# Patient Record
Sex: Male | Born: 1935
Health system: Southern US, Community
[De-identification: ages and names within clinical notes are randomized; demographics above are authoritative.]

## PROBLEM LIST (undated history)

## (undated) ENCOUNTER — Emergency Department (HOSPITAL_COMMUNITY): Payer: PRIVATE HEALTH INSURANCE | Source: Home / Self Care

## (undated) DIAGNOSIS — I519 Heart disease, unspecified: Secondary | ICD-10-CM

## (undated) DIAGNOSIS — I951 Orthostatic hypotension: Secondary | ICD-10-CM

## (undated) DIAGNOSIS — I5189 Other ill-defined heart diseases: Secondary | ICD-10-CM

## (undated) DIAGNOSIS — E7800 Pure hypercholesterolemia, unspecified: Secondary | ICD-10-CM

## (undated) DIAGNOSIS — B353 Tinea pedis: Secondary | ICD-10-CM

## (undated) DIAGNOSIS — Z9689 Presence of other specified functional implants: Secondary | ICD-10-CM

## (undated) DIAGNOSIS — R3915 Urgency of urination: Secondary | ICD-10-CM

## (undated) DIAGNOSIS — L111 Transient acantholytic dermatosis [Grover]: Secondary | ICD-10-CM

## (undated) DIAGNOSIS — D075 Carcinoma in situ of prostate: Secondary | ICD-10-CM

## (undated) DIAGNOSIS — R269 Unspecified abnormalities of gait and mobility: Secondary | ICD-10-CM

## (undated) DIAGNOSIS — I4891 Unspecified atrial fibrillation: Secondary | ICD-10-CM

## (undated) DIAGNOSIS — H409 Unspecified glaucoma: Secondary | ICD-10-CM

## (undated) DIAGNOSIS — R569 Unspecified convulsions: Secondary | ICD-10-CM

## (undated) DIAGNOSIS — G9341 Metabolic encephalopathy: Secondary | ICD-10-CM

## (undated) DIAGNOSIS — R972 Elevated prostate specific antigen [PSA]: Secondary | ICD-10-CM

## (undated) DIAGNOSIS — E785 Hyperlipidemia, unspecified: Secondary | ICD-10-CM

## (undated) DIAGNOSIS — I469 Cardiac arrest, cause unspecified: Secondary | ICD-10-CM

## (undated) DIAGNOSIS — G939 Disorder of brain, unspecified: Secondary | ICD-10-CM

## (undated) DIAGNOSIS — H5440 Blindness, one eye, unspecified eye: Secondary | ICD-10-CM

## (undated) HISTORY — DX: Orthostatic hypotension: I95.1

## (undated) HISTORY — DX: Unspecified atrial fibrillation: I48.91

## (undated) HISTORY — DX: Urgency of urination: R39.15

## (undated) HISTORY — PX: NASAL SINUS SURGERY: SHX719

## (undated) HISTORY — PX: CRANIOTOMY: SHX93

## (undated) HISTORY — PX: OTHER SURGICAL HISTORY: SHX169

## (undated) HISTORY — PX: COLONOSCOPY: SHX174

## (undated) HISTORY — DX: Elevated prostate specific antigen (PSA): R97.20

## (undated) HISTORY — PX: CARDIAC DEFIBRILLATOR PLACEMENT: SHX171

## (undated) HISTORY — DX: Other ill-defined heart diseases: I51.89

## (undated) HISTORY — DX: Tinea pedis: B35.3

## (undated) HISTORY — PX: TONSILLECTOMY: SUR1361

## (undated) HISTORY — DX: Unspecified abnormalities of gait and mobility: R26.9

## (undated) HISTORY — DX: Heart disease, unspecified: I51.9

## (undated) HISTORY — DX: 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grover): L11.1

## (undated) HISTORY — DX: Presence of other specified functional implants: Z96.89

## (undated) HISTORY — DX: Cardiac arrest, cause unspecified: I46.9

## (undated) HISTORY — DX: Carcinoma in situ of prostate: D07.5

## (undated) HISTORY — DX: Hyperlipidemia, unspecified: E78.5

## (undated) HISTORY — DX: Metabolic encephalopathy: G93.41

---
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
## 1983-06-18 HISTORY — PX: VASECTOMY: SHX75

## 2005-03-31 ENCOUNTER — Emergency Department (HOSPITAL_COMMUNITY): Admission: EM | Admit: 2005-03-31 | Discharge: 2005-03-31 | Payer: Self-pay | Admitting: Emergency Medicine

## 2008-10-27 ENCOUNTER — Telehealth (INDEPENDENT_AMBULATORY_CARE_PROVIDER_SITE_OTHER): Payer: Self-pay | Admitting: *Deleted

## 2008-10-28 ENCOUNTER — Ambulatory Visit: Payer: Self-pay

## 2008-10-28 ENCOUNTER — Encounter: Payer: Self-pay | Admitting: Cardiology

## 2010-05-11 ENCOUNTER — Emergency Department (HOSPITAL_COMMUNITY): Admission: EM | Admit: 2010-05-11 | Discharge: 2010-05-11 | Payer: Self-pay | Admitting: Family Medicine

## 2010-08-01 ENCOUNTER — Institutional Professional Consult (permissible substitution): Payer: Self-pay | Admitting: Pediatrics

## 2010-11-02 NOTE — Consult Note (Signed)
NAMEEZEL, VALLONE NO.:  [id number]   MEDICAL RECORD NO.:  [id number]          PATIENT TYPE:  EMS   LOCATION:  MAJO                         FACILITY:  [facility]   PHYSICIAN:  Melvyn Novas, M.D.  DATE OF BIRTH:  05/22/1936   DATE OF CONSULTATION:  03/31/2005  DATE OF DISCHARGE:                                   CONSULTATION   Mr. Halford Goetzke is a 75 year old Caucasian, right-handed gentleman, living in  [place], who also has a secondary residence here in [place].  This  morning he was exercising with two of his friends, Dr. Glennon Hamilton and Dr.  Eloise Harman, and suddenly became confused and acted not quite himself.  He was  also diaphoretic, and the confusional spell was described as lasting 20 to  30 minutes.  There was no convulsion observed, no loss of consciousness, no  incontinence, and no tongue bite.  Mr. Homewood relatives were able to pick  him up and brought him to the ER after talking to the two physician friends  that highly recommended a brief workup.   Here in the ER, he received a CT scan of the head which shows an apparently  not new lesion in the right frontal lobe.  This lesion was diagnosed 12  years ago, and according to the patient, has been followed at [facility] in [place] by Dr. Darliss Cheney and a neurosurgeon, Dr. Riley Kill.  He is currently in the process of being evaluated for seizure surgery.  There is no edema and no evidence of any acute anatomical changes seen on  this CT scan.  The patient further had a CHEM-7 drawn which was normal.  Creatinine 0.9, sodium 136, potassium 4.9.  A phenytoin level came back, and  the patient, who had taken Dilantin around 8 a.m., and his blood was taken  about 3 hours later, had a blood level of 27.4 mcg/mL which is high.  He  then recalled that at least once before he had seizures when his phenytoin  level reached 30.   PHYSICAL EXAMINATION:  The patient is alert and oriented now.  He  does,  however, insist that he wants to fly to [facility], home, at 5 p.m. today  which I strongly discouraged.  He will fly in steps tomorrow and tonight we  will make a short adjustment in his medications.  He has apparently taken  for a couple of days extra Dilantin since he senses that seizures occur more  often when he flies.  He, therefore, prepped himself for the stress coming  with the air travel and might have actually overdosed in the last 3 days.  He is currently, he states, on 2 Keppra tablets in the morning and at night.  He is not sure about the absolute dose.  He talked about blue Keppra tablets  that he takes 2 of twice daily which might be a 500 mg dose twice a day.  He  takes Dilantin 200 mg and morning and 250 mg at night, and he takes Vitorin.  He  is on no other medications.   FAMILY HISTORY:  Negative for seizures, degenerative disease, and vascular  diseases.   SOCIAL HISTORY:  The patient is a nonsmoker, nondrinker.   ASSESSMENT:  The patient's cranial nerve examination is normal.  He has full  extraocular movements.  He had a brief nystagmus with gaze to the right of 3  beats, but this is an end-point nystagmus.  He has full visual fields  bilaterally to simultaneous stimulation.  Facial droop is not seen.  There  is symmetric facial strength.  Tongue and uvula midline.  Range of motion  for the neck is full, and the patient has no tenderness over head and skull.  As to his motor examination, he has equal strength bilaterally.  His left  reflexes seem to be slightly brisker than the deep tendon reflexes on the  right, but he has bilaterally downgoing toes to plantar stimulation.  His  primary sensory for fine touch, pinprick, and vibration was intact.  He  could also feel the cold metal.   Gait and finger-to-nose test are intact.  He shows no tremor nor ataxia nor  dysmetria.   The patient might have suffered a frontal lobe complex partial seizure this   morning, and this event is apparently not unusual for him.  He has a known  underlying brain lesion.  He is in the processs of being evaluated by his  [facility] physicians, and I encouraged him to fly back tomorrow instead  of today.  I would like him to continue his Keppra regimen.  His Dilantin I  will ask him to take today at only 200 mg at night, and he will resume 200  mg in the morning.  I will be happy to provide a copy of today's report, the  lab reports, and CD ROM of his head CT.           ______________________________  Melvyn Novas, M.D.     CD/MEDQ  D:  03/31/2005  T:  03/31/2005  Job:  [id number]   cc:   Genene Churn. Love, M.D.  Fax: [phone]   E. Graceann Congress, M.D.  [address]  [address]  [place]  [place] [address]   Darliss Cheney, M.D.  [facility]  [facility]  [place], [place]

## 2011-12-04 DIAGNOSIS — M8580 Other specified disorders of bone density and structure, unspecified site: Secondary | ICD-10-CM | POA: Insufficient documentation

## 2011-12-04 DIAGNOSIS — E559 Vitamin D deficiency, unspecified: Secondary | ICD-10-CM | POA: Insufficient documentation

## 2013-04-10 ENCOUNTER — Emergency Department (HOSPITAL_COMMUNITY): Payer: Medicare Other

## 2013-04-10 ENCOUNTER — Inpatient Hospital Stay (HOSPITAL_COMMUNITY)
Admission: EM | Admit: 2013-04-10 | Discharge: 2013-04-11 | DRG: 101 | Disposition: A | Discharge status: Home / Self Care | Payer: Medicare Other | Attending: Infectious Diseases | Admitting: Infectious Diseases

## 2013-04-10 ENCOUNTER — Encounter (HOSPITAL_COMMUNITY): Payer: Self-pay | Admitting: Emergency Medicine

## 2013-04-10 ENCOUNTER — Inpatient Hospital Stay (HOSPITAL_COMMUNITY)
Admit: 2013-04-10 | Discharge: 2013-04-10 | Disposition: A | Discharge status: Home / Self Care | Payer: Medicare Other | Attending: Neurology | Admitting: Neurology

## 2013-04-10 DIAGNOSIS — R569 Unspecified convulsions: Secondary | ICD-10-CM

## 2013-04-10 DIAGNOSIS — G40209 Localization-related (focal) (partial) symptomatic epilepsy and epileptic syndromes with complex partial seizures, not intractable, without status epilepticus: Secondary | ICD-10-CM | POA: Diagnosis present

## 2013-04-10 DIAGNOSIS — E785 Hyperlipidemia, unspecified: Secondary | ICD-10-CM | POA: Diagnosis present

## 2013-04-10 DIAGNOSIS — Z79899 Other long term (current) drug therapy: Secondary | ICD-10-CM

## 2013-04-10 DIAGNOSIS — G40909 Epilepsy, unspecified, not intractable, without status epilepticus: Principal | ICD-10-CM | POA: Diagnosis present

## 2013-04-10 DIAGNOSIS — H409 Unspecified glaucoma: Secondary | ICD-10-CM | POA: Diagnosis present

## 2013-04-10 DIAGNOSIS — Z87891 Personal history of nicotine dependence: Secondary | ICD-10-CM

## 2013-04-10 DIAGNOSIS — G40901 Epilepsy, unspecified, not intractable, with status epilepticus: Secondary | ICD-10-CM

## 2013-04-10 HISTORY — DX: Blindness, one eye, unspecified eye: H54.40

## 2013-04-10 HISTORY — DX: Disorder of brain, unspecified: G93.9

## 2013-04-10 HISTORY — DX: Pure hypercholesterolemia, unspecified: E78.00

## 2013-04-10 HISTORY — DX: Unspecified glaucoma: H40.9

## 2013-04-10 HISTORY — DX: Unspecified convulsions: R56.9

## 2013-04-10 LAB — BASIC METABOLIC PANEL
BUN: 16 mg/dL (ref 6–23)
CO2: 22 mEq/L (ref 19–32)
Calcium: 9.2 mg/dL (ref 8.4–10.5)
Chloride: 101 mEq/L (ref 96–112)
Creatinine, Ser: 0.87 mg/dL (ref 0.50–1.35)
GFR calc Af Amer: >90 mL/min (ref >90)
GFR calc non Af Amer: 82 mL/min — ABNORMAL LOW (ref >90)
Glucose, Bld: 103 mg/dL — ABNORMAL HIGH (ref 70–99)
Potassium: 4.7 mEq/L (ref 3.5–5.1)
Sodium: 138 mEq/L (ref 135–145)

## 2013-04-10 LAB — GLUCOSE, CAPILLARY: Glucose-Capillary: 96 mg/dL (ref 70–99)

## 2013-04-10 LAB — CBC
HCT: 37.5 % — ABNORMAL LOW (ref 39.0–52.0)
Hemoglobin: 13.8 g/dL (ref 13.0–17.0)
MCHC: 36.8 g/dL — ABNORMAL HIGH (ref 30.0–36.0)
MCV: 90.6 fL (ref 78.0–100.0)
RBC: 4.14 MIL/uL — ABNORMAL LOW (ref 4.22–5.81)
RDW: 12.3 % (ref 11.5–15.5)

## 2013-04-10 LAB — URINALYSIS, ROUTINE W REFLEX MICROSCOPIC
Bilirubin Urine: NEGATIVE
Glucose, UA: NEGATIVE mg/dL
Hgb urine dipstick: NEGATIVE
Ketones, ur: NEGATIVE mg/dL
Leukocytes, UA: NEGATIVE
Nitrite: NEGATIVE
Protein, ur: NEGATIVE mg/dL
Specific Gravity, Urine: 1.021 (ref 1.005–1.030)
Urobilinogen, UA: 0.2 mg/dL (ref 0.0–1.0)
pH: 6 (ref 5.0–8.0)

## 2013-04-10 LAB — CBC WITH DIFFERENTIAL/PLATELET
Basophils Absolute: 0.1 10*3/uL (ref 0.0–0.1)
Basophils Relative: 1 % (ref 0–1)
Eosinophils Absolute: 0.5 10*3/uL (ref 0.0–0.7)
Eosinophils Relative: 6 % — ABNORMAL HIGH (ref 0–5)
HCT: 40.9 % (ref 39.0–52.0)
Hemoglobin: 14.8 g/dL (ref 13.0–17.0)
Lymphocytes Relative: 49 % — ABNORMAL HIGH (ref 12–46)
Lymphs Abs: 4.4 10*3/uL — ABNORMAL HIGH (ref 0.7–4.0)
MCH: 32.7 pg (ref 26.0–34.0)
MCHC: 36.2 g/dL — ABNORMAL HIGH (ref 30.0–36.0)
MCV: 90.5 fL (ref 78.0–100.0)
Monocytes Absolute: 1.1 10*3/uL — ABNORMAL HIGH (ref 0.1–1.0)
Monocytes Relative: 12 % (ref 3–12)
Neutro Abs: 3 10*3/uL (ref 1.7–7.7)
Neutrophils Relative %: 33 % — ABNORMAL LOW (ref 43–77)
Platelets: 189 10*3/uL (ref 150–400)
RBC: 4.52 MIL/uL (ref 4.22–5.81)
RDW: 12.3 % (ref 11.5–15.5)
WBC: 9.1 10*3/uL (ref 4.0–10.5)

## 2013-04-10 LAB — CREATININE, SERUM
GFR calc Af Amer: >90 mL/min (ref >90)
GFR calc non Af Amer: 90 mL/min — ABNORMAL LOW (ref >90)

## 2013-04-10 LAB — PHENYTOIN LEVEL, TOTAL: Phenytoin Lvl: 13.5 ug/mL (ref 10.0–20.0)

## 2013-04-10 LAB — RAPID URINE DRUG SCREEN, HOSP PERFORMED
Amphetamines: NOT DETECTED
Barbiturates: NOT DETECTED
Benzodiazepines: POSITIVE — AB
Cocaine: NOT DETECTED
Opiates: NOT DETECTED
Tetrahydrocannabinol: NOT DETECTED

## 2013-04-10 LAB — MAGNESIUM: Magnesium: 2.1 mg/dL (ref 1.5–2.5)

## 2013-04-10 MED ORDER — ALPRAZOLAM 0.5 MG PO TABS
0.5000 mg | ORAL_TABLET | Freq: Two times a day (BID) | ORAL | Status: DC
  Administered 2013-04-10 – 2013-04-11 (×2): 0.5 mg via ORAL
  Filled 2013-04-10 (×2): qty 1

## 2013-04-10 MED ORDER — FINASTERIDE 5 MG PO TABS
5.0000 mg | ORAL_TABLET | Freq: Every day | ORAL | Status: DC
  Administered 2013-04-10: 5 mg via ORAL
  Filled 2013-04-10 (×2): qty 1

## 2013-04-10 MED ORDER — LEVETIRACETAM 750 MG PO TABS
1500.0000 mg | ORAL_TABLET | Freq: Two times a day (BID) | ORAL | Status: DC
  Administered 2013-04-11: 1500 mg via ORAL
  Filled 2013-04-10 (×3): qty 2

## 2013-04-10 MED ORDER — LACOSAMIDE 200 MG PO TABS
100.0000 mg | ORAL_TABLET | Freq: Two times a day (BID) | ORAL | Status: DC
  Administered 2013-04-11: 100 mg via ORAL
  Filled 2013-04-10: qty 1

## 2013-04-10 MED ORDER — LATANOPROST 0.005 % OP SOLN
1.0000 [drp] | Freq: Every day | OPHTHALMIC | Status: DC
  Filled 2013-04-10: qty 2.5

## 2013-04-10 MED ORDER — BRIMONIDINE TARTRATE 0.2 % OP SOLN
1.0000 [drp] | Freq: Two times a day (BID) | OPHTHALMIC | Status: DC
  Administered 2013-04-10 – 2013-04-11 (×3): 1 [drp] via OPHTHALMIC
  Filled 2013-04-10: qty 5

## 2013-04-10 MED ORDER — SODIUM CHLORIDE 0.9 % IV BOLUS (SEPSIS)
1000.0000 mL | Freq: Once | INTRAVENOUS | Status: AC
  Administered 2013-04-10: 1000 mL via INTRAVENOUS

## 2013-04-10 MED ORDER — SODIUM CHLORIDE 0.9 % IV SOLN
200.0000 mg | Freq: Once | INTRAVENOUS | Status: DC
  Filled 2013-04-10: qty 20

## 2013-04-10 MED ORDER — ATORVASTATIN CALCIUM 40 MG PO TABS
40.0000 mg | ORAL_TABLET | Freq: Every day | ORAL | Status: DC
  Administered 2013-04-10: 40 mg via ORAL
  Filled 2013-04-10 (×2): qty 1

## 2013-04-10 MED ORDER — ACETAMINOPHEN 325 MG PO TABS: 650.0000 mg | ORAL_TABLET | Freq: Four times a day (QID) | ORAL | Status: DC | PRN

## 2013-04-10 MED ORDER — ENOXAPARIN SODIUM 40 MG/0.4ML ~~LOC~~ SOLN
40.0000 mg | SUBCUTANEOUS | Status: DC
  Administered 2013-04-10: 40 mg via SUBCUTANEOUS
  Filled 2013-04-10 (×2): qty 0.4

## 2013-04-10 MED ORDER — LORAZEPAM 2 MG/ML IJ SOLN
INTRAMUSCULAR | Status: AC
  Administered 2013-04-10: 2 mg
  Filled 2013-04-10: qty 2

## 2013-04-10 MED ORDER — SODIUM CHLORIDE 0.9 % IJ SOLN
3.0000 mL | Freq: Two times a day (BID) | INTRAMUSCULAR | Status: DC
  Administered 2013-04-10 – 2013-04-11 (×2): 3 mL via INTRAVENOUS

## 2013-04-10 MED ORDER — ACETAMINOPHEN 650 MG RE SUPP: 650.0000 mg | Freq: Four times a day (QID) | RECTAL | Status: DC | PRN

## 2013-04-10 MED ORDER — BRIMONIDINE TARTRATE-TIMOLOL 0.2-0.5 % OP SOLN: 1.0000 [drp] | Freq: Two times a day (BID) | OPHTHALMIC | Status: DC

## 2013-04-10 MED ORDER — SODIUM CHLORIDE 0.9 % IV SOLN
1500.0000 mg | Freq: Two times a day (BID) | INTRAVENOUS | Status: DC
  Administered 2013-04-10: 1500 mg via INTRAVENOUS
  Filled 2013-04-10 (×2): qty 15

## 2013-04-10 MED ORDER — SODIUM CHLORIDE 0.9 % IV SOLN
200.0000 mg | Freq: Two times a day (BID) | INTRAVENOUS | Status: DC
  Administered 2013-04-10: 200 mg via INTRAVENOUS
  Filled 2013-04-10 (×2): qty 4

## 2013-04-10 MED ORDER — LORAZEPAM 2 MG/ML IJ SOLN
1.0000 mg | Freq: Once | INTRAMUSCULAR | Status: AC
  Administered 2013-04-10: 1 mg via INTRAVENOUS
  Filled 2013-04-10: qty 1

## 2013-04-10 MED ORDER — TIMOLOL MALEATE 0.5 % OP SOLN
1.0000 [drp] | Freq: Two times a day (BID) | OPHTHALMIC | Status: DC
  Administered 2013-04-10 – 2013-04-11 (×3): 1 [drp] via OPHTHALMIC
  Filled 2013-04-10: qty 5

## 2013-04-10 MED ORDER — SODIUM CHLORIDE 0.9 % IV SOLN
400.0000 mg | Freq: Once | INTRAVENOUS | Status: AC
  Administered 2013-04-10: 400 mg via INTRAVENOUS
  Filled 2013-04-10: qty 40

## 2013-04-10 MED ORDER — PHENYTOIN SODIUM EXTENDED 100 MG PO CAPS
200.0000 mg | ORAL_CAPSULE | Freq: Two times a day (BID) | ORAL | Status: DC
  Administered 2013-04-11: 200 mg via ORAL
  Filled 2013-04-10 (×3): qty 2

## 2013-04-10 NOTE — ED Notes (Signed)
MD at bedside. 

## 2013-04-10 NOTE — ED Notes (Signed)
Per EMS: pt coming from home with c/o seizure. Upon EMS arrival pt was A&O, while en route pt began to seize, deviating to left, seizure last approximately 3 min. IV was started in left hand, pt was given 2 mg of Ativan. Seizure stopped once ativan was given. Pt is now post-icle. Pt has hx of right frontal lobe lesion.

## 2013-04-10 NOTE — Progress Notes (Signed)
Addendum: Patient's mental status improving.  Less lethargic and able to give extensive details about his history.  No focal clonic activity noted.   EEG performed and shows continuous focal sharp activity.  Patient given 1mg  of Ativan and loaded with Vimpat at 400mg . Patient to have continuous EEG monitoring.  After review will determine maintenance Vimpat dosage.    Thana Farr, MD [facility] [phone]

## 2013-04-10 NOTE — ED Notes (Signed)
Neurology at bedside.

## 2013-04-10 NOTE — H&P (Signed)
Date: 04/10/2013               Patient Name:  Cameron Potts MRN: [id number]  DOB: 04-Mar-1936 Age / Sex: 77 y.o., male   PCP: Dr. Antionette Poles [facility] [place])         Medical Service: Internal Medicine Teaching Service         Attending Physician: Dr. Johny Sax, MD    First Contact: Dr. Carlynn Purl Pager: [phone]  Second Contact: Dr. Darden Palmer Pager: [phone]       After Hours (After 5p/  First Contact Pager: [phone]  weekends / holidays): Second Contact Pager: [phone]   Chief Complaint: Seizure  History of Present Illness: Cameron Potts is a 77 year old male with a PMH of complex seizures, glaucoma, HLD.  He is accompanied by his wife who provides the majority of the history. Mr. Sames has had complex seizures for many years, these were attributed to a right frontal lesion which was resected 8 years ago.  He has been managed by Dr. Kizzie Ide at [facility] in [place], [place].  Dr. Kizzie Ide has titrated his seizure medications up to 1500mg  of Keppra BID and 200mg  of Dilantin BID.  He has apparently not always been compliant with his medications but his wife reports he has been very compliant since his last seizure a year and a half ago.  She also reports that an MRI done at that time showed no residual lesion. This morning his wife reports that Mr. Feltus stated that his left arm felt numb and that he believed a seizure was coming.  She reported he then seemed to stair off to the left and his left side became contracted.  He was brought the the ED where he was noted to have a seizure manifested by left arm and leg rigidity.  He was given 2mg  IV ativan and was noted to be in a post ictal state.  Wife reports this seizure onset and manifestations are consistent with previous seizures and patient frequently has psychosis for a few days after a seizure.  Neurology was consulted in the ED and requested to have patient admitted.   Meds: Current Facility-Administered Medications  Medication  Dose Route Frequency Provider Last Rate Last Dose  . acetaminophen (TYLENOL) tablet 650 mg  650 mg Oral Q6H PRN Darden Palmer, MD       Or  . acetaminophen (TYLENOL) suppository 650 mg  650 mg Rectal Q6H PRN Darden Palmer, MD      . atorvastatin (LIPITOR) tablet 40 mg  40 mg Oral q1800 Darden Palmer, MD      . brimonidine (ALPHAGAN) 0.2 % ophthalmic solution 1 drop  1 drop Both Eyes BID Jonah Blue, DO       And  . timolol (TIMOPTIC) 0.5 % ophthalmic solution 1 drop  1 drop Both Eyes BID Jonah Blue, DO      . enoxaparin (LOVENOX) injection 40 mg  40 mg Subcutaneous Q24H Darden Palmer, MD      . latanoprost (XALATAN) 0.005 % ophthalmic solution 1 drop  1 drop Both Eyes QHS Darden Palmer, MD      . levETIRAcetam (KEPPRA) 1,500 mg in sodium chloride 0.9 % 100 mL IVPB  1,500 mg Intravenous Q12H Carlynn Purl, DO      . phenytoin (DILANTIN) 200 mg in sodium chloride 0.9 % 100 mL IVPB  200 mg Intravenous BID Carlynn Purl, DO      . sodium chloride 0.9 % injection 3 mL  3 mL Intravenous Q12H Darden Palmer, MD        Allergies: Allergies as of 04/10/2013  . (No Known Allergies)   Past Medical History  Diagnosis Date  . Glaucoma   . Hypercholesteremia   . Seizures   . Right frontal lobe lesion    History reviewed. No pertinent past surgical history. History reviewed. No pertinent family history. History   Social History  . Marital Status: Single    Spouse Name: N/A    Number of Children: N/A  . Years of Education: N/A   Occupational History  . Not on file.   Social History Main Topics  . Smoking status: Former Games developer  . Smokeless tobacco: Not on file  . Alcohol Use: Yes  . Drug Use: No  . Sexual Activity: Not on file   Other Topics Concern  . Not on file   Social History Narrative  . No narrative on file    Review of Systems:per wife Review of Systems  Constitutional: Negative for fever and chills.  HENT: Negative for hearing loss and tinnitus.   Eyes:  Negative for double vision and photophobia.       Left side peripheral vision loss secondary to glaucoma  Respiratory: Negative for cough and shortness of breath.   Cardiovascular: Negative for chest pain and palpitations.  Gastrointestinal: Negative for nausea, vomiting, abdominal pain, diarrhea and constipation.  Genitourinary: Negative for dysuria and urgency.  Musculoskeletal: Negative for falls and myalgias.  Neurological: Positive for sensory change and seizures. Negative for dizziness, speech change, loss of consciousness and headaches.  Psychiatric/Behavioral: Negative for substance abuse.     Physical Exam: Blood pressure 131/51, pulse 53, resp. rate 16, SpO2 100.00%. Physical Exam  Vitals reviewed. Constitutional: No distress.  Lethargic, possible post ictal state, able to follow many commands  HENT:  EEG/Bandage applied to scalp  Eyes:  No obvious deficit in EOM Eyes PERRL, left eye more sluggish response to light. Decreased vision on left.  Cardiovascular: Normal rate, regular rhythm, normal heart sounds and intact distal pulses.   No murmur heard. Pulmonary/Chest: Effort normal and breath sounds normal. No respiratory distress. He has no wheezes.  Abdominal: Soft. Bowel sounds are normal. He exhibits no distension. There is no tenderness. There is no rebound.  Musculoskeletal: He exhibits no edema.  Neurological: He has intact cranial nerves. He is not agitated. He displays facial symmetry and normal speech. He displays no Babinski's sign on the right side. He displays no Babinski's sign on the left side.  Difficult to preform cerebellar testing, patient not able to fully cooperate with commands. No obvious CN deficit, other than decreased vision on left eye. Patient has decreased sensation on left arm, 5/5 grip strength B/L. Able to lift leg B/L  Skin: He is not diaphoretic.    Lab results: Basic Metabolic Panel:  Recent Labs  16/10/96 0525  NA 138  K 4.7  CL  101  CO2 22  GLUCOSE 103*  BUN 16  CREATININE 0.87  CALCIUM 9.2  MG 2.1   CBC:  Recent Labs  04/10/13 0525  WBC 9.1  NEUTROABS 3.0  HGB 14.8  HCT 40.9  MCV 90.5  PLT 189   CBG:  Recent Labs  04/10/13 0540  GLUCAP 96   Urine Drug Screen: Drugs of Abuse     Component Value Date/Time   LABOPIA NONE DETECTED 04/10/2013 0910   COCAINSCRNUR NONE DETECTED 04/10/2013 0910   LABBENZ POSITIVE* 04/10/2013 0910   AMPHETMU NONE  DETECTED 04/10/2013 0910   THCU NONE DETECTED 04/10/2013 0910   LABBARB NONE DETECTED 04/10/2013 0910    Urinalysis:  Recent Labs  04/10/13 0910  COLORURINE YELLOW  LABSPEC 1.021  PHURINE 6.0  GLUCOSEU NEGATIVE  HGBUR NEGATIVE  BILIRUBINUR NEGATIVE  KETONESUR NEGATIVE  PROTEINUR NEGATIVE  UROBILINOGEN 0.2  NITRITE NEGATIVE  LEUKOCYTESUR NEGATIVE    Imaging results:  No results found.  Other results: EKG: normal EKG, normal sinus rhythm, rate of 76, Normal Axis, No ST, T wave changes, no EKG available for comparision.  Assessment & Plan by Problem: #  Complex Focal Seizure Patient has a reported long history of complex seizures involving left side of body.  These were attributed to a right frontal lesion that was resected 8 years ago.  He has been followed at [facility] in [place], [place] by Dr. Kizzie Ide, he has previously been noncompliant with medications leading to seizure but wife reports very strict compliance over the past year and a half since his last seizure.  An MRI last year was reported to be negative for recurrence of lesion.  -Given 2mg  Ativan in ED, per Neurology still seizure activity on EEG, additional 1mg  Ativan given. -Admit to Stepdown, Inpatient, Neuro checks Q2, Seizure preacutions -Overnight Video EEG monitoring -Continue Keppra 1500mg  BID, change to IV. -Continue Dilantin 200mg  BID, change to IV. - Start Vimpat per Neurology recommendations. - Neurology following -SLP bedside swallow eval. # Glaucoma -Continue  home eye drops # Hyperlipidemia  -Atorvastatin 40mg , hospital formulary alternative to crestor 20mg .  DVT PPx: Lovenox Dispo: Disposition is deferred at this time, awaiting improvement of current medical problems. Anticipated discharge in approximately 2 day(s).   The patient does have a current PCP (Dr. Antionette Poles) and does not need an [facility] hospital follow-up appointment after discharge.  The patient does not have transportation limitations that hinder transportation to clinic appointments.  Signed: Carlynn Purl, DO 04/10/2013, 12:10 PM

## 2013-04-10 NOTE — Evaluation (Signed)
Clinical/Bedside Swallow Evaluation Patient Details  Name: Cameron Potts MRN: [id number] Date of Birth: Jul 21, 1935  Today's Date: 04/10/2013 Time: 1230-1300 SLP Time Calculation (min): 30 min  Past Medical History:  Past Medical History  Diagnosis Date  . Glaucoma   . Hypercholesteremia   . Seizures   . Right frontal lobe lesion    Past Surgical History: History reviewed. No pertinent past surgical history. HPI:  Cameron Potts is a 77 year old male with a PMH of complex seizures, glaucoma, HLD.  He is accompanied by his wife who provides the majority of the history. Cameron Potts has had complex seizures for many years, these were attributed to a right frontal lesion which was resected 8 years ago. He has been managed by Dr. Kizzie Ide at [facility] in [place], [place]. Dr. Kizzie Ide has titrated his seizure medications up to 1500mg  of Keppra BID and 200mg  of Dilantin BID. He has apparently not always been compliant with his medications but his wife reports he has been very compliant since his last seizure a year and a half ago. She also reports that an MRI done at that time showed no residual lesion.  This morning his wife reports that Cameron Potts stated that his left arm felt numb and that he believed a seizure was coming. She reported he then seemed to stair off to the left and his left side became contracted. He was brought the the ED where he was noted to have a seizure manifested by left arm and leg rigidity. He was given 2mg  IV ativan and was noted to be in a post ictal state. Wife reports this seizure onset and manifestations are consistent with previous seizures and patient frequently has psychosis for a few days after a seizure. Neurology was consulted in the ED and requested to have patient admitted.  BSE ordered to assess risk for aspiration.      Assessment / Plan / Recommendation Clinical Impression  BSE completed.  No outward clinical s/s of aspiration noted throughout evaluation.  Proceed with  regular consistency and thin liquids with full supervision with all PO' s due to cognitive status and vision deficits.  ST to sign off as no evidence of dysphagia.  Education complete.     Aspiration Risk  Mild    Diet Recommendation Regular;Thin liquid   Liquid Administration via: Cup;Straw Medication Administration: Whole meds with liquid Supervision: Patient able to self feed;Full supervision/cueing for compensatory strategies    Other  Recommendations Oral Care Recommendations: Oral care BID Other Recommendations: Clarify dietary restrictions   Follow Up Recommendations  None         Swallow Study Prior Functional Status   Lives at home with spouse     General Date of Onset: 04/10/13 HPI: Cameron Potts is a 77 year old male with a PMH of complex seizures, glaucoma, HLD.  He is accompanied by his wife who provides the majority of the history. Type of Study: Bedside swallow evaluation Diet Prior to this Study: NPO Temperature Spikes Noted: No Respiratory Status: Room air History of Recent Intubation: No Behavior/Cognition: Alert;Impulsive;Cooperative;Distractible;Requires cueing Oral Cavity - Dentition: Adequate natural dentition Self-Feeding Abilities: Able to feed self;Needs assist Patient Positioning: Upright in bed Baseline Vocal Quality: Clear Volitional Cough: Strong Volitional Swallow: Able to elicit    Oral/Motor/Sensory Function Overall Oral Motor/Sensory Function: Appears within functional limits for tasks assessed   Ice Chips Ice chips: Not tested   Thin Liquid Thin Liquid: Within functional limits Presentation: Cup;Straw    Nectar  Thick Nectar Thick Liquid: Not tested   Honey Thick Honey Thick Liquid: Not tested   Puree Puree: Within functional limits   Solid   GO    Solid: Within functional limits      Moreen Fowler MS, CCC-SLP [phone] [facility] 04/10/2013,1:29 PM

## 2013-04-10 NOTE — Progress Notes (Signed)
EEG Completed; Results Pending  

## 2013-04-10 NOTE — ED Notes (Signed)
Brought pt back from EEG.

## 2013-04-10 NOTE — Consult Note (Addendum)
NEURO HOSPITALIST CONSULT NOTE    Reason for Consult: seizures  HPI:                                                                                                                                          Cameron Potts is an 77 y.o. male, right handed, with a past medical history significant for hypercholesterolemia, glaucoma, s/p removal of benign right frontal " lesion" several years ago, and symptomatic partial seizures typically involving the left side, non convulsive focal SE last August, brought to [facility] ED after having a seizure at home earlier this morning. He takes keppra 3,000 mg and dilantin 400 mg respectively, and has been very adherent to treatment in the last copleof years. His wife said that this morning while in bed he complained of his right hand being " dead" and later on expressed that he was having the feeling of a seizure coming on. Ambulance was called and while in he ED he had a witnessed seizure characterized by rigidity and tonic movements of the left arm with forced head version to the left. He received 2 mg IV ativan and hasn't had further seizures ever since.  At this moment he seems to very sedated and is able to open his eyes and answers some questions.  No recent fever, infection, sleep deprivation, head trauma, or alcohol intake/illicit drugs use. Denies HA, vertigo, double vision, difficulty swallowing, or vision disturbances. CT head unremarkable for acute abnormality.   Past Medical History  Diagnosis Date  . Glaucoma   . Hypercholesteremia   . Seizures   . Right frontal lobe lesion     History reviewed. No pertinent past surgical history.  History reviewed. No pertinent family history.   Social History:  reports that he has quit smoking. He does not have any smokeless tobacco history on file. He reports that he drinks alcohol. He reports that he does not use illicit drugs.  No Known Allergies  MEDICATIONS:                                                                                                                      I have reviewed the patient's current medications.   ROS:  History obtained from chart review, and wife.  General ROS: negative for - chills, fatigue, fever, night sweats, weight gain or weight loss Psychological ROS: negative for - behavioral disorder, hallucinations, memory difficulties, mood swings or suicidal ideation Ophthalmic ROS: negative for - blurry vision, double vision, eye pain or loss of vision ENT ROS: negative for - epistaxis, nasal discharge, oral lesions, sore throat, tinnitus or vertigo Allergy and Immunology ROS: negative for - hives or itchy/watery eyes Hematological and Lymphatic ROS: negative for - bleeding problems, bruising or swollen lymph nodes Endocrine ROS: negative for - galactorrhea, hair pattern changes, polydipsia/polyuria or temperature intolerance Respiratory ROS: negative for - cough, hemoptysis, shortness of breath or wheezing Cardiovascular ROS: negative for - chest pain, dyspnea on exertion, edema or irregular heartbeat Gastrointestinal ROS: negative for - abdominal pain, diarrhea, hematemesis, nausea/vomiting or stool incontinence Genito-Urinary ROS: negative for - dysuria, hematuria, incontinence or urinary frequency/urgency Musculoskeletal ROS: negative for - joint swelling Neurological ROS: as noted in HPI Dermatological ROS: negative for rash and skin lesion changes   Physical exam: pleasant male in no apparent distress. Blood pressure 128/59, pulse 63, resp. rate 14, SpO2 100.00%. Head: normocephalic. Neck: supple, no bruits, no JVD. Cardiac: no murmurs. Lungs: clear. Abdomen: soft, no tender, no mass. Extremities: no edema.   Neurologic Examination:                                                                                                       Mental Status: Sedated but able to open eyes and answer questions. Speech fluent without evidence of aphasia.  Cranial Nerves: II: Discs flat bilaterally; Visual fields grossly normal, pupils equal, round, reactive to light and accommodation III,IV, VI: ptosis not present, extra-ocular motions intact bilaterally V,VII: smile symmetric, facial light touch sensation normal bilaterally VIII: hearing normal bilaterally IX,X: gag reflex present XI: bilateral shoulder shrug XII: midline tongue extension without atrophy or fasciculations  Motor: Tends to move the right side better than the left. Tone and bulk:normal tone throughout; no atrophy noted Sensory: Pinprick and light touch intact throughout, bilaterally Deep Tendon Reflexes:  Right: Upper Extremity   Left: Upper extremity   biceps (C-5 to C-6) 2/4   biceps (C-5 to C-6) 2/4 tricep (C7) 2/4    triceps (C7) 2/4 Brachioradialis (C6) 2/4  Brachioradialis (C6) 2/4  Lower Extremity Lower Extremity  quadriceps (L-2 to L-4) 2/4   quadriceps (L-2 to L-4) 2/4 Achilles (S1) 2/4   Achilles (S1) 2/4  Plantars: Right: downgoing   Left: downgoing Cerebellar: Unable to test. Gait:  Unable to test CV: pulses palpable throughout    No results found for this basename: cbc, bmp, coags, chol, tri, ldl, hga1c    Results for orders placed during the hospital encounter of 04/10/13 (from the past 48 hour(s))  CBC WITH DIFFERENTIAL     Status: Abnormal   Collection Time    04/10/13  5:25 AM      Result Value Range   WBC 9.1  4.0 - 10.5 K/uL   RBC 4.52  4.22 - 5.81 MIL/uL  Hemoglobin 14.8  13.0 - 17.0 g/dL   HCT 16.1  09.6 - 04.5 %   MCV 90.5  78.0 - 100.0 fL   MCH 32.7  26.0 - 34.0 pg   MCHC 36.2 (*) 30.0 - 36.0 g/dL   RDW 40.9  81.1 - 91.4 %   Platelets 189  150 - 400 K/uL   Neutrophils Relative % 33 (*) 43 - 77 %   Neutro Abs 3.0  1.7 - 7.7 K/uL   Lymphocytes Relative 49 (*) 12 - 46 %   Lymphs Abs 4.4  (*) 0.7 - 4.0 K/uL   Monocytes Relative 12  3 - 12 %   Monocytes Absolute 1.1 (*) 0.1 - 1.0 K/uL   Eosinophils Relative 6 (*) 0 - 5 %   Eosinophils Absolute 0.5  0.0 - 0.7 K/uL   Basophils Relative 1  0 - 1 %   Basophils Absolute 0.1  0.0 - 0.1 K/uL  BASIC METABOLIC PANEL     Status: Abnormal   Collection Time    04/10/13  5:25 AM      Result Value Range   Sodium 138  135 - 145 mEq/L   Potassium 4.7  3.5 - 5.1 mEq/L   Chloride 101  96 - 112 mEq/L   CO2 22  19 - 32 mEq/L   Glucose, Bld 103 (*) 70 - 99 mg/dL   BUN 16  6 - 23 mg/dL   Creatinine, Ser 7.82  0.50 - 1.35 mg/dL   Calcium 9.2  8.4 - 95.6 mg/dL   GFR calc non Af Amer 82 (*) >90 mL/min   GFR calc Af Amer >90  >90 mL/min   Comment: (NOTE)     The eGFR has been calculated using the CKD EPI equation.     This calculation has not been validated in all clinical situations.     eGFR's persistently <90 mL/min signify possible Chronic Kidney     Disease.  MAGNESIUM     Status: None   Collection Time    04/10/13  5:25 AM      Result Value Range   Magnesium 2.1  1.5 - 2.5 mg/dL  GLUCOSE, CAPILLARY     Status: None   Collection Time    04/10/13  5:40 AM      Result Value Range   Glucose-Capillary 96  70 - 99 mg/dL   Comment 1 Documented in Chart     Comment 2 Notify RN     Comment 3 Confirm Test in Lab      No results found.   Assessment/Plan: 77 y/o with history of symptomatic left focal motor seizures, prior admission last August complicated by non convulsive partial SE, back to the hospital with his habitual left focal motor seizure. He is able to open eyes and follow commands despite receiving IV ativan. Agree that he needs to be admitted to the hospital to exclude the possibility of NCSE (however, I think that this is less likely at this moment as he is answering questions). He on maximal dose keppra and dilantin. Will check dilantin level and maintain current dose of keppra. EEG. No need to repeat MRI as he had MRI  done in August that showed no recurrence of right frontal " lesion".  Wyatt Portela, MD [facility] [phone]  04/10/2013, 6:24 AM

## 2013-04-10 NOTE — Progress Notes (Signed)
Pt to have Vimpat IV given. Heart rate in the 50's with PR interval of .28. Pt is A&O X 4 in no acute distress. Pt states he feels that he is at his baseline. Spoke with MD neuro on call Dr. Leroy Kennedy. He gave order to hold tonight's dose of Vimpat and resume in the morning. Will follow out and continue to monitor pt.

## 2013-04-10 NOTE — ED Provider Notes (Signed)
CSN: [id number]     Arrival date & time 04/10/13  4098 History   First MD Initiated Contact with Patient 04/10/13 0522     Chief Complaint  Patient presents with  . Seizures   (Consider location/radiation/quality/duration/timing/severity/associated sxs/prior Treatment) HPI Comments: Pt with hx of complicated seizures comes in with cc of seizures. LEVEL 5 CAVEAT FOR POST ICTAL STATUS. Pt is on keppra and dilantin for his seizures. Per wife, patient woke up and was complaining of some LUE numbness, and then proceeded to tell her that he thinks he is about to have a seizure. Soon after, he had a left sided gaze and contracture. EMS was called, and he arrived to the ED, per RN, with a left sided deviated gaze and contracted arm with some shaking. No incontinence.  Pt is from [facility] and is visiting one. Wife reports complex hx with seizures in the past and a prolonged post ictal phase and subclinical seizures.  Patient is a 77 y.o. male presenting with seizures. The history is provided by the patient.  Seizures   Past Medical History  Diagnosis Date  . Glaucoma   . Hypercholesteremia   . Seizures   . Right frontal lobe lesion    History reviewed. No pertinent past surgical history. History reviewed. No pertinent family history. History  Substance Use Topics  . Smoking status: Former Games developer  . Smokeless tobacco: Not on file  . Alcohol Use: Yes    Review of Systems  Unable to perform ROS: Mental status change  Neurological: Positive for seizures.    Allergies  Review of patient's allergies indicates no known allergies.  Home Medications   Current Outpatient Rx  Name  Route  Sig  Dispense  Refill  . ALPRAZolam (XANAX) 0.5 MG tablet   Oral   Take 0.5 mg by mouth every evening.         . bimatoprost (LUMIGAN) 0.01 % SOLN   Both Eyes   Place 1 drop into both eyes at bedtime.         . brimonidine-timolol (COMBIGAN) 0.2-0.5 % ophthalmic solution   Both Eyes   Place 1 drop  into both eyes every 12 (twelve) hours.         . levETIRAcetam (KEPPRA) 500 MG tablet   Oral   Take 1,500 mg by mouth every 12 (twelve) hours.         . phenytoin (DILANTIN) 100 MG ER capsule   Oral   Take 200 mg by mouth 2 (two) times daily.         . rosuvastatin (CRESTOR) 20 MG tablet   Oral   Take 20 mg by mouth daily.          BP 135/62  Pulse 58  Resp 10  SpO2 100% Physical Exam  Nursing note and vitals reviewed. Constitutional: He appears well-developed.  HENT:  Head: Normocephalic and atraumatic.  Eyes: Conjunctivae are normal.  Neck: Neck supple.  Cardiovascular: Normal rate.   Pulmonary/Chest: Effort normal and breath sounds normal. No respiratory distress.  Abdominal: Soft. Bowel sounds are normal. He exhibits no distension.  Neurological:  GCS - 1/1/1 - 3, but patient had a gag.    ED Course  Procedures (including critical care time) Labs Review Labs Reviewed  CBC WITH DIFFERENTIAL - Abnormal; Notable for the following:    MCHC 36.2 (*)    Neutrophils Relative % 33 (*)    Lymphocytes Relative 49 (*)    Lymphs Abs 4.4 (*)  Monocytes Absolute 1.1 (*)    Eosinophils Relative 6 (*)    All other components within normal limits  BASIC METABOLIC PANEL - Abnormal; Notable for the following:    Glucose, Bld 103 (*)    GFR calc non Af Amer 82 (*)    All other components within normal limits  MAGNESIUM  GLUCOSE, CAPILLARY  URINALYSIS, ROUTINE W REFLEX MICROSCOPIC  URINE RAPID DRUG SCREEN (HOSP PERFORMED)  PHENYTOIN LEVEL, TOTAL   Imaging Review No results found.  EKG Interpretation     Ventricular Rate:  76 PR Interval:  220 QRS Duration: 82 QT Interval:  389 QTC Calculation: 437 R Axis:   66 Text Interpretation:  Sinus rhythm Prolonged PR interval Borderline repolarization abnormality            MDM  No diagnosis found. DDx: -Seizure disorder -Meningitis -Trauma -ICH -Electrolyte abnormality -Metabolic  derangement -Stroke -Toxin induced seizures -Medication side effects -Hypoxia -Hypoglycemia  Pt with hx of seizure comes in with repeat seizure. Pt has been taking his meds as prescribed, no new changes, and his dilantin is therapeutic. Pt has complex seizure hx, and is post ictal during my eval. Concerns of subclinical seizure vs. prolonged post ictal phase. Pt was doing well until the seizure occurred and it was unprovoked. No concerns for underlying infection, bleed. GCS is low, but he has a gag reflex,. Neuro consulted  7:32 AM Neuro recommends admission. Repeat exam is better - he is confused, but responding to all of my questions properly. Has some left sided weakness.   Derwood Kaplan, MD 04/10/13 [phone]

## 2013-04-10 NOTE — Progress Notes (Signed)
Addendum: EEG reviewed and shows no further continuous sharp activity.  Antiepileptics addressed.  Thana Farr, MD [facility] [phone]

## 2013-04-10 NOTE — ED Notes (Signed)
Family at bedside. 

## 2013-04-10 NOTE — ED Notes (Signed)
Dr. Reynolds at bedside.

## 2013-04-10 NOTE — ED Notes (Signed)
Patient transported to EEG

## 2013-04-11 ENCOUNTER — Encounter (HOSPITAL_COMMUNITY): Payer: Self-pay | Admitting: *Deleted

## 2013-04-11 DIAGNOSIS — G40901 Epilepsy, unspecified, not intractable, with status epilepticus: Secondary | ICD-10-CM

## 2013-04-11 DIAGNOSIS — G40209 Localization-related (focal) (partial) symptomatic epilepsy and epileptic syndromes with complex partial seizures, not intractable, without status epilepticus: Secondary | ICD-10-CM | POA: Diagnosis present

## 2013-04-11 DIAGNOSIS — G40309 Generalized idiopathic epilepsy and epileptic syndromes, not intractable, without status epilepticus: Secondary | ICD-10-CM

## 2013-04-11 LAB — COMPREHENSIVE METABOLIC PANEL
Albumin: 3.6 g/dL (ref 3.5–5.2)
Alkaline Phosphatase: 74 U/L (ref 39–117)
BUN: 8 mg/dL (ref 6–23)
CO2: 24 mEq/L (ref 19–32)
Chloride: 101 mEq/L (ref 96–112)
Creatinine, Ser: 0.69 mg/dL (ref 0.50–1.35)
GFR calc non Af Amer: >90 mL/min (ref >90)
Potassium: 3.8 mEq/L (ref 3.5–5.1)
Sodium: 136 mEq/L (ref 135–145)
Total Bilirubin: 0.3 mg/dL (ref 0.3–1.2)
Total Protein: 6.3 g/dL (ref 6.0–8.3)

## 2013-04-11 MED ORDER — LACOSAMIDE 100 MG PO TABS: 100.0000 mg | ORAL_TABLET | Freq: Two times a day (BID) | ORAL | Status: DC

## 2013-04-11 NOTE — H&P (Signed)
  Date: 04/11/2013  Patient name: Cameron Potts  Medical record number: [id number]  Date of birth: 04-Feb-1936   I have seen and evaluated Cameron Potts and discussed their care with the Residency Team.   Assessment and Plan: I have seen and evaluated the patient as outlined above. I agree with the formulated Assessment and Plan as detailed in the residents' admission note, with the following changes:   Please see my note as well  Cameron Smart, MD 10/26/20149:56 AM

## 2013-04-11 NOTE — Progress Notes (Signed)
LTVM set up 04-10-13

## 2013-04-11 NOTE — Discharge Summary (Signed)
Name: Cameron Potts MRN: [id number] DOB: 1936-05-01 77 y.o. PCP: No primary provider on file. Dr. Antionette Poles  Date of Admission: 04/10/2013  5:21 AM Date of Discharge: 04/11/13 Attending Physician: Dr. Johny Sax Discharge Diagnosis: Principal Problem:   Complex partial seizure Active Problems:   Seizure disorder, nonconvulsive, with status epilepticus  Discharge Medications:   Medication List         ALPRAZolam 0.5 MG tablet  Commonly known as:  XANAX  Take 0.5 mg by mouth every evening.     bimatoprost 0.01 % Soln  Commonly known as:  LUMIGAN  Place 1 drop into both eyes at bedtime.     COMBIGAN 0.2-0.5 % ophthalmic solution  Generic drug:  brimonidine-timolol  Place 1 drop into both eyes every 12 (twelve) hours.     finasteride 5 MG tablet  Commonly known as:  PROSCAR  Take 5 mg by mouth at bedtime.     Lacosamide 100 MG Tabs  Take 1 tablet (100 mg total) by mouth 2 (two) times daily.     levETIRAcetam 500 MG tablet  Commonly known as:  KEPPRA  Take 1,500 mg by mouth every 12 (twelve) hours.     phenytoin 100 MG ER capsule  Commonly known as:  DILANTIN  Take 200 mg by mouth 2 (two) times daily.     rosuvastatin 20 MG tablet  Commonly known as:  CRESTOR  Take 20 mg by mouth daily.        Disposition and follow-up:   Cameron Potts was discharged from [facility] in Good condition.  At the hospital follow up visit please address:  1.  Patient instructed no diving for at least 6 months, Vimpat 100mg  BID added to Keppra and Dilantin.  2.  Labs / imaging needed at time of follow-up: Dilantin level  3.  Pending labs/ test needing follow-up: None  Follow-up Appointments: Follow-up Information   Follow up with Dr. Ignacia Bayley. Schedule an appointment as soon as possible for a visit in 1 week. (please see Dr. Kizzie Ide as soon as possible.)    Contact information:   [address] [place], [place] [address] [phone]      Schedule an appointment as soon as possible for a visit with Dr. Antionette Poles. (when you get home to [place].)       Discharge Instructions: Discharge Orders   Future Orders Complete By Expires   Call MD for:  difficulty breathing, headache or visual disturbances  As directed    Call MD for:  persistant dizziness or light-headedness  As directed    Diet - low sodium heart healthy  As directed    Discharge instructions  As directed    Comments:     Continue taking Keppra and Dilantin as prescribed.  Start taking Vimpat 100mg  twice a day.  Follow up with your Neurologist when you get back to [facility].   Increase activity slowly  As directed       Consultations: Treatment Team:  Kym Groom, MD  Dr. Wyatt Portela and Dr. Thana Farr  Procedures Performed:  EEG OVERNIGHT W/VIDEO: Medications:  Keppra, Dilantin  Conditions of Recording: This is a continuous 16 channel EEG carried out with concomitant video monitoring from 04/10/2013 at 11:49AM to 04/11/2013 at 10:20AM. Awake, drowsy and asleep states were captured.  Description: The waking background activity consists of a low voltage, symmetrical, fairly well organized, 8 Hz alpha activity, seen from the parieto-occipital and posterior temporal regions. Low voltage fast  activity, poorly organized, is seen anteriorly and is at times superimposed on more posterior regions. A mixture of theta and alpha rhythms are seen from the central and temporal regions. Occasional isolated sharp transients were noted over the right hemisphere with phase reversal at T4.  The patient drowses with slowing to irregular, low voltage theta and beta activity.  The patient goes in to a light sleep with symmetrical sleep spindles, vertex central sharp transients and irregular slow activity.  IMPRESSION:  Occasional sharp transients were noted in the right temporal region. No electrographic seizure activity was noted during the entire tracing.  Thana Farr, MD  Report Date: 04/10/2013  Interpreting Physician: Thana Farr D  History: Cameron Potts is an 77 y.o. male presenting with breakthrough seizure activity  Medications:  Dilantin, Keppra  Conditions of Recording: This is a 16 channel EEG carried out with the patient in the asleep state.  Description: The background activity is asymmetric. On the right hemisphere there is continuous sharp activity eminating from the mid temporal region. This sharp activity is rhythmic occurring every 5 seconds with attenuated activity seen otherwise. On the left hemisphere the background activity is slow and poorly organized with superimposed sleep spindles.  Hyperventilation and intermittent photic stimulation were not performed.  IMPRESSION:  This is an abnormal EEG due to persistent sharp activity over the right temporal region consistent with electrographic focal status epilepticus.  Thana Farr, MD   Admission HPI: Cameron Potts is a 77 year old male with a PMH of complex seizures, glaucoma, HLD. He is accompanied by his wife who provides the majority of the history.  Cameron Potts has had complex seizures for many years, these were attributed to a right frontal lesion which was resected 8 years ago. He has been managed by Dr. Kizzie Ide at [facility] in [place], [place]. Dr. Kizzie Ide has titrated his seizure medications up to 1500mg  of Keppra BID and 200mg  of Dilantin BID. He has apparently not always been compliant with his medications but his wife reports he has been very compliant since his last seizure a year and a half ago. She also reports that an MRI done at that time showed no residual lesion.  This morning his wife reports that Cameron Potts stated that his left arm felt numb and that he believed a seizure was coming. She reported he then seemed to stair off to the left and his left side became contracted. He was brought the the ED where he was noted to have a seizure manifested by left arm and leg  rigidity. He was given 2mg  IV ativan and was noted to be in a post ictal state. Wife reports this seizure onset and manifestations are consistent with previous seizures and patient frequently has psychosis for a few days after a seizure. Neurology was consulted in the ED and requested to have patient admitted. Attending Addendum: I have seen and evaluated the patient as outlined above. 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 He has been on antiepileptics since with questionable adherence.  He comes to ED on 10-25 with L arm numbness (his usual prodrome). He then had L sided contraction and rigidity in ED. He was reloaded with keppra, dilantin as well as given ativan.  Ginnie Smart, MD   Hospital Course by  problem list:    Complex focal seizure in setting of  Seizure disorder with status epilepticus  Patient has a reported long history of complex seizures involving left side of body. These were attributed to a right frontal lesion (astrocytoma vs ganglioma) that was resected 8 years ago. He presented to the [facility] ED after a seizure that morning witnessed by his wife.  In the ED he was noted to have another seizure characterized by rigidity and tonic movements of his left arm.  He receive 2mg  IV ativan in the ED.  He was evaluated by Neurology and EEG showed continuous focal sharp activity, he was given another 1mg  of Ativan and loaded with Vimpat at 400mg .  A continuous EEG video was ordered.  His wife reported he was compliant with his home anti seizure medications of Keppra 1500 BID and Dilantin 400mg , his phenytoin level was 13.5 on admission.  Overnight the patient had no further clinical or electrographic seizure activity.  Of note there was a slight increase in PR interval  overnight likely due to the large loading dose of Vimpat used to break NCSE.  The Vimpat dose was reduced to 100mg  BID and EEG monitoring was discontinued.  Patient was discharged from the hospital with 2 months of prescription for Vimpat as he is to follow up with his primary Neurology Dr Kizzie Ide in [place].  He was unsure if he would be able to get a close appointment with Dr. Kizzie Ide due to the upcoming holidays.  The importance of close follow up was stressed but he was allowed to have 2 month prescription as the benefit of having this medication was felt to outweigh the risks.   Discharge Vitals:   BP 140/68  Pulse 54  Temp(Src) 98.3 F (36.8 C) (Oral)  Resp 14  Ht 5\' 10"  (1.778 m)  Wt 165 lb 12.6 oz (75.2 kg)  BMI 23.79 kg/m2  SpO2 98%  Discharge Labs:   Ref. Range 04/11/2013 05:05  Sodium Latest Range: 135-145 mEq/L 136  Potassium Latest Range: 3.5-5.1 mEq/L 3.8  Chloride Latest Range: 96-112 mEq/L 101  CO2 Latest Range: 19-32 mEq/L 24  BUN Latest Range: 6-23 mg/dL 8  Creatinine Latest Range: 0.50-1.35 mg/dL 1.61  Calcium Latest Range: 8.4-10.5 mg/dL 8.6  GFR calc non Af Amer Latest Range: >90 mL/min >90  GFR calc Af Amer Latest Range: >90 mL/min >90  Glucose Latest Range: 70-99 mg/dL 096 (H)  Alkaline Phosphatase Latest Range: 39-117 U/L 74  Albumin Latest Range: 3.5-5.2 g/dL 3.6  AST Latest Range: 0-37 U/L 19  ALT Latest Range: 0-53 U/L 14  Total Protein Latest Range: 6.0-8.3 g/dL 6.3  Total Bilirubin Latest Range: 0.3-1.2 mg/dL 0.3   Signed: Carlynn Purl, DO 04/19/2013, 10:18 PM   Time Spent on Discharge: 40 minutes Services Ordered on Discharge: None Equipment Ordered on Discharge: None

## 2013-04-11 NOTE — Progress Notes (Signed)
Subjective: Patient awake, alert, and oriented.  Reports he feels much better, excited to have EEG removed and be able to walk around.  Reports he would like to be discharged as soon as possible. Objective: Vital signs in last 24 hours: Filed Vitals:   04/10/13 2003 04/11/13 0104 04/11/13 0315 04/11/13 0800  BP: 134/60 152/60 165/55 140/68  Pulse: 54 53 55 54  Temp: 98 F (36.7 C) 97.4 F (36.3 C) 97.3 F (36.3 C)   TempSrc: Oral Oral Oral   Resp: 14 15 18 14   Height:      Weight:      SpO2: 98% 99% 99% 98%   Weight change:   Intake/Output Summary (Last 24 hours) at 04/11/13 1100 Last data filed at 04/11/13 0800  Gross per 24 hour  Intake    320 ml  Output   1350 ml  Net  -1030 ml   General: resting in bed HEENT: EOMI Cardiac: RRR, no murmurs  Pulm: clear to auscultation bilaterally, moving normal volumes of air Abd: soft, nontender, nondistended, BS present Ext: warm and well perfused, no pedal edema Neuro: alert and oriented X3, cranial nerves II-XII grossly intact, normal finger to nose, 5/5 muscle strength.  Lab Results: Basic Metabolic Panel:  Recent Labs Lab 04/10/13 0525 04/10/13 1450 04/11/13 0505  NA 138  --  136  K 4.7  --  3.8  CL 101  --  101  CO2 22  --  24  GLUCOSE 103*  --  100*  BUN 16  --  8  CREATININE 0.87 0.71 0.69  CALCIUM 9.2  --  8.6  MG 2.1  --   --    Liver Function Tests:  Recent Labs Lab 04/11/13 0505  AST 19  ALT 14  ALKPHOS 74  BILITOT 0.3  PROT 6.3  ALBUMIN 3.6   CBC:  Recent Labs Lab 04/10/13 0525 04/10/13 1450  WBC 9.1 7.9  NEUTROABS 3.0  --   HGB 14.8 13.8  HCT 40.9 37.5*  MCV 90.5 90.6  PLT 189 168   CBG:  Recent Labs Lab 04/10/13 0540  GLUCAP 96   Urine Drug Screen: Drugs of Abuse     Component Value Date/Time   LABOPIA NONE DETECTED 04/10/2013 0910   COCAINSCRNUR NONE DETECTED 04/10/2013 0910   LABBENZ POSITIVE* 04/10/2013 0910   AMPHETMU NONE DETECTED 04/10/2013 0910   THCU NONE  DETECTED 04/10/2013 0910   LABBARB NONE DETECTED 04/10/2013 0910    Urinalysis:  Recent Labs Lab 04/10/13 0910  COLORURINE YELLOW  LABSPEC 1.021  PHURINE 6.0  GLUCOSEU NEGATIVE  HGBUR NEGATIVE  BILIRUBINUR NEGATIVE  KETONESUR NEGATIVE  PROTEINUR NEGATIVE  UROBILINOGEN 0.2  NITRITE NEGATIVE  LEUKOCYTESUR NEGATIVE    Micro Results: No results found for this or any previous visit (from the past 240 hour(s)). Studies/Results: No results found. Medications: I have reviewed the patient's current medications. Scheduled Meds: . ALPRAZolam  0.5 mg Oral BID  . atorvastatin  40 mg Oral q1800  . brimonidine  1 drop Both Eyes BID   And  . timolol  1 drop Both Eyes BID  . enoxaparin (LOVENOX) injection  40 mg Subcutaneous Q24H  . finasteride  5 mg Oral QHS  . lacosamide (VIMPAT) IV  200 mg Intravenous Once  . lacosamide  100 mg Oral BID  . latanoprost  1 drop Both Eyes QHS  . levETIRAcetam  1,500 mg Oral BID  . phenytoin  200 mg Oral BID  . sodium chloride  3 mL Intravenous Q12H   Continuous Infusions:  PRN Meds:.acetaminophen, acetaminophen Assessment/Plan: # Complex Focal Seizure  Patient has a reported long history of complex seizures involving left side of body. 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  -Patient and wife report symptoms have resolved completely -Overnight Video EEG monitoring shows no further seizure activity, was discontinued this AM. -Continue Keppra 1500mg  BID, change to PO  -Continue Dilantin 200mg  BID, change to PO.  - Vimpat 100mg  BID - Per neurology will discharge patient home on home medications with Vimpat, he is to follow up with Dr. Kizzie Ide in [place], will attempt to send records ahead. # Glaucoma  -Continue home eye drops  # Hyperlipidemia  -Atorvastatin 40mg , hospital formulary alternative to crestor 20mg .  DVT PPx: Lovenox  Dispo: Discharge home today. The patient does  have a current PCP (Dr. Antionette Poles) and does not need an [facility] hospital follow-up appointment after discharge.  The patient does not have transportation limitations that hinder transportation to clinic appointments.   .Services Needed at time of discharge: Y = Yes, Blank = No PT:   OT:   RN:   Equipment:   Other:     LOS: 1 day   Carlynn Purl, DO 04/11/2013, 11:00 AM

## 2013-04-11 NOTE — Progress Notes (Signed)
Subjective: Patient awake and alert this morning.  Speech better.  Somewhat irritable which per his wife is normal after seizure activity.  Last evening P-R interval on EKG noted to be prolonged at .28.  IV Vimpat held.  EKG this morning shows PR interval of .23.  Was .22 on admission.  Patient is asymptomatic.    Objective: Current vital signs: BP 165/55  Pulse 55  Temp(Src) 97.3 F (36.3 C) (Oral)  Resp 18  Ht 5\' 10"  (1.778 m)  Wt 75.2 kg (165 lb 12.6 oz)  BMI 23.79 kg/m2  SpO2 99% Vital signs in last 24 hours: Temp:  [97.3 F (36.3 C)-98 F (36.7 C)] 97.3 F (36.3 C) (10/26 0315) Pulse Rate:  [52-63] 55 (10/26 0315) Resp:  [14-20] 18 (10/26 0315) BP: (116-165)/(49-86) 165/55 mmHg (10/26 0315) SpO2:  [98 %-100 %] 99 % (10/26 0315) Weight:  [75.2 kg (165 lb 12.6 oz)] 75.2 kg (165 lb 12.6 oz) (10/25 1131)  Intake/Output from previous day: 10/25 0701 - 10/26 0700 In: 320 [P.O.:320] Out: 1200 [Urine:1200] Intake/Output this shift:   Nutritional status: General  Neurologic Exam: Mental Status:  Awake and alert. Speech fluent without evidence of aphasia.  Cranial Nerves:  II: Discs flat bilaterally; Visual fields grossly normal, pupils equal, round, reactive to light and accommodation  III,IV, VI: ptosis not present, extra-ocular motions intact bilaterally  V,VII: smile symmetric, facial light touch sensation normal bilaterally  VIII: hearing normal bilaterally  IX,X: gag reflex present  XI: bilateral shoulder shrug  XII: midline tongue extension without atrophy or fasciculations  Motor:  5/5 bilaterally.  No clonic activity noted.   Sensory: Pinprick and light touch intact throughout, bilaterally  Deep Tendon Reflexes:  2+ throughout Plantars:  Right: downgoing    Left: downgoing   Lab Results: Basic Metabolic Panel:  Recent Labs Lab 04/10/13 0525 04/10/13 1450 04/11/13 0505  NA 138  --  136  K 4.7  --  3.8  CL 101  --  101  CO2 22  --  24  GLUCOSE 103*   --  100*  BUN 16  --  8  CREATININE 0.87 0.71 0.69  CALCIUM 9.2  --  8.6  MG 2.1  --   --     Liver Function Tests:  Recent Labs Lab 04/11/13 0505  AST 19  ALT 14  ALKPHOS 74  BILITOT 0.3  PROT 6.3  ALBUMIN 3.6   No results found for this basename: LIPASE, AMYLASE,  in the last 168 hours No results found for this basename: AMMONIA,  in the last 168 hours  CBC:  Recent Labs Lab 04/10/13 0525 04/10/13 1450  WBC 9.1 7.9  NEUTROABS 3.0  --   HGB 14.8 13.8  HCT 40.9 37.5*  MCV 90.5 90.6  PLT 189 168    Cardiac Enzymes: No results found for this basename: CKTOTAL, CKMB, CKMBINDEX, TROPONINI,  in the last 168 hours  Lipid Panel: No results found for this basename: CHOL, TRIG, HDL, CHOLHDL, VLDL, LDLCALC,  in the last 168 hours  CBG:  Recent Labs Lab 04/10/13 0540  GLUCAP 96    Microbiology: No results found for this or any previous visit.  Coagulation Studies: No results found for this basename: LABPROT, INR,  in the last 72 hours  Imaging: No results found.  Medications:  I have reviewed the patient's current medications. Scheduled: . ALPRAZolam  0.5 mg Oral BID  . atorvastatin  40 mg Oral q1800  . brimonidine  1 drop  Both Eyes BID   And  . timolol  1 drop Both Eyes BID  . enoxaparin (LOVENOX) injection  40 mg Subcutaneous Q24H  . finasteride  5 mg Oral QHS  . lacosamide (VIMPAT) IV  200 mg Intravenous Once  . lacosamide  100 mg Oral BID  . latanoprost  1 drop Both Eyes QHS  . levETIRAcetam  1,500 mg Oral BID  . phenytoin  200 mg Oral BID  . sodium chloride  3 mL Intravenous Q12H    Assessment/Plan: Patient without any further clinical seizure activity noted.  EEG reviewed and shows no further electrographic seizure activity.  IV anticonvulsants held last evening due to prolonged PR interval.  PR interval slightly increased at presentation.  There is noted dose dependent increase in PR interval with the use of Vimpat.  IV loading dose was quite  large in an attempt to break NCSE but now that controlled will decrease the dose significantly and give po.  IV Dilantin was being administered at this time as well.   Patient asymptomatic at this time.    Recommendations: 1.  Patient to start po dosing of anticonvulsants today as prescribed. 2.  Continuous EEG monitoring to be discontinued today.   3.  Continue seizure precautions   LOS: 1 day   Thana Farr, MD [facility] [phone] 04/11/2013  8:12 AM

## 2013-04-11 NOTE — H&P (Addendum)
  Date: 04/11/2013  Patient name: Cameron Potts  Medical record number: [id number]  Date of birth: 11-28-1935   I have seen and evaluated [name] and discussed their care with the Residency Team.   Assessment and Plan: I have seen and evaluated the patient as outlined above. 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 He has been on antiepileptics since with questionable adherence.  He comes to ED on 10-25 with L arm numbness (his usual prodrome). He then had L sided contraction and rigidity in ED. He was reloaded with keppra, dilantin as well as given ativan.   Filed Vitals:   04/11/13 0800  BP: 140/68  Pulse: 54  Temp:   Resp: 14  Eyes: EMOI, PERRL Mouth: without lesion, no trauma to tongue Neck: NT, no LAN Chest: CTA CV: RRR ABD: BS+, soft, nontender Extr: no edema Neuro: strength is 5/5 UE and LE. His coordination is NL. He has no tremor.   Labs of note: WBC 7.9 Dilantin 13.5   A/P Complex focal seizures Previous Brain Tumor (astrocytoma vs ganglioma)  appreciate neuro f/u Monitor in step down, continuous EEG for now continue benzodiazpeine, keppra, dilantin.  Addition of Vimpat per neuro Bedside swallow eval.  No repeat CNS imaging (had MRI 01-2013 which did not show recurrence). His previous surgery was done at [facility] clinic in [place].   Ginnie Smart, MD 10/26/20149:40 AM

## 2013-04-12 NOTE — Procedures (Signed)
ELECTROENCEPHALOGRAM REPORT   Patient: Cameron Potts       Room #: 1O10 EEG No. ID: [id number] Age: 77 y.o.        Sex: male Referring Physician: Ninetta Lights Report Date:  04/12/2013        Interpreting Physician: Thana Farr D  History: [name] is an 77 y.o. male with history of seizures presenting in focal status epilepticus.    Medications:  Keppra, Dilantin  Conditions of Recording:  This is a continuous 16 channel EEG carried out with concomitant video monitoring from 04/10/2013 at 11:49AM to 04/11/2013 at 10:20AM.  Awake, drowsy and asleep states were captured.    Description:  The waking background activity consists of a low voltage, symmetrical, fairly well organized, 8 Hz alpha activity, seen from the parieto-occipital and posterior temporal regions.  Low voltage fast activity, poorly organized, is seen anteriorly and is at times superimposed on more posterior regions.  A mixture of theta and alpha rhythms are seen from the central and temporal regions.  Occasional isolated sharp transients were noted over the right hemisphere with phase reversal at T4.   The patient drowses with slowing to irregular, low voltage theta and beta activity.   The patient goes in to a light sleep with symmetrical sleep spindles, vertex central sharp transients and irregular slow activity.    IMPRESSION: Occasional sharp transients were noted in the right temporal region.  No electrographic seizure activity was noted during the entire tracing.     Thana Farr, MD [facility] [phone] 04/12/2013, 3:33 PM

## 2013-04-12 NOTE — Procedures (Signed)
ELECTROENCEPHALOGRAM REPORT   Patient: Cameron Potts       Room #: 1O10 EEG No. ID: [id number] Age: 77 y.o.        Sex: male Referring Physician: Ninetta Lights Report Date:  04/10/2013        Interpreting Physician: Thana Farr D  History: Cameron Potts is an 77 y.o. male presenting with breakthrough seizure activity  Medications:  Dilantin, Keppra  Conditions of Recording:  This is a 16 channel EEG carried out with the patient in the asleep state.  Description:  The background activity is asymmetric.  On the right hemisphere there is continuous sharp activity eminating from the mid temporal region.  This sharp activity is rhythmic occurring every 5 seconds with attenuated activity seen otherwise.  On the left hemisphere the background activity is slow and poorly organized with superimposed sleep spindles.   Hyperventilation and intermittent photic stimulation were not performed.  IMPRESSION: This is an abnormal EEG due to persistent sharp activity over the right temporal region consistent with electrographic focal status epilepticus.     Thana Farr, MD [facility] [phone] 04/12/2013, 3:26 PM

## 2013-04-22 ENCOUNTER — Other Ambulatory Visit: Payer: Self-pay

## 2013-09-28 ENCOUNTER — Emergency Department (HOSPITAL_COMMUNITY)
Admission: EM | Admit: 2013-09-28 | Discharge: 2013-09-28 | Disposition: A | Discharge status: Home / Self Care | Payer: Medicare Other | Attending: Emergency Medicine | Admitting: Emergency Medicine

## 2013-09-28 ENCOUNTER — Encounter (HOSPITAL_COMMUNITY): Payer: Self-pay | Admitting: Emergency Medicine

## 2013-09-28 DIAGNOSIS — Z87891 Personal history of nicotine dependence: Secondary | ICD-10-CM | POA: Insufficient documentation

## 2013-09-28 DIAGNOSIS — R569 Unspecified convulsions: Secondary | ICD-10-CM

## 2013-09-28 DIAGNOSIS — G40909 Epilepsy, unspecified, not intractable, without status epilepticus: Secondary | ICD-10-CM | POA: Insufficient documentation

## 2013-09-28 DIAGNOSIS — Z79899 Other long term (current) drug therapy: Secondary | ICD-10-CM | POA: Insufficient documentation

## 2013-09-28 DIAGNOSIS — E7800 Pure hypercholesterolemia, unspecified: Secondary | ICD-10-CM | POA: Insufficient documentation

## 2013-09-28 DIAGNOSIS — H5440 Blindness, one eye, unspecified eye: Secondary | ICD-10-CM | POA: Insufficient documentation

## 2013-09-28 LAB — CBC WITH DIFFERENTIAL/PLATELET
BASOS PCT: 1 % (ref 0–1)
Basophils Absolute: 0.1 10*3/uL (ref 0.0–0.1)
EOS PCT: 9 % — AB (ref 0–5)
Eosinophils Absolute: 0.7 10*3/uL (ref 0.0–0.7)
HEMATOCRIT: 37.6 % — AB (ref 39.0–52.0)
HEMOGLOBIN: 13.4 g/dL (ref 13.0–17.0)
LYMPHS PCT: 26 % (ref 12–46)
Lymphs Abs: 2.1 10*3/uL (ref 0.7–4.0)
MCH: 32.2 pg (ref 26.0–34.0)
MCHC: 35.6 g/dL (ref 30.0–36.0)
MCV: 90.4 fL (ref 78.0–100.0)
MONO ABS: 1.1 10*3/uL — AB (ref 0.1–1.0)
MONOS PCT: 14 % — AB (ref 3–12)
NEUTROS ABS: 4 10*3/uL (ref 1.7–7.7)
Neutrophils Relative %: 50 % (ref 43–77)
Platelets: 198 10*3/uL (ref 150–400)
RBC: 4.16 MIL/uL — ABNORMAL LOW (ref 4.22–5.81)
RDW: 13 % (ref 11.5–15.5)
WBC: 8 10*3/uL (ref 4.0–10.5)

## 2013-09-28 LAB — I-STAT CHEM 8, ED
BUN: 15 mg/dL (ref 6–23)
CHLORIDE: 98 meq/L (ref 96–112)
Calcium, Ion: 1.16 mmol/L (ref 1.13–1.30)
Creatinine, Ser: 1.1 mg/dL (ref 0.50–1.35)
Glucose, Bld: 109 mg/dL — ABNORMAL HIGH (ref 70–99)
HCT: 40 % (ref 39.0–52.0)
Hemoglobin: 13.6 g/dL (ref 13.0–17.0)
Potassium: 5.1 mEq/L (ref 3.7–5.3)
Sodium: 137 mEq/L (ref 137–147)
TCO2: 29 mmol/L (ref 0–100)

## 2013-09-28 LAB — PHENYTOIN LEVEL, TOTAL: PHENYTOIN LVL: 13.9 ug/mL (ref 10.0–20.0)

## 2013-09-28 LAB — CBG MONITORING, ED: Glucose-Capillary: 82 mg/dL (ref 70–99)

## 2013-09-28 LAB — PROTIME-INR
INR: 2.76 — ABNORMAL HIGH (ref 0.00–1.49)
Prothrombin Time: 28.2 seconds — ABNORMAL HIGH (ref 11.6–15.2)

## 2013-09-28 NOTE — ED Notes (Signed)
Patient states seizure activity today, patient states frontal lobe seizures affecting him right to left , patient states at this time he feels no aura or seizure activity, patient states some tingling in left fingers

## 2013-09-28 NOTE — Discharge Instructions (Signed)
Seizure, Adult A seizure is abnormal electrical activity in the brain. Seizures usually last from 30 seconds to 2 minutes. There are various types of seizures. Before a seizure, you may have a warning sensation (aura) that a seizure is about to occur. An aura may include the following symptoms:   Fear or anxiety.  Nausea.  Feeling like the room is spinning (vertigo).  Vision changes, such as seeing flashing lights or spots. Common symptoms during a seizure include:  A change in attention or behavior (altered mental status).  Convulsions with rhythmic jerking movements.  Drooling.  Rapid eye movements.  Grunting.  Loss of bladder and bowel control.  Bitter taste in the mouth.  Tongue biting. After a seizure, you may feel confused and sleepy. You may also have an injury resulting from convulsions during the seizure. HOME CARE INSTRUCTIONS   If you are given medicines, take them exactly as prescribed by your health care provider.  Keep all follow-up appointments as directed by your health care provider.  Do not swim or drive or engage in risky activity during which a seizure could cause further injury to you or others until your health care provider says it is OK.  Get adequate rest.  Teach friends and family what to do if you have a seizure. They should:  Lay you on the ground to prevent a fall.  Put a cushion under your head.  Loosen any tight clothing around your neck.  Turn you on your side. If vomiting occurs, this helps keep your airway clear.  Stay with you until you recover.  Know whether or not you need emergency care. SEEK IMMEDIATE MEDICAL CARE IF:  The seizure lasts longer than 5 minutes.  The seizure is severe or you do not wake up immediately after the seizure.  You have an altered mental status after the seizure.  You are having more frequent or worsening seizures. Someone should drive you to the emergency department or call local emergency  services (911 in [place] MAKE SURE YOU:  Understand these instructions.  Will watch your condition.  Will get help right away if you are not doing well or get worse. Document Released: 05/31/2000 Document Revised: 03/24/2013 Document Reviewed: 01/13/2013 [employer] Patient Information 2014 [employer].  

## 2013-09-28 NOTE — ED Notes (Signed)
Pt is up for discharge sts that he was told he would have a ct before being discharged.  Dr. Jodi Mourning aware and sts he will speak with pt.

## 2013-09-28 NOTE — ED Provider Notes (Signed)
CSN: [id number]     Arrival date & time 09/28/13  1701 History   First MD Initiated Contact with Patient 09/28/13 1712     Chief Complaint  Patient presents with  . Seizures    today approx 45 min pta,      (Consider location/radiation/quality/duration/timing/severity/associated sxs/prior Treatment) HPI  This is a 78 y.o. male with history of right-sided intracranial mass, long history of seizures, presenting today with suspected seizure.  Onset PTA, at home.  Pt experienced aura, which is described as "warmness" in his LUE.  After that, head turned to left, LUE was paralyzed, per normal seizure activity for patient.  Wife unsure of how long this lasted, but estimates ten minutes.  Pt took PM meds just before seizure.  When paramedics arrived, seizure resolved.  Pt has no complaints at this time.  "I feel completely normal."     Past Medical History  Diagnosis Date  . Glaucoma   . Hypercholesteremia   . Seizures   . Right frontal lobe lesion   . Blind left eye    History reviewed. No pertinent past surgical history. No family history on file. History  Substance Use Topics  . Smoking status: Former Research scientist (life sciences)  . Smokeless tobacco: Not on file  . Alcohol Use: No    Review of Systems  Constitutional: Negative for fever and chills.  HENT: Negative for facial swelling.   Eyes: Negative for pain and visual disturbance.  Respiratory: Negative for chest tightness and shortness of breath.   Cardiovascular: Negative for chest pain.  Gastrointestinal: Negative for nausea and vomiting.  Genitourinary: Negative for dysuria.  Musculoskeletal: Negative for arthralgias and myalgias.  Neurological: Positive for seizures (resolved). Negative for headaches.  Psychiatric/Behavioral: Negative for behavioral problems.      Allergies  Review of patient's allergies indicates no known allergies.  Home Medications   Prior to Admission medications   Medication Sig Start Date End Date Taking?  Authorizing Provider  ALPRAZolam Duanne Moron) 0.5 MG tablet Take 0.5 mg by mouth every evening.    Historical Provider, MD  bimatoprost (LUMIGAN) 0.01 % SOLN Place 1 drop into both eyes at bedtime.    Historical Provider, MD  brimonidine-timolol (COMBIGAN) 0.2-0.5 % ophthalmic solution Place 1 drop into both eyes every 12 (twelve) hours.    Historical Provider, MD  finasteride (PROSCAR) 5 MG tablet Take 5 mg by mouth at bedtime.    Historical Provider, MD  Lacosamide 100 MG TABS Take 1 tablet (100 mg total) by mouth 2 (two) times daily. 04/11/13   Joni Reining, DO  levETIRAcetam (KEPPRA) 500 MG tablet Take 1,500 mg by mouth every 12 (twelve) hours.    Historical Provider, MD  phenytoin (DILANTIN) 100 MG ER capsule Take 200 mg by mouth 2 (two) times daily.    Historical Provider, MD  rosuvastatin (CRESTOR) 20 MG tablet Take 20 mg by mouth daily.    Historical Provider, MD   BP 166/63  Pulse 61  Temp(Src) 98.3 F (36.8 C) (Oral)  Resp 18  SpO2 100% Physical Exam  Constitutional: He is oriented to person, place, and time. He appears well-developed and well-nourished. No distress.  HENT:  Head: Normocephalic and atraumatic.  Mouth/Throat: No oropharyngeal exudate.  Eyes: Conjunctivae are normal. Pupils are equal, round, and reactive to light. No scleral icterus.  Neck: Normal range of motion. No tracheal deviation present. No thyromegaly present.  Cardiovascular: Normal rate, regular rhythm and normal heart sounds.  Exam reveals no gallop and no friction rub.  No murmur heard. Pulmonary/Chest: Effort normal and breath sounds normal. No stridor. No respiratory distress. He has no wheezes. He has no rales. He exhibits no tenderness.  Abdominal: Soft. He exhibits no distension and no mass. There is no tenderness. There is no rebound and no guarding.  Musculoskeletal: Normal range of motion. He exhibits no edema.  Neurological: He is alert and oriented to person, place, and time. He has normal  strength. He displays no atrophy and no tremor. No cranial nerve deficit or sensory deficit. He exhibits normal muscle tone. He displays a negative Romberg sign. He displays no seizure activity. Coordination and gait normal. GCS eye subscore is 4. GCS verbal subscore is 5. GCS motor subscore is 6.  Reflex Scores:      Patellar reflexes are 2+ on the right side and 2+ on the left side. Skin: Skin is warm and dry. He is not diaphoretic.    ED Course  Procedures (including critical care time) Labs Review Labs Reviewed  CBC WITH DIFFERENTIAL - Abnormal; Notable for the following:    RBC 4.16 (*)    HCT 37.6 (*)    Monocytes Relative 14 (*)    Monocytes Absolute 1.1 (*)    Eosinophils Relative 9 (*)    All other components within normal limits  PROTIME-INR - Abnormal; Notable for the following:    Prothrombin Time 28.2 (*)    INR 2.76 (*)    All other components within normal limits  I-STAT CHEM 8, ED - Abnormal; Notable for the following:    Glucose, Bld 109 (*)    All other components within normal limits  PHENYTOIN LEVEL, TOTAL  CBG MONITORING, ED    Imaging Review No results found.   EKG Interpretation None      MDM   Final diagnoses:  None    This is a 78 y.o. male with history of right-sided intracranial mass, long history of seizures, presenting today with suspected seizure.  Onset PTA, at home.  Pt experienced aura, which is described as "warmness" in his LUE.  After that, head turned to left, LUE was paralyzed, per normal seizure activity for patient.  Wife unsure of how long this lasted, but estimates ten minutes.  Pt took PM meds just before seizure.  When paramedics arrived, seizure resolved.  Pt has no complaints at this time.  "I feel completely normal."     Neurologic exam is WNL, as above.    Pt has therapeutic phenytoin levels.  I do not believe seizure is being caused by ICH, mass, glucose abnormality, electrolyte abnormality, infection, acidosis,  intoxication, or withdrawal.  As pt had seizure which was far less severe than past seizures, has known seizure DO, and is at baseline at this time, I do not believe that further emergent or inpatient evaluation or treatment is indicated at this time.  Pt is being discharge in stable condition.  I have answered all questions and given good return precautions.    I have discussed case and care has been guided by my attending physician, Dr. Maryan Rued.  Doy Hutching, MD 09/29/13 [phone]

## 2013-09-28 NOTE — ED Notes (Signed)
Pt dc to home with wife. Pt sts understanding. Wife was contacted by phone to discuss discharge. She sts she will f/u with neurology for tx.  nadn.

## 2013-09-29 ENCOUNTER — Telehealth (HOSPITAL_BASED_OUTPATIENT_CLINIC_OR_DEPARTMENT_OTHER): Payer: Self-pay

## 2013-09-29 NOTE — ED Provider Notes (Signed)
I saw and evaluated the patient, reviewed the resident's note and I agree with the findings and plan.   EKG Interpretation None      Pt with sz disorder who had a seizure today but resolved.  No signs of status and antiepileptic levels are wnl.  Pt has neurologist to f/u if sz become more frequent.  Pt has had no head trauma and at baseline per pt and family.  Blanchie Dessert, MD 09/29/13 1356

## 2013-09-29 NOTE — Telephone Encounter (Signed)
Pt calling for INR level.  INR level given.

## 2013-12-20 ENCOUNTER — Encounter: Payer: Self-pay | Admitting: Neurology

## 2013-12-21 ENCOUNTER — Ambulatory Visit (INDEPENDENT_AMBULATORY_CARE_PROVIDER_SITE_OTHER): Payer: Medicare Other | Admitting: Neurology

## 2013-12-21 ENCOUNTER — Encounter: Payer: Self-pay | Admitting: Neurology

## 2013-12-21 ENCOUNTER — Telehealth: Payer: Self-pay | Admitting: *Deleted

## 2013-12-21 VITALS — BP 138/82 | HR 77 | Ht 70.5 in | Wt 166.0 lb

## 2013-12-21 DIAGNOSIS — R269 Unspecified abnormalities of gait and mobility: Secondary | ICD-10-CM

## 2013-12-21 DIAGNOSIS — G40219 Localization-related (focal) (partial) symptomatic epilepsy and epileptic syndromes with complex partial seizures, intractable, without status epilepticus: Secondary | ICD-10-CM

## 2013-12-21 DIAGNOSIS — Z5181 Encounter for therapeutic drug level monitoring: Secondary | ICD-10-CM

## 2013-12-21 HISTORY — DX: Unspecified abnormalities of gait and mobility: R26.9

## 2013-12-21 NOTE — Progress Notes (Signed)
Reason for visit: Seizures  Cameron Potts is a 78 y.o. male  History of present illness:  Cameron Potts is a 78 year old right-handed white male with a history of intractable epilepsy. The currently splits his time between [place] and the [place] area, and he is followed through the [facility] in [place]. The patient has had seizures over the last 22 years. The episodes initially began with a lightheaded sensation and difficulty with cognitive processing that would represent an aura. The patient was found to have right brain seizures, and he was placed on Dilantin. He has been on Dilantin since that time. The patient also had episodes of abdominal discomfort, nausea, and sweating associated with cognitive changes also felt to be seizures. The patient could not be controlled with the seizures, and he eventually underwent epilepsy surgery with an anterior temporal lobectomy in 2008 done in [place]. The patient did well for about 8 or 9 months following the surgery without seizures. He decided to taper himself off of his Dilantin, but the seizures recurred. The patient has been on Keppra and Dilantin without full control over a number of years. The patient was admitted to [facility] in October 2014 with a prolonged seizure event. The patient was placed on Vimpat at that time. He followed up in [place] following this, and an attempt was made to taper him off of the Dilantin, but he went into status epilepticus requiring a six-day intensive care unit stay requiring intubation. The patient has had post ictal psychosis in the past, and he has prolonged cognitive deficits following seizures that may last several weeks. The patient last had a seizure about 10 days ago. His Dilantin was increased by 50 mg daily. This was started one week ago. The patient does have chronic gait instability that has worsened gradually over time. He will have numbness and weakness of the left arm, increased  gait instability and difficulty with increased confusion. He has had a generalized tonic-clonic seizure 10 years ago prior to his epilepsy surgery. He is on brand-name Dilantin, and generic Keppra. He is back on Vimpat at this point. His last Dilantin level in our system was around 13 in April 2015. The patient does not operate a motor vehicle. He indicates that he does take calcium and vitamin D supplementation twice daily. An EEG study done in October 2015 at [facility] shows a right brain focus.  Past Medical History  Diagnosis Date  . Glaucoma   . Hypercholesteremia   . Seizures   . Right frontal lobe lesion   . Blind left eye   . Cardiac arrest   . Mild left ventricular systolic dysfunction   . Hyperlipidemia   . Urinary urgency   . PSA elevation   . Atrial fibrillation   . Abnormality of gait 12/21/2013    Past Surgical History  Procedure Laterality Date  . Tonsillectomy      age 66  . Vasectomy  1985  . Colonoscopy    . Nasal sinus surgery    . Craniotomy Right     right anterior temporal resection  . Cardiac defibrillator placement      Family History  Problem Relation Age of Onset  . Cancer - Lung Mother   . Cancer - Prostate Father     Social history:  reports that he has quit smoking. His smoking use included Cigarettes. He has a 7.5 pack-year smoking history. He has never used smokeless tobacco. He reports  that he does not drink alcohol or use illicit drugs.  Medications:  Current Outpatient Prescriptions on File Prior to Visit  Medication Sig Dispense Refill  . ALPRAZolam (XANAX) 0.5 MG tablet Take 0.5 mg by mouth 2 (two) times daily.       . bimatoprost (LUMIGAN) 0.01 % SOLN Place 1 drop into both eyes at bedtime.      . brimonidine-timolol (COMBIGAN) 0.2-0.5 % ophthalmic solution Place 1 drop into both eyes every 12 (twelve) hours.      . finasteride (PROSCAR) 5 MG tablet Take 5 mg by mouth at bedtime.      . Lacosamide 100 MG TABS Take 1 tablet (100  mg total) by mouth 2 (two) times daily.  60 tablet  0  . levETIRAcetam (KEPPRA) 500 MG tablet Take 1,500 mg by mouth every 12 (twelve) hours.      . phenytoin (DILANTIN) 100 MG ER capsule Take 200 mg by mouth 2 (two) times daily.      . rosuvastatin (CRESTOR) 20 MG tablet Take 20 mg by mouth daily.       No current facility-administered medications on file prior to visit.      Allergies  Allergen Reactions  . Depakote [Divalproex Sodium] Other (See Comments)    Arthralgias     ROS:  Out of a complete 14 system review of symptoms, the patient complains only of the following symptoms, and all other reviewed systems are negative.  Seizures Constipation Gait disturbance  Blood pressure 138/82, pulse 77, height 5' 10.5" (1.791 m), weight 166 lb (75.297 kg).  Physical Exam  General: The patient is alert and cooperative at the time of the examination.  Eyes: Pupils are equal, round, and reactive to light. Discs are flat bilaterally.  Neck: The neck is supple, no carotid bruits are noted.  Respiratory: The respiratory examination is clear.  Cardiovascular: The cardiovascular examination reveals a regular rate and rhythm, no obvious murmurs or rubs are noted.  Skin: Extremities are without significant edema.  Neurologic Exam  Mental status: The patient is alert and oriented x 3 at the time of the examination. The patient has apparent normal recent and remote memory, with an apparently normal attention span and concentration ability. The Mini-Mental status examination done today shows a total score of 29/30.  Cranial nerves: Facial symmetry is present. There is good sensation of the face to pinprick and soft touch bilaterally. The strength of the facial muscles and the muscles to head turning and shoulder shrug are normal bilaterally. Speech is well enunciated, no aphasia or dysarthria is noted. Extraocular movements are full in the horizontal plane, the patient has apraxia with  superior gaze. Visual fields are full. The tongue is midline, and the patient has symmetric elevation of the soft palate. No obvious hearing deficits are noted.  Motor: The motor testing reveals 5 over 5 strength of all 4 extremities. Good symmetric motor tone is noted throughout.  Sensory: Sensory testing is intact to pinprick, soft touch, vibration sensation, and position sense on all 4 extremities. No evidence of extinction is noted.  Coordination: Cerebellar testing reveals good finger-nose-finger and heel-to-shin bilaterally.  Gait and station: Gait is slightly wide-based, the patient is able to walk independently. Tandem gait is unsteady. Romberg is negative. No drift is seen.  Reflexes: Deep tendon reflexes are symmetric, but are depressed bilaterally. Toes are downgoing bilaterally.   EEG report 04/12/2014:  IMPRESSION:  This is an abnormal EEG due to persistent sharp activity over the  right temporal region consistent with electrographic focal status epilepticus.     Assessment/Plan:  1. Intractable seizures, right brain focus  2. Gait disorder  The patient is on 3 different antiepileptic medications without full control. He has done poorly coming off of Dilantin, but he likely has a chronic gait disorder secondary to this medication. He likely should be on brand-name Keppra, and his Dilantin dosing was recently increased. The patient will return in one week to have a Dilantin level checked. He will followup through this office in about 4 months.  Jill Alexanders MD 12/21/2013 7:48 PM  [facility] [address] [place], [place] [address]  Phone [phone] Fax [phone]

## 2013-12-21 NOTE — Patient Instructions (Signed)

## 2013-12-22 NOTE — Telephone Encounter (Signed)
I called the wife. The Dilantin level from June 3 was 17, and 50 mg of Dilantin was added to his dose just recently. Within the last day or so, he has become more staggery. We will get a Dilantin level done tomorrow.

## 2013-12-23 ENCOUNTER — Encounter: Payer: Self-pay | Admitting: Neurology

## 2013-12-23 ENCOUNTER — Other Ambulatory Visit (INDEPENDENT_AMBULATORY_CARE_PROVIDER_SITE_OTHER): Payer: Self-pay

## 2013-12-23 DIAGNOSIS — G40219 Localization-related (focal) (partial) symptomatic epilepsy and epileptic syndromes with complex partial seizures, intractable, without status epilepticus: Secondary | ICD-10-CM

## 2013-12-23 DIAGNOSIS — R269 Unspecified abnormalities of gait and mobility: Secondary | ICD-10-CM

## 2013-12-23 DIAGNOSIS — Z0289 Encounter for other administrative examinations: Secondary | ICD-10-CM

## 2013-12-23 DIAGNOSIS — Z5181 Encounter for therapeutic drug level monitoring: Secondary | ICD-10-CM

## 2013-12-24 ENCOUNTER — Encounter: Payer: Self-pay | Admitting: Neurology

## 2013-12-24 ENCOUNTER — Telehealth: Payer: Self-pay | Admitting: Neurology

## 2013-12-24 LAB — PHENYTOIN LEVEL, TOTAL: PHENYTOIN LVL: 29.5 ug/mL — AB (ref 10.0–20.0)

## 2013-12-24 NOTE — Telephone Encounter (Signed)
I called the patient. The Dilantin level is in a toxic range, around 29. They are to stop the 50 mg tablet of the Dilantin, he was running good levels just taking the capsules of Dilantin.

## 2013-12-29 ENCOUNTER — Telehealth: Payer: Self-pay | Admitting: Neurology

## 2013-12-29 NOTE — Telephone Encounter (Signed)
Please advise previous note. Thanks  °

## 2013-12-29 NOTE — Telephone Encounter (Signed)
Spouse stated patients Dilantin levels are still too high.  After discontinuing Dilantin 50 mg and taking only Dilantin 100 mg 2 tabs in am and pm, patient is still very unstable.  Yesterday he experienced an episode for at least 25 minutes, wasn't sure what happened. Spouse questioning if Dilantin levels need to be rechecked.  Please call and advise. [phone].

## 2013-12-29 NOTE — Telephone Encounter (Signed)
I called patient. The patient is on a reduced dose of Dilantin, on his current dose, we noted the blood levels are around 17 with Dilantin. The patient is clearly feeling better, I would ask that he be patient, he should continue to improve over the next week. I'll not alter the dose of Dilantin.

## 2014-02-02 DIAGNOSIS — H53453 Other localized visual field defect, bilateral: Secondary | ICD-10-CM | POA: Insufficient documentation

## 2014-02-08 DIAGNOSIS — D075 Carcinoma in situ of prostate: Secondary | ICD-10-CM | POA: Insufficient documentation

## 2014-03-10 ENCOUNTER — Encounter: Payer: Self-pay | Admitting: *Deleted

## 2014-03-25 DIAGNOSIS — F4322 Adjustment disorder with anxiety: Secondary | ICD-10-CM | POA: Insufficient documentation

## 2014-03-25 DIAGNOSIS — H47233 Glaucomatous optic atrophy, bilateral: Secondary | ICD-10-CM | POA: Insufficient documentation

## 2014-03-25 DIAGNOSIS — H2513 Age-related nuclear cataract, bilateral: Secondary | ICD-10-CM | POA: Insufficient documentation

## 2014-03-25 DIAGNOSIS — E785 Hyperlipidemia, unspecified: Secondary | ICD-10-CM | POA: Insufficient documentation

## 2014-04-25 ENCOUNTER — Ambulatory Visit: Payer: PRIVATE HEALTH INSURANCE | Admitting: Neurology

## 2014-05-03 ENCOUNTER — Encounter: Payer: Self-pay | Admitting: Neurology

## 2014-05-03 ENCOUNTER — Ambulatory Visit (INDEPENDENT_AMBULATORY_CARE_PROVIDER_SITE_OTHER): Payer: Medicare Other | Admitting: Neurology

## 2014-05-03 VITALS — BP 146/83 | HR 81 | Ht 70.0 in | Wt 167.6 lb

## 2014-05-03 DIAGNOSIS — G40309 Generalized idiopathic epilepsy and epileptic syndromes, not intractable, without status epilepticus: Secondary | ICD-10-CM

## 2014-05-03 DIAGNOSIS — R269 Unspecified abnormalities of gait and mobility: Secondary | ICD-10-CM

## 2014-05-03 DIAGNOSIS — G40901 Epilepsy, unspecified, not intractable, with status epilepticus: Secondary | ICD-10-CM

## 2014-05-03 MED ORDER — LACOSAMIDE 50 MG PO TABS: 50.0000 mg | ORAL_TABLET | Freq: Every day | ORAL | Status: DC

## 2014-05-03 NOTE — Patient Instructions (Signed)

## 2014-05-03 NOTE — Progress Notes (Signed)
Reason for visit: seizures  Cameron Potts is an 78 y.o. male  History of present illness:  Cameron Potts is a 78 year old right-handed white male with a history of intractable seizures. He has had prior epilepsy surgery with a right anterior temporal lobe resection, but his seizures recurred following the surgery. He currently is on 3 different seizure medications that include Vimpat, Dilantin, and Keppra. He is also on Coumadin. The patient has had a flurry of seizures in September 2015 around the time he was placed on Zoloft in low dose. The patient also had brief seizures in April and June 2015. The patient does not operate a motor vehicle. He continues to go to the [facility] in [place], [place]. He gets most of his neurologic care in that facility. The patient recently had Dilantin levels, free and total, done on 03/25/2014. Dilantin level was 21.7, with a free level of 1.8.  Past Medical History  Diagnosis Date  . Glaucoma   . Hypercholesteremia   . Seizures   . Right frontal lobe lesion   . Blind left eye   . Cardiac arrest   . Mild left ventricular systolic dysfunction   . Hyperlipidemia   . Urinary urgency   . PSA elevation   . Atrial fibrillation   . Abnormality of gait 12/21/2013    Past Surgical History  Procedure Laterality Date  . Tonsillectomy      age 89  . Vasectomy  1985  . Colonoscopy    . Nasal sinus surgery    . Craniotomy Right     right anterior temporal resection  . Cardiac defibrillator placement    . Cataract surgery      Family History  Problem Relation Age of Onset  . Cancer - Lung Mother   . Cancer - Prostate Father     Social history:  reports that he has quit smoking. His smoking use included Cigarettes. He has a 7.5 pack-year smoking history. He has never used smokeless tobacco. He reports that he does not drink alcohol or use illicit drugs.    Allergies  Allergen Reactions  . Depakote [Divalproex Sodium] Other (See Comments)   Arthralgias     Medications:  Current Outpatient Prescriptions on File Prior to Visit  Medication Sig Dispense Refill  . ALPRAZolam (XANAX) 0.5 MG tablet Take 0.5 mg by mouth 2 (two) times daily.     . bimatoprost (LUMIGAN) 0.01 % SOLN Place 1 drop into both eyes at bedtime.    . brimonidine-timolol (COMBIGAN) 0.2-0.5 % ophthalmic solution Place 1 drop into both eyes every 12 (twelve) hours.    . finasteride (PROSCAR) 5 MG tablet Take 5 mg by mouth at bedtime.    . flecainide (TAMBOCOR) 50 MG tablet Take 50 mg by mouth daily.    Marland Kitchen levETIRAcetam (KEPPRA) 500 MG tablet Take 1,500 mg by mouth every 12 (twelve) hours.    . phenytoin (DILANTIN) 100 MG ER capsule Take 200 mg by mouth 2 (two) times daily.    . rosuvastatin (CRESTOR) 20 MG tablet Take 20 mg by mouth daily.    Marland Kitchen warfarin (COUMADIN) 5 MG tablet Take 5 mg by mouth daily. 5 Mg on Sunday 2.5 Mon-Sat     No current facility-administered medications on file prior to visit.    ROS:  Out of a complete 14 system review of symptoms, the patient complains only of the following symptoms, and all other reviewed systems are negative.  Constipation Insomnia Frequency of urination Itching  Bruising easily Seizures Mild confusion  Blood pressure 146/83, pulse 81, height 5\' 10"  (1.778 m), weight 167 lb 9.6 oz (76.023 kg).  Physical Exam  General: The patient is alert and cooperative at the time of the examination.  Skin: No significant peripheral edema is noted.   Neurologic Exam  Mental status: The patient is oriented x 3.  Cranial nerves: Facial symmetry is present. Speech is normal, no aphasia or dysarthria is noted. Extraocular movements are full. Visual fields are full.  Motor: The patient has good strength in all 4 extremities.  Sensory examination: Soft touch sensation is symmetric on the face, arms, and legs.  Coordination: The patient has good finger-nose-finger and heel-to-shin bilaterally.  Gait and station: The  patient has a normal gait. Tandem gait is minimally unsteady. Romberg is negative. No drift is seen.  Reflexes: Deep tendon reflexes are symmetric.   Assessment/Plan:  1. Intractable seizure disorder  The patient continues to have intermittent seizure events on 3 different seizure medications. He has had epilepsy surgery. The patient may be a candidate for a vagal nerve stimulator, and this has also been discussed with him at the [facility]. The patient will follow-up through this office in about 6 months. No adjustments were made to his medications at this time, as the Vimpat recently was increased by 50 mg daily.  Jill Alexanders MD 05/03/2014 8:55 PM  [facility] [address] [place], [place] [address]  Phone [phone] Fax [phone]

## 2014-05-05 ENCOUNTER — Encounter: Payer: Self-pay | Admitting: *Deleted

## 2014-08-16 HISTORY — PX: OTHER SURGICAL HISTORY: SHX169

## 2014-11-16 HISTORY — PX: IMPLANTATION VAGAL NERVE STIMULATOR: SUR692

## 2014-12-21 ENCOUNTER — Telehealth: Payer: Self-pay | Admitting: Neurology

## 2014-12-21 NOTE — Telephone Encounter (Signed)
I returned the patient's wife's call and left a voicemail.

## 2014-12-21 NOTE — Telephone Encounter (Signed)
Patients wife called and requested to speak with the nurse regarding some questions she has about he patients VAGUS NERVE STIMULATOR. Please call and advise.

## 2014-12-22 NOTE — Telephone Encounter (Signed)
I called the patient's wife. She wondered if we have the capability to adjust the VNS in the office. I told her we do.

## 2014-12-26 ENCOUNTER — Ambulatory Visit: Payer: Medicare Other | Admitting: Neurology

## 2015-01-09 ENCOUNTER — Ambulatory Visit (INDEPENDENT_AMBULATORY_CARE_PROVIDER_SITE_OTHER): Payer: Medicare Other | Admitting: Neurology

## 2015-01-09 ENCOUNTER — Encounter: Payer: Self-pay | Admitting: Neurology

## 2015-01-09 VITALS — BP 116/76 | HR 64 | Ht 69.0 in | Wt 165.0 lb

## 2015-01-09 DIAGNOSIS — G40309 Generalized idiopathic epilepsy and epileptic syndromes, not intractable, without status epilepticus: Secondary | ICD-10-CM | POA: Diagnosis not present

## 2015-01-09 DIAGNOSIS — G40219 Localization-related (focal) (partial) symptomatic epilepsy and epileptic syndromes with complex partial seizures, intractable, without status epilepticus: Secondary | ICD-10-CM | POA: Diagnosis not present

## 2015-01-09 DIAGNOSIS — G40901 Epilepsy, unspecified, not intractable, with status epilepticus: Secondary | ICD-10-CM

## 2015-01-09 DIAGNOSIS — R269 Unspecified abnormalities of gait and mobility: Secondary | ICD-10-CM | POA: Diagnosis not present

## 2015-01-09 NOTE — Progress Notes (Signed)
Reason for visit: Seizures  Cameron Potts is an 79 y.o. male  History of present illness:  Cameron Potts is a 79 year old right-handed white male with a history of intractable epilepsy. The patient has had epilepsy surgery which seemed to help initially, but the patient has had intractable recurrent seizures, currently on Keppra, Dilantin, and Vimpat without complete control. The patient has seizures on average once every 3 months or so. The patient has had a vagal nerve stimulator with the newer system placed in June 2016. The patient is followed through the [facility] in [place], [place]. The patient is getting injections every 2-3 weeks, ramping up on the settings, currently at 0.75 mA. The patient seems to be tolerating the stimulator fairly well. He has not had any seizures since May 2016 when he was in the hospital, seizures were induced. The patient has prolonged seizure events, occasionally transitioning into generalized tonic-clonic events. The patient returns for an evaluation. He does have chronic gait instability, he has fallen on one occasion recently when he slipped on a wet surface.  Past Medical History  Diagnosis Date  . Glaucoma   . Hypercholesteremia   . Seizures   . Right frontal lobe lesion   . Blind left eye   . Cardiac arrest   . Mild left ventricular systolic dysfunction   . Hyperlipidemia   . Urinary urgency   . PSA elevation   . Atrial fibrillation   . Abnormality of gait 12/21/2013    Past Surgical History  Procedure Laterality Date  . Tonsillectomy      age 85  . Vasectomy  1985  . Colonoscopy    . Nasal sinus surgery    . Craniotomy Right     right anterior temporal resection  . Cardiac defibrillator placement    . Cataract surgery    . Defibrillator replaced  March 2016  . Implantation vagal nerve stimulator  June 2016    Family History  Problem Relation Age of Onset  . Cancer - Lung Mother   . Cancer - Prostate Father     Social history:   reports that he has quit smoking. His smoking use included Cigarettes. He has a 7.5 pack-year smoking history. He has never used smokeless tobacco. He reports that he does not drink alcohol or use illicit drugs.    Allergies  Allergen Reactions  . Depakote [Divalproex Sodium] Other (See Comments)    Arthralgias     Medications:  Prior to Admission medications   Medication Sig Start Date End Date Taking? Authorizing Provider  ALPRAZolam Duanne Moron) 0.5 MG tablet Take 0.25 mg by mouth 2 (two) times daily.    Yes Historical Provider, MD  bimatoprost (LUMIGAN) 0.01 % SOLN Place 1 drop into both eyes at bedtime.   Yes Historical Provider, MD  brimonidine-timolol (COMBIGAN) 0.2-0.5 % ophthalmic solution Place 1 drop into both eyes every 12 (twelve) hours.   Yes Historical Provider, MD  Calcium Carbonate-Vitamin D (CALCIUM 600/VITAMIN D PO) Take 1 capsule by mouth daily.   Yes Historical Provider, MD  finasteride (PROSCAR) 5 MG tablet Take 5 mg by mouth at bedtime.   Yes Historical Provider, MD  flecainide (TAMBOCOR) 50 MG tablet Take 50 mg by mouth daily. 10/10/13  Yes Historical Provider, MD  lacosamide (VIMPAT) 200 MG TABS tablet Take 200 mg by mouth 2 (two) times daily. Take additional 50mg  at night   Yes Historical Provider, MD  lacosamide (VIMPAT) 50 MG TABS tablet Take 1 tablet (50  mg total) by mouth at bedtime. 05/03/14  Yes Kathrynn Ducking, MD  levETIRAcetam (KEPPRA) 500 MG tablet Take 1,500 mg by mouth every 12 (twelve) hours.   Yes Historical Provider, MD  metoprolol tartrate (LOPRESSOR) 25 MG tablet Take 25 mg by mouth 2 (two) times daily.   Yes Historical Provider, MD  phenytoin (DILANTIN) 100 MG ER capsule Take 200 mg by mouth 2 (two) times daily.   Yes Historical Provider, MD  rosuvastatin (CRESTOR) 20 MG tablet Take 20 mg by mouth daily.   Yes Historical Provider, MD  warfarin (COUMADIN) 5 MG tablet Take 2.5 mg by mouth daily.  10/06/13  Yes Historical Provider, MD    ROS:  Out of a  complete 14 system review of symptoms, the patient complains only of the following symptoms, and all other reviewed systems are negative.  Seizures  Blood pressure 116/76, pulse 64, height 5\' 9"  (1.753 m), weight 165 lb (74.844 kg).  Physical Exam  General: The patient is alert and cooperative at the time of the examination.  Skin: No significant peripheral edema is noted.   Neurologic Exam  Mental status: The patient is alert and oriented x 3 at the time of the examination. The patient has apparent normal recent and remote memory, with an apparently normal attention span and concentration ability.   Cranial nerves: Facial symmetry is present. Speech is normal, no aphasia or dysarthria is noted. Extraocular movements are full. Visual fields are full.  Motor: The patient has good strength in all 4 extremities.  Sensory examination: Soft touch sensation is symmetric on the face, arms, and legs.  Coordination: The patient has good finger-nose-finger and heel-to-shin bilaterally.  Gait and station: The patient has a normal gait. Tandem gait is minimally unsteady. Romberg is negative. No drift is seen.  Reflexes: Deep tendon reflexes are symmetric.   Assessment/Plan:  1. Intractable epilepsy  2. Gait disorder  The patient now has a vagal nerve stimulator with the new system in place. I do not have a device that will access this system, if the patient returns and requires interrogation and adjustments, the [employer] representative will need to be present. No adjustments in medications were done today, the patient will follow-up in one year, or sooner if needed. He has recently had blood levels done within the last 2 months. This was done in [place].   Cameron Alexanders MD 01/09/2015 7:45 PM  [facility] [address] [place], [place] [address]  Phone [phone] Fax [phone]

## 2015-01-09 NOTE — Patient Instructions (Signed)

## 2015-12-15 ENCOUNTER — Telehealth: Payer: Self-pay | Admitting: Neurology

## 2015-12-15 NOTE — Telephone Encounter (Signed)
Patient called to cancel July 24th appointment, is in [place]. Wanted Dr. Jannifer Franklin to know that the VNS helped with the one seizure he had and that he sees Neurologist at [facility] in [place], [place].

## 2016-01-08 ENCOUNTER — Ambulatory Visit: Payer: Medicare Other | Admitting: Neurology

## 2016-02-19 ENCOUNTER — Other Ambulatory Visit (HOSPITAL_COMMUNITY)
Admission: RE | Admit: 2016-02-19 | Discharge: 2016-02-19 | Disposition: A | Discharge status: Home / Self Care | Payer: Medicare Other | Source: Ambulatory Visit | Attending: Neurology | Admitting: Neurology

## 2016-02-19 DIAGNOSIS — I4891 Unspecified atrial fibrillation: Secondary | ICD-10-CM | POA: Insufficient documentation

## 2016-02-19 DIAGNOSIS — I4892 Unspecified atrial flutter: Secondary | ICD-10-CM | POA: Diagnosis present

## 2016-02-19 LAB — PROTIME-INR
INR: 2.61
Prothrombin Time: 28.4 seconds — ABNORMAL HIGH (ref 11.4–15.2)

## 2016-06-21 DIAGNOSIS — C44519 Basal cell carcinoma of skin of other part of trunk: Secondary | ICD-10-CM | POA: Diagnosis not present

## 2016-06-21 DIAGNOSIS — Z5181 Encounter for therapeutic drug level monitoring: Secondary | ICD-10-CM | POA: Diagnosis not present

## 2016-06-21 DIAGNOSIS — L089 Local infection of the skin and subcutaneous tissue, unspecified: Secondary | ICD-10-CM | POA: Diagnosis not present

## 2016-06-21 DIAGNOSIS — D485 Neoplasm of uncertain behavior of skin: Secondary | ICD-10-CM | POA: Diagnosis not present

## 2016-06-21 DIAGNOSIS — Z7901 Long term (current) use of anticoagulants: Secondary | ICD-10-CM | POA: Diagnosis not present

## 2016-06-21 DIAGNOSIS — H402220 Chronic angle-closure glaucoma, left eye, stage unspecified: Secondary | ICD-10-CM | POA: Diagnosis not present

## 2016-06-21 DIAGNOSIS — L570 Actinic keratosis: Secondary | ICD-10-CM | POA: Diagnosis not present

## 2016-06-21 DIAGNOSIS — G4089 Other seizures: Secondary | ICD-10-CM | POA: Diagnosis not present

## 2016-06-21 DIAGNOSIS — L853 Xerosis cutis: Secondary | ICD-10-CM | POA: Diagnosis not present

## 2016-06-21 DIAGNOSIS — L309 Dermatitis, unspecified: Secondary | ICD-10-CM | POA: Diagnosis not present

## 2016-06-21 DIAGNOSIS — R296 Repeated falls: Secondary | ICD-10-CM | POA: Diagnosis not present

## 2016-07-12 DIAGNOSIS — L570 Actinic keratosis: Secondary | ICD-10-CM | POA: Diagnosis not present

## 2016-07-19 DIAGNOSIS — R001 Bradycardia, unspecified: Secondary | ICD-10-CM | POA: Diagnosis not present

## 2016-07-19 DIAGNOSIS — H402220 Chronic angle-closure glaucoma, left eye, stage unspecified: Secondary | ICD-10-CM | POA: Diagnosis not present

## 2016-07-24 DIAGNOSIS — I480 Paroxysmal atrial fibrillation: Secondary | ICD-10-CM | POA: Diagnosis not present

## 2016-07-24 DIAGNOSIS — Z08 Encounter for follow-up examination after completed treatment for malignant neoplasm: Secondary | ICD-10-CM | POA: Diagnosis not present

## 2016-07-24 DIAGNOSIS — Z5181 Encounter for therapeutic drug level monitoring: Secondary | ICD-10-CM | POA: Diagnosis not present

## 2016-07-24 DIAGNOSIS — Z8551 Personal history of malignant neoplasm of bladder: Secondary | ICD-10-CM | POA: Diagnosis not present

## 2016-07-24 DIAGNOSIS — R972 Elevated prostate specific antigen [PSA]: Secondary | ICD-10-CM | POA: Diagnosis not present

## 2016-07-24 DIAGNOSIS — Z7901 Long term (current) use of anticoagulants: Secondary | ICD-10-CM | POA: Diagnosis not present

## 2016-07-30 DIAGNOSIS — R972 Elevated prostate specific antigen [PSA]: Secondary | ICD-10-CM | POA: Diagnosis not present

## 2016-07-30 DIAGNOSIS — N401 Enlarged prostate with lower urinary tract symptoms: Secondary | ICD-10-CM | POA: Diagnosis not present

## 2016-08-02 DIAGNOSIS — I10 Essential (primary) hypertension: Secondary | ICD-10-CM | POA: Diagnosis not present

## 2016-08-07 DIAGNOSIS — D485 Neoplasm of uncertain behavior of skin: Secondary | ICD-10-CM | POA: Diagnosis not present

## 2016-08-07 DIAGNOSIS — L570 Actinic keratosis: Secondary | ICD-10-CM | POA: Diagnosis not present

## 2016-08-07 DIAGNOSIS — C44622 Squamous cell carcinoma of skin of right upper limb, including shoulder: Secondary | ICD-10-CM | POA: Diagnosis not present

## 2016-08-13 DIAGNOSIS — I10 Essential (primary) hypertension: Secondary | ICD-10-CM | POA: Diagnosis not present

## 2016-08-15 DIAGNOSIS — L089 Local infection of the skin and subcutaneous tissue, unspecified: Secondary | ICD-10-CM | POA: Diagnosis not present

## 2016-08-21 DIAGNOSIS — I4891 Unspecified atrial fibrillation: Secondary | ICD-10-CM | POA: Diagnosis not present

## 2016-08-21 DIAGNOSIS — Z5181 Encounter for therapeutic drug level monitoring: Secondary | ICD-10-CM | POA: Diagnosis not present

## 2016-08-21 DIAGNOSIS — Z7901 Long term (current) use of anticoagulants: Secondary | ICD-10-CM | POA: Diagnosis not present

## 2016-08-23 DIAGNOSIS — C44629 Squamous cell carcinoma of skin of left upper limb, including shoulder: Secondary | ICD-10-CM | POA: Diagnosis not present

## 2016-09-03 DIAGNOSIS — I4892 Unspecified atrial flutter: Secondary | ICD-10-CM | POA: Diagnosis not present

## 2016-09-03 DIAGNOSIS — Z7901 Long term (current) use of anticoagulants: Secondary | ICD-10-CM | POA: Diagnosis not present

## 2016-09-03 DIAGNOSIS — G40209 Localization-related (focal) (partial) symptomatic epilepsy and epileptic syndromes with complex partial seizures, not intractable, without status epilepticus: Secondary | ICD-10-CM | POA: Diagnosis not present

## 2016-09-03 DIAGNOSIS — Z5181 Encounter for therapeutic drug level monitoring: Secondary | ICD-10-CM | POA: Diagnosis not present

## 2016-09-03 DIAGNOSIS — I4891 Unspecified atrial fibrillation: Secondary | ICD-10-CM | POA: Diagnosis not present

## 2016-09-16 DIAGNOSIS — Z95818 Presence of other cardiac implants and grafts: Secondary | ICD-10-CM | POA: Diagnosis not present

## 2016-09-24 DIAGNOSIS — I4892 Unspecified atrial flutter: Secondary | ICD-10-CM | POA: Diagnosis not present

## 2016-10-24 DIAGNOSIS — Z7901 Long term (current) use of anticoagulants: Secondary | ICD-10-CM | POA: Diagnosis not present

## 2016-10-26 DIAGNOSIS — G620 Drug-induced polyneuropathy: Secondary | ICD-10-CM | POA: Diagnosis present

## 2016-10-26 DIAGNOSIS — G40911 Epilepsy, unspecified, intractable, with status epilepticus: Secondary | ICD-10-CM | POA: Diagnosis not present

## 2016-10-26 DIAGNOSIS — Z95810 Presence of automatic (implantable) cardiac defibrillator: Secondary | ICD-10-CM | POA: Diagnosis not present

## 2016-10-26 DIAGNOSIS — I4892 Unspecified atrial flutter: Secondary | ICD-10-CM | POA: Diagnosis present

## 2016-10-26 DIAGNOSIS — R4182 Altered mental status, unspecified: Secondary | ICD-10-CM | POA: Diagnosis not present

## 2016-10-26 DIAGNOSIS — R791 Abnormal coagulation profile: Secondary | ICD-10-CM | POA: Diagnosis present

## 2016-10-26 DIAGNOSIS — G3184 Mild cognitive impairment, so stated: Secondary | ICD-10-CM | POA: Diagnosis present

## 2016-10-26 DIAGNOSIS — G40211 Localization-related (focal) (partial) symptomatic epilepsy and epileptic syndromes with complex partial seizures, intractable, with status epilepticus: Secondary | ICD-10-CM | POA: Diagnosis present

## 2016-10-26 DIAGNOSIS — E7800 Pure hypercholesterolemia, unspecified: Secondary | ICD-10-CM | POA: Diagnosis present

## 2016-10-26 DIAGNOSIS — Z7901 Long term (current) use of anticoagulants: Secondary | ICD-10-CM | POA: Diagnosis not present

## 2016-10-26 DIAGNOSIS — Z9689 Presence of other specified functional implants: Secondary | ICD-10-CM | POA: Diagnosis present

## 2016-10-26 DIAGNOSIS — Z5329 Procedure and treatment not carried out because of patient's decision for other reasons: Secondary | ICD-10-CM | POA: Diagnosis present

## 2016-10-26 DIAGNOSIS — F419 Anxiety disorder, unspecified: Secondary | ICD-10-CM | POA: Diagnosis present

## 2016-10-26 DIAGNOSIS — I10 Essential (primary) hypertension: Secondary | ICD-10-CM | POA: Diagnosis present

## 2016-10-26 DIAGNOSIS — R569 Unspecified convulsions: Secondary | ICD-10-CM | POA: Diagnosis not present

## 2016-10-26 DIAGNOSIS — R9431 Abnormal electrocardiogram [ECG] [EKG]: Secondary | ICD-10-CM | POA: Diagnosis not present

## 2016-10-26 DIAGNOSIS — I4891 Unspecified atrial fibrillation: Secondary | ICD-10-CM | POA: Diagnosis present

## 2016-10-27 DIAGNOSIS — R569 Unspecified convulsions: Secondary | ICD-10-CM | POA: Diagnosis not present

## 2016-10-29 DIAGNOSIS — G4089 Other seizures: Secondary | ICD-10-CM | POA: Diagnosis not present

## 2016-10-29 DIAGNOSIS — Z4542 Encounter for adjustment and management of neuropacemaker (brain) (peripheral nerve) (spinal cord): Secondary | ICD-10-CM | POA: Diagnosis not present

## 2016-10-29 DIAGNOSIS — G40209 Localization-related (focal) (partial) symptomatic epilepsy and epileptic syndromes with complex partial seizures, not intractable, without status epilepticus: Secondary | ICD-10-CM | POA: Diagnosis not present

## 2016-11-28 DIAGNOSIS — I4891 Unspecified atrial fibrillation: Secondary | ICD-10-CM | POA: Diagnosis not present

## 2016-11-28 DIAGNOSIS — Z0000 Encounter for general adult medical examination without abnormal findings: Secondary | ICD-10-CM | POA: Diagnosis not present

## 2016-11-28 DIAGNOSIS — Z95818 Presence of other cardiac implants and grafts: Secondary | ICD-10-CM | POA: Diagnosis not present

## 2016-11-28 DIAGNOSIS — Z7901 Long term (current) use of anticoagulants: Secondary | ICD-10-CM | POA: Diagnosis not present

## 2016-11-28 DIAGNOSIS — I4892 Unspecified atrial flutter: Secondary | ICD-10-CM | POA: Diagnosis not present

## 2016-11-28 DIAGNOSIS — Z5181 Encounter for therapeutic drug level monitoring: Secondary | ICD-10-CM | POA: Diagnosis not present

## 2016-12-03 DIAGNOSIS — Z125 Encounter for screening for malignant neoplasm of prostate: Secondary | ICD-10-CM | POA: Diagnosis not present

## 2016-12-03 DIAGNOSIS — R972 Elevated prostate specific antigen [PSA]: Secondary | ICD-10-CM | POA: Diagnosis not present

## 2016-12-03 DIAGNOSIS — M8580 Other specified disorders of bone density and structure, unspecified site: Secondary | ICD-10-CM | POA: Diagnosis not present

## 2016-12-03 DIAGNOSIS — R5383 Other fatigue: Secondary | ICD-10-CM | POA: Diagnosis not present

## 2016-12-26 DIAGNOSIS — H401131 Primary open-angle glaucoma, bilateral, mild stage: Secondary | ICD-10-CM | POA: Diagnosis not present

## 2016-12-30 DIAGNOSIS — H2511 Age-related nuclear cataract, right eye: Secondary | ICD-10-CM | POA: Diagnosis not present

## 2017-01-01 DIAGNOSIS — H2511 Age-related nuclear cataract, right eye: Secondary | ICD-10-CM | POA: Diagnosis not present

## 2017-01-04 ENCOUNTER — Observation Stay (HOSPITAL_COMMUNITY)
Admission: EM | Admit: 2017-01-04 | Discharge: 2017-01-05 | Disposition: A | Discharge status: Home / Self Care | Payer: Medicare Other | Attending: Internal Medicine | Admitting: Internal Medicine

## 2017-01-04 ENCOUNTER — Encounter (HOSPITAL_COMMUNITY): Payer: Self-pay | Admitting: Obstetrics and Gynecology

## 2017-01-04 DIAGNOSIS — I482 Chronic atrial fibrillation, unspecified: Secondary | ICD-10-CM

## 2017-01-04 DIAGNOSIS — E7800 Pure hypercholesterolemia, unspecified: Secondary | ICD-10-CM | POA: Insufficient documentation

## 2017-01-04 DIAGNOSIS — Z79899 Other long term (current) drug therapy: Secondary | ICD-10-CM | POA: Insufficient documentation

## 2017-01-04 DIAGNOSIS — R269 Unspecified abnormalities of gait and mobility: Secondary | ICD-10-CM | POA: Insufficient documentation

## 2017-01-04 DIAGNOSIS — G40201 Localization-related (focal) (partial) symptomatic epilepsy and epileptic syndromes with complex partial seizures, not intractable, with status epilepticus: Principal | ICD-10-CM | POA: Insufficient documentation

## 2017-01-04 DIAGNOSIS — G40211 Localization-related (focal) (partial) symptomatic epilepsy and epileptic syndromes with complex partial seizures, intractable, with status epilepticus: Secondary | ICD-10-CM

## 2017-01-04 DIAGNOSIS — R569 Unspecified convulsions: Secondary | ICD-10-CM

## 2017-01-04 DIAGNOSIS — Z87891 Personal history of nicotine dependence: Secondary | ICD-10-CM | POA: Insufficient documentation

## 2017-01-04 DIAGNOSIS — Z7902 Long term (current) use of antithrombotics/antiplatelets: Secondary | ICD-10-CM | POA: Diagnosis not present

## 2017-01-04 DIAGNOSIS — G40209 Localization-related (focal) (partial) symptomatic epilepsy and epileptic syndromes with complex partial seizures, not intractable, without status epilepticus: Secondary | ICD-10-CM | POA: Diagnosis present

## 2017-01-04 DIAGNOSIS — G40901 Epilepsy, unspecified, not intractable, with status epilepticus: Secondary | ICD-10-CM | POA: Diagnosis not present

## 2017-01-04 DIAGNOSIS — G40909 Epilepsy, unspecified, not intractable, without status epilepticus: Secondary | ICD-10-CM | POA: Diagnosis not present

## 2017-01-04 LAB — CBC WITH DIFFERENTIAL/PLATELET
BASOS ABS: 0.1 10*3/uL (ref 0.0–0.1)
BASOS PCT: 1 %
EOS PCT: 10 %
Eosinophils Absolute: 0.7 10*3/uL (ref 0.0–0.7)
HCT: 37.4 % — ABNORMAL LOW (ref 39.0–52.0)
Hemoglobin: 13 g/dL (ref 13.0–17.0)
Lymphocytes Relative: 34 %
Lymphs Abs: 2.3 10*3/uL (ref 0.7–4.0)
MCH: 32 pg (ref 26.0–34.0)
MCHC: 34.8 g/dL (ref 30.0–36.0)
MCV: 92.1 fL (ref 78.0–100.0)
Monocytes Absolute: 0.8 10*3/uL (ref 0.1–1.0)
Monocytes Relative: 11 %
NEUTROS ABS: 3 10*3/uL (ref 1.7–7.7)
Neutrophils Relative %: 44 %
PLATELETS: 219 10*3/uL (ref 150–400)
RBC: 4.06 MIL/uL — AB (ref 4.22–5.81)
RDW: 12.4 % (ref 11.5–15.5)
WBC: 6.8 10*3/uL (ref 4.0–10.5)

## 2017-01-04 LAB — BASIC METABOLIC PANEL
ANION GAP: 14 (ref 5–15)
BUN: 22 mg/dL — ABNORMAL HIGH (ref 6–20)
CALCIUM: 8.6 mg/dL — AB (ref 8.9–10.3)
CO2: 20 mmol/L — ABNORMAL LOW (ref 22–32)
Chloride: 101 mmol/L (ref 101–111)
Creatinine, Ser: 0.99 mg/dL (ref 0.61–1.24)
GFR calc Af Amer: >60 mL/min (ref >60)
Glucose, Bld: 113 mg/dL — ABNORMAL HIGH (ref 65–99)
Potassium: 4.4 mmol/L (ref 3.5–5.1)
SODIUM: 135 mmol/L (ref 135–145)

## 2017-01-04 LAB — URINALYSIS, ROUTINE W REFLEX MICROSCOPIC
Bilirubin Urine: NEGATIVE
GLUCOSE, UA: NEGATIVE mg/dL
Hgb urine dipstick: NEGATIVE
Ketones, ur: NEGATIVE mg/dL
LEUKOCYTES UA: NEGATIVE
NITRITE: NEGATIVE
Protein, ur: NEGATIVE mg/dL
Specific Gravity, Urine: 1.019 (ref 1.005–1.030)
pH: 7 (ref 5.0–8.0)

## 2017-01-04 LAB — PROTIME-INR
INR: 2.18
Prothrombin Time: 24.6 seconds — ABNORMAL HIGH (ref 11.4–15.2)

## 2017-01-04 LAB — PHENYTOIN LEVEL, TOTAL: Phenytoin Lvl: 15.1 ug/mL (ref 10.0–20.0)

## 2017-01-04 LAB — GLUCOSE, CAPILLARY: GLUCOSE-CAPILLARY: 91 mg/dL (ref 65–99)

## 2017-01-04 LAB — MRSA PCR SCREENING: MRSA by PCR: NEGATIVE

## 2017-01-04 LAB — MAGNESIUM: MAGNESIUM: 2.1 mg/dL (ref 1.7–2.4)

## 2017-01-04 MED ORDER — SODIUM CHLORIDE 0.9 % IV SOLN
INTRAVENOUS | Status: DC
  Administered 2017-01-04: 18:00:00 via INTRAVENOUS

## 2017-01-04 MED ORDER — WARFARIN SODIUM 5 MG PO TABS
2.5000 mg | ORAL_TABLET | Freq: Once | ORAL | Status: AC
  Administered 2017-01-04: 2.5 mg via ORAL
  Filled 2017-01-04: qty 1

## 2017-01-04 MED ORDER — LORAZEPAM 2 MG/ML IJ SOLN
1.0000 mg | Freq: Once | INTRAMUSCULAR | Status: AC
  Administered 2017-01-04: 2 mg via INTRAVENOUS
  Filled 2017-01-04: qty 1

## 2017-01-04 MED ORDER — SODIUM CHLORIDE 0.9 % IV SOLN
1000.0000 mg | Freq: Once | INTRAVENOUS | Status: AC
  Administered 2017-01-04: 1000 mg via INTRAVENOUS
  Filled 2017-01-04: qty 10

## 2017-01-04 MED ORDER — ONDANSETRON HCL 4 MG/2ML IJ SOLN: 4.0000 mg | Freq: Four times a day (QID) | INTRAMUSCULAR | Status: DC | PRN

## 2017-01-04 MED ORDER — SODIUM CHLORIDE 0.9 % IV SOLN
200.0000 mg | INTRAVENOUS | Status: AC
  Administered 2017-01-04: 200 mg via INTRAVENOUS
  Filled 2017-01-04: qty 20

## 2017-01-04 MED ORDER — FLECAINIDE ACETATE 50 MG PO TABS
50.0000 mg | ORAL_TABLET | Freq: Two times a day (BID) | ORAL | Status: DC
  Administered 2017-01-05: 50 mg via ORAL
  Filled 2017-01-04 (×3): qty 1

## 2017-01-04 MED ORDER — PHENYTOIN SODIUM EXTENDED 100 MG PO CAPS: 200.0000 mg | ORAL_CAPSULE | Freq: Two times a day (BID) | ORAL | Status: DC

## 2017-01-04 MED ORDER — FINASTERIDE 5 MG PO TABS
5.0000 mg | ORAL_TABLET | Freq: Every day | ORAL | Status: DC
  Administered 2017-01-05: 5 mg via ORAL
  Filled 2017-01-04: qty 1

## 2017-01-04 MED ORDER — TIMOLOL MALEATE 0.5 % OP SOLN
1.0000 [drp] | Freq: Two times a day (BID) | OPHTHALMIC | Status: DC
  Administered 2017-01-05: 1 [drp] via OPHTHALMIC
  Filled 2017-01-04: qty 5

## 2017-01-04 MED ORDER — SODIUM CHLORIDE 0.9 % IV SOLN
1500.0000 mg | Freq: Two times a day (BID) | INTRAVENOUS | Status: DC
  Administered 2017-01-05: 1500 mg via INTRAVENOUS
  Filled 2017-01-04 (×3): qty 15

## 2017-01-04 MED ORDER — CALCIUM CARBONATE-VITAMIN D 500-200 MG-UNIT PO TABS
1.0000 | ORAL_TABLET | Freq: Every day | ORAL | Status: DC
  Filled 2017-01-04: qty 1

## 2017-01-04 MED ORDER — SODIUM CHLORIDE 0.9 % IV SOLN
50.0000 mg | INTRAVENOUS | Status: AC
  Administered 2017-01-04: 50 mg via INTRAVENOUS
  Filled 2017-01-04: qty 5

## 2017-01-04 MED ORDER — SODIUM CHLORIDE 0.9 % IV SOLN
75.0000 mL/h | INTRAVENOUS | Status: DC
  Administered 2017-01-04: 75 mL/h via INTRAVENOUS

## 2017-01-04 MED ORDER — ONDANSETRON HCL 4 MG PO TABS: 4.0000 mg | ORAL_TABLET | Freq: Four times a day (QID) | ORAL | Status: DC | PRN

## 2017-01-04 MED ORDER — BRIMONIDINE TARTRATE-TIMOLOL 0.2-0.5 % OP SOLN: 1.0000 [drp] | Freq: Two times a day (BID) | OPHTHALMIC | Status: DC

## 2017-01-04 MED ORDER — BRIMONIDINE TARTRATE 0.2 % OP SOLN
1.0000 [drp] | Freq: Two times a day (BID) | OPHTHALMIC | Status: DC
  Administered 2017-01-05: 1 [drp] via OPHTHALMIC
  Filled 2017-01-04: qty 5

## 2017-01-04 MED ORDER — SODIUM CHLORIDE 0.9 % IV BOLUS (SEPSIS)
500.0000 mL | Freq: Once | INTRAVENOUS | Status: AC
  Administered 2017-01-04: 500 mL via INTRAVENOUS

## 2017-01-04 MED ORDER — LEVETIRACETAM 750 MG PO TABS: 1500.0000 mg | ORAL_TABLET | Freq: Two times a day (BID) | ORAL | Status: DC

## 2017-01-04 MED ORDER — ALPRAZOLAM 0.25 MG PO TABS
0.2500 mg | ORAL_TABLET | Freq: Two times a day (BID) | ORAL | Status: DC
  Administered 2017-01-04 – 2017-01-05 (×2): 0.25 mg via ORAL
  Filled 2017-01-04 (×2): qty 1

## 2017-01-04 MED ORDER — WARFARIN - PHARMACIST DOSING INPATIENT: Freq: Every day | Status: DC

## 2017-01-04 MED ORDER — LACOSAMIDE 200 MG/20ML IV SOLN
250.0000 mg | Freq: Two times a day (BID) | INTRAVENOUS | Status: DC
  Administered 2017-01-05: 250 mg via INTRAVENOUS
  Filled 2017-01-04 (×3): qty 25

## 2017-01-04 MED ORDER — SODIUM CHLORIDE 0.9 % IV SOLN
200.0000 mg | Freq: Two times a day (BID) | INTRAVENOUS | Status: DC
  Administered 2017-01-05: 200 mg via INTRAVENOUS
  Filled 2017-01-04 (×2): qty 4

## 2017-01-04 MED ORDER — METOPROLOL TARTRATE 25 MG PO TABS
25.0000 mg | ORAL_TABLET | Freq: Two times a day (BID) | ORAL | Status: DC
  Administered 2017-01-04: 25 mg via ORAL
  Filled 2017-01-04: qty 1

## 2017-01-04 MED ORDER — BISACODYL 10 MG RE SUPP: 10.0000 mg | Freq: Every day | RECTAL | Status: DC | PRN

## 2017-01-04 MED ORDER — LACOSAMIDE 50 MG PO TABS: 200.0000 mg | ORAL_TABLET | Freq: Two times a day (BID) | ORAL | Status: DC

## 2017-01-04 MED ORDER — ROSUVASTATIN CALCIUM 20 MG PO TABS: 20.0000 mg | ORAL_TABLET | Freq: Every day | ORAL | Status: DC

## 2017-01-04 MED ORDER — LACOSAMIDE 50 MG PO TABS: 50.0000 mg | ORAL_TABLET | Freq: Every day | ORAL | Status: DC

## 2017-01-04 MED ORDER — LATANOPROST 0.005 % OP SOLN
1.0000 [drp] | Freq: Every day | OPHTHALMIC | Status: DC
  Administered 2017-01-04: 1 [drp] via OPHTHALMIC
  Filled 2017-01-04: qty 2.5

## 2017-01-04 NOTE — ED Provider Notes (Signed)
[facility] DEPT Provider Note   CSN: [id number] Arrival date & time: 01/04/17  1715     History   Chief Complaint Chief Complaint  Patient presents with  . Seizures    HPI Cameron Potts is a 81 y.o. male.  He presents for evaluation of a prolonged seizure, which did not stop when his vagal nerve stimulator was activated.  EMS arrived and transported him.  During transport he had a left-sided seizure, which generalized tonic clonic.  He was subsequently treated with midazolam 2.5 mg.  After that his generalized seizure apparently stopped.  Patient is unable to give any history.  His wife is here with him and is concerned that he has not had his 4 PM medications.  Also, she states that he has not had any illnesses recently.  Level 5 caveat-altered mental status  HPI  Past Medical History:  Diagnosis Date  . Abnormality of gait 12/21/2013  . Atrial fibrillation ([place])   . Blind left eye   . Cardiac arrest ([place])   . Glaucoma   . Hypercholesteremia   . Hyperlipidemia   . Mild left ventricular systolic dysfunction   . PSA elevation   . Right frontal lobe lesion   . Seizures ([place])   . Urinary urgency     Patient Active Problem List   Diagnosis Date Noted  . Abnormality of gait 12/21/2013  . Complex partial seizure ([place]) 04/11/2013  . Seizure disorder, nonconvulsive, with status epilepticus ([place]) 04/11/2013    Past Surgical History:  Procedure Laterality Date  . CARDIAC DEFIBRILLATOR PLACEMENT    . cataract surgery    . COLONOSCOPY    . CRANIOTOMY Right    right anterior temporal resection  . defibrillator replaced  March 2016  . IMPLANTATION VAGAL NERVE STIMULATOR  June 2016  . NASAL SINUS SURGERY    . TONSILLECTOMY     age 56  . [place] Medications    Prior to Admission medications   Medication Sig Start Date End Date Taking? Authorizing Provider  ALPRAZolam Duanne Moron) 0.5 MG tablet Take 0.25 mg by mouth 2 (two) times daily.     [provider]  bimatoprost (LUMIGAN) 0.01 % SOLN Place 1 drop into both eyes at bedtime.    [provider]  brimonidine-timolol (COMBIGAN) 0.2-0.5 % ophthalmic solution Place 1 drop into both eyes every 12 (twelve) hours.    [provider]  Calcium Carbonate-Vitamin D (CALCIUM 600/VITAMIN D PO) Take 1 capsule by mouth daily.    [provider]  finasteride (PROSCAR) 5 MG tablet Take 5 mg by mouth at bedtime.    [provider]  flecainide (TAMBOCOR) 50 MG tablet Take 50 mg by mouth daily. 10/10/13   [provider]  lacosamide (VIMPAT) 200 MG TABS tablet Take 200 mg by mouth 2 (two) times daily. Take additional 50mg  at night    [provider]  lacosamide (VIMPAT) 50 MG TABS tablet Take 1 tablet (50 mg total) by mouth at bedtime. 05/03/14   Kathrynn Ducking, MD  levETIRAcetam (KEPPRA) 500 MG tablet Take 1,500 mg by mouth every 12 (twelve) hours.    [provider]  metoprolol tartrate (LOPRESSOR) 25 MG tablet Take 25 mg by mouth 2 (two) times daily.    [provider]  phenytoin (DILANTIN) 100 MG ER capsule Take 200 mg by mouth 2 (two) times daily.    [provider]  rosuvastatin (CRESTOR) 20 MG  tablet Take 20 mg by mouth daily.    [provider]  warfarin (COUMADIN) 5 MG tablet Take 2.5 mg by mouth daily.  10/06/13   [provider]    Family History Family History  Problem Relation Age of Onset  . Cancer - Lung Mother   . Cancer - Prostate Father     Social History Social History  Substance Use Topics  . Smoking status: Former Smoker    Packs/day: 0.75    Years: 10.00    Types: Cigarettes  . Smokeless tobacco: Never Used     Comment: quit 1960s  . Alcohol use No     Comment: former beer drinker     Allergies   Depakote [divalproex sodium]   Review of Systems Review of Systems  Unable to perform ROS: Mental status change     Physical Exam Updated Vital Signs BP (!)  146/62 (BP Location: Left Arm) Comment: Simultaneous filing. User may not have seen previous data.  Pulse 63 Comment: Simultaneous filing. User may not have seen previous data.  Temp 98.2 F (36.8 C) (Axillary)   Resp 12 Comment: Simultaneous filing. User may not have seen previous data.  SpO2 95% Comment: Simultaneous filing. User may not have seen previous data.  Physical Exam  Constitutional: He is oriented to person, place, and time. He appears well-developed. He appears distressed.  Elderly, frail  HENT:  Head: Normocephalic and atraumatic.  Right Ear: External ear normal.  Left Ear: External ear normal.  Eyes: Pupils are equal, round, and reactive to light. Conjunctivae and EOM are normal.  Neck: Normal range of motion and phonation normal. Neck supple.  Cardiovascular: Normal rate, regular rhythm and normal heart sounds.   Pulmonary/Chest: Effort normal and breath sounds normal. No stridor. No respiratory distress. He exhibits no bony tenderness.  Abdominal: Soft. There is no tenderness.  Musculoskeletal: Normal range of motion.  Neurological: He is alert and oriented to person, place, and time.  Obtunded.  No clear gaze deviation.  Eyes open.  Flaccid left-sided hemiparesis.  Skin: Skin is warm, dry and intact.  Psychiatric:  He is lethargic  Nursing note and vitals reviewed.    ED Treatments / Results  Labs (all labs ordered are listed, but only abnormal results are displayed) Labs Reviewed  BASIC METABOLIC PANEL - Abnormal; Notable for the following:       Result Value   CO2 20 (*)    Glucose, Bld 113 (*)    BUN 22 (*)    Calcium 8.6 (*)    All other components within normal limits  CBC WITH DIFFERENTIAL/PLATELET - Abnormal; Notable for the following:    RBC 4.06 (*)    HCT 37.4 (*)    All other components within normal limits  URINALYSIS, ROUTINE W REFLEX MICROSCOPIC  PHENYTOIN LEVEL, TOTAL  CBG MONITORING, ED    EKG  EKG Interpretation None        Radiology No results found.  Procedures Procedures (including critical care time)  Medications Ordered in ED Medications  0.9 %  sodium chloride infusion ( Intravenous New Bag/Given 01/04/17 1758)  levETIRAcetam (KEPPRA) 1,000 mg in sodium chloride 0.9 % 100 mL IVPB (not administered)  sodium chloride 0.9 % bolus 500 mL (0 mLs Intravenous Stopped 01/04/17 1814)  LORazepam (ATIVAN) injection 1 mg (2 mg Intravenous Given 01/04/17 1821)     Initial Impression / Assessment and Plan / ED Course  I have reviewed the triage vital signs and the nursing  notes.  Pertinent labs & imaging results that were available during my care of the patient were reviewed by me and considered in my medical decision making (see chart for details).  Clinical Course as of Jan 04 1899  Sat Jan 04, 2017  1816 Call to room by wife, because she thinks he is seizing.  At this time he has visible left gaze preference, twitching of the head, and increased tone left body, consistent with recurrent seizure activity  [EW]  1821 Partial seizure generalized, tonic-clonic activity, which lasted about 2 minutes, during which time he was given Ativan 2 mg IV.  Patient was rolled to the left side, suctioned, and had some bloody mucus, in the mouth.  Additional medications have been ordered, will contact neurology, and plan on transfer/admission, at [facility].  [EW]  1854 He has been seen by the neuro hospitalist, who advises admission by hospitalist service, and transfer to [facility] for close observation and management by both hospitalist and neuro hospitalist service.  [EW]    Clinical Course User Index [EW] Daleen Bo, MD     Patient Vitals for the past 24 hrs:  BP Temp Temp src Pulse Resp SpO2  01/04/17 1715 (!) 146/62 98.2 F (36.8 C) Axillary 63 12 95 %   18: 45-he has been seen by the neuro hospitalist, who advises admission, and that the patient is "not in status epilepticus" at this time.  7:00 PM  Reevaluation with update and discussion. After initial assessment and treatment, an updated evaluation reveals he is resting comfortably at this time with his eyes open.  No evidence of seizure activity at this time.Daleen Bo L   18:52 PM-Consult complete with hospitalist. Patient case explained and discussed.  He agrees to admit patient for further evaluation and treatment. Call ended at 62: 5 8  CRITICAL CARE Performed by: Daleen Bo L Total critical care time: 40 minutes Critical care time was exclusive of separately billable procedures and treating other patients. Critical care was necessary to treat or prevent imminent or life-threatening deterioration. Critical care was time spent personally by me on the following activities: development of treatment plan with patient and/or surrogate as well as nursing, discussions with consultants, evaluation of patient's response to treatment, examination of patient, obtaining history from patient or surrogate, ordering and performing treatments and interventions, ordering and review of laboratory studies, ordering and review of radiographic studies, pulse oximetry and re-evaluation of patient's condition.   Final Clinical Impressions(s) / ED Diagnoses   Final diagnoses:  Seizure ([place])    Multiple seizures, initially partial, left-sided, generalizing, twice within a short period time.  Screening evaluation initiated, and neurology consulted.  Seizures controlled in the ED setting.  Patient is not in status epilepticus.  No evidence for metabolic encephalopathy, electrolyte abnormality, or new CVA.  Nursing Notes Reviewed/ Care Coordinated Applicable Imaging Reviewed Interpretation of Laboratory Data incorporated into ED treatment   Plan: Admit.  Patient will require close monitoring by neurology and possibly urgent EEG, if he continues to seize.  New Prescriptions New Prescriptions   No medications on file     Daleen Bo,  MD 01/04/17 2340

## 2017-01-04 NOTE — Progress Notes (Signed)
ANTICOAGULATION CONSULT NOTE - Initial Consult  Pharmacy Consult for Coumadin Indication: atrial fibrillation  Allergies  Allergen Reactions  . Depakote [Divalproex Sodium] Other (See Comments)    Arthralgias     Patient Measurements:     Vital Signs: Temp: 98.2 F (36.8 C) (07/21 1715) Temp Source: Axillary (07/21 1715) BP: 146/62 (07/21 1715) Pulse Rate: 63 (07/21 1715)  Labs:  Recent Labs  01/04/17 1740 01/04/17 1936  HGB 13.0  --   HCT 37.4*  --   PLT 219  --   LABPROT  --  24.6*  INR  --  2.18  CREATININE 0.99  --     CrCl cannot be calculated (Unknown ideal weight.).   Medical History: Past Medical History:  Diagnosis Date  . Abnormality of gait 12/21/2013  . Atrial fibrillation ([place])   . Blind left eye   . Cardiac arrest ([place])   . Glaucoma   . Hypercholesteremia   . Hyperlipidemia   . Mild left ventricular systolic dysfunction   . PSA elevation   . Right frontal lobe lesion   . Seizures ([place])   . Urinary urgency     Medications:  Scheduled:   Infusions:  . sodium chloride 125 mL/hr at 01/04/17 1758  . [START ON 01/05/2017] lacosamide (VIMPAT) IV    . levETIRAcetam    . phenytoin (DILANTIN) IV     PRN:   Assessment: 81 yo male on chronic coumadin for afib admitted after a seizure. Pharmacy is consulted to continue coumadin dosing while inpatient.  INR therapeutic at 2.18  CBC wnl  No bleeding documented  No new drug drug interactions noted (phenytoin is a chronic med)  Goal of Therapy:  INR 2-3   Plan:   Coumadin 2.5mg  PO x 1 tonight per home regimen  Daily PT/INR  Peggyann Juba, PharmD, BCPS Pager: [phone] 01/04/2017,8:24 PM

## 2017-01-04 NOTE — ED Triage Notes (Signed)
Per EMS:  Pt from home, Pt has a hx of seizures. Pt had not taken his Kepra today. Pt has a vagal nerve stimulator and a pacemaker. Wife attempted to use the vagal nerve stimulator X3 with no success.  Pt had a focal seizure that lead to a grand mal seizure with EMS.  2.5 mg of midazolam given en route

## 2017-01-04 NOTE — Consult Note (Signed)
Neurology Consultation Reason for Consult: Seizures Referring Physician: Eulis Foster, E  CC: Seizures  History is obtained from: Wife  HPI: Cameron Potts is a 81 y.o. male with a history of refractory seizures who is [facility] at the [facility]. He was in his normal state of health, not missing any medications until around 4:10 PM when he began having focal seizures. His wife states that his typical seizures left-sided twitching, and he had recurrent seizures, with questionable return to baseline until arriving here. Following his arrival, he had a secondary generalization and received 2 mg of IV Ativan and following this his seizures finally stopped.  Currently, she states that his mental status is improving. He has a history of left eye paresis following seizures in the past.   ROS:  Unable to obtain due to altered mental status.   Past Medical History:  Diagnosis Date  . Abnormality of gait 12/21/2013  . Atrial fibrillation ([place])   . Blind left eye   . Cardiac arrest ([place])   . Glaucoma   . Hypercholesteremia   . Hyperlipidemia   . Mild left ventricular systolic dysfunction   . PSA elevation   . Right frontal lobe lesion   . Seizures ([place])   . Urinary urgency      Family History  Problem Relation Age of Onset  . Cancer - Lung Mother   . Cancer - Prostate Father      Social History:  reports that he has quit smoking. His smoking use included Cigarettes. He has a 7.50 pack-year smoking history. He has never used smokeless tobacco. He reports that he does not drink alcohol or use drugs.   Exam: Current vital signs: BP (!) 146/62 (BP Location: Left Arm) Comment: Simultaneous filing. User may not have seen previous data.  Pulse 63 Comment: Simultaneous filing. User may not have seen previous data.  Temp 98.2 F (36.8 C) (Axillary)   Resp 12 Comment: Simultaneous filing. User may not have seen previous data.  SpO2 95% Comment: Simultaneous filing. User may not have seen previous  data. Vital signs in last 24 hours: Temp:  [98.2 F (36.8 C)] 98.2 F (36.8 C) (07/21 1715) Pulse Rate:  [63] 63 (07/21 1715) Resp:  [12] 12 (07/21 1715) BP: (146)/(62) 146/62 (07/21 1715) SpO2:  [95 %] 95 % (07/21 1715)   Physical Exam  Constitutional: Appears well-developed and well-nourished.  Psych: Affect appropriate to situation Eyes: No scleral injection HENT: No OP obstrucion Head: Normocephalic.  Cardiovascular: Normal rate and regular rhythm.  Respiratory: Effort normal and breath sounds normal to anterior ascultation GI: Soft.  No distension. There is no tenderness.  Skin: WDI  Neuro: Mental Status: Patient is awake, alert, he thinks that his of the [facility] over the course of my exam his mentation was continuing to improve and he was answering questions more clearly.  No signs of aphasia, he clearly has a left hemi-neglect Cranial Nerves: II: Left hemianopia. Pupils are equal, round, and reactive to light.   III,IV, VI: Though he does cross midline to the left single time, he has a right gaze preference V: He endorses symmetric facial sensation, though he is neglecting the left VII: Facial movement is weak on the left Motor: He has a flaccid left side, no movement. He is able to lift up both right arm and leg against gravity. Sensory: He endorses symmetric sensation, but is unable to volunteer when he is being touched on the left side Cerebellar: No clear ataxia on  the right   I have reviewed labs in epic and the results pertinent to this consultation are: BMP-unremarkable  Impression: 81 year old male with focal status epilepticus that is now resolved status post Ativan. Though he continues to have some left hemiparesis he is rapidly improving and therefore I do not think he has ongoing seizure activity. I do think that waiting until Monday for an EEG is according to be ideal and therefore I will request to have him transferred to [facility].  Recommendations: 1) home Vimpat 250 mg twice a day 2) Dilantin 200 mg twice a day 3) home Keppra 1500 mg twice a day, additional dose now 4) repeat Ativan for any further seizures  5) frequent neuro checks.  6) neurology will continue to follow   Roland Rack, MD [facility] [phone]  If 7pm- 7am, please page neurology on call as listed in [place].

## 2017-01-04 NOTE — ED Notes (Signed)
Pt's family requesting MD to come speak to them. MD made aware

## 2017-01-04 NOTE — ED Notes (Signed)
Pt had seizure activity. Pt was suctioned by nurse and given ativan per MD verbal order

## 2017-01-04 NOTE — ED Notes (Signed)
ED Provider at bedside. 

## 2017-01-04 NOTE — ED Notes (Signed)
Neurologist at bedside. 

## 2017-01-04 NOTE — H&P (Signed)
History and Physical    Cameron Potts GYI:[id number] DOB: 10/12/1935 DOA: 01/04/2017  Referring MD/NP/PA:  PCP: Patient, No Pcp Per  Outpatient Specialists:  Patient coming from: Home  Chief Complaint: Seizures  HPI: Cameron Potts is a 81 y.o. male with medical history significant for but not limited to seizure disorder and atrial fibrillation on chronic anticoagulation presenting with status epilepticus. Patient is sedated and unable to give consistent history, see front of him to ED physicians and wife by phone as well as son at bedside.  Abdomen is ingested of health until sometime late this afternoon when he started having seizures which was prolonged and not controlled by vagal nerve stimulation. EMS was activated and was said to have had left-sided generalized tonic-clonic seizure activity for which reason he received midazolam 2.5 mg after which his symptoms resolved. No history of any recent intercurrent illnesses  ED Course: At the ED patient was hemodynamically stable but lethargic. He was started on IV fluids. He had an episode of seizure was emergency room and received 2 mg IV lorazepam with delusions and was initiated on loading dose of Keppra.  Review of Systems:  unable to give history  Past Medical History:  Diagnosis Date  . Abnormality of gait 12/21/2013  . Atrial fibrillation ([place])   . Blind left eye   . Cardiac arrest ([place])   . Glaucoma   . Hypercholesteremia   . Hyperlipidemia   . Mild left ventricular systolic dysfunction   . PSA elevation   . Right frontal lobe lesion   . Seizures ([place])   . Urinary urgency     Past Surgical History:  Procedure Laterality Date  . CARDIAC DEFIBRILLATOR PLACEMENT    . cataract surgery    . COLONOSCOPY    . CRANIOTOMY Right    right anterior temporal resection  . defibrillator replaced  March 2016  . IMPLANTATION VAGAL NERVE STIMULATOR  June 2016  . NASAL SINUS SURGERY    . TONSILLECTOMY     age 48  . VASECTOMY  1985     reports that he has quit smoking. His smoking use included Cigarettes. He has a 7.50 pack-year smoking history. He has never used smokeless tobacco. He reports that he does not drink alcohol or use drugs.  Allergies  Allergen Reactions  . Depakote [Divalproex Sodium] Other (See Comments)    Arthralgias     Family History  Problem Relation Age of Onset  . Cancer - Lung Mother   . Cancer - Prostate Father      Prior to Admission medications   Medication Sig Start Date End Date Taking? Authorizing Provider  ALPRAZolam Duanne Moron) 0.5 MG tablet Take 0.25 mg by mouth 2 (two) times daily.     [provider]  bimatoprost (LUMIGAN) 0.01 % SOLN Place 1 drop into both eyes at bedtime.    [provider]  brimonidine-timolol (COMBIGAN) 0.2-0.5 % ophthalmic solution Place 1 drop into both eyes every 12 (twelve) hours.    [provider]  Calcium Carbonate-Vitamin D (CALCIUM 600/VITAMIN D PO) Take 1 capsule by mouth daily.    [provider]  finasteride (PROSCAR) 5 MG tablet Take 5 mg by mouth at bedtime.    [provider]  flecainide (TAMBOCOR) 50 MG tablet Take 50 mg by mouth daily. 10/10/13   [provider]  lacosamide (VIMPAT) 200 MG TABS tablet Take 200 mg by mouth 2 (two) times daily. Take additional 50mg  at night    [provider]  lacosamide (VIMPAT) 50 MG TABS tablet Take 1 tablet (50 mg total) by mouth at bedtime. 05/03/14   Kathrynn Ducking, MD  levETIRAcetam (KEPPRA) 500 MG tablet Take 1,500 mg by mouth every 12 (twelve) hours.    [provider]  metoprolol tartrate (LOPRESSOR) 25 MG tablet Take 25 mg by mouth 2 (two) times daily.    [provider]  phenytoin (DILANTIN) 100 MG ER capsule Take 200 mg by mouth 2 (two) times daily.    [provider]  rosuvastatin (CRESTOR) 20 MG tablet Take 20 mg by mouth daily.    [provider]  warfarin (COUMADIN) 5 MG tablet Take 2.5 mg by mouth  daily.  10/06/13   [provider]    Physical Exam: Vitals:   01/04/17 1715  BP: (!) 146/62  Pulse: 63  Resp: 12  Temp: 98.2 F (36.8 C)  TempSrc: Axillary  SpO2: 95%      Constitutional: NAD, calm, comfortable Vitals:   01/04/17 1715  BP: (!) 146/62  Pulse: 63  Resp: 12  Temp: 98.2 F (36.8 C)  TempSrc: Axillary  SpO2: 95%   Eyes: PERRL, lids and conjunctivae normal ENMT: Mucous membranes are moist. Posterior pharynx clear of any exudate or lesions.Normal dentition.  Neck: normal, supple, no masses, no thyromegaly Respiratory: clear to auscultation bilaterally, no wheezing, no crackles. Normal respiratory effort. No accessory muscle use.  Cardiovascular: Regular rate and rhythm, no murmurs / rubs / gallops. No extremity edema. 2+ pedal pulses. No carotid bruits.  Abdomen: no tenderness, no masses palpated. No hepatosplenomegaly. Bowel sounds positive.  Musculoskeletal: no clubbing / cyanosis. Skin: no rashes, lesions, ulcers. No induration Neurologic: Lethargic but arousable, no obvious acute focal deficit noted .    Labs on Admission: I have personally reviewed following labs and imaging studies  CBC:  Recent Labs Lab 01/04/17 1740  WBC 6.8  NEUTROABS 3.0  HGB 13.0  HCT 37.4*  MCV 92.1  PLT 865   Basic Metabolic Panel:  Recent Labs Lab 01/04/17 1740  NA 135  K 4.4  CL 101  CO2 20*  GLUCOSE 113*  BUN 22*  CREATININE 0.99  CALCIUM 8.6*   GFR: CrCl cannot be calculated (Unknown ideal weight.). Liver Function Tests: No results for input(s): AST, ALT, ALKPHOS, BILITOT, PROT, ALBUMIN in the last 168 hours. No results for input(s): LIPASE, AMYLASE in the last 168 hours. No results for input(s): AMMONIA in the last 168 hours. Coagulation Profile: No results for input(s): INR, PROTIME in the last 168 hours. Cardiac Enzymes: No results for input(s): CKTOTAL, CKMB, CKMBINDEX, TROPONINI in the last 168 hours. BNP (last 3 results) No  results for input(s): PROBNP in the last 8760 hours. HbA1C: No results for input(s): HGBA1C in the last 72 hours. CBG: No results for input(s): GLUCAP in the last 168 hours. Lipid Profile: No results for input(s): CHOL, HDL, LDLCALC, TRIG, CHOLHDL, LDLDIRECT in the last 72 hours. Thyroid Function Tests: No results for input(s): TSH, T4TOTAL, FREET4, T3FREE, THYROIDAB in the last 72 hours. Anemia Panel: No results for input(s): VITAMINB12, FOLATE, FERRITIN, TIBC, IRON, RETICCTPCT in the last 72 hours. Urine analysis:    Component Value Date/Time   COLORURINE YELLOW 04/10/2013 0910   APPEARANCEUR CLEAR 04/10/2013 0910   LABSPEC 1.021 04/10/2013 0910   PHURINE 6.0 04/10/2013 0910   GLUCOSEU NEGATIVE 04/10/2013 0910   HGBUR NEGATIVE 04/10/2013 0910   BILIRUBINUR NEGATIVE 04/10/2013 0910   KETONESUR NEGATIVE 04/10/2013 0910   PROTEINUR NEGATIVE  04/10/2013 0910   UROBILINOGEN 0.2 04/10/2013 0910   NITRITE NEGATIVE 04/10/2013 0910   LEUKOCYTESUR NEGATIVE 04/10/2013 0910   Sepsis Labs: @LABRCNTIP (procalcitonin:4,lacticidven:4) )No results found for this or any previous visit (from the past 240 hour(s)).   Radiological Exams on Admission: No results found.  EKG: Reviewed  Assessment/Plan Principal Problem:   Complex partial seizure ([place]) Active Problems:   Status epilepticus ([place])   A-fib ([facility])   #1 Status Epilepticus: Etiology unclear-patient had been compliant to his medications; no obvious intercurrent illness NPO for now Seizure precaution Neuro Checks Continue loading dose of antisz med(s) Check AED levels and adjust prn Check UA,  Consider Head CT Neurology consulted  2 Atrial Fibrillation: Stable Continue Coumadin anticoagulation per pharmacy  DVT prophylaxis: (on Coumadin) Code Status: (Full) Family Communication: Wife June Ported, by phone,Tel [phone] Disposition Plan: Home Consults called: Neurology, Dr Leonel Ramsay Admission status: (inpatient  Joelene Millin)   Benito Mccreedy MD [facility] Pager [phone]  If 7PM-7AM, please contact night-coverage [email or web address] Password [email or web address]  01/04/2017, 7:08 PM

## 2017-01-04 NOTE — ED Notes (Signed)
Bed: WV79 Expected date: 01/04/17 Expected time: 4:58 PM Means of arrival: Ambulance Comments: seizures

## 2017-01-04 NOTE — ED Notes (Signed)
Family at bedside. 

## 2017-01-05 DIAGNOSIS — G40901 Epilepsy, unspecified, not intractable, with status epilepticus: Secondary | ICD-10-CM | POA: Diagnosis present

## 2017-01-05 DIAGNOSIS — I482 Chronic atrial fibrillation: Secondary | ICD-10-CM

## 2017-01-05 DIAGNOSIS — G40201 Localization-related (focal) (partial) symptomatic epilepsy and epileptic syndromes with complex partial seizures, not intractable, with status epilepticus: Secondary | ICD-10-CM | POA: Diagnosis not present

## 2017-01-05 LAB — PROTIME-INR
INR: 2.12
Prothrombin Time: 24.1 seconds — ABNORMAL HIGH (ref 11.4–15.2)

## 2017-01-05 LAB — GLUCOSE, CAPILLARY
Glucose-Capillary: 92 mg/dL (ref 65–99)
Glucose-Capillary: 101 mg/dL — ABNORMAL HIGH (ref 65–99)

## 2017-01-05 MED ORDER — METOPROLOL SUCCINATE ER 25 MG PO TB24: 25.0000 mg | ORAL_TABLET | Freq: Every day | ORAL | Status: DC

## 2017-01-05 MED ORDER — LACOSAMIDE 50 MG PO TABS: 50.0000 mg | ORAL_TABLET | Freq: Two times a day (BID) | ORAL | Status: DC

## 2017-01-05 MED ORDER — PHENYTOIN SODIUM EXTENDED 100 MG PO CAPS
200.0000 mg | ORAL_CAPSULE | Freq: Two times a day (BID) | ORAL | Status: DC
  Filled 2017-01-05 (×2): qty 2

## 2017-01-05 MED ORDER — WARFARIN SODIUM 5 MG PO TABS: 2.5000 mg | ORAL_TABLET | Freq: Once | ORAL | Status: DC

## 2017-01-05 NOTE — Progress Notes (Signed)
ANTICOAGULATION CONSULT NOTE - [facility] for Coumadin Indication: atrial fibrillation  Allergies  Allergen Reactions  . Depakote [Divalproex Sodium] Other (See Comments)    Arthralgias     Patient Measurements: Height: 5\' 10"  (177.8 cm) Weight: 170 lb 3.1 oz (77.2 kg) IBW/kg (Calculated) : 73   Vital Signs: Temp: 98.4 F (36.9 C) (07/22 0811) Temp Source: Axillary (07/22 0811) BP: 123/70 (07/22 0811) Pulse Rate: 60 (07/22 0811)  Labs:  Recent Labs  01/04/17 1740 01/04/17 1936 01/05/17 0753  HGB 13.0  --   --   HCT 37.4*  --   --   PLT 219  --   --   LABPROT  --  24.6* 24.1*  INR  --  2.18 2.12  CREATININE 0.99  --   --     Estimated Creatinine Clearance: 62.5 mL/min (by C-G formula based on SCr of 0.99 mg/dL).   Medical History: Past Medical History:  Diagnosis Date  . Abnormality of gait 12/21/2013  . Atrial fibrillation ([place])   . Blind left eye   . Cardiac arrest ([place])   . Glaucoma   . Hypercholesteremia   . Hyperlipidemia   . Mild left ventricular systolic dysfunction   . PSA elevation   . Right frontal lobe lesion   . Seizures ([place])   . Urinary urgency     Medications:  Scheduled:  . ALPRAZolam  0.25 mg Oral BID  . brimonidine  1 drop Both Eyes Q12H  . calcium-vitamin D  1 tablet Oral Q breakfast  . finasteride  5 mg Oral QHS  . flecainide  50 mg Oral BID  . latanoprost  1 drop Both Eyes QHS  . metoprolol tartrate  25 mg Oral BID  . phenytoin  200 mg Oral BID  . rosuvastatin  20 mg Oral Daily  . timolol  1 drop Both Eyes BID  . Warfarin - Pharmacist Dosing Inpatient   Does not apply q1800   Infusions:  . sodium chloride 75 mL/hr (01/04/17 2326)  . lacosamide (VIMPAT) IV Stopped (01/05/17 0456)  . levETIRAcetam 1,500 mg (01/05/17 0526)   PRN:   Assessment: 81 yo male on chronic coumadin for afib admitted following a seizure. Pharmacy is consulted to continue coumadin dosing while inpatient.  INR therapeutic  at 2.12  CBC wnl  No bleeding documented  No new drug drug interactions noted (phenytoin is a chronic med)  Goal of Therapy:  INR 2-3   Plan:   Coumadin 2.5mg  PO x 1 tonight per home regimen  Daily PT/INR  [employer], Pharm.D., BCPS Clinical Pharmacist Pager: [phone] Clinical phone for 01/05/2017 from 8:30-4:00 is [phone]. After 4pm, please call Main Rx [phone]) for assistance. 01/05/2017 8:39 AM

## 2017-01-05 NOTE — Progress Notes (Signed)
Vagal nerve stimulator is above patient bed. No order to use per neurology, but keep above bed in case neuro needs to use the device.

## 2017-01-05 NOTE — Care Management Obs Status (Signed)
[facility] NOTIFICATION   Patient Details  Name: Sang Blount MRN: [id number] Date of Birth: 1936-04-11   Medicare Observation Status Notification Given:  Yes    Carles Collet, RN 01/05/2017, 11:56 AM

## 2017-01-05 NOTE — Progress Notes (Signed)
All belongings returned to patient. Pt and family verbalize understanding of discharge instructions. Peripheral IV d/c'd.

## 2017-01-05 NOTE — Discharge Instructions (Signed)
Epilepsy °Epilepsy is a condition in which a person has repeated seizures over time. A seizure is a sudden burst of abnormal electrical and chemical activity in the brain. Seizures can cause a change in attention, behavior, or the ability to remain awake and alert (altered mental status). °Epilepsy increases a person's risk of falls, accidents, and injury. It can also lead to complications, including: °· Depression. °· Poor memory. °· Sudden unexplained death in epilepsy (SUDEP). This complication is rare, and its cause is not known. ° °Most people with epilepsy lead normal lives. °What are the causes? °This condition may be caused by: °· A head injury. °· An injury that happens at birth. °· A high fever during childhood. °· A stroke. °· Bleeding that goes into or around the brain. °· Certain medicines and drugs. °· Having too little oxygen for a long period of time. °· Abnormal brain development. °· Certain infections, such as meningitis and encephalitis. °· Brain tumors. °· Conditions that are passed along from parent to child (are hereditary). ° °What are the signs or symptoms? °Symptoms of a seizure vary greatly from person to person. They include: °· Convulsions. °· Stiffening of the body. °· Involuntary movements of the arms or legs. °· Loss of consciousness. °· Breathing problems. °· Falling suddenly. °· Confusion. °· Head nodding. °· Eye blinking or fluttering. °· Lip smacking. °· Drooling. °· Rapid eye movements. °· Grunting. °· Loss of bladder control and bowel control. °· Staring. °· Unresponsiveness. ° °Some people have symptoms right before a seizure happens (aura) and right after a seizure happens. Symptoms of an aura include: °· Fear or anxiety. °· Nausea. °· Feeling like the room is spinning (vertigo). °· A feeling of having seen or heard something before (deja vu). °· Odd tastes or smells. °· Changes in vision, such as seeing flashing lights or spots. ° °Symptoms that follow a seizure  include: °· Confusion. °· Sleepiness. °· Headache. ° °How is this diagnosed? °This condition is diagnosed based on: °· Your symptoms. °· Your medical history. °· A physical exam. °· A neurological exam. A neurological exam is similar to a physical exam. It involves checking your strength, reflexes, coordination, and sensations. °· Tests, such as: °? An electroencephalogram (EEG). This is a painless test that creates a diagram of your brain waves. °? An MRI of the brain. °? A CT scan of the brain. °? A lumbar puncture, also called a spinal tap. °? Blood tests to check for signs of infection or abnormal blood chemistry. ° °How is this treated? °There is no cure for this condition, but treatment can help control seizures. Treatment may involve: °· Taking medicines to control seizures. These include medicines to prevent seizures and medicines to stop seizures as they occur. °· Having a device called a vagus nerve stimulator implanted in the chest. The device sends electrical impulses to the vagus nerve and to the brain to prevent seizures. This treatment may be recommended if medicines do not help. °· Brain surgery. There are several kinds of surgeries that may be done to stop seizures from happening or to reduce how often seizures happen. °· Having regular blood tests. You may need to have blood tests regularly to check that you are getting the right amount of medicine. ° °Once this condition has been diagnosed, it is important to begin treatment as soon as possible. For some people, epilepsy eventually goes away. °Follow these instructions at home: °Medicines ° °· Take over-the-counter and prescription medicines only as   told by your health care provider. °· Avoid any substances that may prevent your medicine from working properly, such as alcohol. °Activity °· Get enough rest. Lack of sleep can make seizures more likely to occur. °· Follow instructions from your health care provider about driving, swimming, and doing  any other activities that would be dangerous if you had a seizure. °Educating others °Teach friends and family what to do if you have a seizure. They should: °· Lay you on the ground to prevent a fall. °· Cushion your head and body. °· Loosen any tight clothing around your neck. °· Turn you on your side. If vomiting occurs, this helps keep your airway clear. °· Stay with you until you recover. °· Not hold you down. Holding you down will not stop the seizure. °· Not put anything in your mouth. °· Know whether or not you need emergency care. ° °General instructions °· Avoid anything that has ever triggered a seizure for you. °· Keep a seizure diary. Record what you remember about each seizure, especially anything that might have triggered the seizure. °· Keep all follow-up visits as told by your health care provider. This is important. °Contact a health care provider if: °· Your seizure pattern changes. °· You have symptoms of infection or another illness. This might increase your risk of having a seizure. °Get help right away if: °· You have a seizure that does not stop after 5 minutes. °· You have several seizures in a row without a complete recovery in between seizures. °· You have a seizure that makes it harder to breathe. °· You have a seizure that is different from previous seizures. °· You have a seizure that leaves you unable to speak or use a part of your body. °· You did not wake up immediately after a seizure. °This information is not intended to replace advice given to you by your health care provider. Make sure you discuss any questions you have with your health care provider. °Document Released: 06/03/2005 Document Revised: 12/30/2015 Document Reviewed: 12/12/2015 °Elsevier Interactive Patient Education © 2018 [employer]. ° °

## 2017-01-05 NOTE — Discharge Summary (Signed)
DISCHARGE SUMMARY  Cameron Potts  MR#: [id number]  DOB:[birth year]  Date of Admission: 01/04/2017 Date of Discharge: 01/05/2017  Attending Physician:Dnyla Antonetti T  Patient's UKG:URKYHCW, No Pcp Per  Consults:  Neurology   Disposition: D/C home   Follow-up Appts: Follow-up Information    Your epilepsy specialist at the [facility] Follow up.   Why:  Follow up with your Neurologist at the [facility] in [place] as directed.            Discharge Diagnoses: Refractory epilepsy s/p resective surgery as well as VNS Partial complex status epilepticus Chronic atrial fibrillation Chronic anticoagulation HLD  Initial presentation: 81 y.o. male with history of a complex seizure disorder and atrial fibrillation on chronic anticoagulation who presented to the ED in status epilepticus.  Earlier on the day of admit he started having prolonged seizures which he was not able to arrest w/ his  vagal nerve stimulation. EMS was activated and noted left-sided generalized tonic-clonic seizure activity for which he received midazolam 2.5 mg, after which his symptoms resolved.  In the ED he suffered a recurrent seizure and was dosed w/ 2mg  IV lorazepam, as well as an IV load of Keppra.    Hospital Course: The patient was admitted to the acute units after suffering an episode of status epilepticus to prove difficult to arrest at home.  After the dose of Versed provided by EMS and a subsequent dose of Ativan provided in the emergency room the patient seizures ceased.  He was seen in consultation by neurology.  Neurology offered to add an additional seizure medication but the patient preferred to leave this decision to his outpatient neurologist at the [facility] in [place].  The patient was otherwise medically stable and the neurology team did not feel that any further inpatient treatment was indicated.  He tolerated regular meals without any difficulty and ambulated with the nursing  staff without difficulty.  No further changes in his medications were made.  The patient was discharged home to follow-up with his neurologist as an outpatient.  Allergies as of 01/05/2017      Reactions   Depakote [divalproex Sodium] Other (See Comments)   Arthralgias      Medication List    TAKE these medications   ALPRAZolam 0.5 MG tablet Commonly known as:  XANAX Take 0.25 mg by mouth 2 (two) times daily.   bimatoprost 0.01 % Soln Commonly known as:  LUMIGAN Place 1 drop into both eyes at bedtime.   COMBIGAN 0.2-0.5 % ophthalmic solution Generic drug:  brimonidine-timolol Place 1 drop into both eyes every 12 (twelve) hours.   finasteride 5 MG tablet Commonly known as:  PROSCAR Take 5 mg by mouth daily.   flecainide 50 MG tablet Commonly known as:  TAMBOCOR Take 50 mg by mouth 2 (two) times daily.   lacosamide 200 MG Tabs tablet Commonly known as:  VIMPAT Take 200 mg by mouth 2 (two) times daily. Take additional 50mg  with each dose   lacosamide 50 MG Tabs tablet Commonly known as:  VIMPAT Take 1 tablet (50 mg total) by mouth 2 (two) times daily.   levETIRAcetam 750 MG tablet Commonly known as:  KEPPRA Take 1,500 mg by mouth 2 (two) times daily.   metoprolol succinate 50 MG 24 hr tablet Commonly known as:  TOPROL-XL Take 25 mg by mouth every evening.   phenytoin 100 MG ER capsule Commonly known as:  DILANTIN Take 200 mg by mouth 2 (two) times daily.   rosuvastatin 20  MG tablet Commonly known as:  CRESTOR Take 20 mg by mouth at bedtime.   SHINGRIX injection Generic drug:  Zoster Vac Recomb Adjuvanted Inject 50 mcg into the muscle once.   VITAMIN D (ERGOCALCIFEROL) PO Take 1 tablet by mouth daily.   warfarin 5 MG tablet Commonly known as:  COUMADIN Take 2.5 mg by mouth every evening.       Day of Discharge BP 119/71   Pulse (!) 40   Temp 98.4 F (36.9 C) (Axillary)   Resp 16   Ht 5\' 10"  (1.778 m)   Wt 77.2 kg (170 lb 3.1 oz)   SpO2 (!) 89%    BMI 24.42 kg/m   Physical Exam: General: No acute respiratory distress Lungs: Clear to auscultation bilaterally without wheezes or crackles Cardiovascular: Regular rate without murmur gallop or rub normal S1 and S2 Extremities: No significant cyanosis, clubbing, or edema bilateral lower extremities  Basic Metabolic Panel:  Recent Labs Lab 01/04/17 1740 01/04/17 2152  NA 135  --   K 4.4  --   CL 101  --   CO2 20*  --   GLUCOSE 113*  --   BUN 22*  --   CREATININE 0.99  --   CALCIUM 8.6*  --   MG  --  2.1    Coags:  Recent Labs Lab 01/04/17 1936 01/05/17 0753  INR 2.18 2.12    CBC:  Recent Labs Lab 01/04/17 1740  WBC 6.8  NEUTROABS 3.0  HGB 13.0  HCT 37.4*  MCV 92.1  PLT 219    CBG:  Recent Labs Lab 01/04/17 2349 01/05/17 0531  GLUCAP 91 92    Recent Results (from the past 240 hour(s))  MRSA PCR Screening     Status: None   Collection Time: 01/04/17  9:38 PM  Result Value Ref Range Status   MRSA by PCR NEGATIVE NEGATIVE Final    Comment:        The GeneXpert MRSA Assay (FDA approved for NASAL specimens only), is one component of a comprehensive MRSA colonization surveillance program. It is not intended to diagnose MRSA infection nor to guide or monitor treatment for MRSA infections.      Time spent in discharge (includes decision making & examination of pt): 30 minutes  01/05/2017, 11:05 AM   Cherene Altes, MD [facility] Office  [phone] Pager [phone]  On-Call/Text Page:      [email or web address]      password [facility]

## 2017-01-05 NOTE — Care Management CC44 (Signed)
Condition Code 44 Documentation Completed  Patient Details  Name: Cameron Potts MRN: [id number] Date of Birth: [birth year]   Condition Code 44 given:  Yes Patient signature on Condition Code 44 notice:  Yes Documentation of 2 MD's agreement:  Yes Code 44 added to claim:  Yes    Carles Collet, RN 01/05/2017, 11:56 AM

## 2017-01-05 NOTE — Progress Notes (Addendum)
Subjective: Patient has returned to baseline.   Exam: Vitals:   01/05/17 0300 01/05/17 0811  BP: (!) 117/57 123/70  Pulse: 61 60  Resp: 17 17  Temp: 97.8 F (36.6 C) 98.4 F (36.9 C)   Gen: In bed, NAD Resp: non-labored breathing, no acute distress Abd: soft, nt  Neuro: MS: awake, alert,  HE:KBTCY, EOMI, left hemianopia (baseline), very mild left facial weakness Motor: 4+/5 left arm drift.  Sensory: intact to LT  Pertinent Labs: theraputic INR  Impression: 81 yo M with medcally refractory epilepsy s/p resective surgery as well as VNS who presented in partial complex status epilepticus. He has tried multiple medications in the past. I offered a trial of lyrica in addition to his others with another agent weaned off in the future, but he favors discussing with his epileptologist which I feel is reasonable. With return to baseline, not further testing is indicated.   I do wonder if home diastat or some similar delivery system for benzodiazepine may be useful, but will likewise defer to his epileptologist.   Recommendations: 1) Continue home medications.  2) Neurology to sign off at this time, please call with further questions or concerns.   Roland Rack, MD [facility] [phone]  If 7pm- 7am, please page neurology on call as listed in [place].

## 2017-01-08 DIAGNOSIS — H2511 Age-related nuclear cataract, right eye: Secondary | ICD-10-CM | POA: Diagnosis not present

## 2017-01-08 DIAGNOSIS — H2512 Age-related nuclear cataract, left eye: Secondary | ICD-10-CM | POA: Diagnosis not present

## 2017-01-16 LAB — LEVETIRACETAM LEVEL

## 2017-02-18 DIAGNOSIS — G40309 Generalized idiopathic epilepsy and epileptic syndromes, not intractable, without status epilepticus: Secondary | ICD-10-CM | POA: Diagnosis not present

## 2017-02-18 DIAGNOSIS — R972 Elevated prostate specific antigen [PSA]: Secondary | ICD-10-CM | POA: Diagnosis not present

## 2017-02-18 DIAGNOSIS — Z125 Encounter for screening for malignant neoplasm of prostate: Secondary | ICD-10-CM | POA: Diagnosis not present

## 2017-02-18 DIAGNOSIS — Z5181 Encounter for therapeutic drug level monitoring: Secondary | ICD-10-CM | POA: Diagnosis not present

## 2017-02-18 DIAGNOSIS — I4892 Unspecified atrial flutter: Secondary | ICD-10-CM | POA: Diagnosis not present

## 2017-02-18 DIAGNOSIS — Z7901 Long term (current) use of anticoagulants: Secondary | ICD-10-CM | POA: Diagnosis not present

## 2017-02-18 DIAGNOSIS — R5383 Other fatigue: Secondary | ICD-10-CM | POA: Diagnosis not present

## 2017-02-18 DIAGNOSIS — I4891 Unspecified atrial fibrillation: Secondary | ICD-10-CM | POA: Diagnosis not present

## 2017-03-12 DIAGNOSIS — Z95810 Presence of automatic (implantable) cardiac defibrillator: Secondary | ICD-10-CM | POA: Diagnosis not present

## 2017-03-14 DIAGNOSIS — L853 Xerosis cutis: Secondary | ICD-10-CM | POA: Diagnosis not present

## 2017-03-14 DIAGNOSIS — L570 Actinic keratosis: Secondary | ICD-10-CM | POA: Diagnosis not present

## 2017-03-14 DIAGNOSIS — B353 Tinea pedis: Secondary | ICD-10-CM | POA: Diagnosis not present

## 2017-03-14 DIAGNOSIS — Z85828 Personal history of other malignant neoplasm of skin: Secondary | ICD-10-CM | POA: Diagnosis not present

## 2017-03-17 DIAGNOSIS — I4892 Unspecified atrial flutter: Secondary | ICD-10-CM | POA: Diagnosis not present

## 2017-03-17 DIAGNOSIS — L570 Actinic keratosis: Secondary | ICD-10-CM | POA: Diagnosis not present

## 2017-03-17 DIAGNOSIS — I4891 Unspecified atrial fibrillation: Secondary | ICD-10-CM | POA: Diagnosis not present

## 2017-03-17 DIAGNOSIS — L853 Xerosis cutis: Secondary | ICD-10-CM | POA: Diagnosis not present

## 2017-03-17 DIAGNOSIS — Z85828 Personal history of other malignant neoplasm of skin: Secondary | ICD-10-CM | POA: Diagnosis not present

## 2017-03-17 DIAGNOSIS — B353 Tinea pedis: Secondary | ICD-10-CM | POA: Diagnosis not present

## 2017-04-03 DIAGNOSIS — H401133 Primary open-angle glaucoma, bilateral, severe stage: Secondary | ICD-10-CM | POA: Diagnosis not present

## 2017-04-16 DIAGNOSIS — Z23 Encounter for immunization: Secondary | ICD-10-CM | POA: Diagnosis not present

## 2017-04-17 DIAGNOSIS — I4892 Unspecified atrial flutter: Secondary | ICD-10-CM | POA: Diagnosis not present

## 2017-04-23 DIAGNOSIS — Z961 Presence of intraocular lens: Secondary | ICD-10-CM | POA: Insufficient documentation

## 2017-04-23 DIAGNOSIS — H53141 Visual discomfort, right eye: Secondary | ICD-10-CM | POA: Diagnosis not present

## 2017-05-29 DIAGNOSIS — I4892 Unspecified atrial flutter: Secondary | ICD-10-CM | POA: Diagnosis not present

## 2017-06-11 DIAGNOSIS — L111 Transient acantholytic dermatosis [Grover]: Secondary | ICD-10-CM | POA: Diagnosis not present

## 2017-06-11 DIAGNOSIS — I4892 Unspecified atrial flutter: Secondary | ICD-10-CM | POA: Diagnosis not present

## 2017-06-11 DIAGNOSIS — L309 Dermatitis, unspecified: Secondary | ICD-10-CM | POA: Diagnosis not present

## 2017-06-20 DIAGNOSIS — J690 Pneumonitis due to inhalation of food and vomit: Secondary | ICD-10-CM | POA: Diagnosis not present

## 2017-06-20 DIAGNOSIS — I4892 Unspecified atrial flutter: Secondary | ICD-10-CM | POA: Diagnosis not present

## 2017-06-20 DIAGNOSIS — Z7901 Long term (current) use of anticoagulants: Secondary | ICD-10-CM | POA: Diagnosis not present

## 2017-06-20 DIAGNOSIS — I440 Atrioventricular block, first degree: Secondary | ICD-10-CM | POA: Diagnosis not present

## 2017-06-20 DIAGNOSIS — G40919 Epilepsy, unspecified, intractable, without status epilepticus: Secondary | ICD-10-CM | POA: Diagnosis not present

## 2017-06-20 DIAGNOSIS — Z789 Other specified health status: Secondary | ICD-10-CM | POA: Diagnosis not present

## 2017-06-20 DIAGNOSIS — Z9852 Vasectomy status: Secondary | ICD-10-CM | POA: Diagnosis not present

## 2017-06-20 DIAGNOSIS — R197 Diarrhea, unspecified: Secondary | ICD-10-CM | POA: Diagnosis not present

## 2017-06-20 DIAGNOSIS — G40319 Generalized idiopathic epilepsy and epileptic syndromes, intractable, without status epilepticus: Secondary | ICD-10-CM | POA: Diagnosis not present

## 2017-06-20 DIAGNOSIS — Z8679 Personal history of other diseases of the circulatory system: Secondary | ICD-10-CM | POA: Diagnosis not present

## 2017-06-20 DIAGNOSIS — K529 Noninfective gastroenteritis and colitis, unspecified: Secondary | ICD-10-CM | POA: Diagnosis not present

## 2017-06-20 DIAGNOSIS — N179 Acute kidney failure, unspecified: Secondary | ICD-10-CM | POA: Diagnosis not present

## 2017-06-20 DIAGNOSIS — R531 Weakness: Secondary | ICD-10-CM | POA: Diagnosis not present

## 2017-06-20 DIAGNOSIS — R569 Unspecified convulsions: Secondary | ICD-10-CM | POA: Diagnosis not present

## 2017-06-20 DIAGNOSIS — E785 Hyperlipidemia, unspecified: Secondary | ICD-10-CM | POA: Diagnosis not present

## 2017-06-20 DIAGNOSIS — Z79899 Other long term (current) drug therapy: Secondary | ICD-10-CM | POA: Diagnosis not present

## 2017-06-20 DIAGNOSIS — G40909 Epilepsy, unspecified, not intractable, without status epilepticus: Secondary | ICD-10-CM | POA: Diagnosis not present

## 2017-06-20 DIAGNOSIS — Z9689 Presence of other specified functional implants: Secondary | ICD-10-CM | POA: Diagnosis not present

## 2017-06-20 DIAGNOSIS — Z9089 Acquired absence of other organs: Secondary | ICD-10-CM | POA: Diagnosis not present

## 2017-06-20 DIAGNOSIS — K229 Disease of esophagus, unspecified: Secondary | ICD-10-CM | POA: Diagnosis not present

## 2017-06-20 DIAGNOSIS — R935 Abnormal findings on diagnostic imaging of other abdominal regions, including retroperitoneum: Secondary | ICD-10-CM | POA: Diagnosis not present

## 2017-06-20 DIAGNOSIS — G40911 Epilepsy, unspecified, intractable, with status epilepticus: Secondary | ICD-10-CM | POA: Diagnosis not present

## 2017-06-20 DIAGNOSIS — E860 Dehydration: Secondary | ICD-10-CM | POA: Diagnosis not present

## 2017-06-20 DIAGNOSIS — R410 Disorientation, unspecified: Secondary | ICD-10-CM | POA: Diagnosis not present

## 2017-06-20 DIAGNOSIS — G9341 Metabolic encephalopathy: Secondary | ICD-10-CM | POA: Diagnosis not present

## 2017-06-20 DIAGNOSIS — R1110 Vomiting, unspecified: Secondary | ICD-10-CM | POA: Diagnosis not present

## 2017-06-20 DIAGNOSIS — Z87891 Personal history of nicotine dependence: Secondary | ICD-10-CM | POA: Diagnosis not present

## 2017-06-20 DIAGNOSIS — K56600 Partial intestinal obstruction, unspecified as to cause: Secondary | ICD-10-CM | POA: Diagnosis not present

## 2017-06-20 DIAGNOSIS — R29818 Other symptoms and signs involving the nervous system: Secondary | ICD-10-CM | POA: Diagnosis not present

## 2017-06-20 DIAGNOSIS — R9431 Abnormal electrocardiogram [ECG] [EKG]: Secondary | ICD-10-CM | POA: Diagnosis not present

## 2017-06-21 DIAGNOSIS — A084 Viral intestinal infection, unspecified: Secondary | ICD-10-CM | POA: Diagnosis present

## 2017-06-21 DIAGNOSIS — G40319 Generalized idiopathic epilepsy and epileptic syndromes, intractable, without status epilepticus: Secondary | ICD-10-CM | POA: Diagnosis present

## 2017-06-21 DIAGNOSIS — F05 Delirium due to known physiological condition: Secondary | ICD-10-CM | POA: Diagnosis present

## 2017-06-21 DIAGNOSIS — I480 Paroxysmal atrial fibrillation: Secondary | ICD-10-CM | POA: Diagnosis present

## 2017-06-21 DIAGNOSIS — Z969 Presence of functional implant, unspecified: Secondary | ICD-10-CM | POA: Diagnosis not present

## 2017-06-21 DIAGNOSIS — R5381 Other malaise: Secondary | ICD-10-CM | POA: Diagnosis not present

## 2017-06-21 DIAGNOSIS — N179 Acute kidney failure, unspecified: Secondary | ICD-10-CM | POA: Diagnosis present

## 2017-06-21 DIAGNOSIS — T45515A Adverse effect of anticoagulants, initial encounter: Secondary | ICD-10-CM | POA: Diagnosis not present

## 2017-06-21 DIAGNOSIS — R1312 Dysphagia, oropharyngeal phase: Secondary | ICD-10-CM | POA: Diagnosis not present

## 2017-06-21 DIAGNOSIS — R1313 Dysphagia, pharyngeal phase: Secondary | ICD-10-CM | POA: Diagnosis not present

## 2017-06-21 DIAGNOSIS — Z9889 Other specified postprocedural states: Secondary | ICD-10-CM | POA: Diagnosis not present

## 2017-06-21 DIAGNOSIS — Z8679 Personal history of other diseases of the circulatory system: Secondary | ICD-10-CM | POA: Diagnosis not present

## 2017-06-21 DIAGNOSIS — I4892 Unspecified atrial flutter: Secondary | ICD-10-CM | POA: Diagnosis present

## 2017-06-21 DIAGNOSIS — I10 Essential (primary) hypertension: Secondary | ICD-10-CM | POA: Diagnosis not present

## 2017-06-21 DIAGNOSIS — M8580 Other specified disorders of bone density and structure, unspecified site: Secondary | ICD-10-CM | POA: Diagnosis present

## 2017-06-21 DIAGNOSIS — R1310 Dysphagia, unspecified: Secondary | ICD-10-CM | POA: Diagnosis not present

## 2017-06-21 DIAGNOSIS — R918 Other nonspecific abnormal finding of lung field: Secondary | ICD-10-CM | POA: Diagnosis not present

## 2017-06-21 DIAGNOSIS — E785 Hyperlipidemia, unspecified: Secondary | ICD-10-CM | POA: Diagnosis present

## 2017-06-21 DIAGNOSIS — E860 Dehydration: Secondary | ICD-10-CM | POA: Diagnosis present

## 2017-06-21 DIAGNOSIS — E559 Vitamin D deficiency, unspecified: Secondary | ICD-10-CM | POA: Diagnosis present

## 2017-06-21 DIAGNOSIS — Z95818 Presence of other cardiac implants and grafts: Secondary | ICD-10-CM | POA: Diagnosis not present

## 2017-06-21 DIAGNOSIS — G40919 Epilepsy, unspecified, intractable, without status epilepticus: Secondary | ICD-10-CM | POA: Diagnosis not present

## 2017-06-21 DIAGNOSIS — K209 Esophagitis, unspecified: Secondary | ICD-10-CM | POA: Diagnosis present

## 2017-06-21 DIAGNOSIS — R410 Disorientation, unspecified: Secondary | ICD-10-CM | POA: Diagnosis not present

## 2017-06-21 DIAGNOSIS — H402220 Chronic angle-closure glaucoma, left eye, stage unspecified: Secondary | ICD-10-CM | POA: Diagnosis present

## 2017-06-21 DIAGNOSIS — H47233 Glaucomatous optic atrophy, bilateral: Secondary | ICD-10-CM | POA: Diagnosis present

## 2017-06-21 DIAGNOSIS — J690 Pneumonitis due to inhalation of food and vomit: Secondary | ICD-10-CM | POA: Diagnosis not present

## 2017-06-21 DIAGNOSIS — J90 Pleural effusion, not elsewhere classified: Secondary | ICD-10-CM | POA: Diagnosis not present

## 2017-06-21 DIAGNOSIS — K9089 Other intestinal malabsorption: Secondary | ICD-10-CM | POA: Diagnosis present

## 2017-06-21 DIAGNOSIS — R4702 Dysphasia: Secondary | ICD-10-CM | POA: Diagnosis not present

## 2017-06-21 DIAGNOSIS — R1311 Dysphagia, oral phase: Secondary | ICD-10-CM | POA: Diagnosis not present

## 2017-06-21 DIAGNOSIS — K56600 Partial intestinal obstruction, unspecified as to cause: Secondary | ICD-10-CM | POA: Diagnosis not present

## 2017-06-21 DIAGNOSIS — Z950 Presence of cardiac pacemaker: Secondary | ICD-10-CM | POA: Diagnosis not present

## 2017-06-21 DIAGNOSIS — K56609 Unspecified intestinal obstruction, unspecified as to partial versus complete obstruction: Secondary | ICD-10-CM | POA: Diagnosis not present

## 2017-06-21 DIAGNOSIS — G40419 Other generalized epilepsy and epileptic syndromes, intractable, without status epilepticus: Secondary | ICD-10-CM | POA: Diagnosis not present

## 2017-06-21 DIAGNOSIS — F4322 Adjustment disorder with anxiety: Secondary | ICD-10-CM | POA: Diagnosis present

## 2017-06-21 DIAGNOSIS — G9341 Metabolic encephalopathy: Secondary | ICD-10-CM | POA: Diagnosis present

## 2017-06-21 DIAGNOSIS — R791 Abnormal coagulation profile: Secondary | ICD-10-CM | POA: Diagnosis not present

## 2017-06-21 DIAGNOSIS — K668 Other specified disorders of peritoneum: Secondary | ICD-10-CM | POA: Diagnosis present

## 2017-06-30 DIAGNOSIS — R4182 Altered mental status, unspecified: Secondary | ICD-10-CM | POA: Diagnosis not present

## 2017-06-30 DIAGNOSIS — K56699 Other intestinal obstruction unspecified as to partial versus complete obstruction: Secondary | ICD-10-CM | POA: Diagnosis present

## 2017-06-30 DIAGNOSIS — G40119 Localization-related (focal) (partial) symptomatic epilepsy and epileptic syndromes with simple partial seizures, intractable, without status epilepticus: Secondary | ICD-10-CM | POA: Diagnosis present

## 2017-06-30 DIAGNOSIS — J9600 Acute respiratory failure, unspecified whether with hypoxia or hypercapnia: Secondary | ICD-10-CM | POA: Diagnosis not present

## 2017-06-30 DIAGNOSIS — I4892 Unspecified atrial flutter: Secondary | ICD-10-CM | POA: Diagnosis present

## 2017-06-30 DIAGNOSIS — G92 Toxic encephalopathy: Secondary | ICD-10-CM | POA: Diagnosis present

## 2017-06-30 DIAGNOSIS — Z79899 Other long term (current) drug therapy: Secondary | ICD-10-CM | POA: Diagnosis not present

## 2017-06-30 DIAGNOSIS — S06890A Other specified intracranial injury without loss of consciousness, initial encounter: Secondary | ICD-10-CM | POA: Diagnosis not present

## 2017-06-30 DIAGNOSIS — R197 Diarrhea, unspecified: Secondary | ICD-10-CM | POA: Diagnosis present

## 2017-06-30 DIAGNOSIS — Z95810 Presence of automatic (implantable) cardiac defibrillator: Secondary | ICD-10-CM | POA: Diagnosis not present

## 2017-06-30 DIAGNOSIS — S06890D Other specified intracranial injury without loss of consciousness, subsequent encounter: Secondary | ICD-10-CM | POA: Diagnosis not present

## 2017-06-30 DIAGNOSIS — Z9689 Presence of other specified functional implants: Secondary | ICD-10-CM | POA: Diagnosis present

## 2017-06-30 DIAGNOSIS — R0600 Dyspnea, unspecified: Secondary | ICD-10-CM | POA: Diagnosis not present

## 2017-06-30 DIAGNOSIS — R410 Disorientation, unspecified: Secondary | ICD-10-CM | POA: Diagnosis not present

## 2017-06-30 DIAGNOSIS — E559 Vitamin D deficiency, unspecified: Secondary | ICD-10-CM | POA: Diagnosis present

## 2017-06-30 DIAGNOSIS — F419 Anxiety disorder, unspecified: Secondary | ICD-10-CM | POA: Diagnosis present

## 2017-06-30 DIAGNOSIS — Z87891 Personal history of nicotine dependence: Secondary | ICD-10-CM | POA: Diagnosis not present

## 2017-06-30 DIAGNOSIS — Z8679 Personal history of other diseases of the circulatory system: Secondary | ICD-10-CM | POA: Diagnosis not present

## 2017-06-30 DIAGNOSIS — N400 Enlarged prostate without lower urinary tract symptoms: Secondary | ICD-10-CM | POA: Diagnosis present

## 2017-06-30 DIAGNOSIS — R1313 Dysphagia, pharyngeal phase: Secondary | ICD-10-CM | POA: Diagnosis not present

## 2017-06-30 DIAGNOSIS — R159 Full incontinence of feces: Secondary | ICD-10-CM | POA: Diagnosis present

## 2017-06-30 DIAGNOSIS — Z7901 Long term (current) use of anticoagulants: Secondary | ICD-10-CM | POA: Diagnosis not present

## 2017-06-30 DIAGNOSIS — R1311 Dysphagia, oral phase: Secondary | ICD-10-CM | POA: Diagnosis not present

## 2017-06-30 DIAGNOSIS — J984 Other disorders of lung: Secondary | ICD-10-CM | POA: Diagnosis not present

## 2017-06-30 DIAGNOSIS — Z8546 Personal history of malignant neoplasm of prostate: Secondary | ICD-10-CM | POA: Diagnosis not present

## 2017-06-30 DIAGNOSIS — G40209 Localization-related (focal) (partial) symptomatic epilepsy and epileptic syndromes with complex partial seizures, not intractable, without status epilepticus: Secondary | ICD-10-CM | POA: Diagnosis not present

## 2017-06-30 DIAGNOSIS — R2681 Unsteadiness on feet: Secondary | ICD-10-CM | POA: Diagnosis not present

## 2017-06-30 DIAGNOSIS — J189 Pneumonia, unspecified organism: Secondary | ICD-10-CM | POA: Diagnosis not present

## 2017-06-30 DIAGNOSIS — R0602 Shortness of breath: Secondary | ICD-10-CM | POA: Diagnosis not present

## 2017-06-30 DIAGNOSIS — G40919 Epilepsy, unspecified, intractable, without status epilepticus: Secondary | ICD-10-CM | POA: Diagnosis not present

## 2017-06-30 DIAGNOSIS — G40419 Other generalized epilepsy and epileptic syndromes, intractable, without status epilepticus: Secondary | ICD-10-CM | POA: Diagnosis not present

## 2017-06-30 DIAGNOSIS — E785 Hyperlipidemia, unspecified: Secondary | ICD-10-CM | POA: Diagnosis not present

## 2017-06-30 DIAGNOSIS — Z9981 Dependence on supplemental oxygen: Secondary | ICD-10-CM | POA: Diagnosis not present

## 2017-06-30 DIAGNOSIS — Z79891 Long term (current) use of opiate analgesic: Secondary | ICD-10-CM | POA: Diagnosis not present

## 2017-06-30 DIAGNOSIS — K56600 Partial intestinal obstruction, unspecified as to cause: Secondary | ICD-10-CM | POA: Diagnosis not present

## 2017-06-30 DIAGNOSIS — J690 Pneumonitis due to inhalation of food and vomit: Secondary | ICD-10-CM | POA: Diagnosis not present

## 2017-06-30 DIAGNOSIS — I4891 Unspecified atrial fibrillation: Secondary | ICD-10-CM | POA: Diagnosis not present

## 2017-06-30 DIAGNOSIS — R21 Rash and other nonspecific skin eruption: Secondary | ICD-10-CM | POA: Diagnosis present

## 2017-06-30 DIAGNOSIS — I10 Essential (primary) hypertension: Secondary | ICD-10-CM | POA: Diagnosis present

## 2017-06-30 DIAGNOSIS — J9601 Acute respiratory failure with hypoxia: Secondary | ICD-10-CM | POA: Diagnosis not present

## 2017-06-30 DIAGNOSIS — R1312 Dysphagia, oropharyngeal phase: Secondary | ICD-10-CM | POA: Diagnosis present

## 2017-06-30 DIAGNOSIS — Z7689 Persons encountering health services in other specified circumstances: Secondary | ICD-10-CM | POA: Diagnosis not present

## 2017-07-01 DIAGNOSIS — G9341 Metabolic encephalopathy: Secondary | ICD-10-CM | POA: Diagnosis not present

## 2017-07-01 DIAGNOSIS — I951 Orthostatic hypotension: Secondary | ICD-10-CM | POA: Diagnosis not present

## 2017-07-01 DIAGNOSIS — Z789 Other specified health status: Secondary | ICD-10-CM | POA: Diagnosis not present

## 2017-07-01 DIAGNOSIS — N4231 Prostatic intraepithelial neoplasia: Secondary | ICD-10-CM | POA: Diagnosis present

## 2017-07-01 DIAGNOSIS — Z978 Presence of other specified devices: Secondary | ICD-10-CM | POA: Diagnosis not present

## 2017-07-01 DIAGNOSIS — E785 Hyperlipidemia, unspecified: Secondary | ICD-10-CM | POA: Diagnosis not present

## 2017-07-01 DIAGNOSIS — R4182 Altered mental status, unspecified: Secondary | ICD-10-CM | POA: Diagnosis not present

## 2017-07-01 DIAGNOSIS — R402124 Coma scale, eyes open, to pain, 24 hours or more after hospital admission: Secondary | ICD-10-CM | POA: Diagnosis not present

## 2017-07-01 DIAGNOSIS — Z79891 Long term (current) use of opiate analgesic: Secondary | ICD-10-CM | POA: Diagnosis not present

## 2017-07-01 DIAGNOSIS — I4892 Unspecified atrial flutter: Secondary | ICD-10-CM | POA: Diagnosis present

## 2017-07-01 DIAGNOSIS — R0602 Shortness of breath: Secondary | ICD-10-CM | POA: Diagnosis not present

## 2017-07-01 DIAGNOSIS — Z66 Do not resuscitate: Secondary | ICD-10-CM | POA: Diagnosis not present

## 2017-07-01 DIAGNOSIS — R0600 Dyspnea, unspecified: Secondary | ICD-10-CM | POA: Diagnosis not present

## 2017-07-01 DIAGNOSIS — E559 Vitamin D deficiency, unspecified: Secondary | ICD-10-CM | POA: Diagnosis present

## 2017-07-01 DIAGNOSIS — J9811 Atelectasis: Secondary | ICD-10-CM | POA: Diagnosis not present

## 2017-07-01 DIAGNOSIS — H2513 Age-related nuclear cataract, bilateral: Secondary | ICD-10-CM | POA: Diagnosis present

## 2017-07-01 DIAGNOSIS — K6389 Other specified diseases of intestine: Secondary | ICD-10-CM | POA: Diagnosis not present

## 2017-07-01 DIAGNOSIS — R569 Unspecified convulsions: Secondary | ICD-10-CM | POA: Diagnosis not present

## 2017-07-01 DIAGNOSIS — E43 Unspecified severe protein-calorie malnutrition: Secondary | ICD-10-CM | POA: Diagnosis present

## 2017-07-01 DIAGNOSIS — I10 Essential (primary) hypertension: Secondary | ICD-10-CM | POA: Diagnosis present

## 2017-07-01 DIAGNOSIS — G20 Parkinson's disease: Secondary | ICD-10-CM | POA: Diagnosis present

## 2017-07-01 DIAGNOSIS — F918 Other conduct disorders: Secondary | ICD-10-CM | POA: Diagnosis not present

## 2017-07-01 DIAGNOSIS — M8580 Other specified disorders of bone density and structure, unspecified site: Secondary | ICD-10-CM | POA: Diagnosis present

## 2017-07-01 DIAGNOSIS — J9601 Acute respiratory failure with hypoxia: Secondary | ICD-10-CM | POA: Diagnosis not present

## 2017-07-01 DIAGNOSIS — Z8679 Personal history of other diseases of the circulatory system: Secondary | ICD-10-CM | POA: Diagnosis not present

## 2017-07-01 DIAGNOSIS — H47233 Glaucomatous optic atrophy, bilateral: Secondary | ICD-10-CM | POA: Diagnosis present

## 2017-07-01 DIAGNOSIS — R402224 Coma scale, best verbal response, incomprehensible words, 24 hours or more after hospital admission: Secondary | ICD-10-CM | POA: Diagnosis not present

## 2017-07-01 DIAGNOSIS — Z950 Presence of cardiac pacemaker: Secondary | ICD-10-CM | POA: Diagnosis not present

## 2017-07-01 DIAGNOSIS — I4891 Unspecified atrial fibrillation: Secondary | ICD-10-CM | POA: Diagnosis not present

## 2017-07-01 DIAGNOSIS — J189 Pneumonia, unspecified organism: Secondary | ICD-10-CM | POA: Diagnosis not present

## 2017-07-01 DIAGNOSIS — J984 Other disorders of lung: Secondary | ICD-10-CM | POA: Diagnosis not present

## 2017-07-01 DIAGNOSIS — R402344 Coma scale, best motor response, flexion withdrawal, 24 hours or more after hospital admission: Secondary | ICD-10-CM | POA: Diagnosis not present

## 2017-07-01 DIAGNOSIS — G40101 Localization-related (focal) (partial) symptomatic epilepsy and epileptic syndromes with simple partial seizures, not intractable, with status epilepticus: Secondary | ICD-10-CM | POA: Diagnosis not present

## 2017-07-01 DIAGNOSIS — G40209 Localization-related (focal) (partial) symptomatic epilepsy and epileptic syndromes with complex partial seizures, not intractable, without status epilepticus: Secondary | ICD-10-CM | POA: Diagnosis not present

## 2017-07-01 DIAGNOSIS — I82403 Acute embolism and thrombosis of unspecified deep veins of lower extremity, bilateral: Secondary | ICD-10-CM | POA: Diagnosis not present

## 2017-07-01 DIAGNOSIS — Z79899 Other long term (current) drug therapy: Secondary | ICD-10-CM | POA: Diagnosis not present

## 2017-07-01 DIAGNOSIS — L89309 Pressure ulcer of unspecified buttock, unspecified stage: Secondary | ICD-10-CM | POA: Diagnosis present

## 2017-07-01 DIAGNOSIS — L89159 Pressure ulcer of sacral region, unspecified stage: Secondary | ICD-10-CM | POA: Diagnosis present

## 2017-07-01 DIAGNOSIS — R918 Other nonspecific abnormal finding of lung field: Secondary | ICD-10-CM | POA: Diagnosis not present

## 2017-07-01 DIAGNOSIS — Z87891 Personal history of nicotine dependence: Secondary | ICD-10-CM | POA: Diagnosis not present

## 2017-07-01 DIAGNOSIS — J690 Pneumonitis due to inhalation of food and vomit: Secondary | ICD-10-CM | POA: Diagnosis not present

## 2017-07-01 DIAGNOSIS — I700 Atherosclerosis of aorta: Secondary | ICD-10-CM | POA: Diagnosis not present

## 2017-07-01 DIAGNOSIS — E871 Hypo-osmolality and hyponatremia: Secondary | ICD-10-CM | POA: Diagnosis not present

## 2017-07-01 DIAGNOSIS — K56609 Unspecified intestinal obstruction, unspecified as to partial versus complete obstruction: Secondary | ICD-10-CM | POA: Diagnosis present

## 2017-07-01 DIAGNOSIS — G40219 Localization-related (focal) (partial) symptomatic epilepsy and epileptic syndromes with complex partial seizures, intractable, without status epilepticus: Secondary | ICD-10-CM | POA: Diagnosis not present

## 2017-07-01 DIAGNOSIS — Z95810 Presence of automatic (implantable) cardiac defibrillator: Secondary | ICD-10-CM | POA: Diagnosis not present

## 2017-07-01 DIAGNOSIS — Z931 Gastrostomy status: Secondary | ICD-10-CM | POA: Diagnosis not present

## 2017-07-01 DIAGNOSIS — G40419 Other generalized epilepsy and epileptic syndromes, intractable, without status epilepticus: Secondary | ICD-10-CM | POA: Diagnosis not present

## 2017-07-01 DIAGNOSIS — I480 Paroxysmal atrial fibrillation: Secondary | ICD-10-CM | POA: Diagnosis present

## 2017-07-12 DIAGNOSIS — E871 Hypo-osmolality and hyponatremia: Secondary | ICD-10-CM | POA: Insufficient documentation

## 2017-07-26 DIAGNOSIS — G40909 Epilepsy, unspecified, not intractable, without status epilepticus: Secondary | ICD-10-CM | POA: Diagnosis not present

## 2017-07-26 DIAGNOSIS — S51812D Laceration without foreign body of left forearm, subsequent encounter: Secondary | ICD-10-CM | POA: Diagnosis not present

## 2017-07-26 DIAGNOSIS — L89152 Pressure ulcer of sacral region, stage 2: Secondary | ICD-10-CM | POA: Diagnosis not present

## 2017-07-26 DIAGNOSIS — I480 Paroxysmal atrial fibrillation: Secondary | ICD-10-CM | POA: Diagnosis not present

## 2017-07-26 DIAGNOSIS — G903 Multi-system degeneration of the autonomic nervous system: Secondary | ICD-10-CM | POA: Diagnosis not present

## 2017-07-26 DIAGNOSIS — R1310 Dysphagia, unspecified: Secondary | ICD-10-CM | POA: Diagnosis not present

## 2017-07-26 DIAGNOSIS — J690 Pneumonitis due to inhalation of food and vomit: Secondary | ICD-10-CM | POA: Diagnosis not present

## 2017-07-26 DIAGNOSIS — Z7901 Long term (current) use of anticoagulants: Secondary | ICD-10-CM | POA: Diagnosis not present

## 2017-07-26 DIAGNOSIS — I4892 Unspecified atrial flutter: Secondary | ICD-10-CM | POA: Diagnosis not present

## 2017-07-26 DIAGNOSIS — N4231 Prostatic intraepithelial neoplasia: Secondary | ICD-10-CM | POA: Diagnosis not present

## 2017-07-28 DIAGNOSIS — I480 Paroxysmal atrial fibrillation: Secondary | ICD-10-CM | POA: Diagnosis not present

## 2017-07-28 DIAGNOSIS — N4231 Prostatic intraepithelial neoplasia: Secondary | ICD-10-CM | POA: Diagnosis not present

## 2017-07-28 DIAGNOSIS — G903 Multi-system degeneration of the autonomic nervous system: Secondary | ICD-10-CM | POA: Diagnosis not present

## 2017-07-28 DIAGNOSIS — I4892 Unspecified atrial flutter: Secondary | ICD-10-CM | POA: Diagnosis not present

## 2017-07-28 DIAGNOSIS — R1310 Dysphagia, unspecified: Secondary | ICD-10-CM | POA: Diagnosis not present

## 2017-07-28 DIAGNOSIS — G40909 Epilepsy, unspecified, not intractable, without status epilepticus: Secondary | ICD-10-CM | POA: Diagnosis not present

## 2017-07-28 DIAGNOSIS — Z5181 Encounter for therapeutic drug level monitoring: Secondary | ICD-10-CM | POA: Diagnosis not present

## 2017-07-29 DIAGNOSIS — R1310 Dysphagia, unspecified: Secondary | ICD-10-CM | POA: Diagnosis not present

## 2017-07-29 DIAGNOSIS — I480 Paroxysmal atrial fibrillation: Secondary | ICD-10-CM | POA: Diagnosis not present

## 2017-07-29 DIAGNOSIS — I4892 Unspecified atrial flutter: Secondary | ICD-10-CM | POA: Diagnosis not present

## 2017-07-29 DIAGNOSIS — N4231 Prostatic intraepithelial neoplasia: Secondary | ICD-10-CM | POA: Diagnosis not present

## 2017-07-29 DIAGNOSIS — G40909 Epilepsy, unspecified, not intractable, without status epilepticus: Secondary | ICD-10-CM | POA: Diagnosis not present

## 2017-07-29 DIAGNOSIS — G903 Multi-system degeneration of the autonomic nervous system: Secondary | ICD-10-CM | POA: Diagnosis not present

## 2017-07-30 DIAGNOSIS — R569 Unspecified convulsions: Secondary | ICD-10-CM | POA: Diagnosis not present

## 2017-07-31 DIAGNOSIS — I480 Paroxysmal atrial fibrillation: Secondary | ICD-10-CM | POA: Diagnosis not present

## 2017-07-31 DIAGNOSIS — R1310 Dysphagia, unspecified: Secondary | ICD-10-CM | POA: Diagnosis not present

## 2017-07-31 DIAGNOSIS — I4892 Unspecified atrial flutter: Secondary | ICD-10-CM | POA: Diagnosis not present

## 2017-07-31 DIAGNOSIS — G903 Multi-system degeneration of the autonomic nervous system: Secondary | ICD-10-CM | POA: Diagnosis not present

## 2017-07-31 DIAGNOSIS — N4231 Prostatic intraepithelial neoplasia: Secondary | ICD-10-CM | POA: Diagnosis not present

## 2017-07-31 DIAGNOSIS — G40909 Epilepsy, unspecified, not intractable, without status epilepticus: Secondary | ICD-10-CM | POA: Diagnosis not present

## 2017-08-01 DIAGNOSIS — R569 Unspecified convulsions: Secondary | ICD-10-CM | POA: Diagnosis not present

## 2017-08-01 DIAGNOSIS — N4231 Prostatic intraepithelial neoplasia: Secondary | ICD-10-CM | POA: Diagnosis not present

## 2017-08-01 DIAGNOSIS — I4892 Unspecified atrial flutter: Secondary | ICD-10-CM | POA: Diagnosis not present

## 2017-08-01 DIAGNOSIS — G40909 Epilepsy, unspecified, not intractable, without status epilepticus: Secondary | ICD-10-CM | POA: Diagnosis not present

## 2017-08-01 DIAGNOSIS — R1310 Dysphagia, unspecified: Secondary | ICD-10-CM | POA: Diagnosis not present

## 2017-08-01 DIAGNOSIS — I480 Paroxysmal atrial fibrillation: Secondary | ICD-10-CM | POA: Diagnosis not present

## 2017-08-01 DIAGNOSIS — G903 Multi-system degeneration of the autonomic nervous system: Secondary | ICD-10-CM | POA: Diagnosis not present

## 2017-08-04 DIAGNOSIS — I4892 Unspecified atrial flutter: Secondary | ICD-10-CM | POA: Diagnosis not present

## 2017-08-04 DIAGNOSIS — N4231 Prostatic intraepithelial neoplasia: Secondary | ICD-10-CM | POA: Diagnosis not present

## 2017-08-04 DIAGNOSIS — I480 Paroxysmal atrial fibrillation: Secondary | ICD-10-CM | POA: Diagnosis not present

## 2017-08-04 DIAGNOSIS — R1310 Dysphagia, unspecified: Secondary | ICD-10-CM | POA: Diagnosis not present

## 2017-08-04 DIAGNOSIS — G40909 Epilepsy, unspecified, not intractable, without status epilepticus: Secondary | ICD-10-CM | POA: Diagnosis not present

## 2017-08-04 DIAGNOSIS — R569 Unspecified convulsions: Secondary | ICD-10-CM | POA: Diagnosis not present

## 2017-08-04 DIAGNOSIS — G903 Multi-system degeneration of the autonomic nervous system: Secondary | ICD-10-CM | POA: Diagnosis not present

## 2017-08-06 DIAGNOSIS — N4231 Prostatic intraepithelial neoplasia: Secondary | ICD-10-CM | POA: Diagnosis not present

## 2017-08-06 DIAGNOSIS — R1310 Dysphagia, unspecified: Secondary | ICD-10-CM | POA: Diagnosis not present

## 2017-08-06 DIAGNOSIS — I480 Paroxysmal atrial fibrillation: Secondary | ICD-10-CM | POA: Diagnosis not present

## 2017-08-06 DIAGNOSIS — I4892 Unspecified atrial flutter: Secondary | ICD-10-CM | POA: Diagnosis not present

## 2017-08-06 DIAGNOSIS — G903 Multi-system degeneration of the autonomic nervous system: Secondary | ICD-10-CM | POA: Diagnosis not present

## 2017-08-06 DIAGNOSIS — G40909 Epilepsy, unspecified, not intractable, without status epilepticus: Secondary | ICD-10-CM | POA: Diagnosis not present

## 2017-08-08 DIAGNOSIS — G903 Multi-system degeneration of the autonomic nervous system: Secondary | ICD-10-CM | POA: Diagnosis not present

## 2017-08-08 DIAGNOSIS — G40909 Epilepsy, unspecified, not intractable, without status epilepticus: Secondary | ICD-10-CM | POA: Diagnosis not present

## 2017-08-08 DIAGNOSIS — N4231 Prostatic intraepithelial neoplasia: Secondary | ICD-10-CM | POA: Diagnosis not present

## 2017-08-08 DIAGNOSIS — I4892 Unspecified atrial flutter: Secondary | ICD-10-CM | POA: Diagnosis not present

## 2017-08-08 DIAGNOSIS — I480 Paroxysmal atrial fibrillation: Secondary | ICD-10-CM | POA: Diagnosis not present

## 2017-08-08 DIAGNOSIS — R1310 Dysphagia, unspecified: Secondary | ICD-10-CM | POA: Diagnosis not present

## 2017-08-11 DIAGNOSIS — I4892 Unspecified atrial flutter: Secondary | ICD-10-CM | POA: Diagnosis not present

## 2017-08-11 DIAGNOSIS — I480 Paroxysmal atrial fibrillation: Secondary | ICD-10-CM | POA: Diagnosis not present

## 2017-08-11 DIAGNOSIS — N4231 Prostatic intraepithelial neoplasia: Secondary | ICD-10-CM | POA: Diagnosis not present

## 2017-08-11 DIAGNOSIS — G40909 Epilepsy, unspecified, not intractable, without status epilepticus: Secondary | ICD-10-CM | POA: Diagnosis not present

## 2017-08-11 DIAGNOSIS — G903 Multi-system degeneration of the autonomic nervous system: Secondary | ICD-10-CM | POA: Diagnosis not present

## 2017-08-11 DIAGNOSIS — R1310 Dysphagia, unspecified: Secondary | ICD-10-CM | POA: Diagnosis not present

## 2017-08-12 DIAGNOSIS — F419 Anxiety disorder, unspecified: Secondary | ICD-10-CM | POA: Diagnosis not present

## 2017-08-12 DIAGNOSIS — R251 Tremor, unspecified: Secondary | ICD-10-CM | POA: Diagnosis not present

## 2017-08-12 DIAGNOSIS — I4891 Unspecified atrial fibrillation: Secondary | ICD-10-CM | POA: Diagnosis not present

## 2017-08-12 DIAGNOSIS — L89301 Pressure ulcer of unspecified buttock, stage 1: Secondary | ICD-10-CM | POA: Diagnosis not present

## 2017-08-12 DIAGNOSIS — I951 Orthostatic hypotension: Secondary | ICD-10-CM | POA: Diagnosis not present

## 2017-08-12 DIAGNOSIS — R569 Unspecified convulsions: Secondary | ICD-10-CM | POA: Diagnosis not present

## 2017-08-12 DIAGNOSIS — F0390 Unspecified dementia without behavioral disturbance: Secondary | ICD-10-CM | POA: Diagnosis not present

## 2017-08-12 DIAGNOSIS — G903 Multi-system degeneration of the autonomic nervous system: Secondary | ICD-10-CM | POA: Diagnosis not present

## 2017-08-12 DIAGNOSIS — E785 Hyperlipidemia, unspecified: Secondary | ICD-10-CM | POA: Diagnosis not present

## 2017-08-12 DIAGNOSIS — R1310 Dysphagia, unspecified: Secondary | ICD-10-CM | POA: Diagnosis not present

## 2017-08-12 DIAGNOSIS — Z0189 Encounter for other specified special examinations: Secondary | ICD-10-CM | POA: Diagnosis not present

## 2017-08-13 DIAGNOSIS — G903 Multi-system degeneration of the autonomic nervous system: Secondary | ICD-10-CM | POA: Diagnosis not present

## 2017-08-13 DIAGNOSIS — I4891 Unspecified atrial fibrillation: Secondary | ICD-10-CM | POA: Diagnosis not present

## 2017-08-13 DIAGNOSIS — I951 Orthostatic hypotension: Secondary | ICD-10-CM | POA: Diagnosis not present

## 2017-08-13 DIAGNOSIS — F419 Anxiety disorder, unspecified: Secondary | ICD-10-CM | POA: Diagnosis not present

## 2017-08-13 DIAGNOSIS — R251 Tremor, unspecified: Secondary | ICD-10-CM | POA: Diagnosis not present

## 2017-08-13 DIAGNOSIS — R569 Unspecified convulsions: Secondary | ICD-10-CM | POA: Diagnosis not present

## 2017-08-14 DIAGNOSIS — R569 Unspecified convulsions: Secondary | ICD-10-CM | POA: Diagnosis not present

## 2017-08-14 DIAGNOSIS — F419 Anxiety disorder, unspecified: Secondary | ICD-10-CM | POA: Diagnosis not present

## 2017-08-14 DIAGNOSIS — R251 Tremor, unspecified: Secondary | ICD-10-CM | POA: Diagnosis not present

## 2017-08-14 DIAGNOSIS — I951 Orthostatic hypotension: Secondary | ICD-10-CM | POA: Diagnosis not present

## 2017-08-14 DIAGNOSIS — I4891 Unspecified atrial fibrillation: Secondary | ICD-10-CM | POA: Diagnosis not present

## 2017-08-14 DIAGNOSIS — G903 Multi-system degeneration of the autonomic nervous system: Secondary | ICD-10-CM | POA: Diagnosis not present

## 2017-08-15 DIAGNOSIS — I4891 Unspecified atrial fibrillation: Secondary | ICD-10-CM | POA: Diagnosis not present

## 2017-08-15 DIAGNOSIS — R569 Unspecified convulsions: Secondary | ICD-10-CM | POA: Diagnosis not present

## 2017-08-15 DIAGNOSIS — L89301 Pressure ulcer of unspecified buttock, stage 1: Secondary | ICD-10-CM | POA: Diagnosis not present

## 2017-08-15 DIAGNOSIS — G903 Multi-system degeneration of the autonomic nervous system: Secondary | ICD-10-CM | POA: Diagnosis not present

## 2017-08-15 DIAGNOSIS — F0390 Unspecified dementia without behavioral disturbance: Secondary | ICD-10-CM | POA: Diagnosis not present

## 2017-08-15 DIAGNOSIS — F419 Anxiety disorder, unspecified: Secondary | ICD-10-CM | POA: Diagnosis not present

## 2017-08-15 DIAGNOSIS — I951 Orthostatic hypotension: Secondary | ICD-10-CM | POA: Diagnosis not present

## 2017-08-15 DIAGNOSIS — R1310 Dysphagia, unspecified: Secondary | ICD-10-CM | POA: Diagnosis not present

## 2017-08-15 DIAGNOSIS — E785 Hyperlipidemia, unspecified: Secondary | ICD-10-CM | POA: Diagnosis not present

## 2017-08-15 DIAGNOSIS — R251 Tremor, unspecified: Secondary | ICD-10-CM | POA: Diagnosis not present

## 2017-08-15 DIAGNOSIS — N400 Enlarged prostate without lower urinary tract symptoms: Secondary | ICD-10-CM | POA: Diagnosis not present

## 2017-08-18 DIAGNOSIS — R569 Unspecified convulsions: Secondary | ICD-10-CM | POA: Diagnosis not present

## 2017-08-18 DIAGNOSIS — G903 Multi-system degeneration of the autonomic nervous system: Secondary | ICD-10-CM | POA: Diagnosis not present

## 2017-08-18 DIAGNOSIS — F419 Anxiety disorder, unspecified: Secondary | ICD-10-CM | POA: Diagnosis not present

## 2017-08-18 DIAGNOSIS — I951 Orthostatic hypotension: Secondary | ICD-10-CM | POA: Diagnosis not present

## 2017-08-18 DIAGNOSIS — I4891 Unspecified atrial fibrillation: Secondary | ICD-10-CM | POA: Diagnosis not present

## 2017-08-18 DIAGNOSIS — R251 Tremor, unspecified: Secondary | ICD-10-CM | POA: Diagnosis not present

## 2017-08-21 DIAGNOSIS — R569 Unspecified convulsions: Secondary | ICD-10-CM | POA: Diagnosis not present

## 2017-08-21 DIAGNOSIS — G903 Multi-system degeneration of the autonomic nervous system: Secondary | ICD-10-CM | POA: Diagnosis not present

## 2017-08-21 DIAGNOSIS — I951 Orthostatic hypotension: Secondary | ICD-10-CM | POA: Diagnosis not present

## 2017-08-21 DIAGNOSIS — I4891 Unspecified atrial fibrillation: Secondary | ICD-10-CM | POA: Diagnosis not present

## 2017-08-21 DIAGNOSIS — F419 Anxiety disorder, unspecified: Secondary | ICD-10-CM | POA: Diagnosis not present

## 2017-08-21 DIAGNOSIS — R251 Tremor, unspecified: Secondary | ICD-10-CM | POA: Diagnosis not present

## 2017-08-25 DIAGNOSIS — I951 Orthostatic hypotension: Secondary | ICD-10-CM | POA: Diagnosis not present

## 2017-08-25 DIAGNOSIS — R569 Unspecified convulsions: Secondary | ICD-10-CM | POA: Diagnosis not present

## 2017-08-25 DIAGNOSIS — G903 Multi-system degeneration of the autonomic nervous system: Secondary | ICD-10-CM | POA: Diagnosis not present

## 2017-08-25 DIAGNOSIS — F419 Anxiety disorder, unspecified: Secondary | ICD-10-CM | POA: Diagnosis not present

## 2017-08-25 DIAGNOSIS — I4891 Unspecified atrial fibrillation: Secondary | ICD-10-CM | POA: Diagnosis not present

## 2017-08-25 DIAGNOSIS — R251 Tremor, unspecified: Secondary | ICD-10-CM | POA: Diagnosis not present

## 2017-09-01 DIAGNOSIS — I951 Orthostatic hypotension: Secondary | ICD-10-CM | POA: Diagnosis not present

## 2017-09-01 DIAGNOSIS — R569 Unspecified convulsions: Secondary | ICD-10-CM | POA: Diagnosis not present

## 2017-09-01 DIAGNOSIS — I4891 Unspecified atrial fibrillation: Secondary | ICD-10-CM | POA: Diagnosis not present

## 2017-09-01 DIAGNOSIS — G903 Multi-system degeneration of the autonomic nervous system: Secondary | ICD-10-CM | POA: Diagnosis not present

## 2017-09-01 DIAGNOSIS — R251 Tremor, unspecified: Secondary | ICD-10-CM | POA: Diagnosis not present

## 2017-09-01 DIAGNOSIS — L600 Ingrowing nail: Secondary | ICD-10-CM | POA: Diagnosis not present

## 2017-09-01 DIAGNOSIS — F419 Anxiety disorder, unspecified: Secondary | ICD-10-CM | POA: Diagnosis not present

## 2017-09-10 DIAGNOSIS — R569 Unspecified convulsions: Secondary | ICD-10-CM | POA: Diagnosis not present

## 2017-09-10 DIAGNOSIS — I951 Orthostatic hypotension: Secondary | ICD-10-CM | POA: Diagnosis not present

## 2017-09-10 DIAGNOSIS — F419 Anxiety disorder, unspecified: Secondary | ICD-10-CM | POA: Diagnosis not present

## 2017-09-10 DIAGNOSIS — I4891 Unspecified atrial fibrillation: Secondary | ICD-10-CM | POA: Diagnosis not present

## 2017-09-10 DIAGNOSIS — G903 Multi-system degeneration of the autonomic nervous system: Secondary | ICD-10-CM | POA: Diagnosis not present

## 2017-09-10 DIAGNOSIS — R251 Tremor, unspecified: Secondary | ICD-10-CM | POA: Diagnosis not present

## 2017-09-11 DIAGNOSIS — F419 Anxiety disorder, unspecified: Secondary | ICD-10-CM | POA: Diagnosis not present

## 2017-09-11 DIAGNOSIS — R251 Tremor, unspecified: Secondary | ICD-10-CM | POA: Diagnosis not present

## 2017-09-11 DIAGNOSIS — I951 Orthostatic hypotension: Secondary | ICD-10-CM | POA: Diagnosis not present

## 2017-09-11 DIAGNOSIS — R569 Unspecified convulsions: Secondary | ICD-10-CM | POA: Diagnosis not present

## 2017-09-11 DIAGNOSIS — G903 Multi-system degeneration of the autonomic nervous system: Secondary | ICD-10-CM | POA: Diagnosis not present

## 2017-09-11 DIAGNOSIS — I4891 Unspecified atrial fibrillation: Secondary | ICD-10-CM | POA: Diagnosis not present

## 2017-09-15 DIAGNOSIS — R1310 Dysphagia, unspecified: Secondary | ICD-10-CM | POA: Diagnosis not present

## 2017-09-15 DIAGNOSIS — E785 Hyperlipidemia, unspecified: Secondary | ICD-10-CM | POA: Diagnosis not present

## 2017-09-15 DIAGNOSIS — F0390 Unspecified dementia without behavioral disturbance: Secondary | ICD-10-CM | POA: Diagnosis not present

## 2017-09-15 DIAGNOSIS — G903 Multi-system degeneration of the autonomic nervous system: Secondary | ICD-10-CM | POA: Diagnosis not present

## 2017-09-15 DIAGNOSIS — L89301 Pressure ulcer of unspecified buttock, stage 1: Secondary | ICD-10-CM | POA: Diagnosis not present

## 2017-09-15 DIAGNOSIS — R251 Tremor, unspecified: Secondary | ICD-10-CM | POA: Diagnosis not present

## 2017-09-15 DIAGNOSIS — R569 Unspecified convulsions: Secondary | ICD-10-CM | POA: Diagnosis not present

## 2017-09-15 DIAGNOSIS — F419 Anxiety disorder, unspecified: Secondary | ICD-10-CM | POA: Diagnosis not present

## 2017-09-15 DIAGNOSIS — I4891 Unspecified atrial fibrillation: Secondary | ICD-10-CM | POA: Diagnosis not present

## 2017-09-15 DIAGNOSIS — I951 Orthostatic hypotension: Secondary | ICD-10-CM | POA: Diagnosis not present

## 2017-09-15 DIAGNOSIS — N400 Enlarged prostate without lower urinary tract symptoms: Secondary | ICD-10-CM | POA: Diagnosis not present

## 2017-09-18 DIAGNOSIS — R591 Generalized enlarged lymph nodes: Secondary | ICD-10-CM | POA: Diagnosis not present

## 2017-09-18 DIAGNOSIS — G40101 Localization-related (focal) (partial) symptomatic epilepsy and epileptic syndromes with simple partial seizures, not intractable, with status epilepticus: Secondary | ICD-10-CM | POA: Diagnosis not present

## 2017-09-18 DIAGNOSIS — Z95810 Presence of automatic (implantable) cardiac defibrillator: Secondary | ICD-10-CM | POA: Diagnosis not present

## 2017-09-18 DIAGNOSIS — I4891 Unspecified atrial fibrillation: Secondary | ICD-10-CM | POA: Diagnosis not present

## 2017-09-18 DIAGNOSIS — J9601 Acute respiratory failure with hypoxia: Secondary | ICD-10-CM | POA: Diagnosis not present

## 2017-09-18 DIAGNOSIS — R2681 Unsteadiness on feet: Secondary | ICD-10-CM | POA: Diagnosis not present

## 2017-09-18 DIAGNOSIS — E871 Hypo-osmolality and hyponatremia: Secondary | ICD-10-CM | POA: Diagnosis not present

## 2017-09-22 DIAGNOSIS — F419 Anxiety disorder, unspecified: Secondary | ICD-10-CM | POA: Diagnosis not present

## 2017-09-22 DIAGNOSIS — I4891 Unspecified atrial fibrillation: Secondary | ICD-10-CM | POA: Diagnosis not present

## 2017-09-22 DIAGNOSIS — I951 Orthostatic hypotension: Secondary | ICD-10-CM | POA: Diagnosis not present

## 2017-09-22 DIAGNOSIS — G903 Multi-system degeneration of the autonomic nervous system: Secondary | ICD-10-CM | POA: Diagnosis not present

## 2017-09-22 DIAGNOSIS — R569 Unspecified convulsions: Secondary | ICD-10-CM | POA: Diagnosis not present

## 2017-09-22 DIAGNOSIS — R251 Tremor, unspecified: Secondary | ICD-10-CM | POA: Diagnosis not present

## 2017-09-29 DIAGNOSIS — E871 Hypo-osmolality and hyponatremia: Secondary | ICD-10-CM | POA: Diagnosis not present

## 2017-09-29 DIAGNOSIS — G40101 Localization-related (focal) (partial) symptomatic epilepsy and epileptic syndromes with simple partial seizures, not intractable, with status epilepticus: Secondary | ICD-10-CM | POA: Diagnosis not present

## 2017-09-30 DIAGNOSIS — E871 Hypo-osmolality and hyponatremia: Secondary | ICD-10-CM | POA: Diagnosis not present

## 2017-10-02 DIAGNOSIS — R251 Tremor, unspecified: Secondary | ICD-10-CM | POA: Diagnosis not present

## 2017-10-02 DIAGNOSIS — I4891 Unspecified atrial fibrillation: Secondary | ICD-10-CM | POA: Diagnosis not present

## 2017-10-02 DIAGNOSIS — R569 Unspecified convulsions: Secondary | ICD-10-CM | POA: Diagnosis not present

## 2017-10-02 DIAGNOSIS — G903 Multi-system degeneration of the autonomic nervous system: Secondary | ICD-10-CM | POA: Diagnosis not present

## 2017-10-02 DIAGNOSIS — I951 Orthostatic hypotension: Secondary | ICD-10-CM | POA: Diagnosis not present

## 2017-10-02 DIAGNOSIS — F419 Anxiety disorder, unspecified: Secondary | ICD-10-CM | POA: Diagnosis not present

## 2017-10-06 DIAGNOSIS — E871 Hypo-osmolality and hyponatremia: Secondary | ICD-10-CM | POA: Diagnosis not present

## 2017-10-10 DIAGNOSIS — G40309 Generalized idiopathic epilepsy and epileptic syndromes, not intractable, without status epilepticus: Secondary | ICD-10-CM | POA: Diagnosis not present

## 2017-10-10 DIAGNOSIS — E871 Hypo-osmolality and hyponatremia: Secondary | ICD-10-CM | POA: Diagnosis not present

## 2017-10-10 DIAGNOSIS — Z431 Encounter for attention to gastrostomy: Secondary | ICD-10-CM | POA: Diagnosis not present

## 2017-10-14 DIAGNOSIS — I4892 Unspecified atrial flutter: Secondary | ICD-10-CM | POA: Diagnosis not present

## 2017-10-14 DIAGNOSIS — G3184 Mild cognitive impairment, so stated: Secondary | ICD-10-CM | POA: Diagnosis not present

## 2017-10-14 DIAGNOSIS — G903 Multi-system degeneration of the autonomic nervous system: Secondary | ICD-10-CM | POA: Diagnosis not present

## 2017-10-14 DIAGNOSIS — R1310 Dysphagia, unspecified: Secondary | ICD-10-CM | POA: Diagnosis not present

## 2017-10-14 DIAGNOSIS — E871 Hypo-osmolality and hyponatremia: Secondary | ICD-10-CM | POA: Diagnosis not present

## 2017-10-14 DIAGNOSIS — N4231 Prostatic intraepithelial neoplasia: Secondary | ICD-10-CM | POA: Diagnosis not present

## 2017-10-14 DIAGNOSIS — I480 Paroxysmal atrial fibrillation: Secondary | ICD-10-CM | POA: Diagnosis not present

## 2017-10-14 DIAGNOSIS — G40909 Epilepsy, unspecified, not intractable, without status epilepticus: Secondary | ICD-10-CM | POA: Diagnosis not present

## 2017-10-14 DIAGNOSIS — G629 Polyneuropathy, unspecified: Secondary | ICD-10-CM | POA: Diagnosis not present

## 2017-10-14 DIAGNOSIS — Z7901 Long term (current) use of anticoagulants: Secondary | ICD-10-CM | POA: Diagnosis not present

## 2017-10-16 DIAGNOSIS — N4231 Prostatic intraepithelial neoplasia: Secondary | ICD-10-CM | POA: Diagnosis not present

## 2017-10-16 DIAGNOSIS — G40909 Epilepsy, unspecified, not intractable, without status epilepticus: Secondary | ICD-10-CM | POA: Diagnosis not present

## 2017-10-16 DIAGNOSIS — I480 Paroxysmal atrial fibrillation: Secondary | ICD-10-CM | POA: Diagnosis not present

## 2017-10-16 DIAGNOSIS — G903 Multi-system degeneration of the autonomic nervous system: Secondary | ICD-10-CM | POA: Diagnosis not present

## 2017-10-16 DIAGNOSIS — I4892 Unspecified atrial flutter: Secondary | ICD-10-CM | POA: Diagnosis not present

## 2017-10-16 DIAGNOSIS — R1310 Dysphagia, unspecified: Secondary | ICD-10-CM | POA: Diagnosis not present

## 2017-10-20 DIAGNOSIS — R1310 Dysphagia, unspecified: Secondary | ICD-10-CM | POA: Diagnosis not present

## 2017-10-20 DIAGNOSIS — N4231 Prostatic intraepithelial neoplasia: Secondary | ICD-10-CM | POA: Diagnosis not present

## 2017-10-20 DIAGNOSIS — I480 Paroxysmal atrial fibrillation: Secondary | ICD-10-CM | POA: Diagnosis not present

## 2017-10-20 DIAGNOSIS — G903 Multi-system degeneration of the autonomic nervous system: Secondary | ICD-10-CM | POA: Diagnosis not present

## 2017-10-20 DIAGNOSIS — I4892 Unspecified atrial flutter: Secondary | ICD-10-CM | POA: Diagnosis not present

## 2017-10-20 DIAGNOSIS — G40909 Epilepsy, unspecified, not intractable, without status epilepticus: Secondary | ICD-10-CM | POA: Diagnosis not present

## 2017-10-22 DIAGNOSIS — R591 Generalized enlarged lymph nodes: Secondary | ICD-10-CM | POA: Diagnosis not present

## 2017-10-23 DIAGNOSIS — N4231 Prostatic intraepithelial neoplasia: Secondary | ICD-10-CM | POA: Diagnosis not present

## 2017-10-23 DIAGNOSIS — I480 Paroxysmal atrial fibrillation: Secondary | ICD-10-CM | POA: Diagnosis not present

## 2017-10-23 DIAGNOSIS — I4892 Unspecified atrial flutter: Secondary | ICD-10-CM | POA: Diagnosis not present

## 2017-10-23 DIAGNOSIS — R1310 Dysphagia, unspecified: Secondary | ICD-10-CM | POA: Diagnosis not present

## 2017-10-23 DIAGNOSIS — G903 Multi-system degeneration of the autonomic nervous system: Secondary | ICD-10-CM | POA: Diagnosis not present

## 2017-10-23 DIAGNOSIS — G40909 Epilepsy, unspecified, not intractable, without status epilepticus: Secondary | ICD-10-CM | POA: Diagnosis not present

## 2017-10-24 DIAGNOSIS — R972 Elevated prostate specific antigen [PSA]: Secondary | ICD-10-CM | POA: Diagnosis not present

## 2017-10-28 DIAGNOSIS — N4231 Prostatic intraepithelial neoplasia: Secondary | ICD-10-CM | POA: Diagnosis not present

## 2017-10-28 DIAGNOSIS — I480 Paroxysmal atrial fibrillation: Secondary | ICD-10-CM | POA: Diagnosis not present

## 2017-10-28 DIAGNOSIS — G40909 Epilepsy, unspecified, not intractable, without status epilepticus: Secondary | ICD-10-CM | POA: Diagnosis not present

## 2017-10-28 DIAGNOSIS — R1310 Dysphagia, unspecified: Secondary | ICD-10-CM | POA: Diagnosis not present

## 2017-10-28 DIAGNOSIS — I4892 Unspecified atrial flutter: Secondary | ICD-10-CM | POA: Diagnosis not present

## 2017-10-28 DIAGNOSIS — G903 Multi-system degeneration of the autonomic nervous system: Secondary | ICD-10-CM | POA: Diagnosis not present

## 2017-11-05 DIAGNOSIS — I480 Paroxysmal atrial fibrillation: Secondary | ICD-10-CM | POA: Diagnosis not present

## 2017-11-05 DIAGNOSIS — G40909 Epilepsy, unspecified, not intractable, without status epilepticus: Secondary | ICD-10-CM | POA: Diagnosis not present

## 2017-11-05 DIAGNOSIS — R1310 Dysphagia, unspecified: Secondary | ICD-10-CM | POA: Diagnosis not present

## 2017-11-05 DIAGNOSIS — N4231 Prostatic intraepithelial neoplasia: Secondary | ICD-10-CM | POA: Diagnosis not present

## 2017-11-05 DIAGNOSIS — G903 Multi-system degeneration of the autonomic nervous system: Secondary | ICD-10-CM | POA: Diagnosis not present

## 2017-11-05 DIAGNOSIS — I4892 Unspecified atrial flutter: Secondary | ICD-10-CM | POA: Diagnosis not present

## 2017-11-11 DIAGNOSIS — I4892 Unspecified atrial flutter: Secondary | ICD-10-CM | POA: Diagnosis not present

## 2017-11-11 DIAGNOSIS — N4231 Prostatic intraepithelial neoplasia: Secondary | ICD-10-CM | POA: Diagnosis not present

## 2017-11-11 DIAGNOSIS — G40909 Epilepsy, unspecified, not intractable, without status epilepticus: Secondary | ICD-10-CM | POA: Diagnosis not present

## 2017-11-11 DIAGNOSIS — G903 Multi-system degeneration of the autonomic nervous system: Secondary | ICD-10-CM | POA: Diagnosis not present

## 2017-11-11 DIAGNOSIS — I480 Paroxysmal atrial fibrillation: Secondary | ICD-10-CM | POA: Diagnosis not present

## 2017-11-11 DIAGNOSIS — R1310 Dysphagia, unspecified: Secondary | ICD-10-CM | POA: Diagnosis not present

## 2017-11-12 DIAGNOSIS — R2681 Unsteadiness on feet: Secondary | ICD-10-CM | POA: Diagnosis not present

## 2017-11-12 DIAGNOSIS — I4891 Unspecified atrial fibrillation: Secondary | ICD-10-CM | POA: Diagnosis not present

## 2017-11-19 DIAGNOSIS — G40909 Epilepsy, unspecified, not intractable, without status epilepticus: Secondary | ICD-10-CM | POA: Diagnosis not present

## 2017-11-19 DIAGNOSIS — I4892 Unspecified atrial flutter: Secondary | ICD-10-CM | POA: Diagnosis not present

## 2017-11-19 DIAGNOSIS — N4231 Prostatic intraepithelial neoplasia: Secondary | ICD-10-CM | POA: Diagnosis not present

## 2017-11-19 DIAGNOSIS — R1310 Dysphagia, unspecified: Secondary | ICD-10-CM | POA: Diagnosis not present

## 2017-11-19 DIAGNOSIS — G903 Multi-system degeneration of the autonomic nervous system: Secondary | ICD-10-CM | POA: Diagnosis not present

## 2017-11-19 DIAGNOSIS — I480 Paroxysmal atrial fibrillation: Secondary | ICD-10-CM | POA: Diagnosis not present

## 2017-11-26 DIAGNOSIS — E871 Hypo-osmolality and hyponatremia: Secondary | ICD-10-CM | POA: Diagnosis not present

## 2017-11-26 DIAGNOSIS — I4891 Unspecified atrial fibrillation: Secondary | ICD-10-CM | POA: Diagnosis not present

## 2017-12-02 DIAGNOSIS — I4891 Unspecified atrial fibrillation: Secondary | ICD-10-CM | POA: Diagnosis not present

## 2017-12-02 DIAGNOSIS — E871 Hypo-osmolality and hyponatremia: Secondary | ICD-10-CM | POA: Diagnosis not present

## 2017-12-02 DIAGNOSIS — Z95810 Presence of automatic (implantable) cardiac defibrillator: Secondary | ICD-10-CM | POA: Diagnosis not present

## 2017-12-02 DIAGNOSIS — R2681 Unsteadiness on feet: Secondary | ICD-10-CM | POA: Diagnosis not present

## 2017-12-02 DIAGNOSIS — G40101 Localization-related (focal) (partial) symptomatic epilepsy and epileptic syndromes with simple partial seizures, not intractable, with status epilepticus: Secondary | ICD-10-CM | POA: Diagnosis not present

## 2017-12-13 DIAGNOSIS — Z978 Presence of other specified devices: Secondary | ICD-10-CM | POA: Diagnosis not present

## 2017-12-13 DIAGNOSIS — Z743 Need for continuous supervision: Secondary | ICD-10-CM | POA: Diagnosis not present

## 2017-12-13 DIAGNOSIS — G40911 Epilepsy, unspecified, intractable, with status epilepticus: Secondary | ICD-10-CM | POA: Diagnosis not present

## 2017-12-13 DIAGNOSIS — E785 Hyperlipidemia, unspecified: Secondary | ICD-10-CM | POA: Diagnosis not present

## 2017-12-13 DIAGNOSIS — G629 Polyneuropathy, unspecified: Secondary | ICD-10-CM | POA: Diagnosis not present

## 2017-12-13 DIAGNOSIS — R569 Unspecified convulsions: Secondary | ICD-10-CM | POA: Diagnosis not present

## 2017-12-13 DIAGNOSIS — I4891 Unspecified atrial fibrillation: Secondary | ICD-10-CM | POA: Diagnosis not present

## 2017-12-13 DIAGNOSIS — G20 Parkinson's disease: Secondary | ICD-10-CM | POA: Diagnosis not present

## 2017-12-13 DIAGNOSIS — G40111 Localization-related (focal) (partial) symptomatic epilepsy and epileptic syndromes with simple partial seizures, intractable, with status epilepticus: Secondary | ICD-10-CM | POA: Diagnosis present

## 2017-12-13 DIAGNOSIS — Z969 Presence of functional implant, unspecified: Secondary | ICD-10-CM | POA: Diagnosis present

## 2017-12-14 DIAGNOSIS — Z978 Presence of other specified devices: Secondary | ICD-10-CM | POA: Diagnosis not present

## 2017-12-14 DIAGNOSIS — G40911 Epilepsy, unspecified, intractable, with status epilepticus: Secondary | ICD-10-CM | POA: Diagnosis not present

## 2017-12-14 DIAGNOSIS — G629 Polyneuropathy, unspecified: Secondary | ICD-10-CM | POA: Insufficient documentation

## 2017-12-22 DIAGNOSIS — G40909 Epilepsy, unspecified, not intractable, without status epilepticus: Secondary | ICD-10-CM | POA: Diagnosis not present

## 2017-12-22 DIAGNOSIS — G40911 Epilepsy, unspecified, intractable, with status epilepticus: Secondary | ICD-10-CM | POA: Diagnosis not present

## 2017-12-26 DIAGNOSIS — G40911 Epilepsy, unspecified, intractable, with status epilepticus: Secondary | ICD-10-CM | POA: Diagnosis not present

## 2017-12-26 DIAGNOSIS — R2681 Unsteadiness on feet: Secondary | ICD-10-CM | POA: Diagnosis not present

## 2017-12-26 DIAGNOSIS — I4891 Unspecified atrial fibrillation: Secondary | ICD-10-CM | POA: Diagnosis not present

## 2017-12-29 DIAGNOSIS — R972 Elevated prostate specific antigen [PSA]: Secondary | ICD-10-CM | POA: Diagnosis not present

## 2017-12-29 DIAGNOSIS — E871 Hypo-osmolality and hyponatremia: Secondary | ICD-10-CM | POA: Diagnosis not present

## 2017-12-29 DIAGNOSIS — I4891 Unspecified atrial fibrillation: Secondary | ICD-10-CM | POA: Diagnosis not present

## 2017-12-29 DIAGNOSIS — G40909 Epilepsy, unspecified, not intractable, without status epilepticus: Secondary | ICD-10-CM | POA: Diagnosis not present

## 2017-12-29 DIAGNOSIS — G40911 Epilepsy, unspecified, intractable, with status epilepticus: Secondary | ICD-10-CM | POA: Diagnosis not present

## 2018-01-05 DIAGNOSIS — E871 Hypo-osmolality and hyponatremia: Secondary | ICD-10-CM | POA: Diagnosis not present

## 2018-01-05 DIAGNOSIS — R748 Abnormal levels of other serum enzymes: Secondary | ICD-10-CM | POA: Diagnosis not present

## 2018-01-05 DIAGNOSIS — G40909 Epilepsy, unspecified, not intractable, without status epilepticus: Secondary | ICD-10-CM | POA: Diagnosis not present

## 2018-01-05 DIAGNOSIS — I4891 Unspecified atrial fibrillation: Secondary | ICD-10-CM | POA: Diagnosis not present

## 2018-01-16 DIAGNOSIS — E871 Hypo-osmolality and hyponatremia: Secondary | ICD-10-CM | POA: Diagnosis not present

## 2018-01-16 DIAGNOSIS — I4891 Unspecified atrial fibrillation: Secondary | ICD-10-CM | POA: Diagnosis not present

## 2018-01-16 DIAGNOSIS — G40909 Epilepsy, unspecified, not intractable, without status epilepticus: Secondary | ICD-10-CM | POA: Diagnosis not present

## 2018-01-19 DIAGNOSIS — E871 Hypo-osmolality and hyponatremia: Secondary | ICD-10-CM | POA: Diagnosis not present

## 2018-01-21 DIAGNOSIS — E785 Hyperlipidemia, unspecified: Secondary | ICD-10-CM | POA: Diagnosis not present

## 2018-01-21 DIAGNOSIS — Z95810 Presence of automatic (implantable) cardiac defibrillator: Secondary | ICD-10-CM | POA: Diagnosis not present

## 2018-01-21 DIAGNOSIS — G20 Parkinson's disease: Secondary | ICD-10-CM | POA: Diagnosis present

## 2018-01-21 DIAGNOSIS — G40309 Generalized idiopathic epilepsy and epileptic syndromes, not intractable, without status epilepticus: Secondary | ICD-10-CM | POA: Diagnosis not present

## 2018-01-21 DIAGNOSIS — I4891 Unspecified atrial fibrillation: Secondary | ICD-10-CM | POA: Diagnosis not present

## 2018-01-21 DIAGNOSIS — G40911 Epilepsy, unspecified, intractable, with status epilepticus: Secondary | ICD-10-CM | POA: Diagnosis not present

## 2018-01-21 DIAGNOSIS — Z66 Do not resuscitate: Secondary | ICD-10-CM | POA: Diagnosis present

## 2018-01-21 DIAGNOSIS — Z87891 Personal history of nicotine dependence: Secondary | ICD-10-CM | POA: Diagnosis not present

## 2018-01-21 DIAGNOSIS — Z789 Other specified health status: Secondary | ICD-10-CM | POA: Diagnosis not present

## 2018-01-30 DIAGNOSIS — G40309 Generalized idiopathic epilepsy and epileptic syndromes, not intractable, without status epilepticus: Secondary | ICD-10-CM | POA: Diagnosis not present

## 2018-01-30 DIAGNOSIS — G40911 Epilepsy, unspecified, intractable, with status epilepticus: Secondary | ICD-10-CM | POA: Diagnosis not present

## 2018-01-30 DIAGNOSIS — I4891 Unspecified atrial fibrillation: Secondary | ICD-10-CM | POA: Diagnosis not present

## 2018-02-02 DIAGNOSIS — E871 Hypo-osmolality and hyponatremia: Secondary | ICD-10-CM | POA: Diagnosis not present

## 2018-02-10 ENCOUNTER — Encounter: Payer: Self-pay | Admitting: Internal Medicine

## 2018-02-26 DIAGNOSIS — I4891 Unspecified atrial fibrillation: Secondary | ICD-10-CM | POA: Diagnosis not present

## 2018-02-26 DIAGNOSIS — R261 Paralytic gait: Secondary | ICD-10-CM | POA: Diagnosis not present

## 2018-02-26 DIAGNOSIS — E871 Hypo-osmolality and hyponatremia: Secondary | ICD-10-CM | POA: Diagnosis not present

## 2018-02-26 DIAGNOSIS — F4322 Adjustment disorder with anxiety: Secondary | ICD-10-CM | POA: Diagnosis not present

## 2018-02-26 DIAGNOSIS — G40909 Epilepsy, unspecified, not intractable, without status epilepticus: Secondary | ICD-10-CM | POA: Diagnosis not present

## 2018-02-26 DIAGNOSIS — R278 Other lack of coordination: Secondary | ICD-10-CM | POA: Diagnosis not present

## 2018-02-27 DIAGNOSIS — F4322 Adjustment disorder with anxiety: Secondary | ICD-10-CM | POA: Diagnosis not present

## 2018-02-27 DIAGNOSIS — E871 Hypo-osmolality and hyponatremia: Secondary | ICD-10-CM | POA: Diagnosis not present

## 2018-02-27 DIAGNOSIS — I4891 Unspecified atrial fibrillation: Secondary | ICD-10-CM | POA: Diagnosis not present

## 2018-02-27 DIAGNOSIS — G40909 Epilepsy, unspecified, not intractable, without status epilepticus: Secondary | ICD-10-CM | POA: Diagnosis not present

## 2018-02-27 DIAGNOSIS — R261 Paralytic gait: Secondary | ICD-10-CM | POA: Diagnosis not present

## 2018-02-27 DIAGNOSIS — R278 Other lack of coordination: Secondary | ICD-10-CM | POA: Diagnosis not present

## 2018-03-02 DIAGNOSIS — F4322 Adjustment disorder with anxiety: Secondary | ICD-10-CM | POA: Diagnosis not present

## 2018-03-02 DIAGNOSIS — R278 Other lack of coordination: Secondary | ICD-10-CM | POA: Diagnosis not present

## 2018-03-02 DIAGNOSIS — R261 Paralytic gait: Secondary | ICD-10-CM | POA: Diagnosis not present

## 2018-03-02 DIAGNOSIS — E871 Hypo-osmolality and hyponatremia: Secondary | ICD-10-CM | POA: Diagnosis not present

## 2018-03-02 DIAGNOSIS — I4891 Unspecified atrial fibrillation: Secondary | ICD-10-CM | POA: Diagnosis not present

## 2018-03-02 DIAGNOSIS — G40909 Epilepsy, unspecified, not intractable, without status epilepticus: Secondary | ICD-10-CM | POA: Diagnosis not present

## 2018-03-04 ENCOUNTER — Ambulatory Visit (INDEPENDENT_AMBULATORY_CARE_PROVIDER_SITE_OTHER): Payer: Medicare Other | Admitting: Neurology

## 2018-03-04 ENCOUNTER — Encounter: Payer: Self-pay | Admitting: Internal Medicine

## 2018-03-04 ENCOUNTER — Encounter: Payer: Self-pay | Admitting: Neurology

## 2018-03-04 ENCOUNTER — Non-Acute Institutional Stay: Payer: Medicare Other | Admitting: Internal Medicine

## 2018-03-04 VITALS — BP 138/78 | HR 64 | Ht 68.0 in | Wt 166.5 lb

## 2018-03-04 VITALS — BP 120/80 | HR 76 | Temp 97.9°F | Ht 69.0 in | Wt 167.0 lb

## 2018-03-04 DIAGNOSIS — M8580 Other specified disorders of bone density and structure, unspecified site: Secondary | ICD-10-CM

## 2018-03-04 DIAGNOSIS — D075 Carcinoma in situ of prostate: Secondary | ICD-10-CM | POA: Diagnosis not present

## 2018-03-04 DIAGNOSIS — Z5181 Encounter for therapeutic drug level monitoring: Secondary | ICD-10-CM

## 2018-03-04 DIAGNOSIS — H47233 Glaucomatous optic atrophy, bilateral: Secondary | ICD-10-CM | POA: Diagnosis not present

## 2018-03-04 DIAGNOSIS — G40211 Localization-related (focal) (partial) symptomatic epilepsy and epileptic syndromes with complex partial seizures, intractable, with status epilepticus: Secondary | ICD-10-CM

## 2018-03-04 DIAGNOSIS — Z9689 Presence of other specified functional implants: Secondary | ICD-10-CM

## 2018-03-04 DIAGNOSIS — G20 Parkinson's disease: Secondary | ICD-10-CM

## 2018-03-04 DIAGNOSIS — E559 Vitamin D deficiency, unspecified: Secondary | ICD-10-CM

## 2018-03-04 DIAGNOSIS — R269 Unspecified abnormalities of gait and mobility: Secondary | ICD-10-CM | POA: Diagnosis not present

## 2018-03-04 DIAGNOSIS — Z95810 Presence of automatic (implantable) cardiac defibrillator: Secondary | ICD-10-CM | POA: Diagnosis not present

## 2018-03-04 DIAGNOSIS — E871 Hypo-osmolality and hyponatremia: Secondary | ICD-10-CM

## 2018-03-04 DIAGNOSIS — L111 Transient acantholytic dermatosis [Grover]: Secondary | ICD-10-CM

## 2018-03-04 DIAGNOSIS — I482 Chronic atrial fibrillation, unspecified: Secondary | ICD-10-CM

## 2018-03-04 DIAGNOSIS — F4322 Adjustment disorder with anxiety: Secondary | ICD-10-CM | POA: Diagnosis not present

## 2018-03-04 DIAGNOSIS — G40919 Epilepsy, unspecified, intractable, without status epilepticus: Secondary | ICD-10-CM

## 2018-03-04 DIAGNOSIS — G609 Hereditary and idiopathic neuropathy, unspecified: Secondary | ICD-10-CM

## 2018-03-04 DIAGNOSIS — I951 Orthostatic hypotension: Secondary | ICD-10-CM

## 2018-03-04 DIAGNOSIS — G903 Multi-system degeneration of the autonomic nervous system: Secondary | ICD-10-CM | POA: Diagnosis not present

## 2018-03-04 NOTE — Progress Notes (Signed)
Reason for visit: Intractable seizures  Cameron Potts is a 82 y.o. male  History of present illness:  Cameron Potts is an 83 year old right-handed white male with a history of intractable seizures.  He was seen here previously greater than 3 years ago, he has spent most of his time in [place].  He has been on Dilantin, Vimpat, and Keppra for quite a number of years.  By history, he tends to have a cluster of seizures about every 6 months or so.  He has had epilepsy surgery in the past but it did not result in lasting seizure control.  The patient has had a vagal nerve stimulator placement.  The patient may feel the seizure coming on with some left-sided numbness, he will swipe the vagal nerve stimulator, he may oftentimes however have prolonged seizure events associated with altered mental status, he generally does not have convulsive seizures.  Usually, he is given Ativan for these events.  The patient has moved to the [place] area, he is now living at [facility].  The patient has had in the past some issues with orthostatic hypotension, there has been some question whether he has multisystem atrophy or Parkinson's disease.  The patient has been treated with salt supplementation, he has done better in this regard.  He reports no headaches, no vision changes, he denies issues with swallowing or choking, he denies any numbness or weakness of the extremities.  He usually walks with a cane, he has not had any recent falls.  He denies issues controlling the bowels or the bladder.  Past Medical History:  Diagnosis Date  . Abnormality of gait 12/21/2013  . Atrial fibrillation ([place])   . Blind left eye   . Cardiac arrest ([place])   . Glaucoma   . Hypercholesteremia   . Hyperlipidemia   . Mild left ventricular systolic dysfunction   . PSA elevation   . Right frontal lobe lesion   . Seizures ([place])   . Urinary urgency     Past Surgical History:  Procedure Laterality Date  . CARDIAC DEFIBRILLATOR  PLACEMENT    . cataract surgery    . COLONOSCOPY    . CRANIOTOMY Right    right anterior temporal resection  . defibrillator replaced  March 2016  . IMPLANTATION VAGAL NERVE STIMULATOR  June 2016  . NASAL SINUS SURGERY    . TONSILLECTOMY     age 61  . VASECTOMY  1985    Family History  Problem Relation Age of Onset  . Cancer - Lung Mother   . Cancer - Prostate Father     Social history:  reports that he has quit smoking. His smoking use included cigarettes. He has a 7.50 pack-year smoking history. He has never used smokeless tobacco. He reports that he does not drink alcohol or use drugs.  Medications:  Prior to Admission medications   Medication Sig Start Date End Date Taking? Authorizing Provider  ALPRAZolam (XANAX) 0.25 MG tablet Take 0.25 mg by mouth 2 (two) times daily.  11/26/17  Yes [provider]  bimatoprost (LUMIGAN) 0.01 % SOLN Apply to eye. 11/03/17  Yes [provider]  brimonidine-timolol (COMBIGAN) 0.2-0.5 % ophthalmic solution INSTILL 1 DROP IN [facility] EYE EVERY 12 HOURS 01/05/18  Yes [provider]  finasteride (PROSCAR) 5 MG tablet Take 5 mg by mouth daily.    Yes [provider]  lacosamide (VIMPAT) 200 MG TABS tablet Take 200 mg by mouth 2 (two) times daily.  09/30/17 10/01/18  Yes [provider]  lacosamide (VIMPAT) 50 MG TABS tablet Take 1 tablet (50 mg total) by mouth 2 (two) times daily. 01/05/17  Yes Cherene Altes, MD  levETIRAcetam (KEPPRA) 750 MG tablet Take 1,500 mg by mouth 2 (two) times daily. 12/23/16  Yes [provider]  phenytoin (DILANTIN) 100 MG ER capsule Take 190mg  (1 100mg  and 3 30mg ) in the morning and 200mg  (2 100mg ) in the evening. 01/22/18  Yes [provider]  phenytoin (DILANTIN) 30 MG ER capsule PLEASE SEE ATTACHED FOR DETAILED DIRECTIONS 01/22/18  Yes [provider]  sodium chloride 1 g tablet Take 1 g by mouth daily.   Yes [provider]  VITAMIN D,  ERGOCALCIFEROL, PO Take 1 tablet by mouth daily.   Yes [provider]  warfarin (COUMADIN) 2.5 MG tablet Take by mouth. 01/27/18  Yes [provider]      Allergies  Allergen Reactions  . Depakote [Divalproex Sodium] Other (See Comments)    Arthralgias     ROS:  Out of a complete 14 system review of symptoms, the patient complains only of the following symptoms, and all other reviewed systems are negative.  Eye itching Heat intolerance Frequency of urination Walking difficulty Skin wounds Bruising easily Memory loss, seizures, tremors Confusion  Blood pressure 138/78, pulse 64, height 5\' 8"  (1.727 m), weight 166 lb 8 oz (75.5 kg).  Physical Exam  General: The patient is alert and cooperative at the time of the examination.  Eyes: Pupils are equal, round, and reactive to light. Discs are flat bilaterally.  Neck: The neck is supple, no carotid bruits are noted.  Respiratory: The respiratory examination is clear.  Cardiovascular: The cardiovascular examination reveals a regular rate and rhythm, no obvious murmurs or rubs are noted.  Skin: Extremities are with slight edema below the knees bilaterally.  Neurologic Exam  Mental status: The patient is alert and oriented x 3 at the time of the examination. The patient has apparent normal recent and remote memory, with an apparently normal attention span and concentration ability.  Cranial nerves: Facial symmetry is present. There is good sensation of the face to pinprick and soft touch bilaterally. The strength of the facial muscles and the muscles to head turning and shoulder shrug are normal bilaterally. Speech is well enunciated, no aphasia or dysarthria is noted. Extraocular movements are full. Visual fields are full. The tongue is midline, and the patient has symmetric elevation of the soft palate. No obvious hearing deficits are noted.  Mild masking of the face is seen.  Motor: The motor testing reveals  5 over 5 strength of all 4 extremities. Good symmetric motor tone is noted throughout.  Sensory: Sensory testing is intact to pinprick, soft touch, vibration sensation, and position sense on all 4 extremities, with exception of decreased position sense in the right foot. No evidence of extinction is noted.  Coordination: Cerebellar testing reveals good finger-nose-finger and heel-to-shin bilaterally.  Gait and station: Gait is slightly unsteady, the patient can walk independently, he usually uses a cane.  His posture is slightly stooped.  The patient has good symmetric arm swing with walking.  Tandem gait is unsteady.  Romberg is positive, the patient goes backwards.  Reflexes: Deep tendon reflexes are symmetric, but are depressed bilaterally. Toes are downgoing bilaterally.   Assessment/Plan:  1.  Intractable seizure disorder  2.  History of orthostatic hypotension  3.  Gait disorder  4.  Reported memory disturbance  5.  Vagal nerve  stimulator placement  The patient is to remain on Vimpat, Dilantin, and Keppra.  He is also on Coumadin therapy.  In the past, he has not tolerated Depakote.  The patient will have blood work done today.  The patient will follow-up in 6 months.  They are to call if any new issues arise.  Jill Alexanders MD 03/04/2018 12:51 PM  [facility] [address] [place], [place] [address]  Phone [phone] Fax [phone]

## 2018-03-04 NOTE — Progress Notes (Signed)
Provider:  Rexene Edison. Mariea Clonts, D.O., C.M.D. Location:  Occupational psychologist of Service:  Clinic (12)  Previous PCP: Gayland Curry, DO Patient Care Team: Gayland Curry, DO as PCP - General (Geriatric Medicine)  Extended Emergency Contact Information Primary Emergency Contact: Rubenstein,[name] Address: [address] [place] [address] Johnnette Litter of [place] Phone: [phone] Mobile Phone: [phone] Relation: Spouse  Code Status: DNR Goals of Care: Advanced Directive information Advanced Directives 03/04/2018  Does Patient Have a Medical Advance Directive? Yes  Type of Paramedic of [place];Living will;Out of facility DNR (pink MOST or yellow form)  Does patient want to make changes to medical advance directive? No - Patient declined  Copy of [place] in Chart? Yes  Pre-existing out of facility DNR order (yellow form or pink MOST form) Yellow form placed in chart (order not valid for inpatient use)   Chief Complaint  Patient presents with  . Establish Care    New Patient    HPI: Patient is a 82 y.o. male with h/o Parkinson's syndrome, possibly multisystem atrophy with severe difficulty with neurogenic orthostatic hypotension, drug-resistant localization-related epilepsy, prior right anterior temporal lobectomy 2008, implantation of a vagal nerve stimulator, atrial fibrillation, gait abnormailty with falls, mild systolic chf, prior cardiac arrest now with defibrillator, glaucoma, blindness of left eye, hyperlipidemia, urinary urgency and elevated PSA seen today to establish with [facility].  He recently moved into assisted living.  He worked in Engineer, mining in [place].  He and his wife have had a home in [place] and also [place] and, due to his declining health, he moved into [facility].  She is currently receiving treatment for breast cancer in [place] and continues to travel back and forth from  there.    He is on coumadin for his afib due to his seizure disorder per pt and his wife.  He also takes flecainide.    Triamcinolone cream and HMR-uses for Grover's disease.  Sodium chloride tabs have helped greatlly with his orthostatic hypotension.  Currently, he has not had any seizures since his move here.  He is on dilantin, ativan, keppra, vimpat.  Dilantin is BRAND NAME.  Dr. Jannifer Franklin will regulate.  He's just established with him.  Pt has his vagal n stimulator on the right and a wand that can be swiped over it if he has seizure activity.  It is not to be held over it, just swiped over it.    Due to his prior cardiac arrest, he has a defibrillator on the left side of his chest.   For BPH, he is on proscar.  He apparently did have a biopsy with some prostate cancer, but adamantly denies it while his wife notes it's truth.    He has xanax for anxiety bid.  Past Medical History:  Diagnosis Date  . Abnormality of gait 12/21/2013  . Atrial fibrillation ([place])   . Blind left eye   . Cardiac arrest ([place])   . Glaucoma   . Hypercholesteremia   . Hyperlipidemia   . Mild left ventricular systolic dysfunction   . PSA elevation   . Right frontal lobe lesion   . Seizures ([place])   . Urinary urgency    Past Surgical History:  Procedure Laterality Date  . CARDIAC DEFIBRILLATOR PLACEMENT    . cataract surgery    . COLONOSCOPY    . CRANIOTOMY Right  right anterior temporal resection  . defibrillator replaced  March 2016  . IMPLANTATION VAGAL NERVE STIMULATOR  June 2016  . NASAL SINUS SURGERY    . TONSILLECTOMY     age 54  . VASECTOMY  1985    Social History   Socioeconomic History  . Marital status: Single    Spouse name: Not on file  . Number of children: 3  . Years of education: MBA  . Highest education level: Not on file  Occupational History  . Occupation: Retired  . Occupation: Physicist, medical Needs  . Financial resource strain: Not on file  . Food  insecurity:    Worry: Not on file    Inability: Not on file  . Transportation needs:    Medical: Not on file    Non-medical: Not on file  Tobacco Use  . Smoking status: Former Smoker    Packs/day: 0.75    Years: 10.00    Pack years: 7.50    Types: Cigarettes  . Smokeless tobacco: Never Used  . Tobacco comment: quit 1960s  Substance and Sexual Activity  . Alcohol use: No    Comment: former beer drinker  . Drug use: No  . Sexual activity: Not Currently  Lifestyle  . Physical activity:    Days per week: Not on file    Minutes per session: Not on file  . Stress: Not on file  Relationships  . Social connections:    Talks on phone: Not on file    Gets together: Not on file    Attends religious service: Not on file    Active member of club or organization: Not on file    Attends meetings of clubs or organizations: Not on file    Relationship status: Not on file  Other Topics Concern  . Not on file  Social History Narrative   Patient drinks 1-2 cups of caffeine daily.   Patient is right handed.      Tobacco use, amount per day ZPH:XTAV   Past tobacco use, amount per day:10   How many years did you use tobacco:20   Alcohol use (drinks per week): NONE   Diet:REGULAR   Do you drink/eat things with caffeine:COFFEE, PEPSI   Marital status: MARRIED                                 What year were you married? 1964   Do you live in a house, apartment, assisted living, condo, trailer, etc.? [place]   Is it one or more stories?   How many persons live in your home?   Do you have pets in your home?( please list) NO   Current or past profession: [employer]   Do you exercise?  YES                                Type and how often? 5 DAYS A WEEK FOR 30 MINUTES   Do you have a living will?YES   Do you have a DNR form?                                   If not, do you want to discuss one?   Do you have signed POA/HPOA forms?  If so, please  bring to you appointment          reports that he has quit smoking. His smoking use included cigarettes. He has a 7.50 pack-year smoking history. He has never used smokeless tobacco. He reports that he does not drink alcohol or use drugs.  Functional Status Survey:  currently requires cane or walker to ambulate, assistance with meds and appts, some short term memory loss--staying in [place]  Family History  Problem Relation Age of Onset  . Cancer - Lung Mother   . Cancer - Prostate Father     Health Maintenance  Topic Date Due  . INFLUENZA VACCINE  01/15/2018  . TETANUS/TDAP  12/06/2025  . PNA vac Low Risk Adult  Completed    Allergies  Allergen Reactions  . Depakote [Divalproex Sodium] Other (See Comments)    Arthralgias     Outpatient Encounter Medications as of 03/04/2018  Medication Sig  . ALPRAZolam (XANAX) 0.25 MG tablet Take 0.25 mg by mouth 2 (two) times daily.   . bimatoprost (LUMIGAN) 0.01 % SOLN Apply to eye.  . brimonidine-timolol (COMBIGAN) 0.2-0.5 % ophthalmic solution INSTILL 1 DROP IN EACH EYE EVERY 12 HOURS  . finasteride (PROSCAR) 5 MG tablet Take 5 mg by mouth daily.   . flecainide (TAMBOCOR) 50 MG tablet Take 50 mg by mouth 2 (two) times daily.  . fluocinonide ointment (LIDEX) 0.86 % Apply 1 application topically 2 (two) times daily as needed.  . lacosamide (VIMPAT) 200 MG TABS tablet Take 200 mg by mouth 2 (two) times daily.   Marland Kitchen levETIRAcetam (KEPPRA) 750 MG tablet Take 1,500 mg by mouth 2 (two) times daily.  Marland Kitchen LORazepam (ATIVAN) 2 MG tablet Take 2 mg by mouth daily as needed for seizure.  . phenytoin (DILANTIN) 100 MG ER capsule Take 190mg  (1 100mg  and 3 30mg ) in the morning and 200mg  (2 100mg ) in the evening.  . phenytoin (DILANTIN) 30 MG ER capsule PLEASE SEE ATTACHED FOR DETAILED DIRECTIONS  . sodium chloride 1 g tablet Take 1 g by mouth daily.  Marland Kitchen triamcinolone cream (KENALOG) 0.1 % Apply 1 application topically as needed.  Marland Kitchen VITAMIN D, ERGOCALCIFEROL,  PO Take 1 tablet by mouth daily.  Marland Kitchen warfarin (COUMADIN) 2.5 MG tablet Take 1 tablet (2.5 mg) on Tues, Thurs, Sat, Sun, Take 1 and a half tablets ( equals 3.75 mg) on Mon, Wed, Fri.  . [DISCONTINUED] lacosamide (VIMPAT) 50 MG TABS tablet Take 1 tablet (50 mg total) by mouth 2 (two) times daily.   No facility-administered encounter medications on file as of 03/04/2018.     Review of Systems  Constitutional: Negative for chills, fever and malaise/fatigue.  HENT: Positive for hearing loss. Negative for congestion.   Eyes:       Poor vision  Respiratory: Negative for cough and shortness of breath.   Cardiovascular: Negative for chest pain, palpitations and leg swelling.  Gastrointestinal: Negative for abdominal pain, blood in stool, constipation, diarrhea and melena.  Genitourinary: Positive for urgency. Negative for dysuria, flank pain, frequency and hematuria.  Musculoskeletal: Positive for falls. Negative for joint pain.  Skin: Positive for rash.  Neurological: Positive for seizures. Negative for dizziness, tingling, sensory change, loss of consciousness and weakness.  Psychiatric/Behavioral: Positive for memory loss. Negative for depression. The patient is nervous/anxious. The patient does not have insomnia.     Vitals:   03/04/18 1440  BP: 120/80  Pulse: 76  Temp: 97.9 F (36.6 C)  TempSrc: Oral  SpO2: 96%  Weight: 167 lb (75.8 kg)  Height: 5\' 9"  (1.753 m)   Body mass index is 24.66 kg/m. Physical Exam  Constitutional: He is oriented to person, place, and time. No distress.  HENT:  Head: Normocephalic and atraumatic.  Right Ear: External ear normal.  Left Ear: External ear normal.  Nose: Nose normal.  Mouth/Throat: Oropharynx is clear and moist. No oropharyngeal exudate.  Eyes: Pupils are equal, round, and reactive to light.  Neck: Neck supple. No JVD present. No thyromegaly present.  Cardiovascular: Intact distal pulses.  irreg irreg; defibrillator left chest    Pulmonary/Chest: Effort normal and breath sounds normal. No respiratory distress.  Abdominal: Soft. Bowel sounds are normal. He exhibits no distension. There is no tenderness.  Musculoskeletal: He exhibits no edema, tenderness or deformity.  Ambulates unsteadily with cane, also has walker, but not consistent with use as directed  Lymphadenopathy:    He has no cervical adenopathy.  Neurological: He is alert and oriented to person, place, and time. He exhibits abnormal muscle tone.  but poor historian about some facts and his wife fills in; masked facies  Skin: Skin is warm and dry.  Psychiatric: He has a normal mood and affect.  Was getting easily agitated during history when his wife was helping him with information    Labs reviewed:  Accessed in careeverywhere Basic Metabolic Panel: Last was 07/22/93  Last dilantin was 18.5 on 01/30/18  Liver Function Tests: No results for input(s): AST, ALT, ALKPHOS, BILITOT, PROT, ALBUMIN in the last 8760 hours. No results for input(s): LIPASE, AMYLASE in the last 8760 hours. No results for input(s): AMMONIA in the last 8760 hours.   CBC: Last h/h 11.7/33.1 on 01/21/18  Last PSA 9 on 12/29/17 Last tsh 3.7 12/26/17  Imaging and Procedures noted on new patient packet: In careeverywhere  Tubular adenoma in sigmoid 11/24/12  Assessment/Plan 1. Parkinsonian syndrome associated with symptomatic orthostatic hypotension (HCC) -? MSA or PSP or due to prior damage to brain with frontal lobectomy for seizures?, role of meds -not much tremor, primarily gait disturbance, psychosis noted in [facility] records as far back as 2013, not on meds for the orthostasis outside of sodium tablets; also has had hyponatremia since around 2013 that's been mild -[facility] Elton Sin at [facility] did not think he had MSA but that his orthostasis may have been due to his acute illness at the time  2. Partial symptomatic epilepsy with complex partial seizures, intractable, with status  epilepticus ([place]) -ongoing issue, on multiple medications as above -is now following with Dr. Jannifer Franklin locally -pt's dilantin level is different than norm (above)  3. Chronic atrial fibrillation -cont flecainide, warfarin for INR goal 2-3  4. Abnormality of gait -encourage walker use for stability and fall prevention  5. Refractory epilepsy ([place]) -as above, has undergone multiple meds, frontal lobe partial resection, has vagal n stimulator and 4 different antiepileptic meds, still has seizures  6. ICD (implantable cardioverter-defibrillator) in place -due to prior cardiac arrest  7. Status post VNS (vagus nerve stimulator) placement -for refractory seizures; wand he carries should be waved over his right upper chest where this is implanted in the event of a witnessed seizure -Dr. Jannifer Franklin should be notified  8. Carcinoma in situ of prostate -by biopsy per pt's wife, being monitored for progression of symptoms. On finasteride  9. Osteopenia, unspecified location -and vitamin D deficiency, had been on high dose weekly D2 -should be maintained on some viatmin D--at least 2000 units D3 daily  10. Glaucomatous  optic atrophy of both eyes -with loss of vision left eye per records -cont lumigan drops  11. Idiopathic peripheral neuropathy -noted in records, not helpful with his balance and recurrent falling  12. Adjustment disorder with anxious mood -has struggled with his diagnoses and is now adjusting to new location, continues on xanax from the past for severe anxiety episodes  13. Vitamin D deficiency -will need to resume vitamin D given his longstanding seizure meds  14. Hyponatremia -cont sodium tablets and monitor--last month, Na was 138 at [facility] -has been chronic, but mild  15.  Grover's disease -cont current creams as above  Well over 75 mins were spent completing this visit to include time obtaining history from he and his wife as well as obtaining info thru careeverywhere  in more than one health system with multiple providers in patient with extensive complicated medical history.  Next visit:  05/27/18  Herley Bernardini L. Ebrima Ranta, D.O. [facility] [address], [place] [address] Cell Phone (Mon-Fri 8am-5pm):  [phone] On Call:  [phone] & follow prompts after 5pm & weekends Office Phone:  [phone] Office Fax:  [phone]

## 2018-03-04 NOTE — Procedures (Signed)
    History: Cameron Potts is an 82 year old gentleman with a history of intractable seizures, status post epilepsy surgery.  The patient has a vagal nerve stimulator in place, he has recently moved to this area, the vagal nerve stim later is being interrogated for documentation of current settings.   VAGAL NERVE STIMULATOR SETTINGS:  Output current: 1.250 milliamps  Signal frequency: 20 hz  Pulse width: 250 microseconds  On time: 14 seconds  Off time: 1.1 minutes  Magnet current: 1.5 milliamps  Magnet on time: 60 seconds  Pulse width: 250 microseconds  Autostim current: 1.250 milliamps  Autostim pulse width: 250 microseconds  Autostim on time: 60 seconds  Activations:    Output current: 1.250 milliamps  Lead impedance: 2940 ohms  IFI: No  The VNS unit was interrogated, and the settings were not changed.  Jill Alexanders MD 03/04/2018 12:57 PM  [facility] [address] [place], [place] [address]  Phone [phone] Fax [phone]

## 2018-03-05 DIAGNOSIS — G40909 Epilepsy, unspecified, not intractable, without status epilepticus: Secondary | ICD-10-CM | POA: Diagnosis not present

## 2018-03-05 DIAGNOSIS — F4322 Adjustment disorder with anxiety: Secondary | ICD-10-CM | POA: Diagnosis not present

## 2018-03-05 DIAGNOSIS — R261 Paralytic gait: Secondary | ICD-10-CM | POA: Diagnosis not present

## 2018-03-05 DIAGNOSIS — I4891 Unspecified atrial fibrillation: Secondary | ICD-10-CM | POA: Diagnosis not present

## 2018-03-05 DIAGNOSIS — E871 Hypo-osmolality and hyponatremia: Secondary | ICD-10-CM | POA: Diagnosis not present

## 2018-03-05 DIAGNOSIS — R278 Other lack of coordination: Secondary | ICD-10-CM | POA: Diagnosis not present

## 2018-03-06 LAB — COMPREHENSIVE METABOLIC PANEL
ALBUMIN: 4.6 g/dL (ref 3.5–4.7)
ALK PHOS: 128 IU/L — AB (ref 39–117)
ALT: 25 IU/L (ref 0–44)
AST: 29 IU/L (ref 0–40)
Albumin/Globulin Ratio: 1.7 (ref 1.2–2.2)
BUN/Creatinine Ratio: 20 (ref 10–24)
BUN: 17 mg/dL (ref 8–27)
Bilirubin Total: 0.2 mg/dL (ref 0.0–1.2)
CO2: 25 mmol/L (ref 20–29)
Calcium: 9 mg/dL (ref 8.6–10.2)
Chloride: 102 mmol/L (ref 96–106)
Creatinine, Ser: 0.86 mg/dL (ref 0.76–1.27)
GFR calc Af Amer: 95 mL/min/{1.73_m2} (ref >59)
GFR calc non Af Amer: 82 mL/min/{1.73_m2} (ref >59)
GLUCOSE: 87 mg/dL (ref 65–99)
Globulin, Total: 2.7 g/dL (ref 1.5–4.5)
Potassium: 4.9 mmol/L (ref 3.5–5.2)
SODIUM: 142 mmol/L (ref 134–144)
Total Protein: 7.3 g/dL (ref 6.0–8.5)

## 2018-03-06 LAB — CBC WITH DIFFERENTIAL/PLATELET
BASOS: 1 %
Basophils Absolute: 0.1 10*3/uL (ref 0.0–0.2)
EOS (ABSOLUTE): 0.4 10*3/uL (ref 0.0–0.4)
Eos: 7 %
Hematocrit: 35.6 % — ABNORMAL LOW (ref 37.5–51.0)
Hemoglobin: 12.3 g/dL — ABNORMAL LOW (ref 13.0–17.7)
IMMATURE GRANULOCYTES: 1 %
Immature Grans (Abs): 0 10*3/uL (ref 0.0–0.1)
Lymphocytes Absolute: 1.5 10*3/uL (ref 0.7–3.1)
Lymphs: 25 %
MCH: 31.7 pg (ref 26.6–33.0)
MCHC: 34.6 g/dL (ref 31.5–35.7)
MCV: 92 fL (ref 79–97)
MONOS ABS: 0.6 10*3/uL (ref 0.1–0.9)
Monocytes: 10 %
NEUTROS PCT: 56 %
Neutrophils Absolute: 3.6 10*3/uL (ref 1.4–7.0)
PLATELETS: 217 10*3/uL (ref 150–450)
RBC: 3.88 x10E6/uL — AB (ref 4.14–5.80)
RDW: 13.5 % (ref 12.3–15.4)
WBC: 6.2 10*3/uL (ref 3.4–10.8)

## 2018-03-06 LAB — LEVETIRACETAM LEVEL: Levetiracetam Lvl: 43.8 ug/mL — ABNORMAL HIGH (ref 10.0–40.0)

## 2018-03-06 LAB — PHENYTOIN LEVEL, TOTAL: Phenytoin (Dilantin), Serum: 15.3 ug/mL (ref 10.0–20.0)

## 2018-03-07 DIAGNOSIS — R569 Unspecified convulsions: Secondary | ICD-10-CM | POA: Diagnosis not present

## 2018-03-07 DIAGNOSIS — S0181XA Laceration without foreign body of other part of head, initial encounter: Secondary | ICD-10-CM | POA: Diagnosis not present

## 2018-03-07 DIAGNOSIS — I10 Essential (primary) hypertension: Secondary | ICD-10-CM | POA: Diagnosis not present

## 2018-03-07 DIAGNOSIS — G40909 Epilepsy, unspecified, not intractable, without status epilepticus: Secondary | ICD-10-CM | POA: Diagnosis not present

## 2018-03-07 DIAGNOSIS — W19XXXA Unspecified fall, initial encounter: Secondary | ICD-10-CM | POA: Diagnosis not present

## 2018-03-07 DIAGNOSIS — S0990XA Unspecified injury of head, initial encounter: Secondary | ICD-10-CM | POA: Diagnosis not present

## 2018-03-08 ENCOUNTER — Emergency Department (HOSPITAL_COMMUNITY)
Admission: EM | Admit: 2018-03-08 | Discharge: 2018-03-08 | Disposition: A | Discharge status: Home / Self Care | Payer: Medicare Other | Attending: Emergency Medicine | Admitting: Emergency Medicine

## 2018-03-08 ENCOUNTER — Encounter (HOSPITAL_COMMUNITY): Payer: Self-pay

## 2018-03-08 ENCOUNTER — Other Ambulatory Visit: Payer: Self-pay

## 2018-03-08 ENCOUNTER — Emergency Department (HOSPITAL_COMMUNITY): Payer: Medicare Other

## 2018-03-08 DIAGNOSIS — R569 Unspecified convulsions: Secondary | ICD-10-CM | POA: Insufficient documentation

## 2018-03-08 DIAGNOSIS — Y9389 Activity, other specified: Secondary | ICD-10-CM | POA: Diagnosis not present

## 2018-03-08 DIAGNOSIS — W010XXA Fall on same level from slipping, tripping and stumbling without subsequent striking against object, initial encounter: Secondary | ICD-10-CM | POA: Diagnosis not present

## 2018-03-08 DIAGNOSIS — S199XXA Unspecified injury of neck, initial encounter: Secondary | ICD-10-CM | POA: Diagnosis not present

## 2018-03-08 DIAGNOSIS — Y999 Unspecified external cause status: Secondary | ICD-10-CM | POA: Insufficient documentation

## 2018-03-08 DIAGNOSIS — Z7901 Long term (current) use of anticoagulants: Secondary | ICD-10-CM | POA: Diagnosis not present

## 2018-03-08 DIAGNOSIS — S0181XA Laceration without foreign body of other part of head, initial encounter: Secondary | ICD-10-CM | POA: Insufficient documentation

## 2018-03-08 DIAGNOSIS — G40909 Epilepsy, unspecified, not intractable, without status epilepticus: Secondary | ICD-10-CM | POA: Diagnosis not present

## 2018-03-08 DIAGNOSIS — Z79899 Other long term (current) drug therapy: Secondary | ICD-10-CM | POA: Diagnosis not present

## 2018-03-08 DIAGNOSIS — S0990XA Unspecified injury of head, initial encounter: Secondary | ICD-10-CM | POA: Diagnosis not present

## 2018-03-08 DIAGNOSIS — Y92129 Unspecified place in nursing home as the place of occurrence of the external cause: Secondary | ICD-10-CM | POA: Diagnosis not present

## 2018-03-08 DIAGNOSIS — W19XXXA Unspecified fall, initial encounter: Secondary | ICD-10-CM

## 2018-03-08 DIAGNOSIS — Z87891 Personal history of nicotine dependence: Secondary | ICD-10-CM | POA: Diagnosis not present

## 2018-03-08 LAB — PHENYTOIN LEVEL, TOTAL: Phenytoin Lvl: 11.8 ug/mL (ref 10.0–20.0)

## 2018-03-08 LAB — PROTIME-INR
INR: 1.93
PROTHROMBIN TIME: 21.9 s — AB (ref 11.4–15.2)

## 2018-03-08 MED ORDER — LIDOCAINE-EPINEPHRINE (PF) 2 %-1:200000 IJ SOLN
10.0000 mL | Freq: Once | INTRAMUSCULAR | Status: AC
  Administered 2018-03-08: 10 mL
  Filled 2018-03-08: qty 20

## 2018-03-08 MED ORDER — PHENYTOIN SODIUM EXTENDED 100 MG PO CAPS
100.0000 mg | ORAL_CAPSULE | Freq: Once | ORAL | Status: AC
  Administered 2018-03-08: 100 mg via ORAL
  Filled 2018-03-08: qty 1

## 2018-03-08 NOTE — ED Triage Notes (Signed)
Pt BIB [facility] for eval of fall w/ laceration on coumadin. Pt had seizure earlier today hx of same, took nap afterwards. Woke up and ambulated to BR and fell in BR. Unknown LOC. Pt is GCS15, A&Ox4 on arrival.

## 2018-03-08 NOTE — ED Notes (Signed)
PA at the bedside suturing.

## 2018-03-08 NOTE — ED Provider Notes (Signed)
..  Laceration Repair Date/Time: 03/08/2018 2:46 AM Performed by: Abigail Butts, PA-C Authorized by: Abigail Butts, PA-C   Consent:    Consent obtained:  Verbal   Consent given by:  Spouse   Risks discussed:  Infection, pain, poor cosmetic result and poor wound healing   Alternatives discussed:  No treatment Anesthesia (see MAR for exact dosages):    Anesthesia method:  Local infiltration   Local anesthetic:  Lidocaine 2% WITH epi (67mL) Laceration details:    Location:  Face   Face location:  Forehead   Length (cm):  3 Repair type:    Repair type:  Intermediate Pre-procedure details:    Preparation:  Patient was prepped and draped in usual sterile fashion Exploration:    Hemostasis achieved with:  Epinephrine and direct pressure   Wound exploration: entire depth of wound probed and visualized   Treatment:    Area cleansed with:  Shur-Clens   Amount of cleaning:  Standard   Irrigation method:  Syringe Skin repair:    Repair method:  Sutures   Suture size:  5-0   Suture material:  Chromic gut   Suture technique:  Simple interrupted   Number of sutures:  8 Approximation:    Approximation:  Close Post-procedure details:    Dressing:  Open (no dressing)   Patient tolerance of procedure:  Tolerated well, no immediate complications     Cameron Potts 16/10/96 0454    Delora Fuel, MD 09/81/19 2244

## 2018-03-08 NOTE — Discharge Instructions (Signed)
Sutures need to be removed in five days.  Call your neurologist to have him adjust the dose of your phenytoin (Dilantin).

## 2018-03-08 NOTE — ED Notes (Signed)
Seizure pads placed at this time

## 2018-03-08 NOTE — ED Provider Notes (Signed)
[facility] EMERGENCY DEPARTMENT Provider Note   CSN: [id number] Arrival date & time: 03/08/18  0000     History   Chief Complaint Chief Complaint  Patient presents with  . Fall    HPI Cameron Potts is a 82 y.o. male.  The history is provided by the patient.  He has history of atrial fibrillation anticoagulated on warfarin, hyperlipidemia, seizure disorder and comes in following a fall.  He states that he lost his balance when he was going to the bathroom and hit his head without loss of consciousness.  He suffered a laceration to his forehead.  Last tetanus immunization was 2 years ago.  He also states he had a had a seizure earlier today.  He states he has been compliant with his anticonvulsant regimen.  He denies bowel or bladder incontinence and denies bit lip or tongue.  Breakthrough seizures are not unusual for him.  Past Medical History:  Diagnosis Date  . Abnormality of gait 12/21/2013  . Atrial fibrillation ([place])   . Blind left eye   . Cardiac arrest ([place])   . Glaucoma   . Hypercholesteremia   . Hyperlipidemia   . Mild left ventricular systolic dysfunction   . PSA elevation   . Right frontal lobe lesion   . Seizures ([place])   . Urinary urgency     Patient Active Problem List   Diagnosis Date Noted  . Status epilepticus ([place]) 01/05/2017  . A-fib ([place]) 01/04/2017  . Abnormality of gait 12/21/2013  . Complex partial seizure ([place]) 04/11/2013    Past Surgical History:  Procedure Laterality Date  . CARDIAC DEFIBRILLATOR PLACEMENT    . cataract surgery    . COLONOSCOPY    . CRANIOTOMY Right    right anterior temporal resection  . defibrillator replaced  March 2016  . IMPLANTATION VAGAL NERVE STIMULATOR  June 2016  . NASAL SINUS SURGERY    . TONSILLECTOMY     age 30  . [place] Medications    Prior to Admission medications   Medication Sig Start Date End Date Taking? Authorizing Provider  ALPRAZolam (XANAX) 0.25 MG  tablet Take 0.25 mg by mouth 2 (two) times daily.  11/26/17   [provider]  bimatoprost (LUMIGAN) 0.01 % SOLN Apply to eye. 11/03/17   [provider]  brimonidine-timolol (COMBIGAN) 0.2-0.5 % ophthalmic solution INSTILL 1 DROP IN [facility] E[facility] EVERY 12 HOURS 01/05/18   [provider]  finasteride (PROSCAR) 5 MG tablet Take 5 mg by mouth daily.     [provider]  flecainide (TAMBOCOR) 50 MG tablet Take 50 mg by mouth 2 (two) times daily.    [provider]  fluocinonide ointment (LIDEX) 3.55 % Apply 1 application topically 2 (two) times daily as needed.    [provider]  lacosamide (VIMPAT) 200 MG TABS tablet Take 200 mg by mouth 2 (two) times daily.  09/30/17 10/01/18  [provider]  levETIRAcetam (KEPPRA) 750 MG tablet Take 1,500 mg by mouth 2 (two) times daily. 12/23/16   [provider]  LORazepam (ATIVAN) 2 MG tablet Take 2 mg by mouth daily as needed for seizure.    [provider]  phenytoin (DILANTIN) 100 MG ER capsule Take 190mg  (1 100mg  and 3 30mg ) in the morning and 200mg  (2 100mg ) in the evening. 01/22/18   [provider]  phenytoin (DILANTIN) 30 MG ER capsule PLEASE SEE ATTACHED FOR DETAILED  DIRECTIONS 01/22/18   [provider]  sodium chloride 1 g tablet Take 1 g by mouth daily.    [provider]  triamcinolone cream (KENALOG) 0.1 % Apply 1 application topically as needed.    [provider]  VITAMIN D, ERGOCALCIFEROL, PO Take 1 tablet by mouth daily.    [provider]  warfarin (COUMADIN) 2.5 MG tablet Take 1 tablet (2.5 mg) on Tues, Thurs, Sat, Sun, Take 1 and a half tablets ( equals 3.75 mg) on Mon, Wed, Fri. 01/27/18   [provider]    Family History Family History  Problem Relation Age of Onset  . Cancer - Lung Mother   . Cancer - Prostate Father     Social History Social History   Tobacco Use  . Smoking status: Former Smoker     Packs/day: 0.75    Years: 10.00    Pack years: 7.50    Types: Cigarettes  . Smokeless tobacco: Never Used  . Tobacco comment: quit 1960s  Substance Use Topics  . Alcohol use: No    Comment: former beer drinker  . Drug use: No     Allergies   Depakote [divalproex sodium]   Review of Systems Review of Systems  All other systems reviewed and are negative.    Physical Exam Updated Vital Signs BP (!) 159/60 (BP Location: Right Arm)   Pulse 60   Temp 97.9 F (36.6 C) (Oral)   Resp 12   Ht 5\' 9"  (1.753 m)   Wt 77.1 kg   SpO2 99%   BMI 25.10 kg/m   Physical Exam  Nursing note and vitals reviewed.  82 year old male, resting comfortably and in no acute distress. Vital signs are significant for elevated systolic blood pressure. Oxygen saturation is 99%, which is normal. Head is normocephalic.  Laceration present on the forehead oriented longitudinally. PERRLA, EOMI. Oropharynx is clear. Neck is nontender without adenopathy or JVD. Back is nontender and there is no CVA tenderness. Lungs are clear without rales, wheezes, or rhonchi. Chest is nontender. Heart has regular rate and rhythm without murmur. Abdomen is soft, flat, nontender without masses or hepatosplenomegaly and peristalsis is normoactive. Extremities have no cyanosis or edema, full range of motion is present. Skin is warm and dry without rash. Neurologic: Mental status is normal, cranial nerves are intact, there are no motor or sensory deficits.  Somewhat flat affect is noted.  ED Treatments / Results  Labs (all labs ordered are listed, but only abnormal results are displayed) Labs Reviewed  PROTIME-INR - Abnormal; Notable for the following components:      Result Value   Prothrombin Time 21.9 (*)    All other components within normal limits  PHENYTOIN LEVEL, TOTAL   Radiology Ct Head Wo Contrast  Result Date: 03/08/2018 CLINICAL DATA:  Maxillofacial blunt trauma EXAM: CT HEAD WITHOUT CONTRAST CT  CERVICAL SPINE WITHOUT CONTRAST TECHNIQUE: Multidetector CT imaging of the head and cervical spine was performed following the standard protocol without intravenous contrast. Multiplanar CT image reconstructions of the cervical spine were also generated. COMPARISON:  Head CT 03/31/2005 FINDINGS: CT HEAD FINDINGS Brain: Encephalomalacia involving the right temporal lobe with overlying craniotomy change of the right temporoparietal skull. Ventriculomegaly is identified with mild ex vacuo dilatation secondary to right temporal lobe encephalomalacia. Mild-to-moderate small vessel ischemic disease of periventricular subcortical white matter extending into the centrum semiovale on the right. No intra-axial mass nor extra-axial fluid collections. Minimal small vessel ischemic changes of the  right pons. No intra-axial mass nor extra-axial fluid collections. Vascular: No hyperdense vessel sign. Skull: Craniotomy changes of the right temporal and parietal skull. No acute osseous abnormality. Sinuses/Orbits: Mild-to-moderate paranasal sinus mucosal thickening. No air-fluid levels. Bilateral lens replacements. Other: Right frontoparietal scalp contusion and laceration. CT CERVICAL SPINE FINDINGS Alignment: The patient's head is tilted to the left. Maintained cervical lordosis is identified. The craniocervical relationship is intact. Skull base and vertebrae: No acute fracture of the skull base or cervical spine. Soft tissues and spinal canal: No prevertebral soft tissue swelling. No visible canal hematoma. Disc levels: Moderate-to-marked disc flattening C3-4, C4-5 and to a greater extent C5-6 and C6-7. Mild disc flattening noted at C2-3 and along the upper included thoracic spine to T3. Uncovertebral joint osteoarthritis is noted bilaterally at C3-4, the right at C4-5 and bilaterally at C5-6 and C6-7. These contribute to mild bilateral foraminal encroachment. Degenerative cystic change noted of C6 with probable small vertebral  hemangioma. Upper chest: Clear lung apices.  No pneumothorax. Other: Extracranial carotid arteriosclerosis bilaterally. Pacemaker generator projects over the left anterior chest wall and is partially imaged. IMPRESSION: 1. Encephalomalacia of the right temporal lobe with overlying craniotomy change of the skull. No acute intracranial abnormality. 2. Likely chronic small vessel ischemic disease of periventricular white matter. 3. Cervical spondylosis without acute cervical spine fracture or static listhesis. Electronically Signed   By: Ashley Royalty M.D.   On: 03/08/2018 01:17   Ct Cervical Spine Wo Contrast  Result Date: 03/08/2018 CLINICAL DATA:  Maxillofacial blunt trauma EXAM: CT HEAD WITHOUT CONTRAST CT CERVICAL SPINE WITHOUT CONTRAST TECHNIQUE: Multidetector CT imaging of the head and cervical spine was performed following the standard protocol without intravenous contrast. Multiplanar CT image reconstructions of the cervical spine were also generated. COMPARISON:  Head CT 03/31/2005 FINDINGS: CT HEAD FINDINGS Brain: Encephalomalacia involving the right temporal lobe with overlying craniotomy change of the right temporoparietal skull. Ventriculomegaly is identified with mild ex vacuo dilatation secondary to right temporal lobe encephalomalacia. Mild-to-moderate small vessel ischemic disease of periventricular subcortical white matter extending into the centrum semiovale on the right. No intra-axial mass nor extra-axial fluid collections. Minimal small vessel ischemic changes of the right pons. No intra-axial mass nor extra-axial fluid collections. Vascular: No hyperdense vessel sign. Skull: Craniotomy changes of the right temporal and parietal skull. No acute osseous abnormality. Sinuses/Orbits: Mild-to-moderate paranasal sinus mucosal thickening. No air-fluid levels. Bilateral lens replacements. Other: Right frontoparietal scalp contusion and laceration. CT CERVICAL SPINE FINDINGS Alignment: The patient's  head is tilted to the left. Maintained cervical lordosis is identified. The craniocervical relationship is intact. Skull base and vertebrae: No acute fracture of the skull base or cervical spine. Soft tissues and spinal canal: No prevertebral soft tissue swelling. No visible canal hematoma. Disc levels: Moderate-to-marked disc flattening C3-4, C4-5 and to a greater extent C5-6 and C6-7. Mild disc flattening noted at C2-3 and along the upper included thoracic spine to T3. Uncovertebral joint osteoarthritis is noted bilaterally at C3-4, the right at C4-5 and bilaterally at C5-6 and C6-7. These contribute to mild bilateral foraminal encroachment. Degenerative cystic change noted of C6 with probable small vertebral hemangioma. Upper chest: Clear lung apices.  No pneumothorax. Other: Extracranial carotid arteriosclerosis bilaterally. Pacemaker generator projects over the left anterior chest wall and is partially imaged. IMPRESSION: 1. Encephalomalacia of the right temporal lobe with overlying craniotomy change of the skull. No acute intracranial abnormality. 2. Likely chronic small vessel ischemic disease of periventricular white matter. 3. Cervical spondylosis without acute  cervical spine fracture or static listhesis. Electronically Signed   By: Ashley Royalty M.D.   On: 03/08/2018 01:17    Procedures Procedures   Medications Ordered in ED Medications  lidocaine-EPINEPHrine (XYLOCAINE W/EPI) 2 %-1:200000 (PF) injection 10 mL (10 mLs Infiltration Given 03/08/18 0154)  phenytoin (DILANTIN) ER capsule 100 mg (100 mg Oral Given 03/08/18 0201)     Initial Impression / Assessment and Plan / ED Course  I have reviewed the triage vital signs and the nursing notes.  Pertinent labs & imaging results that were available during my care of the patient were reviewed by me and considered in my medical decision making (see chart for details).  Fall with forehead laceration.  Inpatient anticoagulated on warfarin, need to do  CT of head.  Because of breakthrough seizure, will check phenytoin level and will also check INR.  Old records are reviewed, and he has had several admissions for intractable epilepsy.  Last INR was on August 16 and was 2.5 (seen on Care Everywhere).  Tdap had been given in 2017, so does not need to be updated.  He will need sutures to repair his laceration.  CT scan showed no acute injury.  INR has come back borderline subtherapeutic.  Phenytoin level is technically therapeutic, but notes from his neurologist states that he wants a level between 18 and 20.  Level is 11.8, so he is given an extra dose of oral phenytoin.  He will need to follow-up with his neurologist to adjust phenytoin dose.  Laceration is closed by [facility] PA-C.  Final Clinical Impressions(s) / ED Diagnoses   Final diagnoses:  Fall at nursing home, initial encounter  Forehead laceration, initial encounter  Anticoagulated on warfarin  Seizure [facility])    ED Discharge Orders    None       Delora Fuel, MD 98/42/10 2245

## 2018-03-08 NOTE — ED Notes (Signed)
Patient transported to CT 

## 2018-03-09 ENCOUNTER — Telehealth: Payer: Self-pay | Admitting: Neurology

## 2018-03-09 ENCOUNTER — Encounter: Payer: Self-pay | Admitting: Internal Medicine

## 2018-03-09 MED ORDER — PHENYTOIN SODIUM EXTENDED 100 MG PO CAPS: 200.0000 mg | ORAL_CAPSULE | Freq: Two times a day (BID) | ORAL | 0 refills | Status: DC

## 2018-03-09 NOTE — Telephone Encounter (Signed)
I called [facility].  The patient had a seizure over the weekend, we will increase the Dilantin to a 200 mg twice daily regimen.  Recheck blood work in 2 weeks.

## 2018-03-09 NOTE — Telephone Encounter (Signed)
Sherry/[facility] [phone] pt had seizure on Saturday fell while in the bathroom hit his head. He was taken to ED, pt needed sutures. Labs were taken and dilantin levels were low. He was given 100mg  extra per his wife. Current; dose is 190mg  am and 200mg  evening. Please call to advise if dosing needs to be increased.

## 2018-03-09 NOTE — Telephone Encounter (Signed)
error 

## 2018-03-09 NOTE — Telephone Encounter (Signed)
Sherry with [facility] requesting a call back needing to know if they can give the pt 30MG  this morning to get him to the new dose of 200MG  please advise

## 2018-03-09 NOTE — Telephone Encounter (Signed)
Called and spoke with Judeen Hammans. Relayed per Dr. Jannifer Franklin that is was okay to give extra 30mg  this morning. She verbalized understanding and appreciation.

## 2018-03-10 DIAGNOSIS — M79671 Pain in right foot: Secondary | ICD-10-CM | POA: Diagnosis not present

## 2018-03-10 DIAGNOSIS — R278 Other lack of coordination: Secondary | ICD-10-CM | POA: Diagnosis not present

## 2018-03-10 DIAGNOSIS — B353 Tinea pedis: Secondary | ICD-10-CM | POA: Diagnosis not present

## 2018-03-10 DIAGNOSIS — E871 Hypo-osmolality and hyponatremia: Secondary | ICD-10-CM | POA: Diagnosis not present

## 2018-03-10 DIAGNOSIS — G40909 Epilepsy, unspecified, not intractable, without status epilepticus: Secondary | ICD-10-CM | POA: Diagnosis not present

## 2018-03-10 DIAGNOSIS — I4891 Unspecified atrial fibrillation: Secondary | ICD-10-CM | POA: Diagnosis not present

## 2018-03-10 DIAGNOSIS — R261 Paralytic gait: Secondary | ICD-10-CM | POA: Diagnosis not present

## 2018-03-10 DIAGNOSIS — B351 Tinea unguium: Secondary | ICD-10-CM | POA: Diagnosis not present

## 2018-03-10 DIAGNOSIS — M79672 Pain in left foot: Secondary | ICD-10-CM | POA: Diagnosis not present

## 2018-03-10 DIAGNOSIS — Q6689 Other  specified congenital deformities of feet: Secondary | ICD-10-CM | POA: Diagnosis not present

## 2018-03-10 DIAGNOSIS — F4322 Adjustment disorder with anxiety: Secondary | ICD-10-CM | POA: Diagnosis not present

## 2018-03-11 ENCOUNTER — Encounter: Payer: Self-pay | Admitting: Internal Medicine

## 2018-03-11 DIAGNOSIS — R261 Paralytic gait: Secondary | ICD-10-CM | POA: Diagnosis not present

## 2018-03-11 DIAGNOSIS — G40909 Epilepsy, unspecified, not intractable, without status epilepticus: Secondary | ICD-10-CM | POA: Diagnosis not present

## 2018-03-11 DIAGNOSIS — E871 Hypo-osmolality and hyponatremia: Secondary | ICD-10-CM | POA: Diagnosis not present

## 2018-03-11 DIAGNOSIS — R278 Other lack of coordination: Secondary | ICD-10-CM | POA: Diagnosis not present

## 2018-03-11 DIAGNOSIS — F4322 Adjustment disorder with anxiety: Secondary | ICD-10-CM | POA: Diagnosis not present

## 2018-03-11 DIAGNOSIS — I4891 Unspecified atrial fibrillation: Secondary | ICD-10-CM | POA: Diagnosis not present

## 2018-03-11 LAB — POCT INR: INR: 1.6 — AB (ref <=1.1)

## 2018-03-11 NOTE — Progress Notes (Signed)
Pt's INR 1.6 today. It was 1.9 in the ED after his fall and seizure Change dose for next few days as follows: Warfarin 3.75mg  today 9/25, tomorrow 9/26, and Friday 9/27, then return to usual dosing of:  2.5mg  sat and sun, 3.75mg  mon, 2.5mg  tues, 3.75mg  wed, 2.5mg  thurs, 3.75mg  Fri Recheck INR in 2 weeks in [facility] 03/25/18  Celine Dishman L. Kallum Jorgensen, D.O. [facility] [address], [place] [address] Cell Phone (Mon-Fri 8am-5pm):  [phone] On Call:  [phone] & follow prompts after 5pm & weekends Office Phone:  [phone] Office Fax:  [phone]

## 2018-03-13 ENCOUNTER — Encounter: Payer: Self-pay | Admitting: Internal Medicine

## 2018-03-13 DIAGNOSIS — G20C Parkinsonism, unspecified: Secondary | ICD-10-CM | POA: Insufficient documentation

## 2018-03-13 DIAGNOSIS — G40919 Epilepsy, unspecified, intractable, without status epilepticus: Secondary | ICD-10-CM | POA: Insufficient documentation

## 2018-03-13 DIAGNOSIS — G20 Parkinson's disease: Secondary | ICD-10-CM | POA: Insufficient documentation

## 2018-03-13 DIAGNOSIS — R591 Generalized enlarged lymph nodes: Secondary | ICD-10-CM | POA: Insufficient documentation

## 2018-03-13 DIAGNOSIS — I951 Orthostatic hypotension: Secondary | ICD-10-CM | POA: Insufficient documentation

## 2018-03-13 DIAGNOSIS — Z95810 Presence of automatic (implantable) cardiac defibrillator: Secondary | ICD-10-CM | POA: Insufficient documentation

## 2018-03-13 DIAGNOSIS — Z9689 Presence of other specified functional implants: Secondary | ICD-10-CM | POA: Insufficient documentation

## 2018-03-13 DIAGNOSIS — G903 Multi-system degeneration of the autonomic nervous system: Principal | ICD-10-CM

## 2018-03-13 DIAGNOSIS — L111 Transient acantholytic dermatosis [Grover]: Secondary | ICD-10-CM | POA: Insufficient documentation

## 2018-03-16 DIAGNOSIS — G40911 Epilepsy, unspecified, intractable, with status epilepticus: Secondary | ICD-10-CM | POA: Diagnosis not present

## 2018-03-16 DIAGNOSIS — R2689 Other abnormalities of gait and mobility: Secondary | ICD-10-CM | POA: Diagnosis not present

## 2018-03-16 DIAGNOSIS — Z9181 History of falling: Secondary | ICD-10-CM | POA: Diagnosis not present

## 2018-03-16 DIAGNOSIS — E871 Hypo-osmolality and hyponatremia: Secondary | ICD-10-CM | POA: Diagnosis not present

## 2018-03-17 DIAGNOSIS — H401133 Primary open-angle glaucoma, bilateral, severe stage: Secondary | ICD-10-CM | POA: Diagnosis not present

## 2018-03-17 DIAGNOSIS — Z961 Presence of intraocular lens: Secondary | ICD-10-CM | POA: Diagnosis not present

## 2018-03-24 ENCOUNTER — Telehealth: Payer: Self-pay | Admitting: *Deleted

## 2018-03-24 DIAGNOSIS — G4089 Other seizures: Secondary | ICD-10-CM | POA: Diagnosis not present

## 2018-03-24 DIAGNOSIS — G40911 Epilepsy, unspecified, intractable, with status epilepticus: Secondary | ICD-10-CM | POA: Diagnosis not present

## 2018-03-24 DIAGNOSIS — R569 Unspecified convulsions: Secondary | ICD-10-CM | POA: Diagnosis not present

## 2018-03-24 NOTE — Telephone Encounter (Signed)
Patient called and stated that he had blood work done this morning at [facility] and he wants a Dilantin level added and checked. Please Advise.

## 2018-03-24 NOTE — Telephone Encounter (Signed)
Patient notified and agreed.  

## 2018-03-24 NOTE — Telephone Encounter (Signed)
I have notified the nurse manager for assisted living at [facility] that he needs a dilantin level added.  We will add on if possible.  In the future, he should ask the nurse on his unit.

## 2018-03-25 ENCOUNTER — Telehealth: Payer: Self-pay | Admitting: Neurology

## 2018-03-25 ENCOUNTER — Encounter: Payer: Self-pay | Admitting: Internal Medicine

## 2018-03-25 ENCOUNTER — Non-Acute Institutional Stay: Payer: Medicare Other | Admitting: Internal Medicine

## 2018-03-25 VITALS — BP 122/62 | HR 72 | Temp 97.5°F | Ht 68.0 in | Wt 169.0 lb

## 2018-03-25 DIAGNOSIS — I482 Chronic atrial fibrillation, unspecified: Secondary | ICD-10-CM

## 2018-03-25 DIAGNOSIS — I4891 Unspecified atrial fibrillation: Secondary | ICD-10-CM

## 2018-03-25 DIAGNOSIS — D6869 Other thrombophilia: Secondary | ICD-10-CM | POA: Diagnosis not present

## 2018-03-25 DIAGNOSIS — G40919 Epilepsy, unspecified, intractable, without status epilepticus: Secondary | ICD-10-CM | POA: Diagnosis not present

## 2018-03-25 LAB — POCT INR: INR: 1.6 — AB (ref 2.0–3.0)

## 2018-03-25 MED ORDER — PHENYTOIN 50 MG PO CHEW: 25.0000 mg | CHEWABLE_TABLET | Freq: Every day | ORAL | 1 refills | Status: DC

## 2018-03-25 NOTE — Progress Notes (Signed)
Location:  Occupational psychologist of Service:  Clinic (12)  Provider: Eliyana Pagliaro L. Mariea Clonts, D.O., C.M.D.  Code Status: DNR Goals of Care:  Advanced Directives 03/08/2018  Does Patient Have a Medical Advance Directive? Yes  Type of Paramedic of [place];Out of facility DNR (pink MOST or yellow form)  Does patient want to make changes to medical advance directive? No - Patient declined  Copy of [place] in Chart? Yes  Pre-existing out of facility DNR order (yellow form or pink MOST form) Yellow form placed in chart (order not valid for inpatient use)     Chief Complaint  Patient presents with  . Anticoagulation    PT/INR    HPI: Patient is a 82 y.o. male with refractory epilepsy with vagal stimulator, Parkinsonian syndrome with neurogenic orthostatic hypotension, chronic afib, ICD seen today for INR check.  He's on coumadin for afib.  He also takes dilantin for his seizure disorder.  INR today was 1.6 with 2.5mg  on tues/thurs/sat/sun and 3.75mg  on mon and wed.  His wife reports he's been eating VERY large salads. He and his wife would like for [place] to recheck his level next week while he decreases his size of salads rather than immediately changing his warfarin due to then needing to change dilantin.   They are curious about his dilantin level which we added to the labs he had drawn on Tuesday.  Dilantin level was 12.5.  Goal is 18-20 so this is very low.  He's currently on 200mg  po bid since his last level of 15 with Dr. Jannifer Franklin.  He's had no further seizures since 9/22 when he also fell and required sutures to his right forehead.    Past Medical History:  Diagnosis Date  . Abnormality of gait 12/21/2013  . Atrial fibrillation ([place])   . Blind left eye   . Cardiac arrest ([place])   . Glaucoma   . Hypercholesteremia   . Hyperlipidemia   . Mild left ventricular systolic dysfunction   . PSA elevation   . Right frontal lobe lesion     . Seizures ([place])   . Urinary urgency     Past Surgical History:  Procedure Laterality Date  . CARDIAC DEFIBRILLATOR PLACEMENT    . cataract surgery    . COLONOSCOPY    . CRANIOTOMY Right    right anterior temporal resection  . defibrillator replaced  March 2016  . IMPLANTATION VAGAL NERVE STIMULATOR  June 2016  . NASAL SINUS SURGERY    . TONSILLECTOMY     age 34  . VASECTOMY  1985    Allergies  Allergen Reactions  . Depakote [Divalproex Sodium] Other (See Comments)    Arthralgias     Outpatient Encounter Medications as of 03/25/2018  Medication Sig  . ALPRAZolam (XANAX) 0.25 MG tablet Take 0.25 mg by mouth 2 (two) times daily.   . bimatoprost (LUMIGAN) 0.01 % SOLN Place 1 drop into both eyes at bedtime.   . brimonidine-timolol (COMBIGAN) 0.2-0.5 % ophthalmic solution Place 1 drop into both eyes every 12 (twelve) hours.   . cholecalciferol (VITAMIN D) 400 units TABS tablet Take 400 Units by mouth 2 (two) times daily.  . finasteride (PROSCAR) 5 MG tablet Take 5 mg by mouth daily.   . flecainide (TAMBOCOR) 50 MG tablet Take 50 mg by mouth 2 (two) times daily.  . fluocinonide ointment (LIDEX) 2.12 % Apply 1 application topically 2 (two) times daily as needed (to  face).   . lacosamide (VIMPAT) 200 MG TABS tablet Take 200 mg by mouth 2 (two) times daily.   Marland Kitchen levETIRAcetam (KEPPRA) 750 MG tablet Take 1,500 mg by mouth 2 (two) times daily.  Marland Kitchen LORazepam (ATIVAN) 2 MG tablet Take 2 mg by mouth daily as needed for seizure.  . phenytoin (DILANTIN) 100 MG ER capsule Take 2 capsules (200 mg total) by mouth 2 (two) times daily.  . sodium chloride 1 g tablet Take 1 g by mouth daily.  Marland Kitchen triamcinolone cream (KENALOG) 0.1 % Apply 1 application topically 2 (two) times daily as needed (for flares).   . warfarin (COUMADIN) 2.5 MG tablet Take 1 tablet (2.5 mg) on Tues, Thurs, Sat, Sun, Take 1 and a half tablets ( equals 3.75 mg) on Mon, Wed, Fri.   No facility-administered encounter medications  on file as of 03/25/2018.     Review of Systems:  Review of Systems  Constitutional: Negative for chills and fever.  Eyes: Negative for blurred vision.  Respiratory: Negative for shortness of breath.   Cardiovascular: Negative for palpitations and leg swelling.  Musculoskeletal: Positive for falls.  Skin:       New small raised papule on nose  Neurological: Positive for tremors.       No seizures since 9/22  Endo/Heme/Allergies: Bruises/bleeds easily.  Psychiatric/Behavioral: Positive for memory loss.    Health Maintenance  Topic Date Due  . INFLUENZA VACCINE  01/15/2018  . TETANUS/TDAP  12/06/2025  . PNA vac Low Risk Adult  Completed    Physical Exam: Vitals:   03/25/18 1129  BP: 122/62  Pulse: 72  Temp: (!) 97.5 F (36.4 C)  TempSrc: Oral  SpO2: 99%  Weight: 169 lb (76.7 kg)  Height: 5\' 8"  (1.727 m)   Body mass index is 25.7 kg/m. Physical Exam  Constitutional: He appears well-developed. No distress.  Cardiovascular: Intact distal pulses.  irreg irreg  Pulmonary/Chest: Effort normal and breath sounds normal. No respiratory distress.  Musculoskeletal:  Ambulates with cane at his wife's insistence, but literally carries it instead of using it, unsteady  Neurological: He is alert.  Skin: Skin is warm and dry.  Tiny papule on nose--skin tone, multiple excoriations of hands and arms, ecchymoses  Psychiatric:  Oscillating mood--he and his wife are debating about his coumadin and his dilantin doses and management    Labs reviewed: Basic Metabolic Panel: Recent Labs    03/04/18 1311  NA 142  K 4.9  CL 102  CO2 25  GLUCOSE 87  BUN 17  CREATININE 0.86  CALCIUM 9.0   Liver Function Tests: Recent Labs    03/04/18 1311  AST 29  ALT 25  ALKPHOS 128*  BILITOT 0.2  PROT 7.3  ALBUMIN 4.6   No results for input(s): LIPASE, AMYLASE in the last 8760 hours. No results for input(s): AMMONIA in the last 8760 hours. CBC: Recent Labs    03/04/18 1311  WBC  6.2  NEUTROABS 3.6  HGB 12.3*  HCT 35.6*  MCV 92  PLT 217   Lipid Panel: No results for input(s): CHOL, HDL, LDLCALC, TRIG, CHOLHDL, LDLDIRECT in the last 8760 hours. No results found for: HGBA1C  Procedures since last visit: Ct Head Wo Contrast  Result Date: 03/08/2018 CLINICAL DATA:  Maxillofacial blunt trauma EXAM: CT HEAD WITHOUT CONTRAST CT CERVICAL SPINE WITHOUT CONTRAST TECHNIQUE: Multidetector CT imaging of the head and cervical spine was performed following the standard protocol without intravenous contrast. Multiplanar CT image reconstructions of the cervical  spine were also generated. COMPARISON:  Head CT 03/31/2005 FINDINGS: CT HEAD FINDINGS Brain: Encephalomalacia involving the right temporal lobe with overlying craniotomy change of the right temporoparietal skull. Ventriculomegaly is identified with mild ex vacuo dilatation secondary to right temporal lobe encephalomalacia. Mild-to-moderate small vessel ischemic disease of periventricular subcortical white matter extending into the centrum semiovale on the right. No intra-axial mass nor extra-axial fluid collections. Minimal small vessel ischemic changes of the right pons. No intra-axial mass nor extra-axial fluid collections. Vascular: No hyperdense vessel sign. Skull: Craniotomy changes of the right temporal and parietal skull. No acute osseous abnormality. Sinuses/Orbits: Mild-to-moderate paranasal sinus mucosal thickening. No air-fluid levels. Bilateral lens replacements. Other: Right frontoparietal scalp contusion and laceration. CT CERVICAL SPINE FINDINGS Alignment: The patient's head is tilted to the left. Maintained cervical lordosis is identified. The craniocervical relationship is intact. Skull base and vertebrae: No acute fracture of the skull base or cervical spine. Soft tissues and spinal canal: No prevertebral soft tissue swelling. No visible canal hematoma. Disc levels: Moderate-to-marked disc flattening C3-4, C4-5 and to a  greater extent C5-6 and C6-7. Mild disc flattening noted at C2-3 and along the upper included thoracic spine to T3. Uncovertebral joint osteoarthritis is noted bilaterally at C3-4, the right at C4-5 and bilaterally at C5-6 and C6-7. These contribute to mild bilateral foraminal encroachment. Degenerative cystic change noted of C6 with probable small vertebral hemangioma. Upper chest: Clear lung apices.  No pneumothorax. Other: Extracranial carotid arteriosclerosis bilaterally. Pacemaker generator projects over the left anterior chest wall and is partially imaged. IMPRESSION: 1. Encephalomalacia of the right temporal lobe with overlying craniotomy change of the skull. No acute intracranial abnormality. 2. Likely chronic small vessel ischemic disease of periventricular white matter. 3. Cervical spondylosis without acute cervical spine fracture or static listhesis. Electronically Signed   By: Ashley Royalty M.D.   On: 03/08/2018 01:17   Ct Cervical Spine Wo Contrast  Result Date: 03/08/2018 CLINICAL DATA:  Maxillofacial blunt trauma EXAM: CT HEAD WITHOUT CONTRAST CT CERVICAL SPINE WITHOUT CONTRAST TECHNIQUE: Multidetector CT imaging of the head and cervical spine was performed following the standard protocol without intravenous contrast. Multiplanar CT image reconstructions of the cervical spine were also generated. COMPARISON:  Head CT 03/31/2005 FINDINGS: CT HEAD FINDINGS Brain: Encephalomalacia involving the right temporal lobe with overlying craniotomy change of the right temporoparietal skull. Ventriculomegaly is identified with mild ex vacuo dilatation secondary to right temporal lobe encephalomalacia. Mild-to-moderate small vessel ischemic disease of periventricular subcortical white matter extending into the centrum semiovale on the right. No intra-axial mass nor extra-axial fluid collections. Minimal small vessel ischemic changes of the right pons. No intra-axial mass nor extra-axial fluid collections.  Vascular: No hyperdense vessel sign. Skull: Craniotomy changes of the right temporal and parietal skull. No acute osseous abnormality. Sinuses/Orbits: Mild-to-moderate paranasal sinus mucosal thickening. No air-fluid levels. Bilateral lens replacements. Other: Right frontoparietal scalp contusion and laceration. CT CERVICAL SPINE FINDINGS Alignment: The patient's head is tilted to the left. Maintained cervical lordosis is identified. The craniocervical relationship is intact. Skull base and vertebrae: No acute fracture of the skull base or cervical spine. Soft tissues and spinal canal: No prevertebral soft tissue swelling. No visible canal hematoma. Disc levels: Moderate-to-marked disc flattening C3-4, C4-5 and to a greater extent C5-6 and C6-7. Mild disc flattening noted at C2-3 and along the upper included thoracic spine to T3. Uncovertebral joint osteoarthritis is noted bilaterally at C3-4, the right at C4-5 and bilaterally at C5-6 and C6-7. These contribute to mild  bilateral foraminal encroachment. Degenerative cystic change noted of C6 with probable small vertebral hemangioma. Upper chest: Clear lung apices.  No pneumothorax. Other: Extracranial carotid arteriosclerosis bilaterally. Pacemaker generator projects over the left anterior chest wall and is partially imaged. IMPRESSION: 1. Encephalomalacia of the right temporal lobe with overlying craniotomy change of the skull. No acute intracranial abnormality. 2. Likely chronic small vessel ischemic disease of periventricular white matter. 3. Cervical spondylosis without acute cervical spine fracture or static listhesis. Electronically Signed   By: Ashley Royalty M.D.   On: 03/08/2018 01:17    Assessment/Plan 1. Chronic atrial fibrillation -INR low at 1.6 -cont same dose of coumadin due to not wanting to have to make more dilantin adjustments that already necessary -decrease salads to side salad size like he used to eat at home -recheck INR on Tuesday (CMA to  come to apt to check)  2. Hypercoagulability due to atrial fibrillation ([place]) - as above - POC INR  3.  Refractory epilepsy ([place]) -pt given stat dose of dilantin 100mg  extra around noon due to low dilantin level of 12.5 on labs yesterday -he is otherwise on dilantin 200mg  po bid  -message sent to Dr. Jannifer Franklin asking for dose adjustment recommendations  Labs/tests ordered:  Recheck INR in 6 days by CMA for [name] Next appt:  05/27/2018  Xanthe Couillard L. Lisbet Busker, D.O. [facility] [address], [place] [address] Cell Phone (Mon-Fri 8am-5pm):  [phone] On Call:  [phone] & follow prompts after 5pm & weekends Office Phone:  [phone] Office Fax:  [phone]

## 2018-03-25 NOTE — Telephone Encounter (Signed)
Spoke with Mr. Cameron Potts and explained that Dr. Jannifer Franklin would like him to continue Dilantin 200mg  bid, and add 50mg , 1/2 tab once daily.  Pt. verbalized understanding of same/fim

## 2018-03-25 NOTE — Telephone Encounter (Signed)
I called the patient.  The Dilantin level done recently was 12.5, will increase the dosing to add a 50 mg tablet, 1/2 tablet daily in addition to the 200 mg twice daily dosing.  Dilantin levels will be followed in the future.

## 2018-03-27 ENCOUNTER — Encounter (HOSPITAL_COMMUNITY): Payer: Self-pay | Admitting: Emergency Medicine

## 2018-03-27 ENCOUNTER — Emergency Department (HOSPITAL_COMMUNITY)
Admission: EM | Admit: 2018-03-27 | Discharge: 2018-03-27 | Disposition: A | Discharge status: Home / Self Care | Payer: Medicare Other | Attending: Emergency Medicine | Admitting: Emergency Medicine

## 2018-03-27 DIAGNOSIS — I4891 Unspecified atrial fibrillation: Secondary | ICD-10-CM | POA: Diagnosis not present

## 2018-03-27 DIAGNOSIS — Z87891 Personal history of nicotine dependence: Secondary | ICD-10-CM | POA: Diagnosis not present

## 2018-03-27 DIAGNOSIS — R569 Unspecified convulsions: Secondary | ICD-10-CM | POA: Diagnosis not present

## 2018-03-27 DIAGNOSIS — G40909 Epilepsy, unspecified, not intractable, without status epilepticus: Secondary | ICD-10-CM | POA: Diagnosis not present

## 2018-03-27 DIAGNOSIS — R4182 Altered mental status, unspecified: Secondary | ICD-10-CM | POA: Diagnosis not present

## 2018-03-27 DIAGNOSIS — Z79899 Other long term (current) drug therapy: Secondary | ICD-10-CM | POA: Diagnosis not present

## 2018-03-27 DIAGNOSIS — Z7901 Long term (current) use of anticoagulants: Secondary | ICD-10-CM | POA: Insufficient documentation

## 2018-03-27 DIAGNOSIS — G20 Parkinson's disease: Secondary | ICD-10-CM | POA: Insufficient documentation

## 2018-03-27 DIAGNOSIS — R404 Transient alteration of awareness: Secondary | ICD-10-CM | POA: Diagnosis not present

## 2018-03-27 LAB — BASIC METABOLIC PANEL
Anion gap: 7 (ref 5–15)
BUN: 12 mg/dL (ref 8–23)
CALCIUM: 8.9 mg/dL (ref 8.9–10.3)
CO2: 27 mmol/L (ref 22–32)
Chloride: 105 mmol/L (ref 98–111)
Creatinine, Ser: 0.89 mg/dL (ref 0.61–1.24)
GFR calc Af Amer: >60 mL/min (ref >60)
GFR calc non Af Amer: >60 mL/min (ref >60)
GLUCOSE: 94 mg/dL (ref 70–99)
POTASSIUM: 4.4 mmol/L (ref 3.5–5.1)
Sodium: 139 mmol/L (ref 135–145)

## 2018-03-27 LAB — CBC WITH DIFFERENTIAL/PLATELET
ABS IMMATURE GRANULOCYTES: 0.01 10*3/uL (ref 0.00–0.07)
Basophils Absolute: 0.1 10*3/uL (ref 0.0–0.1)
Basophils Relative: 1 %
EOS ABS: 0.4 10*3/uL (ref 0.0–0.5)
Eosinophils Relative: 9 %
HEMATOCRIT: 36.5 % — AB (ref 39.0–52.0)
Hemoglobin: 11.8 g/dL — ABNORMAL LOW (ref 13.0–17.0)
IMMATURE GRANULOCYTES: 0 %
LYMPHS ABS: 1.2 10*3/uL (ref 0.7–4.0)
Lymphocytes Relative: 25 %
MCH: 31.2 pg (ref 26.0–34.0)
MCHC: 32.3 g/dL (ref 30.0–36.0)
MCV: 96.6 fL (ref 80.0–100.0)
MONOS PCT: 11 %
Monocytes Absolute: 0.6 10*3/uL (ref 0.1–1.0)
NEUTROS ABS: 2.6 10*3/uL (ref 1.7–7.7)
NEUTROS PCT: 54 %
Platelets: 193 10*3/uL (ref 150–400)
RBC: 3.78 MIL/uL — ABNORMAL LOW (ref 4.22–5.81)
RDW: 13 % (ref 11.5–15.5)
WBC: 4.9 10*3/uL (ref 4.0–10.5)
nRBC: 0 % (ref 0.0–0.2)

## 2018-03-27 LAB — URINALYSIS, ROUTINE W REFLEX MICROSCOPIC
Bilirubin Urine: NEGATIVE
Glucose, UA: NEGATIVE mg/dL
Hgb urine dipstick: NEGATIVE
Ketones, ur: NEGATIVE mg/dL
LEUKOCYTES UA: NEGATIVE
NITRITE: NEGATIVE
PROTEIN: NEGATIVE mg/dL
Specific Gravity, Urine: 1.016 (ref 1.005–1.030)
pH: 6 (ref 5.0–8.0)

## 2018-03-27 LAB — PHENYTOIN LEVEL, TOTAL: PHENYTOIN LVL: 13.2 ug/mL (ref 10.0–20.0)

## 2018-03-27 LAB — PROTIME-INR
INR: 1.82
Prothrombin Time: 20.9 seconds — ABNORMAL HIGH (ref 11.4–15.2)

## 2018-03-27 LAB — ALBUMIN: ALBUMIN: 3.8 g/dL (ref 3.5–5.0)

## 2018-03-27 LAB — CBG MONITORING, ED: GLUCOSE-CAPILLARY: 93 mg/dL (ref 70–99)

## 2018-03-27 MED ORDER — ACETAMINOPHEN 325 MG PO TABS
650.0000 mg | ORAL_TABLET | Freq: Once | ORAL | Status: AC
  Administered 2018-03-27: 650 mg via ORAL
  Filled 2018-03-27: qty 2

## 2018-03-27 MED ORDER — SODIUM CHLORIDE 0.9 % IV SOLN
400.0000 mg | Freq: Once | INTRAVENOUS | Status: AC
  Administered 2018-03-27: 400 mg via INTRAVENOUS
  Filled 2018-03-27: qty 8

## 2018-03-27 NOTE — Discharge Instructions (Signed)
Please follow-up with your neurologist in 2-3 days to have repeat Dilantin levels drawn.  You were  giving a loading dose here after discussing your case with pharmacy and the neurology team here.  Please return for any breakthrough seizures.If you develop worsening or new concerning symptoms you can return to the emergency department for re-evaluation.

## 2018-03-27 NOTE — ED Notes (Signed)
Pt's wife states the pt's phenytoin level is supposed to be 18-20.

## 2018-03-27 NOTE — ED Notes (Signed)
Patient verbalizes understanding of discharge instructions. Opportunity for questioning and answers were provided. Armband removed by staff, pt discharged from ED via wheelchair with [facility] transport staff, condom cath still in place per request.

## 2018-03-27 NOTE — ED Notes (Signed)
Pt complained of burning reaction related to Dilantin. Contacted pharmacy to see what changes to be made. Per pharmacist, medication can be infused over one hour instead of 30 minutes. Rate changed in [facility]

## 2018-03-27 NOTE — ED Notes (Signed)
Main lab contacted about lab value for albumin. They are running the test now.

## 2018-03-27 NOTE — ED Notes (Signed)
Wife present, pt now alert and oriented, slightly drowsy r/t seizure and medication. Speaking in clear sentences.

## 2018-03-27 NOTE — ED Provider Notes (Signed)
[facility] EMERGENCY DEPARTMENT Provider Note   CSN: [id number] Arrival date & time: 03/27/18  1093     History   Chief Complaint Chief Complaint  Patient presents with  . Seizures    HPI Cameron Potts is a 82 y.o. male with a history of a-fib on warfarin, intractable seizures (vagal nerve stimulator in place and currently on Vimpat, Dilantin and Keppra) who is followed by Dr. Jannifer Franklin of [facility] who currently lives at [place] home who presents for seizures.  Patient reportedly had a witnessed seizure lasting approximately 4 minutes by staff.  Patient was given IM Versed by EMS with resolution of the seizure.  Chart reviewed appears the patient's Dilantin level was low on 10/9, with a level of 12.5.  His neurologist recommended increasing the dose to try to 50 mg tablet in addition to the 200 mg twice daily dosing. His goal is 18-20.  No reported falls, head trauma or loss of consciousness.  Patient is currently protecting airway.  No seizure-like activity.  Level 5 caveat.   HPI  Past Medical History:  Diagnosis Date  . Abnormality of gait 12/21/2013  . Atrial fibrillation ([place])   . Blind left eye   . Cardiac arrest ([place])   . Glaucoma   . Hypercholesteremia   . Hyperlipidemia   . Mild left ventricular systolic dysfunction   . PSA elevation   . Right frontal lobe lesion   . Seizures ([place])   . Urinary urgency     Patient Active Problem List   Diagnosis Date Noted  . Hypercoagulability due to atrial fibrillation ([place]) 03/25/2018  . ICD (implantable cardioverter-defibrillator) in place 03/13/2018  . Parkinsonian syndrome associated with symptomatic orthostatic hypotension ([place]) 03/13/2018  . Refractory epilepsy ([place]) 03/13/2018  . Status post VNS (vagus nerve stimulator) placement 03/13/2018  . 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 Neuropathy, peripheral 12/14/2017  . Hyponatremia 07/12/2017  . Presence of intraocular lens 04/23/2017  . Status  epilepticus ([place]) 01/05/2017  . Chronic atrial fibrillation 01/04/2017  . Adjustment disorder with anxious mood 03/25/2014  . Age-related nuclear cataract of both eyes 03/25/2014  . 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  . Hyperlipidemia 03/25/2014  . Carcinoma in situ of prostate 02/08/2014  . Other localized visual field defect, bilateral 02/02/2014  . Abnormality of gait 12/21/2013  . Complex partial seizure ([place]) 04/11/2013  . Osteopenia 12/04/2011  . Vitamin D deficiency 12/04/2011    Past Surgical History:  Procedure Laterality Date  . CARDIAC DEFIBRILLATOR PLACEMENT    . cataract surgery    . COLONOSCOPY    . CRANIOTOMY Right    right anterior temporal resection  . defibrillator replaced  March 2016  . IMPLANTATION VAGAL NERVE STIMULATOR  June 2016  . NASAL SINUS SURGERY    . TONSILLECTOMY     age 6  . [place] Medications    Prior to Admission medications   Medication Sig Start Date End Date Taking? Authorizing Provider  ALPRAZolam (XANAX) 0.25 MG tablet Take 0.25 mg by mouth 2 (two) times daily.  11/26/17   [provider]  bimatoprost (LUMIGAN) 0.01 % SOLN Place 1 drop into both eyes at bedtime.  11/03/17   [provider]  brimonidine-timolol (COMBIGAN) 0.2-0.5 % ophthalmic solution Place 1 drop into both eyes every 12 (twelve) hours.  01/05/18   [provider]  cholecalciferol (VITAMIN D) 400 units TABS tablet Take 400 Units by  mouth 2 (two) times daily.    [provider]  finasteride (PROSCAR) 5 MG tablet Take 5 mg by mouth daily.     [provider]  flecainide (TAMBOCOR) 50 MG tablet Take 50 mg by mouth 2 (two) times daily.    [provider]  fluocinonide ointment (LIDEX) 0.81 % Apply 1 application topically 2 (two) times daily as needed (to face).     [provider]  lacosamide (VIMPAT) 200 MG TABS tablet Take 200 mg by mouth 2 (two) times daily.  09/30/17 10/01/18   [provider]  levETIRAcetam (KEPPRA) 750 MG tablet Take 1,500 mg by mouth 2 (two) times daily. 12/23/16   [provider]  LORazepam (ATIVAN) 2 MG tablet Take 2 mg by mouth daily as needed for seizure.    [provider]  phenytoin (DILANTIN) 100 MG ER capsule Take 2 capsules (200 mg total) by mouth 2 (two) times daily. 03/09/18   Kathrynn Ducking, MD  phenytoin (DILANTIN) 50 MG tablet Chew 0.5 tablets (25 mg total) by mouth daily. 03/25/18   Kathrynn Ducking, MD  sodium chloride 1 g tablet Take 1 g by mouth daily.    [provider]  triamcinolone cream (KENALOG) 0.1 % Apply 1 application topically 2 (two) times daily as needed (for flares).     [provider]  warfarin (COUMADIN) 2.5 MG tablet Take 1 tablet (2.5 mg) on Tues, Thurs, Sat, Sun, Take 1 and a half tablets ( equals 3.75 mg) on Mon, Wed, Fri. 01/27/18   [provider]    Family History Family History  Problem Relation Age of Onset  . Cancer - Lung Mother   . Cancer - Prostate Father     Social History Social History   Tobacco Use  . Smoking status: Former Smoker    Packs/day: 0.75    Years: 10.00    Pack years: 7.50    Types: Cigarettes  . Smokeless tobacco: Never Used  . Tobacco comment: quit 1960s  Substance Use Topics  . Alcohol use: No    Comment: former beer drinker  . Drug use: No     Allergies   Depakote [divalproex sodium]   Review of Systems Review of Systems  Unable to perform ROS: Mental status change  Level 5 caveat-patient is post ictal  Physical Exam Updated Vital Signs BP (!) 134/56 (BP Location: Left Arm)   Pulse 63   Temp 97.6 F (36.4 C) (Axillary)   Resp 17   Ht 5\' 8"  (1.727 m)   Wt 76.7 kg   SpO2 99%   BMI 25.70 kg/m   Physical Exam  Constitutional: He appears well-developed and well-nourished.  Appears post-ictal. Protecting airway  HENT:  Head: Normocephalic and atraumatic.  Right Ear: External ear normal.  Left  Ear: External ear normal.  Nose: Nose normal.  Mouth/Throat: Uvula is midline, oropharynx is clear and moist and mucous membranes are normal. No tonsillar exudate.  Prior stiches on right forehead  Eyes: Pupils are equal, round, and reactive to light. Right eye exhibits no discharge. Left eye exhibits no discharge. No scleral icterus.  Neck: Trachea normal. Neck supple. No spinous process tenderness present. No neck rigidity. Normal Potts of motion present.  Cardiovascular: Normal rate, regular rhythm and intact distal pulses.  No murmur heard. Pulses:      Radial pulses are 2+ on the right side, and 2+ on the left side.       Dorsalis pedis pulses  are 2+ on the right side, and 2+ on the left side.       Posterior tibial pulses are 2+ on the right side, and 2+ on the left side.  No lower extremity swelling or edema. Calves symmetric in size bilaterally.  Pulmonary/Chest: Effort normal and breath sounds normal. He exhibits no tenderness.  Abdominal: Soft. Bowel sounds are normal. There is no tenderness. There is no rebound and no guarding.  Musculoskeletal: He exhibits no edema.  Lymphadenopathy:    He has no cervical adenopathy.  Neurological: He is alert.  Skin: Skin is warm and dry. No rash noted. He is not diaphoretic.  Psychiatric: He has a normal mood and affect.  Nursing note and vitals reviewed.    ED Treatments / Results  Labs (all labs ordered are listed, but only abnormal results are displayed) Labs Reviewed  CBC WITH DIFFERENTIAL/PLATELET - Abnormal; Notable for the following components:      Result Value   RBC 3.78 (*)    Hemoglobin 11.8 (*)    HCT 36.5 (*)    All other components within normal limits  PROTIME-INR - Abnormal; Notable for the following components:   Prothrombin Time 20.9 (*)    All other components within normal limits  URINE CULTURE  BASIC METABOLIC PANEL  PHENYTOIN LEVEL, TOTAL  URINALYSIS, ROUTINE W REFLEX MICROSCOPIC  ALBUMIN  LEVETIRACETAM  LEVEL  CBG MONITORING, ED    EKG EKG Interpretation  Date/Time:  Friday March 27 2018 08:30:12 EDT Ventricular Rate:  61 PR Interval:    QRS Duration: 100 QT Interval:  442 QTC Calculation: 446 R Axis:   69 Text Interpretation:  Sinus or ectopic atrial rhythm Prolonged PR interval Anteroseptal infarct, old Borderline repolarization abnormality Confirmed by Nat Christen [phone]) on 03/27/2018 9:54:31 AM   Radiology No results found.  Procedures Procedures (including critical care time) No critical care  Medications Ordered in ED Medications  phenytoin (DILANTIN) 400 mg in sodium chloride 0.9 % 100 mL IVPB (has no administration in time Potts)  acetaminophen (TYLENOL) tablet 650 mg (650 mg Oral Given 03/27/18 1031)     Initial Impression / Assessment and Plan / ED Course  I have reviewed the triage vital signs and the nursing notes.  Pertinent labs & imaging results that were available during my care of the patient were reviewed by me and considered in my medical decision making (see chart for details).     82 y.o. male with a history of a-fib on warfarin, intractable seizures (vagal nerve stimulator in place and currently on Vimpat, Dilantin and Keppra) who is followed by Dr. Jannifer Franklin of [facility] who currently lives at [place] home who presents for breakthrough seizure that occurred this morning, lasting 4 min and was relieved after versed.  No head trauma or loss of conscious.  No indication for CT of the head or the neck.  Lab work is reassuring.  Patient's Dilantin level is 13.2.  His goal is 18-20.  Will consult neurology.  9:56 AM Patient is now alert and orientated. Wife is at bedside.   11:30 AM  Spoke with neurology, Dr. Rory Percy. Need to add albumin. Advised 400mg  x 1 IV. Check levels in next 3-4. Come back over weekend.   1:57 PM Albumin level is wnl. Will load patient with 400mg  IV. Will advise to continue home dose.   3:18 PM  Patient to  receive IV Dilantin. Can d/c after. Repeat dilantin level in 2-3 days with neurologist/nursing facility or  here. He is to return for breakthrough seizures.  Return precautions were discussed.  Family in agreement with plan.  Afternoon team made aware that he can be discharged after phenytoin loading dose.  Final Clinical Impressions(s) / ED Diagnoses   Final diagnoses:  Seizure [facility])    ED Discharge Orders    None       Lorelle Gibbs 03/27/18 1522    Nat Christen, MD 03/28/18 [phone]

## 2018-03-27 NOTE — ED Notes (Signed)
Wife requesting that the patient receive Tylenol. Will notify EDP.

## 2018-03-27 NOTE — ED Notes (Signed)
Main lab contacted about albumin lab. Main Lab has a machine down and there is a delay.

## 2018-03-27 NOTE — ED Triage Notes (Signed)
Pt arrives via EMS from [facility] with witnessed seizures. EMS reports one lasting 4 minutes but staff reported multiple before EMS arrival. Pt has nerve stimulator for this issue and blood work from 10/9 reports dilantin level at 12.5. Pt has known history of seizures.  5 mg versed given IM

## 2018-03-28 LAB — URINE CULTURE: Culture: NO GROWTH

## 2018-03-28 LAB — LEVETIRACETAM LEVEL: Levetiracetam Lvl: 51.1 ug/mL — ABNORMAL HIGH (ref 10.0–40.0)

## 2018-03-30 DIAGNOSIS — G40911 Epilepsy, unspecified, intractable, with status epilepticus: Secondary | ICD-10-CM | POA: Diagnosis not present

## 2018-03-30 DIAGNOSIS — R569 Unspecified convulsions: Secondary | ICD-10-CM | POA: Diagnosis not present

## 2018-03-30 NOTE — Telephone Encounter (Signed)
LMOM for Cameron Potts with below instructions for Dilantin (continue 200mg  bid, and add 50mg , 1/2 tab once daily.).  Please call with any questions/fim

## 2018-03-30 NOTE — Telephone Encounter (Signed)
FYI LPN Chantea@ [facility] has called and  is asking that they be called directly as it relates to any changes to pt  Medication changes.

## 2018-03-31 ENCOUNTER — Ambulatory Visit: Payer: Medicare Other

## 2018-03-31 ENCOUNTER — Telehealth: Payer: Self-pay | Admitting: Neurology

## 2018-03-31 NOTE — Telephone Encounter (Signed)
A repeat Dilantin level was drawn on 30 March 2018, level was 16.3.  The blood work was done to soon after the increase in Dilantin dosing, the Dilantin level clearly is rising, no change in dosing at this time.  Need to wait at least 2 more weeks before the Dilantin level was checked again.

## 2018-04-01 NOTE — Telephone Encounter (Signed)
I called Chantea back. Relayed Dr. Jannifer Franklin' message below. She will have patient get repeated Dilantin level on 04-15-18. Nothing further needed.

## 2018-04-01 NOTE — Telephone Encounter (Signed)
chantea@ [facility] has called re: orders she has received on pt re:the Dilantin, he is asking for a call to discuss further

## 2018-04-06 DIAGNOSIS — R410 Disorientation, unspecified: Secondary | ICD-10-CM | POA: Diagnosis not present

## 2018-04-06 DIAGNOSIS — R4182 Altered mental status, unspecified: Secondary | ICD-10-CM | POA: Diagnosis not present

## 2018-04-06 DIAGNOSIS — G40911 Epilepsy, unspecified, intractable, with status epilepticus: Secondary | ICD-10-CM | POA: Diagnosis not present

## 2018-04-06 DIAGNOSIS — N390 Urinary tract infection, site not specified: Secondary | ICD-10-CM | POA: Diagnosis not present

## 2018-04-06 NOTE — Telephone Encounter (Signed)
I called the patient.  The patient believes that he had a seizure yesterday morning, he has had some confusion following this.  We will get blood work to include a Dilantin level, CBC, differential, comprehensive metabolic profile, and urinalysis.

## 2018-04-06 NOTE — Telephone Encounter (Signed)
Patient is calling regarding previous message and would like a call back to discuss.

## 2018-04-06 NOTE — Addendum Note (Signed)
Addended by: Kathrynn Ducking on: 04/06/2018 05:08 PM   Modules accepted: Orders

## 2018-04-06 NOTE — Telephone Encounter (Signed)
Spoke with pt. He sts. he needs to speak with Dr. Jannifer Franklin regarding breakthru sz. He is agitated, upset that RN has called him back instead of Dr. Jannifer Franklin. I apologized, explained I did not get a message that he only wanted to speak with Dr. Jannifer Franklin, but will let Dr. Jannifer Franklin know. I spoke with Helene Kelp who sts. pt. is taking Dilantin 230mg  in the am, 200mg  qhs. (230mg  qam b/c family prefers name brand Dilantin, and they could only get this in 30 mg capsules). Teresa sts. pt. has had confusion since just after 7am yesterday. Pt. sts. he hears a loud compressor, but no loud noises heard by the nurses. They have not witnessed any sz. activity but sts. he is acting like he does after a sz. Per [facility], ok for dilantin level, as well as CBC with diff, CMP, u/a with c&s. These v/o given to Teresa/fim

## 2018-04-06 NOTE — Telephone Encounter (Signed)
Teresa@ [facility] has called to inform RN that she did not witness seizure but pt is status post seizure  Symptoms. Wife is asking if pt's dilantin levels can be checked. Helene Kelp said if approved a fax can just be sent over to [phone].  If a call is needed [phone]

## 2018-04-07 ENCOUNTER — Telehealth: Payer: Self-pay | Admitting: Neurology

## 2018-04-07 NOTE — Telephone Encounter (Signed)
The patient is undergone blood work as he has had a recent seizure.  White blood count was 6.4, hemoglobin of 12.0, hematocrit 34.6, MCV of 94.9, platelets of 219, urinalysis was unremarkable, Dilantin level 16.4, sodium 135, potassium 4.7, chloride 102, CO2 27, BUN of 16, creatinine of 0.88, glucose 96, calcium 8.7, total protein 6.9, albumin of 4.2, total bili of 0.3, alkaline phosphatase of 111, AST and ALT were normal.  We will increase the Dilantin dosing taking 30 mg capsules twice daily, he will remain on the 100 mg capsules taking 200 mg twice daily.  He will be taking a total of 460 mg of Dilantin daily.

## 2018-04-08 ENCOUNTER — Telehealth: Payer: Self-pay | Admitting: Neurology

## 2018-04-08 NOTE — Telephone Encounter (Signed)
Cameron Potts with [facility] calling regarding a fax that was sent. She is unable to read.

## 2018-04-08 NOTE — Telephone Encounter (Signed)
Spoke with Chantea at [facility] and clarified pt. is to take Dilantin 230mg  bid/fim

## 2018-04-15 ENCOUNTER — Non-Acute Institutional Stay (SKILLED_NURSING_FACILITY): Payer: Medicare Other

## 2018-04-15 DIAGNOSIS — G4089 Other seizures: Secondary | ICD-10-CM | POA: Diagnosis not present

## 2018-04-15 DIAGNOSIS — R569 Unspecified convulsions: Secondary | ICD-10-CM | POA: Diagnosis not present

## 2018-04-15 DIAGNOSIS — Z0000 Encounter for general adult medical examination without abnormal findings: Secondary | ICD-10-CM | POA: Diagnosis not present

## 2018-04-15 NOTE — Progress Notes (Signed)
Subjective:   Cameron Potts is a 82 y.o. male who presents for Medicare Annual/Subsequent preventive examination at [facility]  Last [name]-11/15/2016    Objective:    Vitals: BP 120/60 (BP Location: Left Arm, Patient Position: Sitting)   Pulse 64   Temp 98 F (36.7 C) (Oral)   Ht 5\' 8"  (1.727 m)   Wt 169 lb (76.7 kg)   BMI 25.70 kg/m   Body mass index is 25.7 kg/m.  Advanced Directives 04/15/2018 03/08/2018 03/04/2018 01/04/2017 01/09/2015 05/03/2014 04/10/2013  Does Patient Have a Medical Advance Directive? Yes Yes Yes Yes Yes Yes Patient has advance directive, copy not in chart  Type of Advance Directive [place];Living will;Out of facility DNR (pink MOST or yellow form) [place];Out of facility DNR (pink MOST or yellow form) [place];Living will;Out of facility DNR (pink MOST or yellow form) Living will;Healthcare Power of [place];Living will [place];Living will -  Does patient want to make changes to medical advance directive? No - Patient declined No - Patient declined No - Patient declined No - Patient declined - - -  Copy of [place] in Chart? Yes Yes Yes No - copy requested - - -  Pre-existing out of facility DNR order (yellow form or pink MOST form) Yellow form placed in chart (order not valid for inpatient use) Yellow form placed in chart (order not valid for inpatient use) Yellow form placed in chart (order not valid for inpatient use) - - - -    Tobacco Social History   Tobacco Use  Smoking Status Former Smoker  . Packs/day: 0.75  . Years: 10.00  . Pack years: 7.50  . Types: Cigarettes  Smokeless Tobacco Never Used  Tobacco Comment   quit 1960s     Counseling given: Not Answered Comment: quit 1960s   Clinical Intake:  Pre-visit preparation completed: No  Pain : No/denies pain     Diabetes: No  How often do you need  to have someone help you when you read instructions, pamphlets, or other written materials from your doctor or pharmacy?: 2 - Rarely What is the last grade level you completed in school?: college  Interpreter Needed?: No  Information entered by :: Tyson Dense, RN  Past Medical History:  Diagnosis Date  . Abnormality of gait 12/21/2013  . Atrial fibrillation ([facility])   . Blind left eye   . Cardiac arrest ([place])   . Glaucoma   . Hypercholesteremia   . Hyperlipidemia   . Mild left ventricular systolic dysfunction   . PSA elevation   . Right frontal lobe lesion   . Seizures ([place])   . Urinary urgency    Past Surgical History:  Procedure Laterality Date  . CARDIAC DEFIBRILLATOR PLACEMENT    . cataract surgery    . COLONOSCOPY    . CRANIOTOMY Right    right anterior temporal resection  . defibrillator replaced  March 2016  . IMPLANTATION VAGAL NERVE STIMULATOR  June 2016  . NASAL SINUS SURGERY    . TONSILLECTOMY     age 71  . VASECTOMY  1985   Family History  Problem Relation Age of Onset  . Cancer - Lung Mother   . Cancer - Prostate Father    Social History   Socioeconomic History  . Marital status: Married    Spouse name: Not on file  . Number of children: 3  . Years  of education: MBA  . Highest education level: Not on file  Occupational History  . Occupation: Retired  . Occupation: Physicist, medical Needs  . Financial resource strain: Not hard at all  . Food insecurity:    Worry: Never true    Inability: Never true  . Transportation needs:    Medical: No    Non-medical: No  Tobacco Use  . Smoking status: Former Smoker    Packs/day: 0.75    Years: 10.00    Pack years: 7.50    Types: Cigarettes  . Smokeless tobacco: Never Used  . Tobacco comment: quit 1960s  Substance and Sexual Activity  . Alcohol use: No    Comment: former beer drinker  . Drug use: No  . Sexual activity: Not Currently  Lifestyle  . Physical activity:    Days per week: 5 days      Minutes per session: 30 min  . Stress: Not at all  Relationships  . Social connections:    Talks on phone: Three times a week    Gets together: Three times a week    Attends religious service: Never    Active member of club or organization: No    Attends meetings of clubs or organizations: Never    Relationship status: Married  Other Topics Concern  . Not on file  Social History Narrative   Patient drinks 1-2 cups of caffeine daily.   Patient is right handed.      Tobacco use, amount per day ZOX:WRUE   Past tobacco use, amount per day:10   How many years did you use tobacco:20   Alcohol use (drinks per week): NONE   Diet:REGULAR   Do you drink/eat things with caffeine:COFFEE, PEPSI   Marital status: MARRIED                                 What year were you married? 1964   Do you live in a house, apartment, assisted living, condo, trailer, etc.? [place]   Is it one or more stories?   How many persons live in your home?   Do you have pets in your home?( please list) NO   Current or past profession: [employer]   Do you exercise?  YES                                Type and how often? 5 DAYS A WEEK FOR 30 MINUTES   Do you have a living will?YES   Do you have a DNR form?                                   If not, do you want to discuss one?   Do you have signed POA/HPOA forms?                        If so, please bring to you appointment          Outpatient Encounter Medications as of 04/15/2018  Medication Sig  . ALPRAZolam (XANAX) 0.25 MG tablet Take 0.25 mg by mouth 2 (two) times daily.   . bimatoprost (LUMIGAN) 0.01 % SOLN Place 1 drop into both eyes at bedtime.   . brimonidine-timolol (COMBIGAN) 0.2-0.5 %  ophthalmic solution Place 1 drop into both eyes every 12 (twelve) hours.   . cholecalciferol (VITAMIN D) 400 units TABS tablet Take 400 Units by mouth 2 (two) times daily.  . finasteride (PROSCAR) 5 MG tablet Take 5 mg by mouth daily.   .  flecainide (TAMBOCOR) 50 MG tablet Take 50 mg by mouth 2 (two) times daily.  . fluocinonide ointment (LIDEX) 3.41 % Apply 1 application topically 2 (two) times daily as needed (on rash /skin folds, not for face).   Marland Kitchen lacosamide (VIMPAT) 200 MG TABS tablet Take 200 mg by mouth 2 (two) times daily.   Marland Kitchen levETIRAcetam (KEPPRA) 750 MG tablet Take 1,500 mg by mouth 2 (two) times daily.  Marland Kitchen LORazepam (ATIVAN) 2 MG tablet Take 2 mg by mouth daily as needed for seizure.  . phenytoin (DILANTIN) 100 MG ER capsule Take 2 capsules (200 mg total) by mouth 2 (two) times daily. (Patient taking differently: Take 200 mg by mouth every evening. )  . phenytoin (DILANTIN) 30 MG ER capsule Take 30 mg by mouth 2 (two) times daily.  . sodium chloride 1 g tablet Take 1 g by mouth daily.  Marland Kitchen triamcinolone cream (KENALOG) 0.1 % Apply 1 application topically 2 (two) times daily as needed (for flares).   . warfarin (COUMADIN) 2.5 MG tablet Take 2.5-3.75 mg by mouth See admin instructions. Take 1 tablet (2.5 mg) on Tues, Thurs, Sat, Sun, Take 1 and a half tablets ( equals 3.75 mg) on Mon, Wed, Fri.   No facility-administered encounter medications on file as of 04/15/2018.     Activities of Daily Living In your present state of health, do you have any difficulty performing the following activities: 04/15/2018  Hearing? N  Vision? N  Difficulty concentrating or making decisions? N  Walking or climbing stairs? N  Dressing or bathing? N  Doing errands, shopping? N  Preparing Food and eating ? N  Using the Toilet? N  In the past six months, have you accidently leaked urine? N  Do you have problems with loss of bowel control? N  Managing your Medications? N  Managing your Finances? N  Housekeeping or managing your Housekeeping? N  Some recent data might be hidden    Patient Care Team: Gayland Curry, DO as PCP - General (Geriatric Medicine) Rutherford Guys, MD as Consulting Physician (Ophthalmology) Kathrynn Ducking,  MD as Consulting Physician (Neurology)   Assessment:   This is a routine wellness examination for Cameron Potts.  Exercise Activities and Dietary recommendations Current Exercise Habits: Home exercise routine, Type of exercise: walking, Time (Minutes): 30, Frequency (Times/Week): 5, Weekly Exercise (Minutes/Week): 150, Intensity: Mild, Exercise limited by: None identified  Goals   None     Fall Risk Fall Risk  03/25/2018  Falls in the past year? Yes  Number falls in past yr: 1  Injury with Fall? Yes   Is the patient's home free of loose throw rugs in walkways, pet beds, electrical cords, etc?   yes      Grab bars in the bathroom? yes      Handrails on the stairs?   yes      Adequate lighting?   yes  Timed Get Up and Go Performed: 16 seconds  Depression Screen PHQ 2/9 Scores 03/25/2018  PHQ - 2 Score 0    Cognitive Function completed within the last year MMSE - Mini Mental State Exam 02/15/2018  Orientation to time 5  Orientation to Place 5  Registration 3  Attention/ Calculation 5  Recall 3  Language- name 2 objects 2  Language- repeat 1  Language- follow 3 step command 3  Language- read & follow direction 1  Write a sentence 1  Copy design 1  Total score 30        Immunization History  Administered Date(s) Administered  . Influenza, Seasonal, Injecte, Preservative Fre 02/22/2010  . Influenza,inj,Quad PF,6+ Mos 04/07/2018  . Influenza-Unspecified 03/17/2013, 02/24/2014, 02/16/2015, 03/21/2016, 03/17/2017  . Pneumococcal Conjugate-13 07/05/2013  . Pneumococcal Polysaccharide-23 06/17/2006  . Td 06/18/2007  . Tdap 12/07/2015  . Zoster 10/06/2008  . Zoster Recombinat (Shingrix) 12/04/2016    Qualifies for Shingles Vaccine? Had first shot 12/04/2016  Screening Tests Health Maintenance  Topic Date Due  . Samul Dada  12/06/2025  . INFLUENZA VACCINE  Completed  . PNA vac Low Risk Adult  Completed   Cancer Screenings: Lung: Low Dose CT Chest recommended if Age 5-80  years, 30 pack-year currently smoking OR have quit w/in 15years. Patient does not qualify. Colorectal: up to date  Additional Screenings:  Hepatitis C Screening: declined      Plan:    I have personally reviewed and addressed the Medicare Annual Wellness questionnaire and have noted the following in the patient's chart:  A. Medical and social history B. Use of alcohol, tobacco or illicit drugs  C. Current medications and supplements D. Functional ability and status E.  Nutritional status F.  Physical activity G. Advance directives H. List of other physicians I.  Hospitalizations, surgeries, and ER visits in previous 12 months J.  [place] to include hearing, vision, cognitive, depression L. Referrals and appointments - none  In addition, I have reviewed and discussed with patient certain preventive protocols, quality metrics, and best practice recommendations. A written personalized care plan for preventive services as well as general preventive health recommendations were provided to patient.  See attached scanned questionnaire for additional information.   Signed,   Tyson Dense, RN Nurse Health Advisor  Patient Concerns: None

## 2018-04-15 NOTE — Patient Instructions (Signed)
Cameron Potts , I have completed the annual wellness visit as per Medicare guidelines. The attached information is provided for the patient's family, care providers, and facility of residence.  Screening recommendations/referrals: Colonoscopy excluded, over age 80 Recommended yearly ophthalmology/optometry visit for glaucoma screening and checkup Recommended yearly dental visit for hygiene and checkup  Vaccinations: Influenza vaccine up to date Pneumococcal vaccine up to date, completed Tdap vaccine up to date, due 12/06/2025 Shingles vaccine- had first shingrix shot in 2018   Advanced directives: In chart  Conditions/risks identified: Fall risk   Preventive Care 77 Years and Older, Male Preventive care refers to lifestyle choices and visits with your health care provider that can promote health and wellness. What does preventive care include?  A yearly physical exam. This is also called an annual well check.  Dental exams once or twice a year.  Routine eye exams. Ask your health care provider how often you should have your eyes checked.  Personal lifestyle choices, including:  Daily care of your teeth and gums.  Regular physical activity.  Eating a healthy diet.  Avoiding tobacco and drug use.  Limiting alcohol use.  Taking vitamin and mineral supplements as recommended by your health care provider. What happens during an annual well check? The services and screenings done by your health care provider during your annual well check will depend on your age, overall health, lifestyle risk factors, and family history of disease. Counseling  Your health care provider may ask you questions about your:  Alcohol use.  Tobacco use.  Drug use.  Emotional well-being.  Home and relationship well-being.  Sexual activity.  Eating habits.  History of falls.  Memory and ability to understand (cognition).  Work and work Statistician. Screening  You may have the following  tests or measurements:  Height, weight, and BMI.  Blood pressure.  Lipid and cholesterol levels. These may be checked every 5 years, or more frequently if you are over 78 years old.  Skin check.  Lung cancer screening. You may have this screening every year starting at age 60 if you have a 30-pack-year history of smoking and currently smoke or have quit within the past 15 years.  Fecal occult blood test (FOBT) of the stool. You may have this test every year starting at age 76.  Flexible sigmoidoscopy or colonoscopy. You may have a sigmoidoscopy every 5 years or a colonoscopy every 10 years starting at age 42.  Prostate cancer screening. Recommendations will vary depending on your family history and other risks.  Hepatitis C blood test.  Hepatitis B blood test.  Diabetes screening. This is done by checking your blood sugar (glucose) after you have not eaten for a while (fasting). You may have this done every 1-3 years.  Abdominal aortic aneurysm (AAA) screening. You may need this if you are a current or former smoker.  Osteoporosis. You may be screened starting at age 58 if you are at high risk. Talk with your health care provider about your test results, treatment options, and if necessary, the need for more tests. Vaccines  Your health care provider may recommend certain vaccines, such as:  Influenza vaccine. This is recommended every year.  Tetanus, diphtheria, and acellular pertussis (Tdap, Td) vaccine. You may need a Td booster every 10 years.  Zoster vaccine. You may need this after age 17.  Pneumococcal 13-valent conjugate (PCV13) vaccine. One dose is recommended after age 46.  Pneumococcal polysaccharide (PPSV23) vaccine. One dose is recommended after age 21. Talk  to your health care provider about which screenings and vaccines you need and how often you need them. This information is not intended to replace advice given to you by your health care provider. Make sure  you discuss any questions you have with your health care provider. Document Released: 06/30/2015 Document Revised: 02/21/2016 Document Reviewed: 04/04/2015 Elsevier Interactive Patient Education  2017 [place] can cause injuries. They can happen to people of all ages. There are many things you can do to make your home safe and to help prevent falls.  What can I do in the bathroom?  Use night lights.  Install grab bars by the toilet and in the tub and shower. Do not use towel bars as grab bars.  Use non-skid mats or decals in the tub or shower.  If you need to sit down in the shower, use a plastic, non-slip stool.  Keep the floor dry. Clean up any water that spills on the floor as soon as it happens.  Remove soap buildup in the tub or shower regularly.  Attach bath mats securely with double-sided non-slip rug tape.  Do not have throw rugs and other things on the floor that can make you trip. What can I do in the bedroom?  Use night lights.  Make sure that you have a light by your bed that is easy to reach.  Do not use any sheets or blankets that are too big for your bed. They should not hang down onto the floor.  Have a firm chair that has side arms. You can use this for support while you get dressed.  Do not have throw rugs and other things on the floor that can make you trip. What else can I do to help prevent falls?  Wear shoes that:  Do not have high heels.  Have rubber bottoms.  Are comfortable and fit you well.  Are closed at the toe. Do not wear sandals.  If you use a stepladder:  Make sure that it is fully opened. Do not climb a closed stepladder.  Make sure that both sides of the stepladder are locked into place.  Ask someone to hold it for you, if possible.  Clearly mark and make sure that you can see:  Any grab bars or handrails.  First and last steps.  Where the edge of each step is.  Use tools that help you move  around (mobility aids) if they are needed. These include:  Canes.  Walkers.  Scooters.  Crutches.  Turn on the lights when you go into a dark area. Replace any light bulbs as soon as they burn out.  Set up your furniture so you have a clear path. Avoid moving your furniture around.  If any of your floors are uneven, fix them.  Review your medicines with your doctor. Some medicines can make you feel dizzy. This can increase your chance of falling. Ask your doctor what other things that you can do to help prevent falls. This information is not intended to replace advice given to you by your health care provider. Make sure you discuss any questions you have with your health care provider. Document Released: 03/30/2009 Document Revised: 11/09/2015 Document Reviewed: 07/08/2014 Elsevier Interactive Patient Education  2017 [employer].

## 2018-04-16 ENCOUNTER — Telehealth: Payer: Self-pay | Admitting: *Deleted

## 2018-04-16 DIAGNOSIS — Z23 Encounter for immunization: Secondary | ICD-10-CM | POA: Diagnosis not present

## 2018-04-16 NOTE — Telephone Encounter (Signed)
The Dilantin level is now 17.3, this is getting very close to where we would like to have the Dilantin level, I will not alter the dose at this time.

## 2018-04-16 NOTE — Telephone Encounter (Signed)
Dilantin level drawn at [facility] on 04/15/18 is 17.3/fim

## 2018-04-21 ENCOUNTER — Other Ambulatory Visit: Payer: Self-pay | Admitting: *Deleted

## 2018-04-21 NOTE — Telephone Encounter (Signed)
Patient came to healthcare asking for HMR 1% lotion, for grover's disease , order written for assisted living.

## 2018-04-22 NOTE — Telephone Encounter (Signed)
[facility] requesting a call to discus when the pt is needing his dilantin levels checked. please advsie at [phone]

## 2018-04-22 NOTE — Telephone Encounter (Signed)
I called the facility, the last Dilantin level 17.3, his goal is 18.  I would recheck the blood work in about 2 weeks.

## 2018-05-07 DIAGNOSIS — G4089 Other seizures: Secondary | ICD-10-CM | POA: Diagnosis not present

## 2018-05-07 DIAGNOSIS — R569 Unspecified convulsions: Secondary | ICD-10-CM | POA: Diagnosis not present

## 2018-05-11 ENCOUNTER — Telehealth: Payer: Self-pay | Admitting: Neurology

## 2018-05-11 NOTE — Telephone Encounter (Signed)
Cameron Potts from [facility] called to see if we received Dilantin levels on the 21st. I told her that I did not see any record of this. She asked for the nurse to please call.

## 2018-05-11 NOTE — Telephone Encounter (Signed)
[phone].  °

## 2018-05-11 NOTE — Telephone Encounter (Signed)
Dilantin level is 17.9, drawn on 07 May 2018, no change in medical therapy at this time.

## 2018-05-11 NOTE — Telephone Encounter (Signed)
Spoke with Chantea at [employer] [facility] and advised we did get Dilantin level this morning. I have it in Dr. Jannifer Franklin' inbox for review/fim

## 2018-05-18 NOTE — Telephone Encounter (Signed)
Request for next dilantin level placed in Dr. Jacklynn Ganong' inbox/fim

## 2018-05-18 NOTE — Telephone Encounter (Signed)
Cameron Potts is asking for a call from RN of Dr Jannifer Franklin re: pt's Dilantin level .  Cameron Potts is giving her fax # for an order of next dilantin level fax# [phone]

## 2018-05-19 NOTE — Telephone Encounter (Signed)
Chantea with [facility] calling to find out when to redraw Dilantin levels.

## 2018-05-19 NOTE — Telephone Encounter (Signed)
Duplicate call/fim

## 2018-05-19 NOTE — Telephone Encounter (Signed)
Unless the patient is having further seizures or evidence of toxicity, no indication for further Dilantin levels until next visit.

## 2018-05-19 NOTE — Telephone Encounter (Signed)
I spoke with Cameron Potts at [facility] and advised that no more Dilantin levels needed at this time/fim

## 2018-05-27 ENCOUNTER — Encounter: Payer: Self-pay | Admitting: Internal Medicine

## 2018-05-27 ENCOUNTER — Non-Acute Institutional Stay: Payer: Medicare Other | Admitting: Internal Medicine

## 2018-05-27 VITALS — BP 132/62 | HR 69 | Temp 97.9°F | Ht 68.0 in | Wt 168.0 lb

## 2018-05-27 DIAGNOSIS — I4891 Unspecified atrial fibrillation: Secondary | ICD-10-CM

## 2018-05-27 DIAGNOSIS — G903 Multi-system degeneration of the autonomic nervous system: Secondary | ICD-10-CM | POA: Diagnosis not present

## 2018-05-27 DIAGNOSIS — Z9689 Presence of other specified functional implants: Secondary | ICD-10-CM | POA: Diagnosis not present

## 2018-05-27 DIAGNOSIS — D6869 Other thrombophilia: Secondary | ICD-10-CM

## 2018-05-27 DIAGNOSIS — G40919 Epilepsy, unspecified, intractable, without status epilepticus: Secondary | ICD-10-CM

## 2018-05-27 DIAGNOSIS — I482 Chronic atrial fibrillation, unspecified: Secondary | ICD-10-CM

## 2018-05-27 DIAGNOSIS — F4322 Adjustment disorder with anxiety: Secondary | ICD-10-CM

## 2018-05-27 DIAGNOSIS — I951 Orthostatic hypotension: Secondary | ICD-10-CM

## 2018-05-27 DIAGNOSIS — G20 Parkinson's disease: Secondary | ICD-10-CM

## 2018-05-27 NOTE — Progress Notes (Signed)
Location:  Occupational psychologist of Service:  Clinic (12)  Provider: Makynlie Rossini L. Mariea Clonts, D.O., C.M.D.  Code Status: DNR on file Goals of Care:  Advanced Directives 04/15/2018  Does Patient Have a Medical Advance Directive? Yes  Type of Paramedic of [place];Living will;Out of facility DNR (pink MOST or yellow form)  Does patient want to make changes to medical advance directive? No - Patient declined  Copy of [place] in Chart? Yes  Pre-existing out of facility DNR order (yellow form or pink MOST form) Yellow form placed in chart (order not valid for inpatient use)     Chief Complaint  Patient presents with  . Medical Management of Chronic Issues    76mth follow-up    HPI: Patient is a 82 y.o. male seen today for medical management of chronic diseases.    He's fallen a few times.  Friday night coming back from the bistro for [employer] 100th bday and almost to his room, when he fell.  He is not sure if that was due to his orthostatic hypotension.    He had the one mild seizure 03/27/18 when he first got here.  He's had no auras or feeling related to epilepsy.  He will get an occasional jerk.    He wants to avoid medication for parkinsonism considering his seizure medications.    He was meant to have his INR rechecked 12/24 but will be away visiting his wife in [facility] who had knee surgery.    He reports heavy yellow mucus and runny nose at times.  Sometimes there are little brown specks in it.  Has not felt like he has a cold though.   He is going to see his doctor in [facility] while he is visiting there.  This is the Parkinsonism expert there.  He says he's not going to worry a lot about it.  He had hoped to do some things in retirement like return to [place] where he worked at one time, but knows this won't be able to happen.    He got a skin tear on his right hand when he fell.  He goes to the aquatic and fitness center daily,  sometimes twice a day.  He tries to stay active.    His last Na level is normal on his sodium tablets.    He still questions if he needs to be here. He says he will NOT spend the rest of his life here.  He is aware of the advantages of being here like taking the pressure off his wife, but he does not want this to be long-term.   Past Medical History:  Diagnosis Date  . Abnormality of gait 12/21/2013  . Atrial fibrillation ([place])   . Blind left eye   . Cardiac arrest ([place])   . Glaucoma   . Hypercholesteremia   . Hyperlipidemia   . Mild left ventricular systolic dysfunction   . PSA elevation   . Right frontal lobe lesion   . Seizures ([place])   . Urinary urgency     Past Surgical History:  Procedure Laterality Date  . CARDIAC DEFIBRILLATOR PLACEMENT    . cataract surgery    . COLONOSCOPY    . CRANIOTOMY Right    right anterior temporal resection  . defibrillator replaced  March 2016  . IMPLANTATION VAGAL NERVE STIMULATOR  June 2016  . NASAL SINUS SURGERY    . TONSILLECTOMY     age  5  . VASECTOMY  1985    Allergies  Allergen Reactions  . Depakote [Divalproex Sodium] Other (See Comments)    Arthralgias     Outpatient Encounter Medications as of 05/27/2018  Medication Sig  . ALPRAZolam (XANAX) 0.25 MG tablet Take 0.25 mg by mouth 2 (two) times daily.   . bimatoprost (LUMIGAN) 0.01 % SOLN Place 1 drop into both eyes at bedtime.   . brimonidine-timolol (COMBIGAN) 0.2-0.5 % ophthalmic solution Place 1 drop into both eyes every 12 (twelve) hours.   . cholecalciferol (VITAMIN D) 400 units TABS tablet Take 400 Units by mouth 2 (two) times daily.  . finasteride (PROSCAR) 5 MG tablet Take 5 mg by mouth daily.   . flecainide (TAMBOCOR) 50 MG tablet Take 50 mg by mouth 2 (two) times daily.  . fluocinonide ointment (LIDEX) 7.85 % Apply 1 application topically 2 (two) times daily as needed (on rash /skin folds, not for face).   Marland Kitchen lacosamide (VIMPAT) 200 MG TABS tablet Take 200 mg by  mouth 2 (two) times daily.   Marland Kitchen levETIRAcetam (KEPPRA) 750 MG tablet Take 1,500 mg by mouth 2 (two) times daily.  Marland Kitchen LORazepam (ATIVAN) 2 MG tablet Take 2 mg by mouth daily as needed for seizure.  . phenytoin (DILANTIN) 100 MG ER capsule Take 2 capsules (200 mg total) by mouth 2 (two) times daily.  . phenytoin (DILANTIN) 30 MG ER capsule Take 30 mg by mouth 2 (two) times daily.  . sodium chloride 1 g tablet Take 1 g by mouth daily.  Marland Kitchen triamcinolone cream (KENALOG) 0.1 % Apply 1 application topically 2 (two) times daily as needed (for flares).   . warfarin (COUMADIN) 2.5 MG tablet Take 1 tablet (2.5 mg) on Tues, Thurs, Sat, Sun, Take 1 and a half tablets ( equals 3.75 mg) on Mon, Wed, Fri.   No facility-administered encounter medications on file as of 05/27/2018.     Review of Systems:  Review of Systems  Constitutional: Negative for chills and fever.  HENT: Positive for congestion. Negative for sore throat.   Eyes: Negative for blurred vision.  Respiratory: Negative for shortness of breath.   Cardiovascular: Negative for chest pain, palpitations and leg swelling.       Orthostatic hypotension  Gastrointestinal: Negative for abdominal pain, blood in stool, constipation, diarrhea and melena.  Genitourinary: Negative for dysuria.  Musculoskeletal: Positive for falls. Negative for joint pain and myalgias.  Neurological: Positive for seizures. Negative for dizziness, loss of consciousness and weakness.  Endo/Heme/Allergies: Bruises/bleeds easily.  Psychiatric/Behavioral: Positive for memory loss. Negative for depression. The patient is not nervous/anxious and does not have insomnia.        Reports being adjusted, but not wanting to stay long term    Health Maintenance  Topic Date Due  . TETANUS/TDAP  12/06/2025  . INFLUENZA VACCINE  Completed  . PNA vac Low Risk Adult  Completed    Physical Exam: Vitals:   05/27/18 1346  BP: 132/62  Pulse: 69  Temp: 97.9 F (36.6 C)  TempSrc: Oral   SpO2: 96%  Weight: 168 lb (76.2 kg)  Height: 5\' 8"  (1.727 m)   Body mass index is 25.54 kg/m. Physical Exam  Constitutional: He is oriented to person, place, and time. He appears well-developed and well-nourished. No distress.  HENT:  Head: Normocephalic and atraumatic.  Cardiovascular: Normal rate, regular rhythm, normal heart sounds and intact distal pulses.  Pulmonary/Chest: Effort normal and breath sounds normal. No respiratory distress.  Abdominal: Soft.  Bowel sounds are normal. He exhibits no distension.  Musculoskeletal: Normal range of motion. He exhibits no tenderness.  Neurological: He is alert and oriented to person, place, and time.  But some short-term memory loss  Skin: Skin is warm and dry.  Psychiatric: He has a normal mood and affect.    Labs reviewed: Basic Metabolic Panel: Recent Labs    03/04/18 1311 03/27/18 0841  NA 142 139  K 4.9 4.4  CL 102 105  CO2 25 27  GLUCOSE 87 94  BUN 17 12  CREATININE 0.86 0.89  CALCIUM 9.0 8.9   Liver Function Tests: Recent Labs    03/04/18 1311 03/27/18 1154  AST 29  --   ALT 25  --   ALKPHOS 128*  --   BILITOT 0.2  --   PROT 7.3  --   ALBUMIN 4.6 3.8   No results for input(s): LIPASE, AMYLASE in the last 8760 hours. No results for input(s): AMMONIA in the last 8760 hours. CBC: Recent Labs    03/04/18 1311 03/27/18 0841  WBC 6.2 4.9  NEUTROABS 3.6 2.6  HGB 12.3* 11.8*  HCT 35.6* 36.5*  MCV 92 96.6  PLT 217 193   Assessment/Plan 1. Refractory epilepsy ([place]) -one seizure as above, cont current extensive regimen per Dr. Jannifer Franklin, neurologist  2. Parkinsonian syndrome associated with symptomatic orthostatic hypotension (HCC) -pt does not seem to accept that he truly has any variation of parkinsonism despite prior neurology consults -encouraged cane use, not just carrying to help prevent falls -not on meds for this but tremor is not significant--more dysautonomia in his case  3. Status post VNS (vagus  nerve stimulator) placement -remains in place and to be swiped in case of seizure  4. Chronic atrial fibrillation -cont coumadin for INR goal 2-3 -will check INR 12/17 instead of 12/24 as planned due to his intended LOA to [facility] 07/07/17-06/26/18  5. Hypercoagulability due to atrial fibrillation (HCC) -ongoing, cont coumadin as above  6. Adjustment disorder with anxious mood -seems adjusted better now, but still not happy about his circumstances -cont prn xanax at his and family request  Labs/tests ordered:  INR 12/17 by nursing and results to me  Next appt:  Needs appt when returns from [facility] for INR check  Shalin Linders L. Eveleigh Crumpler, D.O. [facility] [address], [place] [address] Cell Phone (Mon-Fri 8am-5pm):  [phone] On Call:  [phone] & follow prompts after 5pm & weekends Office Phone:  [phone] Office Fax:  [phone]

## 2018-06-01 ENCOUNTER — Other Ambulatory Visit: Payer: Self-pay | Admitting: Adult Health

## 2018-06-01 MED ORDER — ALPRAZOLAM 0.25 MG PO TABS: 0.2500 mg | ORAL_TABLET | Freq: Two times a day (BID) | ORAL | 2 refills | Status: DC

## 2018-06-01 MED ORDER — LACOSAMIDE 200 MG PO TABS: 200.0000 mg | ORAL_TABLET | Freq: Two times a day (BID) | ORAL | 11 refills | Status: DC

## 2018-06-08 ENCOUNTER — Telehealth: Payer: Self-pay

## 2018-06-08 ENCOUNTER — Other Ambulatory Visit: Payer: Self-pay | Admitting: Internal Medicine

## 2018-06-08 DIAGNOSIS — G40211 Localization-related (focal) (partial) symptomatic epilepsy and epileptic syndromes with complex partial seizures, intractable, with status epilepticus: Secondary | ICD-10-CM

## 2018-06-08 MED ORDER — LEVETIRACETAM 750 MG PO TABS: 1500.0000 mg | ORAL_TABLET | Freq: Two times a day (BID) | ORAL | 0 refills | Status: DC

## 2018-06-08 NOTE — Telephone Encounter (Signed)
[place] (pharmacist) [facility] [place] called requesting a refill on Keppra. Patient is in [place] and was sent with all his medications except Keppra. Per [place] patient will need a 2 week supply.  Per Dr.Reed (standing close by) we need to follow-up with [facility] for [place]. [place] aware I will contact her back once I speak with facility that patient resides in.

## 2018-06-08 NOTE — Telephone Encounter (Signed)
Rx. Sent to [facility]

## 2018-06-08 NOTE — Telephone Encounter (Signed)
I called [facility] at [phone], spoke with Consuelo Pandy transferred me to [place]. Spoke with Baxter Flattery who transferred me to the nurse Shantay.   Per Chantea patient left on 06/06/18. Per Chantea everything should of been sent yet she is not sure and can not give a definite. Chantea located 2 packs of Keppra on cart. Chantea thinks we should send rx.  Dr.Reed please advise

## 2018-06-08 NOTE — Progress Notes (Signed)
Rx for 2 weeks' worth of keppra (56 tabs) [facility] [place] due to patient's keppra not getting sent with his other meds.

## 2018-06-09 DIAGNOSIS — R5383 Other fatigue: Secondary | ICD-10-CM | POA: Diagnosis not present

## 2018-06-11 DIAGNOSIS — R5383 Other fatigue: Secondary | ICD-10-CM | POA: Diagnosis not present

## 2018-06-23 ENCOUNTER — Telehealth: Payer: Self-pay

## 2018-06-23 NOTE — Telephone Encounter (Signed)
Received fax from [facility] requesting Dr. Jannifer Franklin to sign order for Dilantin level to be drawn. Order stated the pt has been unsteady with his gait/balance.  Dr. Jannifer Franklin signed order and faxed back to [facility] # [phone] with confirmation received. MB RN

## 2018-06-28 ENCOUNTER — Other Ambulatory Visit: Payer: Self-pay | Admitting: Internal Medicine

## 2018-06-28 DIAGNOSIS — G40211 Localization-related (focal) (partial) symptomatic epilepsy and epileptic syndromes with complex partial seizures, intractable, with status epilepticus: Secondary | ICD-10-CM

## 2018-06-29 NOTE — Telephone Encounter (Signed)
I contacted [facility] to verify the fax #  To send the order.  Fax number to submit order to [phone]. Order sent and confirmation received.  Pt's wife, Opal Sidles notified.

## 2018-06-29 NOTE — Telephone Encounter (Signed)
Wife Cameron Potts calling to check on status on fax to [facility] for Dilantin level. They did not receive fax. The fax number in the phone note is our own. Please check into & call wife. [facility] assisted living nursing station phone is [phone]

## 2018-06-30 DIAGNOSIS — R569 Unspecified convulsions: Secondary | ICD-10-CM | POA: Diagnosis not present

## 2018-06-30 DIAGNOSIS — G40911 Epilepsy, unspecified, intractable, with status epilepticus: Secondary | ICD-10-CM | POA: Diagnosis not present

## 2018-07-01 ENCOUNTER — Telehealth: Payer: Self-pay | Admitting: *Deleted

## 2018-07-01 ENCOUNTER — Telehealth: Payer: Self-pay | Admitting: Neurology

## 2018-07-01 NOTE — Telephone Encounter (Signed)
Order for dilantin 30 mg capsule one daily and recheck of dilantin in 2 weeks submitted to [facility] via fax. Confirmation received.

## 2018-07-01 NOTE — Telephone Encounter (Signed)
Spoke with [facility] this morning regarding pt's levels. Separate telephone note from 07/01/18 fwd to Dr. Jannifer Franklin to review/advise.

## 2018-07-01 NOTE — Telephone Encounter (Signed)
Per Dr. Brett Fairy, she received a call from [facility] last night. Pt's dilantin level was critically high. She directed staff at [facility] to hold the am Dilantin dose today and give pt. a high protein diet./fim

## 2018-07-01 NOTE — Telephone Encounter (Signed)
I was informed that the Dilantin level was elevated at 26.8.  We will reduce the Dilantin dosing to 130 mg capsule daily, he will continue the 100 mg capsules taking 2 twice daily, recheck the blood work in 2 weeks.  We will fax the order to [facility] at [phone].   I called and talked to the wife.

## 2018-07-01 NOTE — Telephone Encounter (Signed)
Patient with Dilantin toxicity- feeling dizzy and walking unsteady.  Last level was 17, now 26.8 phenytoin. Had no free dilating ordered.  I asked Beth to hold the dilantin in the morning today- and give the patient his regular dose at night unless you want to change the dose,  Currently on 230 mg bid? CD

## 2018-07-01 NOTE — Telephone Encounter (Signed)
I contacted Water engineer at [facility]. She stated pt's dilantin level was checked yesterday morning. Result was 26.8 and ref range for the pts levels is 18-20. Shante held Dilatin dosage for this morning and has a order to recheck labs on 07/07/18. She wanted to notify Dr. Jannifer Franklin of this update. I advised I would fwd to Dr. Jannifer Franklin to review/advise. Doylene Bode was agreeable to this recommendation.

## 2018-07-01 NOTE — Telephone Encounter (Signed)
Please see prior telephone note 

## 2018-07-01 NOTE — Telephone Encounter (Signed)
RN Doylene Bode [facility] is asking for a call from [place] re: High Dilantin levels, please call

## 2018-07-01 NOTE — Telephone Encounter (Addendum)
Pt's wife(ok per DPR) called in regarding message about dilantin levels. She states the pt is scheduled to receive next dosage at 630 pm and the [facility] is unsure of the dosage due to the recent lab check. I advised I would fwd to Dr. Jannifer Franklin to verify his recommendation.  Wife states orders from Dr. Jannifer Franklin will need to be faxed to [phone] attention Nancy(pt's nurse for the evening).  Phone # for [facility] [phone] and phone # for Mrs. [facility] [phone] Wife is requesting a call back once Dr. Jannifer Franklin has made a recommendation on Dilantin levels/med changes if needed. I advised I would fwd to Dr. Jannifer Franklin.  Pt's wife, Cameron Potts was agreeable.

## 2018-07-06 ENCOUNTER — Other Ambulatory Visit: Payer: Self-pay | Admitting: *Deleted

## 2018-07-06 DIAGNOSIS — G40211 Localization-related (focal) (partial) symptomatic epilepsy and epileptic syndromes with complex partial seizures, intractable, with status epilepticus: Secondary | ICD-10-CM

## 2018-07-06 NOTE — Telephone Encounter (Signed)
Called spoke with [facility] [place] pt is back in [place], I will fax back and decline this medication.

## 2018-07-06 NOTE — Telephone Encounter (Signed)
[facility] and sent to [name] for approval.

## 2018-07-07 DIAGNOSIS — Z85820 Personal history of malignant melanoma of skin: Secondary | ICD-10-CM | POA: Diagnosis not present

## 2018-07-07 DIAGNOSIS — L308 Other specified dermatitis: Secondary | ICD-10-CM | POA: Diagnosis not present

## 2018-07-07 DIAGNOSIS — G40911 Epilepsy, unspecified, intractable, with status epilepticus: Secondary | ICD-10-CM | POA: Diagnosis not present

## 2018-07-07 DIAGNOSIS — G4089 Other seizures: Secondary | ICD-10-CM | POA: Diagnosis not present

## 2018-07-07 DIAGNOSIS — R569 Unspecified convulsions: Secondary | ICD-10-CM | POA: Diagnosis not present

## 2018-07-07 DIAGNOSIS — L570 Actinic keratosis: Secondary | ICD-10-CM | POA: Diagnosis not present

## 2018-07-21 DIAGNOSIS — H401133 Primary open-angle glaucoma, bilateral, severe stage: Secondary | ICD-10-CM | POA: Diagnosis not present

## 2018-09-09 ENCOUNTER — Telehealth: Payer: Self-pay | Admitting: Neurology

## 2018-09-09 ENCOUNTER — Non-Acute Institutional Stay: Payer: Medicare Other | Admitting: Internal Medicine

## 2018-09-09 ENCOUNTER — Encounter: Payer: Self-pay | Admitting: Internal Medicine

## 2018-09-09 VITALS — BP 130/80 | HR 75 | Temp 98.1°F | Ht 68.0 in | Wt 175.0 lb

## 2018-09-09 DIAGNOSIS — I482 Chronic atrial fibrillation, unspecified: Secondary | ICD-10-CM | POA: Diagnosis not present

## 2018-09-09 DIAGNOSIS — G20 Parkinson's disease: Secondary | ICD-10-CM

## 2018-09-09 DIAGNOSIS — I4891 Unspecified atrial fibrillation: Secondary | ICD-10-CM | POA: Diagnosis not present

## 2018-09-09 DIAGNOSIS — G903 Multi-system degeneration of the autonomic nervous system: Secondary | ICD-10-CM

## 2018-09-09 DIAGNOSIS — G40919 Epilepsy, unspecified, intractable, without status epilepticus: Secondary | ICD-10-CM | POA: Diagnosis not present

## 2018-09-09 DIAGNOSIS — I951 Orthostatic hypotension: Secondary | ICD-10-CM

## 2018-09-09 DIAGNOSIS — Z9689 Presence of other specified functional implants: Secondary | ICD-10-CM | POA: Diagnosis not present

## 2018-09-09 DIAGNOSIS — D6869 Other thrombophilia: Secondary | ICD-10-CM | POA: Diagnosis not present

## 2018-09-09 DIAGNOSIS — Z95810 Presence of automatic (implantable) cardiac defibrillator: Secondary | ICD-10-CM | POA: Diagnosis not present

## 2018-09-09 LAB — POCT INR: INR: 3.2 — AB (ref 2.0–3.0)

## 2018-09-09 NOTE — Progress Notes (Signed)
Location:  Occupational psychologist of Service:  Clinic (12)  Provider: Teige Rountree L. Mariea Clonts, D.O., C.M.D.  Code Status: DNR Goals of Care:  Advanced Directives 04/15/2018  Does Patient Have a Medical Advance Directive? Yes  Type of Paramedic of [place];Living will;Out of facility DNR (pink MOST or yellow form)  Does patient want to make changes to medical advance directive? No - Patient declined  Copy of [place] in Chart? Yes  Pre-existing out of facility DNR order (yellow form or pink MOST form) Yellow form placed in chart (order not valid for inpatient use)     Chief Complaint  Patient presents with  . Medical Management of Chronic Issues    follow-up, high risk fall  . Anticoagulation    PT/INR    HPI: Patient is a 83 y.o. male seen today for medical management of chronic diseases and routine INR check.    He fell in his doorway last night.  Discussed that BP ideally should be something like 110-140 with his orthostatic hypotension.  He bumped his right elbow during the fall.  It's been cleaned and dressed with a protective dressing.  No recent clear seizure events.  He had some shaking of his arms with the fall last evening, but was awake and speaking the entire time.  Ativan was given.  This time he also had no aura before this event like he does with his seizures.  He has occasional jerking episodes.  If he goes into [employer] to read, his head will nod.  He takes a deep yawn.  He has suspected multisystem atrophy.    He has insomnia and gets up often to urinate.    He has shortness of breath occasionally and congestion with yellow mucus, but able to get it out with blowing.    He has glaucoma--pressures have been under control with current drops.  His ophthalmologist who did his cataract surgery--Dr. Gershon Crane.  Does not need his eyeglasses to read the paper since then.  He has grover's disease--uses  the cream which knocks it out in a day.  Had HMR lotion before.  It will spread if not treated quickly.  He has to immediately change after going to the gym and stay clean to prevent.    He has afib on flecainide.  He's been out of sinus rhythm.  Has not seen cardiology in a long time.  He thinks that is what is causing shortness of breath for him.    Dr. Jannifer Franklin has written that he will check his dilantin level per the notes today.    PSA has historically run 8-10 per pt.  [facility] had stopped checking due to updated guidelines.  He has no new symptoms in this regard.    Past Medical History:  Diagnosis Date  . Abnormality of gait 12/21/2013  . Atrial fibrillation ([place])   . Blind left eye   . Cardiac arrest ([place])   . Glaucoma   . Hypercholesteremia   . Hyperlipidemia   . Mild left ventricular systolic dysfunction   . PSA elevation   . Right frontal lobe lesion   . Seizures ([place])   . Urinary urgency     Past Surgical History:  Procedure Laterality Date  . CARDIAC DEFIBRILLATOR PLACEMENT    . cataract surgery    . COLONOSCOPY    . CRANIOTOMY Right    right anterior temporal resection  . defibrillator replaced  March 2016  .  IMPLANTATION VAGAL NERVE STIMULATOR  June 2016  . NASAL SINUS SURGERY    . TONSILLECTOMY     age 76  . VASECTOMY  1985    Allergies  Allergen Reactions  . Depakote [Divalproex Sodium] Other (See Comments)    Arthralgias     Outpatient Encounter Medications as of 09/09/2018  Medication Sig  . ALPRAZolam (XANAX) 0.25 MG tablet Take 1 tablet (0.25 mg total) by mouth 2 (two) times daily.  . bimatoprost (LUMIGAN) 0.01 % SOLN Place 1 drop into both eyes at bedtime.   . brimonidine-timolol (COMBIGAN) 0.2-0.5 % ophthalmic solution Place 1 drop into both eyes every 12 (twelve) hours.   . cholecalciferol (VITAMIN D) 400 units TABS tablet Take 400 Units by mouth 2 (two) times daily.  . finasteride (PROSCAR) 5 MG tablet Take 5 mg by mouth daily.   . flecainide  (TAMBOCOR) 50 MG tablet Take 50 mg by mouth 2 (two) times daily.  . fluocinonide ointment (LIDEX) 3.53 % Apply 1 application topically 2 (two) times daily as needed (on rash /skin folds, not for face).   Marland Kitchen lacosamide (VIMPAT) 200 MG TABS tablet Take 1 tablet (200 mg total) by mouth 2 (two) times daily.  Marland Kitchen levETIRAcetam (KEPPRA) 750 MG tablet Take 2 tablets (1,500 mg total) by mouth 2 (two) times daily.  Marland Kitchen LORazepam (ATIVAN) 2 MG tablet Take 2 mg by mouth daily as needed for seizure.  . phenytoin (DILANTIN) 100 MG ER capsule Take 2 capsules (200 mg total) by mouth 2 (two) times daily.  . phenytoin (DILANTIN) 30 MG ER capsule Take 30 mg by mouth daily. Reduce dilantin level to 30 mg daily and recheck in 2 weeks.   . sodium chloride 1 g tablet Take 1 g by mouth daily.  Marland Kitchen triamcinolone cream (KENALOG) 0.1 % Apply 1 application topically 2 (two) times daily as needed (for flares).   . warfarin (COUMADIN) 2.5 MG tablet Take 1 tablet (2.5 mg) on Tues, Thurs, Sat, Sun, Take 1 and a half tablets ( equals 3.75 mg) on Mon, Wed, Fri.   No facility-administered encounter medications on file as of 09/09/2018.     Review of Systems:  Review of Systems  Constitutional: Negative for chills, fever, malaise/fatigue and weight loss.  HENT: Negative for congestion and hearing loss.   Eyes: Negative for blurred vision.  Respiratory: Negative for cough and shortness of breath.   Cardiovascular: Negative for chest pain, palpitations and leg swelling.       Has ICD and not established with cardiology here--pt thought he was going to go back and forth here and [place] so did not initially establish  Gastrointestinal: Negative for abdominal pain, blood in stool, constipation and melena.  Genitourinary: Negative for dysuria.  Musculoskeletal: Positive for falls. Negative for joint pain.  Skin: Positive for rash.  Neurological: Positive for dizziness. Negative for seizures, loss of consciousness and weakness.   Endo/Heme/Allergies: Bruises/bleeds easily.  Psychiatric/Behavioral: Positive for memory loss. Negative for depression and hallucinations. The patient is not nervous/anxious and does not have insomnia.     Health Maintenance  Topic Date Due  . TETANUS/TDAP  12/06/2025  . INFLUENZA VACCINE  Completed  . PNA vac Low Risk Adult  Completed    Physical Exam: Vitals:   09/09/18 1503  BP: 130/80  Pulse: 75  Temp: 98.1 F (36.7 C)  TempSrc: Oral  SpO2: 93%  Weight: 175 lb (79.4 kg)  Height: 5\' 8"  (1.727 m)   Body mass index is 26.61  kg/m. Physical Exam Vitals signs and nursing note reviewed.  Constitutional:      General: He is not in acute distress.    Appearance: Normal appearance. He is not toxic-appearing.  HENT:     Head: Normocephalic and atraumatic.  Cardiovascular:     Rate and Rhythm: Normal rate and regular rhythm.     Pulses: Normal pulses.     Heart sounds: Normal heart sounds.  Pulmonary:     Effort: Pulmonary effort is normal.     Breath sounds: Normal breath sounds.  Abdominal:     General: Bowel sounds are normal.  Musculoskeletal: Normal range of motion.     Comments: Walks with walker, shuffles, very unsteady appearing; festinates   Skin:    General: Skin is warm and dry.     Comments: Skin tear to right elbow  Neurological:     Mental Status: He is alert. Mental status is at baseline.     Motor: No weakness.     Gait: Gait abnormal.     Labs reviewed: Basic Metabolic Panel: Recent Labs    03/04/18 1311 03/27/18 0841  NA 142 139  K 4.9 4.4  CL 102 105  CO2 25 27  GLUCOSE 87 94  BUN 17 12  CREATININE 0.86 0.89  CALCIUM 9.0 8.9   Liver Function Tests: Recent Labs    03/04/18 1311 03/27/18 1154  AST 29  --   ALT 25  --   ALKPHOS 128*  --   BILITOT 0.2  --   PROT 7.3  --   ALBUMIN 4.6 3.8   No results for input(s): LIPASE, AMYLASE in the last 8760 hours. No results for input(s): AMMONIA in the last 8760 hours. CBC: Recent Labs     03/04/18 1311 03/27/18 0841  WBC 6.2 4.9  NEUTROABS 3.6 2.6  HGB 12.3* 11.8*  HCT 35.6* 36.5*  MCV 92 96.6  PLT 217 193   Lipid Panel: No results for input(s): CHOL, HDL, LDLCALC, TRIG, CHOLHDL, LDLDIRECT in the last 8760 hours. No results found for: HGBA1C  Procedures since last visit: No results found.  Assessment/Plan 1. Hypercoagulability due to atrial fibrillation (HCC) -ongoing, goal INR 2-3 -coumadin was reduced today to Take 1 tablet (2.5 mg) on Tues, Wed, Thurs, Sat, Sun, Take 1 and a half tablets ( equals 3.75 mg) on Mon and Fri. -he previously took 3.75mg  on Wednesdays  -recheck INR in 2 wks in [facility] with [place]  2. Chronic atrial fibrillation -ongoing, rate controlled, also on flecainide antiarrhythmic--no complaints of palpitations - POC INR  3. Refractory epilepsy ([place]) -seems the episode yesterday was orthostatic hypotension causing fall and near syncope NOT a seizure as patient remained fully conscious and had no aura like he typically gets -dilantin to be checked in am with routine screening labs to ensure nothing else is amiss causing him to be dizzy and fall  4. Parkinsonian syndrome associated with symptomatic orthostatic hypotension ([place]) -well know to be an issue of his and reason it was thought he might have multisystem atrophy by his prior neurologist in [place] -he does not want another med to address this issue and it seems it has not been an issue but this one time as I recall since he moved here  5. ICD (implantable cardioverter-defibrillator) in place -needs interrogation, pt needs to establish with [facility] to have this done  6. Status post VNS (vagus nerve stimulator) placement -has been effective with a prior seizure--swipe over it to stop  the seizure by stimulating vagus nerve--pt demonstrated it for me (he was confusing his icd and this when we were discussing the icd testing that's necessary)  Labs/tests ordered:   Orders Placed This  Encounter  Procedures  . POC INR  cbc, cmp, flp, plus dilantin level (per Dr. Jannifer Franklin)   Next appt:  2 wks INR check with [facility] in am so he can take his coumadin on time  Talik Casique L. Yalitza Teed, D.O. [facility] [address], [place] [address] Cell Phone (Mon-Fri 8am-5pm):  [phone] On Call:  [phone] & follow prompts after 5pm & weekends Office Phone:  [phone] Office Fax:  [phone]

## 2018-09-09 NOTE — Telephone Encounter (Signed)
The patient apparently had a recent seizure, he fell to the floor with this.  In early January, he had a toxic Dilantin level, we will recheck another Dilantin level at this time.  Readjust medications if needed.

## 2018-09-10 ENCOUNTER — Telehealth: Payer: Self-pay

## 2018-09-10 DIAGNOSIS — R4182 Altered mental status, unspecified: Secondary | ICD-10-CM | POA: Diagnosis not present

## 2018-09-10 DIAGNOSIS — R569 Unspecified convulsions: Secondary | ICD-10-CM | POA: Diagnosis not present

## 2018-09-10 DIAGNOSIS — G40911 Epilepsy, unspecified, intractable, with status epilepticus: Secondary | ICD-10-CM | POA: Diagnosis not present

## 2018-09-10 DIAGNOSIS — R410 Disorientation, unspecified: Secondary | ICD-10-CM | POA: Diagnosis not present

## 2018-09-10 DIAGNOSIS — E785 Hyperlipidemia, unspecified: Secondary | ICD-10-CM | POA: Diagnosis not present

## 2018-09-10 DIAGNOSIS — D649 Anemia, unspecified: Secondary | ICD-10-CM | POA: Diagnosis not present

## 2018-09-10 DIAGNOSIS — G4089 Other seizures: Secondary | ICD-10-CM | POA: Diagnosis not present

## 2018-09-10 NOTE — Telephone Encounter (Signed)
I called and talk with Cameron Potts.  The patient has a toxic Dilantin level, he is to stop the 30 mg Dilantin capsule, recheck blood work in 2 weeks.  Toxic Dilantin levels can result in seizures.

## 2018-09-10 NOTE — Telephone Encounter (Signed)
ERROR

## 2018-09-10 NOTE — Telephone Encounter (Signed)
Chantea/[facility] [phone] called to advise Dilantin levels are 27.3 drawn this morning. Please call to advise

## 2018-09-15 ENCOUNTER — Encounter: Payer: Self-pay | Admitting: Internal Medicine

## 2018-09-23 ENCOUNTER — Other Ambulatory Visit: Payer: Self-pay

## 2018-09-23 ENCOUNTER — Telehealth: Payer: Self-pay

## 2018-09-23 ENCOUNTER — Ambulatory Visit: Payer: Medicare Other | Admitting: Internal Medicine

## 2018-09-23 VITALS — BP 128/70 | HR 72 | Temp 97.3°F | Ht 68.0 in

## 2018-09-23 DIAGNOSIS — G4089 Other seizures: Secondary | ICD-10-CM | POA: Diagnosis not present

## 2018-09-23 DIAGNOSIS — D6869 Other thrombophilia: Secondary | ICD-10-CM | POA: Diagnosis not present

## 2018-09-23 DIAGNOSIS — I4891 Unspecified atrial fibrillation: Secondary | ICD-10-CM | POA: Diagnosis not present

## 2018-09-23 DIAGNOSIS — R569 Unspecified convulsions: Secondary | ICD-10-CM | POA: Diagnosis not present

## 2018-09-23 LAB — POCT INR: INR: 3.2 — AB (ref 2.0–3.0)

## 2018-09-23 NOTE — Telephone Encounter (Signed)
I spoke with patient and he doesn't have any of the equipment to do a virtual visit but he would like to still do a telephone visit. He states that he had blood work this morning and they were supposed to be checking his phenytoin levels and sending you the results. (I only see an order in epic for an INR.) The best contact number is his apartment phone, [phone].

## 2018-09-23 NOTE — Telephone Encounter (Signed)
I called and spoke with patient's wife. She informed me that patient is a resident at [facility] and provided me with multiple ways to contact him.  Nurse station: [phone] Pts apartment phone: [phone] Pts cell phone: [phone] Pts email (best): [email or web address]  I have sent patient an email explaining our current Covid-19 protocol for office visits and explained how the virtual visit would work and if he didn't have access to a web cam, we could do a telephone visit. I asked that he get back with me and let me know what he would like to do. If I do not get a message back by lunch time, I will contact the [facility] nurse station.

## 2018-09-23 NOTE — Patient Instructions (Signed)
Decrease coumadin to 2.5mg  all days except 3.75mg  on Monday Recheck INR in [facility] with CMA in 2 weeks.  Appointment scheduled for 10/07/2018 @ 8:30 am. Assisted Living Nurse will orchestrate bringing patient to [facility] clinic area.

## 2018-09-24 ENCOUNTER — Encounter: Payer: Self-pay | Admitting: Neurology

## 2018-09-24 ENCOUNTER — Ambulatory Visit (INDEPENDENT_AMBULATORY_CARE_PROVIDER_SITE_OTHER): Payer: Medicare Other | Admitting: Neurology

## 2018-09-24 ENCOUNTER — Other Ambulatory Visit: Payer: Self-pay

## 2018-09-24 ENCOUNTER — Telehealth: Payer: Self-pay | Admitting: Neurology

## 2018-09-24 DIAGNOSIS — R269 Unspecified abnormalities of gait and mobility: Secondary | ICD-10-CM

## 2018-09-24 DIAGNOSIS — G40211 Localization-related (focal) (partial) symptomatic epilepsy and epileptic syndromes with complex partial seizures, intractable, with status epilepticus: Secondary | ICD-10-CM

## 2018-09-24 DIAGNOSIS — Z9689 Presence of other specified functional implants: Secondary | ICD-10-CM | POA: Diagnosis not present

## 2018-09-24 MED ORDER — PHENYTOIN SODIUM EXTENDED 30 MG PO CAPS: 30.0000 mg | ORAL_CAPSULE | ORAL | Status: DC

## 2018-09-24 NOTE — Telephone Encounter (Signed)
Information noted, managing his Dilantin is quite difficult, it is extremely difficult to get him between 37 and 20, he goes from 15 to 25.  Elevated Dilantin levels may also result in seizures.

## 2018-09-24 NOTE — Progress Notes (Signed)
     Virtual Visit via Telephone Note  I connected with Cameron Potts on 09/24/18 at 12:00 PM EDT by telephone and verified that I am speaking with the correct person using two identifiers.   I discussed the limitations, risks, security and privacy concerns of performing an evaluation and management service by telephone and the availability of in person appointments. I also discussed with the patient that there may be a patient responsible charge related to this service. The patient expressed understanding and agreed to proceed.   History of Present Illness: Cameron Potts is an 83 year old right-handed white male with a history of intractable seizures.  Patient currently is on 3 different anti-epileptic medications including Dilantin, Keppra, and Vimpat.  The patient is had epilepsy surgery in the past, he has a vagal nerve stimulator in place.  The patient has had wide variations in Dilantin levels with minimal dose adjustments.  He recently was toxic on his Dilantin with a level of 27, the 30 mg capsule was discontinued and the level dropped to 15.9.  This recent level was done yesterday, it was not a trough level, done 2 and half hours after dosing in the morning.  The patient is currently tolerating medication well, during episodes of Dilantin toxicity he may have gait instability and falls.  He is on Coumadin.  The patient resides at [facility].  His last recorded seizure event was on 27 March 2018.  The patient reports no other new medical issues that have come up since last seen, he does have some occasional episodes of orthostasis.   Observations/Objective: On the telephone interview, the patient is alert and cooperative, he is able to answer questions appropriately.  Speech is well enunciated, not aphasic or dysarthric.  Assessment and Plan: 1.  Intractable seizure disorder  2.  Vagal nerve stimulator placement  3.  Gait disorder  The patient will continue his Dilantin 100 mg capsules,  taking 2 twice daily.  We will add the 30 mg capsule of Dilantin back taking 1 capsule every other day.  We will recheck the Dilantin level in 3 weeks.  The patient will follow-up here in 4 months.  Follow Up Instructions: 72-month follow-up with me.   I discussed the assessment and treatment plan with the patient. The patient was provided an opportunity to ask questions and all were answered. The patient agreed with the plan and demonstrated an understanding of the instructions.   The patient was advised to call back or seek an in-person evaluation if the symptoms worsen or if the condition fails to improve as anticipated.  I provided 22 minutes of non-face-to-face time during this encounter.   Kathrynn Ducking, MD

## 2018-09-24 NOTE — Telephone Encounter (Signed)
FYI

## 2018-09-24 NOTE — Telephone Encounter (Signed)
Pt.'s wife called wanting to give [place] a heads up that since she will not be there for the Tele Visit today she wanted provider to know that the pt had his blood drawn and his dilantin level was 15.9 and this was 2 1/2 hours after his medications. He is to be between 18-20 and the last time he was down to 15 he had a seizure. Please advise.

## 2018-09-27 NOTE — Progress Notes (Signed)
Encounter not closed for some reason after INR check and orders written in AL chart.  Decrease coumadin to 2.5mg  on all days except 3.75mg  on Monday due to INR 3.2 despite prior reduction of coumadin (was on 3.75mg  twice a week, 1.5mg  all other days).  Likely was affected by dilantin changes.

## 2018-09-28 NOTE — Addendum Note (Signed)
Addended by: Hollace Kinnier L on: 09/28/2018 12:44 PM   Modules accepted: Level of Service

## 2018-10-01 ENCOUNTER — Other Ambulatory Visit: Payer: Self-pay | Admitting: Adult Health

## 2018-10-01 MED ORDER — ALPRAZOLAM 0.25 MG PO TABS: 0.2500 mg | ORAL_TABLET | Freq: Two times a day (BID) | ORAL | 2 refills | Status: DC

## 2018-10-05 ENCOUNTER — Emergency Department (HOSPITAL_COMMUNITY)
Admission: EM | Admit: 2018-10-05 | Discharge: 2018-10-05 | Disposition: A | Discharge status: Skilled Nursing Facility | Payer: Medicare Other | Attending: Emergency Medicine | Admitting: Emergency Medicine

## 2018-10-05 ENCOUNTER — Other Ambulatory Visit: Payer: Self-pay

## 2018-10-05 ENCOUNTER — Encounter (HOSPITAL_COMMUNITY): Payer: Self-pay

## 2018-10-05 ENCOUNTER — Emergency Department (HOSPITAL_COMMUNITY): Payer: Medicare Other

## 2018-10-05 DIAGNOSIS — Z87891 Personal history of nicotine dependence: Secondary | ICD-10-CM | POA: Diagnosis not present

## 2018-10-05 DIAGNOSIS — G40802 Other epilepsy, not intractable, without status epilepticus: Secondary | ICD-10-CM | POA: Diagnosis not present

## 2018-10-05 DIAGNOSIS — R410 Disorientation, unspecified: Secondary | ICD-10-CM | POA: Diagnosis not present

## 2018-10-05 DIAGNOSIS — G20 Parkinson's disease: Secondary | ICD-10-CM | POA: Insufficient documentation

## 2018-10-05 DIAGNOSIS — G40901 Epilepsy, unspecified, not intractable, with status epilepticus: Secondary | ICD-10-CM | POA: Diagnosis not present

## 2018-10-05 DIAGNOSIS — Z79899 Other long term (current) drug therapy: Secondary | ICD-10-CM | POA: Insufficient documentation

## 2018-10-05 DIAGNOSIS — R4182 Altered mental status, unspecified: Secondary | ICD-10-CM | POA: Diagnosis not present

## 2018-10-05 DIAGNOSIS — M2550 Pain in unspecified joint: Secondary | ICD-10-CM | POA: Diagnosis not present

## 2018-10-05 DIAGNOSIS — R404 Transient alteration of awareness: Secondary | ICD-10-CM | POA: Diagnosis not present

## 2018-10-05 DIAGNOSIS — G40909 Epilepsy, unspecified, not intractable, without status epilepticus: Secondary | ICD-10-CM | POA: Diagnosis not present

## 2018-10-05 DIAGNOSIS — R569 Unspecified convulsions: Secondary | ICD-10-CM | POA: Diagnosis not present

## 2018-10-05 DIAGNOSIS — Z7401 Bed confinement status: Secondary | ICD-10-CM | POA: Diagnosis not present

## 2018-10-05 LAB — COMPREHENSIVE METABOLIC PANEL
ALT: 23 U/L (ref 0–44)
AST: 29 U/L (ref 15–41)
Albumin: 4.6 g/dL (ref 3.5–5.0)
Alkaline Phosphatase: 171 U/L — ABNORMAL HIGH (ref 38–126)
Anion gap: 10 (ref 5–15)
BUN: 20 mg/dL (ref 8–23)
CO2: 24 mmol/L (ref 22–32)
Calcium: 9.2 mg/dL (ref 8.9–10.3)
Chloride: 102 mmol/L (ref 98–111)
Creatinine, Ser: 0.91 mg/dL (ref 0.61–1.24)
GFR calc Af Amer: >60 mL/min (ref >60)
GFR calc non Af Amer: >60 mL/min (ref >60)
Glucose, Bld: 110 mg/dL — ABNORMAL HIGH (ref 70–99)
Potassium: 4.1 mmol/L (ref 3.5–5.1)
Sodium: 136 mmol/L (ref 135–145)
Total Bilirubin: 0.3 mg/dL (ref 0.3–1.2)
Total Protein: 7.9 g/dL (ref 6.5–8.1)

## 2018-10-05 LAB — PROTIME-INR
INR: 2.1 — ABNORMAL HIGH (ref 0.8–1.2)
Prothrombin Time: 23.2 seconds — ABNORMAL HIGH (ref 11.4–15.2)

## 2018-10-05 LAB — CBC WITH DIFFERENTIAL/PLATELET
Abs Immature Granulocytes: 0.04 10*3/uL (ref 0.00–0.07)
Basophils Absolute: 0.1 10*3/uL (ref 0.0–0.1)
Basophils Relative: 1 %
Eosinophils Absolute: 0.6 10*3/uL — ABNORMAL HIGH (ref 0.0–0.5)
Eosinophils Relative: 10 %
HCT: 39.5 % (ref 39.0–52.0)
Hemoglobin: 14.2 g/dL (ref 13.0–17.0)
Immature Granulocytes: 1 %
Lymphocytes Relative: 24 %
Lymphs Abs: 1.5 10*3/uL (ref 0.7–4.0)
MCH: 34 pg (ref 26.0–34.0)
MCHC: 35.9 g/dL (ref 30.0–36.0)
MCV: 94.5 fL (ref 80.0–100.0)
Monocytes Absolute: 0.6 10*3/uL (ref 0.1–1.0)
Monocytes Relative: 9 %
Neutro Abs: 3.3 10*3/uL (ref 1.7–7.7)
Neutrophils Relative %: 55 %
Platelets: 227 10*3/uL (ref 150–400)
RBC: 4.18 MIL/uL — ABNORMAL LOW (ref 4.22–5.81)
RDW: 12.8 % (ref 11.5–15.5)
WBC: 6.1 10*3/uL (ref 4.0–10.5)
nRBC: 0 % (ref 0.0–0.2)

## 2018-10-05 LAB — CBG MONITORING, ED: Glucose-Capillary: 82 mg/dL (ref 70–99)

## 2018-10-05 LAB — PHENYTOIN LEVEL, TOTAL: Phenytoin Lvl: 18.4 ug/mL (ref 10.0–20.0)

## 2018-10-05 MED ORDER — SODIUM CHLORIDE 0.9 % IV SOLN
INTRAVENOUS | Status: DC
  Administered 2018-10-05: 21:00:00 via INTRAVENOUS

## 2018-10-05 MED ORDER — LEVETIRACETAM IN NACL 1000 MG/100ML IV SOLN
1000.0000 mg | Freq: Once | INTRAVENOUS | Status: AC
  Administered 2018-10-05: 1000 mg via INTRAVENOUS
  Filled 2018-10-05: qty 100

## 2018-10-05 NOTE — ED Notes (Signed)
Called [facility] 

## 2018-10-05 NOTE — ED Notes (Signed)
Returned from CT.

## 2018-10-05 NOTE — ED Notes (Signed)
Patient transported to CT via stretcher.

## 2018-10-05 NOTE — ED Provider Notes (Signed)
[facility] [place] DEPT Provider Note   CSN: [id number] Arrival date & time: 10/05/18  1955    History   Chief Complaint Chief Complaint  Patient presents with  . Seizures    HPI Cameron Potts is a 83 y.o. male.     83 year old male with history of seizure disorder presents after having 2 witnessed seizures at his nursing facility.  Patient normally has left-sided weakness after seizures which he is currently experiencing.  Was treated with 2 mg of Ativan which stopped his symptoms.  He states he feels back to his baseline.  Denies any severe headache.  Has not been nauseated.  Per EMS, blood sugar was 82.  Patient denies any recent illness.     Past Medical History:  Diagnosis Date  . Abnormality of gait 12/21/2013  . Atrial fibrillation ([place])   . Blind left eye   . Cardiac arrest ([place])   . Glaucoma   . Hypercholesteremia   . Hyperlipidemia   . Mild left ventricular systolic dysfunction   . PSA elevation   . Right frontal lobe lesion   . Seizures ([place])   . Urinary urgency     Patient Active Problem List   Diagnosis Date Noted  . Hypercoagulability due to atrial fibrillation ([place]) 03/25/2018  . Primary open-angle glaucoma, bilateral, severe stage 03/17/2018  . ICD (implantable cardioverter-defibrillator) in place 03/13/2018  . Parkinsonian syndrome associated with symptomatic orthostatic hypotension ([place]) 03/13/2018  . Refractory epilepsy ([place]) 03/13/2018  . Status post VNS (vagus nerve stimulator) placement 03/13/2018  . 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 Neuropathy, peripheral 12/14/2017  . Hyponatremia 07/12/2017  . Presence of intraocular lens 04/23/2017  . Status epilepticus ([place]) 01/05/2017  . Chronic atrial fibrillation 01/04/2017  . Adjustment disorder with anxious mood 03/25/2014  . Age-related nuclear cataract of both eyes 03/25/2014  . 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  . Hyperlipidemia 03/25/2014  . Carcinoma in  situ of prostate 02/08/2014  . Other localized visual field defect, bilateral 02/02/2014  . Abnormality of gait 12/21/2013  . Complex partial seizure ([place]) 04/11/2013  . Osteopenia 12/04/2011  . Vitamin D deficiency 12/04/2011    Past Surgical History:  Procedure Laterality Date  . CARDIAC DEFIBRILLATOR PLACEMENT    . cataract surgery    . COLONOSCOPY    . CRANIOTOMY Right    right anterior temporal resection  . defibrillator replaced  March 2016  . IMPLANTATION VAGAL NERVE STIMULATOR  June 2016  . NASAL SINUS SURGERY    . TONSILLECTOMY     age 87  . [place] Medications    Prior to Admission medications   Medication Sig Start Date End Date Taking? Authorizing Provider  acetaminophen (TYLENOL) 650 MG CR tablet Take 650 mg by mouth every 4 (four) hours as needed for pain (up to 72 hours).    [provider]  ALPRAZolam Duanne Moron) 0.25 MG tablet Take 1 tablet (0.25 mg total) by mouth 2 (two) times daily. 10/01/18   Royal Hawthorn, NP  bimatoprost (LUMIGAN) 0.01 % SOLN Place 1 drop into both eyes at bedtime.  11/03/17   [provider]  brimonidine-timolol (COMBIGAN) 0.2-0.5 % ophthalmic solution Place 1 drop into both eyes every 12 (twelve) hours.  01/05/18   [provider]  cholecalciferol (VITAMIN D) 400 units TABS tablet Take 400 Units by mouth 2 (two) times daily.    [provider]  finasteride (PROSCAR) 5 MG tablet  Take 5 mg by mouth daily.     [provider]  flecainide (TAMBOCOR) 50 MG tablet Take 50 mg by mouth 2 (two) times daily.    [provider]  fluocinonide ointment (LIDEX) 2.22 % Apply 1 application topically 2 (two) times daily as needed (on rash /skin folds, not for face).     [provider]  lacosamide (VIMPAT) 200 MG TABS tablet Take 1 tablet (200 mg total) by mouth 2 (two) times daily. 06/01/18 06/02/19  Royal Hawthorn, NP  levETIRAcetam (KEPPRA) 750 MG tablet Take 2 tablets  (1,500 mg total) by mouth 2 (two) times daily. 06/08/18   Reed, Tiffany L, DO  LORazepam (ATIVAN) 2 MG/ML injection Inject 2 mg into the muscle as needed for seizure. NOT TO BE USED FOR BEHAVIORS    [provider]  phenytoin (DILANTIN) 100 MG ER capsule Take 2 capsules (200 mg total) by mouth 2 (two) times daily. 03/09/18   Kathrynn Ducking, MD  phenytoin (DILANTIN) 30 MG ER capsule Take 1 capsule (30 mg total) by mouth every other day. 09/24/18   Kathrynn Ducking, MD  polyethylene glycol [facility] / Floria Raveling) packet Take 17 g by mouth daily as needed for mild constipation.    [provider]  sodium chloride 1 g tablet Take 1 g by mouth daily.    [provider]  triamcinolone cream (KENALOG) 0.1 % Apply 1 application topically 2 (two) times daily as needed (for flares).     [provider]  warfarin (COUMADIN) 2.5 MG tablet Take 1 tablet (2.5 mg) on Tues, Wed, Thurs, Sat, Sun, Take 1 and a half tablets ( equals 3.75 mg) on Mon and Fri. 01/27/18   [provider]    Family History Family History  Problem Relation Age of Onset  . Cancer - Lung Mother   . Cancer - Prostate Father     Social History Social History   Tobacco Use  . Smoking status: Former Smoker    Packs/day: 0.75    Years: 10.00    Pack years: 7.50    Types: Cigarettes  . Smokeless tobacco: Never Used  . Tobacco comment: quit 1960s  Substance Use Topics  . Alcohol use: No    Comment: former beer drinker  . Drug use: No     Allergies   Depakote [divalproex sodium]   Review of Systems Review of Systems  All other systems reviewed and are negative.    Physical Exam Updated Vital Signs BP (!) 172/81 (BP Location: Left Arm)   Pulse 71   Temp 98.4 F (36.9 C) (Oral)   Resp 18   Ht 1.727 m (5\' 8" )   SpO2 98%   BMI 26.61 kg/m   Physical Exam Vitals signs and nursing note reviewed.  Constitutional:      General: He is not in acute distress.    Appearance:  Normal appearance. He is well-developed. He is not toxic-appearing.  HENT:     Head: Normocephalic and atraumatic.  Eyes:     General: Lids are normal.     Conjunctiva/sclera: Conjunctivae normal.     Pupils: Pupils are equal, round, and reactive to light.  Neck:     Musculoskeletal: Normal range of motion and neck supple.     Thyroid: No thyroid mass.     Trachea: No tracheal deviation.  Cardiovascular:     Rate and Rhythm: Normal rate and regular rhythm.     Heart sounds: Normal heart sounds.  No murmur. No gallop.   Pulmonary:     Effort: Pulmonary effort is normal. No respiratory distress.     Breath sounds: Normal breath sounds. No stridor. No decreased breath sounds, wheezing, rhonchi or rales.  Abdominal:     General: Bowel sounds are normal. There is no distension.     Palpations: Abdomen is soft.     Tenderness: There is no abdominal tenderness. There is no rebound.  Musculoskeletal: Normal range of motion.        General: No tenderness.  Skin:    General: Skin is warm and dry.     Findings: No abrasion or rash.  Neurological:     Mental Status: He is oriented to person, place, and time. He is lethargic.     GCS: GCS eye subscore is 4. GCS verbal subscore is 5. GCS motor subscore is 6.     Cranial Nerves: No dysarthria.     Sensory: No sensory deficit.     Motor: No seizure activity.     Comments: Left upper extremity 2 of 5 strength left lower extremity 2 out of 5 strength.  Right upper and right lower extremity normal  Psychiatric:        Attention and Perception: Attention normal.        Mood and Affect: Affect is flat.        Speech: Speech is delayed.        Behavior: Behavior is withdrawn.      ED Treatments / Results  Labs (all labs ordered are listed, but only abnormal results are displayed) Labs Reviewed  CBC WITH DIFFERENTIAL/PLATELET  COMPREHENSIVE METABOLIC PANEL  PROTIME-INR  PHENYTOIN LEVEL, TOTAL    EKG None  Radiology No results found.   Procedures Procedures (including critical care time)  Medications Ordered in ED Medications  0.9 %  sodium chloride infusion (has no administration in time range)  levETIRAcetam (KEPPRA) IVPB 1000 mg/100 mL premix (has no administration in time range)     Initial Impression / Assessment and Plan / ED Course  I have reviewed the triage vital signs and the nursing notes.  Pertinent labs & imaging results that were available during my care of the patient were reviewed by me and considered in my medical decision making (see chart for details).        Patient head CT that was negative here.  Keppra was given IV piggyback as patient missed his evening dose.  Dilantin level is therapeutic.  Electrolytes are reassuring.  Repeat neurological exam is at baseline per patient.  Suspect Todd's paralysis which is since resolved.  Patient stable for discharge back to his facility  Final Clinical Impressions(s) / ED Diagnoses   Final diagnoses:  None    ED Discharge Orders    None       Lacretia Leigh, MD 10/05/18 2212

## 2018-10-05 NOTE — ED Notes (Signed)
Pt changed after incontinence episode, given a new brief and new scrubs pants.

## 2018-10-05 NOTE — ED Notes (Signed)
Updated wife on patient's condition. Wife said patient did not get his night medication, his dilantin, keppra and vimpat. Please call wife back on cell phone at [phone].

## 2018-10-05 NOTE — ED Notes (Signed)
Bed: RESB Expected date:  Expected time:  Means of arrival:  Comments: EMS seizures

## 2018-10-05 NOTE — ED Notes (Signed)
Cameron Potts (primary nurse at [facility]) [phone]

## 2018-10-05 NOTE — ED Notes (Signed)
Attempted to call [facility] x2. Reached a nurse on 3rd attempt.

## 2018-10-05 NOTE — ED Triage Notes (Addendum)
Pt BIB EMS from [facility]. Pt had 2 seizures today at facility and they gave him 2 mg Ativan. Pt is postictal at this time and light sided weakness which is normal after seizures. Pt takes Keppra and has vagal nerve stimulator.   160/80 HR 70 98% RA CBG 118 RR 20

## 2018-10-06 ENCOUNTER — Telehealth: Payer: Self-pay | Admitting: Neurology

## 2018-10-06 MED ORDER — ZONISAMIDE 50 MG PO CAPS: ORAL_CAPSULE | ORAL | Status: DC

## 2018-10-06 MED ORDER — ZONISAMIDE 50 MG PO CAPS: ORAL_CAPSULE | ORAL | 30 refills | Status: DC

## 2018-10-06 NOTE — Telephone Encounter (Signed)
I called and talk with the wife.  The patient apparently had 2 seizures yesterday requiring an emergency room visit.  The patient is on maximum doses of Keppra, Vimpat, and Dilantin.  His Dilantin level was 18.4 in the emergency room.  The cause for the seizures not clear.  Patient has had intractable epilepsy for many years.  We could potentially add a low-dose of Zonegran in the evening hours, this does not interact with Coumadin.  The patient already gets slightly drowsy and spacey off of his a.m. doses of his antiepileptic medications.  The nurses station is [phone].  I called [facility], gave orders for Zonegran 50 mg at night for 2 weeks and go to 100 mg at night.

## 2018-10-07 ENCOUNTER — Encounter: Payer: Self-pay | Admitting: Internal Medicine

## 2018-10-09 DIAGNOSIS — Z20828 Contact with and (suspected) exposure to other viral communicable diseases: Secondary | ICD-10-CM | POA: Diagnosis not present

## 2018-10-11 DIAGNOSIS — Z20828 Contact with and (suspected) exposure to other viral communicable diseases: Secondary | ICD-10-CM | POA: Diagnosis not present

## 2018-10-14 ENCOUNTER — Telehealth: Payer: Self-pay | Admitting: Internal Medicine

## 2018-10-14 DIAGNOSIS — I4891 Unspecified atrial fibrillation: Secondary | ICD-10-CM | POA: Diagnosis not present

## 2018-10-14 DIAGNOSIS — R569 Unspecified convulsions: Secondary | ICD-10-CM | POA: Diagnosis not present

## 2018-10-14 DIAGNOSIS — Z7901 Long term (current) use of anticoagulants: Secondary | ICD-10-CM | POA: Diagnosis not present

## 2018-10-14 MED ORDER — WARFARIN SODIUM 2.5 MG PO TABS: 2.5000 mg | ORAL_TABLET | Freq: Every day | ORAL | 5 refills | Status: DC

## 2018-10-14 NOTE — Telephone Encounter (Signed)
INR is now supratherapeutic at 3.5 after he refused his INR on 4/22.    Decrease coumadin to 2.5mg  daily and recheck INR in 2 weeks in [facility].

## 2018-10-15 ENCOUNTER — Telehealth: Payer: Self-pay | Admitting: Neurology

## 2018-10-15 NOTE — Telephone Encounter (Signed)
Dilantin level done on 14 October 2018 is a level of 17.4.  No change in dosing.

## 2018-10-21 ENCOUNTER — Telehealth: Payer: Self-pay | Admitting: Neurology

## 2018-10-21 NOTE — Telephone Encounter (Signed)
I contacted Chantea from [facility]. She is requesting an order for pt's dilantin level to be re-drawn.  Fax # is [phone].

## 2018-10-21 NOTE — Telephone Encounter (Signed)
Cameron Potts from [facility] called wanting to know if RN has received the paper work that they have sent over and she would like to know when the pt's next Dilantin draw will be. Please advise.

## 2018-10-21 NOTE — Telephone Encounter (Signed)
Currently no indication for repeat Dilantin level, the prior Dilantin level was excellent, there has been no change in dosing since then, no indication for repeat level.

## 2018-10-22 NOTE — Telephone Encounter (Addendum)
I contacted Erin at [phone] and advised to recheck dilantin level in 1 month. Erin verbalized understanding and had no further questions.

## 2018-10-22 NOTE — Telephone Encounter (Signed)
Erin called in about Dilantin lab order. She states she is concerned about lab order not being on hand. She is wondering if Dr. Jannifer Franklin would agree to placing a order for the Dilantin to be drawn in 1 month or so? She states the pt has had seizure's even with dilantin level in the therapeutic range.

## 2018-10-22 NOTE — Telephone Encounter (Signed)
Okay to check the Dilantin level in a month, I do not think it needs to be done at the moment, having therapeutic Dilantin levels does not guarantee that the patient will not have a seizure.

## 2018-10-28 ENCOUNTER — Telehealth: Payer: Self-pay

## 2018-10-28 ENCOUNTER — Non-Acute Institutional Stay: Payer: Medicare Other | Admitting: Internal Medicine

## 2018-10-28 ENCOUNTER — Encounter: Payer: Self-pay | Admitting: Internal Medicine

## 2018-10-28 ENCOUNTER — Other Ambulatory Visit: Payer: Self-pay

## 2018-10-28 VITALS — BP 110/70 | HR 67 | Temp 97.7°F | Ht 68.0 in | Wt 170.0 lb

## 2018-10-28 DIAGNOSIS — I482 Chronic atrial fibrillation, unspecified: Secondary | ICD-10-CM | POA: Diagnosis not present

## 2018-10-28 DIAGNOSIS — I4891 Unspecified atrial fibrillation: Secondary | ICD-10-CM | POA: Diagnosis not present

## 2018-10-28 DIAGNOSIS — G40919 Epilepsy, unspecified, intractable, without status epilepticus: Secondary | ICD-10-CM

## 2018-10-28 DIAGNOSIS — D6869 Other thrombophilia: Secondary | ICD-10-CM | POA: Diagnosis not present

## 2018-10-28 DIAGNOSIS — Z9689 Presence of other specified functional implants: Secondary | ICD-10-CM | POA: Diagnosis not present

## 2018-10-28 LAB — POCT INR: INR: 2.7 (ref 2.0–3.0)

## 2018-10-28 NOTE — Progress Notes (Signed)
Location:  Occupational psychologist of Service:  Clinic (12)  Provider: Ledarius Leeson L. Mariea Clonts, D.O., C.M.D.  Code Status: DNR Goals of Care:  Advanced Directives 10/05/2018  Does Patient Have a Medical Advance Directive? No;Yes  Type of Paramedic of [place];Living will;Out of facility DNR (pink MOST or yellow form)  Does patient want to make changes to medical advance directive? No - Patient declined  Copy of [place] in Chart? -  Would patient like information on creating a medical advance directive? No - Patient declined  Pre-existing out of facility DNR order (yellow form or pink MOST form) -   Chief Complaint  Patient presents with  . Medical Management of Chronic Issues    follow-up  . Anticoagulation    PT/INR    HPI: Patient is a 83 y.o. male seen today for medical management of chronic diseases.    He tells me about being on the new medication for his seizures.  I was aware of this as Dr. Jannifer Franklin and I had been in communication.  He is now on zonegran '50mg'$  (was nightly for a week from 4/21, then 2 at night).  INR 2.7 today.  Continue same dose and recheck in 4 weeks.   No more falls since we met last.  Last one felt to be due to orthostatic hypotension.  Past Medical History:  Diagnosis Date  . Abnormality of gait 12/21/2013  . Atrial fibrillation ([place])   . Blind left eye   . Cardiac arrest ([place])   . Glaucoma   . Hypercholesteremia   . Hyperlipidemia   . Mild left ventricular systolic dysfunction   . PSA elevation   . Right frontal lobe lesion   . Seizures ([place])   . Urinary urgency     Past Surgical History:  Procedure Laterality Date  . CARDIAC DEFIBRILLATOR PLACEMENT    . cataract surgery    . COLONOSCOPY    . CRANIOTOMY Right    right anterior temporal resection  . defibrillator replaced  March 2016  . IMPLANTATION VAGAL NERVE STIMULATOR  June 2016  . NASAL SINUS SURGERY    . TONSILLECTOMY     age 27  . VASECTOMY  1985    Allergies  Allergen Reactions  . Depakote [Divalproex Sodium] Other (See Comments)    Arthralgias     Outpatient Encounter Medications as of 10/28/2018  Medication Sig  . acetaminophen (TYLENOL) 650 MG CR tablet Take 650 mg by mouth every 4 (four) hours as needed for pain (up to 72 hours).  . ALPRAZolam (XANAX) 0.25 MG tablet Take 1 tablet (0.25 mg total) by mouth 2 (two) times daily.  . bimatoprost (LUMIGAN) 0.01 % SOLN Place 1 drop into both eyes at bedtime.   . brimonidine-timolol (COMBIGAN) 0.2-0.5 % ophthalmic solution Place 1 drop into both eyes every 12 (twelve) hours.   . cholecalciferol (VITAMIN D) 400 units TABS tablet Take 400 Units by mouth 2 (two) times daily.  . finasteride (PROSCAR) 5 MG tablet Take 5 mg by mouth daily.   . flecainide (TAMBOCOR) 50 MG tablet Take 50 mg by mouth 2 (two) times daily.  . fluocinonide ointment (LIDEX) 4.23 % Apply 1 application topically 2 (two) times daily as needed (on rash /skin folds, not for face).   Marland Kitchen lacosamide (VIMPAT) 200 MG TABS tablet Take 1 tablet (200 mg total) by mouth 2 (two) times daily.  Marland Kitchen levETIRAcetam (KEPPRA) 750 MG tablet Take 2 tablets (  1,500 mg total) by mouth 2 (two) times daily.  Marland Kitchen LORazepam (ATIVAN) 2 MG/ML injection Inject 2 mg into the muscle as needed for seizure. NOT TO BE USED FOR BEHAVIORS  . phenytoin (DILANTIN) 100 MG ER capsule Take 2 capsules (200 mg total) by mouth 2 (two) times daily.  . phenytoin (DILANTIN) 30 MG ER capsule Take 1 capsule (30 mg total) by mouth every other day.  . polyethylene glycol (MIRALAX / GLYCOLAX) packet Take 17 g by mouth daily as needed for mild constipation.  . sodium chloride 1 g tablet Take 1 g by mouth daily.  Marland Kitchen triamcinolone cream (KENALOG) 0.1 % Apply 1 application topically 2 (two) times daily as needed (for flares).   . warfarin (COUMADIN) 2.5 MG tablet Take 1 tablet (2.5 mg total) by mouth daily at 12 noon.  . zonisamide (ZONEGRAN) 50 MG  capsule 1 capsule at night for a week and then take 2 at night   No facility-administered encounter medications on file as of 10/28/2018.     Review of Systems:  Review of Systems  Constitutional: Negative for chills, fever and malaise/fatigue.  HENT: Positive for hearing loss.   Eyes: Negative for blurred vision.  Respiratory: Negative for cough and shortness of breath.   Cardiovascular: Negative for chest pain, palpitations and leg swelling.  Gastrointestinal: Negative for abdominal pain.  Genitourinary: Negative for dysuria.  Musculoskeletal: Negative for falls and joint pain.  Skin: Negative for itching and rash.  Neurological: Negative for dizziness and loss of consciousness.       Orthostatic hypotension   Endo/Heme/Allergies: Bruises/bleeds easily.  Psychiatric/Behavioral: Positive for memory loss. Negative for depression. The patient is nervous/anxious. The patient does not have insomnia.        Seizures    Health Maintenance  Topic Date Due  . INFLUENZA VACCINE  01/16/2019  . TETANUS/TDAP  12/06/2025  . PNA vac Low Risk Adult  Completed    Physical Exam: Vitals:   10/28/18 1034  BP: 110/70  Pulse: 67  Temp: 97.7 F (36.5 C)  TempSrc: Oral  SpO2: 98%  Weight: 170 lb (77.1 kg)  Height: '5\' 8"'$  (1.727 m)   Body mass index is 25.85 kg/m. Physical Exam Vitals signs reviewed.  Constitutional:      Appearance: Normal appearance.  HENT:     Head: Normocephalic and atraumatic.  Pulmonary:     Effort: Pulmonary effort is normal.  Musculoskeletal: Normal range of motion.  Skin:    General: Skin is warm and dry.     Comments: Excoriation left arm  Neurological:     Mental Status: He is alert.     Comments: Unsteady gait, uses rolling walker     Labs reviewed: Basic Metabolic Panel: Recent Labs    03/04/18 1311 03/27/18 0841 10/05/18 2016  NA 142 139 136  K 4.9 4.4 4.1  CL 102 105 102  CO2 '25 27 24  '$ GLUCOSE 87 94 110*  BUN '17 12 20  '$ CREATININE 0.86  0.89 0.91  CALCIUM 9.0 8.9 9.2   Liver Function Tests: Recent Labs    03/04/18 1311 03/27/18 1154 10/05/18 2016  AST 29  --  29  ALT 25  --  23  ALKPHOS 128*  --  171*  BILITOT 0.2  --  0.3  PROT 7.3  --  7.9  ALBUMIN 4.6 3.8 4.6   No results for input(s): LIPASE, AMYLASE in the last 8760 hours. No results for input(s): AMMONIA in the last 8760 hours.  CBC: Recent Labs    03/04/18 1311 03/27/18 0841 10/05/18 2016  WBC 6.2 4.9 6.1  NEUTROABS 3.6 2.6 3.3  HGB 12.3* 11.8* 14.2  HCT 35.6* 36.5* 39.5  MCV 92 96.6 94.5  PLT 217 193 227   Lipid Panel: No results for input(s): CHOL, HDL, LDLCALC, TRIG, CHOLHDL, LDLDIRECT in the last 8760 hours. No results found for: HGBA1C  Procedures since last visit: Ct Head Wo Contrast  Result Date: 10/05/2018 CLINICAL DATA:  Altered mental status. Seizure. EXAM: CT HEAD WITHOUT CONTRAST TECHNIQUE: Contiguous axial images were obtained from the base of the skull through the vertex without intravenous contrast. COMPARISON:  03/08/2018 FINDINGS: Brain: Unchanged right temporal lobe encephalomalacia. Area of hypoattenuation superior right frontal lobe is also unchanged. No intracranial hemorrhage. No evidence for acute cortical infarct. Vascular: No abnormal hyperdensity of the major intracranial arteries or dural venous sinuses. No intracranial atherosclerosis. Skull: Old right temporal craniotomy. Sinuses/Orbits: Mild paranasal sinus mucosal thickening. No fluid levels. Small left mastoid effusion. The orbits are normal. IMPRESSION: 1. No acute intracranial abnormality. 2. Unchanged right temporal encephalomalacia. Electronically Signed   By: Ulyses Jarred M.D.   On: 10/05/2018 21:51    Assessment/Plan 1. Chronic atrial fibrillation - cont coumadin 2.'5mg'$  po daily - POC INR today was 2.7 -recheck INR with dilantin lab 11/24/18  2. Hypercoagulability due to atrial fibrillation ([place]) -continues on coumadin with INR goal 2-3; avoid supratherapeutic  coumadin given falls and seizures putting him at risk for intracranial bleeds  3. Refractory epilepsy ([place]) -cont current extensive regimen and vagal stimulator -no more seizures since last visit  4. Status post VNS (vagus nerve stimulator) placement -on right chest, swipe device over it when seizure occurs to stop seizure  Labs/tests ordered:  Move dilantin level to 6/9 so we can get INR same day; then plan to meet again July 15 for next INR in [facility]  Next appt:  12/30/2018  Danyka Merlin L. Kamaile Zachow, D.O. [facility] [address], [place] [address] Cell Phone (Mon-Fri 8am-5pm):  [phone] On Call:  [phone] & follow prompts after 5pm & weekends Office Phone:  [phone] Office Fax:  [phone]

## 2018-10-31 DIAGNOSIS — N390 Urinary tract infection, site not specified: Secondary | ICD-10-CM | POA: Diagnosis not present

## 2018-10-31 DIAGNOSIS — E708 Other disorders of aromatic amino-acid metabolism: Secondary | ICD-10-CM | POA: Diagnosis not present

## 2018-11-02 ENCOUNTER — Emergency Department (HOSPITAL_COMMUNITY): Payer: Medicare Other

## 2018-11-02 ENCOUNTER — Inpatient Hospital Stay (HOSPITAL_COMMUNITY)
Admission: EM | Admit: 2018-11-02 | Discharge: 2018-11-04 | DRG: 308 | Disposition: A | Discharge status: Home Health | Payer: Medicare Other | Attending: Internal Medicine | Admitting: Internal Medicine

## 2018-11-02 ENCOUNTER — Other Ambulatory Visit: Payer: Self-pay

## 2018-11-02 ENCOUNTER — Telehealth: Payer: Self-pay | Admitting: Neurology

## 2018-11-02 ENCOUNTER — Encounter (HOSPITAL_COMMUNITY): Payer: Self-pay

## 2018-11-02 ENCOUNTER — Telehealth: Payer: PRIVATE HEALTH INSURANCE | Admitting: Internal Medicine

## 2018-11-02 DIAGNOSIS — R410 Disorientation, unspecified: Secondary | ICD-10-CM | POA: Diagnosis not present

## 2018-11-02 DIAGNOSIS — Z8042 Family history of malignant neoplasm of prostate: Secondary | ICD-10-CM

## 2018-11-02 DIAGNOSIS — J189 Pneumonia, unspecified organism: Secondary | ICD-10-CM | POA: Diagnosis not present

## 2018-11-02 DIAGNOSIS — Z20828 Contact with and (suspected) exposure to other viral communicable diseases: Secondary | ICD-10-CM | POA: Diagnosis not present

## 2018-11-02 DIAGNOSIS — R4182 Altered mental status, unspecified: Secondary | ICD-10-CM | POA: Diagnosis not present

## 2018-11-02 DIAGNOSIS — M8580 Other specified disorders of bone density and structure, unspecified site: Secondary | ICD-10-CM | POA: Diagnosis present

## 2018-11-02 DIAGNOSIS — R404 Transient alteration of awareness: Secondary | ICD-10-CM | POA: Diagnosis not present

## 2018-11-02 DIAGNOSIS — Z66 Do not resuscitate: Secondary | ICD-10-CM | POA: Diagnosis present

## 2018-11-02 DIAGNOSIS — Z888 Allergy status to other drugs, medicaments and biological substances status: Secondary | ICD-10-CM

## 2018-11-02 DIAGNOSIS — G40909 Epilepsy, unspecified, not intractable, without status epilepticus: Secondary | ICD-10-CM

## 2018-11-02 DIAGNOSIS — Z801 Family history of malignant neoplasm of trachea, bronchus and lung: Secondary | ICD-10-CM

## 2018-11-02 DIAGNOSIS — E860 Dehydration: Secondary | ICD-10-CM | POA: Diagnosis present

## 2018-11-02 DIAGNOSIS — E7800 Pure hypercholesterolemia, unspecified: Secondary | ICD-10-CM | POA: Diagnosis present

## 2018-11-02 DIAGNOSIS — E871 Hypo-osmolality and hyponatremia: Secondary | ICD-10-CM | POA: Diagnosis present

## 2018-11-02 DIAGNOSIS — Z8674 Personal history of sudden cardiac arrest: Secondary | ICD-10-CM

## 2018-11-02 DIAGNOSIS — E861 Hypovolemia: Secondary | ICD-10-CM | POA: Diagnosis present

## 2018-11-02 DIAGNOSIS — R0902 Hypoxemia: Secondary | ICD-10-CM | POA: Diagnosis not present

## 2018-11-02 DIAGNOSIS — Z7901 Long term (current) use of anticoagulants: Secondary | ICD-10-CM

## 2018-11-02 DIAGNOSIS — Z95810 Presence of automatic (implantable) cardiac defibrillator: Secondary | ICD-10-CM

## 2018-11-02 DIAGNOSIS — G9341 Metabolic encephalopathy: Secondary | ICD-10-CM | POA: Diagnosis present

## 2018-11-02 DIAGNOSIS — H409 Unspecified glaucoma: Secondary | ICD-10-CM | POA: Diagnosis present

## 2018-11-02 DIAGNOSIS — I482 Chronic atrial fibrillation, unspecified: Principal | ICD-10-CM | POA: Diagnosis present

## 2018-11-02 DIAGNOSIS — R569 Unspecified convulsions: Secondary | ICD-10-CM | POA: Diagnosis not present

## 2018-11-02 DIAGNOSIS — E785 Hyperlipidemia, unspecified: Secondary | ICD-10-CM | POA: Diagnosis present

## 2018-11-02 DIAGNOSIS — R509 Fever, unspecified: Secondary | ICD-10-CM | POA: Diagnosis not present

## 2018-11-02 DIAGNOSIS — Z87891 Personal history of nicotine dependence: Secondary | ICD-10-CM

## 2018-11-02 LAB — URINALYSIS, ROUTINE W REFLEX MICROSCOPIC
Bilirubin Urine: NEGATIVE
Glucose, UA: NEGATIVE mg/dL
Ketones, ur: 20 mg/dL — AB
Leukocytes,Ua: NEGATIVE
Nitrite: NEGATIVE
Protein, ur: NEGATIVE mg/dL
Specific Gravity, Urine: 1.018 (ref 1.005–1.030)
pH: 5 (ref 5.0–8.0)

## 2018-11-02 LAB — CBC WITH DIFFERENTIAL/PLATELET
Abs Immature Granulocytes: 0 10*3/uL (ref 0.00–0.07)
Basophils Absolute: 0.1 10*3/uL (ref 0.0–0.1)
Basophils Relative: 4 %
Eosinophils Absolute: 0.1 10*3/uL (ref 0.0–0.5)
Eosinophils Relative: 3 %
HCT: 30.6 % — ABNORMAL LOW (ref 39.0–52.0)
Hemoglobin: 10.9 g/dL — ABNORMAL LOW (ref 13.0–17.0)
Lymphocytes Relative: 31 %
Lymphs Abs: 0.7 10*3/uL (ref 0.7–4.0)
MCH: 31.5 pg (ref 26.0–34.0)
MCHC: 35.6 g/dL (ref 30.0–36.0)
MCV: 88.4 fL (ref 80.0–100.0)
Monocytes Absolute: 0.6 10*3/uL (ref 0.1–1.0)
Monocytes Relative: 29 %
Neutro Abs: 0.7 10*3/uL — ABNORMAL LOW (ref 1.7–7.7)
Neutrophils Relative %: 33 %
Platelets: 167 10*3/uL (ref 150–400)
RBC: 3.46 MIL/uL — ABNORMAL LOW (ref 4.22–5.81)
RDW: 12 % (ref 11.5–15.5)
WBC: 2.2 10*3/uL — ABNORMAL LOW (ref 4.0–10.5)
nRBC: 0 % (ref 0.0–0.2)
nRBC: 0 /100 WBC

## 2018-11-02 LAB — PROTIME-INR
INR: 3.1 — ABNORMAL HIGH (ref 0.8–1.2)
Prothrombin Time: 31.1 seconds — ABNORMAL HIGH (ref 11.4–15.2)

## 2018-11-02 LAB — BASIC METABOLIC PANEL
Anion gap: 12 (ref 5–15)
BUN: 11 mg/dL (ref 8–23)
CO2: 21 mmol/L — ABNORMAL LOW (ref 22–32)
Calcium: 8.5 mg/dL — ABNORMAL LOW (ref 8.9–10.3)
Chloride: 93 mmol/L — ABNORMAL LOW (ref 98–111)
Creatinine, Ser: 0.79 mg/dL (ref 0.61–1.24)
GFR calc Af Amer: >60 mL/min (ref >60)
GFR calc non Af Amer: >60 mL/min (ref >60)
Glucose, Bld: 108 mg/dL — ABNORMAL HIGH (ref 70–99)
Potassium: 4 mmol/L (ref 3.5–5.1)
Sodium: 126 mmol/L — ABNORMAL LOW (ref 135–145)

## 2018-11-02 LAB — LACTIC ACID, PLASMA
Lactic Acid, Venous: 0.8 mmol/L (ref 0.5–1.9)
Lactic Acid, Venous: 1.1 mmol/L (ref 0.5–1.9)

## 2018-11-02 LAB — OSMOLALITY: Osmolality: 264 mOsm/kg — ABNORMAL LOW (ref 275–295)

## 2018-11-02 LAB — SODIUM, URINE, RANDOM: Sodium, Ur: 86 mmol/L

## 2018-11-02 LAB — SARS CORONAVIRUS 2 BY RT PCR (HOSPITAL ORDER, PERFORMED IN ~~LOC~~ HOSPITAL LAB): SARS Coronavirus 2: NEGATIVE

## 2018-11-02 LAB — OSMOLALITY, URINE: Osmolality, Ur: 627 mOsm/kg (ref 300–900)

## 2018-11-02 LAB — TROPONIN I: Troponin I: <0.03 ng/mL (ref <0.03)

## 2018-11-02 LAB — PROCALCITONIN: Procalcitonin: 0.24 ng/mL

## 2018-11-02 LAB — CK: Total CK: 330 U/L (ref 49–397)

## 2018-11-02 MED ORDER — ACETAMINOPHEN 325 MG PO TABS: 650.0000 mg | ORAL_TABLET | Freq: Four times a day (QID) | ORAL | Status: DC | PRN

## 2018-11-02 MED ORDER — LATANOPROST 0.005 % OP SOLN: 1.0000 [drp] | Freq: Every day | OPHTHALMIC | Status: DC

## 2018-11-02 MED ORDER — TIMOLOL MALEATE 0.5 % OP SOLN
1.0000 [drp] | Freq: Two times a day (BID) | OPHTHALMIC | Status: DC
  Administered 2018-11-02 – 2018-11-04 (×4): 1 [drp] via OPHTHALMIC
  Filled 2018-11-02: qty 5

## 2018-11-02 MED ORDER — BRIMONIDINE TARTRATE 0.2 % OP SOLN
1.0000 [drp] | Freq: Two times a day (BID) | OPHTHALMIC | Status: DC
  Administered 2018-11-02 – 2018-11-04 (×4): 1 [drp] via OPHTHALMIC
  Filled 2018-11-02: qty 5

## 2018-11-02 MED ORDER — LATANOPROST 0.005 % OP SOLN
1.0000 [drp] | Freq: Every day | OPHTHALMIC | Status: DC
  Administered 2018-11-02 – 2018-11-03 (×2): 1 [drp] via OPHTHALMIC
  Filled 2018-11-02: qty 2.5

## 2018-11-02 MED ORDER — WARFARIN - PHARMACIST DOSING INPATIENT: Freq: Every day | Status: DC

## 2018-11-02 MED ORDER — PHENYTOIN SODIUM EXTENDED 30 MG PO CAPS
30.0000 mg | ORAL_CAPSULE | ORAL | Status: DC
  Administered 2018-11-03: 30 mg via ORAL
  Filled 2018-11-02: qty 1

## 2018-11-02 MED ORDER — ACETAMINOPHEN 650 MG RE SUPP: 650.0000 mg | Freq: Four times a day (QID) | RECTAL | Status: DC | PRN

## 2018-11-02 MED ORDER — ACETAMINOPHEN 650 MG RE SUPP
650.0000 mg | Freq: Once | RECTAL | Status: AC
  Administered 2018-11-02: 650 mg via RECTAL
  Filled 2018-11-02: qty 1

## 2018-11-02 MED ORDER — SODIUM CHLORIDE 0.9% FLUSH
3.0000 mL | Freq: Two times a day (BID) | INTRAVENOUS | Status: DC
  Administered 2018-11-03: 3 mL via INTRAVENOUS

## 2018-11-02 MED ORDER — FINASTERIDE 5 MG PO TABS
5.0000 mg | ORAL_TABLET | Freq: Every day | ORAL | Status: DC
  Administered 2018-11-03 – 2018-11-04 (×2): 5 mg via ORAL
  Filled 2018-11-02 (×2): qty 1

## 2018-11-02 MED ORDER — SODIUM CHLORIDE 0.9 % IV SOLN
INTRAVENOUS | Status: DC
  Administered 2018-11-02: 22:00:00 via INTRAVENOUS

## 2018-11-02 MED ORDER — BRIMONIDINE TARTRATE-TIMOLOL 0.2-0.5 % OP SOLN: 1.0000 [drp] | Freq: Two times a day (BID) | OPHTHALMIC | Status: DC

## 2018-11-02 MED ORDER — SODIUM CHLORIDE 0.9 % IV BOLUS
500.0000 mL | Freq: Once | INTRAVENOUS | Status: AC
  Administered 2018-11-02: 500 mL via INTRAVENOUS

## 2018-11-02 MED ORDER — LACOSAMIDE 200 MG PO TABS
200.0000 mg | ORAL_TABLET | Freq: Two times a day (BID) | ORAL | Status: DC
  Administered 2018-11-02 – 2018-11-04 (×4): 200 mg via ORAL
  Filled 2018-11-02 (×4): qty 1

## 2018-11-02 MED ORDER — FLECAINIDE ACETATE 50 MG PO TABS
50.0000 mg | ORAL_TABLET | Freq: Two times a day (BID) | ORAL | Status: DC
  Administered 2018-11-02 – 2018-11-04 (×4): 50 mg via ORAL
  Filled 2018-11-02 (×4): qty 1

## 2018-11-02 MED ORDER — PHENYTOIN SODIUM EXTENDED 100 MG PO CAPS
200.0000 mg | ORAL_CAPSULE | Freq: Two times a day (BID) | ORAL | Status: DC
  Administered 2018-11-02 – 2018-11-04 (×4): 200 mg via ORAL
  Filled 2018-11-02 (×4): qty 2

## 2018-11-02 MED ORDER — WARFARIN SODIUM 2 MG PO TABS
2.0000 mg | ORAL_TABLET | Freq: Once | ORAL | Status: AC
  Administered 2018-11-02: 2 mg via ORAL
  Filled 2018-11-02: qty 1

## 2018-11-02 MED ORDER — ALPRAZOLAM 0.25 MG PO TABS
0.2500 mg | ORAL_TABLET | Freq: Two times a day (BID) | ORAL | Status: DC
  Administered 2018-11-02 – 2018-11-04 (×4): 0.25 mg via ORAL
  Filled 2018-11-02 (×4): qty 1

## 2018-11-02 MED ORDER — LEVETIRACETAM 750 MG PO TABS
1500.0000 mg | ORAL_TABLET | Freq: Two times a day (BID) | ORAL | Status: DC
  Administered 2018-11-02 – 2018-11-04 (×4): 1500 mg via ORAL
  Filled 2018-11-02 (×4): qty 2

## 2018-11-02 NOTE — ED Notes (Signed)
Pt's wife, Opal Sidles, updated on Pt's status and plan of care

## 2018-11-02 NOTE — ED Notes (Signed)
Condom cath applied

## 2018-11-02 NOTE — Progress Notes (Signed)
ANTICOAGULATION CONSULT NOTE - Initial Consult  Pharmacy Consult for warfarin Indication: atrial fibrillation  Allergies  Allergen Reactions  . Depakote [Divalproex Sodium] Other (See Comments)    Arthralgias     Patient Measurements: Height: 5\' 8"  (172.7 cm) Weight: 170 lb (77.1 kg) IBW/kg (Calculated) : 68.4 Heparin Dosing Weight: 77.1 kg  Vital Signs: Temp: 98.3 F (36.8 C) (05/18 1649) Temp Source: Oral (05/18 1649) BP: 138/59 (05/18 1815) Pulse Rate: 62 (05/18 1815)  Labs: Recent Labs    11/02/18 1406  HGB 10.9*  HCT 30.6*  PLT 167  CREATININE 0.79  TROPONINI <0.03    Estimated Creatinine Clearance: 71.3 mL/min (by C-G formula based on SCr of 0.79 mg/dL).   Medical History: Past Medical History:  Diagnosis Date  . Abnormality of gait 12/21/2013  . Atrial fibrillation ([place])   . Blind left eye   . Cardiac arrest ([place])   . Glaucoma   . Hypercholesteremia   . Hyperlipidemia   . Mild left ventricular systolic dysfunction   . PSA elevation   . Right frontal lobe lesion   . Seizures ([place])   . Urinary urgency     Medications:  Scheduled:  . ALPRAZolam  0.25 mg Oral BID  . brimonidine-timolol  1 drop Both Eyes Q12H  . finasteride  5 mg Oral Daily  . flecainide  50 mg Oral BID  . lacosamide  200 mg Oral BID  . latanoprost  1 drop Both Eyes QHS  . latanoprost  1 drop Both Eyes QHS  . levETIRAcetam  1,500 mg Oral BID  . phenytoin  200 mg Oral BID  . [START ON 11/03/2018] phenytoin  30 mg Oral QODAY  . sodium chloride flush  3 mL Intravenous Q12H    Assessment: 84 yom presenting with altered mental status, known history of seizures and atrial fibrillation on warfarin. Last dose on 5/17.   INR today is 3.1. Hgb 10.9, plt 167. No s/sx of bleeding.   PTA regimen is 2.5 mg daily.   Goal of Therapy:  INR 2-3 Monitor platelets by anticoagulation protocol: Yes   Plan:  Order warfarin 2 mg tonight Monitor daily INR, CBC, and for s/sx of bleeding    Antonietta Jewel, PharmD, BCCCP Clinical Pharmacist  Pager: [phone] Phone: [phone] 11/02/2018,7:28 PM

## 2018-11-02 NOTE — ED Notes (Signed)
Pt wife Opal Sidles [phone]

## 2018-11-02 NOTE — Telephone Encounter (Signed)
Patient's wife Opal Sidles calling to add to previous note from [employer] She is very concerned about his rapid decline. He has fallen & couldn't get up. Please contact wife at [phone]

## 2018-11-02 NOTE — Telephone Encounter (Signed)
For now, the Zonegran has been discontinued.

## 2018-11-02 NOTE — ED Provider Notes (Signed)
[facility] EMERGENCY DEPARTMENT Provider Note   CSN: [id number] Arrival date & time: 11/02/18  1254    History   Chief Complaint Chief Complaint  Patient presents with  . Altered Mental Status    HPI Cameron Potts is a 83 y.o. male with a history of seizures, atrial fibrillation on warfarin, ICD, and glaucoma presenting to the ED with altered mental status from [place]. Per staff, he has been confused for the past 3 days and had a fever. Last seen normal at 9 am, he was found to be not talking as he normally does. Normally alert and oriented x4 and ambulatory. Today he was only oriented to name. He does have a history of seizures, last on 4/20 and started on zonegran.     HPI  Past Medical History:  Diagnosis Date  . Abnormality of gait 12/21/2013  . Atrial fibrillation ([place])   . Blind left eye   . Cardiac arrest ([place])   . Glaucoma   . Hypercholesteremia   . Hyperlipidemia   . Mild left ventricular systolic dysfunction   . PSA elevation   . Right frontal lobe lesion   . Seizures ([place])   . Urinary urgency     Patient Active Problem List   Diagnosis Date Noted  . Hypercoagulability due to atrial fibrillation ([place]) 03/25/2018  . Primary open-angle glaucoma, bilateral, severe stage 03/17/2018  . ICD (implantable cardioverter-defibrillator) in place 03/13/2018  . Parkinsonian syndrome associated with symptomatic orthostatic hypotension ([place]) 03/13/2018  . Refractory epilepsy ([place]) 03/13/2018  . Status post VNS (vagus nerve stimulator) placement 03/13/2018  . 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 Neuropathy, peripheral 12/14/2017  . Hyponatremia 07/12/2017  . Presence of intraocular lens 04/23/2017  . Status epilepticus ([place]) 01/05/2017  . Chronic atrial fibrillation 01/04/2017  . Adjustment disorder with anxious mood 03/25/2014  . Age-related nuclear cataract of both eyes 03/25/2014  . 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  . Hyperlipidemia  03/25/2014  . Carcinoma in situ of prostate 02/08/2014  . Other localized visual field defect, bilateral 02/02/2014  . Abnormality of gait 12/21/2013  . Complex partial seizure ([place]) 04/11/2013  . Osteopenia 12/04/2011  . Vitamin D deficiency 12/04/2011    Past Surgical History:  Procedure Laterality Date  . CARDIAC DEFIBRILLATOR PLACEMENT    . cataract surgery    . COLONOSCOPY    . CRANIOTOMY Right    right anterior temporal resection  . defibrillator replaced  March 2016  . IMPLANTATION VAGAL NERVE STIMULATOR  June 2016  . NASAL SINUS SURGERY    . TONSILLECTOMY     age 33  . [place] Medications    Prior to Admission medications   Medication Sig Start Date End Date Taking? Authorizing Provider  acetaminophen (TYLENOL) 650 MG CR tablet Take 650 mg by mouth every 4 (four) hours as needed for pain (up to 72 hours).   Yes [provider]  ALPRAZolam (XANAX) 0.25 MG tablet Take 1 tablet (0.25 mg total) by mouth 2 (two) times daily. 10/01/18  Yes Wert, Margreta Journey, NP  brimonidine-timolol (COMBIGAN) 0.2-0.5 % ophthalmic solution Place 1 drop into both eyes every 12 (twelve) hours.  01/05/18  Yes [provider]  cholecalciferol (VITAMIN D) 400 units TABS tablet Take 400 Units by mouth 2 (two) times daily.   Yes [provider]  finasteride (PROSCAR) 5 MG tablet Take 5 mg by mouth daily.    Yes [provider]  flecainide (TAMBOCOR) 50 MG tablet Take 50 mg by mouth 2 (two) times daily.   Yes [provider]  fluocinonide ointment (LIDEX) 0.34 % Apply 1 application topically 2 (two) times daily as needed (on rash /skin folds, not for face).    Yes [provider]  lacosamide (VIMPAT) 200 MG TABS tablet Take 1 tablet (200 mg total) by mouth 2 (two) times daily. 06/01/18 06/02/19 Yes Wert, Margreta Journey, NP  latanoprost (XALATAN) 0.005 % ophthalmic solution Place 1 drop into both eyes at bedtime.   Yes [provider]  levETIRAcetam (KEPPRA) 750 MG tablet Take 2 tablets (1,500 mg total) by mouth 2 (two) times daily. 06/08/18  Yes Reed, Tiffany L, DO  LORazepam (ATIVAN) 2 MG/ML injection Inject 2 mg into the muscle as needed for seizure. NOT TO BE USED FOR BEHAVIORS   Yes [provider]  phenytoin (DILANTIN) 100 MG ER capsule Take 2 capsules (200 mg total) by mouth 2 (two) times daily. 03/09/18  Yes Kathrynn Ducking, MD  phenytoin (DILANTIN) 30 MG ER capsule Take 1 capsule (30 mg total) by mouth every other day. 09/24/18  Yes Kathrynn Ducking, MD  polyethylene glycol [facility] / Floria Raveling) packet Take 17 g by mouth every other day.    Yes [provider]  sodium chloride 1 g tablet Take 1 g by mouth daily.   Yes [provider]  triamcinolone cream (KENALOG) 0.1 % Apply 1 application topically 2 (two) times daily as needed (for flares).    Yes [provider]  warfarin (COUMADIN) 2.5 MG tablet Take 1 tablet (2.5 mg total) by mouth daily at 12 noon. Patient taking differently: Take 2.5 mg by mouth one time only at 6 PM. Per INR dated 10/28/18 at 12:00 pm 10/14/18  Yes Reed, Tiffany L, DO  bimatoprost (LUMIGAN) 0.01 % SOLN Place 1 drop into both eyes at bedtime.  11/03/17   [provider]  zonisamide (ZONEGRAN) 50 MG capsule 1 capsule at night for a week and then take 2 at night Patient not taking: Reported on 11/02/2018 10/06/18   Kathrynn Ducking, MD    Family History Family History  Problem Relation Age of Onset  . Cancer - Lung Mother   . Cancer - Prostate Father     Social History Social History   Tobacco Use  . Smoking status: Former Smoker    Packs/day: 0.75    Years: 10.00    Pack years: 7.50    Types: Cigarettes  . Smokeless tobacco: Never Used  . Tobacco comment: quit 1960s  Substance Use Topics  . Alcohol use: No    Comment: former beer drinker  . Drug use: No     Allergies   Depakote [divalproex sodium]   Review of Systems  Review of Systems  Reason unable to perform ROS: altered mental status.     Physical Exam Updated Vital Signs BP (!) 133/50   Pulse 67   Temp (!) 101.9 F (38.8 C) (Rectal)   Resp 16   Ht 5\' 8"  (1.727 m)   Wt 77.1 kg   SpO2 98%   BMI 25.85 kg/m   Physical Exam Constitutional:      General: He is not in acute distress.    Appearance: He is not diaphoretic.  HENT:     Head: Normocephalic and atraumatic.  Eyes:     General:        Right eye: No discharge.  Left eye: No discharge.     Conjunctiva/sclera: Conjunctivae normal.  Cardiovascular:     Rate and Rhythm: Normal rate and regular rhythm.     Pulses: Normal pulses.     Heart sounds: Normal heart sounds. No murmur. No friction rub. No gallop.   Pulmonary:     Effort: Pulmonary effort is normal. No respiratory distress.     Breath sounds: Normal breath sounds. No wheezing or rales.  Abdominal:     General: Abdomen is flat. Bowel sounds are normal. There is no distension.     Palpations: Abdomen is soft.     Tenderness: There is no abdominal tenderness. There is no guarding.  Musculoskeletal:        General: No swelling or tenderness.     Right lower leg: No edema.     Left lower leg: No edema.  Skin:    General: Skin is warm and dry.  Neurological:     Mental Status: He is disoriented.      ED Treatments / Results  Labs (all labs ordered are listed, but only abnormal results are displayed) Labs Reviewed  CBC WITH DIFFERENTIAL/PLATELET - Abnormal; Notable for the following components:      Result Value   WBC 2.2 (*)    RBC 3.46 (*)    Hemoglobin 10.9 (*)    HCT 30.6 (*)    All other components within normal limits  SARS CORONAVIRUS 2 (HOSPITAL ORDER, PERFORMED [place] LAB)  CULTURE, BLOOD (ROUTINE X 2)  CULTURE, BLOOD (ROUTINE X 2)  LACTIC ACID, PLASMA  BASIC METABOLIC PANEL  TROPONIN I  URINALYSIS, ROUTINE W REFLEX MICROSCOPIC  LACTIC ACID, PLASMA    EKG EKG  Interpretation  Date/Time:  Monday Nov 02 2018 13:08:10 EDT Ventricular Rate:  66 PR Interval:    QRS Duration: 105 QT Interval:  373 QTC Calculation: 391 R Axis:   42 Text Interpretation:  Sinus rhythm Prolonged PR interval Abnormal T, consider ischemia, diffuse leads st depression in the inferior and lateral leads seen on prior though slightly more pronounced today Otherwise no significant change Confirmed by Deno Etienne [phone]) on 11/02/2018 1:13:42 PM   Radiology Dg Chest Portable 1 View  Result Date: 11/02/2018 CLINICAL DATA:  Altered mental status. EXAM: PORTABLE CHEST 1 VIEW COMPARISON:  None. FINDINGS: The heart size and mediastinal contours are within normal limits. Both lungs are clear. Left-sided pacemaker is noted. No pneumothorax or pleural effusion is noted. The visualized skeletal structures are unremarkable. IMPRESSION: No active disease. Electronically Signed   By: Marijo Conception M.D.   On: 11/02/2018 13:50    Procedures Procedures (including critical care time)  Medications Ordered in ED Medications  acetaminophen (TYLENOL) suppository 650 mg (has no administration in time range)     Initial Impression / Assessment and Plan / ED Course  I have reviewed the triage vital signs and the nursing notes.  Pertinent labs & imaging results that were available during my care of the patient were reviewed by me and considered in my medical decision making (see chart for details).  Pt is an 83 yo male presenting to the ED with altered mental status from [place]. Per staff, he has been confused for the past 3 days and had a fever. Last seen normal at 9 am, he was found to be not talking and he normally does. Normally alert and oriented x4 and ambulatory. Today he was only oriented to name. He does have a history of  seizures, last on 4/20 and started on zonegran, per wife this was stopped and he is on dilantin, keppra and ativan. Wife also reports his confusion has significantly  worsened over the last two weeks and he has had numerous falls.   Febrile at 101.9, otherwise hemodynamically stable. Will order labs, chest xray, COVID-19 test and reassess.   CBC shows leukopenia 2.2. Chest xray does not show any acute cardiopulmonary disease. Lactic acid wnl. Labs and covid test pending. Will likely need admission, will sign out to Dr. Sherry Ruffing.   Final Clinical Impressions(s) / ED Diagnoses   Final diagnoses:  Altered mental status, unspecified altered mental status type    ED Discharge Orders    None       ,  N, DO 11/02/18 [place], DO 11/02/18 1529

## 2018-11-02 NOTE — ED Notes (Signed)
Nurse navigator spoke with patient's wife Opal Sidles regarding plan of care. Wife stated concerned husband fell late last week unsure if he hit his head and fell 2 times after. She also stated he stop taking Zonegran and to make sure he is not given this medication. Doctor notified.

## 2018-11-02 NOTE — Telephone Encounter (Signed)
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  Started 10-14 days ago. I reduced the medication back to the 50 mg dose and asked to have you contacted during regular office hours. CD

## 2018-11-02 NOTE — Telephone Encounter (Signed)
[facility] notified via fax to d/c Zonegran. Fax sent to # [phone]. Confirmation received.

## 2018-11-02 NOTE — H&P (Signed)
History and Physical    Cameron Potts Cameron Potts DOB: Oct 16, 1935 DOA: 11/02/2018  PCP: Gayland Curry, DO  Patient coming from: [facility]  I have personally briefly reviewed patient's old medical records in [place]  Chief Complaint: Altered mental status  HPI: Cameron Potts is a 83 y.o. male with medical history significant for atrial fibrillation on Coumadin, seizure disorder s/p VNS, hx of cardiac arrest s/p ICD who presents from his retirement facility with reported altered mental status.  Per ED documentation, [facility] staff noted that patient was confused for the last 3 days and had a fever and was only oriented to name morning of admission.  During my examination patient is alert and oriented and providing full history.  He does not recall the events prior to arriving in the ED.  He says he has recently been started on Zonegran which was recently increased due to having seizure activity on 10/05/2018.  Since then he feels like he has had not been feeling himself.  He reports recent loose stools and dehydration.  He does not think he has had a seizure.  He denies any subjective fevers, chills, diaphoresis, chest pain, palpitations, cough, dyspnea, abdominal pain, or dysuria.  ED Course:  BP 134/54, pulse 67, RR 22, temp 101.9 Fahrenheit rectally, SPO2 96% on room air.  Labs are notable for sodium 126, potassium 4.0, BUN 11, creatinine 0.79, serum glucose 108, WBC 2.2, hemoglobin 10.9, platelets are 167,000, negative troponin I, lactic acid 0.8, urinalysis was negative for UTI.  SARS-CoV-2 test was negative.  Blood cultures were drawn and pending.  Portable chest x-ray showed DNS and PPM in place without focal consolidation, infiltrate, or effusion.  CT head without contrast showed stable moderate generalized atrophy with mild chronic microvascular ischemic changes of the white matter without acute intracranial abnormality.  Stable encephalomalacia involving the  right temporal lobe was seen.  Patient was given Tylenol and 500 mils normal saline and the hospitalist service was consulted to admit for further evaluation and management.   Review of Systems: All systems reviewed and are negative except as documented in history of present illness above.   Past Medical History:  Diagnosis Date   Abnormality of gait 12/21/2013   Atrial fibrillation (HCC)    Blind left eye    Cardiac arrest ([place])    Glaucoma    Hypercholesteremia    Hyperlipidemia    Mild left ventricular systolic dysfunction    PSA elevation    Right frontal lobe lesion    Seizures (HCC)    Urinary urgency     Past Surgical History:  Procedure Laterality Date   CARDIAC DEFIBRILLATOR PLACEMENT     cataract surgery     COLONOSCOPY     CRANIOTOMY Right    right anterior temporal resection   defibrillator replaced  March 2016   IMPLANTATION VAGAL NERVE STIMULATOR  June 2016   NASAL SINUS SURGERY     TONSILLECTOMY     age 30   VASECTOMY  42    Social History:  reports that he has quit smoking. His smoking use included cigarettes. He has a 7.50 pack-year smoking history. He has never used smokeless tobacco. He reports that he does not drink alcohol or use drugs.  Allergies  Allergen Reactions   Depakote [Divalproex Sodium] Other (See Comments)    Arthralgias     Family History  Problem Relation Age of Onset   Cancer - Lung Mother    Cancer - Prostate  Father      Prior to Admission medications   Medication Sig Start Date End Date Taking? Authorizing Provider  acetaminophen (TYLENOL) 650 MG CR tablet Take 650 mg by mouth every 4 (four) hours as needed for pain (up to 72 hours).   Yes [provider]  ALPRAZolam (XANAX) 0.25 MG tablet Take 1 tablet (0.25 mg total) by mouth 2 (two) times daily. 10/01/18  Yes Wert, Margreta Journey, NP  brimonidine-timolol (COMBIGAN) 0.2-0.5 % ophthalmic solution Place 1 drop into both eyes every 12 (twelve)  hours.  01/05/18  Yes [provider]  cholecalciferol (VITAMIN D) 400 units TABS tablet Take 400 Units by mouth 2 (two) times daily.   Yes [provider]  finasteride (PROSCAR) 5 MG tablet Take 5 mg by mouth daily.    Yes [provider]  flecainide (TAMBOCOR) 50 MG tablet Take 50 mg by mouth 2 (two) times daily.   Yes [provider]  fluocinonide ointment (LIDEX) 0.07 % Apply 1 application topically 2 (two) times daily as needed (on rash /skin folds, not for face).    Yes [provider]  lacosamide (VIMPAT) 200 MG TABS tablet Take 1 tablet (200 mg total) by mouth 2 (two) times daily. 06/01/18 06/02/19 Yes Wert, Margreta Journey, NP  latanoprost (XALATAN) 0.005 % ophthalmic solution Place 1 drop into both eyes at bedtime.   Yes [provider]  levETIRAcetam (KEPPRA) 750 MG tablet Take 2 tablets (1,500 mg total) by mouth 2 (two) times daily. 06/08/18  Yes Reed, Tiffany L, DO  LORazepam (ATIVAN) 2 MG/ML injection Inject 2 mg into the muscle as needed for seizure. NOT TO BE USED FOR BEHAVIORS   Yes [provider]  phenytoin (DILANTIN) 100 MG ER capsule Take 2 capsules (200 mg total) by mouth 2 (two) times daily. 03/09/18  Yes Kathrynn Ducking, MD  phenytoin (DILANTIN) 30 MG ER capsule Take 1 capsule (30 mg total) by mouth every other day. 09/24/18  Yes Kathrynn Ducking, MD  polyethylene glycol [facility] / Floria Raveling) packet Take 17 g by mouth every other day.    Yes [provider]  sodium chloride 1 g tablet Take 1 g by mouth daily.   Yes [provider]  triamcinolone cream (KENALOG) 0.1 % Apply 1 application topically 2 (two) times daily as needed (for flares).    Yes [provider]  warfarin (COUMADIN) 2.5 MG tablet Take 1 tablet (2.5 mg total) by mouth daily at 12 noon. Patient taking differently: Take 2.5 mg by mouth one time only at 6 PM. Per INR dated 10/28/18 at 12:00 pm 10/14/18  Yes Reed, Tiffany L, DO    bimatoprost (LUMIGAN) 0.01 % SOLN Place 1 drop into both eyes at bedtime.  11/03/17   [provider]  zonisamide (ZONEGRAN) 50 MG capsule 1 capsule at night for a week and then take 2 at night Patient not taking: Reported on 11/02/2018 10/06/18   Kathrynn Ducking, MD    Physical Exam: Vitals:   11/02/18 1800 11/02/18 1815 11/02/18 1915 11/02/18 2021  BP: (!) 135/55 (!) 138/59 (!) 140/52 (!) 129/59  Pulse: 62 62 65 61  Resp: 17 17 18    Temp:    97.6 F (36.4 C)  TempSrc:    Oral  SpO2: 100% 100% 98% 100%  Weight:      Height:        Constitutional: Elderly man resting supine in bed, NAD, calm, comfortable Eyes: PERRL, lids and conjunctivae normal ENMT:  Mucous membranes are dry. Posterior pharynx clear of any exudate or lesions.Normal dentition.  Neck: normal, supple, no masses. Respiratory: clear to auscultation bilaterally, no wheezing, no crackles. Normal respiratory effort. No accessory muscle use.  Cardiovascular: Regular rate and rhythm, no murmurs / rubs / gallops. No extremity edema. 2+ pedal pulses.  ICD in place left chest wall, VNS in place right chest wall. Abdomen: no tenderness, no masses palpated. No hepatosplenomegaly. Bowel sounds positive.  GU: Condom cath in place Musculoskeletal: no clubbing / cyanosis. No joint deformity upper and lower extremities. Good ROM, no contractures. Normal muscle tone.  Skin: no rashes, lesions, ulcers. No induration Neurologic: CN 2-12 grossly intact. Sensation intact, Strength 5/5 in all 4.  Psychiatric: Normal judgment and insight. Alert and oriented to person, place, and year. Normal mood.    Labs on Admission: I have personally reviewed following labs and imaging studies  CBC: Recent Labs  Lab 11/02/18 1406  WBC 2.2*  NEUTROABS 0.7*  HGB 10.9*  HCT 30.6*  MCV 88.4  PLT 443   Basic Metabolic Panel: Recent Labs  Lab 11/02/18 1406  NA 126*  K 4.0  CL 93*  CO2 21*  GLUCOSE 108*  BUN 11  CREATININE 0.79   CALCIUM 8.5*   GFR: Estimated Creatinine Clearance: 71.3 mL/min (by C-G formula based on SCr of 0.79 mg/dL). Liver Function Tests: No results for input(s): AST, ALT, ALKPHOS, BILITOT, PROT, ALBUMIN in the last 168 hours. No results for input(s): LIPASE, AMYLASE in the last 168 hours. No results for input(s): AMMONIA in the last 168 hours. Coagulation Profile: Recent Labs  Lab 10/28/18 1046 11/02/18 2113  INR 2.7 3.1*   Cardiac Enzymes: Recent Labs  Lab 11/02/18 1406  TROPONINI <0.03   BNP (last 3 results) No results for input(s): PROBNP in the last 8760 hours. HbA1C: No results for input(s): HGBA1C in the last 72 hours. CBG: No results for input(s): GLUCAP in the last 168 hours. Lipid Profile: No results for input(s): CHOL, HDL, LDLCALC, TRIG, CHOLHDL, LDLDIRECT in the last 72 hours. Thyroid Function Tests: No results for input(s): TSH, T4TOTAL, FREET4, T3FREE, THYROIDAB in the last 72 hours. Anemia Panel: No results for input(s): VITAMINB12, FOLATE, FERRITIN, TIBC, IRON, RETICCTPCT in the last 72 hours. Urine analysis:    Component Value Date/Time   COLORURINE YELLOW 11/02/2018 1647   APPEARANCEUR HAZY (A) 11/02/2018 1647   LABSPEC 1.018 11/02/2018 1647   PHURINE 5.0 11/02/2018 1647   GLUCOSEU NEGATIVE 11/02/2018 1647   HGBUR MODERATE (A) 11/02/2018 1647   BILIRUBINUR NEGATIVE 11/02/2018 1647   KETONESUR 20 (A) 11/02/2018 1647   PROTEINUR NEGATIVE 11/02/2018 1647   UROBILINOGEN 0.2 04/10/2013 0910   NITRITE NEGATIVE 11/02/2018 1647   LEUKOCYTESUR NEGATIVE 11/02/2018 1647    Radiological Exams on Admission: Ct Head Wo Contrast  Result Date: 11/02/2018 CLINICAL DATA:  83 year old nursing home patient with progressively worsening confusion over the past 3 days, associated with fever. EXAM: CT HEAD WITHOUT CONTRAST TECHNIQUE: Contiguous axial images were obtained from the base of the skull through the vertex without intravenous contrast. COMPARISON:  10/05/2018 and  earlier. FINDINGS: Brain: Patient motion blurred images of the skull base. Moderate cortical and deep atrophy, unchanged dating back to 2019. Mild changes of small vessel disease of the white matter, with a more focal white matter stroke involving the RIGHT POSTERIOR frontal lobe, unchanged. Encephalomalacia involving the RIGHT temporal lobe, unchanged. No mass lesion. No midline shift. No acute hemorrhage or hematoma. No extra-axial fluid collections. No  evidence of acute infarction. Vascular: Severe BILATERAL carotid siphon and BILATERAL vertebral artery atherosclerosis. No hyperdense vessel. Skull: Prior RIGHT temporal craniotomy. No skull fracture or focal osseous abnormality elsewhere involving the skull. Sinuses/Orbits: Mucosal thickening involving the maxillary sinuses. Apparent prior BILATERAL maxillary sinus MEDIAL antrostomies. Opacification of ANTERIOR ethmoid air cells bilaterally. Mild mucosal thickening involving the RIGHT frontal sinus. Sphenoid sinuses well aerated. BILATERAL mastoid air cells and BILATERAL middle ear cavities well-aerated. Benign senile calcifications involving both eyes. Other: None. IMPRESSION: 1. No acute intracranial abnormality. 2. Stable moderate generalized atrophy and mild chronic microvascular ischemic changes of the white matter. 3. Stable encephalomalacia involving the right temporal lobe. 4. Mild chronic bilateral maxillary, bilateral ethmoid and right frontal sinus disease. Electronically Signed   By: Evangeline Dakin M.D.   On: 11/02/2018 16:41   Dg Chest Portable 1 View  Result Date: 11/02/2018 CLINICAL DATA:  Altered mental status. EXAM: PORTABLE CHEST 1 VIEW COMPARISON:  None. FINDINGS: The heart size and mediastinal contours are within normal limits. Both lungs are clear. Left-sided pacemaker is noted. No pneumothorax or pleural effusion is noted. The visualized skeletal structures are unremarkable. IMPRESSION: No active disease. Electronically Signed   By:  Marijo Conception M.D.   On: 11/02/2018 13:50    EKG: Independently reviewed. Sinus rhythm, first-degree AV block, nonspecific ST changes inferolateral leads.  ST changes slightly more pronounced when compared to prior.  Assessment/Plan Principal Problem:   Altered mental status Active Problems:   Seizure disorder (HCC)   Chronic atrial fibrillation   Hyponatremia  Cameron Potts is a 83 y.o. male with medical history significant for atrial fibrillation on Coumadin, seizure disorder s/p VNS, hx of cardiac arrest s/p ICD who is admitted with reported AMS, hyponatremia, and fever.   Altered mental status: Improved by the time of my evaluation, he was alert and oriented to person place and time.  Question secondary to dehydration and hyponatremia versus whether patient had a seizure episode.  Per ED documentation, mental status change has been current for several days, possible side effect of recent initiation of zonisamide. -Rehydrate and correct hyponatremia -Resume home antiepileptics with exception of zonisamide  Hyponatremia: Likely hypovolemic, possibly exacerbated by zonisamide. -Status post 500 mL NS in ED, continue maintenance IV fluids overnight and recheck sodium in a.m.  Fever: Patient with fever on arrival to the ED.  No obvious infectious source.  He has no nuchal rigidity or meningismus.  Procalcitonin is 0.24, no strong suspicion for bacterial infection.  Blood cultures were obtained and pending.  Will hold Tylenol and monitor fever curve.  Will obtain 2 view chest x-ray in a.m. to reevaluate her any underlying pneumonia.  Leukopenia: WBC 2.2, hemoglobin and platelets also slightly decreased when compared to prior.  Unclear etiology, will continue to monitor.  Seizure disorder s/p VNS: Follows closely with neurology, Dr. Jannifer Franklin.  On multiple antiepileptics and with VNS in place.  Recently started on zonisamide after seizure on 10/05/2018, which has been recommended to  discontinue per neurology telephone note. -DC zonisamide -Continue home alprazolam, Vimpat, Keppra, Dilantin -Seizure precautions  Atrial fibrillation: Sinus rhythm on admission.  He is on Coumadin for anticoagulation and flecainide for rhythm control. -Continue Coumadin per pharmacy -Continue flecainide   DVT prophylaxis: Coumadin Code Status: DNR, confirmed with patient Family Communication: None present on admission Disposition Plan: Likely discharge to previous environment pending clinical progress Consults called: None Admission status: Observation   Zada Finders MD [facility]  If 7PM-7AM, please contact night-coverage  [email or web address]  11/02/2018, 10:23 PM

## 2018-11-02 NOTE — ED Notes (Signed)
ED TO INPATIENT HANDOFF REPORT  ED Nurse Name and Phone #: Dimitria Ketchum [phone]  S Name/Age/Gender Cameron Potts 83 y.o. male Room/Bed: 030C/030C  Code Status   Code Status: DNR  Home/SNF/Other Nursing Home Patient oriented to: self, place, time and situation Is this baseline? Yes   Triage Complete: Triage complete  Chief Complaint Altered  Triage Note Pt from [facility] via ems; per staff, pt has been confused x 3 days, more confused today; LSN at 0900; not talking as he normally does; normally A&Ox 4 and ambulatory, today only oriented to name; pt had fever at [facility], ems temp 62F; hx seizures; last seizure 4/20 - started zonegran after last seizure, 50 mg daily x 2 weeks, increased 5/5 to 100 mg daily;   137/54 Hr 79, paced 89-91% RA, paled on 2L, 99% CBG 117 Temp 98.81F temporal   Allergies Allergies  Allergen Reactions  . Depakote [Divalproex Sodium] Other (See Comments)    Arthralgias     Level of Care/Admitting Diagnosis ED Disposition    ED Disposition Condition Comment   Admit  Hospital Area: [place] [[id number]]  Level of Care: Telemetry Medical [104]  I expect the patient will be discharged within 24 hours: No (not a candidate for 5C-Observation unit)  Covid Evaluation: N/A  Diagnosis: Altered mental status [780.97.ICD-9-CM]  Admitting Physician: Lenore Cordia [[id number]]  Attending Physician: Lenore Cordia [[id number]]  PT Class (Do Not Modify): Observation [104]  PT Acc Code (Do Not Modify): Observation [10022]       B Medical/Surgery History Past Medical History:  Diagnosis Date  . Abnormality of gait 12/21/2013  . Atrial fibrillation ([place])   . Blind left eye   . Cardiac arrest ([place])   . Glaucoma   . Hypercholesteremia   . Hyperlipidemia   . Mild left ventricular systolic dysfunction   . PSA elevation   . Right frontal lobe lesion   . Seizures ([place])   . Urinary urgency    Past Surgical History:  Procedure Laterality Date   . CARDIAC DEFIBRILLATOR PLACEMENT    . cataract surgery    . COLONOSCOPY    . CRANIOTOMY Right    right anterior temporal resection  . defibrillator replaced  March 2016  . IMPLANTATION VAGAL NERVE STIMULATOR  June 2016  . NASAL SINUS SURGERY    . TONSILLECTOMY     age 76  . VASECTOMY  1985     A IV Location/Drains/Wounds Patient Lines/Drains/Airways Status   Active Line/Drains/Airways    Name:   Placement date:   Placement time:   Site:   Days:   Peripheral IV 11/02/18 Anterior;Right Forearm   11/02/18    1412    Forearm   less than 1          Intake/Output Last 24 hours No intake or output data in the 24 hours ending 11/02/18 1928  Labs/Imaging Results for orders placed or performed during the hospital encounter of 11/02/18 (from the past 48 hour(s))  Basic metabolic panel     Status: Abnormal   Collection Time: 11/02/18  2:06 PM  Result Value Ref Range   Sodium 126 (L) 135 - 145 mmol/L   Potassium 4.0 3.5 - 5.1 mmol/L   Chloride 93 (L) 98 - 111 mmol/L   CO2 21 (L) 22 - 32 mmol/L   Glucose, Bld 108 (H) 70 - 99 mg/dL   BUN 11 8 - 23 mg/dL   Creatinine, Ser 0.79 0.61 - 1.24 mg/dL  Calcium 8.5 (L) 8.9 - 10.3 mg/dL   GFR calc non Af Amer >60 >60 mL/min   GFR calc Af Amer >60 >60 mL/min   Anion gap 12 5 - 15    Comment: Performed at [facility] [address] [place], [place] [address]  Troponin I - Once     Status: None   Collection Time: 11/02/18  2:06 PM  Result Value Ref Range   Troponin I <0.03 <0.03 ng/mL    Comment: Performed at [facility] [address] [place], [place] [address]  CBC with Differential     Status: Abnormal   Collection Time: 11/02/18  2:06 PM  Result Value Ref Range   WBC 2.2 (L) 4.0 - 10.5 K/uL   RBC 3.46 (L) 4.22 - 5.81 MIL/uL   Hemoglobin 10.9 (L) 13.0 - 17.0 g/dL   HCT 30.6 (L) 39.0 - 52.0 %   MCV 88.4 80.0 - 100.0 fL   MCH 31.5 26.0 - 34.0 pg   MCHC 35.6 30.0 - 36.0 g/dL   RDW 12.0 11.5 - 15.5 %   Platelets  167 150 - 400 K/uL   nRBC 0.0 0.0 - 0.2 %   Neutrophils Relative % 33 %   Neutro Abs 0.7 (L) 1.7 - 7.7 K/uL   Lymphocytes Relative 31 %   Lymphs Abs 0.7 0.7 - 4.0 K/uL   Monocytes Relative 29 %   Monocytes Absolute 0.6 0.1 - 1.0 K/uL   Eosinophils Relative 3 %   Eosinophils Absolute 0.1 0.0 - 0.5 K/uL   Basophils Relative 4 %   Basophils Absolute 0.1 0.0 - 0.1 K/uL   WBC Morphology See Note     Comment: Increased Bands. >20% Bands  >10% reactive, Benign Lymphocytes.    nRBC 0 0 /100 WBC   Abs Immature Granulocytes 0.00 0.00 - 0.07 K/uL    Comment: Performed at [facility], [place] [address], [place], [place] [address]  Lactic acid, plasma     Status: None   Collection Time: 11/02/18  2:06 PM  Result Value Ref Range   Lactic Acid, Venous 0.8 0.5 - 1.9 mmol/L    Comment: Performed at [facility] [address], [place], [place] [address]  SARS Coronavirus 2 (CEPHEID- Performed in [facility] hospital lab), Hosp Order     Status: None   Collection Time: 11/02/18  2:29 PM  Result Value Ref Range   SARS Coronavirus 2 NEGATIVE NEGATIVE    Comment: (NOTE) If result is NEGATIVE SARS-CoV-2 target nucleic acids are NOT DETECTED. The SARS-CoV-2 RNA is generally detectable in upper and lower  respiratory specimens during the acute phase of infection. The lowest  concentration of SARS-CoV-2 viral copies this assay can detect is 250  copies / mL. A negative result does not preclude SARS-CoV-2 infection  and should not be used as the sole basis for treatment or other  patient management decisions.  A negative result may occur with  improper specimen collection / handling, submission of specimen other  than nasopharyngeal swab, presence of viral mutation(s) within the  areas targeted by this assay, and inadequate number of viral copies  (<250 copies / mL). A negative result must be combined with clinical  observations, patient history, and epidemiological information. If result  is POSITIVE SARS-CoV-2 target nucleic acids are DETECTED. The SARS-CoV-2 RNA is generally detectable in upper and lower  respiratory specimens dur ing the acute phase of infection.  Positive  results are indicative of active infection with SARS-CoV-2.  Clinical  correlation with patient history and other diagnostic information is  necessary to determine patient infection status.  Positive results do  not rule out bacterial infection or co-infection with other viruses. If result is PRESUMPTIVE POSTIVE SARS-CoV-2 nucleic acids MAY BE PRESENT.   A presumptive positive result was obtained on the submitted specimen  and confirmed on repeat testing.  While 2019 novel coronavirus  (SARS-CoV-2) nucleic acids may be present in the submitted sample  additional confirmatory testing may be necessary for epidemiological  and / or clinical management purposes  to differentiate between  SARS-CoV-2 and other Sarbecovirus currently known to infect humans.  If clinically indicated additional testing with an alternate test  methodology [phone]) is advised. The SARS-CoV-2 RNA is generally  detectable in upper and lower respiratory sp ecimens during the acute  phase of infection. The expected result is Negative. Fact Sheet for Patients:  StrictlyIdeas.no Fact Sheet for Healthcare Providers: BankingDealers.co.za This test is not yet approved or cleared by the [place] FDA and has been authorized for detection and/or diagnosis of SARS-CoV-2 by FDA under an Emergency Use Authorization (EUA).  This EUA will remain in effect (meaning this test can be used) for the duration of the COVID-19 declaration under Section 564(b)(1) of the Act, 21 U.S.C. section 360bbb-3(b)(1), unless the authorization is terminated or revoked sooner. Performed at [facility], [place] [address], [place], [place] [address]   Urinalysis, Routine w reflex microscopic     Status:  Abnormal   Collection Time: 11/02/18  4:47 PM  Result Value Ref Range   Color, Urine YELLOW YELLOW   APPearance HAZY (A) CLEAR   Specific Gravity, Urine 1.018 1.005 - 1.030   pH 5.0 5.0 - 8.0   Glucose, UA NEGATIVE NEGATIVE mg/dL   Hgb urine dipstick MODERATE (A) NEGATIVE   Bilirubin Urine NEGATIVE NEGATIVE   Ketones, ur 20 (A) NEGATIVE mg/dL   Protein, ur NEGATIVE NEGATIVE mg/dL   Nitrite NEGATIVE NEGATIVE   Leukocytes,Ua NEGATIVE NEGATIVE   RBC / HPF 0-5 0 - 5 RBC/hpf   WBC, UA 0-5 0 - 5 WBC/hpf   Bacteria, UA RARE (A) NONE SEEN   Mucus PRESENT     Comment: Performed at [facility], [address], [place], [place] [address]   Ct Head Wo Contrast  Result Date: 11/02/2018 CLINICAL DATA:  83 year old nursing home patient with progressively worsening confusion over the past 3 days, associated with fever. EXAM: CT HEAD WITHOUT CONTRAST TECHNIQUE: Contiguous axial images were obtained from the base of the skull through the vertex without intravenous contrast. COMPARISON:  10/05/2018 and earlier. FINDINGS: Brain: Patient motion blurred images of the skull base. Moderate cortical and deep atrophy, unchanged dating back to 2019. Mild changes of small vessel disease of the white matter, with a more focal white matter stroke involving the RIGHT POSTERIOR frontal lobe, unchanged. Encephalomalacia involving the RIGHT temporal lobe, unchanged. No mass lesion. No midline shift. No acute hemorrhage or hematoma. No extra-axial fluid collections. No evidence of acute infarction. Vascular: Severe BILATERAL carotid siphon and BILATERAL vertebral artery atherosclerosis. No hyperdense vessel. Skull: Prior RIGHT temporal craniotomy. No skull fracture or focal osseous abnormality elsewhere involving the skull. Sinuses/Orbits: Mucosal thickening involving the maxillary sinuses. Apparent prior BILATERAL maxillary sinus MEDIAL antrostomies. Opacification of ANTERIOR ethmoid air cells bilaterally. Mild mucosal  thickening involving the RIGHT frontal sinus. Sphenoid sinuses well aerated. BILATERAL mastoid air cells and BILATERAL middle  ear cavities well-aerated. Benign senile calcifications involving both eyes. Other: None. IMPRESSION: 1. No acute intracranial abnormality. 2. Stable moderate generalized atrophy and mild chronic microvascular ischemic changes of the white matter. 3. Stable encephalomalacia involving the right temporal lobe. 4. Mild chronic bilateral maxillary, bilateral ethmoid and right frontal sinus disease. Electronically Signed   By: Evangeline Dakin M.D.   On: 11/02/2018 16:41   Dg Chest Portable 1 View  Result Date: 11/02/2018 CLINICAL DATA:  Altered mental status. EXAM: PORTABLE CHEST 1 VIEW COMPARISON:  None. FINDINGS: The heart size and mediastinal contours are within normal limits. Both lungs are clear. Left-sided pacemaker is noted. No pneumothorax or pleural effusion is noted. The visualized skeletal structures are unremarkable. IMPRESSION: No active disease. Electronically Signed   By: Marijo Conception M.D.   On: 11/02/2018 13:50    Pending Labs Unresulted Labs (From admission, onward)    Start     Ordered   11/03/18 8242  Basic metabolic panel  Tomorrow morning,   R    Question:  Specimen collection method  Answer:  IV Team=IV Team collect   11/02/18 1920   11/03/18 0500  CBC  Tomorrow morning,   R    Question:  Specimen collection method  Answer:  IV Team=IV Team collect   11/02/18 1920   11/03/18 0500  Protime-INR  Tomorrow morning,   R    Question:  Specimen collection method  Answer:  IV Team=IV Team collect   11/02/18 1920   11/02/18 1926  CK  Once,   R    Question:  Specimen collection method  Answer:  IV Team=IV Team collect   11/02/18 1925   11/02/18 1848  Sodium, urine, random  Add-on,   R     11/02/18 1847   11/02/18 1848  Procalcitonin - Baseline  Once,   STAT    Question:  Specimen collection method  Answer:  IV Team=IV Team collect   11/02/18 1847    11/02/18 1847  Protime-INR  Once,   R    Question:  Specimen collection method  Answer:  IV Team=IV Team collect   11/02/18 1847   11/02/18 1847  Osmolality, urine  Add-on,   R     11/02/18 1847   11/02/18 1847  Osmolality  Once,   R    Question:  Specimen collection method  Answer:  IV Team=IV Team collect   11/02/18 1847   11/02/18 1614  Urine culture  ONCE - STAT,   STAT     11/02/18 1613   11/02/18 1336  Lactic acid, plasma  Now then every 2 hours,   STAT     11/02/18 1335   11/02/18 1336  Culture, blood (routine x 2)  BLOOD CULTURE X 2,   STAT     11/02/18 1335          Vitals/Pain Today's Vitals   11/02/18 1730 11/02/18 1745 11/02/18 1800 11/02/18 1815  BP:  (!) 126/56 (!) 135/55 (!) 138/59  Pulse:  62 62 62  Resp:  17 17 17   Temp:      TempSrc:      SpO2:  100% 100% 100%  Weight:      Height:      PainSc: 0-No pain       Isolation Precautions No active isolations  Medications Medications  sodium chloride flush (NS) 0.9 % injection 3 mL (has no administration in time range)  acetaminophen (TYLENOL) tablet 650 mg (has no administration in  time range)    Or  acetaminophen (TYLENOL) suppository 650 mg (has no administration in time range)  0.9 %  sodium chloride infusion (has no administration in time range)  ALPRAZolam (XANAX) tablet 0.25 mg (has no administration in time range)  latanoprost (XALATAN) 0.005 % ophthalmic solution 1 drop (has no administration in time range)  brimonidine-timolol (COMBIGAN) 0.2-0.5 % ophthalmic solution 1 drop (has no administration in time range)  finasteride (PROSCAR) tablet 5 mg (has no administration in time range)  flecainide (TAMBOCOR) tablet 50 mg (has no administration in time range)  lacosamide (VIMPAT) tablet 200 mg (has no administration in time range)  latanoprost (XALATAN) 0.005 % ophthalmic solution 1 drop (has no administration in time range)  levETIRAcetam (KEPPRA) tablet 1,500 mg (has no administration in time  range)  phenytoin (DILANTIN) ER capsule 200 mg (has no administration in time range)  phenytoin (DILANTIN) ER capsule 30 mg (has no administration in time range)  acetaminophen (TYLENOL) suppository 650 mg (650 mg Rectal Given 11/02/18 1532)  sodium chloride 0.9 % bolus 500 mL (500 mLs Intravenous New Bag/Given 11/02/18 1750)    Mobility walks with device High fall risk   Focused Assessments Neuro Assessment Handoff:  Swallow screen pass? n/a Cardiac Rhythm: Normal sinus rhythm       Neuro Assessment: Exceptions to WDL Neuro Checks:      Last Documented NIHSS Modified Score:   Has TPA been given? No If patient is a Neuro Trauma and patient is going to OR before floor call report to [facility] nurse: [phone] or [phone]     R Recommendations: See Admitting Provider Note  Report given to:   Additional Notes:

## 2018-11-02 NOTE — ED Notes (Signed)
Pt speaking on phone with wife. 

## 2018-11-02 NOTE — ED Triage Notes (Signed)
Pt from [facility] via ems; per staff, pt has been confused x 3 days, more confused today; LSN at 0900; not talking as he normally does; normally A&Ox 4 and ambulatory, today only oriented to name; pt had fever at [facility], ems temp 16F; hx seizures; last seizure 4/20 - started zonegran after last seizure, 50 mg daily x 2 weeks, increased 5/5 to 100 mg daily;   137/54 Hr 79, paced 89-91% RA, paled on 2L, 99% CBG 117 Temp 98.4F temporal

## 2018-11-02 NOTE — Telephone Encounter (Signed)
UA results received 11/02/18. Results placed in Dr. Jannifer Franklin office to review and advise.

## 2018-11-02 NOTE — Telephone Encounter (Signed)
Autumn with [facility] called stating that they faxed over the results to his UA on May 17. The passed 48 hours he has had 2 spells of confusion. He has gone from 100 mg to 50 mg on his zonegran. The best number to contact autumn is [phone].

## 2018-11-02 NOTE — ED Notes (Signed)
Patient transported to CT 

## 2018-11-02 NOTE — ED Provider Notes (Signed)
Care assumed from Dr. Tyrone Nine.  At time of transfer care, patient is awaiting results of CT head and urinalysis to determine etiology of his multiple falls, fever, and altered mental status.  Per previous team, he was having no headache or neck stiffness, they have low suspicion for meningitis.  Initial coronavirus test is negative.  Metabolic panel shows hyponatremia of 126 which is down from prior.  Kidney function is normal.  Troponin negative.  Patient has a leukopenia and anemia which is new.  Initial lactic acid is normal, they did not feel he was septic.  He has not been hypotensive.  Chest x-ray did not show pneumonia.  After urinalysis and CT head are completed, patient will be admitted for altered mental status likely secondary to infection or hyponatremia.  5:37 PM Urinalysis returned showing no evidence of infection with no nitrites or leukocytes.  There is rare bacteria.  CT head shows no acute intracranial abnormality.  Stable microvascular ischemic changes and encephalomalacia seen.  Patient still remains on 2 L for the hypoxia which he is not normally on.  We will call to admit for hyponatremia and altered mental status with mild hypoxia.     Gertrue Willette, Gwenyth Allegra, MD 11/03/18 0020

## 2018-11-02 NOTE — Telephone Encounter (Signed)
I called the wife back.  The patient has been sent to the hospital, he started running fevers today of 101.5, he has diarrhea, his mental status is very poor, he is lethargic.  According the wife, he had a seizure after the ambulance got there.  He will be evaluated in the ER.

## 2018-11-03 ENCOUNTER — Observation Stay (HOSPITAL_COMMUNITY): Payer: Medicare Other

## 2018-11-03 ENCOUNTER — Other Ambulatory Visit: Payer: Self-pay

## 2018-11-03 ENCOUNTER — Inpatient Hospital Stay (HOSPITAL_COMMUNITY): Payer: Medicare Other

## 2018-11-03 DIAGNOSIS — G9341 Metabolic encephalopathy: Secondary | ICD-10-CM | POA: Diagnosis present

## 2018-11-03 DIAGNOSIS — R4182 Altered mental status, unspecified: Secondary | ICD-10-CM | POA: Diagnosis not present

## 2018-11-03 DIAGNOSIS — Z801 Family history of malignant neoplasm of trachea, bronchus and lung: Secondary | ICD-10-CM | POA: Diagnosis not present

## 2018-11-03 DIAGNOSIS — M8580 Other specified disorders of bone density and structure, unspecified site: Secondary | ICD-10-CM | POA: Diagnosis present

## 2018-11-03 DIAGNOSIS — Z66 Do not resuscitate: Secondary | ICD-10-CM | POA: Diagnosis present

## 2018-11-03 DIAGNOSIS — E860 Dehydration: Secondary | ICD-10-CM | POA: Diagnosis present

## 2018-11-03 DIAGNOSIS — J181 Lobar pneumonia, unspecified organism: Secondary | ICD-10-CM | POA: Diagnosis not present

## 2018-11-03 DIAGNOSIS — I482 Chronic atrial fibrillation, unspecified: Secondary | ICD-10-CM | POA: Diagnosis present

## 2018-11-03 DIAGNOSIS — E871 Hypo-osmolality and hyponatremia: Secondary | ICD-10-CM

## 2018-11-03 DIAGNOSIS — H409 Unspecified glaucoma: Secondary | ICD-10-CM | POA: Diagnosis present

## 2018-11-03 DIAGNOSIS — E785 Hyperlipidemia, unspecified: Secondary | ICD-10-CM | POA: Diagnosis present

## 2018-11-03 DIAGNOSIS — Z888 Allergy status to other drugs, medicaments and biological substances status: Secondary | ICD-10-CM | POA: Diagnosis not present

## 2018-11-03 DIAGNOSIS — Z87891 Personal history of nicotine dependence: Secondary | ICD-10-CM | POA: Diagnosis not present

## 2018-11-03 DIAGNOSIS — Z7901 Long term (current) use of anticoagulants: Secondary | ICD-10-CM | POA: Diagnosis not present

## 2018-11-03 DIAGNOSIS — Z8042 Family history of malignant neoplasm of prostate: Secondary | ICD-10-CM | POA: Diagnosis not present

## 2018-11-03 DIAGNOSIS — J189 Pneumonia, unspecified organism: Secondary | ICD-10-CM | POA: Diagnosis present

## 2018-11-03 DIAGNOSIS — R400 Somnolence: Secondary | ICD-10-CM | POA: Diagnosis not present

## 2018-11-03 DIAGNOSIS — E7800 Pure hypercholesterolemia, unspecified: Secondary | ICD-10-CM | POA: Diagnosis present

## 2018-11-03 DIAGNOSIS — R509 Fever, unspecified: Secondary | ICD-10-CM | POA: Diagnosis not present

## 2018-11-03 DIAGNOSIS — Z95810 Presence of automatic (implantable) cardiac defibrillator: Secondary | ICD-10-CM | POA: Diagnosis not present

## 2018-11-03 DIAGNOSIS — E861 Hypovolemia: Secondary | ICD-10-CM | POA: Diagnosis present

## 2018-11-03 DIAGNOSIS — R918 Other nonspecific abnormal finding of lung field: Secondary | ICD-10-CM | POA: Diagnosis not present

## 2018-11-03 DIAGNOSIS — Z20828 Contact with and (suspected) exposure to other viral communicable diseases: Secondary | ICD-10-CM | POA: Diagnosis present

## 2018-11-03 DIAGNOSIS — D709 Neutropenia, unspecified: Secondary | ICD-10-CM | POA: Diagnosis not present

## 2018-11-03 DIAGNOSIS — Z8674 Personal history of sudden cardiac arrest: Secondary | ICD-10-CM | POA: Diagnosis not present

## 2018-11-03 DIAGNOSIS — G40909 Epilepsy, unspecified, not intractable, without status epilepticus: Secondary | ICD-10-CM | POA: Diagnosis present

## 2018-11-03 LAB — MRSA PCR SCREENING: MRSA by PCR: NEGATIVE

## 2018-11-03 LAB — CBC WITH DIFFERENTIAL/PLATELET
Abs Immature Granulocytes: 0.03 10*3/uL (ref 0.00–0.07)
Basophils Absolute: 0.1 10*3/uL (ref 0.0–0.1)
Basophils Relative: 1 %
Eosinophils Absolute: 0.2 10*3/uL (ref 0.0–0.5)
Eosinophils Relative: 5 %
HCT: 31.1 % — ABNORMAL LOW (ref 39.0–52.0)
Hemoglobin: 11.6 g/dL — ABNORMAL LOW (ref 13.0–17.0)
Immature Granulocytes: 1 %
Lymphocytes Relative: 37 %
Lymphs Abs: 1.3 10*3/uL (ref 0.7–4.0)
MCH: 32.6 pg (ref 26.0–34.0)
MCHC: 37.3 g/dL — ABNORMAL HIGH (ref 30.0–36.0)
MCV: 87.4 fL (ref 80.0–100.0)
Monocytes Absolute: 1.1 10*3/uL — ABNORMAL HIGH (ref 0.1–1.0)
Monocytes Relative: 32 %
Neutro Abs: 0.9 10*3/uL — ABNORMAL LOW (ref 1.7–7.7)
Neutrophils Relative %: 24 %
Platelets: 192 10*3/uL (ref 150–400)
RBC: 3.56 MIL/uL — ABNORMAL LOW (ref 4.22–5.81)
RDW: 12 % (ref 11.5–15.5)
WBC Morphology: INCREASED
WBC: 3.6 10*3/uL — ABNORMAL LOW (ref 4.0–10.5)
nRBC: 0 % (ref 0.0–0.2)

## 2018-11-03 LAB — BASIC METABOLIC PANEL
Anion gap: 9 (ref 5–15)
BUN: 9 mg/dL (ref 8–23)
CO2: 20 mmol/L — ABNORMAL LOW (ref 22–32)
Calcium: 8.2 mg/dL — ABNORMAL LOW (ref 8.9–10.3)
Chloride: 96 mmol/L — ABNORMAL LOW (ref 98–111)
Creatinine, Ser: 0.7 mg/dL (ref 0.61–1.24)
GFR calc Af Amer: >60 mL/min (ref >60)
GFR calc non Af Amer: >60 mL/min (ref >60)
Glucose, Bld: 92 mg/dL (ref 70–99)
Potassium: 3.8 mmol/L (ref 3.5–5.1)
Sodium: 125 mmol/L — ABNORMAL LOW (ref 135–145)

## 2018-11-03 LAB — PATHOLOGIST SMEAR REVIEW

## 2018-11-03 LAB — URINE CULTURE: Culture: <10000 — AB

## 2018-11-03 LAB — PROTIME-INR
INR: 3.3 — ABNORMAL HIGH (ref 0.8–1.2)
Prothrombin Time: 33.4 seconds — ABNORMAL HIGH (ref 11.4–15.2)

## 2018-11-03 MED ORDER — SODIUM CHLORIDE 0.9 % IV SOLN
500.0000 mg | INTRAVENOUS | Status: DC
  Administered 2018-11-03: 500 mg via INTRAVENOUS
  Filled 2018-11-03 (×2): qty 500

## 2018-11-03 MED ORDER — SODIUM CHLORIDE 0.9 % IV SOLN
INTRAVENOUS | Status: DC
  Administered 2018-11-03: 12:00:00 via INTRAVENOUS

## 2018-11-03 MED ORDER — SODIUM CHLORIDE 0.9 % IV SOLN
1.0000 g | INTRAVENOUS | Status: DC
  Administered 2018-11-03: 1 g via INTRAVENOUS
  Filled 2018-11-03: qty 10

## 2018-11-03 MED ORDER — SODIUM CHLORIDE 1 G PO TABS
1.0000 g | ORAL_TABLET | Freq: Every day | ORAL | Status: DC
  Administered 2018-11-03 – 2018-11-04 (×2): 1 g via ORAL
  Filled 2018-11-03 (×2): qty 1

## 2018-11-03 MED ORDER — POLYETHYLENE GLYCOL 3350 17 GM/SCOOP PO POWD
1.0000 | Freq: Once | ORAL | Status: AC
  Administered 2018-11-03: 255 g via ORAL
  Filled 2018-11-03: qty 255

## 2018-11-03 NOTE — Progress Notes (Signed)
EEG completed, results pending. 

## 2018-11-03 NOTE — Procedures (Addendum)
ELECTROENCEPHALOGRAM REPORT   Patient: Cameron Potts       Room #: 3X90W EEG No. ID: [id number] Age: 83 y.o.        Sex: male Referring Physician: Eliseo Squires Report Date:  11/03/2018        Interpreting Physician: Alexis Goodell  History: Cameron Potts is an 83 y.o. male with altered mental status  Medications:  Xanax, Zithromax, Rocephin, Proscar, Tambocor, Vimpat, Keppra, Dilantin, Coumadin   Conditions of Recording:  This is a 21 channel routine scalp EEG performed with bipolar and monopolar montages arranged in accordance to the international 10/20 system of electrode placement. One channel was dedicated to EKG recording.  The patient is in the awake and drowsy states.  Description:  The waking background activity is asymmetric with no sustained dominant posterior background rhythm noted.  The right hemisphere is dominated by alpha and delta rhythms.  The left hemispheres is dominated by delta and theta rhythms.  Although the background rhythm on both hemispheres is continuous the background rhythm on the left hemisphere is of lower voltage.   Activity over both hemispheres slows with drowse but the asymmetry between the hemispheres continues.   No epileptiform activity is noted.  Hyperventilation and intermittent photic stimulation were not performed.  IMPRESSION: This is an abnormal electroencephalogram secondary to left hemispheric slowing and decreased amplitude.  No epileptiform activity is noted.     Alexis Goodell, MD Neurology [phone] 11/03/2018, 7:11 PM

## 2018-11-03 NOTE — Progress Notes (Signed)
Occupational Therapy Evaluation Patient Details Name: Cameron Potts MRN: [id number] DOB: [birth year] Today's Date: 11/03/2018    History of Present Illness Cameron Potts is a 83 y.o. male with medical history significant for atrial fibrillation on Coumadin, seizure disorder s/p VNS, hx of cardiac arrest s/p ICD who presents from his retirement facility with reported altered mental status and fever. Covid -    Clinical Impression   PTA, pt was living at [facility], and reports he was independent with ADL/IADL and functional mobility at RW level. Pt currently requires minA for ADL/IADL and functional mobiltiy. Pt demonstrates cognitive limitations listed below (see OT problem list), impacting his ability to safely and independently complete ADL/IADL. Due to decline in current level of function, pt would benefit from acute OT to address established goals to facilitate safe D/C to venue listed below. At this time, recommend HHOT with physical assistance/supervision with functional mobility. Will continue to follow acutely.     Follow Up Recommendations  Home health OT;Supervision - Intermittent(Supervision for functional mobility )    Equipment Recommendations       Recommendations for Other Services       Precautions / Restrictions Precautions Precautions: Fall Restrictions Weight Bearing Restrictions: No      Mobility Bed Mobility Overal bed mobility: Needs Assistance Bed Mobility: Supine to Sit     Supine to sit: Supervision     General bed mobility comments: increased time and use of rail but no physical assist needed, did need to be cued several times before he understood what he was to do  Transfers Overall transfer level: Needs assistance Equipment used: Rolling walker (2 wheeled) Transfers: Sit to/from Stand Sit to Stand: Min assist         General transfer comment: min A to steady, vc's for hand placement    Balance Overall balance assessment: Needs  assistance Sitting-balance support: No upper extremity supported Sitting balance-Leahy Scale: Good     Standing balance support: No upper extremity supported Standing balance-Leahy Scale: Fair Standing balance comment: able to maintain static standing                            ADL either performed or assessed with clinical judgement   ADL Overall ADL's : Needs assistance/impaired Eating/Feeding: Supervision/ safety   Grooming: Min guard;Standing Grooming Details (indicate cue type and reason): pt completed oral care while standing at the sink with minguard for stability  Upper Body Bathing: Minimal assistance;Standing   Lower Body Bathing: Minimal assistance;Sit to/from stand   Upper Body Dressing : Set up;Sitting Upper Body Dressing Details (indicate cue type and reason): pt donned gown after setup Lower Body Dressing: Minimal assistance;Sit to/from stand Lower Body Dressing Details (indicate cue type and reason): pt able to figure-4 to don/doff socks Toilet Transfer: Minimal assistance;Ambulation Toilet Transfer Details (indicate cue type and reason): simulated sit<>stand from/return to recliner with inroom ambulation Toileting- Clothing Manipulation and Hygiene: Minimal assistance       Functional mobility during ADLs: Minimal assistance;Rolling walker General ADL Comments: pt requires increased time for task completion; verbalized good safety awareness and energy conservation strategies implemented during ADL     Vision Baseline Vision/History: Glaucoma;Wears glasses Patient Visual Report: Other (comment);No change from baseline(blind in left eye) Vision Assessment?: Vision impaired- to be further tested in functional context     Perception     Praxis      Pertinent Vitals/Pain Pain Assessment: No/denies pain  Hand Dominance Right   Extremity/Trunk Assessment Upper Extremity Assessment Upper Extremity Assessment: Generalized weakness   Lower  Extremity Assessment Lower Extremity Assessment: Defer to PT evaluation LLE Deficits / Details: LLE mildly weaker than R. Notable with ambulation as pt veers to L. LLE grossly 4/5 LLE Sensation: decreased proprioception LLE Coordination: decreased gross motor   Cervical / Trunk Assessment Cervical / Trunk Assessment: Normal   Communication Communication Communication: No difficulties   Cognition Arousal/Alertness: Awake/alert Behavior During Therapy: WFL for tasks assessed/performed Overall Cognitive Status: Impaired/Different from baseline Area of Impairment: Memory;Problem solving                     Memory: Decreased short-term memory       Problem Solving: Slow processing;Difficulty sequencing;Requires verbal cues General Comments: difficulty with STM, unable to recall if he did general LE exercises with PT, unable to recall after 3 reminders;able to remember and follow through 6 step task;pt verbalized items necessary for ADL task, able to search and find appropriate items;required vc for safe handplacement and appropriate use of RW;pt states he has difficulty with processing and reports his mental status is altered and will likely be until several months after seizures. His awareness of this is helpful   General Comments  VSS    Exercises Exercises: Other exercises General Exercises - Lower Extremity Ankle Circles/Pumps: AROM;Both;10 reps;Seated Long Arc Quad: AROM;Both;10 reps;Seated Other Exercises Other Exercises: pt able to return demonstrate general UE exercises Other Exercises: pt return demonstrated gereral LE exercises   Shoulder Instructions      Home Living Family/patient expects to be discharged to:: Assisted living                             Home Equipment: Walker - 2 wheels   Additional Comments: per pt report, he lives in ALF section of [facility]      Prior Functioning/Environment Level of Independence: Independent with  assistive device(s)        Comments: ambulates with RW, reports that he bathes and dresses himself.         OT Problem List: Decreased range of motion;Decreased strength;Decreased activity tolerance;Impaired balance (sitting and/or standing);Impaired vision/perception;Decreased cognition;Decreased safety awareness      OT Treatment/Interventions: Therapeutic exercise;Self-care/ADL training;Energy conservation;DME and/or AE instruction;Therapeutic activities;Cognitive remediation/compensation;Patient/family education;Balance training    OT Goals(Current goals can be found in the care plan section) Acute Rehab OT Goals Patient Stated Goal: "get my mind right" OT Goal Formulation: With patient Time For Goal Achievement: 11/17/18 Potential to Achieve Goals: Good ADL Goals Pt Will Perform Grooming: with modified independence Pt Will Perform Lower Body Dressing: with modified independence Pt Will Transfer to Toilet: with modified independence;ambulating Pt/caregiver will Perform Home Exercise Program: Both right and left upper extremity;With written HEP provided;Independently  OT Frequency: Min 2X/week   Barriers to D/C: Decreased caregiver support  pt living in ALF       Co-evaluation              AM-PAC OT "6 Clicks" Daily Activity     Outcome Measure Help from another person eating meals?: None Help from another person taking care of personal grooming?: None Help from another person toileting, which includes using toliet, bedpan, or urinal?: A Little Help from another person bathing (including washing, rinsing, drying)?: A Little Help from another person to put on and taking off regular upper body clothing?: A Little Help from another person to  put on and taking off regular lower body clothing?: A Little 6 Click Score: 20   End of Session Equipment Utilized During Treatment: Gait belt;Rolling walker Nurse Communication: Mobility status  Activity Tolerance: Patient  tolerated treatment well Patient left: in chair;with call bell/phone within reach;with chair alarm set  OT Visit Diagnosis: Unsteadiness on feet (R26.81);Other abnormalities of gait and mobility (R26.89);Muscle weakness (generalized) (M62.81);Low vision, both eyes (H54.2);Other symptoms and signs involving cognitive function                Time: 4503-8882 OT Time Calculation (min): 66 min Charges:  OT General Charges $OT Visit: 1 Visit OT Evaluation $OT Eval Moderate Complexity: 1 Mod OT Treatments $Self Care/Home Management : 23-37 mins $Cognitive Funtion inital: Initial 15 mins $Cognitive Funtion additional: Additional15 mins  Dorinda Hill OTR/L Acute Rehabilitation Services Office: [place] 11/03/2018, 1:23 PM

## 2018-11-03 NOTE — Evaluation (Signed)
Physical Therapy Evaluation Patient Details Name: Cameron Potts MRN: [id number] DOB: 25-Dec-1935 Today's Date: 11/03/2018   History of Present Illness  Jettson Crable is a 83 y.o. male with medical history significant for atrial fibrillation on Coumadin, seizure disorder s/p VNS, hx of cardiac arrest s/p ICD who presents from his retirement facility with reported altered mental status and fever. Covid -   Clinical Impression  Pt admitted with above diagnosis. Pt currently with functional limitations due to the deficits listed below (see PT Problem List). Pt repeatedly says that his mind is altered from recent seizures and he is trying to get it back. Needs frequent vc's for instructions and safety. Min A needed to steady with transfers and ambulation. Ambulated 100' with RW. Pt will benefit from skilled PT to increase their independence and safety with mobility to allow discharge to the venue listed below.       Follow Up Recommendations Home health PT;Supervision for mobility/OOB    Equipment Recommendations  None recommended by PT    Recommendations for Other Services       Precautions / Restrictions Precautions Precautions: Fall Restrictions Weight Bearing Restrictions: No      Mobility  Bed Mobility Overal bed mobility: Needs Assistance Bed Mobility: Supine to Sit     Supine to sit: Supervision     General bed mobility comments: increased time and use of rail but no physical assist needed, did need to be cued several times before he understood what he was to do  Transfers Overall transfer level: Needs assistance Equipment used: Rolling walker (2 wheeled) Transfers: Sit to/from Stand Sit to Stand: Min assist         General transfer comment: min A to steady, vc's for hand placement  Ambulation/Gait Ambulation/Gait assistance: Min assist Gait Distance (Feet): 100 Feet Assistive device: Rolling walker (2 wheeled) Gait Pattern/deviations: Step-through pattern;Narrow base  of support Gait velocity: decreased Gait velocity interpretation: <1.8 ft/sec, indicate of risk for recurrent falls General Gait Details: narrow base with unsteadiness noted, consistent L drift. Pt aware of this but had difficulty correcting  Stairs            Wheelchair Mobility    Modified Rankin (Stroke Patients Only)       Balance Overall balance assessment: Needs assistance Sitting-balance support: No upper extremity supported Sitting balance-Leahy Scale: Good     Standing balance support: No upper extremity supported Standing balance-Leahy Scale: Fair Standing balance comment: able to maintain static standing                              Pertinent Vitals/Pain Pain Assessment: No/denies pain    Home Living Family/patient expects to be discharged to:: Assisted living               Home Equipment: Walker - 2 wheels Additional Comments: per pt report, he lives in ALF section of [facility]    Prior Function Level of Independence: Independent with assistive device(s)         Comments: ambulates with RW, reports that he bathes and dresses himself.      Hand Dominance        Extremity/Trunk Assessment   Upper Extremity Assessment Upper Extremity Assessment: Defer to OT evaluation    Lower Extremity Assessment Lower Extremity Assessment: LLE deficits/detail LLE Deficits / Details: LLE mildly weaker than R. Notable with ambulation as pt veers to L. LLE grossly 4/5 LLE Sensation: decreased proprioception LLE  Coordination: decreased gross motor    Cervical / Trunk Assessment Cervical / Trunk Assessment: Normal  Communication   Communication: No difficulties  Cognition Arousal/Alertness: Awake/alert Behavior During Therapy: WFL for tasks assessed/performed Overall Cognitive Status: Impaired/Different from baseline Area of Impairment: Memory;Problem solving                     Memory: Decreased short-term memory        Problem Solving: Slow processing;Difficulty sequencing;Requires verbal cues General Comments: pt reports his mental status is altered and will likely be until several months after seizures. His awareness of this is helpful      General Comments General comments (skin integrity, edema, etc.): VSS    Exercises General Exercises - Lower Extremity Ankle Circles/Pumps: AROM;Both;10 reps;Seated Long Arc Quad: AROM;Both;10 reps;Seated   Assessment/Plan    PT Assessment Patient needs continued PT services  PT Problem List Decreased cognition;Decreased coordination;Decreased mobility;Decreased balance;Decreased activity tolerance;Decreased strength;Decreased knowledge of use of DME       PT Treatment Interventions Gait training;DME instruction;Functional mobility training;Therapeutic activities;Therapeutic exercise;Balance training;Neuromuscular re-education;Patient/family education;Cognitive remediation    PT Goals (Current goals can be found in the Care Plan section)  Acute Rehab PT Goals Patient Stated Goal: return to [facility], "get my mind back" PT Goal Formulation: With patient Time For Goal Achievement: 11/17/18 Potential to Achieve Goals: Good    Frequency Min 3X/week   Barriers to discharge        Co-evaluation               AM-PAC PT "6 Clicks" Mobility  Outcome Measure Help needed turning from your back to your side while in a flat bed without using bedrails?: None Help needed moving from lying on your back to sitting on the side of a flat bed without using bedrails?: A Little Help needed moving to and from a bed to a chair (including a wheelchair)?: A Little Help needed standing up from a chair using your arms (e.g., wheelchair or bedside chair)?: A Little Help needed to walk in hospital room?: A Little Help needed climbing 3-5 steps with a railing? : A Little 6 Click Score: 19    End of Session Equipment Utilized During Treatment: Gait belt Activity  Tolerance: Patient tolerated treatment well Patient left: in chair;with call bell/phone within reach;with chair alarm set Nurse Communication: Mobility status PT Visit Diagnosis: Unsteadiness on feet (R26.81);Difficulty in walking, not elsewhere classified (R26.2)    Time: 3428-7681 PT Time Calculation (min) (ACUTE ONLY): 25 min   Charges:   PT Evaluation $PT Eval Moderate Complexity: 1 Mod PT Treatments $Gait Training: 8-22 mins        Leighton Roach, PT  Acute Rehab Services  Pager [phone] Office [place] 11/03/2018, 12:06 PM

## 2018-11-03 NOTE — Progress Notes (Signed)
ANTICOAGULATION CONSULT NOTE - Follow-Up Consult  Pharmacy Consult for warfarin Indication: atrial fibrillation   Patient Measurements: Height: 5\' 8"  (172.7 cm) Weight: 169 lb 8.5 oz (76.9 kg) IBW/kg (Calculated) : 68.4 Heparin Dosing Weight: 77.1 kg  Vital Signs: Temp: 98.2 F (36.8 C) (05/19 0520) Temp Source: Oral (05/19 0520) BP: 119/55 (05/19 0520) Pulse Rate: 60 (05/19 0520)  Labs: Recent Labs    11/02/18 1406 11/02/18 2113 11/03/18 0338  HGB 10.9*  --  11.6*  HCT 30.6*  --  31.1*  PLT 167  --  192  LABPROT  --  31.1* 33.4*  INR  --  3.1* 3.3*  CREATININE 0.79  --  0.70  CKTOTAL  --  330  --   TROPONINI <0.03  --   --     Estimated Creatinine Clearance: 71.3 mL/min (by C-G formula based on SCr of 0.7 mg/dL).  Medications:  Scheduled:  . ALPRAZolam  0.25 mg Oral BID  . brimonidine  1 drop Both Eyes BID  . finasteride  5 mg Oral Daily  . flecainide  50 mg Oral BID  . lacosamide  200 mg Oral BID  . latanoprost  1 drop Both Eyes QHS  . levETIRAcetam  1,500 mg Oral BID  . phenytoin  200 mg Oral BID  . phenytoin  30 mg Oral QODAY  . sodium chloride flush  3 mL Intravenous Q12H  . sodium chloride  1 g Oral Daily  . timolol  1 drop Both Eyes BID  . Warfarin - Pharmacist Dosing Inpatient   Does not apply q1800    Assessment: 89 YOM presenting with altered mental status, known history of seizures and atrial fibrillation on warfarin. Pharmacy consulted to resume dosing this admission.   PTA regimen is 2.5 mg daily.   The patient's INR today remains SUPRAtherapeutic (INR 3.3 << 3.1, goal of 2-3) despite a dose reduction yesterday. Will hold today's dose to allow INR to trend back down into range.CBC stable - no bleeding noted.   Goal of Therapy:  INR 2-3 Monitor platelets by anticoagulation protocol: Yes   Plan:  - Hold Warfarin dose today - Will continue to monitor for any signs/symptoms of bleeding and will follow up with PT/INR in the a.m.   Thank you  for allowing pharmacy to be a part of this patient's care.  Alycia Rossetti, PharmD, BCPS Clinical Pharmacist Clinical phone for 11/03/2018: [phone] 11/03/2018 10:48 AM   **Pharmacist phone directory can now be found on [email or web address] ([email or web address]).  Listed under [place].

## 2018-11-03 NOTE — TOC Initial Note (Signed)
Transition of Care [facility]) - Initial/Assessment Note    Patient Details  Name: Cameron Potts MRN: [id number] Date of Birth: 12/09/1935  [facility]) CM/SW Contact:    Geralynn Ochs, LCSW Phone Number: 11/03/2018, 4:46 PM  Clinical Narrative:   Patient admitted from [facility], wants to return. Recommendations are for home health services, patient agreeable to getting that set up. CSW left a Advertising account executive for social services director Butch Penny at [facility] to discuss patient's return, awaiting call back. CSW to follow.               Expected Discharge Plan: Assisted Living Barriers to Discharge: Continued Medical Work up   Patient Goals and CMS Choice Patient states their goals for this hospitalization and ongoing recovery are:: return home, get my mind back      Expected Discharge Plan and Services Expected Discharge Plan: Assisted Living     Post Acute Care Choice: [place] arrangements for the past 2 months: [place]                                      Prior Living Arrangements/Services Living arrangements for the past 2 months: [place] Lives with:: Self, Spouse          Need for Family Participation in Patient Care: No (Comment) Care giver support system in place?: Yes (comment)      Activities of Daily Living Home Assistive Devices/Equipment: Walker (specify type) ADL Screening (condition at time of admission) Patient's cognitive ability adequate to safely complete daily activities?: Yes Is the patient deaf or have difficulty hearing?: No Does the patient have difficulty seeing, even when wearing glasses/contacts?: No Does the patient have difficulty concentrating, remembering, or making decisions?: No Patient able to express need for assistance with ADLs?: Yes Does the patient have difficulty dressing or bathing?: No Independently performs ADLs?: Yes (appropriate for developmental age) Does the  patient have difficulty walking or climbing stairs?: No Weakness of Legs: None Weakness of Arms/Hands: None  Permission Sought/Granted                  Emotional Assessment   Attitude/Demeanor/Rapport: Engaged Affect (typically observed): Pleasant Orientation: : Oriented to Self, Oriented to Place, Oriented to  Time, Oriented to Situation Alcohol / Substance Use: Not Applicable Psych Involvement: No (comment)  Admission diagnosis:  Hyponatremia [E87.1] Fever, unspecified fever cause [R50.9] Altered mental status, unspecified altered mental status type [R41.82] Patient Active Problem List   Diagnosis Date Noted  . CAP (community acquired pneumonia) 11/03/2018  . Altered mental status 11/02/2018  . Hypercoagulability due to atrial fibrillation ([place]) 03/25/2018  . Primary open-angle glaucoma, bilateral, severe stage 03/17/2018  . ICD (implantable cardioverter-defibrillator) in place 03/13/2018  . Parkinsonian syndrome associated with symptomatic orthostatic hypotension ([place]) 03/13/2018  . Refractory epilepsy ([place]) 03/13/2018  . Status post VNS (vagus nerve stimulator) placement 03/13/2018  . 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 Neuropathy, peripheral 12/14/2017  . Hyponatremia 07/12/2017  . Presence of intraocular lens 04/23/2017  . Status epilepticus ([place]) 01/05/2017  . Chronic atrial fibrillation 01/04/2017  . Adjustment disorder with anxious mood 03/25/2014  . Age-related nuclear cataract of both eyes 03/25/2014  . 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  . Hyperlipidemia 03/25/2014  . Carcinoma in situ of prostate 02/08/2014  . Other localized visual field defect, bilateral 02/02/2014  . Abnormality of gait 12/21/2013  .  Complex partial seizure ([place]) 04/11/2013  . Seizure disorder ([place]) 04/10/2013  . Osteopenia 12/04/2011  . Vitamin D deficiency 12/04/2011   PCP:  Gayland Curry, DO Pharmacy:  No Pharmacies Listed    Social Determinants of Health (SDOH)  Interventions    Readmission Risk Interventions No flowsheet data found.

## 2018-11-03 NOTE — Progress Notes (Signed)
[facility] PROGRESS NOTE  Cameron Potts RWE:[id number] DOB: 1936/02/07 DOA: 11/02/2018 PCP: Gayland Curry, DO  Assessment/Plan:  Altered mental status: much improved this am. Oriented to person place and time.  etiology remains unclear I.e.  dehydration and hyponatremia versus whether patient had a seizure episode vs medication. CT head without acute abnormality.  No s/sx infection except fever. Possible side effect of recent initiation of zonisamide. -continue IV fluid as hyponatremia not resolved -continue to hold zonisamide -consider neuro consult   Hyponatremia: sodium trending down this am to 125. Hx of same. Presumably related to hypovolemia in setting of newly prescribed zonisamide and recent diarrhea and decreased po intake. Home meds include sodium chloride 1gm po daily. Urine sodium 86, urine osmolality 627, serum osmolality 264. Received  500 mL NS in ED -will resume home po supplement - continue maintenance IV fluids  - recheck sodium in a.m. -resume diet  Fever: max temp 101 rectal on presentation. Afebrile since admission.  No obvious infectious source.  He has no nuchal rigidity or meningismus.  Procalcitonin is 0.24. Blood cultures with no growth to date. Chest xray pending -monitor  Leukopenia: etiology unclear. Trending up to 3.6 this am.  Hemoglobin and platelets trending up as well.  -monitor  Seizure disorder s/p VNS: Follows closely with neurology, Dr. Jannifer Franklin.  On multiple antiepileptics and with VNS in place.  Recently started on zonisamide after seizure on 10/05/2018, which has been recommended to discontinue per neurology telephone note. -consider neuro consult -obtain EEG -DC zonisamide -Continue home alprazolam, Vimpat, Keppra, Dilantin -Seizure precautions  Atrial fibrillation: Sinus rhythm on admission.  He is on Coumadin for anticoagulation and flecainide for rhythm control. -Continue Coumadin per pharmacy -Continue flecainide    Code  Status: DNR Family Communication: wife on phone Disposition Plan: back to [facility]   Consultants:  none  Procedures:  eeg  Antibiotics:    HPI/Subjective: 83 yo hx seizure disorder, afib, hx cardiac arrest ICD admitted with encephalopthy, hyponatremia and fever.  No complaints this am. Denies fever/pain/discomfort  Objective: Vitals:   11/02/18 2021 11/03/18 0520  BP: (!) 129/59 (!) 119/55  Pulse: 61 60  Resp:    Temp: 97.6 F (36.4 C) 98.2 F (36.8 C)  SpO2: 100% 98%    Intake/Output Summary (Last 24 hours) at 11/03/2018 0949 Last data filed at 11/03/2018 0528 Gross per 24 hour  Intake 500 ml  Output 1000 ml  Net -500 ml   Filed Weights   11/02/18 1306 11/03/18 0500  Weight: 77.1 kg 76.9 kg    Exam:   General:  Awake alert oriented to self and place. No acute distress  Cardiovascular: rrr no mg ICD left chest no LE edema  Respiratory: normal effort BS clear bilaterally no wheeze  Abdomen: non-distended non tender +BS.  Musculoskeletal: joints without swelling/erythema   Data Reviewed: Basic Metabolic Panel: Recent Labs  Lab 11/02/18 1406 11/03/18 0338  NA 126* 125*  K 4.0 3.8  CL 93* 96*  CO2 21* 20*  GLUCOSE 108* 92  BUN 11 9  CREATININE 0.79 0.70  CALCIUM 8.5* 8.2*   Liver Function Tests: No results for input(s): AST, ALT, ALKPHOS, BILITOT, PROT, ALBUMIN in the last 168 hours. No results for input(s): LIPASE, AMYLASE in the last 168 hours. No results for input(s): AMMONIA in the last 168 hours. CBC: Recent Labs  Lab 11/02/18 1406 11/03/18 0338  WBC 2.2* 3.6*  NEUTROABS 0.7* 0.9*  HGB 10.9* 11.6*  HCT 30.6* 31.1*  MCV  88.4 87.4  PLT 167 192   Cardiac Enzymes: Recent Labs  Lab 11/02/18 1406 11/02/18 2113  CKTOTAL  --  330  TROPONINI <0.03  --    BNP (last 3 results) No results for input(s): BNP in the last 8760 hours.  ProBNP (last 3 results) No results for input(s): PROBNP in the last 8760 hours.  CBG: No  results for input(s): GLUCAP in the last 168 hours.  Recent Results (from the past 240 hour(s))  Culture, blood (routine x 2)     Status: None (Preliminary result)   Collection Time: 11/02/18  2:06 PM  Result Value Ref Range Status   Specimen Description SITE NOT SPECIFIED  Final   Special Requests   Final    BOTTLES DRAWN AEROBIC AND ANAEROBIC Blood Culture adequate volume   Culture   Final    NO GROWTH < 24 HOURS Performed at [facility], [address] [place], [place] [address]    Report Status PENDING  Incomplete  SARS Coronavirus 2 (CEPHEID- Performed in [facility] hospital lab), Hosp Order     Status: None   Collection Time: 11/02/18  2:29 PM  Result Value Ref Range Status   SARS Coronavirus 2 NEGATIVE NEGATIVE Final    Comment: (NOTE) If result is NEGATIVE SARS-CoV-2 target nucleic acids are NOT DETECTED. The SARS-CoV-2 RNA is generally detectable in upper and lower  respiratory specimens during the acute phase of infection. The lowest  concentration of SARS-CoV-2 viral copies this assay can detect is 250  copies / mL. A negative result does not preclude SARS-CoV-2 infection  and should not be used as the sole basis for treatment or other  patient management decisions.  A negative result may occur with  improper specimen collection / handling, submission of specimen other  than nasopharyngeal swab, presence of viral mutation(s) within the  areas targeted by this assay, and inadequate number of viral copies  (<250 copies / mL). A negative result must be combined with clinical  observations, patient history, and epidemiological information. If result is POSITIVE SARS-CoV-2 target nucleic acids are DETECTED. The SARS-CoV-2 RNA is generally detectable in upper and lower  respiratory specimens dur ing the acute phase of infection.  Positive  results are indicative of active infection with SARS-CoV-2.  Clinical  correlation with patient history and other diagnostic  information is  necessary to determine patient infection status.  Positive results do  not rule out bacterial infection or co-infection with other viruses. If result is PRESUMPTIVE POSTIVE SARS-CoV-2 nucleic acids MAY BE PRESENT.   A presumptive positive result was obtained on the submitted specimen  and confirmed on repeat testing.  While 2019 novel coronavirus  (SARS-CoV-2) nucleic acids may be present in the submitted sample  additional confirmatory testing may be necessary for epidemiological  and / or clinical management purposes  to differentiate between  SARS-CoV-2 and other Sarbecovirus currently known to infect humans.  If clinically indicated additional testing with an alternate test  methodology [phone]) is advised. The SARS-CoV-2 RNA is generally  detectable in upper and lower respiratory sp ecimens during the acute  phase of infection. The expected result is Negative. Fact Sheet for Patients:  StrictlyIdeas.no Fact Sheet for Healthcare Providers: BankingDealers.co.za This test is not yet approved or cleared by the [place] FDA and has been authorized for detection and/or diagnosis of SARS-CoV-2 by FDA under an Emergency Use Authorization (EUA).  This EUA will remain in effect (meaning this test can be used)  for the duration of the COVID-19 declaration under Section 564(b)(1) of the Act, 21 U.S.C. section 360bbb-3(b)(1), unless the authorization is terminated or revoked sooner. Performed at [facility], [place] [address] , [place] [address]   Culture, blood (routine x 2)     Status: None (Preliminary result)   Collection Time: 11/02/18  2:38 PM  Result Value Ref Range Status   Specimen Description BLOOD RIGHT ARM  Final   Special Requests   Final    BOTTLES DRAWN AEROBIC AND ANAEROBIC Blood Culture results may not be optimal due to an inadequate volume of blood received in culture bottles   Culture   Final     NO GROWTH < 24 HOURS Performed at [facility], [place] [address], [place], [place] [address]    Report Status PENDING  Incomplete  MRSA PCR Screening     Status: None   Collection Time: 11/03/18  6:06 AM  Result Value Ref Range Status   MRSA by PCR NEGATIVE NEGATIVE Final    Comment:        The GeneXpert MRSA Assay (FDA approved for NASAL specimens only), is one component of a comprehensive MRSA colonization surveillance program. It is not intended to diagnose MRSA infection nor to guide or monitor treatment for MRSA infections. Performed at [facility], [place] [address] [place], [place] [address]      Studies: Ct Head Wo Contrast  Result Date: 11/02/2018 CLINICAL DATA:  83 year old nursing home patient with progressively worsening confusion over the past 3 days, associated with fever. EXAM: CT HEAD WITHOUT CONTRAST TECHNIQUE: Contiguous axial images were obtained from the base of the skull through the vertex without intravenous contrast. COMPARISON:  10/05/2018 and earlier. FINDINGS: Brain: Patient motion blurred images of the skull base. Moderate cortical and deep atrophy, unchanged dating back to 2019. Mild changes of small vessel disease of the white matter, with a more focal white matter stroke involving the RIGHT POSTERIOR frontal lobe, unchanged. Encephalomalacia involving the RIGHT temporal lobe, unchanged. No mass lesion. No midline shift. No acute hemorrhage or hematoma. No extra-axial fluid collections. No evidence of acute infarction. Vascular: Severe BILATERAL carotid siphon and BILATERAL vertebral artery atherosclerosis. No hyperdense vessel. Skull: Prior RIGHT temporal craniotomy. No skull fracture or focal osseous abnormality elsewhere involving the skull. Sinuses/Orbits: Mucosal thickening involving the maxillary sinuses. Apparent prior BILATERAL maxillary sinus MEDIAL antrostomies. Opacification of ANTERIOR ethmoid air cells bilaterally. Mild mucosal  thickening involving the RIGHT frontal sinus. Sphenoid sinuses well aerated. BILATERAL mastoid air cells and BILATERAL middle ear cavities well-aerated. Benign senile calcifications involving both eyes. Other: None. IMPRESSION: 1. No acute intracranial abnormality. 2. Stable moderate generalized atrophy and mild chronic microvascular ischemic changes of the white matter. 3. Stable encephalomalacia involving the right temporal lobe. 4. Mild chronic bilateral maxillary, bilateral ethmoid and right frontal sinus disease. Electronically Signed   By: Evangeline Dakin M.D.   On: 11/02/2018 16:41   Dg Chest Portable 1 View  Result Date: 11/02/2018 CLINICAL DATA:  Altered mental status. EXAM: PORTABLE CHEST 1 VIEW COMPARISON:  None. FINDINGS: The heart size and mediastinal contours are within normal limits. Both lungs are clear. Left-sided pacemaker is noted. No pneumothorax or pleural effusion is noted. The visualized skeletal structures are unremarkable. IMPRESSION: No active disease. Electronically Signed   By: Marijo Conception M.D.   On: 11/02/2018 13:50    Scheduled Meds: . ALPRAZolam  0.25 mg Oral BID  . brimonidine  1 drop  Both Eyes BID  . finasteride  5 mg Oral Daily  . flecainide  50 mg Oral BID  . lacosamide  200 mg Oral BID  . latanoprost  1 drop Both Eyes QHS  . levETIRAcetam  1,500 mg Oral BID  . phenytoin  200 mg Oral BID  . phenytoin  30 mg Oral QODAY  . sodium chloride flush  3 mL Intravenous Q12H  . timolol  1 drop Both Eyes BID  . Warfarin - Pharmacist Dosing Inpatient   Does not apply q1800   Continuous Infusions:  Principal Problem:   Altered mental status Active Problems:   Seizure disorder (HCC)   Chronic atrial fibrillation   Hyponatremia    Time spent: 35 minutes    [place] NP  [facility]  If 7PM-7AM, please contact night-coverage at [email or web address], password [facility] 11/03/2018, 9:49 AM  LOS: 0 days

## 2018-11-03 NOTE — Plan of Care (Signed)
  Problem: Health Behavior/Discharge Planning: Goal: Ability to manage health-related needs will improve Outcome: Progressing   Problem: Clinical Measurements: Goal: Will remain free from infection Outcome: Progressing   

## 2018-11-03 NOTE — Plan of Care (Signed)
  Problem: Education: Goal: Knowledge of General Education information will improve Description: Including pain rating scale, medication(s)/side effects and non-pharmacologic comfort measures Outcome: Progressing   Problem: Health Behavior/Discharge Planning: Goal: Ability to manage health-related needs will improve Outcome: Progressing   Problem: Clinical Measurements: Goal: Ability to maintain clinical measurements within normal limits will improve Outcome: Progressing Goal: Will remain free from infection Outcome: Progressing Goal: Diagnostic test results will improve Outcome: Progressing   Problem: Activity: Goal: Risk for activity intolerance will decrease Outcome: Progressing   Problem: Nutrition: Goal: Adequate nutrition will be maintained Outcome: Progressing   Problem: Pain Managment: Goal: General experience of comfort will improve Outcome: Progressing   Problem: Safety: Goal: Ability to remain free from injury will improve Outcome: Progressing   Problem: Skin Integrity: Goal: Risk for impaired skin integrity will decrease Outcome: Progressing   

## 2018-11-04 DIAGNOSIS — R400 Somnolence: Secondary | ICD-10-CM

## 2018-11-04 DIAGNOSIS — J181 Lobar pneumonia, unspecified organism: Secondary | ICD-10-CM

## 2018-11-04 LAB — CBC
HCT: 29.4 % — ABNORMAL LOW (ref 39.0–52.0)
Hemoglobin: 10.9 g/dL — ABNORMAL LOW (ref 13.0–17.0)
MCH: 32.9 pg (ref 26.0–34.0)
MCHC: 37.1 g/dL — ABNORMAL HIGH (ref 30.0–36.0)
MCV: 88.8 fL (ref 80.0–100.0)
Platelets: 190 10*3/uL (ref 150–400)
RBC: 3.31 MIL/uL — ABNORMAL LOW (ref 4.22–5.81)
RDW: 12.4 % (ref 11.5–15.5)
WBC: 3.5 10*3/uL — ABNORMAL LOW (ref 4.0–10.5)
nRBC: 0 % (ref 0.0–0.2)

## 2018-11-04 LAB — BASIC METABOLIC PANEL
Anion gap: 8 (ref 5–15)
BUN: 7 mg/dL — ABNORMAL LOW (ref 8–23)
CO2: 24 mmol/L (ref 22–32)
Calcium: 8.4 mg/dL — ABNORMAL LOW (ref 8.9–10.3)
Chloride: 102 mmol/L (ref 98–111)
Creatinine, Ser: 0.68 mg/dL (ref 0.61–1.24)
GFR calc Af Amer: >60 mL/min (ref >60)
GFR calc non Af Amer: >60 mL/min (ref >60)
Glucose, Bld: 99 mg/dL (ref 70–99)
Potassium: 4.2 mmol/L (ref 3.5–5.1)
Sodium: 134 mmol/L — ABNORMAL LOW (ref 135–145)

## 2018-11-04 LAB — PHENYTOIN LEVEL, TOTAL: Phenytoin Lvl: 14.7 ug/mL (ref 10.0–20.0)

## 2018-11-04 LAB — PROTIME-INR
INR: 2.6 — ABNORMAL HIGH (ref 0.8–1.2)
Prothrombin Time: 27.6 seconds — ABNORMAL HIGH (ref 11.4–15.2)

## 2018-11-04 MED ORDER — AZITHROMYCIN 500 MG PO TABS: 500.0000 mg | ORAL_TABLET | Freq: Every day | ORAL | 0 refills | Status: AC

## 2018-11-04 MED ORDER — CEFPODOXIME PROXETIL 100 MG PO TABS: 100.0000 mg | ORAL_TABLET | Freq: Two times a day (BID) | ORAL | 0 refills | Status: AC

## 2018-11-04 MED ORDER — AZITHROMYCIN 500 MG PO TABS
500.0000 mg | ORAL_TABLET | Freq: Every day | ORAL | Status: DC
  Administered 2018-11-04: 500 mg via ORAL
  Filled 2018-11-04: qty 1

## 2018-11-04 MED ORDER — WARFARIN SODIUM 2 MG PO TABS: 2.0000 mg | ORAL_TABLET | Freq: Once | ORAL | Status: DC

## 2018-11-04 NOTE — Plan of Care (Signed)

## 2018-11-04 NOTE — TOC Transition Note (Signed)
Transition of Care [facility]) - CM/SW Discharge Note   Patient Details  Name: Cameron Potts MRN: [id number] Date of Birth: 03-12-1936  [facility]) CM/SW Contact:  Benard Halsted, LCSW Phone Number: 11/04/2018, 10:16 AM   Clinical Narrative:    Patient will DC to: [facility] Anticipated DC date: 11/04/18 Family notified: Patient contacted family Transport by: Jacqlyn Larsen   Per MD patient ready for DC to [facility]. RN, patient, patient's family, and facility notified of DC. Discharge Summary and FL2 sent to facility. RN to call report prior to discharge [phone] and ask for patient's RN).    CSW will sign off for now as social work intervention is no longer needed. Please consult [place] again if new needs arise.  Cedric Fishman, LCSW Clinical Social Worker [phone]      Barriers to Discharge: Continued Medical Work up   Patient Goals and CMS Choice Patient states their goals for this hospitalization and ongoing recovery are:: return home, get my mind back      Discharge Placement                       Discharge Plan and Services     Post Acute Care Choice: Home Health                               Social Determinants of Health (SDOH) Interventions     Readmission Risk Interventions No flowsheet data found.

## 2018-11-04 NOTE — Discharge Summary (Signed)
Physician Discharge Summary   Patient ID: Cameron Potts MRN: [id number] DOB/AGE: [id number] y.o.  Admit date: 11/02/2018 Discharge date: 11/04/2018  Primary Care Physician:  Gayland Curry, DO   Recommendations for Outpatient Follow-up:  1. Follow up with PCP in 1-2 weeks 2. Follow bmet 3. Zonisamide has been discontinued 4. COVID-19 negative  Home Health: Patient returning back to assisted living facility, home health PT Equipment/Devices:   Discharge Condition: stable  CODE STATUS:  DNR   Diet recommendation: Regular diet   Discharge Diagnoses:    . Acute metabolic encephalopathy . Chronic atrial fibrillation . Acute on chronic hyponatremia . CAP (community acquired pneumonia) History of seizure disorder status post VNS   Consults:  None     Allergies:   Allergies  Allergen Reactions  . Depakote [Divalproex Sodium] Other (See Comments)    Arthralgias      DISCHARGE MEDICATIONS: Allergies as of 11/04/2018      Reactions   Depakote [divalproex Sodium] Other (See Comments)   Arthralgias      Medication List    TAKE these medications   acetaminophen 650 MG CR tablet Commonly known as:  TYLENOL Take 650 mg by mouth every 4 (four) hours as needed for pain (up to 72 hours).   ALPRAZolam 0.25 MG tablet Commonly known as:  XANAX Take 1 tablet (0.25 mg total) by mouth 2 (two) times daily.   Ativan 2 MG/ML injection Generic drug:  LORazepam Inject 2 mg into the muscle as needed for seizure. NOT TO BE USED FOR BEHAVIORS   azithromycin 500 MG tablet Commonly known as:  ZITHROMAX Take 1 tablet (500 mg total) by mouth daily for 6 days.   bimatoprost 0.01 % Soln Commonly known as:  LUMIGAN Place 1 drop into both eyes at bedtime.   cefpodoxime 100 MG tablet Commonly known as:  VANTIN Take 1 tablet (100 mg total) by mouth 2 (two) times daily for 6 days.   cholecalciferol 10 MCG (400 UNIT) Tabs tablet Commonly known as:  VITAMIN D3 Take 400 Units by  mouth 2 (two) times daily.   Combigan 0.2-0.5 % ophthalmic solution Generic drug:  brimonidine-timolol Place 1 drop into both eyes every 12 (twelve) hours.   finasteride 5 MG tablet Commonly known as:  PROSCAR Take 5 mg by mouth daily.   flecainide 50 MG tablet Commonly known as:  TAMBOCOR Take 50 mg by mouth 2 (two) times daily.   fluocinonide ointment 0.05 % Commonly known as:  LIDEX Apply 1 application topically 2 (two) times daily as needed (on rash /skin folds, not for face).   lacosamide 200 MG Tabs tablet Commonly known as:  VIMPAT Take 1 tablet (200 mg total) by mouth 2 (two) times daily.   latanoprost 0.005 % ophthalmic solution Commonly known as:  XALATAN Place 1 drop into both eyes at bedtime.   levETIRAcetam 750 MG tablet Commonly known as:  KEPPRA Take 2 tablets (1,500 mg total) by mouth 2 (two) times daily.   phenytoin 100 MG ER capsule Commonly known as:  DILANTIN Take 2 capsules (200 mg total) by mouth 2 (two) times daily.   phenytoin 30 MG ER capsule Commonly known as:  Dilantin Take 1 capsule (30 mg total) by mouth every other day.   polyethylene glycol 17 g packet Commonly known as:  MIRALAX / GLYCOLAX Take 17 g by mouth every other day.   sodium chloride 1 g tablet Take 1 g by mouth daily.   triamcinolone cream 0.1 %  Commonly known as:  KENALOG Apply 1 application topically 2 (two) times daily as needed (for flares).   warfarin 2.5 MG tablet Commonly known as:  COUMADIN Take 1 tablet (2.5 mg total) by mouth daily at 12 noon. What changed:    when to take this  additional instructions        Brief H and P: For complete details please refer to admission H and P, but in brief patient is a 83 year old male with history of chronic atrial fibrillation on Coumadin, seizure disorder status post VNS, history of cardiac arrest status post ICD presented from retirement facility with altered mental status.  Patient was noticed to be confused for  last 3 days PTA, had a fever and was only oriented to name on the morning of admission.  During examination by MD in ED, patient was alert and oriented.  He reported that he had recently been started on zonisamide.  Since then he felt like that he had not been feeling himself, reported recent loose stools and dehydration. Sodium was noted to be 126, COVID-19 test negative.  Chest x-ray showed left-sided pneumonia.   Hospital Course:   Acute metabolic encephalopathy -Resolved, completely oriented to time place and person, wants to return back to the facility. -Possibly due to dehydration, acute hyponatremia, medication effect from zonisamide versus pneumonia -CT head showed no acute abnormality. -PT evaluation done, back to baseline, needs home health PT  Acute on chronic hyponatremia, hypoosmolar hyponatremia -Presumably due to hypovolemia in the setting of newly prescribed zonisamide, recent diarrhea and decreased p.o. intake -Patient takes sodium chloride tabs 1 g p.o. daily PTA, to continue -Sodium 125 on 5/19, was gently hydrated, sodium 134 at the time of discharge.  Serum osmolality 264     Seizure disorder ([place]) -Patient is closely followed by Dr. Jannifer Franklin, on multiple antiepileptics and VNS -Patient was recently started on zonisamide after seizure in 09/2018 however patient feels " not himself since then" -Reviewed Dr. Jannifer Franklin' note, had recommended to discontinue zonisamide -Continue home medications including alprazolam, Vimpat, Keppra, Dilantin and follow outpatient with Dr. Jannifer Franklin -EEG during hospitalization did not show acute epileptiform discharges    Chronic atrial fibrillation -Sinus rhythm on admission, continue Coumadin and flecainide     CAP (community acquired pneumonia) -Doing well, no fevers, chest x-ray showed left lower lobe opacity -Patient was placed on IV Zithromax and Rocephin, transition to oral Zithromax and Vantin for 6 more days to complete full  course   Day of Discharge S: No fevers or chills, doing well, wants to return back to facility today, alert and oriented x3.  BP (!) 134/58 (BP Location: Left Arm)   Pulse (!) 59   Temp 97.7 F (36.5 C) (Oral)   Resp 18   Ht 5\' 8"  (1.727 m)   Wt 75.9 kg   SpO2 98%   BMI 25.44 kg/m   Physical Exam: General: Alert and awake oriented x3 not in any acute distress. HEENT: anicteric sclera, pupils reactive to light and accommodation CVS: S1-S2 clear no murmur rubs or gallops Chest: clear to auscultation bilaterally, no wheezing rales or rhonchi Abdomen: soft nontender, nondistended, normal bowel sounds Extremities: no cyanosis, clubbing or edema noted bilaterally Neuro: Cranial nerves II-XII intact, no focal neurological deficits   The results of significant diagnostics from this hospitalization (including imaging, microbiology, ancillary and laboratory) are listed below for reference.      Procedures/Studies:  Dg Chest 2 View  Result Date: 11/03/2018 CLINICAL DATA:  Fever. EXAM:  CHEST - 2 VIEW COMPARISON:  11/02/2018 FINDINGS: ICD and vagal nerve stimulator remain in place. The cardiomediastinal silhouette is unchanged with normal heart size. Aortic atherosclerosis is noted. The lungs are well inflated. There is focal posterior basilar opacity on the lateral radiograph which may be in the left lower lobe. The lungs are otherwise clear. No pleural effusion or pneumothorax is identified. No acute osseous abnormality is seen. IMPRESSION: Basilar left lower lobe opacity which may reflect pneumonia. Electronically Signed   By: Logan Bores M.D.   On: 11/03/2018 11:37   Ct Head Wo Contrast  Result Date: 11/02/2018 CLINICAL DATA:  83 year old nursing home patient with progressively worsening confusion over the past 3 days, associated with fever. EXAM: CT HEAD WITHOUT CONTRAST TECHNIQUE: Contiguous axial images were obtained from the base of the skull through the vertex without intravenous  contrast. COMPARISON:  10/05/2018 and earlier. FINDINGS: Brain: Patient motion blurred images of the skull base. Moderate cortical and deep atrophy, unchanged dating back to 2019. Mild changes of small vessel disease of the white matter, with a more focal white matter stroke involving the RIGHT POSTERIOR frontal lobe, unchanged. Encephalomalacia involving the RIGHT temporal lobe, unchanged. No mass lesion. No midline shift. No acute hemorrhage or hematoma. No extra-axial fluid collections. No evidence of acute infarction. Vascular: Severe BILATERAL carotid siphon and BILATERAL vertebral artery atherosclerosis. No hyperdense vessel. Skull: Prior RIGHT temporal craniotomy. No skull fracture or focal osseous abnormality elsewhere involving the skull. Sinuses/Orbits: Mucosal thickening involving the maxillary sinuses. Apparent prior BILATERAL maxillary sinus MEDIAL antrostomies. Opacification of ANTERIOR ethmoid air cells bilaterally. Mild mucosal thickening involving the RIGHT frontal sinus. Sphenoid sinuses well aerated. BILATERAL mastoid air cells and BILATERAL middle ear cavities well-aerated. Benign senile calcifications involving both eyes. Other: None. IMPRESSION: 1. No acute intracranial abnormality. 2. Stable moderate generalized atrophy and mild chronic microvascular ischemic changes of the white matter. 3. Stable encephalomalacia involving the right temporal lobe. 4. Mild chronic bilateral maxillary, bilateral ethmoid and right frontal sinus disease. Electronically Signed   By: Evangeline Dakin M.D.   On: 11/02/2018 16:41   Ct Head Wo Contrast  Result Date: 10/05/2018 CLINICAL DATA:  Altered mental status. Seizure. EXAM: CT HEAD WITHOUT CONTRAST TECHNIQUE: Contiguous axial images were obtained from the base of the skull through the vertex without intravenous contrast. COMPARISON:  03/08/2018 FINDINGS: Brain: Unchanged right temporal lobe encephalomalacia. Area of hypoattenuation superior right frontal  lobe is also unchanged. No intracranial hemorrhage. No evidence for acute cortical infarct. Vascular: No abnormal hyperdensity of the major intracranial arteries or dural venous sinuses. No intracranial atherosclerosis. Skull: Old right temporal craniotomy. Sinuses/Orbits: Mild paranasal sinus mucosal thickening. No fluid levels. Small left mastoid effusion. The orbits are normal. IMPRESSION: 1. No acute intracranial abnormality. 2. Unchanged right temporal encephalomalacia. Electronically Signed   By: Ulyses Jarred M.D.   On: 10/05/2018 21:51   Dg Chest Portable 1 View  Result Date: 11/02/2018 CLINICAL DATA:  Altered mental status. EXAM: PORTABLE CHEST 1 VIEW COMPARISON:  None. FINDINGS: The heart size and mediastinal contours are within normal limits. Both lungs are clear. Left-sided pacemaker is noted. No pneumothorax or pleural effusion is noted. The visualized skeletal structures are unremarkable. IMPRESSION: No active disease. Electronically Signed   By: Marijo Conception M.D.   On: 11/02/2018 13:50       LAB RESULTS: Basic Metabolic Panel: Recent Labs  Lab 11/03/18 0338 11/04/18 0247  NA 125* 134*  K 3.8 4.2  CL 96* 102  CO2  20* 24  GLUCOSE 92 99  BUN 9 7*  CREATININE 0.70 0.68  CALCIUM 8.2* 8.4*   Liver Function Tests: No results for input(s): AST, ALT, ALKPHOS, BILITOT, PROT, ALBUMIN in the last 168 hours. No results for input(s): LIPASE, AMYLASE in the last 168 hours. No results for input(s): AMMONIA in the last 168 hours. CBC: Recent Labs  Lab 11/03/18 0338 11/04/18 0247  WBC 3.6* 3.5*  NEUTROABS 0.9*  --   HGB 11.6* 10.9*  HCT 31.1* 29.4*  MCV 87.4 88.8  PLT 192 190   Cardiac Enzymes: Recent Labs  Lab 11/02/18 1406 11/02/18 2113  CKTOTAL  --  330  TROPONINI <0.03  --    BNP: Invalid input(s): POCBNP CBG: No results for input(s): GLUCAP in the last 168 hours.    Disposition and Follow-up: Discharge Instructions    Call MD for:  difficulty breathing,  headache or visual disturbances   Complete by:  As directed    Call MD for:  persistant nausea and vomiting   Complete by:  As directed    Call MD for:  temperature >100.4   Complete by:  As directed    Diet general   Complete by:  As directed    Increase activity slowly   Complete by:  As directed        DISPOSITION: Assisted living facility with home health PT   [place] information for follow-up providers    Reed, Carlock, DO. Schedule an appointment as soon as possible for a visit in 1 week(s).   Specialty:  Geriatric Medicine Contact information: [place] [address] [phone]            Contact information for after-discharge care    Destination    [facility] .   Service:  Assisted Living Contact information: [address] [place] [phone]                   Time coordinating discharge:  35 minutes  Signed:   Estill Cotta M.D. [facility] 11/04/2018, 9:44 AM

## 2018-11-04 NOTE — Progress Notes (Signed)
Pt refused vitals @ 0500. Pt reported that he wanted more sleep and would prefer vitals to be taken @ 0700

## 2018-11-04 NOTE — Progress Notes (Signed)
ANTICOAGULATION CONSULT NOTE - Follow-Up Consult  Pharmacy Consult for warfarin Indication: atrial fibrillation   Patient Measurements: Height: 5\' 8"  (172.7 cm) Weight: 167 lb 5.3 oz (75.9 kg) IBW/kg (Calculated) : 68.4 Heparin Dosing Weight: 77.1 kg  Vital Signs: Temp: 97.7 F (36.5 C) (05/19 2149) Temp Source: Oral (05/19 2149) BP: 134/58 (05/19 2149) Pulse Rate: 59 (05/19 2149)  Labs: Recent Labs    11/02/18 1406 11/02/18 2113 11/03/18 0338 11/04/18 0247  HGB 10.9*  --  11.6* 10.9*  HCT 30.6*  --  31.1* 29.4*  PLT 167  --  192 190  LABPROT  --  31.1* 33.4* 27.6*  INR  --  3.1* 3.3* 2.6*  CREATININE 0.79  --  0.70 0.68  CKTOTAL  --  330  --   --   TROPONINI <0.03  --   --   --     Estimated Creatinine Clearance: 71.3 mL/min (by C-G formula based on SCr of 0.68 mg/dL).  Medications:  Scheduled:  . ALPRAZolam  0.25 mg Oral BID  . brimonidine  1 drop Both Eyes BID  . finasteride  5 mg Oral Daily  . flecainide  50 mg Oral BID  . lacosamide  200 mg Oral BID  . latanoprost  1 drop Both Eyes QHS  . levETIRAcetam  1,500 mg Oral BID  . phenytoin  200 mg Oral BID  . phenytoin  30 mg Oral QODAY  . sodium chloride flush  3 mL Intravenous Q12H  . sodium chloride  1 g Oral Daily  . timolol  1 drop Both Eyes BID  . Warfarin - Pharmacist Dosing Inpatient   Does not apply q1800    Assessment: 3 YOM presenting with altered mental status, known history of seizures and atrial fibrillation on warfarin. Pharmacy consulted to resume dosing this admission.   PTA regimen is 2.5 mg daily.   The patient's INR today is therapeutic (INR 2.6<<3.3 << 3.1, goal of 2-3) after holding dose yesterday. CBC stable - no bleeding noted.   Goal of Therapy:  INR 2-3 Monitor platelets by anticoagulation protocol: Yes   Plan:  - Warfarin 2mg  PO x 1 - Will continue to monitor for any signs/symptoms of bleeding and will follow up with PT/INR in the a.m.   Cameron Potts A. Levada Dy, PharmD,  [place] Please utilize Amion for appropriate phone number to reach the unit pharmacist ([place])

## 2018-11-04 NOTE — Progress Notes (Signed)
Nsg Discharge Note  Admit Date:  11/02/2018 Discharge date: 11/04/2018   Mattias Walmsley to be D/C'd [facility] per MD order.  AVS completed.  Copy for chart, and copy for patient signed, and dated. Patient/caregiver able to verbalize understanding.  Discharge Medication: Allergies as of 11/04/2018      Reactions   Depakote [divalproex Sodium] Other (See Comments)   Arthralgias      Medication List    TAKE these medications   acetaminophen 650 MG CR tablet Commonly known as:  TYLENOL Take 650 mg by mouth every 4 (four) hours as needed for pain (up to 72 hours).   ALPRAZolam 0.25 MG tablet Commonly known as:  XANAX Take 1 tablet (0.25 mg total) by mouth 2 (two) times daily.   Ativan 2 MG/ML injection Generic drug:  LORazepam Inject 2 mg into the muscle as needed for seizure. NOT TO BE USED FOR BEHAVIORS   azithromycin 500 MG tablet Commonly known as:  ZITHROMAX Take 1 tablet (500 mg total) by mouth daily for 6 days. Notes to patient:  Take for 6 days only   bimatoprost 0.01 % Soln Commonly known as:  LUMIGAN Place 1 drop into both eyes at bedtime.   cefpodoxime 100 MG tablet Commonly known as:  VANTIN Take 1 tablet (100 mg total) by mouth 2 (two) times daily for 6 days.   cholecalciferol 10 MCG (400 UNIT) Tabs tablet Commonly known as:  VITAMIN D3 Take 400 Units by mouth 2 (two) times daily.   Combigan 0.2-0.5 % ophthalmic solution Generic drug:  brimonidine-timolol Place 1 drop into both eyes every 12 (twelve) hours.   finasteride 5 MG tablet Commonly known as:  PROSCAR Take 5 mg by mouth daily.   flecainide 50 MG tablet Commonly known as:  TAMBOCOR Take 50 mg by mouth 2 (two) times daily.   fluocinonide ointment 0.05 % Commonly known as:  LIDEX Apply 1 application topically 2 (two) times daily as needed (on rash /skin folds, not for face).   lacosamide 200 MG Tabs tablet Commonly known as:  VIMPAT Take 1 tablet (200 mg total) by mouth 2 (two) times  daily.   latanoprost 0.005 % ophthalmic solution Commonly known as:  XALATAN Place 1 drop into both eyes at bedtime.   levETIRAcetam 750 MG tablet Commonly known as:  KEPPRA Take 2 tablets (1,500 mg total) by mouth 2 (two) times daily.   phenytoin 100 MG ER capsule Commonly known as:  DILANTIN Take 2 capsules (200 mg total) by mouth 2 (two) times daily.   phenytoin 30 MG ER capsule Commonly known as:  Dilantin Take 1 capsule (30 mg total) by mouth every other day.   polyethylene glycol 17 g packet Commonly known as:  MIRALAX / GLYCOLAX Take 17 g by mouth every other day.   sodium chloride 1 g tablet Take 1 g by mouth daily.   triamcinolone cream 0.1 % Commonly known as:  KENALOG Apply 1 application topically 2 (two) times daily as needed (for flares).   warfarin 2.5 MG tablet Commonly known as:  COUMADIN Take 1 tablet (2.5 mg total) by mouth daily at 12 noon. What changed:    when to take this  additional instructions       Discharge Assessment: Vitals:   11/03/18 1559 11/03/18 2149  BP: (!) 91/52 (!) 134/58  Pulse: 60 (!) 59  Resp: 18 18  Temp: 97.6 F (36.4 C) 97.7 F (36.5 C)  SpO2: 98% 98%   Skin clean,  dry and intact without evidence of skin break down, no evidence of skin tears noted. IV catheter discontinued intact. Site without signs and symptoms of complications - no redness or edema noted at insertion site, patient denies c/o pain - only slight tenderness at site.  Dressing with slight pressure applied.  D/c Instructions-Education: Discharge instructions given to patient/family with verbalized understanding. D/c education completed with patient/family including follow up instructions, medication list, d/c activities limitations if indicated, with other d/c instructions as indicated by MD - patient able to verbalize understanding, all questions fully answered. Patient instructed to return to ED, call 911, or call MD for any changes in condition.   Patient escorted via [place] to main entrance and will be transported by [facility] which is scheduled to arrive between 12-1p. Report given to nurse at [facility].   Salley Slaughter, MSN, RN, [facility] 11/04/2018 11:08 AM

## 2018-11-04 NOTE — NC FL2 (Signed)
[place] MEDICAID FL2 LEVEL OF CARE SCREENING TOOL     IDENTIFICATION  Patient Name: Cameron Potts Birthdate: June 02, 1936 Sex: male Admission Date (Current Location): 11/02/2018  [facility] and [place] Number:  Herbalist and Address:  [facility] [place], [place] [address] [place], [place] [address]      Provider Number: [id number]  Attending Physician Name and Address:  Mendel Corning, MD  Relative Name and Phone Number:  Opal Sidles, spouse, [phone]    Current Level of Care: Hospital Recommended Level of Care: [place] Prior Approval Number:    Date Approved/Denied:   PASRR Number:    Discharge Plan: Other (Comment)(ALF)    Current Diagnoses: Patient Active Problem List   Diagnosis Date Noted  . CAP (community acquired pneumonia) 11/03/2018  . Altered mental status 11/02/2018  . Hypercoagulability due to atrial fibrillation ([place]) 03/25/2018  . Primary open-angle glaucoma, bilateral, severe stage 03/17/2018  . ICD (implantable cardioverter-defibrillator) in place 03/13/2018  . Parkinsonian syndrome associated with symptomatic orthostatic hypotension ([place]) 03/13/2018  . Refractory epilepsy ([place]) 03/13/2018  . Status post VNS (vagus nerve stimulator) placement 03/13/2018  . 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 Neuropathy, peripheral 12/14/2017  . Hyponatremia 07/12/2017  . Presence of intraocular lens 04/23/2017  . Status epilepticus ([place]) 01/05/2017  . Chronic atrial fibrillation 01/04/2017  . Adjustment disorder with anxious mood 03/25/2014  . Age-related nuclear cataract of both eyes 03/25/2014  . 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  . Hyperlipidemia 03/25/2014  . Carcinoma in situ of prostate 02/08/2014  . Other localized visual field defect, bilateral 02/02/2014  . Abnormality of gait 12/21/2013  . Complex partial seizure ([place]) 04/11/2013  . Seizure disorder ([place]) 04/10/2013  . Osteopenia 12/04/2011  . Vitamin D  deficiency 12/04/2011    Orientation RESPIRATION BLADDER Height & Weight     Self, Time, Situation, Place  Normal Continent Weight: 167 lb 5.3 oz (75.9 kg) Height:  5\' 8"  (172.7 cm)  BEHAVIORAL SYMPTOMS/MOOD NEUROLOGICAL BOWEL NUTRITION STATUS      Continent Diet(Regular)  AMBULATORY STATUS COMMUNICATION OF NEEDS Skin   Supervision Verbally Normal                       Personal Care Assistance Level of Assistance  Bathing, Feeding, Dressing Bathing Assistance: Limited assistance Feeding assistance: Independent Dressing Assistance: Limited assistance     Functional Limitations Info  Sight, Hearing, Speech Sight Info: Impaired Hearing Info: Adequate Speech Info: Adequate    SPECIAL CARE FACTORS FREQUENCY  PT (By licensed PT), OT (By licensed OT)     PT Frequency: Home Health PT 5x/week OT Frequency: Home Health OT 3x/week            Contractures Contractures Info: Not present    Additional Factors Info  Code Status, Allergies, Psychotropic Code Status Info: DNR Allergies Info: Depakote Divalproex Sodium Psychotropic Info: Xanax         Current Medications (11/04/2018):    Discharge Medications:   TAKE these medications   acetaminophen 650 MG CR tablet Commonly known as:  TYLENOL Take 650 mg by mouth every 4 (four) hours as needed for pain (up to 72 hours).   ALPRAZolam 0.25 MG tablet Commonly known as:  XANAX Take 1 tablet (0.25 mg total) by mouth 2 (two) times daily.   Ativan 2 MG/ML injection Generic drug:  LORazepam Inject 2 mg into the muscle as needed for seizure. NOT TO BE USED FOR BEHAVIORS  azithromycin 500 MG tablet Commonly known as:  ZITHROMAX Take 1 tablet (500 mg total) by mouth daily for 6 days.   bimatoprost 0.01 % Soln Commonly known as:  LUMIGAN Place 1 drop into both eyes at bedtime.   cefpodoxime 100 MG tablet Commonly known as:  VANTIN Take 1 tablet (100 mg total) by mouth 2 (two) times daily for 6 days.    cholecalciferol 10 MCG (400 UNIT) Tabs tablet Commonly known as:  VITAMIN D3 Take 400 Units by mouth 2 (two) times daily.   Combigan 0.2-0.5 % ophthalmic solution Generic drug:  brimonidine-timolol Place 1 drop into both eyes every 12 (twelve) hours.   finasteride 5 MG tablet Commonly known as:  PROSCAR Take 5 mg by mouth daily.   flecainide 50 MG tablet Commonly known as:  TAMBOCOR Take 50 mg by mouth 2 (two) times daily.   fluocinonide ointment 0.05 % Commonly known as:  LIDEX Apply 1 application topically 2 (two) times daily as needed (on rash /skin folds, not for face).   lacosamide 200 MG Tabs tablet Commonly known as:  VIMPAT Take 1 tablet (200 mg total) by mouth 2 (two) times daily.   latanoprost 0.005 % ophthalmic solution Commonly known as:  XALATAN Place 1 drop into both eyes at bedtime.   levETIRAcetam 750 MG tablet Commonly known as:  KEPPRA Take 2 tablets (1,500 mg total) by mouth 2 (two) times daily.   phenytoin 100 MG ER capsule Commonly known as:  DILANTIN Take 2 capsules (200 mg total) by mouth 2 (two) times daily.   phenytoin 30 MG ER capsule Commonly known as:  Dilantin Take 1 capsule (30 mg total) by mouth every other day.   polyethylene glycol 17 g packet Commonly known as:  MIRALAX / GLYCOLAX Take 17 g by mouth every other day.   sodium chloride 1 g tablet Take 1 g by mouth daily.   triamcinolone cream 0.1 % Commonly known as:  KENALOG Apply 1 application topically 2 (two) times daily as needed (for flares).   warfarin 2.5 MG tablet Commonly known as:  COUMADIN Take 1 tablet (2.5 mg total) by mouth daily at 12 noon. What changed:    when to take this  additional instructions     Relevant Imaging Results:  Relevant Lab Results:   Additional Information SSN: [id number]   COVID tested negative on 11/02/18  Benard Halsted, LCSW

## 2018-11-05 ENCOUNTER — Telehealth: Payer: Self-pay | Admitting: *Deleted

## 2018-11-05 DIAGNOSIS — Z20828 Contact with and (suspected) exposure to other viral communicable diseases: Secondary | ICD-10-CM | POA: Diagnosis not present

## 2018-11-05 NOTE — Telephone Encounter (Signed)
Transition Care Management Follow-up Telephone Call  Date of discharge and from where:11/04/18 [place]         How have you been since you were released from the hospital? Wife stated he is much better, Sodium level had dropped  Any questions or concerns? No   Items Reviewed:  Did the pt receive and understand the discharge instructions provided? Yes   Medications obtained and verified? Yes  Spoke with wife at home. WS handles medications  Any new allergies since your discharge? No   Dietary orders reviewed? Yes  Do you have support at home? Yes  WS resident and wife  Other (ie: DME, Home Health, etc) Home Health PT  Functional Questionnaire: (I = Independent and D = Dependent) ADL's: I assistance walker  Bathing/Dressing- I   Meal Prep- I [facility]  Eating- I  Maintaining continence- I  Transferring/Ambulation- I with assistance Walker  Managing Meds- I   Follow up appointments reviewed:    PCP Hospital f/u appt confirmed? Yes  Scheduled to see [name] on 11/18/18 @ [facility].  [facility] f/u appt confirmed? No    Are transportation arrangements needed? No   If their condition worsens, is the pt aware to call  their PCP or go to the ED? Yes  Was the patient provided with contact information for the PCP's office or ED? Yes  Was the pt encouraged to call back with questions or concerns? Yes

## 2018-11-05 NOTE — Telephone Encounter (Signed)
I also plan to see him Tuesday next week in his apt because he's having follow up labs on Monday.

## 2018-11-07 LAB — CULTURE, BLOOD (ROUTINE X 2)
Culture: NO GROWTH
Culture: NO GROWTH
Special Requests: ADEQUATE

## 2018-11-10 ENCOUNTER — Non-Acute Institutional Stay: Payer: Medicare Other | Admitting: Internal Medicine

## 2018-11-10 ENCOUNTER — Encounter: Payer: Self-pay | Admitting: Internal Medicine

## 2018-11-10 DIAGNOSIS — R293 Abnormal posture: Secondary | ICD-10-CM

## 2018-11-10 DIAGNOSIS — I4891 Unspecified atrial fibrillation: Secondary | ICD-10-CM | POA: Diagnosis not present

## 2018-11-10 DIAGNOSIS — J181 Lobar pneumonia, unspecified organism: Secondary | ICD-10-CM

## 2018-11-10 DIAGNOSIS — G40919 Epilepsy, unspecified, intractable, without status epilepticus: Secondary | ICD-10-CM | POA: Diagnosis not present

## 2018-11-10 DIAGNOSIS — I951 Orthostatic hypotension: Secondary | ICD-10-CM

## 2018-11-10 DIAGNOSIS — D6869 Other thrombophilia: Secondary | ICD-10-CM | POA: Diagnosis not present

## 2018-11-10 DIAGNOSIS — J189 Pneumonia, unspecified organism: Secondary | ICD-10-CM | POA: Diagnosis not present

## 2018-11-10 DIAGNOSIS — I482 Chronic atrial fibrillation, unspecified: Secondary | ICD-10-CM

## 2018-11-10 DIAGNOSIS — E871 Hypo-osmolality and hyponatremia: Secondary | ICD-10-CM

## 2018-11-10 DIAGNOSIS — D649 Anemia, unspecified: Secondary | ICD-10-CM | POA: Diagnosis not present

## 2018-11-10 DIAGNOSIS — G20 Parkinson's disease: Secondary | ICD-10-CM

## 2018-11-10 DIAGNOSIS — G903 Multi-system degeneration of the autonomic nervous system: Secondary | ICD-10-CM

## 2018-11-10 DIAGNOSIS — G9341 Metabolic encephalopathy: Secondary | ICD-10-CM | POA: Diagnosis not present

## 2018-11-10 NOTE — Progress Notes (Signed)
Patient ID: Cameron Potts, male   DOB: 06/05/1936, 83 y.o.   MRN: [id number]  Provider:  Jonelle Sidle L. Mariea Clonts, D.O., C.M.D. Location:  [place] Room Number: 696  Place of Service:  ALF (13)  PCP: Gayland Curry, DO Patient Care Team: Gayland Curry, DO as PCP - General (Geriatric Medicine) Rutherford Guys, MD as Consulting Physician (Ophthalmology) Kathrynn Ducking, MD as Consulting Physician (Neurology)  Extended Emergency Contact Information Primary Emergency Contact: Longshore,[name] Address: [address] [place] [address] Johnnette Litter of [place] Phone: [phone] Mobile Phone: [phone] Relation: Spouse Secondary Emergency Contact: Hawe,Louise Mobile Phone: [phone] Relation: Son  Code Status: DNR Goals of Care: Advanced Directive information Advanced Directives 11/03/2018  Does Patient Have a Medical Advance Directive? Yes  Type of Paramedic of [place];Living will  Does patient want to make changes to medical advance directive? No - Patient declined  Copy of [place] in Chart? No - copy requested  Would patient like information on creating a medical advance directive? -  Pre-existing out of facility DNR order (yellow form or pink MOST form) -   Chief Complaint  Patient presents with  . Readmit To AL    Readmission to Assisted Living    HPI: Patient is a 83 y.o. male seen today for readmission to [facility] s/p hospitalization from 5/18-5/20/20 with an episode of being obtunded per nursing staff.  He was unresponsive and then became minimally responsive with decorticate posturing and low grade temp.  He was unable to answer questions appropriately.  Of note, he'd not been himself fully since his last seizure that took him to the ED on 4/20 where he had the hemiparesis.  This admission, he was found to have HCAP and hyponatremia.  In f/u, I'd requested his bmp and INR be  drawn early this morning so I'd have the results for today's visit; however, he initially refused, but did agree with encouragement so lab came back and drew them stat.  They were not yet back when I saw him; however.  He reports his cough has dissipated.  He is not having any discolored sputum and has been w/o fever.  He's starting to feel his normal self again.  He is eating and drinking and says this is the first day his appetite has been back.  He seems much clearer today than when I last saw him 5/13.   He had two negative covid tests.  Prior to discharge, his sodium had improved to 134 5/20 from 126 on 5/18.  His dilantin level was 14.7 on 5/20 and wbc 3.5.  INR 5/20 was normal at 2.6 with goal 2-3.    Past Medical History:  Diagnosis Date  . Abnormality of gait 12/21/2013  . Atrial fibrillation ([place])   . Blind left eye   . Cardiac arrest ([place])   . Glaucoma   . Hypercholesteremia   . Hyperlipidemia   . Mild left ventricular systolic dysfunction   . PSA elevation   . Right frontal lobe lesion   . Seizures ([place])   . Urinary urgency    Past Surgical History:  Procedure Laterality Date  . CARDIAC DEFIBRILLATOR PLACEMENT    . cataract surgery    . COLONOSCOPY    . CRANIOTOMY Right    right anterior temporal resection  . defibrillator replaced  March 2016  . IMPLANTATION VAGAL NERVE STIMULATOR  June 2016  . NASAL SINUS SURGERY    . TONSILLECTOMY     age 10  . VASECTOMY  1985    reports that he has quit smoking. His smoking use included cigarettes. He has a 7.50 pack-year smoking history. He has never used smokeless tobacco. He reports that he does not drink alcohol or use drugs. Social History   Socioeconomic History  . Marital status: Married    Spouse name: Not on file  . Number of children: 3  . Years of education: MBA  . Highest education level: Not on file  Occupational History  . Occupation: Retired  . Occupation: Physicist, medical Needs  . Financial  resource strain: Not hard at all  . Food insecurity:    Worry: Never true    Inability: Never true  . Transportation needs:    Medical: No    Non-medical: No  Tobacco Use  . Smoking status: Former Smoker    Packs/day: 0.75    Years: 10.00    Pack years: 7.50    Types: Cigarettes  . Smokeless tobacco: Never Used  . Tobacco comment: quit 1960s  Substance and Sexual Activity  . Alcohol use: No    Comment: former beer drinker  . Drug use: No  . Sexual activity: Not Currently  Lifestyle  . Physical activity:    Days per week: 5 days    Minutes per session: 30 min  . Stress: Not at all  Relationships  . Social connections:    Talks on phone: Three times a week    Gets together: Three times a week    Attends religious service: Never    Active member of club or organization: No    Attends meetings of clubs or organizations: Never    Relationship status: Married  . Intimate partner violence:    Fear of current or ex partner: No    Emotionally abused: No    Physically abused: No    Forced sexual activity: No  Other Topics Concern  . Not on file  Social History Narrative   Patient drinks 1-2 cups of caffeine daily.   Patient is right handed.      Tobacco use, amount per day DJS:HFWY   Past tobacco use, amount per day:10   How many years did you use tobacco:20   Alcohol use (drinks per week): NONE   Diet:REGULAR   Do you drink/eat things with caffeine:COFFEE, PEPSI   Marital status: MARRIED                                 What year were you married? 1964   Do you live in a house, apartment, assisted living, condo, trailer, etc.? [place]   Is it one or more stories?   How many persons live in your home?   Do you have pets in your home?( please list) NO   Current or past profession: [employer]   Do you exercise?  YES                                Type and how often? 5 DAYS A WEEK FOR 30 MINUTES   Do you have a living will?YES   Do you have a DNR  form?  If not, do you want to discuss one?   Do you have signed POA/HPOA forms?                        If so, please bring to you appointment          Functional Status Survey:    Family History  Problem Relation Age of Onset  . Cancer - Lung Mother   . Cancer - Prostate Father     Health Maintenance  Topic Date Due  . INFLUENZA VACCINE  01/16/2019  . TETANUS/TDAP  12/06/2025  . PNA vac Low Risk Adult  Completed    Allergies  Allergen Reactions  . Depakote [Divalproex Sodium] Other (See Comments)    Arthralgias     Outpatient Encounter Medications as of 11/10/2018  Medication Sig  . acetaminophen (TYLENOL) 650 MG CR tablet Take 650 mg by mouth every 4 (four) hours as needed for pain (up to 72 hours).  . ALPRAZolam (XANAX) 0.25 MG tablet Take 1 tablet (0.25 mg total) by mouth 2 (two) times daily.  . [EXPIRED] azithromycin (ZITHROMAX) 500 MG tablet Take 1 tablet (500 mg total) by mouth daily for 6 days.  . brimonidine-timolol (COMBIGAN) 0.2-0.5 % ophthalmic solution Place 1 drop into both eyes every 12 (twelve) hours.   . [EXPIRED] cefpodoxime (VANTIN) 100 MG tablet Take 1 tablet (100 mg total) by mouth 2 (two) times daily for 6 days.  . cholecalciferol (VITAMIN D) 400 units TABS tablet Take 400 Units by mouth 2 (two) times daily.  . finasteride (PROSCAR) 5 MG tablet Take 5 mg by mouth daily.   . flecainide (TAMBOCOR) 50 MG tablet Take 50 mg by mouth 2 (two) times daily.  . fluocinonide ointment (LIDEX) 6.44 % Apply 1 application topically 2 (two) times daily as needed (on rash /skin folds, not for face).   Marland Kitchen lacosamide (VIMPAT) 200 MG TABS tablet Take 1 tablet (200 mg total) by mouth 2 (two) times daily.  Marland Kitchen latanoprost (XALATAN) 0.005 % ophthalmic solution Place 1 drop into both eyes at bedtime.  . levETIRAcetam (KEPPRA) 750 MG tablet Take 2 tablets (1,500 mg total) by mouth 2 (two) times daily.  Marland Kitchen LORazepam (ATIVAN) 2 MG/ML injection Inject 2  mg into the muscle as needed for seizure. NOT TO BE USED FOR BEHAVIORS  . phenytoin (DILANTIN) 100 MG ER capsule Take 2 capsules (200 mg total) by mouth 2 (two) times daily.  . phenytoin (DILANTIN) 30 MG ER capsule Take 1 capsule (30 mg total) by mouth every other day.  . polyethylene glycol (MIRALAX / GLYCOLAX) packet Take 17 g by mouth every other day.   . sodium chloride 1 g tablet Take 1 g by mouth daily.  Marland Kitchen triamcinolone cream (KENALOG) 0.1 % Apply 1 application topically 2 (two) times daily as needed (for flares).   . warfarin (COUMADIN) 2.5 MG tablet Take 1 tablet (2.5 mg total) by mouth daily at 12 noon.  . [DISCONTINUED] bimatoprost (LUMIGAN) 0.01 % SOLN Place 1 drop into both eyes at bedtime.    No facility-administered encounter medications on file as of 11/10/2018.     Review of Systems  Constitutional: Positive for appetite change and fatigue. Negative for activity change, chills and fever.  HENT: Negative for congestion and trouble swallowing.   Eyes: Negative for visual disturbance.  Respiratory: Positive for cough. Negative for chest tightness and shortness of breath.   Cardiovascular: Negative for chest pain, palpitations and leg swelling.  Gastrointestinal: Negative for abdominal pain, constipation, diarrhea, nausea and vomiting.  Genitourinary: Negative for dysuria.  Musculoskeletal: Positive for gait problem. Negative for joint swelling.  Skin: Negative for color change.  Neurological: Negative for dizziness and weakness.  Hematological: Bruises/bleeds easily.  Psychiatric/Behavioral: Positive for confusion. Negative for agitation, behavioral problems and sleep disturbance.       Gets easily agitated with certain staff     Vitals:   11/10/18 1123  BP: 130/72  Pulse: 70  Resp: 18  Temp: (!) 97.5 F (36.4 C)  TempSrc: Oral  SpO2: 95%  Weight: 166 lb (75.3 kg)  Height: 5\' 8"  (1.727 m)   Body mass index is 25.24 kg/m. Physical Exam Vitals signs reviewed.    Constitutional:      General: He is not in acute distress.    Appearance: Normal appearance. He is not ill-appearing, toxic-appearing or diaphoretic.  HENT:     Head: Normocephalic.     Right Ear: External ear normal.     Left Ear: External ear normal.     Nose:     Comments: Nose and mouth deferred with covid masking Eyes:     Extraocular Movements: Extraocular movements intact.     Conjunctiva/sclera: Conjunctivae normal.     Pupils: Pupils are equal, round, and reactive to light.  Cardiovascular:     Rate and Rhythm: Rhythm irregular.     Pulses: Normal pulses.     Heart sounds: Normal heart sounds.  Pulmonary:     Effort: Pulmonary effort is normal.     Breath sounds: Normal breath sounds. No wheezing, rhonchi or rales.  Abdominal:     General: Bowel sounds are normal.  Musculoskeletal: Normal range of motion.     Right lower leg: No edema.     Left lower leg: No edema.  Skin:    General: Skin is warm and dry.     Capillary Refill: Capillary refill takes less than 2 seconds.     Comments: Some bruising at site of phlebotomy from hospital  Neurological:     Mental Status: He is alert. Mental status is at baseline.  Psychiatric:        Mood and Affect: Mood normal.     Comments: Quite talkative and social today    Labs reviewed: Basic Metabolic Panel: Recent Labs    11/02/18 1406 11/03/18 0338 11/04/18 0247  NA 126* 125* 134*  K 4.0 3.8 4.2  CL 93* 96* 102  CO2 21* 20* 24  GLUCOSE 108* 92 99  BUN 11 9 7*  CREATININE 0.79 0.70 0.68  CALCIUM 8.5* 8.2* 8.4*   Liver Function Tests: Recent Labs    03/04/18 1311 03/27/18 1154 10/05/18 2016  AST 29  --  29  ALT 25  --  23  ALKPHOS 128*  --  171*  BILITOT 0.2  --  0.3  PROT 7.3  --  7.9  ALBUMIN 4.6 3.8 4.6   No results for input(s): LIPASE, AMYLASE in the last 8760 hours. No results for input(s): AMMONIA in the last 8760 hours. CBC: Recent Labs    10/05/18 2016 11/02/18 1406 11/03/18 0338  11/04/18 0247  WBC 6.1 2.2* 3.6* 3.5*  NEUTROABS 3.3 0.7* 0.9*  --   HGB 14.2 10.9* 11.6* 10.9*  HCT 39.5 30.6* 31.1* 29.4*  MCV 94.5 88.4 87.4 88.8  PLT 227 167 192 190   Cardiac Enzymes: Recent Labs    11/02/18 1406 11/02/18 2113  CKTOTAL  --  330  TROPONINI <0.03  --    BNP: Invalid input(s): POCBNP No results found for: HGBA1C No results found for: TSH No results found for: VITAMINB12 No results found for: FOLATE No results found for: IRON, TIBC, FERRITIN  Imaging and Procedures obtained prior to SNF admission: Dg Chest 2 View  Result Date: 11/03/2018 CLINICAL DATA:  Fever. EXAM: CHEST - 2 VIEW COMPARISON:  11/02/2018 FINDINGS: ICD and vagal nerve stimulator remain in place. The cardiomediastinal silhouette is unchanged with normal heart size. Aortic atherosclerosis is noted. The lungs are well inflated. There is focal posterior basilar opacity on the lateral radiograph which may be in the left lower lobe. The lungs are otherwise clear. No pleural effusion or pneumothorax is identified. No acute osseous abnormality is seen. IMPRESSION: Basilar left lower lobe opacity which may reflect pneumonia. Electronically Signed   By: Logan Bores M.D.   On: 11/03/2018 11:37   Ct Head Wo Contrast  Result Date: 11/02/2018 CLINICAL DATA:  83 year old nursing home patient with progressively worsening confusion over the past 3 days, associated with fever. EXAM: CT HEAD WITHOUT CONTRAST TECHNIQUE: Contiguous axial images were obtained from the base of the skull through the vertex without intravenous contrast. COMPARISON:  10/05/2018 and earlier. FINDINGS: Brain: Patient motion blurred images of the skull base. Moderate cortical and deep atrophy, unchanged dating back to 2019. Mild changes of small vessel disease of the white matter, with a more focal white matter stroke involving the RIGHT POSTERIOR frontal lobe, unchanged. Encephalomalacia involving the RIGHT temporal lobe, unchanged. No mass  lesion. No midline shift. No acute hemorrhage or hematoma. No extra-axial fluid collections. No evidence of acute infarction. Vascular: Severe BILATERAL carotid siphon and BILATERAL vertebral artery atherosclerosis. No hyperdense vessel. Skull: Prior RIGHT temporal craniotomy. No skull fracture or focal osseous abnormality elsewhere involving the skull. Sinuses/Orbits: Mucosal thickening involving the maxillary sinuses. Apparent prior BILATERAL maxillary sinus MEDIAL antrostomies. Opacification of ANTERIOR ethmoid air cells bilaterally. Mild mucosal thickening involving the RIGHT frontal sinus. Sphenoid sinuses well aerated. BILATERAL mastoid air cells and BILATERAL middle ear cavities well-aerated. Benign senile calcifications involving both eyes. Other: None. IMPRESSION: 1. No acute intracranial abnormality. 2. Stable moderate generalized atrophy and mild chronic microvascular ischemic changes of the white matter. 3. Stable encephalomalacia involving the right temporal lobe. 4. Mild chronic bilateral maxillary, bilateral ethmoid and right frontal sinus disease. Electronically Signed   By: Evangeline Dakin M.D.   On: 11/02/2018 16:41   Dg Chest Portable 1 View  Result Date: 11/02/2018 CLINICAL DATA:  Altered mental status. EXAM: PORTABLE CHEST 1 VIEW COMPARISON:  None. FINDINGS: The heart size and mediastinal contours are within normal limits. Both lungs are clear. Left-sided pacemaker is noted. No pneumothorax or pleural effusion is noted. The visualized skeletal structures are unremarkable. IMPRESSION: No active disease. Electronically Signed   By: Marijo Conception M.D.   On: 11/02/2018 13:50    Assessment/Plan 1. Decorticate posture -was noted during unresponsive episode (?seizure or syncope) prior to admission for pneumonia and hyponatremia  2. Pneumonia of left lower lobe due to infectious organism [facility]) -clinically resolved -finishes abx today--zithromax and cefpodoxime (had 6 more days at  d/c)  3. Hyponatremia -improved, f/u bmp pending today -maintained on sodium chloride tablets so low level must have been related to his pneumonia  4. Hypercoagulability due to atrial fibrillation (HCC) -goal INR 2-3; INR pending when I finished visit  5. Chronic atrial fibrillation -rate controlled; on flecainide antiarrhythmic; is managed with coumadin presumably due to reversibility  in view of risk of head injury with his epilepsy and orthostatic hypotension -goal INR 2-3 -requires very close monitoring due to seizure med adjustments and metabolism  6. Refractory epilepsy ([place]) -continue use of vagal stimulator if seizure occurs; located on right chest (has ICD on left)--pt keeps device with him at all times -also has his chronic meds:  Keppra 1500 bid, prn ativan in case of seizure, dilantin 200mg  bid with an additional 30mg  every other day, vimpat 200mg  po bid -was taken off zonegran  7. Parkinsonian syndrome associated with symptomatic orthostatic hypotension (HCC) -was suspected to have multisystem atrophy by prior neurologist before his move here -sees Dr. Jannifer Franklin about his seizures here  Family/ staff Communication:   Discussed with AL nursing  Labs/tests ordered:  Bmp, INR pending  Mckinley Adelstein L. Jeree Delcid, D.O. [facility] [address], [place] [address] Cell Phone (Mon-Fri 8am-5pm):  [phone] On Call:  [phone] & follow prompts after 5pm & weekends Office Phone:  [phone] Office Fax:  [phone]

## 2018-11-13 DIAGNOSIS — R2689 Other abnormalities of gait and mobility: Secondary | ICD-10-CM | POA: Diagnosis not present

## 2018-11-13 DIAGNOSIS — R278 Other lack of coordination: Secondary | ICD-10-CM | POA: Diagnosis not present

## 2018-11-13 DIAGNOSIS — F4322 Adjustment disorder with anxiety: Secondary | ICD-10-CM | POA: Diagnosis not present

## 2018-11-13 DIAGNOSIS — E871 Hypo-osmolality and hyponatremia: Secondary | ICD-10-CM | POA: Diagnosis not present

## 2018-11-16 DIAGNOSIS — F4322 Adjustment disorder with anxiety: Secondary | ICD-10-CM | POA: Diagnosis not present

## 2018-11-16 DIAGNOSIS — R278 Other lack of coordination: Secondary | ICD-10-CM | POA: Diagnosis not present

## 2018-11-16 DIAGNOSIS — E871 Hypo-osmolality and hyponatremia: Secondary | ICD-10-CM | POA: Diagnosis not present

## 2018-11-16 DIAGNOSIS — R2689 Other abnormalities of gait and mobility: Secondary | ICD-10-CM | POA: Diagnosis not present

## 2018-11-18 ENCOUNTER — Other Ambulatory Visit: Payer: Self-pay

## 2018-11-18 ENCOUNTER — Non-Acute Institutional Stay: Payer: Medicare Other | Admitting: Internal Medicine

## 2018-11-18 ENCOUNTER — Encounter: Payer: Self-pay | Admitting: Internal Medicine

## 2018-11-18 VITALS — BP 132/78 | HR 77 | Temp 97.8°F | Ht 68.0 in | Wt 167.0 lb

## 2018-11-18 DIAGNOSIS — B353 Tinea pedis: Secondary | ICD-10-CM | POA: Diagnosis not present

## 2018-11-18 DIAGNOSIS — R2689 Other abnormalities of gait and mobility: Secondary | ICD-10-CM | POA: Diagnosis not present

## 2018-11-18 DIAGNOSIS — G40919 Epilepsy, unspecified, intractable, without status epilepticus: Secondary | ICD-10-CM

## 2018-11-18 DIAGNOSIS — L219 Seborrheic dermatitis, unspecified: Secondary | ICD-10-CM

## 2018-11-18 DIAGNOSIS — I951 Orthostatic hypotension: Secondary | ICD-10-CM

## 2018-11-18 DIAGNOSIS — G20 Parkinson's disease: Secondary | ICD-10-CM

## 2018-11-18 DIAGNOSIS — G903 Multi-system degeneration of the autonomic nervous system: Secondary | ICD-10-CM | POA: Diagnosis not present

## 2018-11-18 DIAGNOSIS — L111 Transient acantholytic dermatosis [Grover]: Secondary | ICD-10-CM | POA: Diagnosis not present

## 2018-11-18 DIAGNOSIS — R278 Other lack of coordination: Secondary | ICD-10-CM | POA: Diagnosis not present

## 2018-11-18 DIAGNOSIS — E871 Hypo-osmolality and hyponatremia: Secondary | ICD-10-CM | POA: Diagnosis not present

## 2018-11-18 DIAGNOSIS — F4322 Adjustment disorder with anxiety: Secondary | ICD-10-CM | POA: Diagnosis not present

## 2018-11-18 NOTE — Progress Notes (Signed)
Location:  Occupational psychologist of Service:  Clinic (12)  Provider: Azie Mcconahy L. Mariea Clonts, D.O., C.M.D.  Code Status: DNR Goals of Care:  Advanced Directives 11/03/2018  Does Patient Have a Medical Advance Directive? Yes  Type of Paramedic of [place];Living will  Does patient want to make changes to medical advance directive? No - Patient declined  Copy of [place] in Chart? No - copy requested  Would patient like information on creating a medical advance directive? -  Pre-existing out of facility DNR order (yellow form or pink MOST form) -     Chief Complaint  Patient presents with  . Medical Management of Chronic Issues    follow-up    HPI: Patient is a 83 y.o. male seen today for medical management of chronic diseases.  He was seen last week in his apt for [facility] visit after hospitalization for hyponatremia and pneumonia both of which had resolved.    He uses a cream daily--uses cerave.  Uses clobetasol on itchy dry patches on his back, in his pubic area and on his buttocks.  They itch.  The left side of his head has started to itch some also.  He's never had a shampoo or lotion for his scalp.    He has PT with Rob.  His new rollator is working much better.  He is meeting with him three times a week.    He's had all of these things ongoing.  He uses sprays on his feet for dry itchy skin there.  Has the fungus which is controlled with the spray for athlete's foot.  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   He continues to carry his button to swipe on his vagal stimulator if he has a seizure.  He also continues with some orthostatic hypotension.  Has to move slowly, uses his rollator, and hydrate well.    Past Medical History:  Diagnosis Date  . Abnormality of gait 12/21/2013  . Atrial fibrillation ([place])   . Blind left eye   . Cardiac arrest ([place])   . Glaucoma   . Hypercholesteremia   .  Hyperlipidemia   . Mild left ventricular systolic dysfunction   . PSA elevation   . Right frontal lobe lesion   . Seizures ([place])   . Urinary urgency     Past Surgical History:  Procedure Laterality Date  . CARDIAC DEFIBRILLATOR PLACEMENT    . cataract surgery    . COLONOSCOPY    . CRANIOTOMY Right    right anterior temporal resection  . defibrillator replaced  March 2016  . IMPLANTATION VAGAL NERVE STIMULATOR  June 2016  . NASAL SINUS SURGERY    . TONSILLECTOMY     age 32  . VASECTOMY  1985    Allergies  Allergen Reactions  . Depakote [Divalproex Sodium] Other (See Comments)    Arthralgias     Outpatient Encounter Medications as of 11/18/2018  Medication Sig  . acetaminophen (TYLENOL) 650 MG CR tablet Take 650 mg by mouth every 4 (four) hours as needed for pain (up to 72 hours).  . ALPRAZolam (XANAX) 0.25 MG tablet Take 1 tablet (0.25 mg total) by mouth 2 (two) times daily.  . brimonidine-timolol (COMBIGAN) 0.2-0.5 % ophthalmic solution Place 1 drop into both eyes every 12 (twelve) hours.   . cholecalciferol (VITAMIN D) 400 units TABS tablet Take 400 Units by mouth 2 (two)  times daily.  . finasteride (PROSCAR) 5 MG tablet Take 5 mg by mouth daily.   . flecainide (TAMBOCOR) 50 MG tablet Take 50 mg by mouth 2 (two) times daily.  . fluocinonide ointment (LIDEX) 7.67 % Apply 1 application topically 2 (two) times daily as needed (on rash /skin folds, not for face).   Marland Kitchen lacosamide (VIMPAT) 200 MG TABS tablet Take 1 tablet (200 mg total) by mouth 2 (two) times daily.  Marland Kitchen latanoprost (XALATAN) 0.005 % ophthalmic solution Place 1 drop into both eyes at bedtime.  . levETIRAcetam (KEPPRA) 750 MG tablet Take 2 tablets (1,500 mg total) by mouth 2 (two) times daily.  Marland Kitchen LORazepam (ATIVAN) 2 MG/ML injection Inject 2 mg into the muscle as needed for seizure. NOT TO BE USED FOR BEHAVIORS  . phenytoin (DILANTIN) 100 MG ER capsule Take 2 capsules (200 mg total) by mouth 2 (two) times daily.  .  phenytoin (DILANTIN) 30 MG ER capsule Take 1 capsule (30 mg total) by mouth every other day.  . polyethylene glycol (MIRALAX / GLYCOLAX) packet Take 17 g by mouth every other day.   . sodium chloride 1 g tablet Take 1 g by mouth daily.  Marland Kitchen triamcinolone cream (KENALOG) 0.1 % Apply 1 application topically 2 (two) times daily as needed (for flares).   . warfarin (COUMADIN) 2.5 MG tablet Take 1 tablet (2.5 mg total) by mouth daily at 12 noon.   No facility-administered encounter medications on file as of 11/18/2018.     Review of Systems:  Review of Systems  Constitutional: Negative for chills, fever and malaise/fatigue.  HENT: Positive for hearing loss.   Eyes: Negative for blurred vision.  Respiratory: Negative for cough and shortness of breath.   Cardiovascular: Negative for chest pain, palpitations and leg swelling.  Gastrointestinal: Negative for abdominal pain, blood in stool, constipation, diarrhea and melena.  Genitourinary: Negative for dysuria.  Musculoskeletal: Positive for falls. Negative for joint pain.  Skin: Positive for itching and rash.  Neurological: Positive for dizziness and seizures. Negative for loss of consciousness.  Endo/Heme/Allergies: Bruises/bleeds easily.  Psychiatric/Behavioral: Positive for memory loss. Negative for depression. The patient is not nervous/anxious and does not have insomnia.     Health Maintenance  Topic Date Due  . INFLUENZA VACCINE  01/16/2019  . TETANUS/TDAP  12/06/2025  . PNA vac Low Risk Adult  Completed    Physical Exam: Vitals:   11/18/18 0916  BP: 132/78  Pulse: 77  Temp: 97.8 F (36.6 C)  TempSrc: Oral  SpO2: 99%  Weight: 167 lb (75.8 kg)  Height: 5\' 8"  (1.727 m)   Body mass index is 25.39 kg/m. Physical Exam Vitals signs and nursing note reviewed.  Constitutional:      Appearance: Normal appearance.  HENT:     Head: Normocephalic and atraumatic.     Ears:     Comments: Hearing aids Cardiovascular:     Rate and  Rhythm: Normal rate and regular rhythm.  Pulmonary:     Effort: Pulmonary effort is normal.     Breath sounds: Normal breath sounds.  Abdominal:     General: Bowel sounds are normal.  Musculoskeletal: Normal range of motion.     Right lower leg: No edema.     Left lower leg: No edema.     Comments: Using rollator walker  Skin:    Comments: Dry scaly skin on right side of scalp; also has dry scaly skin on the feet and legs as well as his back,  groin/pubic area  Neurological:     General: No focal deficit present.     Mental Status: He is alert and oriented to person, place, and time.     Gait: Gait abnormal.  Psychiatric:        Mood and Affect: Mood normal.        Behavior: Behavior normal.        Thought Content: Thought content normal.        Judgment: Judgment normal.     Labs reviewed: Basic Metabolic Panel: Recent Labs    11/02/18 1406 11/03/18 0338 11/04/18 0247  NA 126* 125* 134*  K 4.0 3.8 4.2  CL 93* 96* 102  CO2 21* 20* 24  GLUCOSE 108* 92 99  BUN 11 9 7*  CREATININE 0.79 0.70 0.68  CALCIUM 8.5* 8.2* 8.4*   Liver Function Tests: Recent Labs    03/04/18 1311 03/27/18 1154 10/05/18 2016  AST 29  --  29  ALT 25  --  23  ALKPHOS 128*  --  171*  BILITOT 0.2  --  0.3  PROT 7.3  --  7.9  ALBUMIN 4.6 3.8 4.6   No results for input(s): LIPASE, AMYLASE in the last 8760 hours. No results for input(s): AMMONIA in the last 8760 hours. CBC: Recent Labs    10/05/18 2016 11/02/18 1406 11/03/18 0338 11/04/18 0247  WBC 6.1 2.2* 3.6* 3.5*  NEUTROABS 3.3 0.7* 0.9*  --   HGB 14.2 10.9* 11.6* 10.9*  HCT 39.5 30.6* 31.1* 29.4*  MCV 94.5 88.4 87.4 88.8  PLT 227 167 192 190   Lipid Panel: No results for input(s): CHOL, HDL, LDLCALC, TRIG, CHOLHDL, LDLDIRECT in the last 8760 hours. No results found for: HGBA1C  Procedures since last visit: Dg Chest 2 View  Result Date: 11/03/2018 CLINICAL DATA:  Fever. EXAM: CHEST - 2 VIEW COMPARISON:  11/02/2018 FINDINGS:  ICD and vagal nerve stimulator remain in place. The cardiomediastinal silhouette is unchanged with normal heart size. Aortic atherosclerosis is noted. The lungs are well inflated. There is focal posterior basilar opacity on the lateral radiograph which may be in the left lower lobe. The lungs are otherwise clear. No pleural effusion or pneumothorax is identified. No acute osseous abnormality is seen. IMPRESSION: Basilar left lower lobe opacity which may reflect pneumonia. Electronically Signed   By: Logan Bores M.D.   On: 11/03/2018 11:37   Ct Head Wo Contrast  Result Date: 11/02/2018 CLINICAL DATA:  83 year old nursing home patient with progressively worsening confusion over the past 3 days, associated with fever. EXAM: CT HEAD WITHOUT CONTRAST TECHNIQUE: Contiguous axial images were obtained from the base of the skull through the vertex without intravenous contrast. COMPARISON:  10/05/2018 and earlier. FINDINGS: Brain: Patient motion blurred images of the skull base. Moderate cortical and deep atrophy, unchanged dating back to 2019. Mild changes of small vessel disease of the white matter, with a more focal white matter stroke involving the RIGHT POSTERIOR frontal lobe, unchanged. Encephalomalacia involving the RIGHT temporal lobe, unchanged. No mass lesion. No midline shift. No acute hemorrhage or hematoma. No extra-axial fluid collections. No evidence of acute infarction. Vascular: Severe BILATERAL carotid siphon and BILATERAL vertebral artery atherosclerosis. No hyperdense vessel. Skull: Prior RIGHT temporal craniotomy. No skull fracture or focal osseous abnormality elsewhere involving the skull. Sinuses/Orbits: Mucosal thickening involving the maxillary sinuses. Apparent prior BILATERAL maxillary sinus MEDIAL antrostomies. Opacification of ANTERIOR ethmoid air cells bilaterally. Mild mucosal thickening involving the RIGHT frontal sinus. Sphenoid sinuses well aerated.  BILATERAL mastoid air cells and  BILATERAL middle ear cavities well-aerated. Benign senile calcifications involving both eyes. Other: None. IMPRESSION: 1. No acute intracranial abnormality. 2. Stable moderate generalized atrophy and mild chronic microvascular ischemic changes of the white matter. 3. Stable encephalomalacia involving the right temporal lobe. 4. Mild chronic bilateral maxillary, bilateral ethmoid and right frontal sinus disease. Electronically Signed   By: Evangeline Dakin M.D.   On: 11/02/2018 16:41   Dg Chest Portable 1 View  Result Date: 11/02/2018 CLINICAL DATA:  Altered mental status. EXAM: PORTABLE CHEST 1 VIEW COMPARISON:  None. FINDINGS: The heart size and mediastinal contours are within normal limits. Both lungs are clear. Left-sided pacemaker is noted. No pneumothorax or pleural effusion is noted. The visualized skeletal structures are unremarkable. IMPRESSION: No active disease. Electronically Signed   By: Marijo Conception M.D.   On: 11/02/2018 13:50    Assessment/Plan 1. Grover's disease -cont current topicals that have been effective  2. Seborrheic dermatitis of scalp -nizoral shampoo twice weekly  3. Refractory epilepsy ([place]) -cont his extensive regimen and monitoring and f/u with Dr. Jannifer Franklin as planned  4. Parkinsonian syndrome associated with symptomatic orthostatic hypotension (HCC) -ongoing issue, continue current approach and PT  5. Tinea pedis of both feet -cont otc spray which has been effective  Labs/tests ordered:  No new; has dilantin and INR ordered in a couple of weeks (does not like separate labs and requested all be consolidated)  Next appt:  12/30/2018  Cordero Surette L. Chanci Ojala, D.O. [facility] [address], [place] [address] Cell Phone (Mon-Fri 8am-5pm):  [phone] On Call:  [phone] & follow prompts after 5pm & weekends Office Phone:  [phone] Office Fax:  [phone]

## 2018-11-20 DIAGNOSIS — F4322 Adjustment disorder with anxiety: Secondary | ICD-10-CM | POA: Diagnosis not present

## 2018-11-20 DIAGNOSIS — R2689 Other abnormalities of gait and mobility: Secondary | ICD-10-CM | POA: Diagnosis not present

## 2018-11-20 DIAGNOSIS — R278 Other lack of coordination: Secondary | ICD-10-CM | POA: Diagnosis not present

## 2018-11-20 DIAGNOSIS — E871 Hypo-osmolality and hyponatremia: Secondary | ICD-10-CM | POA: Diagnosis not present

## 2018-11-23 ENCOUNTER — Telehealth: Payer: Self-pay | Admitting: Neurology

## 2018-11-23 DIAGNOSIS — E871 Hypo-osmolality and hyponatremia: Secondary | ICD-10-CM | POA: Diagnosis not present

## 2018-11-23 DIAGNOSIS — R278 Other lack of coordination: Secondary | ICD-10-CM | POA: Diagnosis not present

## 2018-11-23 DIAGNOSIS — Z5181 Encounter for therapeutic drug level monitoring: Secondary | ICD-10-CM

## 2018-11-23 DIAGNOSIS — F4322 Adjustment disorder with anxiety: Secondary | ICD-10-CM | POA: Diagnosis not present

## 2018-11-23 DIAGNOSIS — R2689 Other abnormalities of gait and mobility: Secondary | ICD-10-CM | POA: Diagnosis not present

## 2018-11-23 NOTE — Telephone Encounter (Signed)
I called the wife.  The patient apparently for years has had some drowsiness after he takes his morning medications, he is on 3 different seizure medications.  There has been some question of Parkinson's disease versus multisystems atrophy from the prior neurologist.  The patient is continuing to worsen with his ability to ambulate, I will get him into the next couple months for a revisit to evaluate the walking.  We will check blood work for his drug levels.  The patient resides at [facility], the number there is [phone].  The fax number is [phone].

## 2018-11-23 NOTE — Telephone Encounter (Signed)
Pt states he has a lot of fatigue in the morning for several hours and is wondering if the dosages of his anti seizure medications should be adjusted. Please advise.

## 2018-11-23 NOTE — Telephone Encounter (Signed)
I called the patient.  I left a message, I will call back later.  We may need to check blood levels of his Keppra, Vimpat and Dilantin.

## 2018-11-24 ENCOUNTER — Encounter: Payer: Self-pay | Admitting: Internal Medicine

## 2018-11-24 DIAGNOSIS — G4089 Other seizures: Secondary | ICD-10-CM | POA: Diagnosis not present

## 2018-11-24 DIAGNOSIS — D649 Anemia, unspecified: Secondary | ICD-10-CM | POA: Diagnosis not present

## 2018-11-24 DIAGNOSIS — R569 Unspecified convulsions: Secondary | ICD-10-CM | POA: Diagnosis not present

## 2018-11-24 DIAGNOSIS — I4891 Unspecified atrial fibrillation: Secondary | ICD-10-CM | POA: Diagnosis not present

## 2018-11-24 LAB — CBC AND DIFFERENTIAL
HCT: 34 — AB (ref 41–53)
Hemoglobin: 12 — AB (ref 13.5–17.5)
Platelets: 245 (ref 150–399)
WBC: 4.1

## 2018-11-24 LAB — PROTIME-INR: Protime: 25.5 — AB (ref 10.0–13.8)

## 2018-11-24 LAB — BASIC METABOLIC PANEL
BUN: 12 (ref 4–21)
Creatinine: 0.8 (ref 0.6–1.3)
Glucose: 87
Potassium: 4.9 (ref 3.4–5.3)
Sodium: 138 (ref 137–147)

## 2018-11-24 LAB — POCT INR: INR: 2.4 — AB (ref 0.9–1.1)

## 2018-11-24 LAB — HEPATIC FUNCTION PANEL
ALT: 24 (ref 10–40)
AST: 26 (ref 14–40)
Alkaline Phosphatase: 125 (ref 25–125)
Bilirubin, Total: 0.3

## 2018-11-24 NOTE — Telephone Encounter (Signed)
Pt has been scheduled for 2 month f/u on 01/20/19 at 11 am check in of 10:30. Pt's wife and [facility] at [facility] notified of appt time.

## 2018-11-25 ENCOUNTER — Telehealth: Payer: Self-pay | Admitting: Neurology

## 2018-11-25 ENCOUNTER — Encounter: Payer: Self-pay | Admitting: Internal Medicine

## 2018-11-25 DIAGNOSIS — Z20828 Contact with and (suspected) exposure to other viral communicable diseases: Secondary | ICD-10-CM | POA: Diagnosis not present

## 2018-11-25 MED ORDER — LEVETIRACETAM ER 750 MG PO TB24: 3000.0000 mg | ORAL_TABLET | Freq: Every day | ORAL | 3 refills | Status: DC

## 2018-11-25 NOTE — Telephone Encounter (Signed)
I called the wife.  The blood work that was done recently shows an INR of 2.4, phenytoin level was 17.3, Keppra level was elevated at 73 with a reference range of 4.0-46.0.  A chemistry profile was done showing a glucose of 87, calcium 8.8, BUN of 12.1, creatinine of 0.75, sodium 138, potassium 4.9, chloride of 101, CO2 26, total protein 6.7, albumin of 4.6, AST of 26, ALT of 24, alkaline phosphatase of 125.  White blood count of 4.0, hemoglobin of 12.0, hematocrit 34.3, MCV of 93.9, platelets of 245.  I discussed potentially converting the Keppra from a short acting preparation to a long-acting preparation giving it only at nighttime.  The patient tends to get drowsy for couple hours after the morning dose of his medications.  This may be due to the spike of the Keppra levels.  A Vimpat level was ordered but I do not see the report yet about this level.

## 2018-11-30 ENCOUNTER — Telehealth: Payer: Self-pay

## 2018-11-30 NOTE — Telephone Encounter (Signed)
Late Entry~ on 11/26/18 signed order was faxed to [facility] ( fax # [phone]) stating to D/C 1500 mg BID of Keppra and start 750 mg XR 4 tabs ( 3000 mg) by mouth qhs.  Confirmation was received same day.

## 2018-12-02 DIAGNOSIS — E871 Hypo-osmolality and hyponatremia: Secondary | ICD-10-CM | POA: Diagnosis not present

## 2018-12-02 DIAGNOSIS — R278 Other lack of coordination: Secondary | ICD-10-CM | POA: Diagnosis not present

## 2018-12-02 DIAGNOSIS — F4322 Adjustment disorder with anxiety: Secondary | ICD-10-CM | POA: Diagnosis not present

## 2018-12-02 DIAGNOSIS — R2689 Other abnormalities of gait and mobility: Secondary | ICD-10-CM | POA: Diagnosis not present

## 2018-12-04 DIAGNOSIS — F4322 Adjustment disorder with anxiety: Secondary | ICD-10-CM | POA: Diagnosis not present

## 2018-12-04 DIAGNOSIS — R2689 Other abnormalities of gait and mobility: Secondary | ICD-10-CM | POA: Diagnosis not present

## 2018-12-04 DIAGNOSIS — E871 Hypo-osmolality and hyponatremia: Secondary | ICD-10-CM | POA: Diagnosis not present

## 2018-12-04 DIAGNOSIS — R278 Other lack of coordination: Secondary | ICD-10-CM | POA: Diagnosis not present

## 2018-12-07 DIAGNOSIS — E871 Hypo-osmolality and hyponatremia: Secondary | ICD-10-CM | POA: Diagnosis not present

## 2018-12-07 DIAGNOSIS — R2689 Other abnormalities of gait and mobility: Secondary | ICD-10-CM | POA: Diagnosis not present

## 2018-12-07 DIAGNOSIS — F4322 Adjustment disorder with anxiety: Secondary | ICD-10-CM | POA: Diagnosis not present

## 2018-12-07 DIAGNOSIS — R278 Other lack of coordination: Secondary | ICD-10-CM | POA: Diagnosis not present

## 2018-12-08 ENCOUNTER — Telehealth: Payer: Self-pay | Admitting: Neurology

## 2018-12-08 NOTE — Telephone Encounter (Signed)
Vimpat level was good at 11.1, therapeutic range up to 15.0.

## 2018-12-09 ENCOUNTER — Telehealth: Payer: Self-pay | Admitting: Neurology

## 2018-12-09 DIAGNOSIS — F4322 Adjustment disorder with anxiety: Secondary | ICD-10-CM | POA: Diagnosis not present

## 2018-12-09 DIAGNOSIS — E871 Hypo-osmolality and hyponatremia: Secondary | ICD-10-CM | POA: Diagnosis not present

## 2018-12-09 DIAGNOSIS — R2689 Other abnormalities of gait and mobility: Secondary | ICD-10-CM | POA: Diagnosis not present

## 2018-12-09 DIAGNOSIS — R278 Other lack of coordination: Secondary | ICD-10-CM | POA: Diagnosis not present

## 2018-12-09 MED ORDER — LACOSAMIDE 200 MG PO TABS: 200.0000 mg | ORAL_TABLET | Freq: Every day | ORAL | 1 refills | Status: DC

## 2018-12-09 MED ORDER — VIMPAT 150 MG PO TABS: 150.0000 mg | ORAL_TABLET | ORAL | 1 refills | Status: DC

## 2018-12-09 NOTE — Telephone Encounter (Signed)
Pt called in and stated that he feels as if some of his medication is off , he is having trouble keeping his balance and he is needing assistance with walking.

## 2018-12-09 NOTE — Telephone Encounter (Signed)
I called and talk with the wife.  The patient continues to have a problem with being wiped out and staggery for an hour or 2 after taking his medications in the morning.  All of his blood levels of medications were good except that the Keppra levels were somewhat high, but we switched his medication to the evening with an extended release tablet.  We may try cutting back on the morning dose of Vimpat to 150 mg, keep the evening dose of 200 mg.

## 2018-12-11 DIAGNOSIS — E871 Hypo-osmolality and hyponatremia: Secondary | ICD-10-CM | POA: Diagnosis not present

## 2018-12-11 DIAGNOSIS — R2689 Other abnormalities of gait and mobility: Secondary | ICD-10-CM | POA: Diagnosis not present

## 2018-12-11 DIAGNOSIS — F4322 Adjustment disorder with anxiety: Secondary | ICD-10-CM | POA: Diagnosis not present

## 2018-12-11 DIAGNOSIS — R278 Other lack of coordination: Secondary | ICD-10-CM | POA: Diagnosis not present

## 2018-12-14 DIAGNOSIS — R2689 Other abnormalities of gait and mobility: Secondary | ICD-10-CM | POA: Diagnosis not present

## 2018-12-14 DIAGNOSIS — E871 Hypo-osmolality and hyponatremia: Secondary | ICD-10-CM | POA: Diagnosis not present

## 2018-12-14 DIAGNOSIS — R278 Other lack of coordination: Secondary | ICD-10-CM | POA: Diagnosis not present

## 2018-12-14 DIAGNOSIS — F4322 Adjustment disorder with anxiety: Secondary | ICD-10-CM | POA: Diagnosis not present

## 2018-12-16 DIAGNOSIS — R2689 Other abnormalities of gait and mobility: Secondary | ICD-10-CM | POA: Diagnosis not present

## 2018-12-16 DIAGNOSIS — F4322 Adjustment disorder with anxiety: Secondary | ICD-10-CM | POA: Diagnosis not present

## 2018-12-16 DIAGNOSIS — E871 Hypo-osmolality and hyponatremia: Secondary | ICD-10-CM | POA: Diagnosis not present

## 2018-12-16 DIAGNOSIS — R278 Other lack of coordination: Secondary | ICD-10-CM | POA: Diagnosis not present

## 2018-12-21 DIAGNOSIS — E871 Hypo-osmolality and hyponatremia: Secondary | ICD-10-CM | POA: Diagnosis not present

## 2018-12-21 DIAGNOSIS — R278 Other lack of coordination: Secondary | ICD-10-CM | POA: Diagnosis not present

## 2018-12-21 DIAGNOSIS — R2689 Other abnormalities of gait and mobility: Secondary | ICD-10-CM | POA: Diagnosis not present

## 2018-12-21 DIAGNOSIS — F4322 Adjustment disorder with anxiety: Secondary | ICD-10-CM | POA: Diagnosis not present

## 2018-12-22 ENCOUNTER — Other Ambulatory Visit: Payer: Self-pay | Admitting: Internal Medicine

## 2018-12-23 ENCOUNTER — Other Ambulatory Visit: Payer: Self-pay

## 2018-12-23 ENCOUNTER — Telehealth: Payer: Self-pay | Admitting: Neurology

## 2018-12-23 ENCOUNTER — Ambulatory Visit: Payer: Medicare Other | Admitting: *Deleted

## 2018-12-23 DIAGNOSIS — R2689 Other abnormalities of gait and mobility: Secondary | ICD-10-CM | POA: Diagnosis not present

## 2018-12-23 DIAGNOSIS — F4322 Adjustment disorder with anxiety: Secondary | ICD-10-CM | POA: Diagnosis not present

## 2018-12-23 DIAGNOSIS — I482 Chronic atrial fibrillation, unspecified: Secondary | ICD-10-CM | POA: Diagnosis not present

## 2018-12-23 DIAGNOSIS — R278 Other lack of coordination: Secondary | ICD-10-CM | POA: Diagnosis not present

## 2018-12-23 DIAGNOSIS — E871 Hypo-osmolality and hyponatremia: Secondary | ICD-10-CM | POA: Diagnosis not present

## 2018-12-23 LAB — POCT INR: INR: 3.9 — AB (ref 2.0–3.0)

## 2018-12-23 NOTE — Telephone Encounter (Signed)
I reached out to the pt. Pt reports he has been unsteady on his feet as of late and believes  this could be related to his keppra dosage or the combination of his vimpat, keppra and dilantin. Pt states his last fall was 3-4 days ago with no serious injury. Pt reports he is unable to walk with out the use of his walker currently.   Best call back for the pt is [phone].  Wife's # is [phone]/

## 2018-12-23 NOTE — Telephone Encounter (Signed)
Pt called stating he is feeling "unsteady" and not being able to walk without his walker. Pt thinks his medication is the cause of this. Pt would like to speak to RN. Please advise.

## 2018-12-23 NOTE — Telephone Encounter (Signed)
I called the number for the wife, left a message, I will call back later.

## 2018-12-23 NOTE — Telephone Encounter (Signed)
I called the wife again, left a message, I will call back tomorrow.

## 2018-12-24 MED ORDER — PHENYTOIN SODIUM EXTENDED 100 MG PO CAPS: ORAL_CAPSULE | ORAL | 0 refills | Status: DC

## 2018-12-24 NOTE — Telephone Encounter (Signed)
I reached out to [facility] and left a vm and advised of Dr. Jannifer Franklin instructions regarding the Dilantin.

## 2018-12-24 NOTE — Telephone Encounter (Signed)
I called the patient.  The patient continues to have some gait instability in the morning, he tends to get better later in the day.  I will alter his timing of dosing of the Dilantin to take 100 mg capsule in the morning and 300 mg in the evening, we will not change the 30 mg capsule dosing.   The patient resides at [facility], the number there is [phone].  The fax number is [phone].

## 2018-12-29 MED ORDER — VIMPAT 150 MG PO TABS: 150.0000 mg | ORAL_TABLET | Freq: Two times a day (BID) | ORAL | 1 refills | Status: DC

## 2018-12-29 NOTE — Telephone Encounter (Signed)
I called and talk with the patient.  The patient is still having problems feeling staggering in the morning.  I do not want to lower the dose of Dilantin, we will lower the dose of Vimpat to 150 mg twice daily.

## 2018-12-29 NOTE — Addendum Note (Signed)
Addended by: Kathrynn Ducking on: 12/29/2018 12:50 PM   Modules accepted: Orders

## 2018-12-29 NOTE — Telephone Encounter (Signed)
Pt called in and stated he is still groggy in the morning and still having a unsteady gait. He states the med change doesn't seem to be working.

## 2018-12-30 ENCOUNTER — Encounter: Payer: Self-pay | Admitting: Internal Medicine

## 2018-12-30 ENCOUNTER — Other Ambulatory Visit: Payer: Self-pay

## 2018-12-30 ENCOUNTER — Non-Acute Institutional Stay: Payer: Medicare Other | Admitting: Internal Medicine

## 2018-12-30 VITALS — BP 130/80 | HR 80 | Temp 97.5°F | Ht 68.0 in | Wt 165.0 lb

## 2018-12-30 DIAGNOSIS — R350 Frequency of micturition: Secondary | ICD-10-CM

## 2018-12-30 DIAGNOSIS — G40919 Epilepsy, unspecified, intractable, without status epilepticus: Secondary | ICD-10-CM

## 2018-12-30 DIAGNOSIS — N401 Enlarged prostate with lower urinary tract symptoms: Secondary | ICD-10-CM

## 2018-12-30 DIAGNOSIS — I482 Chronic atrial fibrillation, unspecified: Secondary | ICD-10-CM

## 2018-12-30 DIAGNOSIS — R2689 Other abnormalities of gait and mobility: Secondary | ICD-10-CM | POA: Diagnosis not present

## 2018-12-30 DIAGNOSIS — F4322 Adjustment disorder with anxiety: Secondary | ICD-10-CM | POA: Diagnosis not present

## 2018-12-30 DIAGNOSIS — E871 Hypo-osmolality and hyponatremia: Secondary | ICD-10-CM | POA: Diagnosis not present

## 2018-12-30 DIAGNOSIS — G903 Multi-system degeneration of the autonomic nervous system: Secondary | ICD-10-CM | POA: Diagnosis not present

## 2018-12-30 DIAGNOSIS — R278 Other lack of coordination: Secondary | ICD-10-CM | POA: Diagnosis not present

## 2018-12-30 DIAGNOSIS — G20 Parkinson's disease: Secondary | ICD-10-CM

## 2018-12-30 DIAGNOSIS — I951 Orthostatic hypotension: Secondary | ICD-10-CM

## 2018-12-30 IMAGING — CT CT CERVICAL SPINE W/O CM
5 of 8 series · 11 of 33 positions shown, 12 images · non-contrast
Comparison: Head CT 03/31/2005

CLINICAL DATA: Maxillofacial blunt trauma

EXAM:
CT HEAD WITHOUT CONTRAST
CT CERVICAL SPINE WITHOUT CONTRAST
TECHNIQUE: Multidetector CT imaging of the head and cervical spine was
performed following the standard protocol without intravenous
contrast. Multiplanar CT image reconstructions of the cervical spine
were also generated.

[Series 5: head bone · axial · 0.46mm/px · z∈[-81,-21]mm · 2 of 91 slices shown]
[im 31/91  bone]
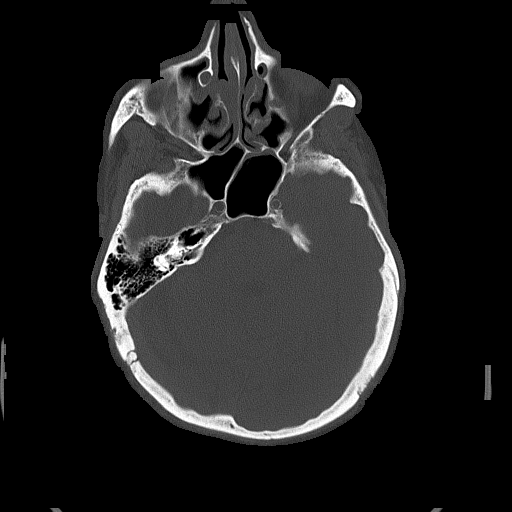
[im 61/91  bone]
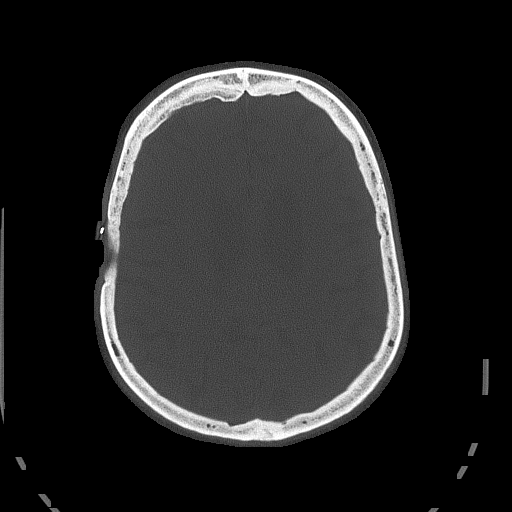

[Series 8: c_spine 2.0 st · axial · 0.37mm/px · z∈[-212,-150]mm · 2 of 94 slices shown, 3 images]
[im 32/94  soft-tissue]
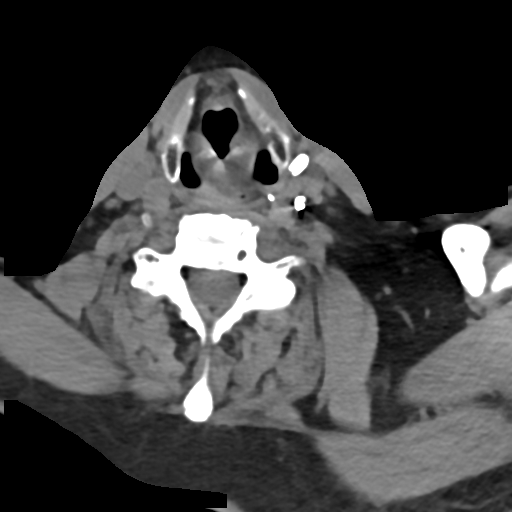
[im 32/94  bone]
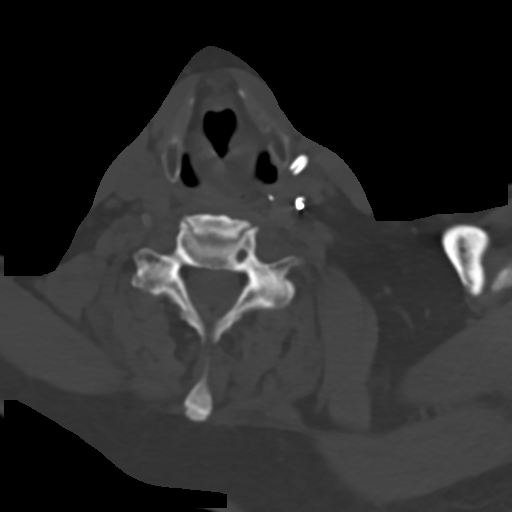
[im 63/94  bone]
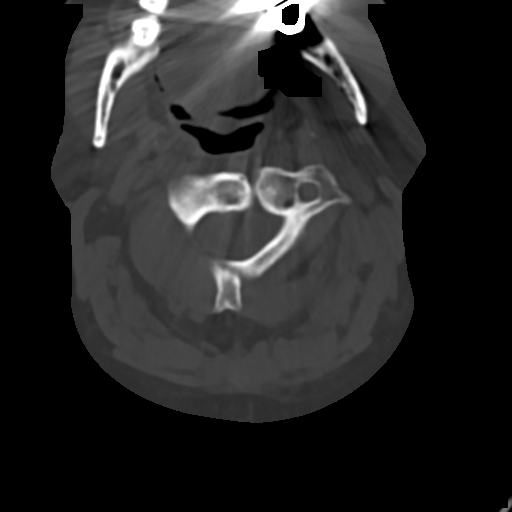

[Series 10: c_spine 2.0 sag bone · sagittal · 0.28mm/px · 4 of 61 slices shown]
[im 13/61  bone]
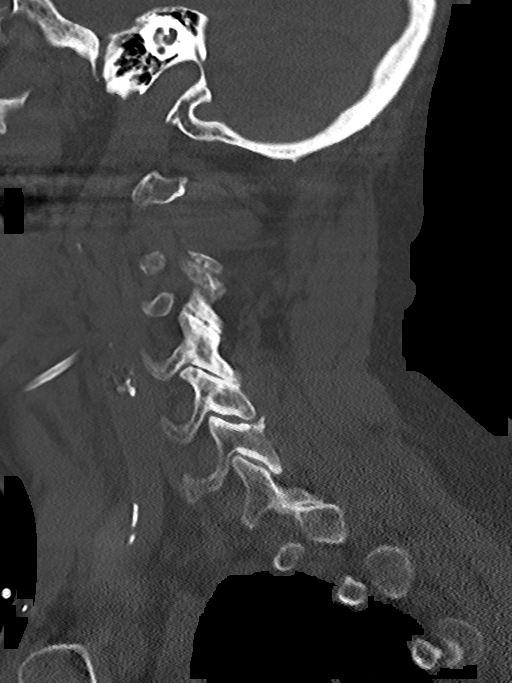
[im 25/61  bone]
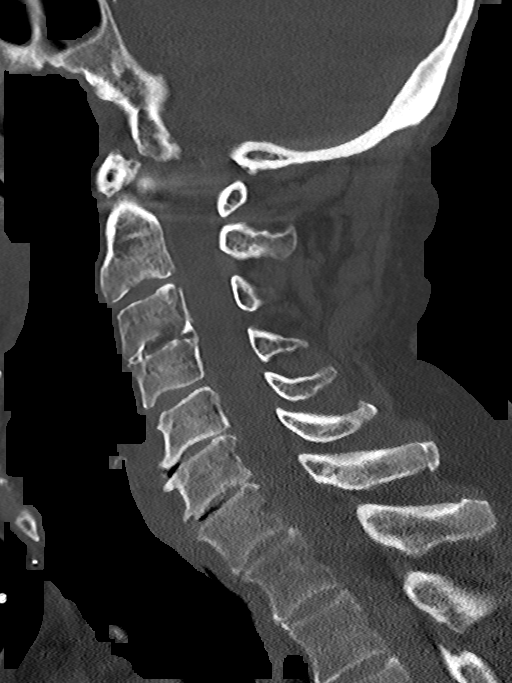
[im 37/61  bone]
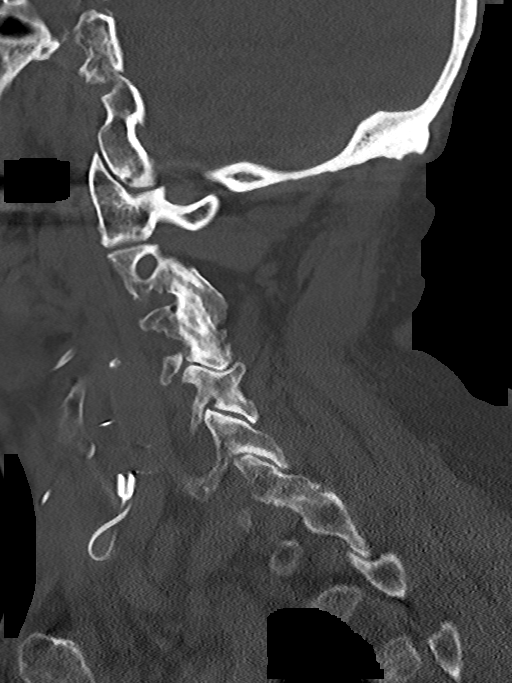
[im 49/61  bone]
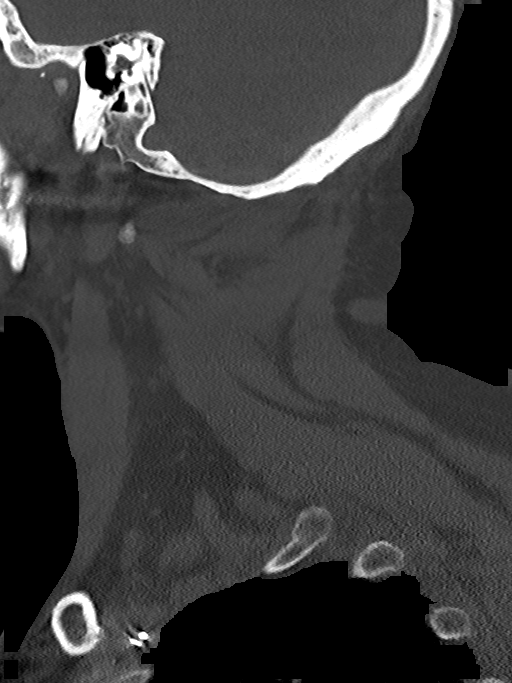

[Series 11: c_spine 2.0 cor bone · coronal · 0.28mm/px · 1 of 73 slices shown]
[im 37/73  bone]
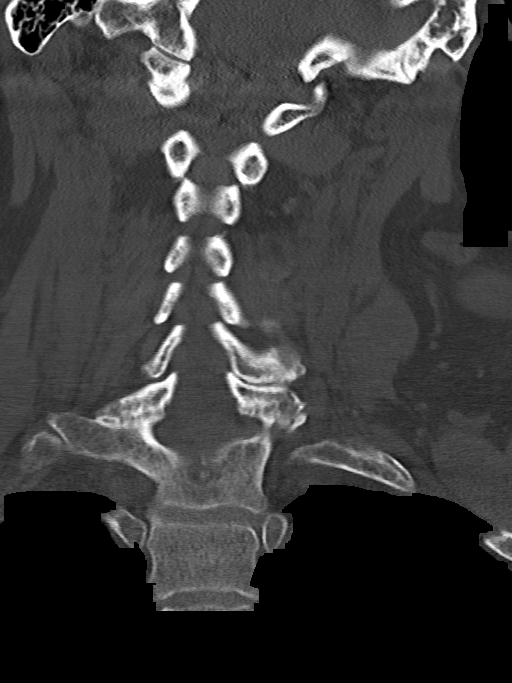

[Series 12: c_spine 2.0 orthogonals · axial · 0.21mm/px · z∈[-243,-181]mm · 2 of 89 slices shown]
[im 30/89  bone]
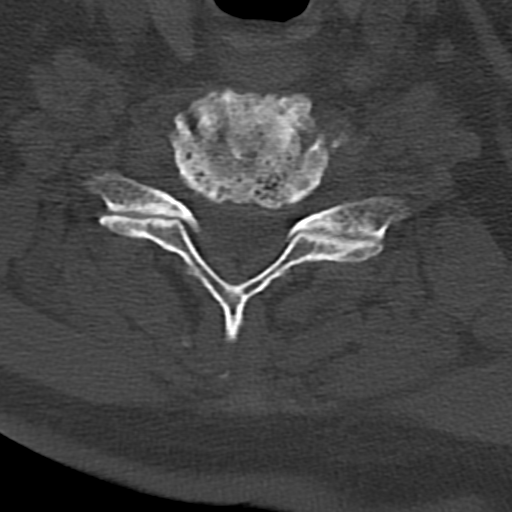
[im 59/89  bone]
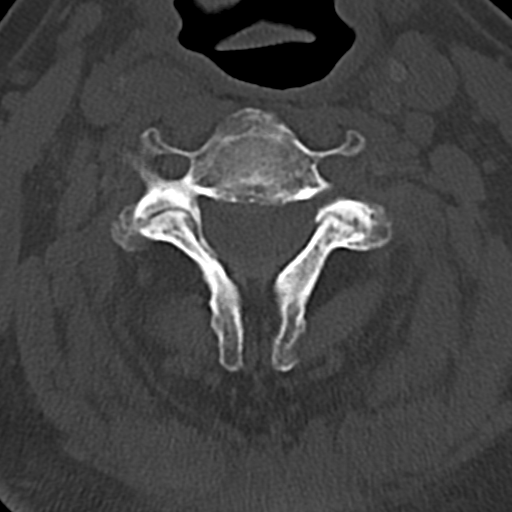

[11 of 33 positions shown; findings below may reference images not displayed]

FINDINGS: CT HEAD FINDINGS

Brain: Encephalomalacia involving the right temporal lobe with
overlying craniotomy change of the right temporoparietal skull.
Ventriculomegaly is identified with mild ex vacuo dilatation
secondary to right temporal lobe encephalomalacia. Mild-to-moderate
small vessel ischemic disease of periventricular subcortical white
matter extending into the centrum semiovale on the right. No
intra-axial mass nor extra-axial fluid collections. Minimal small
vessel ischemic changes of the right pons. No intra-axial mass nor
extra-axial fluid collections.

Vascular: No hyperdense vessel sign.

Skull: Craniotomy changes of the right temporal and parietal skull.
No acute osseous abnormality.

Sinuses/Orbits: Mild-to-moderate paranasal sinus mucosal thickening.
No air-fluid levels. Bilateral lens replacements.

Other: Right frontoparietal scalp contusion and laceration.

CT CERVICAL SPINE FINDINGS

Alignment: The patient's head is tilted to the left. Maintained
cervical lordosis is identified. The craniocervical relationship is
intact.

Skull base and vertebrae: No acute fracture of the skull base or
cervical spine.

Soft tissues and spinal canal: No prevertebral soft tissue swelling.
No visible canal hematoma.

Disc levels: Moderate-to-marked disc flattening C3-4, C4-5 and to a
greater extent C5-6 and C6-7. Mild disc flattening noted at C2-3 and
along the upper included thoracic spine to T3. Uncovertebral joint
osteoarthritis is noted bilaterally at C3-4, the right at C4-5 and
bilaterally at C5-6 and C6-7. These contribute to mild bilateral
foraminal encroachment. Degenerative cystic change noted of C6 with
probable small vertebral hemangioma.

Upper chest: Clear lung apices.  No pneumothorax.

Other: Extracranial carotid arteriosclerosis bilaterally. Pacemaker
generator projects over the left anterior chest wall and is
partially imaged..
IMPRESSION: 1. Encephalomalacia of the right temporal lobe with overlying
craniotomy change of the skull. No acute intracranial abnormality.
2. Likely chronic small vessel ischemic disease of periventricular
white matter.
3. Cervical spondylosis without acute cervical spine fracture or
static listhesis.

## 2018-12-30 NOTE — Progress Notes (Signed)
Location:  Occupational psychologist of Service:  Clinic (12)  Provider: Winferd Wease L. Mariea Clonts, D.O., C.M.D.  Code Status: DNR Goals of Care:  Advanced Directives 11/03/2018  Does Patient Have a Medical Advance Directive? Yes  Type of Paramedic of [place];Living will  Does patient want to make changes to medical advance directive? No - Patient declined  Copy of [place] in Chart? No - copy requested  Would patient like information on creating a medical advance directive? -  Pre-existing out of facility DNR order (yellow form or pink MOST form) -   Chief Complaint  Patient presents with  . Medical Management of Chronic Issues    26mth follow-up    HPI: Patient is a 83 y.o. male seen today for medical management of chronic diseases.    Pt here and it's not for INR.  He wanted his labs consolidated so they are next week.  He also just had an increase in dilantin per Dr. Jannifer Franklin.  He's just started on vimpat.  He was knocked out for the entire morning.  He quit eating so many greens b/c his INR was 3.9 12/23/18.  He does now agree to his INR next week.  INR was 2.4 on 6/9.    He reports twitching in the bed in the morning.   Right trigger finger is new.  Wakes up with it stiff and hurts to open the finger.  He is having more urinary frequency.  He is on proscar.  He's had prior prostate biopsies that were benign.  He goes 3-4 times, but sometimes just 1-2 times at night.  He's seen a commercial for an otc product.  He's going to talk with his best friend who is a urologist, Dr. Terance Hart.  He has seen [facility] in the past.  He's run a PSA 6-8.  No difficulty with his stream.  He has not been drinking enough water.  He has to make himself drink more water.    He does better with being less sleepy if he does his walking and exercise.    He can read the newspaper w/o glasses, but his vision is not what it used to be.  He  occasionally knocks something over on his counter due to poor vision.  One seizure caused him to have his optic nerve severed so his vision on the left side is very narrow.  He goes to see Dr. Rutherford Guys.  He saw him 6-8 mos ago and has a pressure check scheduled.  He's also had cataracts removed in both eyes.    Still constipated but takes miralax which works fine.  He knows he has to hydrate better.  Past Medical History:  Diagnosis Date  . Abnormality of gait 12/21/2013  . Atrial fibrillation ([place])   . Blind left eye   . Cardiac arrest ([place])   . Glaucoma   . Hypercholesteremia   . Hyperlipidemia   . Mild left ventricular systolic dysfunction   . PSA elevation   . Right frontal lobe lesion   . Seizures ([place])   . Urinary urgency     Past Surgical History:  Procedure Laterality Date  . CARDIAC DEFIBRILLATOR PLACEMENT    . cataract surgery    . COLONOSCOPY    . CRANIOTOMY Right    right anterior temporal resection  . defibrillator replaced  March 2016  . IMPLANTATION VAGAL NERVE STIMULATOR  June 2016  . NASAL SINUS SURGERY    .  TONSILLECTOMY     age 31  . VASECTOMY  1985    Allergies  Allergen Reactions  . Depakote [Divalproex Sodium] Other (See Comments)    Arthralgias     Outpatient Encounter Medications as of 12/30/2018  Medication Sig  . ALPRAZolam (XANAX) 0.25 MG tablet Take 1 tablet (0.25 mg total) by mouth 2 (two) times daily.  . brimonidine-timolol (COMBIGAN) 0.2-0.5 % ophthalmic solution Place 1 drop into both eyes every 12 (twelve) hours.   . cholecalciferol (VITAMIN D) 400 units TABS tablet Take 400 Units by mouth 2 (two) times daily.  . finasteride (PROSCAR) 5 MG tablet Take 5 mg by mouth daily.   . flecainide (TAMBOCOR) 50 MG tablet Take 50 mg by mouth 2 (two) times daily.  . fluocinonide ointment (LIDEX) 4.33 % Apply 1 application topically 2 (two) times daily as needed (on rash /skin folds, not for face).   . Lacosamide (VIMPAT) 150 MG TABS Take 1 tablet  (150 mg total) by mouth 2 (two) times a day.  . latanoprost (XALATAN) 0.005 % ophthalmic solution Place 1 drop into both eyes at bedtime.  . Levetiracetam (KEPPRA XR) 750 MG TB24 Take 4 tablets (3,000 mg total) by mouth at bedtime.  Marland Kitchen LORazepam (ATIVAN) 2 MG/ML injection Inject 2 mg into the muscle as needed for seizure. NOT TO BE USED FOR BEHAVIORS  . phenytoin (DILANTIN) 100 MG ER capsule 1 capsule in the morning, 3 capsules in the evening  . phenytoin (DILANTIN) 30 MG ER capsule Take 1 capsule (30 mg total) by mouth every other day.  . polyethylene glycol (MIRALAX / GLYCOLAX) packet Take 17 g by mouth every other day.   . sodium chloride 1 g tablet Take 1 g by mouth daily.  Marland Kitchen triamcinolone cream (KENALOG) 0.1 % Apply 1 application topically 2 (two) times daily as needed (for flares).   . warfarin (COUMADIN) 2.5 MG tablet Take 1 tablet (2.5 mg total) by mouth daily at 12 noon.   No facility-administered encounter medications on file as of 12/30/2018.     Review of Systems:  Review of Systems  Constitutional: Negative for chills, fever and malaise/fatigue.  HENT: Positive for hearing loss.   Eyes: Negative for blurred vision.  Respiratory: Negative for cough and shortness of breath.   Cardiovascular: Negative for chest pain, palpitations and leg swelling.  Gastrointestinal: Negative for abdominal pain.  Genitourinary: Positive for frequency. Negative for dysuria, hematuria and urgency.  Musculoskeletal: Positive for falls. Negative for myalgias.  Skin: Positive for itching and rash.  Neurological: Positive for dizziness and seizures. Negative for loss of consciousness.  Endo/Heme/Allergies: Bruises/bleeds easily.  Psychiatric/Behavioral: Positive for memory loss. Negative for depression. The patient is nervous/anxious. The patient does not have insomnia.     Health Maintenance  Topic Date Due  . INFLUENZA VACCINE  01/16/2019  . TETANUS/TDAP  12/06/2025  . PNA vac Low Risk Adult   Completed    Physical Exam: Vitals:   12/30/18 0902  BP: 130/80  Pulse: 80  Temp: (!) 97.5 F (36.4 C)  TempSrc: Oral  SpO2: 97%  Weight: 165 lb (74.8 kg)  Height: 5\' 8"  (1.727 m)   Body mass index is 25.09 kg/m. Physical Exam Vitals signs and nursing note reviewed.  Constitutional:      General: He is not in acute distress.    Appearance: He is normal weight. He is not toxic-appearing.  HENT:     Head: Normocephalic and atraumatic.  Cardiovascular:     Rate  and Rhythm: Rhythm irregular.     Pulses: Normal pulses.  Pulmonary:     Effort: Pulmonary effort is normal.     Breath sounds: Normal breath sounds.  Abdominal:     General: Bowel sounds are normal.  Musculoskeletal: Normal range of motion.     Comments: Unsteady gait with rollator walker  Skin:    General: Skin is warm and dry.  Neurological:     Mental Status: He is alert. Mental status is at baseline.     Labs reviewed: Basic Metabolic Panel: Recent Labs    11/02/18 1406 11/03/18 0338 11/04/18 0247 11/24/18 0300  NA 126* 125* 134* 138  K 4.0 3.8 4.2 4.9  CL 93* 96* 102  --   CO2 21* 20* 24  --   GLUCOSE 108* 92 99  --   BUN 11 9 7* 12  CREATININE 0.79 0.70 0.68 0.8  CALCIUM 8.5* 8.2* 8.4*  --    Liver Function Tests: Recent Labs    03/04/18 1311 03/27/18 1154 10/05/18 2016 11/24/18 0300  AST 29  --  29 26  ALT 25  --  23 24  ALKPHOS 128*  --  171* 125  BILITOT 0.2  --  0.3  --   PROT 7.3  --  7.9  --   ALBUMIN 4.6 3.8 4.6  --    No results for input(s): LIPASE, AMYLASE in the last 8760 hours. No results for input(s): AMMONIA in the last 8760 hours. CBC: Recent Labs    10/05/18 2016 11/02/18 1406 11/03/18 0338 11/04/18 0247 11/24/18  WBC 6.1 2.2* 3.6* 3.5* 4.1  NEUTROABS 3.3 0.7* 0.9*  --   --   HGB 14.2 10.9* 11.6* 10.9* 12.0*  HCT 39.5 30.6* 31.1* 29.4* 34*  MCV 94.5 88.4 87.4 88.8  --   PLT 227 167 192 190 245   Lipid Panel: No results for input(s): CHOL, HDL, LDLCALC,  TRIG, CHOLHDL, LDLDIRECT in the last 8760 hours. No results found for: HGBA1C  Assessment/Plan 1. Benign prostatic hyperplasia with urinary frequency -pt may f/u with [facility] -cont proscar   2. Chronic atrial fibrillation -has pacemaker in place -on warfarin for INR goal 2-3; recheck next week as planned due to medication changes for his epilepsy -he likes to avoid getting stuck different times for labs for me and for Dr. Jannifer Franklin so I'm trying to synchronize these -I need to be aware when his meds for his seizures are adjusted, especially the dilantin, because it alters his warfarin levels  3. Refractory epilepsy ([place]) -had prior hemihippocampectomy, is s/p vagal stimulator -newly put on vimpat now in addition to his dilantin, keppra  4. Parkinsonian syndrome associated with symptomatic orthostatic hypotension (H[facility]) -continues with frequent falls and mobility issues as well as cognitive losses--prior physician at [facility] felt he may have multisystem atrophy -here his seizures are being addressed but this has not been by neurology at this point  Labs/tests ordered:  INR as scheduled next week Next appt:  01/06/2019  Jahmir Salo L. Merrillyn Ackerley, D.O. [facility] [address], [place] [address] Cell Phone (Mon-Fri 8am-5pm):  [phone] On Call:  [phone] & follow prompts after 5pm & weekends Office Phone:  [phone] Office Fax:  [phone]

## 2019-01-01 ENCOUNTER — Telehealth: Payer: Self-pay

## 2019-01-01 ENCOUNTER — Other Ambulatory Visit: Payer: Self-pay | Admitting: Adult Health

## 2019-01-01 MED ORDER — ALPRAZOLAM 0.25 MG PO TABS: 0.2500 mg | ORAL_TABLET | Freq: Two times a day (BID) | ORAL | 2 refills | Status: DC

## 2019-01-01 NOTE — Telephone Encounter (Signed)
This is fine.  I'm glad he's going to get seen for his urinary frequency.  If he can please ask his new urologist to send me a note so I am aware of his findings, that will be excellent.

## 2019-01-01 NOTE — Telephone Encounter (Signed)
Patient called to inform Dr.Reed that he has issues with frequent urination. Patient was under the care of a urologist that retired a long time ago. Patient called [facility] today and they are going to see him on January 21, 2019 with a different provider.  Patient states he does not need anything from [place] at this time yet he is calling as a FYI for he knows Dr.Reed likes to do things her way and he would like to make sure she is ok with what he did.

## 2019-01-04 ENCOUNTER — Telehealth: Payer: Self-pay | Admitting: Neurology

## 2019-01-04 NOTE — Telephone Encounter (Signed)
Called patient, no answer. Recording stated please enter your remote access code. I will try to call patient again later.

## 2019-01-04 NOTE — Telephone Encounter (Signed)
FYI-Pt called wanting to let the provider know that the adjustment that was made to his Lacosamide (VIMPAT) 150 MG TABS is working well and he would like to thank the provider.

## 2019-01-04 NOTE — Telephone Encounter (Signed)
Events noted

## 2019-01-05 NOTE — Telephone Encounter (Signed)
Discussed Dr.Reed's response with patient. Patient verbalized understanding

## 2019-01-06 ENCOUNTER — Ambulatory Visit: Payer: Medicare Other

## 2019-01-06 ENCOUNTER — Other Ambulatory Visit: Payer: Self-pay

## 2019-01-20 ENCOUNTER — Other Ambulatory Visit: Payer: Self-pay

## 2019-01-20 ENCOUNTER — Encounter: Payer: Self-pay | Admitting: Neurology

## 2019-01-20 ENCOUNTER — Ambulatory Visit (INDEPENDENT_AMBULATORY_CARE_PROVIDER_SITE_OTHER): Payer: Medicare Other | Admitting: Neurology

## 2019-01-20 VITALS — BP 127/74 | HR 61 | Temp 96.9°F | Ht 69.0 in | Wt 168.0 lb

## 2019-01-20 DIAGNOSIS — G40909 Epilepsy, unspecified, not intractable, without status epilepticus: Secondary | ICD-10-CM | POA: Diagnosis not present

## 2019-01-20 DIAGNOSIS — G609 Hereditary and idiopathic neuropathy, unspecified: Secondary | ICD-10-CM

## 2019-01-20 DIAGNOSIS — R269 Unspecified abnormalities of gait and mobility: Secondary | ICD-10-CM | POA: Diagnosis not present

## 2019-01-20 DIAGNOSIS — Z9689 Presence of other specified functional implants: Secondary | ICD-10-CM

## 2019-01-20 DIAGNOSIS — Z5181 Encounter for therapeutic drug level monitoring: Secondary | ICD-10-CM | POA: Diagnosis not present

## 2019-01-20 NOTE — Progress Notes (Signed)
Reason for visit: Seizures  Cameron Potts is an 83 y.o. male  History of present illness:  Cameron Potts is an 83 year old right-handed white male with a history of intractable seizures.  The patient is on Dilantin, Vimpat, and Keppra.  The patient has had some troubles with slight drowsiness and gait instability in the morning that clears up within several hours.  Drinking a half a cup of coffee seems to help the drowsiness, we have cut back on the Vimpat to 150 mg twice daily which may have helped some of the walking issues.  He does fall on occasion, even with a walker.  He does have a history of orthostatic hypotension, he takes salt tablets for this.  He has been told that he may have Parkinson's disease or multisystems atrophy.  He has gone through some physical therapy since a hospitalization on 02 Nov 2018.  He went to the hospital with a fever, he may have had a seizure in the emergency room.  The patient has never been able to get back to walking only with a cane.  He returns here for an evaluation.  He has a vagal nerve stimulator in place.  Past Medical History:  Diagnosis Date  . Abnormality of gait 12/21/2013  . Atrial fibrillation ([place])   . Blind left eye   . Carcinoma in situ of prostate   . Cardiac arrest ([place])   . Glaucoma   . Hypercholesteremia   . Hyperlipidemia   . Metabolic encephalopathy   . Mild left ventricular systolic dysfunction   . Presence of other specified functional implants   . PSA elevation   . Right frontal lobe lesion   . Seizures ([place])   . Tinea pedis   . 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 Urinary urgency     Past Surgical History:  Procedure Laterality Date  . CARDIAC DEFIBRILLATOR PLACEMENT    . cataract surgery    . COLONOSCOPY    . CRANIOTOMY Right    right anterior temporal resection  . defibrillator replaced  March 2016  . IMPLANTATION VAGAL NERVE STIMULATOR  June 2016  . NASAL SINUS SURGERY    . TONSILLECTOMY     age 73  .  VASECTOMY  1985    Family History  Problem Relation Age of Onset  . Cancer - Lung Mother   . Cancer - Prostate Father     Social history:  reports that he has quit smoking. His smoking use included cigarettes. He has a 7.50 pack-year smoking history. He has never used smokeless tobacco. He reports that he does not drink alcohol or use drugs.    Allergies  Allergen Reactions  . Depakote [Divalproex Sodium] Other (See Comments)    Arthralgias     Medications:  Prior to Admission medications   Medication Sig Start Date End Date Taking? Authorizing Provider  acetaminophen (TYLENOL) 325 MG tablet Take 650 mg by mouth every 6 (six) hours as needed.   Yes [provider]  ALPRAZolam (XANAX) 0.25 MG tablet Take 1 tablet (0.25 mg total) by mouth 2 (two) times daily. 01/01/19  Yes Wert, Margreta Journey, NP  brimonidine-timolol (COMBIGAN) 0.2-0.5 % ophthalmic solution Place 1 drop into both eyes every 12 (twelve) hours.  01/05/18  Yes [provider]  cholecalciferol (VITAMIN D) 400 units TABS tablet Take 400 Units by mouth 2 (two) times daily.   Yes [provider]  finasteride (PROSCAR) 5 MG tablet Take 5 mg by mouth daily.  Yes [provider]  flecainide (TAMBOCOR) 50 MG tablet Take 50 mg by mouth 2 (two) times daily.   Yes [provider]  fluocinonide ointment (LIDEX) 7.02 % Apply 1 application topically 2 (two) times daily as needed (on rash /skin folds, not for face).    Yes [provider]  ketoconazole (NIZORAL) 2 % shampoo Apply 1 application topically 2 (two) times a week.   Yes [provider]  Lacosamide (VIMPAT) 150 MG TABS Take 1 tablet (150 mg total) by mouth 2 (two) times a day. 12/29/18  Yes Kathrynn Ducking, MD  latanoprost (XALATAN) 0.005 % ophthalmic solution Place 1 drop into both eyes at bedtime.   Yes [provider]  Levetiracetam (KEPPRA XR) 750 MG TB24 Take 4 tablets (3,000 mg total) by mouth at  bedtime. 11/25/18  Yes Kathrynn Ducking, MD  LORazepam (ATIVAN) 2 MG/ML injection Inject 2 mg into the muscle as needed for seizure. NOT TO BE USED FOR BEHAVIORS   Yes [provider]  phenytoin (DILANTIN) 100 MG ER capsule 1 capsule in the morning, 3 capsules in the evening 12/24/18  Yes Kathrynn Ducking, MD  polyethylene glycol [facility] / GLYCOLAX) packet Take 17 g by mouth every other day. And once daily as needed.   Yes [provider]  sodium chloride 1 g tablet Take 1 g by mouth daily.   Yes [provider]  triamcinolone cream (KENALOG) 0.1 % Apply 1 application topically 2 (two) times daily as needed (for flares).    Yes [provider]  warfarin (COUMADIN) 2.5 MG tablet Take 1 tablet (2.5 mg total) by mouth daily at 12 noon. Patient taking differently: Take by mouth. 2.5 mg each day of week at 12 pm except on Monday take 2.75 mg at 12 pm 10/14/18  Yes Reed, Tiffany L, DO  phenytoin (DILANTIN) 30 MG ER capsule Take 1 capsule (30 mg total) by mouth every other day. 09/24/18   Kathrynn Ducking, MD    ROS:  Out of a complete 14 system review of symptoms, the patient complains only of the following symptoms, and all other reviewed systems are negative.  Walking problems Drowsiness Seizures  Blood pressure 127/74, pulse 61, temperature (!) 96.9 F (36.1 C), height 5\' 9"  (1.753 m), weight 168 lb (76.2 kg).   Blood pressure, right arm, sitting is 637 systolic.  Blood pressure, right arm, standing is 858 systolic.  Physical Exam  General: The patient is alert and cooperative at the time of the examination.  Skin: 1+ edema below the knees is seen bilaterally..   Neurologic Exam  Mental status: The patient is alert and oriented x 3 at the time of the examination. The patient has apparent normal recent and remote memory, with an apparently normal attention span and concentration ability.   Cranial nerves: Facial symmetry is present. Speech is normal,  no aphasia or dysarthria is noted. Extraocular movements are full. Visual fields are full.  Motor: The patient has good strength in all 4 extremities.  Sensory examination: Soft touch sensation is symmetric on the face, arms, and legs.  Coordination: The patient has good finger-nose-finger and heel-to-shin bilaterally.  Gait and station: The patient has a slightly wide-based gait, the patient is able to ambulate fairly well with a walker, has good stride and good turns with this.  Romberg is unsteady.  Reflexes: Deep tendon reflexes are symmetric.   Assessment/Plan:  1.  Intractable seizure disorder  2.  Gait disorder  3.  History of orthostatic hypotension  4.  Peripheral neuropathy  5.  Vagal nerve stimulator placement, right upper chest  The vagal nerve stimulator was interrogated, the settings were not changed, the battery appears to be doing well.  The patient will continue with his current medications at the current doses, we will check blood work today.  The patient does not appear to be orthostatic today.  He will continue use the walker at all times.  We will follow-up in 6 months.  Jill Alexanders MD 01/20/2019 11:18 AM  [facility] [address] [place], [place] [address]  Phone [phone] Fax [phone]

## 2019-01-21 DIAGNOSIS — R972 Elevated prostate specific antigen [PSA]: Secondary | ICD-10-CM | POA: Diagnosis not present

## 2019-01-21 DIAGNOSIS — R350 Frequency of micturition: Secondary | ICD-10-CM | POA: Diagnosis not present

## 2019-01-21 DIAGNOSIS — N401 Enlarged prostate with lower urinary tract symptoms: Secondary | ICD-10-CM | POA: Diagnosis not present

## 2019-01-26 ENCOUNTER — Telehealth: Payer: Self-pay | Admitting: Neurology

## 2019-01-26 LAB — COMPREHENSIVE METABOLIC PANEL
ALT: 19 IU/L (ref 0–44)
AST: 25 IU/L (ref 0–40)
Albumin/Globulin Ratio: 1.8 (ref 1.2–2.2)
Albumin: 4.6 g/dL (ref 3.6–4.6)
Alkaline Phosphatase: 145 IU/L — ABNORMAL HIGH (ref 39–117)
BUN/Creatinine Ratio: 14 (ref 10–24)
BUN: 10 mg/dL (ref 8–27)
Bilirubin Total: 0.3 mg/dL (ref 0.0–1.2)
CO2: 19 mmol/L — ABNORMAL LOW (ref 20–29)
Calcium: 9.2 mg/dL (ref 8.6–10.2)
Chloride: 89 mmol/L — ABNORMAL LOW (ref 96–106)
Creatinine, Ser: 0.71 mg/dL — ABNORMAL LOW (ref 0.76–1.27)
GFR calc Af Amer: 102 mL/min/{1.73_m2} (ref >59)
GFR calc non Af Amer: 88 mL/min/{1.73_m2} (ref >59)
Globulin, Total: 2.6 g/dL (ref 1.5–4.5)
Glucose: 88 mg/dL (ref 65–99)
Potassium: 5.1 mmol/L (ref 3.5–5.2)
Sodium: 126 mmol/L — ABNORMAL LOW (ref 134–144)
Total Protein: 7.2 g/dL (ref 6.0–8.5)

## 2019-01-26 LAB — CBC WITH DIFFERENTIAL/PLATELET
Basophils Absolute: 0.1 10*3/uL (ref 0.0–0.2)
Basos: 2 %
EOS (ABSOLUTE): 0.6 10*3/uL — ABNORMAL HIGH (ref 0.0–0.4)
Eos: 12 %
Hematocrit: 36.1 % — ABNORMAL LOW (ref 37.5–51.0)
Hemoglobin: 12.7 g/dL — ABNORMAL LOW (ref 13.0–17.7)
Immature Grans (Abs): 0 10*3/uL (ref 0.0–0.1)
Immature Granulocytes: 0 %
Lymphocytes Absolute: 1.5 10*3/uL (ref 0.7–3.1)
Lymphs: 30 %
MCH: 32.2 pg (ref 26.6–33.0)
MCHC: 35.2 g/dL (ref 31.5–35.7)
MCV: 91 fL (ref 79–97)
Monocytes Absolute: 0.5 10*3/uL (ref 0.1–0.9)
Monocytes: 11 %
Neutrophils Absolute: 2.2 10*3/uL (ref 1.4–7.0)
Neutrophils: 45 %
Platelets: 282 10*3/uL (ref 150–450)
RBC: 3.95 x10E6/uL — ABNORMAL LOW (ref 4.14–5.80)
RDW: 12.2 % (ref 11.6–15.4)
WBC: 4.8 10*3/uL (ref 3.4–10.8)

## 2019-01-26 LAB — LACOSAMIDE: Lacosamide: 9.2 ug/mL (ref 5.0–10.0)

## 2019-01-26 LAB — LEVETIRACETAM LEVEL: Levetiracetam Lvl: 30.2 ug/mL (ref 10.0–40.0)

## 2019-01-26 LAB — PHENYTOIN LEVEL, TOTAL: Phenytoin (Dilantin), Serum: 21.1 ug/mL (ref 10.0–20.0)

## 2019-01-26 NOTE — Telephone Encounter (Signed)
I called the patient.  The sodium level has gone down to 126, he is drinking a lot of fluids, he may need to restrict some of the fluids, this may be in part related to the Dilantin.  Dilantin is in the low toxic range, we will stop the 30 mg capsule, recheck the blood work in 2 weeks.   The patient resides at [facility], the number there is [phone].  The fax number is [phone].

## 2019-01-29 ENCOUNTER — Other Ambulatory Visit: Payer: Self-pay

## 2019-01-29 ENCOUNTER — Inpatient Hospital Stay (HOSPITAL_COMMUNITY)
Admission: EM | Admit: 2019-01-29 | Discharge: 2019-02-02 | DRG: 101 | Disposition: A | Discharge status: Home Health | Payer: Medicare Other | Attending: Family Medicine | Admitting: Family Medicine

## 2019-01-29 ENCOUNTER — Emergency Department (HOSPITAL_COMMUNITY): Payer: Medicare Other

## 2019-01-29 ENCOUNTER — Encounter (HOSPITAL_COMMUNITY): Payer: Self-pay

## 2019-01-29 DIAGNOSIS — I4891 Unspecified atrial fibrillation: Secondary | ICD-10-CM | POA: Diagnosis present

## 2019-01-29 DIAGNOSIS — Z79899 Other long term (current) drug therapy: Secondary | ICD-10-CM

## 2019-01-29 DIAGNOSIS — F05 Delirium due to known physiological condition: Secondary | ICD-10-CM | POA: Diagnosis not present

## 2019-01-29 DIAGNOSIS — Z20828 Contact with and (suspected) exposure to other viral communicable diseases: Secondary | ICD-10-CM | POA: Diagnosis present

## 2019-01-29 DIAGNOSIS — E871 Hypo-osmolality and hyponatremia: Secondary | ICD-10-CM

## 2019-01-29 DIAGNOSIS — R5381 Other malaise: Secondary | ICD-10-CM | POA: Diagnosis present

## 2019-01-29 DIAGNOSIS — Z95810 Presence of automatic (implantable) cardiac defibrillator: Secondary | ICD-10-CM

## 2019-01-29 DIAGNOSIS — G20 Parkinson's disease: Secondary | ICD-10-CM | POA: Diagnosis present

## 2019-01-29 DIAGNOSIS — I482 Chronic atrial fibrillation, unspecified: Secondary | ICD-10-CM | POA: Diagnosis present

## 2019-01-29 DIAGNOSIS — Z7901 Long term (current) use of anticoagulants: Secondary | ICD-10-CM

## 2019-01-29 DIAGNOSIS — D6859 Other primary thrombophilia: Secondary | ICD-10-CM | POA: Diagnosis present

## 2019-01-29 DIAGNOSIS — D649 Anemia, unspecified: Secondary | ICD-10-CM | POA: Diagnosis present

## 2019-01-29 DIAGNOSIS — Z9119 Patient's noncompliance with other medical treatment and regimen: Secondary | ICD-10-CM

## 2019-01-29 DIAGNOSIS — E161 Other hypoglycemia: Secondary | ICD-10-CM | POA: Diagnosis not present

## 2019-01-29 DIAGNOSIS — E162 Hypoglycemia, unspecified: Secondary | ICD-10-CM | POA: Diagnosis not present

## 2019-01-29 DIAGNOSIS — Z7952 Long term (current) use of systemic steroids: Secondary | ICD-10-CM

## 2019-01-29 DIAGNOSIS — D6869 Other thrombophilia: Secondary | ICD-10-CM | POA: Diagnosis present

## 2019-01-29 DIAGNOSIS — R4020 Unspecified coma: Secondary | ICD-10-CM | POA: Diagnosis not present

## 2019-01-29 DIAGNOSIS — R569 Unspecified convulsions: Secondary | ICD-10-CM | POA: Diagnosis not present

## 2019-01-29 DIAGNOSIS — N400 Enlarged prostate without lower urinary tract symptoms: Secondary | ICD-10-CM | POA: Diagnosis present

## 2019-01-29 DIAGNOSIS — E785 Hyperlipidemia, unspecified: Secondary | ICD-10-CM | POA: Diagnosis present

## 2019-01-29 DIAGNOSIS — G40919 Epilepsy, unspecified, intractable, without status epilepticus: Secondary | ICD-10-CM | POA: Diagnosis not present

## 2019-01-29 DIAGNOSIS — R404 Transient alteration of awareness: Secondary | ICD-10-CM | POA: Diagnosis not present

## 2019-01-29 DIAGNOSIS — Z87891 Personal history of nicotine dependence: Secondary | ICD-10-CM

## 2019-01-29 DIAGNOSIS — Z8546 Personal history of malignant neoplasm of prostate: Secondary | ICD-10-CM

## 2019-01-29 DIAGNOSIS — Z03818 Encounter for observation for suspected exposure to other biological agents ruled out: Secondary | ICD-10-CM | POA: Diagnosis not present

## 2019-01-29 DIAGNOSIS — I951 Orthostatic hypotension: Secondary | ICD-10-CM

## 2019-01-29 DIAGNOSIS — E7800 Pure hypercholesterolemia, unspecified: Secondary | ICD-10-CM | POA: Diagnosis present

## 2019-01-29 DIAGNOSIS — F0280 Dementia in other diseases classified elsewhere without behavioral disturbance: Secondary | ICD-10-CM | POA: Diagnosis present

## 2019-01-29 DIAGNOSIS — Z66 Do not resuscitate: Secondary | ICD-10-CM | POA: Diagnosis present

## 2019-01-29 DIAGNOSIS — R52 Pain, unspecified: Secondary | ICD-10-CM | POA: Diagnosis not present

## 2019-01-29 LAB — BASIC METABOLIC PANEL
Anion gap: 11 (ref 5–15)
BUN: 15 mg/dL (ref 8–23)
CO2: 24 mmol/L (ref 22–32)
Calcium: 8.7 mg/dL — ABNORMAL LOW (ref 8.9–10.3)
Chloride: 98 mmol/L (ref 98–111)
Creatinine, Ser: 0.85 mg/dL (ref 0.61–1.24)
GFR calc Af Amer: >60 mL/min (ref >60)
GFR calc non Af Amer: >60 mL/min (ref >60)
Glucose, Bld: 109 mg/dL — ABNORMAL HIGH (ref 70–99)
Potassium: 4.6 mmol/L (ref 3.5–5.1)
Sodium: 133 mmol/L — ABNORMAL LOW (ref 135–145)

## 2019-01-29 LAB — CBC WITH DIFFERENTIAL/PLATELET
Abs Immature Granulocytes: 0.03 10*3/uL (ref 0.00–0.07)
Basophils Absolute: 0.1 10*3/uL (ref 0.0–0.1)
Basophils Relative: 1 %
Eosinophils Absolute: 0.5 10*3/uL (ref 0.0–0.5)
Eosinophils Relative: 5 %
HCT: 32.7 % — ABNORMAL LOW (ref 39.0–52.0)
Hemoglobin: 11.6 g/dL — ABNORMAL LOW (ref 13.0–17.0)
Immature Granulocytes: 0 %
Lymphocytes Relative: 20 %
Lymphs Abs: 1.9 10*3/uL (ref 0.7–4.0)
MCH: 32.2 pg (ref 26.0–34.0)
MCHC: 35.5 g/dL (ref 30.0–36.0)
MCV: 90.8 fL (ref 80.0–100.0)
Monocytes Absolute: 0.9 10*3/uL (ref 0.1–1.0)
Monocytes Relative: 10 %
Neutro Abs: 6.1 10*3/uL (ref 1.7–7.7)
Neutrophils Relative %: 64 %
Platelets: 248 10*3/uL (ref 150–400)
RBC: 3.6 MIL/uL — ABNORMAL LOW (ref 4.22–5.81)
RDW: 12 % (ref 11.5–15.5)
WBC: 9.4 10*3/uL (ref 4.0–10.5)
nRBC: 0 % (ref 0.0–0.2)

## 2019-01-29 LAB — LACTIC ACID, PLASMA: Lactic Acid, Venous: 1.2 mmol/L (ref 0.5–1.9)

## 2019-01-29 LAB — PHENYTOIN LEVEL, TOTAL: Phenytoin Lvl: 13.4 ug/mL (ref 10.0–20.0)

## 2019-01-29 LAB — ETHANOL: Alcohol, Ethyl (B): <10 mg/dL (ref <10)

## 2019-01-29 MED ORDER — SODIUM CHLORIDE 0.9 % IV BOLUS
500.0000 mL | Freq: Once | INTRAVENOUS | Status: AC
  Administered 2019-01-30: 500 mL via INTRAVENOUS

## 2019-01-29 NOTE — ED Provider Notes (Signed)
[facility] EMERGENCY DEPARTMENT Provider Note   CSN: [id number] Arrival date & time: 01/29/19  2018     History   Chief Complaint Chief Complaint  Patient presents with  . Seizures    HPI Cameron Potts is a 83 y.o. male.     Patient is an 83 year old male with past medical history of atrial fibrillation, prostate cancer, frontal lobe lesion, seizures.  He has brought from his extended care facility for evaluation of seizures.  He apparently had 2 brief seizures earlier on in the day, then this evening had a seizure that lasted for several minutes.  EMS was called and the patient was given 2 doses of Versed with resolution.  Patient is now somnolent and unable to answer questions.  According to the history I am given by EMS, patient has not had any changes in behavior or other symptoms recently.  The history is provided by the patient.  Seizures Seizure activity on arrival: no   Seizure type:  Grand mal Initial focality:  None Episode characteristics: generalized shaking and unresponsiveness   Postictal symptoms: somnolence   Return to baseline: no   Severity:  Moderate Duration:  10 minutes Timing:  Intermittent Number of seizures this episode:  3 Progression:  Resolved   Past Medical History:  Diagnosis Date  . Abnormality of gait 12/21/2013  . Atrial fibrillation ([place])   . Blind left eye   . Carcinoma in situ of prostate   . Cardiac arrest ([place])   . Glaucoma   . Hypercholesteremia   . Hyperlipidemia   . Metabolic encephalopathy   . Mild left ventricular systolic dysfunction   . Presence of other specified functional implants   . PSA elevation   . Right frontal lobe lesion   . Seizures ([place])   . Tinea pedis   . 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 Urinary urgency     Patient Active Problem List   Diagnosis Date Noted  . CAP (community acquired pneumonia) 11/03/2018  . Altered mental status 11/02/2018  . Hypercoagulability due to  atrial fibrillation ([place]) 03/25/2018  . Primary open-angle glaucoma, bilateral, severe stage 03/17/2018  . ICD (implantable cardioverter-defibrillator) in place 03/13/2018  . Parkinsonian syndrome associated with symptomatic orthostatic hypotension ([place]) 03/13/2018  . Refractory epilepsy ([place]) 03/13/2018  . Status post VNS (vagus nerve stimulator) placement 03/13/2018  . 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 Neuropathy, peripheral 12/14/2017  . Hyponatremia 07/12/2017  . Presence of intraocular lens 04/23/2017  . Status epilepticus ([place]) 01/05/2017  . Chronic atrial fibrillation 01/04/2017  . Adjustment disorder with anxious mood 03/25/2014  . Age-related nuclear cataract of both eyes 03/25/2014  . 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  . Hyperlipidemia 03/25/2014  . Carcinoma in situ of prostate 02/08/2014  . Other localized visual field defect, bilateral 02/02/2014  . Abnormality of gait 12/21/2013  . Complex partial seizure ([place]) 04/11/2013  . Seizure disorder ([place]) 04/10/2013  . Osteopenia 12/04/2011  . Vitamin D deficiency 12/04/2011    Past Surgical History:  Procedure Laterality Date  . CARDIAC DEFIBRILLATOR PLACEMENT    . cataract surgery    . COLONOSCOPY    . CRANIOTOMY Right    right anterior temporal resection  . defibrillator replaced  March 2016  . IMPLANTATION VAGAL NERVE STIMULATOR  June 2016  . NASAL SINUS SURGERY    . TONSILLECTOMY     age 73  . [place] Medications  Prior to Admission medications   Medication Sig Start Date End Date Taking? Authorizing Provider  acetaminophen (TYLENOL) 325 MG tablet Take 650 mg by mouth every 6 (six) hours as needed.    [provider]  ALPRAZolam Duanne Moron) 0.25 MG tablet Take 1 tablet (0.25 mg total) by mouth 2 (two) times daily. 01/01/19   Royal Hawthorn, NP  brimonidine-timolol (COMBIGAN) 0.2-0.5 % ophthalmic solution Place 1 drop into both eyes every 12 (twelve) hours.  01/05/18    [provider]  cholecalciferol (VITAMIN D) 400 units TABS tablet Take 400 Units by mouth 2 (two) times daily.    [provider]  finasteride (PROSCAR) 5 MG tablet Take 5 mg by mouth daily.     [provider]  flecainide (TAMBOCOR) 50 MG tablet Take 50 mg by mouth 2 (two) times daily.    [provider]  fluocinonide ointment (LIDEX) 7.25 % Apply 1 application topically 2 (two) times daily as needed (on rash /skin folds, not for face).     [provider]  ketoconazole (NIZORAL) 2 % shampoo Apply 1 application topically 2 (two) times a week.    [provider]  Lacosamide (VIMPAT) 150 MG TABS Take 1 tablet (150 mg total) by mouth 2 (two) times a day. 12/29/18   Kathrynn Ducking, MD  latanoprost (XALATAN) 0.005 % ophthalmic solution Place 1 drop into both eyes at bedtime.    [provider]  Levetiracetam (KEPPRA XR) 750 MG TB24 Take 4 tablets (3,000 mg total) by mouth at bedtime. 11/25/18   Kathrynn Ducking, MD  LORazepam (ATIVAN) 2 MG/ML injection Inject 2 mg into the muscle as needed for seizure. NOT TO BE USED FOR BEHAVIORS    [provider]  phenytoin (DILANTIN) 100 MG ER capsule 1 capsule in the morning, 3 capsules in the evening 12/24/18   Kathrynn Ducking, MD  phenytoin (DILANTIN) 30 MG ER capsule Take 1 capsule (30 mg total) by mouth every other day. 09/24/18   Kathrynn Ducking, MD  polyethylene glycol [facility] / Floria Raveling) packet Take 17 g by mouth every other day. And once daily as needed.    [provider]  sodium chloride 1 g tablet Take 1 g by mouth daily.    [provider]  triamcinolone cream (KENALOG) 0.1 % Apply 1 application topically 2 (two) times daily as needed (for flares).     [provider]  warfarin (COUMADIN) 2.5 MG tablet Take 1 tablet (2.5 mg total) by mouth daily at 12 noon. Patient taking differently: Take by mouth. 2.5 mg each day of week at 12 pm except on Monday take  2.75 mg at 12 pm 10/14/18   Gayland Curry, DO    Family History Family History  Problem Relation Age of Onset  . Cancer - Lung Mother   . Cancer - Prostate Father     Social History Social History   Tobacco Use  . Smoking status: Former Smoker    Packs/day: 0.75    Years: 10.00    Pack years: 7.50    Types: Cigarettes  . Smokeless tobacco: Never Used  . Tobacco comment: quit 1960s  Substance Use Topics  . Alcohol use: No    Comment: former beer drinker  . Drug use: No     Allergies   Depakote [divalproex sodium]   Review of Systems Review of Systems  Neurological: Positive for seizures.  All other systems reviewed and are negative.  Physical Exam Updated Vital Signs There were no vitals taken for this visit.  Physical Exam Vitals signs and nursing note reviewed.  Constitutional:      General: He is not in acute distress.    Appearance: He is well-developed. He is not diaphoretic.     Comments: Patient is somnolent with snoring respirations.  HENT:     Head: Normocephalic and atraumatic.  Neck:     Musculoskeletal: Normal range of motion and neck supple.  Cardiovascular:     Rate and Rhythm: Normal rate and regular rhythm.     Heart sounds: No murmur. No friction rub.  Pulmonary:     Effort: Pulmonary effort is normal. No respiratory distress.     Breath sounds: Normal breath sounds. No wheezing or rales.  Abdominal:     General: Bowel sounds are normal. There is no distension.     Palpations: Abdomen is soft.     Tenderness: There is no abdominal tenderness.  Musculoskeletal: Normal range of motion.  Skin:    General: Skin is warm and dry.  Neurological:     Mental Status: He is alert.     Coordination: Coordination normal.     Comments: Patient is somnolent with snoring respirations.  He will grunt and moan when stimulated with loud voice or noxious stimuli.  He does not follow commands.      ED Treatments / Results  Labs (all labs  ordered are listed, but only abnormal results are displayed) Labs Reviewed  BASIC METABOLIC PANEL  CBC WITH DIFFERENTIAL/PLATELET  ETHANOL  LACTIC ACID, PLASMA  LACTIC ACID, PLASMA    EKG None  Radiology No results found.  Procedures Procedures (including critical care time)  Medications Ordered in ED Medications  sodium chloride 0.9 % bolus 500 mL (has no administration in time range)     Initial Impression / Assessment and Plan / ED Course  I have reviewed the triage vital signs and the nursing notes.  Pertinent labs & imaging results that were available during my care of the patient were reviewed by me and considered in my medical decision making (see chart for details).  Patient presenting with complaints of seizure.  Patient has history of seizure disorder and is currently taking several medications.  He had 2 brief seizures earlier today, followed by a longer one this evening that required Versed by EMS.  Patient remains postictal.  His Dilantin level is therapeutic and work-up otherwise unremarkable.  Patient care discussed with Dr. Cheral Marker from neurology.  Patient will be admitted to the hospitalist service for medication adjustments and observation during his prolonged postictal phase.  CRITICAL CARE Performed by: Veryl Speak Total critical care time: 35 minutes Critical care time was exclusive of separately billable procedures and treating other patients. Critical care was necessary to treat or prevent imminent or life-threatening deterioration. Critical care was time spent personally by me on the following activities: development of treatment plan with patient and/or surrogate as well as nursing, discussions with consultants, evaluation of patient's response to treatment, examination of patient, obtaining history from patient or surrogate, ordering and performing treatments and interventions, ordering and review of laboratory studies, ordering and review of  radiographic studies, pulse oximetry and re-evaluation of patient's condition.   Final Clinical Impressions(s) / ED Diagnoses   Final diagnoses:  None    ED Discharge Orders    None       Veryl Speak, MD 02/01/19 [phone]

## 2019-01-29 NOTE — ED Notes (Signed)
[place] WIFE [phone]

## 2019-01-29 NOTE — ED Triage Notes (Signed)
Pt arrives via [facility] after c/o seizures. Per EMS pt from [facility] and has had multiple seizures. Pt arrives unresponsive. VSS en route.

## 2019-01-30 DIAGNOSIS — Z79899 Other long term (current) drug therapy: Secondary | ICD-10-CM | POA: Diagnosis not present

## 2019-01-30 DIAGNOSIS — Z7952 Long term (current) use of systemic steroids: Secondary | ICD-10-CM | POA: Diagnosis not present

## 2019-01-30 DIAGNOSIS — D6859 Other primary thrombophilia: Secondary | ICD-10-CM | POA: Diagnosis present

## 2019-01-30 DIAGNOSIS — Z7401 Bed confinement status: Secondary | ICD-10-CM | POA: Diagnosis not present

## 2019-01-30 DIAGNOSIS — I482 Chronic atrial fibrillation, unspecified: Secondary | ICD-10-CM | POA: Diagnosis not present

## 2019-01-30 DIAGNOSIS — G20 Parkinson's disease: Secondary | ICD-10-CM | POA: Diagnosis present

## 2019-01-30 DIAGNOSIS — Z9119 Patient's noncompliance with other medical treatment and regimen: Secondary | ICD-10-CM | POA: Diagnosis not present

## 2019-01-30 DIAGNOSIS — R0902 Hypoxemia: Secondary | ICD-10-CM | POA: Diagnosis not present

## 2019-01-30 DIAGNOSIS — I4891 Unspecified atrial fibrillation: Secondary | ICD-10-CM

## 2019-01-30 DIAGNOSIS — Z87891 Personal history of nicotine dependence: Secondary | ICD-10-CM | POA: Diagnosis not present

## 2019-01-30 DIAGNOSIS — D649 Anemia, unspecified: Secondary | ICD-10-CM | POA: Diagnosis present

## 2019-01-30 DIAGNOSIS — F05 Delirium due to known physiological condition: Secondary | ICD-10-CM | POA: Diagnosis present

## 2019-01-30 DIAGNOSIS — G40919 Epilepsy, unspecified, intractable, without status epilepticus: Secondary | ICD-10-CM | POA: Diagnosis present

## 2019-01-30 DIAGNOSIS — E871 Hypo-osmolality and hyponatremia: Secondary | ICD-10-CM

## 2019-01-30 DIAGNOSIS — F0280 Dementia in other diseases classified elsewhere without behavioral disturbance: Secondary | ICD-10-CM | POA: Diagnosis present

## 2019-01-30 DIAGNOSIS — D6869 Other thrombophilia: Secondary | ICD-10-CM | POA: Diagnosis not present

## 2019-01-30 DIAGNOSIS — Z20828 Contact with and (suspected) exposure to other viral communicable diseases: Secondary | ICD-10-CM | POA: Diagnosis present

## 2019-01-30 DIAGNOSIS — R5381 Other malaise: Secondary | ICD-10-CM | POA: Diagnosis present

## 2019-01-30 DIAGNOSIS — E7800 Pure hypercholesterolemia, unspecified: Secondary | ICD-10-CM | POA: Diagnosis present

## 2019-01-30 DIAGNOSIS — N400 Enlarged prostate without lower urinary tract symptoms: Secondary | ICD-10-CM | POA: Diagnosis present

## 2019-01-30 DIAGNOSIS — Z95810 Presence of automatic (implantable) cardiac defibrillator: Secondary | ICD-10-CM | POA: Diagnosis not present

## 2019-01-30 DIAGNOSIS — M2550 Pain in unspecified joint: Secondary | ICD-10-CM | POA: Diagnosis not present

## 2019-01-30 DIAGNOSIS — Z66 Do not resuscitate: Secondary | ICD-10-CM | POA: Diagnosis present

## 2019-01-30 DIAGNOSIS — E785 Hyperlipidemia, unspecified: Secondary | ICD-10-CM | POA: Diagnosis present

## 2019-01-30 DIAGNOSIS — Z7901 Long term (current) use of anticoagulants: Secondary | ICD-10-CM | POA: Diagnosis not present

## 2019-01-30 DIAGNOSIS — R569 Unspecified convulsions: Secondary | ICD-10-CM | POA: Diagnosis not present

## 2019-01-30 DIAGNOSIS — Z8546 Personal history of malignant neoplasm of prostate: Secondary | ICD-10-CM | POA: Diagnosis not present

## 2019-01-30 LAB — CBC
HCT: 31.2 % — ABNORMAL LOW (ref 39.0–52.0)
Hemoglobin: 11.2 g/dL — ABNORMAL LOW (ref 13.0–17.0)
MCH: 32.3 pg (ref 26.0–34.0)
MCHC: 35.9 g/dL (ref 30.0–36.0)
MCV: 89.9 fL (ref 80.0–100.0)
Platelets: 238 10*3/uL (ref 150–400)
RBC: 3.47 MIL/uL — ABNORMAL LOW (ref 4.22–5.81)
RDW: 12 % (ref 11.5–15.5)
WBC: 6.3 10*3/uL (ref 4.0–10.5)
nRBC: 0 % (ref 0.0–0.2)

## 2019-01-30 LAB — BASIC METABOLIC PANEL
Anion gap: 9 (ref 5–15)
BUN: 11 mg/dL (ref 8–23)
CO2: 22 mmol/L (ref 22–32)
Calcium: 8.5 mg/dL — ABNORMAL LOW (ref 8.9–10.3)
Chloride: 101 mmol/L (ref 98–111)
Creatinine, Ser: 0.65 mg/dL (ref 0.61–1.24)
GFR calc Af Amer: >60 mL/min (ref >60)
GFR calc non Af Amer: >60 mL/min (ref >60)
Glucose, Bld: 104 mg/dL — ABNORMAL HIGH (ref 70–99)
Potassium: 4.1 mmol/L (ref 3.5–5.1)
Sodium: 132 mmol/L — ABNORMAL LOW (ref 135–145)

## 2019-01-30 LAB — PROTIME-INR
INR: 3.4 — ABNORMAL HIGH (ref 0.8–1.2)
Prothrombin Time: 33.6 seconds — ABNORMAL HIGH (ref 11.4–15.2)

## 2019-01-30 LAB — MRSA PCR SCREENING: MRSA by PCR: NEGATIVE

## 2019-01-30 LAB — SARS CORONAVIRUS 2 BY RT PCR (HOSPITAL ORDER, PERFORMED IN ~~LOC~~ HOSPITAL LAB): SARS Coronavirus 2: NEGATIVE

## 2019-01-30 LAB — GLUCOSE, CAPILLARY: Glucose-Capillary: 90 mg/dL (ref 70–99)

## 2019-01-30 LAB — MAGNESIUM: Magnesium: 1.9 mg/dL (ref 1.7–2.4)

## 2019-01-30 MED ORDER — BRIMONIDINE TARTRATE 0.2 % OP SOLN
1.0000 [drp] | Freq: Two times a day (BID) | OPHTHALMIC | Status: DC
  Administered 2019-01-30 – 2019-02-02 (×7): 1 [drp] via OPHTHALMIC
  Filled 2019-01-30: qty 5

## 2019-01-30 MED ORDER — ONDANSETRON HCL 4 MG PO TABS: 4.0000 mg | ORAL_TABLET | Freq: Four times a day (QID) | ORAL | Status: DC | PRN

## 2019-01-30 MED ORDER — WARFARIN - PHARMACIST DOSING INPATIENT: Freq: Every day | Status: DC

## 2019-01-30 MED ORDER — ONDANSETRON HCL 4 MG/2ML IJ SOLN: 4.0000 mg | Freq: Four times a day (QID) | INTRAMUSCULAR | Status: DC | PRN

## 2019-01-30 MED ORDER — TIMOLOL MALEATE 0.5 % OP SOLN
1.0000 [drp] | Freq: Two times a day (BID) | OPHTHALMIC | Status: DC
  Administered 2019-01-30 – 2019-02-02 (×6): 1 [drp] via OPHTHALMIC
  Filled 2019-01-30: qty 5

## 2019-01-30 MED ORDER — SODIUM CHLORIDE 0.9 % IV SOLN
150.0000 mg | Freq: Once | INTRAVENOUS | Status: AC
  Administered 2019-01-30: 150 mg via INTRAVENOUS
  Filled 2019-01-30: qty 3

## 2019-01-30 MED ORDER — LEVETIRACETAM IN NACL 1500 MG/100ML IV SOLN
1500.0000 mg | Freq: Once | INTRAVENOUS | Status: AC
  Administered 2019-01-30: 1500 mg via INTRAVENOUS
  Filled 2019-01-30: qty 100

## 2019-01-30 MED ORDER — PHENYTOIN SODIUM EXTENDED 100 MG PO CAPS
300.0000 mg | ORAL_CAPSULE | Freq: Every day | ORAL | Status: DC
  Administered 2019-01-30 – 2019-02-01 (×3): 300 mg via ORAL
  Filled 2019-01-30 (×4): qty 3

## 2019-01-30 MED ORDER — ACETAMINOPHEN 650 MG RE SUPP: 650.0000 mg | Freq: Four times a day (QID) | RECTAL | Status: DC | PRN

## 2019-01-30 MED ORDER — ALPRAZOLAM 0.25 MG PO TABS
0.2500 mg | ORAL_TABLET | Freq: Two times a day (BID) | ORAL | Status: DC
  Administered 2019-01-30 – 2019-02-02 (×7): 0.25 mg via ORAL
  Filled 2019-01-30 (×7): qty 1

## 2019-01-30 MED ORDER — SODIUM CHLORIDE 0.9% FLUSH
3.0000 mL | Freq: Two times a day (BID) | INTRAVENOUS | Status: DC
  Administered 2019-01-30 – 2019-02-02 (×6): 3 mL via INTRAVENOUS

## 2019-01-30 MED ORDER — LACOSAMIDE 50 MG PO TABS
150.0000 mg | ORAL_TABLET | Freq: Two times a day (BID) | ORAL | Status: DC
  Administered 2019-01-31 – 2019-02-02 (×5): 150 mg via ORAL
  Filled 2019-01-30 (×5): qty 3

## 2019-01-30 MED ORDER — LATANOPROST 0.005 % OP SOLN
1.0000 [drp] | Freq: Every day | OPHTHALMIC | Status: DC
  Administered 2019-01-30 – 2019-02-01 (×3): 1 [drp] via OPHTHALMIC
  Filled 2019-01-30: qty 2.5

## 2019-01-30 MED ORDER — FINASTERIDE 5 MG PO TABS
5.0000 mg | ORAL_TABLET | Freq: Every day | ORAL | Status: DC
  Administered 2019-01-30 – 2019-02-02 (×4): 5 mg via ORAL
  Filled 2019-01-30 (×4): qty 1

## 2019-01-30 MED ORDER — BRIMONIDINE TARTRATE-TIMOLOL 0.2-0.5 % OP SOLN: 1.0000 [drp] | Freq: Two times a day (BID) | OPHTHALMIC | Status: DC

## 2019-01-30 MED ORDER — FLECAINIDE ACETATE 50 MG PO TABS
50.0000 mg | ORAL_TABLET | Freq: Two times a day (BID) | ORAL | Status: DC
  Administered 2019-01-30 – 2019-02-02 (×7): 50 mg via ORAL
  Filled 2019-01-30 (×8): qty 1

## 2019-01-30 MED ORDER — ACETAMINOPHEN 325 MG PO TABS: 650.0000 mg | ORAL_TABLET | Freq: Four times a day (QID) | ORAL | Status: DC | PRN

## 2019-01-30 MED ORDER — TAMSULOSIN HCL 0.4 MG PO CAPS
0.4000 mg | ORAL_CAPSULE | Freq: Every day | ORAL | Status: DC
  Administered 2019-01-30 – 2019-02-02 (×4): 0.4 mg via ORAL
  Filled 2019-01-30 (×4): qty 1

## 2019-01-30 MED ORDER — SODIUM CHLORIDE 0.9 % IV SOLN
INTRAVENOUS | Status: AC
  Administered 2019-01-30: 03:00:00 via INTRAVENOUS

## 2019-01-30 MED ORDER — SODIUM CHLORIDE 0.9 % IV SOLN
200.0000 mg | Freq: Once | INTRAVENOUS | Status: AC
  Administered 2019-01-30: 200 mg via INTRAVENOUS
  Filled 2019-01-30: qty 20

## 2019-01-30 MED ORDER — PHENYTOIN SODIUM EXTENDED 100 MG PO CAPS: 100.0000 mg | ORAL_CAPSULE | ORAL | Status: DC

## 2019-01-30 MED ORDER — PHENYTOIN SODIUM EXTENDED 100 MG PO CAPS
100.0000 mg | ORAL_CAPSULE | Freq: Every day | ORAL | Status: DC
  Administered 2019-01-30 – 2019-02-02 (×4): 100 mg via ORAL
  Filled 2019-01-30 (×7): qty 1

## 2019-01-30 MED ORDER — LEVETIRACETAM ER 500 MG PO TB24
3000.0000 mg | ORAL_TABLET | Freq: Every day | ORAL | Status: DC
  Administered 2019-01-30 – 2019-02-01 (×3): 3000 mg via ORAL
  Filled 2019-01-30 (×4): qty 6

## 2019-01-30 NOTE — Progress Notes (Addendum)
ANTICOAGULATION CONSULT NOTE - Initial Consult  Pharmacy Consult for Warfarin  Indication: atrial fibrillation  Allergies  Allergen Reactions  . Depakote [Divalproex Sodium] Other (See Comments)    Arthralgias      Vital Signs: Temp: 97.7 F (36.5 C) (08/15 0830) Temp Source: Oral (08/15 0830) BP: 116/46 (08/15 0830) Pulse Rate: 60 (08/15 0830)  Labs: Recent Labs    01/29/19 2218 01/30/19 0211 01/30/19 0638  HGB 11.6*  --  11.2*  HCT 32.7*  --  31.2*  PLT 248  --  238  LABPROT  --  33.6*  --   INR  --  3.4*  --   CREATININE 0.85  --  0.65    Estimated Creatinine Clearance: 72.4 mL/min (by C-G formula based on SCr of 0.65 mg/dL).   Medical History: Past Medical History:  Diagnosis Date  . Abnormality of gait 12/21/2013  . Atrial fibrillation ([place])   . Blind left eye   . Carcinoma in situ of prostate   . Cardiac arrest ([place])   . Glaucoma   . Hypercholesteremia   . Hyperlipidemia   . Metabolic encephalopathy   . Mild left ventricular systolic dysfunction   . Presence of other specified functional implants   . PSA elevation   . Right frontal lobe lesion   . Seizures ([place])   . Tinea pedis   . 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 Urinary urgency     Assessment: 83 y/o M from nursing facility with seizures, on warfarin PTA for afib, continuing warfarin  INR this am 3.4 which is supratherapeutic.  Home warfarin regimen: 2.5 mg each day of week at 12 pm except on Monday take 2.75 mg at 12 pm  Goal of Therapy:  INR 2-3 Monitor platelets by anticoagulation protocol: Yes   Plan:  Will hold warfarin dose today -Will reassess INR with AM labs tomorrow   Alanda Slim, PharmD, [facility] Clinical Pharmacist Please see AMION for all Pharmacists' Contact Phone Numbers 01/30/2019, 8:37 AM

## 2019-01-30 NOTE — H&P (Signed)
History and Physical    Cameron Potts VXB:[id number] DOB: Aug 02, 1935 DOA: 01/29/2019  PCP: Gayland Curry, DO   Patient coming from: Extended care facility   Chief Complaint: Seizures   HPI: Cameron Potts is a 83 y.o. male with medical history significant for seizure disorder on 3 antiepileptics and status post VNS and atrial fibrillation on warfarin, presenting to the emergency department after a series of seizures.  Patient reportedly had 2 brief seizures on 01/29/2019 at his residence, and was then noted to have a prolonged seizure that prompted EMS to be called out.  2 doses of Versed were administered by EMS and the patient was brought into the ED.  There was no fall or trauma reported leading up to this and there is no report of recent illness or complaints.  Patient is somnolent on arrival and unable to contribute much to the history.  ED Course: Upon arrival to the ED, patient is found to be afebrile, saturating well on room air, and with stable blood pressure.  Noncontrast head CT is negative for acute findings but notable for old lesions.  Chemistry panel features a mild hyponatremia and CBC notable for normocytic anemia.  Phenytoin level is therapeutic and ethanol undetectable.  ED physician discussed the case with neurology who recommended administering his seizure meds parenterally tonight and having the patient observed on the hospitalist service to ensure no further seizures.  Review of Systems:  All other systems reviewed and apart from HPI, are negative.  Past Medical History:  Diagnosis Date   Abnormality of gait 12/21/2013   Atrial fibrillation ([facility])    Blind left eye    Carcinoma in situ of prostate    Cardiac arrest ([place])    Glaucoma    Hypercholesteremia    Hyperlipidemia    Metabolic encephalopathy    Mild left ventricular systolic dysfunction    Presence of other specified functional implants    PSA elevation    Right frontal lobe lesion    Seizures ([facility])     Tinea pedis    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[place])    Urinary urgency     Past Surgical History:  Procedure Laterality Date   CARDIAC DEFIBRILLATOR PLACEMENT     cataract surgery     COLONOSCOPY     CRANIOTOMY Right    right anterior temporal resection   defibrillator replaced  March 2016   IMPLANTATION VAGAL NERVE STIMULATOR  June 2016   NASAL SINUS SURGERY     TONSILLECTOMY     age 67   [place]     reports that he has quit smoking. His smoking use included cigarettes. He has a 7.50 pack-year smoking history. He has never used smokeless tobacco. He reports that he does not drink alcohol or use drugs.  Allergies  Allergen Reactions   Depakote [Divalproex Sodium] Other (See Comments)    Arthralgias     Family History  Problem Relation Age of Onset   Cancer - Lung Mother    Cancer - Prostate Father      Prior to Admission medications   Medication Sig Start Date End Date Taking? Authorizing Provider  acetaminophen (TYLENOL) 325 MG tablet Take 650 mg by mouth every 6 (six) hours as needed for mild pain or fever.    Yes [provider]  ALPRAZolam (XANAX) 0.25 MG tablet Take 1 tablet (0.25 mg total) by mouth 2 (two) times daily. 01/01/19  Yes Wert, Margreta Journey, NP  brimonidine-timolol (COMBIGAN) 0.2-0.5 % ophthalmic  solution Place 1 drop into both eyes every 12 (twelve) hours.  01/05/18  Yes [provider]  cholecalciferol (VITAMIN D) 400 units TABS tablet Take 400 Units by mouth 2 (two) times daily.   Yes [provider]  finasteride (PROSCAR) 5 MG tablet Take 5 mg by mouth daily.    Yes [provider]  flecainide (TAMBOCOR) 50 MG tablet Take 50 mg by mouth 2 (two) times daily.   Yes [provider]  fluocinonide ointment (LIDEX) 9.35 % Apply 1 application topically 2 (two) times daily as needed (on rash /skin folds, not for face).    Yes [provider]  ketoconazole (NIZORAL) 2 % shampoo  Apply 1 application topically 2 (two) times a week.   Yes [provider]  Lacosamide (VIMPAT) 150 MG TABS Take 1 tablet (150 mg total) by mouth 2 (two) times a day. 12/29/18  Yes Kathrynn Ducking, MD  latanoprost (XALATAN) 0.005 % ophthalmic solution Place 1 drop into both eyes at bedtime.   Yes [provider]  Levetiracetam (KEPPRA XR) 750 MG TB24 Take 4 tablets (3,000 mg total) by mouth at bedtime. 11/25/18  Yes Kathrynn Ducking, MD  LORazepam (ATIVAN) 2 MG/ML injection Inject 2 mg into the muscle daily as needed for seizure. NOT TO BE USED FOR BEHAVIORS    Yes [provider]  phenytoin (DILANTIN) 100 MG ER capsule 1 capsule in the morning, 3 capsules in the evening 12/24/18  Yes Kathrynn Ducking, MD  polyethylene glycol [facility] / GLYCOLAX) packet Take 17 g by mouth See admin instructions. Once daily every other day and as needed for constipation   Yes [provider]  sodium chloride 1 g tablet Take 1 g by mouth daily.   Yes [provider]  tamsulosin (FLOMAX) 0.4 MG CAPS capsule Take 0.4 mg by mouth every morning.   Yes [provider]  triamcinolone cream (KENALOG) 0.1 % Apply 1 application topically 2 (two) times daily as needed (for flares).    Yes [provider]  warfarin (COUMADIN) 2.5 MG tablet Take 1 tablet (2.5 mg total) by mouth daily at 12 noon. Patient taking differently: Take by mouth. 2.5 mg each day of week at 12 pm except on Monday take 2.75 mg at 12 pm 10/14/18  Yes Gayland Curry, DO    Physical Exam: Vitals:   01/29/19 2315 01/29/19 2330 01/29/19 2345 01/30/19 0000  BP: (!) 122/59 (!) 130/57 (!) 131/56 131/70  Pulse:      Resp: 18 19 13 17   Temp:      TempSrc:      SpO2:        Constitutional: NAD, somnolent  Eyes: PERTLA, lids and conjunctivae normal ENMT: Mucous membranes are moist. Posterior pharynx clear of any exudate or lesions.   Neck: normal, supple, no masses, no thyromegaly Respiratory: no  wheezing, no crackles. Normal respiratory effort. No accessory muscle use.  Cardiovascular: S1 & S2 heard, regular rate and rhythm. No extremity edema.   Abdomen: No distension, no tenderness, soft. Bowel sounds active.  Musculoskeletal: no clubbing / cyanosis. No joint deformity upper and lower extremities.   Skin: no significant rashes, lesions, ulcers. Warm, dry, well-perfused. Neurologic: No gross facial asymmetry. Sensation intact. Moving all 4 extremities.  Psychiatric: Somnolent, wakes briefly and follows commands, begins to answer questions but falls asleep mid-sentence.     Labs on Admission: I have personally reviewed following labs and imaging studies  CBC: Recent Labs  Lab 01/29/19 2218  WBC 9.4  NEUTROABS 6.1  HGB 11.6*  HCT 32.7*  MCV 90.8  PLT 660   Basic Metabolic Panel: Recent Labs  Lab 01/29/19 0012 01/29/19 2218  NA  --  133*  K  --  4.6  CL  --  98  CO2  --  24  GLUCOSE  --  109*  BUN  --  15  CREATININE  --  0.85  CALCIUM  --  8.7*  MG 1.9  --    GFR: Estimated Creatinine Clearance: 68.2 mL/min (by C-G formula based on SCr of 0.85 mg/dL). Liver Function Tests: No results for input(s): AST, ALT, ALKPHOS, BILITOT, PROT, ALBUMIN in the last 168 hours. No results for input(s): LIPASE, AMYLASE in the last 168 hours. No results for input(s): AMMONIA in the last 168 hours. Coagulation Profile: No results for input(s): INR, PROTIME in the last 168 hours. Cardiac Enzymes: No results for input(s): CKTOTAL, CKMB, CKMBINDEX, TROPONINI in the last 168 hours. BNP (last 3 results) No results for input(s): PROBNP in the last 8760 hours. HbA1C: No results for input(s): HGBA1C in the last 72 hours. CBG: No results for input(s): GLUCAP in the last 168 hours. Lipid Profile: No results for input(s): CHOL, HDL, LDLCALC, TRIG, CHOLHDL, LDLDIRECT in the last 72 hours. Thyroid Function Tests: No results for input(s): TSH, T4TOTAL, FREET4, T3FREE, THYROIDAB in the  last 72 hours. Anemia Panel: No results for input(s): VITAMINB12, FOLATE, FERRITIN, TIBC, IRON, RETICCTPCT in the last 72 hours. Urine analysis:    Component Value Date/Time   COLORURINE YELLOW 11/02/2018 1647   APPEARANCEUR HAZY (A) 11/02/2018 1647   LABSPEC 1.018 11/02/2018 1647   PHURINE 5.0 11/02/2018 1647   GLUCOSEU NEGATIVE 11/02/2018 1647   HGBUR MODERATE (A) 11/02/2018 1647   BILIRUBINUR NEGATIVE 11/02/2018 1647   KETONESUR 20 (A) 11/02/2018 1647   PROTEINUR NEGATIVE 11/02/2018 1647   UROBILINOGEN 0.2 04/10/2013 0910   NITRITE NEGATIVE 11/02/2018 1647   LEUKOCYTESUR NEGATIVE 11/02/2018 1647   Sepsis Labs: @LABRCNTIP (procalcitonin:4,lacticidven:4) )No results found for this or any previous visit (from the past 240 hour(s)).   Radiological Exams on Admission: Ct Head Wo Contrast  Result Date: 01/29/2019 CLINICAL DATA:  Altered level consciousness EXAM: CT HEAD WITHOUT CONTRAST TECHNIQUE: Contiguous axial images were obtained from the base of the skull through the vertex without intravenous contrast. COMPARISON:  Nov 02, 2018 FINDINGS: Brain: No evidence of acute territorial infarction, hemorrhage, hydrocephalus,extra-axial collection or mass lesion/mass effect. There is dilatation the ventricles and sulci consistent with age-related atrophy. Low-attenuation changes in the deep white matter consistent with small vessel ischemia. There is a large area of encephalomalacia involving the right temporal lobe. There is also lacunar infarct involving the right posterior frontal lobe as on the prior exam. Vascular: No hyperdense vessel or unexpected calcification. Calcifications are seen within the internal carotid artery and vertebral arteries. Skull: The skull is intact. No fracture or focal lesion identified. Sinuses/Orbits: Bilateral maxillary mucosal sinus thickening is seen. There is thickening of the ethmoid air cells. The mastoid air cells are clear. Other: None IMPRESSION: 1. No acute  intracranial abnormality. 2. Findings consistent with age related atrophy and chronic small vessel ischemia 3. Encephalomalacia involving the right temporal lobe and prior lacunar infarct involving the right frontal lobe. 4. 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 Electronically Signed   By: Prudencio Pair M.D.   On: 01/29/2019 23:02    EKG: Not performed.   Assessment/Plan   1. Breakthrough seizures   -  Presents after 3 seizures, last of which was prolonged and aborted with Versed x2 with EMS  - He remains quite somnolent in ED but is slowly waking and beginning to answer questions and follow commands  - He was given his evening doses of Vimpat, Keppra, and Dilantin IV in ED and neurology recommended observing overnight and resuming oral antiepileptics in am if he continues to improve   - Continue neuro checks, seizure precautions, resume oral antiepileptics in am as tolerated   2. Atrial fibrillation  - Check INR and continue warfarin with pharmacy assistance, continue flecainide    3. Hyponatremia  - Serum sodium is 133, up from 126 on 01/20/19  - He was given 500 cc NS bolus and infusion in ED  - Taking salt tabs at home, will hold pending repeat chem panel in am    PPE: Mask, face shield  DVT prophylaxis: warfarin  Code Status: DNR  Family Communication: Discussed with patient  Consults called: None  Admission status: Observation     Vianne Bulls, MD [facility] Pager [phone]  If 7PM-7AM, please contact night-coverage [email or web address] Password [email or web address]  01/30/2019, 1:14 AM

## 2019-01-30 NOTE — Progress Notes (Signed)
ANTICOAGULATION CONSULT NOTE - Initial Consult  Pharmacy Consult for Warfarin  Indication: atrial fibrillation  Allergies  Allergen Reactions  . Depakote [Divalproex Sodium] Other (See Comments)    Arthralgias      Vital Signs: Temp: 98.3 F (36.8 C) (08/14 2040) Temp Source: Rectal (08/14 2040) BP: 131/70 (08/15 0000) Pulse Rate: 70 (08/14 2300)  Labs: Recent Labs    01/29/19 2218  HGB 11.6*  HCT 32.7*  PLT 248  CREATININE 0.85    Estimated Creatinine Clearance: 68.2 mL/min (by C-G formula based on SCr of 0.85 mg/dL).   Medical History: Past Medical History:  Diagnosis Date  . Abnormality of gait 12/21/2013  . Atrial fibrillation ([place])   . Blind left eye   . Carcinoma in situ of prostate   . Cardiac arrest ([place])   . Glaucoma   . Hypercholesteremia   . Hyperlipidemia   . Metabolic encephalopathy   . Mild left ventricular systolic dysfunction   . Presence of other specified functional implants   . PSA elevation   . Right frontal lobe lesion   . Seizures ([place])   . Tinea pedis   . 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 Urinary urgency     Assessment: 83 y/o M from nursing facility with seizures, on warfarin PTA for afib, continuing warfarin, no INR drawn yet  Goal of Therapy:  INR 2-3 Monitor platelets by anticoagulation protocol: Yes   Plan:  -INR with AM labs to assess dosing needs  Narda Bonds 01/30/2019,1:47 AM

## 2019-01-30 NOTE — Progress Notes (Signed)
PROGRESS NOTE    [place] Potts  PJK:[id number] DOB: 28-Aug-1935 DOA: 01/29/2019 PCP: Gayland Curry, DO    Brief Narrative: 83 year old male with a past medical history of seizure disorder on 3 antiepileptic medications, s/p VNS, atrial fibrillation on Coumadin presenting to the emergency department with complaints of multiple episodes of seizures on 01/29/2019 at his residence, also noted to have a prolonged seizure that prompted EMS to call, and brought to the emergency department.  Patient received 2 doses of Versed.  Work-up in the emergency department CT without contrast was negative for acute findings, notable for old encephalomalacia.  Lab work-up revealed mild hyponatremia, anemia.  Phenytoin level is therapeutic.  Neurology is consulted, recommended to continue seizure medications parenterally.  Patient did not have any episodes of seizures after the admission.  Patient has dysarthria, is able to communicate.  ##Seizures recurrent -Continue with phenytoin, Keppra, Vimpat -Continue with seizure precautions  ##Hyponatremia -Patient has chronic hyponatremia had a sodium level of 126 about a week back, has been on salt tablets -Sodium improved to 134. -Continue with the salt tablets  ##Debility -Involved physical therapy  ##Benign prostatic hypertrophy -Continue with Flomax  ##Atrial fibrillation-continue the flecainide -Hold Coumadin as patient's INR is 3.4   Assessment & Plan:   Principal Problem:   Breakthrough seizure (HCC) Active Problems:   Chronic atrial fibrillation   Parkinsonian syndrome associated with symptomatic orthostatic hypotension (HCC)   Hyponatremia   Hypercoagulability due to atrial fibrillation (HCC)    DVT prophylaxis: anticoagulated Code Status: (Full Family Communication: Wife over the phone Disposition Plan: Based on physical therapy evaluation   Consultants:   Neurology    Subjective: Denied having any shortness of breath,  headache.  Objective: Vitals:   01/30/19 0534 01/30/19 0625 01/30/19 0830 01/30/19 1137  BP: (!) 140/52  (!) 116/46 (!) 132/48  Pulse: 62  60 60  Resp: 17  (!) 22 14  Temp:  (!) 97.5 F (36.4 C) 97.7 F (36.5 C) 97.7 F (36.5 C)  TempSrc:  Oral Oral Oral  SpO2: 99%  99% 100%   No intake or output data in the 24 hours ending 01/30/19 1312 There were no vitals filed for this visit.  Examination:  General exam: Appears calm and comfortable  Respiratory system: Clear to auscultation. Respiratory effort normal. Cardiovascular system: S1 & S2 heard, RRR. No JVD, murmurs, rubs, gallops or clicks. No pedal edema. Gastrointestinal system: Abdomen is nondistended, soft and nontender. No organomegaly or masses felt. Normal bowel sounds heard. Central nervous system: Alert and oriented. No focal neurological deficits. Extremities: Symmetric 5 x 5 power. Skin: No rashes, lesions or ulcers Psychiatry: Judgement and insight appear normal. Mood & affect appropriate.     Data Reviewed: I have personally reviewed following labs and imaging studies  CBC: Recent Labs  Lab 01/29/19 2218 01/30/19 0638  WBC 9.4 6.3  NEUTROABS 6.1  --   HGB 11.6* 11.2*  HCT 32.7* 31.2*  MCV 90.8 89.9  PLT 248 809   Basic Metabolic Panel: Recent Labs  Lab 01/29/19 0012 01/29/19 2218 01/30/19 0638  NA  --  133* 132*  K  --  4.6 4.1  CL  --  98 101  CO2  --  24 22  GLUCOSE  --  109* 104*  BUN  --  15 11  CREATININE  --  0.85 0.65  CALCIUM  --  8.7* 8.5*  MG 1.9  --   --    GFR: Estimated Creatinine Clearance: 72.4  mL/min (by C-G formula based on SCr of 0.65 mg/dL). Liver Function Tests: No results for input(s): AST, ALT, ALKPHOS, BILITOT, PROT, ALBUMIN in the last 168 hours. No results for input(s): LIPASE, AMYLASE in the last 168 hours. No results for input(s): AMMONIA in the last 168 hours. Coagulation Profile: Recent Labs  Lab 01/30/19 0211  INR 3.4*   Cardiac Enzymes: No results for  input(s): CKTOTAL, CKMB, CKMBINDEX, TROPONINI in the last 168 hours. BNP (last 3 results) No results for input(s): PROBNP in the last 8760 hours. HbA1C: No results for input(s): HGBA1C in the last 72 hours. CBG: Recent Labs  Lab 01/30/19 0829  GLUCAP 90   Lipid Profile: No results for input(s): CHOL, HDL, LDLCALC, TRIG, CHOLHDL, LDLDIRECT in the last 72 hours. Thyroid Function Tests: No results for input(s): TSH, T4TOTAL, FREET4, T3FREE, THYROIDAB in the last 72 hours. Anemia Panel: No results for input(s): VITAMINB12, FOLATE, FERRITIN, TIBC, IRON, RETICCTPCT in the last 72 hours. Sepsis Labs: Recent Labs  Lab 01/29/19 2219  LATICACIDVEN 1.2    Recent Results (from the past 240 hour(s))  SARS Coronavirus 2 [facility] order, Performed in [facility] hospital lab) Nasopharyngeal Nasopharyngeal Swab     Status: None   Collection Time: 01/30/19  4:54 AM   Specimen: Nasopharyngeal Swab  Result Value Ref Range Status   SARS Coronavirus 2 NEGATIVE NEGATIVE Final    Comment: (NOTE) If result is NEGATIVE SARS-CoV-2 target nucleic acids are NOT DETECTED. The SARS-CoV-2 RNA is generally detectable in upper and lower  respiratory specimens during the acute phase of infection. The lowest  concentration of SARS-CoV-2 viral copies this assay can detect is 250  copies / mL. A negative result does not preclude SARS-CoV-2 infection  and should not be used as the sole basis for treatment or other  patient management decisions.  A negative result may occur with  improper specimen collection / handling, submission of specimen other  than nasopharyngeal swab, presence of viral mutation(s) within the  areas targeted by this assay, and inadequate number of viral copies  (<250 copies / mL). A negative result must be combined with clinical  observations, patient history, and epidemiological information. If result is POSITIVE SARS-CoV-2 target nucleic acids are DETECTED. The SARS-CoV-2 RNA is  generally detectable in upper and lower  respiratory specimens dur ing the acute phase of infection.  Positive  results are indicative of active infection with SARS-CoV-2.  Clinical  correlation with patient history and other diagnostic information is  necessary to determine patient infection status.  Positive results do  not rule out bacterial infection or co-infection with other viruses. If result is PRESUMPTIVE POSTIVE SARS-CoV-2 nucleic acids MAY BE PRESENT.   A presumptive positive result was obtained on the submitted specimen  and confirmed on repeat testing.  While 2019 novel coronavirus  (SARS-CoV-2) nucleic acids may be present in the submitted sample  additional confirmatory testing may be necessary for epidemiological  and / or clinical management purposes  to differentiate between  SARS-CoV-2 and other Sarbecovirus currently known to infect humans.  If clinically indicated additional testing with an alternate test  methodology [phone]) is advised. The SARS-CoV-2 RNA is generally  detectable in upper and lower respiratory sp ecimens during the acute  phase of infection. The expected result is Negative. Fact Sheet for Patients:  StrictlyIdeas.no Fact Sheet for Healthcare Providers: BankingDealers.co.za This test is not yet approved or cleared by the [place] FDA and has been authorized for detection and/or diagnosis of SARS-CoV-2  by FDA under an Emergency Use Authorization (EUA).  This EUA will remain in effect (meaning this test can be used) for the duration of the COVID-19 declaration under Section 564(b)(1) of the Act, 21 U.S.C. section 360bbb-3(b)(1), unless the authorization is terminated or revoked sooner. Performed at [facility], [place] [address], [place], [place] [address]   MRSA PCR Screening     Status: None   Collection Time: 01/30/19  5:55 AM   Specimen: Nasal Mucosa; Nasopharyngeal  Result Value Ref  Range Status   MRSA by PCR NEGATIVE NEGATIVE Final    Comment:        The GeneXpert MRSA Assay (FDA approved for NASAL specimens only), is one component of a comprehensive MRSA colonization surveillance program. It is not intended to diagnose MRSA infection nor to guide or monitor treatment for MRSA infections. Performed at [facility], [place] [address], [place], [place] [address]          Radiology Studies: Ct Head Wo Contrast  Result Date: 01/29/2019 CLINICAL DATA:  Altered level consciousness EXAM: CT HEAD WITHOUT CONTRAST TECHNIQUE: Contiguous axial images were obtained from the base of the skull through the vertex without intravenous contrast. COMPARISON:  Nov 02, 2018 FINDINGS: Brain: No evidence of acute territorial infarction, hemorrhage, hydrocephalus,extra-axial collection or mass lesion/mass effect. There is dilatation the ventricles and sulci consistent with age-related atrophy. Low-attenuation changes in the deep white matter consistent with small vessel ischemia. There is a large area of encephalomalacia involving the right temporal lobe. There is also lacunar infarct involving the right posterior frontal lobe as on the prior exam. Vascular: No hyperdense vessel or unexpected calcification. Calcifications are seen within the internal carotid artery and vertebral arteries. Skull: The skull is intact. No fracture or focal lesion identified. Sinuses/Orbits: Bilateral maxillary mucosal sinus thickening is seen. There is thickening of the ethmoid air cells. The mastoid air cells are clear. Other: None IMPRESSION: 1. No acute intracranial abnormality. 2. Findings consistent with age related atrophy and chronic small vessel ischemia 3. Encephalomalacia involving the right temporal lobe and prior lacunar infarct involving the right frontal lobe. 4. 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 Electronically Signed   By: Prudencio Pair M.D.   On: 01/29/2019 23:02         Scheduled Meds:  ALPRAZolam  0.25 mg Oral BID   brimonidine  1 drop Both Eyes BID   finasteride  5 mg Oral Daily   flecainide  50 mg Oral BID   [START ON 01/31/2019] lacosamide  150 mg Oral BID   latanoprost  1 drop Both Eyes QHS   levETIRAcetam  3,000 mg Oral QHS   phenytoin  100 mg Oral Daily   And   phenytoin  300 mg Oral QHS   sodium chloride flush  3 mL Intravenous Q12H   tamsulosin  0.4 mg Oral Daily   timolol  1 drop Both Eyes BID   Warfarin - Pharmacist Dosing Inpatient   Does not apply q1800   Continuous Infusions:   LOS: 0 days    Time spent: 52 minutes    Nance Mccombs, MD [facility] Pager [phone]  If 7PM-7AM, please contact night-coverage [email or web address] Password [email or web address] 01/30/2019, 1:12 PM

## 2019-01-31 ENCOUNTER — Other Ambulatory Visit: Payer: Self-pay

## 2019-01-31 LAB — BASIC METABOLIC PANEL
Anion gap: 10 (ref 5–15)
BUN: 6 mg/dL — ABNORMAL LOW (ref 8–23)
CO2: 22 mmol/L (ref 22–32)
Calcium: 8.7 mg/dL — ABNORMAL LOW (ref 8.9–10.3)
Chloride: 95 mmol/L — ABNORMAL LOW (ref 98–111)
Creatinine, Ser: 0.57 mg/dL — ABNORMAL LOW (ref 0.61–1.24)
GFR calc Af Amer: >60 mL/min (ref >60)
GFR calc non Af Amer: >60 mL/min (ref >60)
Glucose, Bld: 103 mg/dL — ABNORMAL HIGH (ref 70–99)
Potassium: 4 mmol/L (ref 3.5–5.1)
Sodium: 127 mmol/L — ABNORMAL LOW (ref 135–145)

## 2019-01-31 LAB — PROTIME-INR
INR: 2.7 — ABNORMAL HIGH (ref 0.8–1.2)
Prothrombin Time: 28.6 seconds — ABNORMAL HIGH (ref 11.4–15.2)

## 2019-01-31 LAB — GLUCOSE, CAPILLARY: Glucose-Capillary: 88 mg/dL (ref 70–99)

## 2019-01-31 MED ORDER — LORAZEPAM 2 MG/ML IJ SOLN
1.0000 mg | Freq: Once | INTRAMUSCULAR | Status: AC
  Administered 2019-01-31: 1 mg via INTRAVENOUS
  Filled 2019-01-31: qty 1

## 2019-01-31 MED ORDER — SODIUM CHLORIDE 1 G PO TABS
1.0000 g | ORAL_TABLET | Freq: Three times a day (TID) | ORAL | Status: DC
  Administered 2019-01-31 (×2): 1 g via ORAL
  Filled 2019-01-31 (×2): qty 1

## 2019-01-31 MED ORDER — WARFARIN SODIUM 2.5 MG PO TABS
2.5000 mg | ORAL_TABLET | Freq: Once | ORAL | Status: AC
  Administered 2019-01-31: 2.5 mg via ORAL
  Filled 2019-01-31: qty 1

## 2019-01-31 NOTE — Evaluation (Signed)
Physical Therapy Evaluation Patient Details Name: [place] Potts MRN: [id number] DOB: [birth year] Today's Date: 01/31/2019   History of Present Illness  Pt is a 83 y.o. M with significant PMH of seizure disorder on 3 antiepileptics and status post VNS, atrial fibrillation who presents aftera  series of seizures. Noncontrast head CT negative for acute findings, notable for old lesions.  Clinical Impression  Pt admitted with above. Pt is confused, displaying decreased orientation, attention, and awareness. Pt with focused attention, attempting to call wife but unable to problem solve how to dial or hold phone, refusing PT assist. Pt sitting up in bed and lying back down several times without physical assist. Refusing to stand up or walk, stating, "I can't without my shoes and personal walker." Based on bed mobility, suspect pt will be at a min assist level for mobility and can return to [facility] if 24/7 supervision/assist is available as he may do better in familiar environment. Will continue to assess.     Follow Up Recommendations Home health PT;Supervision/Assistance - 24 hour (return to [facility])    Equipment Recommendations  None recommended by PT    Recommendations for Other Services       Precautions / Restrictions Precautions Precautions: Fall Restrictions Weight Bearing Restrictions: No      Mobility  Bed Mobility Overal bed mobility: Needs Assistance Bed Mobility: Supine to Sit;Sit to Supine     Supine to sit: Supervision Sit to supine: Supervision   General bed mobility comments: Pt sitting up on edge of bed and lying back down twice without physical assist  Transfers                 General transfer comment: deferred by pt  Ambulation/Gait             General Gait Details: deferred by pt  Stairs            Wheelchair Mobility    Modified Rankin (Stroke Patients Only)       Balance Overall balance assessment: Needs  assistance Sitting-balance support: Feet unsupported Sitting balance-Leahy Scale: Good                                       Pertinent Vitals/Pain Pain Assessment: Faces Faces Pain Scale: No hurt    Home Living Family/patient expects to be discharged to:: Assisted living               Home Equipment: Gilford Rile - 2 wheels Additional Comments: From [facility]    Prior Function Level of Independence: Independent with assistive device(s)         Comments: ambulates with RW     Hand Dominance   Dominant Hand: Right    Extremity/Trunk Assessment   Upper Extremity Assessment Upper Extremity Assessment: Overall WFL for tasks assessed    Lower Extremity Assessment Lower Extremity Assessment: Overall WFL for tasks assessed       Communication   Communication: Expressive difficulties(mildly dysarthric)  Cognition Arousal/Alertness: Awake/alert Behavior During Therapy: Restless Overall Cognitive Status: Impaired/Different from baseline Area of Impairment: Memory;Following commands;Attention;Orientation;Awareness;Problem solving                 Orientation Level: Disoriented to;Place;Time;Situation Current Attention Level: Focused Memory: Decreased short-term memory Following Commands: Follows one step commands inconsistently   Awareness: Intellectual Problem Solving: Slow processing;Difficulty sequencing;Requires verbal cues General Comments: Pt following simple commands inconsistently, limited by focused  attention. Asking to call his wife on hospital phone, once PT handed him the phone, pt pressing wrong numbers repeatedly and holding the phone upside down, refusing PT assist, stating, "I've dialed a hospital phone a million times." Pt did not recall doctor being in room 5 minutes prior.       General Comments      Exercises     Assessment/Plan    PT Assessment Patient needs continued PT services  PT Problem List Decreased  strength;Decreased activity tolerance;Decreased balance;Decreased mobility;Decreased safety awareness;Decreased cognition       PT Treatment Interventions DME instruction;Gait training;Functional mobility training;Therapeutic activities;Therapeutic exercise;Balance training;Patient/family education    PT Goals (Current goals can be found in the Care Plan section)  Acute Rehab PT Goals Patient Stated Goal: "call my wife." PT Goal Formulation: With patient Time For Goal Achievement: 02/14/19 Potential to Achieve Goals: Good    Frequency Min 3X/week   Barriers to discharge        Co-evaluation               AM-PAC PT "6 Clicks" Mobility  Outcome Measure Help needed turning from your back to your side while in a flat bed without using bedrails?: None Help needed moving from lying on your back to sitting on the side of a flat bed without using bedrails?: None Help needed moving to and from a bed to a chair (including a wheelchair)?: A Little Help needed standing up from a chair using your arms (e.g., wheelchair or bedside chair)?: A Little Help needed to walk in hospital room?: A Little Help needed climbing 3-5 steps with a railing? : A Lot 6 Click Score: 19    End of Session Equipment Utilized During Treatment: Gait belt Activity Tolerance: Treatment limited secondary to agitation Patient left: in bed;with call bell/phone within reach;with bed alarm set Nurse Communication: Mobility status PT Visit Diagnosis: Unsteadiness on feet (R26.81);Difficulty in walking, not elsewhere classified (R26.2)    Time: 7416-3845 PT Time Calculation (min) (ACUTE ONLY): 20 min   Charges:   PT Evaluation $PT Eval Moderate Complexity: 1 Mod          Ellamae Sia, [place], DPT Acute Rehabilitation Services Pager [phone] Office [phone]   Willy Eddy 01/31/2019, 10:01 AM

## 2019-01-31 NOTE — Progress Notes (Signed)
ANTICOAGULATION CONSULT NOTE - Initial Consult  Pharmacy Consult for Warfarin  Indication: atrial fibrillation  Allergies  Allergen Reactions  . Depakote [Divalproex Sodium] Other (See Comments)    Arthralgias      Vital Signs: Temp: 97.4 F (36.3 C) (08/16 0700) Temp Source: Oral (08/16 0700) BP: 148/65 (08/16 0700) Pulse Rate: 59 (08/16 0700)  Labs: Recent Labs    01/29/19 2218 01/30/19 0211 01/30/19 0638 01/31/19 0952  HGB 11.6*  --  11.2*  --   HCT 32.7*  --  31.2*  --   PLT 248  --  238  --   LABPROT  --  33.6*  --  28.6*  INR  --  3.4*  --  2.7*  CREATININE 0.85  --  0.65 0.57*    Estimated Creatinine Clearance: 72.4 mL/min (A) (by C-G formula based on SCr of 0.57 mg/dL (L)).   Medical History: Past Medical History:  Diagnosis Date  . Abnormality of gait 12/21/2013  . Atrial fibrillation ([place])   . Blind left eye   . Carcinoma in situ of prostate   . Cardiac arrest ([place])   . Glaucoma   . Hypercholesteremia   . Hyperlipidemia   . Metabolic encephalopathy   . Mild left ventricular systolic dysfunction   . Presence of other specified functional implants   . PSA elevation   . Right frontal lobe lesion   . Seizures ([place])   . Tinea pedis   . 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 Urinary urgency     Assessment: 83 y/o M from nursing facility with seizures, on warfarin PTA for afib, continuing warfarin  INR this am 2.7 which is within goal range  Home warfarin regimen: 2.5 mg each day of week at 12 pm except on Monday take 2.75 mg at 12 pm  Goal of Therapy:  INR 2-3 Monitor platelets by anticoagulation protocol: Yes   Plan:  Will give warfarin 2.5 mg dose tonight -Will reassess INR with AM labs tomorrow   Alanda Slim, PharmD, [facility] Clinical Pharmacist Please see AMION for all Pharmacists' Contact Phone Numbers 01/31/2019, 10:44 AM

## 2019-01-31 NOTE — Progress Notes (Signed)
PROGRESS NOTE    Cameron Potts  LEX:[id number] DOB: [birth year] DOA: 01/29/2019 PCP: Gayland Curry, DO    Brief Narrative: 83 year old male with a past medical history of seizure disorder on 3 antiepileptic medications, s/p VNS, atrial fibrillation on Coumadin presenting to the emergency department with complaints of multiple episodes of seizures on 01/29/2019 at his residence, also noted to have a prolonged seizure that prompted EMS to call, and brought to the emergency department.  Patient received 2 doses of Versed.  Work-up in the emergency department CT without contrast was negative for acute findings, notable for old encephalomalacia.  Lab work-up revealed mild hyponatremia, anemia.  Phenytoin level is therapeutic.  Neurology is consulted, recommended to continue seizure medications parenterally.  Patient did not have any episodes of seizures after the admission.  Patient has dysarthria, is able to communicate.  ##Seizures recurrent -Continue with phenytoin, Keppra, Vimpat -Continue with seizure precautions -No further episodes of seizures since admission -Discussed with neurology Dr. Malen Gauze who recommended to continue the medications, no further changes.  ##Hyponatremia -Patient has chronic hyponatremia had a sodium level of 126 about a week back, has been on salt tablets -Patient's sodium trended down to 127.  Keep the patient on fluid restriction, salt tablets and follow-up  ##Debility -Involved physical therapy-patient was not cooperative to participate in physical therapy.  However patient is independently sitting in the bed.  ##Benign prostatic hypertrophy -Continue with Flomax  ##Atrial fibrillation-continue the flecainide -Start back on Coumadin as INR is 2.7.  ##Encephalopathy with agitation -Concern about patient has underlying mild dementia, has sundowning at nighttime. -Patient received 1 dose of Ativan last night  Assessment & Plan:   Principal Problem:  Breakthrough seizure ([place]) Active Problems:   Chronic atrial fibrillation   Parkinsonian syndrome associated with symptomatic orthostatic hypotension (HCC)   Hyponatremia   Hypercoagulability due to atrial fibrillation (HCC)   Intractable seizures (HCC)    DVT prophylaxis: anticoagulated Code Status: (Full Family Communication: Wife over the phone Disposition Plan: Home with home health  Consultants:   Neurology    Subjective: Patient had.'s of confusion.  Was refusing to take his Dilantin as this is generic only wants to take the pill what he takes at home.  Discussed with nursing staff to get from home.  Objective: Vitals:   01/31/19 0238 01/31/19 0429 01/31/19 0700 01/31/19 1100  BP:  (!) 111/53 (!) 148/65 139/66  Pulse:   (!) 59 62  Resp:   16 17  Temp:  98.5 F (36.9 C) (!) 97.4 F (36.3 C) 98.3 F (36.8 C)  TempSrc:  Axillary Oral Oral  SpO2:   98% 100%  Weight: 76.2 kg     Height: 5' 9.02" (1.753 m)       Intake/Output Summary (Last 24 hours) at 01/31/2019 1248 Last data filed at 01/31/2019 0431 Gross per 24 hour  Intake 3 ml  Output 1000 ml  Net -997 ml   Filed Weights   01/31/19 0238  Weight: 76.2 kg    Examination:  General exam: Appears calm and comfortable  Respiratory system: Clear to auscultation. Respiratory effort normal. Cardiovascular system: S1 & S2 heard, RRR. No JVD, murmurs, rubs, gallops or clicks. No pedal edema. Gastrointestinal system: Abdomen is nondistended, soft and nontender. No organomegaly or masses felt. Normal bowel sounds heard. Central nervous system: Alert and oriented. No focal neurological deficits. Extremities: Symmetric 5 x 5 power. Skin: No rashes, lesions or ulcers Psychiatry: Irritable.  Is been confused overnight   Data  Reviewed: I have personally reviewed following labs and imaging studies  CBC: Recent Labs  Lab 01/29/19 2218 01/30/19 0638  WBC 9.4 6.3  NEUTROABS 6.1  --   HGB 11.6* 11.2*  HCT 32.7*  31.2*  MCV 90.8 89.9  PLT 248 315   Basic Metabolic Panel: Recent Labs  Lab 01/29/19 0012 01/29/19 2218 01/30/19 0638 01/31/19 0952  NA  --  133* 132* 127*  K  --  4.6 4.1 4.0  CL  --  98 101 95*  CO2  --  24 22 22   GLUCOSE  --  109* 104* 103*  BUN  --  15 11 6*  CREATININE  --  0.85 0.65 0.57*  CALCIUM  --  8.7* 8.5* 8.7*  MG 1.9  --   --   --    GFR: Estimated Creatinine Clearance: 72.4 mL/min (A) (by C-G formula based on SCr of 0.57 mg/dL (L)). Liver Function Tests: No results for input(s): AST, ALT, ALKPHOS, BILITOT, PROT, ALBUMIN in the last 168 hours. No results for input(s): LIPASE, AMYLASE in the last 168 hours. No results for input(s): AMMONIA in the last 168 hours. Coagulation Profile: Recent Labs  Lab 01/30/19 0211 01/31/19 0952  INR 3.4* 2.7*   Cardiac Enzymes: No results for input(s): CKTOTAL, CKMB, CKMBINDEX, TROPONINI in the last 168 hours. BNP (last 3 results) No results for input(s): PROBNP in the last 8760 hours. HbA1C: No results for input(s): HGBA1C in the last 72 hours. CBG: Recent Labs  Lab 01/30/19 0829 01/31/19 0758  GLUCAP 90 88   Lipid Profile: No results for input(s): CHOL, HDL, LDLCALC, TRIG, CHOLHDL, LDLDIRECT in the last 72 hours. Thyroid Function Tests: No results for input(s): TSH, T4TOTAL, FREET4, T3FREE, THYROIDAB in the last 72 hours. Anemia Panel: No results for input(s): VITAMINB12, FOLATE, FERRITIN, TIBC, IRON, RETICCTPCT in the last 72 hours. Sepsis Labs: Recent Labs  Lab 01/29/19 2219  LATICACIDVEN 1.2    Recent Results (from the past 240 hour(s))  SARS Coronavirus 2 [facility] order, Performed in [facility] hospital lab) Nasopharyngeal Nasopharyngeal Swab     Status: None   Collection Time: 01/30/19  4:54 AM   Specimen: Nasopharyngeal Swab  Result Value Ref Range Status   SARS Coronavirus 2 NEGATIVE NEGATIVE Final    Comment: (NOTE) If result is NEGATIVE SARS-CoV-2 target nucleic acids are NOT DETECTED. The  SARS-CoV-2 RNA is generally detectable in upper and lower  respiratory specimens during the acute phase of infection. The lowest  concentration of SARS-CoV-2 viral copies this assay can detect is 250  copies / mL. A negative result does not preclude SARS-CoV-2 infection  and should not be used as the sole basis for treatment or other  patient management decisions.  A negative result may occur with  improper specimen collection / handling, submission of specimen other  than nasopharyngeal swab, presence of viral mutation(s) within the  areas targeted by this assay, and inadequate number of viral copies  (<250 copies / mL). A negative result must be combined with clinical  observations, patient history, and epidemiological information. If result is POSITIVE SARS-CoV-2 target nucleic acids are DETECTED. The SARS-CoV-2 RNA is generally detectable in upper and lower  respiratory specimens dur ing the acute phase of infection.  Positive  results are indicative of active infection with SARS-CoV-2.  Clinical  correlation with patient history and other diagnostic information is  necessary to determine patient infection status.  Positive results do  not rule out bacterial infection or co-infection  with other viruses. If result is PRESUMPTIVE POSTIVE SARS-CoV-2 nucleic acids MAY BE PRESENT.   A presumptive positive result was obtained on the submitted specimen  and confirmed on repeat testing.  While 2019 novel coronavirus  (SARS-CoV-2) nucleic acids may be present in the submitted sample  additional confirmatory testing may be necessary for epidemiological  and / or clinical management purposes  to differentiate between  SARS-CoV-2 and other Sarbecovirus currently known to infect humans.  If clinically indicated additional testing with an alternate test  methodology [phone]) is advised. The SARS-CoV-2 RNA is generally  detectable in upper and lower respiratory sp ecimens during the acute    phase of infection. The expected result is Negative. Fact Sheet for Patients:  StrictlyIdeas.no Fact Sheet for Healthcare Providers: BankingDealers.co.za This test is not yet approved or cleared by the [place] FDA and has been authorized for detection and/or diagnosis of SARS-CoV-2 by FDA under an Emergency Use Authorization (EUA).  This EUA will remain in effect (meaning this test can be used) for the duration of the COVID-19 declaration under Section 564(b)(1) of the Act, 21 U.S.C. section 360bbb-3(b)(1), unless the authorization is terminated or revoked sooner. Performed at [facility], [place] [address] [place], [place] [address]   MRSA PCR Screening     Status: None   Collection Time: 01/30/19  5:55 AM   Specimen: Nasal Mucosa; Nasopharyngeal  Result Value Ref Range Status   MRSA by PCR NEGATIVE NEGATIVE Final    Comment:        The GeneXpert MRSA Assay (FDA approved for NASAL specimens only), is one component of a comprehensive MRSA colonization surveillance program. It is not intended to diagnose MRSA infection nor to guide or monitor treatment for MRSA infections. Performed at [facility], [place] [address] [place], [place] [address]          Radiology Studies: Ct Head Wo Contrast  Result Date: 01/29/2019 CLINICAL DATA:  Altered level consciousness EXAM: CT HEAD WITHOUT CONTRAST TECHNIQUE: Contiguous axial images were obtained from the base of the skull through the vertex without intravenous contrast. COMPARISON:  Nov 02, 2018 FINDINGS: Brain: No evidence of acute territorial infarction, hemorrhage, hydrocephalus,extra-axial collection or mass lesion/mass effect. There is dilatation the ventricles and sulci consistent with age-related atrophy. Low-attenuation changes in the deep white matter consistent with small vessel ischemia. There is a large area of encephalomalacia involving the right temporal lobe.  There is also lacunar infarct involving the right posterior frontal lobe as on the prior exam. Vascular: No hyperdense vessel or unexpected calcification. Calcifications are seen within the internal carotid artery and vertebral arteries. Skull: The skull is intact. No fracture or focal lesion identified. Sinuses/Orbits: Bilateral maxillary mucosal sinus thickening is seen. There is thickening of the ethmoid air cells. The mastoid air cells are clear. Other: None IMPRESSION: 1. No acute intracranial abnormality. 2. Findings consistent with age related atrophy and chronic small vessel ischemia 3. Encephalomalacia involving the right temporal lobe and prior lacunar infarct involving the right frontal lobe. 4. 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 Electronically Signed   By: Prudencio Pair M.D.   On: 01/29/2019 23:02        Scheduled Meds:  ALPRAZolam  0.25 mg Oral BID   brimonidine  1 drop Both Eyes BID   finasteride  5 mg Oral Daily   flecainide  50 mg Oral BID   lacosamide  150 mg Oral BID   latanoprost  1 drop Both  Eyes QHS   levETIRAcetam  3,000 mg Oral QHS   phenytoin  100 mg Oral Daily   And   phenytoin  300 mg Oral QHS   sodium chloride flush  3 mL Intravenous Q12H   tamsulosin  0.4 mg Oral Daily   timolol  1 drop Both Eyes BID   warfarin  2.5 mg Oral ONCE-1800   Warfarin - Pharmacist Dosing Inpatient   Does not apply q1800   Continuous Infusions:   LOS: 1 day    Time spent: 35 minutes    Jaymien Landin, MD [facility] Pager [phone]  If 7PM-7AM, please contact night-coverage [email or web address] Password [facility] 01/31/2019, 12:48 PM

## 2019-01-31 NOTE — Progress Notes (Signed)
Patient was pleasantly confused. Patient refused taking his night medications. Wife was called and after a lengthy talk with him, agreed to take his medications. At approximately 23:50,Patient's level of confusion increased, patient was trying getting out of bed, according to patient," he has an air plane trip to catch up with". MD notified and ativan 1mg  x 1 was prescribed and administered.Marland Kitchen

## 2019-01-31 NOTE — Progress Notes (Signed)
Pt's wife dropped off home medication Dilantin. RN verified count of 99 with wife. RN and Agricultural consultant verified count of 99 before taking down to Pharmacy. RN texted paged MD to get order to distribute home med.

## 2019-02-01 ENCOUNTER — Telehealth: Payer: Self-pay | Admitting: Neurology

## 2019-02-01 LAB — BASIC METABOLIC PANEL
Anion gap: 7 (ref 5–15)
BUN: 7 mg/dL — ABNORMAL LOW (ref 8–23)
CO2: 22 mmol/L (ref 22–32)
Calcium: 8.4 mg/dL — ABNORMAL LOW (ref 8.9–10.3)
Chloride: 98 mmol/L (ref 98–111)
Creatinine, Ser: 0.63 mg/dL (ref 0.61–1.24)
GFR calc Af Amer: >60 mL/min (ref >60)
GFR calc non Af Amer: >60 mL/min (ref >60)
Glucose, Bld: 98 mg/dL (ref 70–99)
Potassium: 4.1 mmol/L (ref 3.5–5.1)
Sodium: 127 mmol/L — ABNORMAL LOW (ref 135–145)

## 2019-02-01 LAB — GLUCOSE, CAPILLARY
Glucose-Capillary: 117 mg/dL — ABNORMAL HIGH (ref 70–99)
Glucose-Capillary: 105 mg/dL — ABNORMAL HIGH (ref 70–99)
Glucose-Capillary: 90 mg/dL (ref 70–99)
Glucose-Capillary: 131 mg/dL — ABNORMAL HIGH (ref 70–99)

## 2019-02-01 LAB — PROTIME-INR
INR: 2.7 — ABNORMAL HIGH (ref 0.8–1.2)
Prothrombin Time: 28.6 seconds — ABNORMAL HIGH (ref 11.4–15.2)

## 2019-02-01 LAB — SARS CORONAVIRUS 2 BY RT PCR (HOSPITAL ORDER, PERFORMED IN ~~LOC~~ HOSPITAL LAB): SARS Coronavirus 2: NEGATIVE

## 2019-02-01 MED ORDER — SODIUM CHLORIDE 1 G PO TABS
2.0000 g | ORAL_TABLET | Freq: Three times a day (TID) | ORAL | Status: DC
  Administered 2019-02-01 – 2019-02-02 (×5): 2 g via ORAL
  Filled 2019-02-01: qty 2
  Filled 2019-02-01: qty 1
  Filled 2019-02-01 (×4): qty 2

## 2019-02-01 MED ORDER — SODIUM CHLORIDE 1 G PO TABS: 1.0000 g | ORAL_TABLET | Freq: Two times a day (BID) | ORAL | Status: DC

## 2019-02-01 MED ORDER — WARFARIN SODIUM 2.5 MG PO TABS
2.5000 mg | ORAL_TABLET | Freq: Once | ORAL | Status: AC
  Administered 2019-02-01: 2.5 mg via ORAL
  Filled 2019-02-01: qty 1

## 2019-02-01 NOTE — Telephone Encounter (Signed)
I reached out to Ms. Cameron Potts. She reports the pt was sent to hospital on 01/29/2019 due to seizures. Pt sodium levels have dropped to 127 since oral sodium supplementation was d/c by the hospitalitis. Wife is not satisfied with the care the pt is receiving and is considering signing him out AMA. I advised the pt's wife Dr. Jannifer Franklin is currently not in the office ( he will return 02/08/2019) and it would best to keep him in the hospital so they can help raise the sodium levels. I also suggested speaking with the supervisor on the floor in regards to her to her concerns to the care he is currently receiving.  Pt verbalized understanding and appreciation for the call I also advised I would fwd message to Dr. Jannifer Franklin as a FYI.

## 2019-02-01 NOTE — TOC Progression Note (Addendum)
Transition of Care [facility]) - Progression Note    Patient Details  Name: Cameron Potts MRN: [id number] Date of Birth: 12/30/1935  Transition of Care [facility]) CM/SW [place], [place] Phone Number: 02/01/2019, 4:05 PM  Clinical Narrative:   Patient will not discharge today-  CSW informed MD of [facility] (ALF) concerns of patient's sodium levels and requested patient stay another day. MD agreed the patient will stay another night.   [facility] states they have  updated the patient's wife and she is aware the patient will be staying another night. Charged RN updated.   Thurmond Butts, MSW, [facility] Clinical Social Worker [phone]   Expected Discharge Plan: Assisted Living    Expected Discharge Plan and Services Expected Discharge Plan: Assisted Living         Expected Discharge Date: 02/01/19                                     Social Determinants of Health (SDOH) Interventions    Readmission Risk Interventions No flowsheet data found.

## 2019-02-01 NOTE — Progress Notes (Signed)
ANTICOAGULATION CONSULT NOTE - Initial Consult  Pharmacy Consult for Warfarin  Indication: atrial fibrillation  Allergies  Allergen Reactions  . Depakote [Divalproex Sodium] Other (See Comments)    Arthralgias      Vital Signs: Temp: 97.6 F (36.4 C) (08/17 0801) Temp Source: Oral (08/17 0801) BP: 162/65 (08/17 0801) Pulse Rate: 59 (08/17 0801)  Labs: Recent Labs    01/29/19 2218 01/30/19 0211 01/30/19 8811 01/31/19 0952 02/01/19 0455  HGB 11.6*  --  11.2*  --   --   HCT 32.7*  --  31.2*  --   --   PLT 248  --  238  --   --   LABPROT  --  33.6*  --  28.6* 28.6*  INR  --  3.4*  --  2.7* 2.7*  CREATININE 0.85  --  0.65 0.57* 0.63    Estimated Creatinine Clearance: 72.4 mL/min (by C-G formula based on SCr of 0.63 mg/dL).   Medical History: Past Medical History:  Diagnosis Date  . Abnormality of gait 12/21/2013  . Atrial fibrillation ([place])   . Blind left eye   . Carcinoma in situ of prostate   . Cardiac arrest ([place])   . Glaucoma   . Hypercholesteremia   . Hyperlipidemia   . Metabolic encephalopathy   . Mild left ventricular systolic dysfunction   . Presence of other specified functional implants   . PSA elevation   . Right frontal lobe lesion   . Seizures ([place])   . Tinea pedis   . 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 Urinary urgency     Assessment: 83 y/o M from nursing facility with seizures, on warfarin PTA for afib, continuing warfarin  INR this am stable at 2.7 which is within goal range  Home warfarin regimen: 2.5 mg each day of week at 12 pm except on Monday take 2.75 mg at 12 pm  Goal of Therapy:  INR 2-3 Monitor platelets by anticoagulation protocol: Yes   Plan:  Will give warfarin 2.5 mg dose tonight -Will reassess INR with AM labs tomorrow   Alanda Slim, PharmD, [facility] Clinical Pharmacist Please see AMION for all Pharmacists' Contact Phone Numbers 02/01/2019, 9:01 AM

## 2019-02-01 NOTE — Discharge Instructions (Signed)
Check BMP every other day to ensure sodium levels are improving starting from tomorrow.

## 2019-02-01 NOTE — TOC Initial Note (Signed)
Transition of Care [facility]) - Initial/Assessment Note    Patient Details  Name: Cameron Potts MRN: [id number] Date of Birth: 03-10-1936  Transition of Care [facility]) CM/SW Contact:    Vinie Sill, [place] Phone Number: 02/01/2019, 12:54 PM  Clinical Narrative:                  CSW visit with the patient at bedside.CSW introduced self and role .Patient was participating with PT. CSW spoke with the patient's wife, Opal Sidles. She confirmed the patient is a resident of [facility] and patient will return at discharge. Patient's wife  expressed understanding of CSW role and discharge process as well as medical condition. No questions/concerns about plan or treatment at this time.  CSW spoke with SW Butch Penny at [facility]- advised the CSW patient needs Covid and FL2.   RN updated- patient needs Covid - CSW will continue to follow and assist with discharge planning.   Thurmond Butts, MSW, [facility] Clinical Social Worker [phone]   Expected Discharge Plan: Assisted Living     Patient Goals and CMS Choice Patient states their goals for this hospitalization and ongoing recovery are:: return to ALF      Expected Discharge Plan and Services Expected Discharge Plan: Assisted Living         Expected Discharge Date: 02/01/19                                    Prior Living Arrangements/Services   Lives with:: Facility Resident Patient language and need for interpreter reviewed:: Yes        Need for Family Participation in Patient Care: Yes (Comment) Care giver support system in place?: Yes (comment)   Criminal Activity/Legal Involvement Pertinent to Current Situation/Hospitalization: No - Comment as needed  Activities of Daily Living Home Assistive Devices/Equipment: Walker (specify type) ADL Screening (condition at time of admission) Patient's cognitive ability adequate to safely complete daily activities?: Yes Is the patient deaf or have difficulty hearing?: No Does the  patient have difficulty seeing, even when wearing glasses/contacts?: No Does the patient have difficulty concentrating, remembering, or making decisions?: No Patient able to express need for assistance with ADLs?: Yes Does the patient have difficulty dressing or bathing?: Yes Independently performs ADLs?: No Communication: Independent Dressing (OT): Needs assistance Is this a change from baseline?: Pre-admission baseline Grooming: Needs assistance Is this a change from baseline?: Pre-admission baseline Feeding: Needs assistance Is this a change from baseline?: Pre-admission baseline Bathing: Needs assistance Is this a change from baseline?: Pre-admission baseline Toileting: Needs assistance Is this a change from baseline?: Pre-admission baseline In/Out Bed: Dependent Is this a change from baseline?: Pre-admission baseline Walks in Home: Needs assistance Is this a change from baseline?: Pre-admission baseline Does the patient have difficulty walking or climbing stairs?: Yes Weakness of Legs: Both Weakness of Arms/Hands: Both  Permission Sought/Granted Permission sought to share information with : Family Supports Permission granted to share information with : Yes, Verbal Permission Granted  Share Information with NAME: Mattson Dayal  Permission granted to share info w AGENCY: [facility]  Permission granted to share info w Relationship: spouse  Permission granted to share info w Contact Information: [phone]  Emotional Assessment Appearance:: Appears stated age Attitude/Demeanor/Rapport: Engaged Affect (typically observed): Accepting(participating with PT) Orientation: : Oriented to Place, Oriented to Self, Oriented to Situation Alcohol / Substance Use: Not Applicable Psych Involvement: No (comment)  Admission diagnosis:  Seizures Patient Active Problem List   Diagnosis Date Noted  . Breakthrough seizure ([place]) 01/30/2019  . Intractable seizures ([place]) 01/30/2019  .  Altered mental status 11/02/2018  . Hypercoagulability due to atrial fibrillation ([place]) 03/25/2018  . Primary open-angle glaucoma, bilateral, severe stage 03/17/2018  . ICD (implantable cardioverter-defibrillator) in place 03/13/2018  . Parkinsonian syndrome associated with symptomatic orthostatic hypotension ([place]) 03/13/2018  . Refractory epilepsy ([place]) 03/13/2018  . Status post VNS (vagus nerve stimulator) placement 03/13/2018  . 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 Neuropathy, peripheral 12/14/2017  . Hyponatremia 07/12/2017  . Presence of intraocular lens 04/23/2017  . Status epilepticus ([place]) 01/05/2017  . Chronic atrial fibrillation 01/04/2017  . Adjustment disorder with anxious mood 03/25/2014  . Age-related nuclear cataract of both eyes 03/25/2014  . 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  . Hyperlipidemia 03/25/2014  . Carcinoma in situ of prostate 02/08/2014  . Other localized visual field defect, bilateral 02/02/2014  . Abnormality of gait 12/21/2013  . Complex partial seizure ([place]) 04/11/2013  . Seizure disorder ([place]) 04/10/2013  . Osteopenia 12/04/2011  . Vitamin D deficiency 12/04/2011   PCP:  Gayland Curry, DO Pharmacy:   [place] [address] [place] [address] Phone: [phone] Fax: [phone]     Social Determinants of Health (SDOH) Interventions    Readmission Risk Interventions No flowsheet data found.

## 2019-02-01 NOTE — Telephone Encounter (Signed)
Patient is in the [place] hospital because of his major seizures wife states . Patient wife is concerning  checking him out against medical advice and needs to speak with Dr. Jannifer Franklin or nurse. [phone].

## 2019-02-01 NOTE — Progress Notes (Signed)
Physician Discharge Summary  Cameron Potts VZC:[id number] DOB: 14-Sep-1935 DOA: 01/29/2019  PCP: Gayland Curry, DO  Admit date: 01/29/2019 Discharge date: 02/01/2019  Time spent: 40 minutes  Recommendations for Outpatient Follow-up:  1. Follow-up with primary care physician in 1 week 2. Check BMP every other day until sodium levels normalized  3. Discharge patient home with home health for physical therapy   Discharge Diagnoses:  Principal Problem:   Breakthrough seizure ([place]) Active Problems:   Chronic atrial fibrillation   Parkinsonian syndrome associated with symptomatic orthostatic hypotension (HCC)   Hyponatremia   Hypercoagulability due to atrial fibrillation ([place])   Intractable seizures ([place])   Discharge Condition: Stable  Diet recommendation: High salt diet  Filed Weights   01/31/19 0238  Weight: 76.2 kg    History of present illness and Hospital Course:  83 year old male with a past medical history of seizure disorder on 3 antiepileptic medications, s/p VNS, atrial fibrillation on Coumadin presenting to the emergency department with complaints of multiple episodes of seizures on 01/29/2019 at his residence, also noted to have a prolonged seizure that prompted EMS to call, and brought to the emergency department.  Patient received 2 doses of Versed.  Work-up in the emergency department CT without contrast was negative for acute findings, notable for old encephalomalacia.  Lab work-up revealed mild hyponatremia, anemia.  Phenytoin level is therapeutic.  Neurology is consulted, recommended to continue seizure medications parenterally.  Patient did not have any episodes of seizures after the admission.  Patient has dysarthria, is able to communicate.  Patient did not have any further seizures since the admission.  Per neurology recommendations continued with home antiseizure medications.  Patient was found to have sodium of 132 at admission, trended down to 127.  Patient is started  on salt tablets, still remained at 127.  Patient's wife insists patient should be discharged back to assisted living facility.  Patient also has been noncompliant with all the treatments including taking the salt tablets.  Patient has chronic hyponatremia, patient is currently well conscious oriented, no further episodes of seizures, will discharge patient home with home health.  Recommended to check BMP every other day until sodium levels are normalized.  Patient is recommended to follow-up with primary care physician, neurology as an outpatient.  ##Seizures recurrent -Continue with phenytoin, Keppra, Vimpat -Continue with seizure precautions -No further episodes of seizures since admission -Discussed with neurology Dr. Malen Gauze who recommended to continue the medications, no further changes.  ##Hyponatremia -Patient has chronic hyponatremia had a sodium level of 126 about a week back, has been on salt tablets -Patient's sodium trended down to 127.  Keep the patient on fluid restriction, salt tablets and follow-up  ##Debility -Involved physical therapy-patient was not cooperative to participate in physical therapy.  However patient is independently sitting in the bed.  ##Benign prostatic hypertrophy -Continue with Flomax  ##Atrial fibrillation-continue the flecainide -Start back on Coumadin as INR is 2.7.  ##Encephalopathy with agitation -Concern about patient has underlying mild dementia, has sundowning at nighttime. -Patient received 1 dose of Ativan last night  Assessment & Plan:   Principal Problem:   Breakthrough seizure ([place]) Active Problems:   Chronic atrial fibrillation   Parkinsonian syndrome associated with symptomatic orthostatic hypotension (HCC)   Hyponatremia   Hypercoagulability due to atrial fibrillation ([place])   Intractable seizures [facility])     Consultations:  Neurology  Discharge Exam: Vitals:   02/01/19 0801 02/01/19 1257  BP: (!) 162/65 (!) 141/59   Pulse: (!) 59  61  Resp: 20   Temp: 97.6 F (36.4 C) (!) 97.4 F (36.3 C)  SpO2: 100% 98%    General exam: Appears calm and comfortable  Respiratory system: Clear to auscultation. Respiratory effort normal. Cardiovascular system: S1 & S2 heard, RRR. No JVD, murmurs, rubs, gallops or clicks. No pedal edema. Gastrointestinal system: Abdomen is nondistended, soft and nontender. No organomegaly or masses felt. Normal bowel sounds heard. Central nervous system: Alert and oriented. No focal neurological deficits. Extremities: Symmetric 5 x 5 power. Skin: No rashes, lesions or ulcers Psychiatry: Conscious oriented this morning   discharge Instructions   Discharge Instructions    Diet - low sodium heart healthy   Complete by: As directed    Increase activity slowly   Complete by: As directed      Allergies as of 02/01/2019      Reactions   Depakote [divalproex Sodium] Other (See Comments)   Arthralgias      Medication List    TAKE these medications   acetaminophen 325 MG tablet Commonly known as: TYLENOL Take 650 mg by mouth every 6 (six) hours as needed for mild pain or fever.   ALPRAZolam 0.25 MG tablet Commonly known as: XANAX Take 1 tablet (0.25 mg total) by mouth 2 (two) times daily.   Ativan 2 MG/ML injection Generic drug: LORazepam Inject 2 mg into the muscle daily as needed for seizure. NOT TO BE USED FOR BEHAVIORS   cholecalciferol 10 MCG (400 UNIT) Tabs tablet Commonly known as: VITAMIN D3 Take 400 Units by mouth 2 (two) times daily.   Combigan 0.2-0.5 % ophthalmic solution Generic drug: brimonidine-timolol Place 1 drop into both eyes every 12 (twelve) hours.   finasteride 5 MG tablet Commonly known as: PROSCAR Take 5 mg by mouth daily.   flecainide 50 MG tablet Commonly known as: TAMBOCOR Take 50 mg by mouth 2 (two) times daily.   fluocinonide ointment 0.05 % Commonly known as: LIDEX Apply 1 application topically 2 (two) times daily as needed (on  rash /skin folds, not for face).   ketoconazole 2 % shampoo Commonly known as: NIZORAL Apply 1 application topically 2 (two) times a week.   latanoprost 0.005 % ophthalmic solution Commonly known as: XALATAN Place 1 drop into both eyes at bedtime.   Levetiracetam 750 MG Tb24 Commonly known as: Keppra XR Take 4 tablets (3,000 mg total) by mouth at bedtime.   phenytoin 100 MG ER capsule Commonly known as: DILANTIN 1 capsule in the morning, 3 capsules in the evening   polyethylene glycol 17 g packet Commonly known as: MIRALAX / GLYCOLAX Take 17 g by mouth See admin instructions. Once daily every other day and as needed for constipation   sodium chloride 1 g tablet Take 1 tablet (1 g total) by mouth 2 (two) times daily with a meal. What changed: when to take this   tamsulosin 0.4 MG Caps capsule Commonly known as: FLOMAX Take 0.4 mg by mouth every morning.   triamcinolone cream 0.1 % Commonly known as: KENALOG Apply 1 application topically 2 (two) times daily as needed (for flares).   Vimpat 150 MG Tabs Generic drug: Lacosamide Take 1 tablet (150 mg total) by mouth 2 (two) times a day.   warfarin 2.5 MG tablet Commonly known as: COUMADIN Take 1 tablet (2.5 mg total) by mouth daily at 12 noon. What changed:   how much to take  when to take this  additional instructions      Allergies  Allergen  Reactions  . Depakote [Divalproex Sodium] Other (See Comments)    Arthralgias       The results of significant diagnostics from this hospitalization (including imaging, microbiology, ancillary and laboratory) are listed below for reference.    Significant Diagnostic Studies: Ct Head Wo Contrast  Result Date: 01/29/2019 CLINICAL DATA:  Altered level consciousness EXAM: CT HEAD WITHOUT CONTRAST TECHNIQUE: Contiguous axial images were obtained from the base of the skull through the vertex without intravenous contrast. COMPARISON:  Nov 02, 2018 FINDINGS: Brain: No  evidence of acute territorial infarction, hemorrhage, hydrocephalus,extra-axial collection or mass lesion/mass effect. There is dilatation the ventricles and sulci consistent with age-related atrophy. Low-attenuation changes in the deep white matter consistent with small vessel ischemia. There is a large area of encephalomalacia involving the right temporal lobe. There is also lacunar infarct involving the right posterior frontal lobe as on the prior exam. Vascular: No hyperdense vessel or unexpected calcification. Calcifications are seen within the internal carotid artery and vertebral arteries. Skull: The skull is intact. No fracture or focal lesion identified. Sinuses/Orbits: Bilateral maxillary mucosal sinus thickening is seen. There is thickening of the ethmoid air cells. The mastoid air cells are clear. Other: None IMPRESSION: 1. No acute intracranial abnormality. 2. Findings consistent with age related atrophy and chronic small vessel ischemia 3. Encephalomalacia involving the right temporal lobe and prior lacunar infarct involving the right frontal lobe. 4. 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 Electronically Signed   By: Prudencio Pair M.D.   On: 01/29/2019 23:02    Microbiology: Recent Results (from the past 240 hour(s))  SARS Coronavirus 2 [facility] order, Performed in [facility] hospital lab) Nasopharyngeal Nasopharyngeal Swab     Status: None   Collection Time: 01/30/19  4:54 AM   Specimen: Nasopharyngeal Swab  Result Value Ref Range Status   SARS Coronavirus 2 NEGATIVE NEGATIVE Final    Comment: (NOTE) If result is NEGATIVE SARS-CoV-2 target nucleic acids are NOT DETECTED. The SARS-CoV-2 RNA is generally detectable in upper and lower  respiratory specimens during the acute phase of infection. The lowest  concentration of SARS-CoV-2 viral copies this assay can detect is 250  copies / mL. A negative result does not preclude SARS-CoV-2 infection  and should not be used as the  sole basis for treatment or other  patient management decisions.  A negative result may occur with  improper specimen collection / handling, submission of specimen other  than nasopharyngeal swab, presence of viral mutation(s) within the  areas targeted by this assay, and inadequate number of viral copies  (<250 copies / mL). A negative result must be combined with clinical  observations, patient history, and epidemiological information. If result is POSITIVE SARS-CoV-2 target nucleic acids are DETECTED. The SARS-CoV-2 RNA is generally detectable in upper and lower  respiratory specimens dur ing the acute phase of infection.  Positive  results are indicative of active infection with SARS-CoV-2.  Clinical  correlation with patient history and other diagnostic information is  necessary to determine patient infection status.  Positive results do  not rule out bacterial infection or co-infection with other viruses. If result is PRESUMPTIVE POSTIVE SARS-CoV-2 nucleic acids MAY BE PRESENT.   A presumptive positive result was obtained on the submitted specimen  and confirmed on repeat testing.  While 2019 novel coronavirus  (SARS-CoV-2) nucleic acids may be present in the submitted sample  additional confirmatory testing may be necessary for epidemiological  and / or clinical management purposes  to differentiate between  SARS-CoV-2 and other Sarbecovirus currently known to infect humans.  If clinically indicated additional testing with an alternate test  methodology [phone]) is advised. The SARS-CoV-2 RNA is generally  detectable in upper and lower respiratory sp ecimens during the acute  phase of infection. The expected result is Negative. Fact Sheet for Patients:  StrictlyIdeas.no Fact Sheet for Healthcare Providers: BankingDealers.co.za This test is not yet approved or cleared by the [place] FDA and has been authorized for detection  and/or diagnosis of SARS-CoV-2 by FDA under an Emergency Use Authorization (EUA).  This EUA will remain in effect (meaning this test can be used) for the duration of the COVID-19 declaration under Section 564(b)(1) of the Act, 21 U.S.C. section 360bbb-3(b)(1), unless the authorization is terminated or revoked sooner. Performed at [facility], [place] [address], [place], [place] [address]   MRSA PCR Screening     Status: None   Collection Time: 01/30/19  5:55 AM   Specimen: Nasal Mucosa; Nasopharyngeal  Result Value Ref Range Status   MRSA by PCR NEGATIVE NEGATIVE Final    Comment:        The GeneXpert MRSA Assay (FDA approved for NASAL specimens only), is one component of a comprehensive MRSA colonization surveillance program. It is not intended to diagnose MRSA infection nor to guide or monitor treatment for MRSA infections. Performed at [facility], [place] [address], [place], [place] [address]      Labs: Basic Metabolic Panel: Recent Labs  Lab 01/29/19 0012 01/29/19 2218 01/30/19 4656 01/31/19 0952 02/01/19 0455  NA  --  133* 132* 127* 127*  K  --  4.6 4.1 4.0 4.1  CL  --  98 101 95* 98  CO2  --  24 22 22 22   GLUCOSE  --  109* 104* 103* 98  BUN  --  15 11 6* 7*  CREATININE  --  0.85 0.65 0.57* 0.63  CALCIUM  --  8.7* 8.5* 8.7* 8.4*  MG 1.9  --   --   --   --    Liver Function Tests: No results for input(s): AST, ALT, ALKPHOS, BILITOT, PROT, ALBUMIN in the last 168 hours. No results for input(s): LIPASE, AMYLASE in the last 168 hours. No results for input(s): AMMONIA in the last 168 hours. CBC: Recent Labs  Lab 01/29/19 2218 01/30/19 0638  WBC 9.4 6.3  NEUTROABS 6.1  --   HGB 11.6* 11.2*  HCT 32.7* 31.2*  MCV 90.8 89.9  PLT 248 238   Cardiac Enzymes: No results for input(s): CKTOTAL, CKMB, CKMBINDEX, TROPONINI in the last 168 hours. BNP: BNP (last 3 results) No results for input(s): BNP in the last 8760 hours.  ProBNP (last 3 results) No  results for input(s): PROBNP in the last 8760 hours.  CBG: Recent Labs  Lab 01/30/19 0829 01/31/19 0758 02/01/19 0758 02/01/19 1206  GLUCAP 90 88 117* 105*       Signed:  Arlet Marter MD.  [facility] 02/01/2019, 1:22 PM

## 2019-02-01 NOTE — Progress Notes (Signed)
Physical Therapy Treatment Patient Details Name: Cameron Potts MRN: [id number] DOB: 1936-03-10 Today's Date: 02/01/2019    History of Present Illness Pt is a 83 y.o. M with significant PMH of seizure disorder on 3 antiepileptics and status post VNS, atrial fibrillation who presents aftera  series of seizures. Noncontrast head CT negative for acute findings, notable for old lesions.    PT Comments    Patient progressing well towards PT goals. Tolerated gait training today with Min guard assist for balance/safety. Pt with orthostatic hypotension. Per wife who is present in the room reports this is not new and has caused pt to "pass out" numerous times at his ALF. Pt asymptomatic throughout session with no reports of dizziness.  Supine BP 140/60 Sitting BP112/98 Sitting ~3 mins BP 148/63 BP post activity sitting in chair 109/92 Pt continues to demonstrate some cognitive deficits relating to orientation, attention, problem solving and memory. Safe to d/c back to ALF with continued therapy. Will follow.   Follow Up Recommendations  Home health PT;Supervision/Assistance - 24 hour     Equipment Recommendations  None recommended by PT    Recommendations for Other Services       Precautions / Restrictions Precautions Precautions: Fall Precaution Comments: orthostatic, watch BP Restrictions Weight Bearing Restrictions: No    Mobility  Bed Mobility Overal bed mobility: Needs Assistance Bed Mobility: Supine to Sit     Supine to sit: Supervision;HOB elevated     General bed mobility comments: Supervision for safety. Use of rails. Posterior bias.  Transfers Overall transfer level: Needs assistance Equipment used: Rolling walker (2 wheeled) Transfers: Sit to/from Stand Sit to Stand: Min assist         General transfer comment: Assist to power to standing with cues for hand placement/technique. Transferred to chair post ambulation.  Ambulation/Gait Ambulation/Gait assistance:  Min guard Gait Distance (Feet): 200 Feet Assistive device: Rolling walker (2 wheeled) Gait Pattern/deviations: Step-through pattern;Decreased stride length;Trunk flexed Gait velocity: decreased Gait velocity interpretation: <1.8 ft/sec, indicate of risk for recurrent falls General Gait Details: Slow, mostly steady gait with RW for safety. Chair follow due to BP issues.   Stairs             Wheelchair Mobility    Modified Rankin (Stroke Patients Only)       Balance Overall balance assessment: Needs assistance Sitting-balance support: Feet supported;No upper extremity supported Sitting balance-Leahy Scale: Fair Sitting balance - Comments: posterior lean sitting EOB; encouraged to pump BLEs to help increase BP Postural control: Posterior lean Standing balance support: During functional activity Standing balance-Leahy Scale: Poor Standing balance comment: Reliant on BUEs for support in standing.                            Cognition Arousal/Alertness: Awake/alert Behavior During Therapy: WFL for tasks assessed/performed Overall Cognitive Status: Impaired/Different from baseline Area of Impairment: Attention;Memory;Awareness;Following commands;Safety/judgement                 Orientation Level: Disoriented to;Time Current Attention Level: Sustained Memory: Decreased short-term memory Following Commands: Follows one step commands consistently   Awareness: Intellectual Problem Solving: Slow processing;Requires verbal cues General Comments: Follows simple commands consistently with repetition at times. Wife present and able to reorient pt to day of week/situation, "I had that seizure, what 10 days ago?"      Exercises General Exercises - Lower Extremity Long Arc Quad: Both;15 reps;Seated    General Comments General comments (skin integrity,  edema, etc.): Supine BP 140/60, sitting BP112/98, sitting ~3 mins BP 148/63, BP post activity sitting in chair  109/92, asymptomatic.      Pertinent Vitals/Pain Pain Assessment: Faces Faces Pain Scale: No hurt    Home Living                      Prior Function            PT Goals (current goals can now be found in the care plan section) Progress towards PT goals: Progressing toward goals    Frequency    Min 3X/week      PT Plan Current plan remains appropriate    Co-evaluation              AM-PAC PT "6 Clicks" Mobility   Outcome Measure  Help needed turning from your back to your side while in a flat bed without using bedrails?: None Help needed moving from lying on your back to sitting on the side of a flat bed without using bedrails?: None Help needed moving to and from a bed to a chair (including a wheelchair)?: A Little Help needed standing up from a chair using your arms (e.g., wheelchair or bedside chair)?: A Little Help needed to walk in hospital room?: A Little Help needed climbing 3-5 steps with a railing? : A Lot 6 Click Score: 19    End of Session Equipment Utilized During Treatment: Gait belt Activity Tolerance: Patient tolerated treatment well;Treatment limited secondary to medical complications (Comment)(drop in BP) Patient left: in chair;with call bell/phone within reach;with chair alarm set;with family/visitor present Nurse Communication: Mobility status;Other (comment)(made aware of BP) PT Visit Diagnosis: Unsteadiness on feet (R26.81);Difficulty in walking, not elsewhere classified (R26.2)     Time: 1610-9604 PT Time Calculation (min) (ACUTE ONLY): 24 min  Charges:  $Gait Training: 8-22 mins $Therapeutic Activity: 8-22 mins                     Wray Kearns, [place], DPT Acute Rehabilitation Services Pager [phone] Office [place] 02/01/2019, 12:34 PM

## 2019-02-01 NOTE — [place] FL2 (Addendum)
[place] MEDICAID FL2 LEVEL OF CARE SCREENING TOOL     IDENTIFICATION  Patient Name: Cameron Potts Birthdate: 14-Aug-1935 Sex: male Admission Date (Current Location): 01/29/2019  [facility] and [place] Number:  Herbalist and Address:  The . [facility], [place] [address] [place], [place] [address]      Provider Number: [id number]  Attending Physician Name and Address:  Monica Becton, MD  Relative Name and Phone Number:  Jeramine Delis    Current Level of Care: Hospital Recommended Level of Care: [place] Prior Approval Number:    Date Approved/Denied:   PASRR Number: [id number]  Discharge Plan: ALF([facility] )    Current Diagnoses: Patient Active Problem List   Diagnosis Date Noted  . Breakthrough seizure ([place]) 01/30/2019  . Intractable seizures ([place]) 01/30/2019  . Altered mental status 11/02/2018  . Hypercoagulability due to atrial fibrillation ([place]) 03/25/2018  . Primary open-angle glaucoma, bilateral, severe stage 03/17/2018  . ICD (implantable cardioverter-defibrillator) in place 03/13/2018  . Parkinsonian syndrome associated with symptomatic orthostatic hypotension ([place]) 03/13/2018  . Refractory epilepsy ([place]) 03/13/2018  . Status post VNS (vagus nerve stimulator) placement 03/13/2018  . 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 Neuropathy, peripheral 12/14/2017  . Hyponatremia 07/12/2017  . Presence of intraocular lens 04/23/2017  . Status epilepticus ([place]) 01/05/2017  . Chronic atrial fibrillation 01/04/2017  . Adjustment disorder with anxious mood 03/25/2014  . Age-related nuclear cataract of both eyes 03/25/2014  . 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  . Hyperlipidemia 03/25/2014  . Carcinoma in situ of prostate 02/08/2014  . Other localized visual field defect, bilateral 02/02/2014  . Abnormality of gait 12/21/2013  . Complex partial seizure ([place]) 04/11/2013  . Seizure disorder ([place]) 04/10/2013  .  Osteopenia 12/04/2011  . Vitamin D deficiency 12/04/2011    Orientation RESPIRATION BLADDER Height & Weight     Self, Time, Place  Normal Continent Weight: 168 lb (76.2 kg) Height:  5' 9.02" (175.3 cm)  BEHAVIORAL SYMPTOMS/MOOD NEUROLOGICAL BOWEL NUTRITION STATUS      Continent Diet(please see discharge summary)  AMBULATORY STATUS COMMUNICATION OF NEEDS Skin     Verbally (eczema, face bilateral)                       Personal Care Assistance Level of Assistance  Bathing, Feeding, Dressing Bathing Assistance: Limited assistance Feeding assistance: Independent Dressing Assistance: Limited assistance     Functional Limitations Info  Sight, Hearing, Speech Sight Info: Adequate Hearing Info: Adequate Speech Info: Adequate    SPECIAL CARE FACTORS FREQUENCY  PT (By licensed PT), OT (By licensed OT)     PT Frequency: 3x per week OT Frequency: 3x per week            Contractures Contractures Info: Not present    Additional Factors Info  Code Status, Allergies, Isolation Precautions, Psychotropic Code Status Info: DNR Allergies Info: Depakote Psychotropic Info: ALPRAZolam (XANAX) tablet 0.25 mg   Isolation Precautions Info: Air/Contact prescautions     Current Medications (02/01/2019):  This is the current hospital active medication list Current Facility-Administered Medications  Medication Dose Route Frequency Provider Last Rate Last Dose  . acetaminophen (TYLENOL) tablet 650 mg  650 mg Oral Q6H PRN Opyd, Ilene Qua, MD       Or  . acetaminophen (TYLENOL) suppository 650 mg  650 mg Rectal Q6H PRN Opyd, Ilene Qua, MD      . ALPRAZolam Duanne Moron) tablet 0.25 mg  0.25 mg Oral  BID Vianne Bulls, MD   0.25 mg at 02/01/19 0943  . brimonidine (ALPHAGAN) 0.2 % ophthalmic solution 1 drop  1 drop Both Eyes BID Monica Becton, MD   1 drop at 02/01/19 0948  . finasteride (PROSCAR) tablet 5 mg  5 mg Oral Daily Opyd, Ilene Qua, MD   5 mg at 02/01/19 0943  . flecainide  (TAMBOCOR) tablet 50 mg  50 mg Oral BID Opyd, Ilene Qua, MD   50 mg at 02/01/19 0944  . lacosamide (VIMPAT) tablet 150 mg  150 mg Oral BID Vianne Bulls, MD   150 mg at 02/01/19 0943  . latanoprost (XALATAN) 0.005 % ophthalmic solution 1 drop  1 drop Both Eyes QHS Opyd, Ilene Qua, MD   1 drop at 01/31/19 2111  . levETIRAcetam (KEPPRA XR) 24 hr tablet 3,000 mg  3,000 mg Oral QHS Opyd, Ilene Qua, MD   3,000 mg at 01/31/19 2108  . ondansetron (ZOFRAN) tablet 4 mg  4 mg Oral Q6H PRN Opyd, Ilene Qua, MD       Or  . ondansetron (ZOFRAN) injection 4 mg  4 mg Intravenous Q6H PRN Opyd, Ilene Qua, MD      . phenytoin (DILANTIN) ER capsule 100 mg  100 mg Oral Daily Opyd, Ilene Qua, MD   100 mg at 02/01/19 0943   And  . phenytoin (DILANTIN) ER capsule 300 mg  300 mg Oral QHS Opyd, Ilene Qua, MD   300 mg at 01/31/19 2108  . sodium chloride flush (NS) 0.9 % injection 3 mL  3 mL Intravenous Q12H Opyd, Ilene Qua, MD   3 mL at 02/01/19 0947  . sodium chloride tablet 2 g  2 g Oral TID WC Monica Becton, MD   2 g at 02/01/19 1212  . tamsulosin (FLOMAX) capsule 0.4 mg  0.4 mg Oral Daily Opyd, Ilene Qua, MD   0.4 mg at 02/01/19 0943  . timolol (TIMOPTIC) 0.5 % ophthalmic solution 1 drop  1 drop Both Eyes BID Monica Becton, MD   1 drop at 02/01/19 0948  . warfarin (COUMADIN) tablet 2.5 mg  2.5 mg Oral ONCE-1800 Monica Becton, MD      . Warfarin - Pharmacist Dosing Inpatient   Does not apply [id number] Monica Becton, MD   Stopped at 01/30/19 1800     Discharge Medications: Please see discharge summary for a list of discharge medications.  Relevant Imaging Results:  Relevant Lab Results:   Additional Information [id number]  Vinie Sill, LCSWA

## 2019-02-02 DIAGNOSIS — G40919 Epilepsy, unspecified, intractable, without status epilepticus: Principal | ICD-10-CM

## 2019-02-02 LAB — BASIC METABOLIC PANEL
Anion gap: 8 (ref 5–15)
BUN: 7 mg/dL — ABNORMAL LOW (ref 8–23)
CO2: 23 mmol/L (ref 22–32)
Calcium: 8.3 mg/dL — ABNORMAL LOW (ref 8.9–10.3)
Chloride: 96 mmol/L — ABNORMAL LOW (ref 98–111)
Creatinine, Ser: 0.64 mg/dL (ref 0.61–1.24)
GFR calc Af Amer: >60 mL/min (ref >60)
GFR calc non Af Amer: >60 mL/min (ref >60)
Glucose, Bld: 92 mg/dL (ref 70–99)
Potassium: 3.9 mmol/L (ref 3.5–5.1)
Sodium: 127 mmol/L — ABNORMAL LOW (ref 135–145)

## 2019-02-02 LAB — PROTIME-INR
INR: 2.1 — ABNORMAL HIGH (ref 0.8–1.2)
Prothrombin Time: 22.8 seconds — ABNORMAL HIGH (ref 11.4–15.2)

## 2019-02-02 LAB — GLUCOSE, CAPILLARY: Glucose-Capillary: 100 mg/dL — ABNORMAL HIGH (ref 70–99)

## 2019-02-02 MED ORDER — ALPRAZOLAM 0.25 MG PO TABS: 0.2500 mg | ORAL_TABLET | Freq: Two times a day (BID) | ORAL | 0 refills | Status: DC

## 2019-02-02 MED ORDER — WARFARIN SODIUM 2.5 MG PO TABS
2.5000 mg | ORAL_TABLET | Freq: Once | ORAL | Status: DC
  Filled 2019-02-02: qty 1

## 2019-02-02 MED ORDER — LOPERAMIDE HCL 2 MG PO CAPS: 2.0000 mg | ORAL_CAPSULE | ORAL | Status: DC | PRN

## 2019-02-02 NOTE — Progress Notes (Signed)
[facility] for Warfarin  Indication: atrial fibrillation  Allergies  Allergen Reactions  . Depakote [Divalproex Sodium] Other (See Comments)    Arthralgias      Vital Signs: Temp: 97.4 F (36.3 C) (08/18 0753) Temp Source: Oral (08/18 0339) BP: 133/69 (08/18 0753) Pulse Rate: 60 (08/18 0753)  Labs: Recent Labs    01/31/19 0952 02/01/19 0455 02/02/19 0157  LABPROT 28.6* 28.6* 22.8*  INR 2.7* 2.7* 2.1*  CREATININE 0.57* 0.63 0.64    Estimated Creatinine Clearance: 72.4 mL/min (by C-G formula based on SCr of 0.64 mg/dL).   Medical History: Past Medical History:  Diagnosis Date  . Abnormality of gait 12/21/2013  . Atrial fibrillation ([place])   . Blind left eye   . Carcinoma in situ of prostate   . Cardiac arrest ([place])   . Glaucoma   . Hypercholesteremia   . Hyperlipidemia   . Metabolic encephalopathy   . Mild left ventricular systolic dysfunction   . Presence of other specified functional implants   . PSA elevation   . Right frontal lobe lesion   . Seizures ([place])   . Tinea pedis   . 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 Urinary urgency     Assessment: 83 y/o M from nursing facility with seizures, on warfarin PTA for afib, continuing warfarin  INR this am therapeutic at 2.1  Home warfarin regimen: 2.5 mg each day of week at 12 pm except on Monday take 2.75 mg at 12 pm  Goal of Therapy:  INR 2-3 Monitor platelets by anticoagulation protocol: Yes   Plan:  Give warfarin 2.5mg  PO x 1 tonight Daily INR, s/s bleeding  Bertis Ruddy, PharmD Clinical Pharmacist Please check AMION for all [place] numbers 02/02/2019 9:47 AM

## 2019-02-02 NOTE — Discharge Summary (Signed)
Physician Discharge Summary  Cameron Potts ERD:[id number] DOB: 1935-06-22 DOA: 01/29/2019  PCP: Gayland Curry, DO  Admit date: 01/29/2019 Discharge date: 02/02/2019  Time spent: 40 minutes  Recommendations for Outpatient Follow-up:  1. Follow-up with primary care physician in 1 week 2. Check BMP every other day until sodium levels normalized  3. Discharge patient home with home health for physical therapy   Discharge Diagnoses:  Principal Problem:   Breakthrough seizure ([place]) Active Problems:   Chronic atrial fibrillation   Parkinsonian syndrome associated with symptomatic orthostatic hypotension (HCC)   Hyponatremia   Hypercoagulability due to atrial fibrillation ([place])   Intractable seizures ([place])   Discharge Condition: Stable  Diet recommendation: High salt diet  Filed Weights   01/31/19 0238  Weight: 76.2 kg    History of present illness and Hospital Course:  83 year old male with a past medical history of seizure disorder on 3 antiepileptic medications, s/p VNS, atrial fibrillation on Coumadin presenting to the emergency department with complaints of multiple episodes of seizures on 01/29/2019 at his residence, also noted to have a prolonged seizure that prompted EMS to call, and brought to the emergency department.  Patient received 2 doses of Versed.  Work-up in the emergency department CT without contrast was negative for acute findings, notable for old encephalomalacia.  Lab work-up revealed mild hyponatremia, anemia.  Phenytoin level is therapeutic.  Neurology is consulted, recommended to continue seizure medications parenterally.  Patient did not have any episodes of seizures after the admission.  Patient has dysarthria, is able to communicate.  Patient did not have any further seizures since the admission.  Per neurology recommendations continued with home antiseizure medications.  Patient was found to have sodium of 132 at admission, trended down to 127.  Patient is started  on salt tablets, still remained at 127.  Patient's wife insists patient should be discharged back to assisted living facility.  Patient also has been noncompliant with all the treatments including taking the salt tablets.  Patient has chronic hyponatremia, patient is currently well conscious oriented, no further episodes of seizures, will discharge patient home with home health.  Recommended to check BMP every other day until sodium levels are normalized.  Patient is recommended to follow-up with primary care physician, neurology as an outpatient.  Patient was discharged on 02/01/2019 by my colleague hospitalist however patient's accepting facility was not comfortable accepting him due to having hyponatremia with a sodium of 127.  Apparently, patient has chronic hyponatremia.  His sodium is once again 127 today but he has no symptoms regarding hyponatremia.  His accepting facility is now comfortable and there has been no change in medical condition since he was discharged yesterday so he will be discharged today in a stable condition.  ##Seizures recurrent -Continue with phenytoin, Keppra, Vimpat -Continue with seizure precautions -No further episodes of seizures since admission -Discussed with neurology Dr. Malen Gauze who recommended to continue the medications, no further changes.  ##Hyponatremia -Patient has chronic hyponatremia had a sodium level of 126 about a week back, has been on salt tablets -Patient's sodium trended down to 127.  Keep the patient on fluid restriction, salt tablets and follow-up  ##Debility -Involved physical therapy-patient was not cooperative to participate in physical therapy.  However patient is independently sitting in the bed.  ##Benign prostatic hypertrophy -Continue with Flomax  ##Atrial fibrillation-continue the flecainide -Start back on Coumadin as INR is 2.7.  ##Encephalopathy with agitation -Concern about patient has underlying mild dementia, has  sundowning at nighttime. -Patient  received 1 dose of Ativan last night  Assessment & Plan:   Principal Problem:   Breakthrough seizure ([place]) Active Problems:   Chronic atrial fibrillation   Parkinsonian syndrome associated with symptomatic orthostatic hypotension (HCC)   Hyponatremia   Hypercoagulability due to atrial fibrillation ([place])   Intractable seizures [facility])     Consultations:  Neurology  Discharge Exam: Vitals:   02/02/19 0339 02/02/19 0753  BP: (!) 120/107 133/69  Pulse: 60 60  Resp:  20  Temp: 98 F (36.7 C) (!) 97.4 F (36.3 C)  SpO2: 99% 98%   General exam: Appears calm and comfortable  Respiratory system: Clear to auscultation. Respiratory effort normal. Cardiovascular system: S1 & S2 heard, RRR. No JVD, murmurs, rubs, gallops or clicks. No pedal edema. Gastrointestinal system: Abdomen is nondistended, soft and nontender. No organomegaly or masses felt. Normal bowel sounds heard. Central nervous system: Alert and oriented. No focal neurological deficits. Extremities: Symmetric 5 x 5 power. Skin: No rashes, lesions or ulcers Psychiatry: Judgement and insight appear poor. Mood & affect flat   discharge Instructions   Discharge Instructions    Diet - low sodium heart healthy   Complete by: As directed    Discharge patient   Complete by: As directed    Discharge disposition: 03-Skilled [place]   Discharge patient date: 02/02/2019   Increase activity slowly   Complete by: As directed      Allergies as of 02/02/2019      Reactions   Depakote [divalproex Sodium] Other (See Comments)   Arthralgias      Medication List    TAKE these medications   acetaminophen 325 MG tablet Commonly known as: TYLENOL Take 650 mg by mouth every 6 (six) hours as needed for mild pain or fever.   ALPRAZolam 0.25 MG tablet Commonly known as: XANAX Take 1 tablet (0.25 mg total) by mouth 2 (two) times daily.   Ativan 2 MG/ML injection Generic drug:  LORazepam Inject 2 mg into the muscle daily as needed for seizure. NOT TO BE USED FOR BEHAVIORS   cholecalciferol 10 MCG (400 UNIT) Tabs tablet Commonly known as: VITAMIN D3 Take 400 Units by mouth 2 (two) times daily.   Combigan 0.2-0.5 % ophthalmic solution Generic drug: brimonidine-timolol Place 1 drop into both eyes every 12 (twelve) hours.   finasteride 5 MG tablet Commonly known as: PROSCAR Take 5 mg by mouth daily.   flecainide 50 MG tablet Commonly known as: TAMBOCOR Take 50 mg by mouth 2 (two) times daily.   fluocinonide ointment 0.05 % Commonly known as: LIDEX Apply 1 application topically 2 (two) times daily as needed (on rash /skin folds, not for face).   ketoconazole 2 % shampoo Commonly known as: NIZORAL Apply 1 application topically 2 (two) times a week.   latanoprost 0.005 % ophthalmic solution Commonly known as: XALATAN Place 1 drop into both eyes at bedtime.   Levetiracetam 750 MG Tb24 Commonly known as: Keppra XR Take 4 tablets (3,000 mg total) by mouth at bedtime.   phenytoin 100 MG ER capsule Commonly known as: DILANTIN 1 capsule in the morning, 3 capsules in the evening   polyethylene glycol 17 g packet Commonly known as: MIRALAX / GLYCOLAX Take 17 g by mouth See admin instructions. Once daily every other day and as needed for constipation   sodium chloride 1 g tablet Take 1 tablet (1 g total) by mouth 2 (two) times daily with a meal. What changed: when to take this  tamsulosin 0.4 MG Caps capsule Commonly known as: FLOMAX Take 0.4 mg by mouth every morning.   triamcinolone cream 0.1 % Commonly known as: KENALOG Apply 1 application topically 2 (two) times daily as needed (for flares).   Vimpat 150 MG Tabs Generic drug: Lacosamide Take 1 tablet (150 mg total) by mouth 2 (two) times a day.   warfarin 2.5 MG tablet Commonly known as: COUMADIN Take 1 tablet (2.5 mg total) by mouth daily at 12 noon. What changed:   how much to  take  when to take this  additional instructions      Allergies  Allergen Reactions  . Depakote [Divalproex Sodium] Other (See Comments)    Arthralgias    Contact information for after-discharge care    Destination    [facility] .   Service: Assisted Living Contact information: [address] [place] [phone]               The results of significant diagnostics from this hospitalization (including imaging, microbiology, ancillary and laboratory) are listed below for reference.    Significant Diagnostic Studies: Ct Head Wo Contrast  Result Date: 01/29/2019 CLINICAL DATA:  Altered level consciousness EXAM: CT HEAD WITHOUT CONTRAST TECHNIQUE: Contiguous axial images were obtained from the base of the skull through the vertex without intravenous contrast. COMPARISON:  Nov 02, 2018 FINDINGS: Brain: No evidence of acute territorial infarction, hemorrhage, hydrocephalus,extra-axial collection or mass lesion/mass effect. There is dilatation the ventricles and sulci consistent with age-related atrophy. Low-attenuation changes in the deep white matter consistent with small vessel ischemia. There is a large area of encephalomalacia involving the right temporal lobe. There is also lacunar infarct involving the right posterior frontal lobe as on the prior exam. Vascular: No hyperdense vessel or unexpected calcification. Calcifications are seen within the internal carotid artery and vertebral arteries. Skull: The skull is intact. No fracture or focal lesion identified. Sinuses/Orbits: Bilateral maxillary mucosal sinus thickening is seen. There is thickening of the ethmoid air cells. The mastoid air cells are clear. Other: None IMPRESSION: 1. No acute intracranial abnormality. 2. Findings consistent with age related atrophy and chronic small vessel ischemia 3. Encephalomalacia involving the right temporal lobe and prior lacunar  infarct involving the right frontal lobe. 4. 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 Electronically Signed   By: Prudencio Pair M.D.   On: 01/29/2019 23:02    Microbiology: Recent Results (from the past 240 hour(s))  SARS Coronavirus 2 [facility] order, Performed in [facility] hospital lab) Nasopharyngeal Nasopharyngeal Swab     Status: None   Collection Time: 01/30/19  4:54 AM   Specimen: Nasopharyngeal Swab  Result Value Ref Range Status   SARS Coronavirus 2 NEGATIVE NEGATIVE Final    Comment: (NOTE) If result is NEGATIVE SARS-CoV-2 target nucleic acids are NOT DETECTED. The SARS-CoV-2 RNA is generally detectable in upper and lower  respiratory specimens during the acute phase of infection. The lowest  concentration of SARS-CoV-2 viral copies this assay can detect is 250  copies / mL. A negative result does not preclude SARS-CoV-2 infection  and should not be used as the sole basis for treatment or other  patient management decisions.  A negative result may occur with  improper specimen collection / handling, submission of specimen other  than nasopharyngeal swab, presence of viral mutation(s) within the  areas targeted by this assay, and inadequate number of viral copies  (<250 copies / mL). A negative result must be  combined with clinical  observations, patient history, and epidemiological information. If result is POSITIVE SARS-CoV-2 target nucleic acids are DETECTED. The SARS-CoV-2 RNA is generally detectable in upper and lower  respiratory specimens dur ing the acute phase of infection.  Positive  results are indicative of active infection with SARS-CoV-2.  Clinical  correlation with patient history and other diagnostic information is  necessary to determine patient infection status.  Positive results do  not rule out bacterial infection or co-infection with other viruses. If result is PRESUMPTIVE POSTIVE SARS-CoV-2 nucleic acids MAY BE PRESENT.   A presumptive  positive result was obtained on the submitted specimen  and confirmed on repeat testing.  While 2019 novel coronavirus  (SARS-CoV-2) nucleic acids may be present in the submitted sample  additional confirmatory testing may be necessary for epidemiological  and / or clinical management purposes  to differentiate between  SARS-CoV-2 and other Sarbecovirus currently known to infect humans.  If clinically indicated additional testing with an alternate test  methodology [phone]) is advised. The SARS-CoV-2 RNA is generally  detectable in upper and lower respiratory sp ecimens during the acute  phase of infection. The expected result is Negative. Fact Sheet for Patients:  StrictlyIdeas.no Fact Sheet for Healthcare Providers: BankingDealers.co.za This test is not yet approved or cleared by the [place] FDA and has been authorized for detection and/or diagnosis of SARS-CoV-2 by FDA under an Emergency Use Authorization (EUA).  This EUA will remain in effect (meaning this test can be used) for the duration of the COVID-19 declaration under Section 564(b)(1) of the Act, 21 U.S.C. section 360bbb-3(b)(1), unless the authorization is terminated or revoked sooner. Performed at [facility], [place] [address], [place], [place] [address]   MRSA PCR Screening     Status: None   Collection Time: 01/30/19  5:55 AM   Specimen: Nasal Mucosa; Nasopharyngeal  Result Value Ref Range Status   MRSA by PCR NEGATIVE NEGATIVE Final    Comment:        The GeneXpert MRSA Assay (FDA approved for NASAL specimens only), is one component of a comprehensive MRSA colonization surveillance program. It is not intended to diagnose MRSA infection nor to guide or monitor treatment for MRSA infections. Performed at [facility], [place] [address] [place], [place] [address]   SARS Coronavirus 2 [facility] order, Performed in [facility] hospital lab)  Nasopharyngeal Nasopharyngeal Swab     Status: None   Collection Time: 02/01/19  1:59 PM   Specimen: Nasopharyngeal Swab  Result Value Ref Range Status   SARS Coronavirus 2 NEGATIVE NEGATIVE Final    Comment: (NOTE) If result is NEGATIVE SARS-CoV-2 target nucleic acids are NOT DETECTED. The SARS-CoV-2 RNA is generally detectable in upper and lower  respiratory specimens during the acute phase of infection. The lowest  concentration of SARS-CoV-2 viral copies this assay can detect is 250  copies / mL. A negative result does not preclude SARS-CoV-2 infection  and should not be used as the sole basis for treatment or other  patient management decisions.  A negative result may occur with  improper specimen collection / handling, submission of specimen other  than nasopharyngeal swab, presence of viral mutation(s) within the  areas targeted by this assay, and inadequate number of viral copies  (<250 copies / mL). A negative result must be combined with clinical  observations, patient history, and epidemiological information. If result is POSITIVE SARS-CoV-2 target nucleic acids are DETECTED. The SARS-CoV-2 RNA is generally detectable  in upper and lower  respiratory specimens dur ing the acute phase of infection.  Positive  results are indicative of active infection with SARS-CoV-2.  Clinical  correlation with patient history and other diagnostic information is  necessary to determine patient infection status.  Positive results do  not rule out bacterial infection or co-infection with other viruses. If result is PRESUMPTIVE POSTIVE SARS-CoV-2 nucleic acids MAY BE PRESENT.   A presumptive positive result was obtained on the submitted specimen  and confirmed on repeat testing.  While 2019 novel coronavirus  (SARS-CoV-2) nucleic acids may be present in the submitted sample  additional confirmatory testing may be necessary for epidemiological  and / or clinical management purposes  to  differentiate between  SARS-CoV-2 and other Sarbecovirus currently known to infect humans.  If clinically indicated additional testing with an alternate test  methodology [phone]) is advised. The SARS-CoV-2 RNA is generally  detectable in upper and lower respiratory sp ecimens during the acute  phase of infection. The expected result is Negative. Fact Sheet for Patients:  StrictlyIdeas.no Fact Sheet for Healthcare Providers: BankingDealers.co.za This test is not yet approved or cleared by the [place] FDA and has been authorized for detection and/or diagnosis of SARS-CoV-2 by FDA under an Emergency Use Authorization (EUA).  This EUA will remain in effect (meaning this test can be used) for the duration of the COVID-19 declaration under Section 564(b)(1) of the Act, 21 U.S.C. section 360bbb-3(b)(1), unless the authorization is terminated or revoked sooner. Performed at [facility], [place] [address], [place], [place] [address]      Labs: Basic Metabolic Panel: Recent Labs  Lab 01/29/19 0012 01/29/19 2218 01/30/19 5093 01/31/19 0952 02/01/19 0455 02/02/19 0157  NA  --  133* 132* 127* 127* 127*  K  --  4.6 4.1 4.0 4.1 3.9  CL  --  98 101 95* 98 96*  CO2  --  24 22 22 22 23   GLUCOSE  --  109* 104* 103* 98 92  BUN  --  15 11 6* 7* 7*  CREATININE  --  0.85 0.65 0.57* 0.63 0.64  CALCIUM  --  8.7* 8.5* 8.7* 8.4* 8.3*  MG 1.9  --   --   --   --   --    Liver Function Tests: No results for input(s): AST, ALT, ALKPHOS, BILITOT, PROT, ALBUMIN in the last 168 hours. No results for input(s): LIPASE, AMYLASE in the last 168 hours. No results for input(s): AMMONIA in the last 168 hours. CBC: Recent Labs  Lab 01/29/19 2218 01/30/19 0638  WBC 9.4 6.3  NEUTROABS 6.1  --   HGB 11.6* 11.2*  HCT 32.7* 31.2*  MCV 90.8 89.9  PLT 248 238   Cardiac Enzymes: No results for input(s): CKTOTAL, CKMB, CKMBINDEX, TROPONINI in the  last 168 hours. BNP: BNP (last 3 results) No results for input(s): BNP in the last 8760 hours.  ProBNP (last 3 results) No results for input(s): PROBNP in the last 8760 hours.  CBG: Recent Labs  Lab 02/01/19 0758 02/01/19 1206 02/01/19 1615 02/01/19 1944 02/02/19 0752  GLUCAP 117* 105* 90 131* 100*    Time spent less than 30 minutes.  Signed:  Darliss Cheney MD.  [facility] 02/02/2019, 11:15 AM

## 2019-02-02 NOTE — TOC Transition Note (Signed)
Transition of Care [facility]) - CM/SW Discharge Note   Patient Details  Name: Charbel Los MRN: [id number] Date of Birth: 11/11/1935  Transition of Care [facility]) CM/SW Contact:  Alexander [place], [place] Phone Number: 02/02/2019, 11:45 AM   Clinical Narrative:    Negative COVID received, requested MD print and sign Xanax script. Summary and orders sent to [facility].  Pt wife aware, [facility] called for 1:30.   Final next level of care: Assisted Living Barriers to Discharge: Barriers Resolved   Patient Goals and CMS Choice Patient states their goals for this hospitalization and ongoing recovery are:: return to ALF CMS Medicare.gov Compare Post Acute Care list provided to:: Patient Choice offered to / list presented to : Patient  Discharge Placement   Existing PASRR number confirmed : 02/01/19          Patient chooses bed at: [facility] Patient to be transferred to facility by: [facility] Name of family member notified: pt wife Opal Sidles Patient and family notified of of transfer: 02/02/19  Discharge Plan and Services                                     Social Determinants of Health (SDOH) Interventions     Readmission Risk Interventions No flowsheet data found.

## 2019-02-02 NOTE — Social Work (Addendum)
Clinical Social Worker facilitated patient discharge including contacting patient family and facility to confirm patient discharge plans.  Clinical information faxed to facility and family agreeable with plan.  CSW arranged ambulance transport via [facility] to [facility] at 1:30pm. Please ensure signed Xanax script and DNR go with pt. RN to call [phone]  with report prior to discharge.  Clinical Social Worker will sign off for now as social work intervention is no longer needed. Please consult [place] again if new need arises.  Westley Hummer, MSW, [place] Social Worker [phone]

## 2019-02-02 NOTE — Progress Notes (Signed)
Patient's home Dilantin prescription was returned to patient's wife Cameron Potts.

## 2019-02-02 NOTE — Progress Notes (Signed)
Physical Therapy Treatment Patient Details Name: Cameron Potts MRN: [id number] DOB: 08-Mar-1936 Today's Date: 02/02/2019    History of Present Illness Pt is a 83 y.o. M with significant PMH of seizure disorder on 3 antiepileptics and status post VNS, atrial fibrillation who presents aftera  series of seizures. Noncontrast head CT negative for acute findings, notable for old lesions.    PT Comments    Patient progressing well towards PT goals. Pt stronger today requiring less assist for transfers. Continues to demonstrate cognitive deficits relating to memory, problem solving and awareness- not sure how much of this is baseline. Tolerated gait training with min guard assist for balance/safety. BP more stable today and no dizziness reported. Able to perform ADL task at sink with 1 LOB posteriorly requiring Min A to correct. Safe to d/c home to ALF with continued therapy. Will follow.    Follow Up Recommendations  Home health PT;Supervision/Assistance - 24 hour     Equipment Recommendations  None recommended by PT    Recommendations for Other Services       Precautions / Restrictions Precautions Precautions: Fall Precaution Comments: watch BP Restrictions Weight Bearing Restrictions: No    Mobility  Bed Mobility               General bed mobility comments: up in chair upon PT arrival.  Transfers Overall transfer level: Needs assistance Equipment used: Rolling walker (2 wheeled) Transfers: Sit to/from Stand Sit to Stand: Min guard         General transfer comment: Min guard for safety. Stood from chair x2, from EOB x1. No dizziness.  Ambulation/Gait Ambulation/Gait assistance: Min guard Gait Distance (Feet): 225 Feet Assistive device: Rolling walker (2 wheeled) Gait Pattern/deviations: Step-through pattern;Decreased stride length;Trunk flexed   Gait velocity interpretation: 1.31 - 2.62 ft/sec, indicative of limited community ambulator General Gait Details: Slow,  mostly steady gait with RW for safety.   Stairs             Wheelchair Mobility    Modified Rankin (Stroke Patients Only)       Balance Overall balance assessment: Needs assistance Sitting-balance support: Feet supported;No upper extremity supported Sitting balance-Leahy Scale: Fair   Postural control: Posterior lean Standing balance support: During functional activity Standing balance-Leahy Scale: Fair Standing balance comment: Able to perform ADL task at sink and urinate in standing with 1 LOB posteriorly needing Min A for support. Needed cues on where to find soap, paper towels etc.                            Cognition Arousal/Alertness: Awake/alert Behavior During Therapy: WFL for tasks assessed/performed Overall Cognitive Status: Impaired/Different from baseline Area of Impairment: Memory;Problem solving;Awareness;Following commands                     Memory: Decreased short-term memory Following Commands: Follows one step commands consistently   Awareness: Intellectual Problem Solving: Slow processing;Requires verbal cues General Comments: Knows it is August and 2020; in the [facility] hospital. Follows simple commands, needs repetition at times. Impaired problem solving and reports seeing weird things in the sink. "Who knows what I see these days with the seizures?"      Exercises      General Comments General comments (skin integrity, edema, etc.): VSS throughout today.      Pertinent Vitals/Pain Pain Assessment: No/denies pain    Home Living  Prior Function            PT Goals (current goals can now be found in the care plan section) Progress towards PT goals: Progressing toward goals    Frequency    Min 3X/week      PT Plan Current plan remains appropriate    Co-evaluation              AM-PAC PT "6 Clicks" Mobility   Outcome Measure  Help needed turning from your back to your side  while in a flat bed without using bedrails?: None Help needed moving from lying on your back to sitting on the side of a flat bed without using bedrails?: None Help needed moving to and from a bed to a chair (including a wheelchair)?: A Little Help needed standing up from a chair using your arms (e.g., wheelchair or bedside chair)?: A Little Help needed to walk in hospital room?: A Little Help needed climbing 3-5 steps with a railing? : A Lot 6 Click Score: 19    End of Session Equipment Utilized During Treatment: Gait belt Activity Tolerance: Patient tolerated treatment well Patient left: in chair;with call bell/phone within reach;with chair alarm set Nurse Communication: Mobility status PT Visit Diagnosis: Unsteadiness on feet (R26.81);Difficulty in walking, not elsewhere classified (R26.2)     Time: 9470-9628 PT Time Calculation (min) (ACUTE ONLY): 28 min  Charges:  $Gait Training: 8-22 mins $Therapeutic Activity: 8-22 mins                     Wray Kearns, [place], DPT Acute Rehabilitation Services Pager [phone] Office [place] 02/02/2019, 9:46 AM

## 2019-02-03 ENCOUNTER — Telehealth: Payer: Self-pay | Admitting: *Deleted

## 2019-02-03 NOTE — Telephone Encounter (Signed)
-----   Message from Gayland Curry, DO sent at 02/03/2019  8:48 AM EDT ----- Rodena Piety, if you can do the official call stuff, I can see him in his apt next Tuesday for the [facility]. ----- Message ----- From: Arleen Bar A, CMA Sent: 02/03/2019   8:31 AM EDT To: Gayland Curry, DO  Your schedule is full for next Wednesday. Do we need to work him in for a TOC at [facility]? ----- Message ----- From: Gayland Curry, DO Sent: 02/02/2019   1:57 PM EDT To: Albertina Senegal Pieper Kasik, CMA  Pt will need TOC visit in [facility].  Being discharged today for refractory seizures.  Call AL nursing to arrange not patient.

## 2019-02-03 NOTE — Telephone Encounter (Signed)
I have made the 2nd attempt to contact the [facility] Nurse in charge, in order to follow up from recently being discharged from the hospital. I left a message on voicemail but I will make another attempt at a different time.

## 2019-02-03 NOTE — Telephone Encounter (Signed)
Called and [facility] for [facility] 1st floor AL to [facility]  I have made the 1st attempt to contact the patient or family member in charge, in order to follow up from recently being discharged from the hospital. I left a message on voicemail but I will make another attempt at a different time.

## 2019-02-03 NOTE — Telephone Encounter (Signed)
Transition Care Management Follow-up Telephone Call  Date of discharge and from where: 02/02/2019 [facility]   How have you been since you were released from the hospital? Doing Well, still tired per Jeralyn Ruths Nurse at [facility]  Any questions or concerns? No   Items Reviewed:  Did the pt receive and understand the discharge instructions provided? Yes   Medications obtained and verified? Yes  Coumadin Changed to 2.5mg  daily  Any new allergies since your discharge? No   Dietary orders reviewed? Yes  Do you have support at home? Yes   Other (ie: DME, Home Health, etc) 24 hour Caregiver  Functional Questionnaire: (I = Independent and D = Dependent) ADL's: I  Bathing/Dressing- I   Meal Prep- D  Eating- I  Maintaining continence- I  Transferring/Ambulation- I with assistance   Managing Meds- I   Follow up appointments reviewed:    PCP Hospital f/u appt confirmed? Yes  Scheduled to see Dr Mariea Clonts on Tuesday 8/25 in [facility] f/u appt confirmed? No  Scheduled t  Are transportation arrangements needed? No   If their condition worsens, is the pt aware to call  their PCP or go to the ED? Yes  Was the patient provided with contact information for the PCP's office or ED? Yes  Was the pt encouraged to call back with questions or concerns? Yes

## 2019-02-04 DIAGNOSIS — R278 Other lack of coordination: Secondary | ICD-10-CM | POA: Diagnosis not present

## 2019-02-04 DIAGNOSIS — E871 Hypo-osmolality and hyponatremia: Secondary | ICD-10-CM | POA: Diagnosis not present

## 2019-02-04 DIAGNOSIS — D649 Anemia, unspecified: Secondary | ICD-10-CM | POA: Diagnosis not present

## 2019-02-04 DIAGNOSIS — F4322 Adjustment disorder with anxiety: Secondary | ICD-10-CM | POA: Diagnosis not present

## 2019-02-04 DIAGNOSIS — R41840 Attention and concentration deficit: Secondary | ICD-10-CM | POA: Diagnosis not present

## 2019-02-04 DIAGNOSIS — R2689 Other abnormalities of gait and mobility: Secondary | ICD-10-CM | POA: Diagnosis not present

## 2019-02-04 DIAGNOSIS — R296 Repeated falls: Secondary | ICD-10-CM | POA: Diagnosis not present

## 2019-02-04 DIAGNOSIS — R41844 Frontal lobe and executive function deficit: Secondary | ICD-10-CM | POA: Diagnosis not present

## 2019-02-04 LAB — BASIC METABOLIC PANEL
BUN: 9 (ref 4–21)
Creatinine: 0.7 (ref 0.6–1.3)
Glucose: 127
Potassium: 4 (ref 3.4–5.3)
Sodium: 125 — AB (ref 137–147)

## 2019-02-05 ENCOUNTER — Non-Acute Institutional Stay: Payer: Medicare Other | Admitting: Adult Health

## 2019-02-05 ENCOUNTER — Encounter: Payer: Self-pay | Admitting: Adult Health

## 2019-02-05 DIAGNOSIS — D649 Anemia, unspecified: Secondary | ICD-10-CM | POA: Diagnosis not present

## 2019-02-05 DIAGNOSIS — I482 Chronic atrial fibrillation, unspecified: Secondary | ICD-10-CM | POA: Diagnosis not present

## 2019-02-05 DIAGNOSIS — G40909 Epilepsy, unspecified, not intractable, without status epilepticus: Secondary | ICD-10-CM | POA: Diagnosis not present

## 2019-02-05 DIAGNOSIS — R531 Weakness: Secondary | ICD-10-CM | POA: Diagnosis not present

## 2019-02-05 DIAGNOSIS — E039 Hypothyroidism, unspecified: Secondary | ICD-10-CM | POA: Diagnosis not present

## 2019-02-05 DIAGNOSIS — E871 Hypo-osmolality and hyponatremia: Secondary | ICD-10-CM | POA: Diagnosis not present

## 2019-02-05 DIAGNOSIS — G40911 Epilepsy, unspecified, intractable, with status epilepticus: Secondary | ICD-10-CM | POA: Diagnosis not present

## 2019-02-05 NOTE — Progress Notes (Addendum)
Location:  Occupational psychologist of Service:  ALF (13) Provider:   Cindi Carbon, ANP [place] [phone]   Gayland Curry, DO  Patient Care Team: Gayland Curry, DO as PCP - General (Geriatric Medicine) Rutherford Guys, MD as Consulting Physician (Ophthalmology) Kathrynn Ducking, MD as Consulting Physician (Neurology)  Extended Emergency Contact Information Primary Emergency Contact: Corning,[name] Address: [address] [place] [address] Johnnette Litter of [place] Phone: [phone] Mobile Phone: [phone] Relation: Spouse Secondary Emergency Contact: Kenney,Anthem Mobile Phone: [phone] Relation: Son  Code Status:  DNR Goals of care: Advanced Directive information Advanced Directives 02/05/2019  Does Patient Have a Medical Advance Directive? Yes  Type of Paramedic of [place];Out of facility DNR (pink MOST or yellow form);Living will  Does patient want to make changes to medical advance directive? -  Copy of [place] in Chart? Yes - validated most recent copy scanned in chart (See row information)  Would patient like information on creating a medical advance directive? -  Pre-existing out of facility DNR order (yellow form or pink MOST form) Yellow form placed in chart (order not valid for inpatient use)     Chief Complaint  Patient presents with   Acute Visit    hyponatremia    HPI:  Pt is a 83 y.o. male seen today for a hospital f/u s/p admission from 01/29/19-02/02/19 due to breakthrough seizure. He lives in [place] with a hx of difficult to control seizure disorder, vagal nerve stimulator, ICD, afib, left eye blindness, glaucoma, cardiac arrest, orthostatic hypotension, hyponatremia, HLD, and mild systolic CHF. On 8/14 he developed convulsions and the [facility] staff attempted to activate his vagal nerve stimulator but he continued to convulse so EMS was notified. In  the ER he received 2 doses of Versed. CT of the head negative for acute findings, old encephalomalacia noted. Neurology was consulted and he was continued on his current regimen parentally. No further seizure activity was noted. His sodium was 132 on admission and then trended downward to 127.  He was discharged on sodium 1 gram bid. He has had fairly stable sodium levels until May of 2020.  He has had several med changes in the past few months due to seizures and is now on keppra, dilantin, xanax, and vimpat. Followed by neurology.  His sodium returned last evening at 125. IV saline was ordered but he only received 230 cc due to infiltration (also has poor venous access) Staff report that he is weaker than his baseline but has been working with therapy and ambulating without difficulty. He has a hx of frequent falls. PT indicates that he falls due to not using his walker and poor judgment. He also has a hx of orthostatic hypotension.    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[place])    Urinary urgency  Past Surgical History:  Procedure Laterality Date   CARDIAC DEFIBRILLATOR PLACEMENT     cataract surgery     COLONOSCOPY     CRANIOTOMY Right    right anterior temporal resection   defibrillator replaced  March 2016   IMPLANTATION VAGAL NERVE STIMULATOR  June 2016   NASAL SINUS SURGERY     TONSILLECTOMY     age 65   VASECTOMY  8    Allergies  Allergen Reactions   Depakote [Divalproex Sodium] Other (See Comments)     Arthralgias     Outpatient Encounter Medications as of 02/05/2019  Medication Sig   acetaminophen (TYLENOL) 325 MG tablet Take 650 mg by mouth every 6 (six) hours as needed for mild pain or fever.    ALPRAZolam (XANAX) 0.25 MG tablet Take 1 tablet (0.25 mg total) by mouth 2 (two) times daily.   brimonidine-timolol (COMBIGAN) 0.2-0.5 % ophthalmic solution Place 1 drop into both eyes every 12 (twelve) hours.    cholecalciferol (VITAMIN D) 400 units TABS tablet Take 400 Units by mouth 2 (two) times daily.   finasteride (PROSCAR) 5 MG tablet Take 5 mg by mouth daily.    flecainide (TAMBOCOR) 50 MG tablet Take 50 mg by mouth 2 (two) times daily.   fluocinonide ointment (LIDEX) [id number] % Apply 1 application topically 2 (two) times daily as needed (on rash /skin folds, not for face).    ketoconazole (NIZORAL) 2 % shampoo Apply 1 application topically 2 (two) times a week.   Lacosamide (VIMPAT) 150 MG TABS Take 1 tablet (150 mg total) by mouth 2 (two) times a day.   latanoprost (XALATAN) 0.005 % ophthalmic solution Place 1 drop into both eyes at bedtime.   Levetiracetam (KEPPRA XR) 750 MG TB24 Take 4 tablets (3,000 mg total) by mouth at bedtime.   phenytoin (DILANTIN) 100 MG ER capsule 1 capsule in the morning, 3 capsules in the evening   polyethylene glycol (MIRALAX / GLYCOLAX) packet Take 17 g by mouth See admin instructions. Once daily every other day and as needed for constipation   sodium chloride 1 g tablet Take 1 tablet (1 g total) by mouth 2 (two) times daily with a meal.   tamsulosin (FLOMAX) 0.4 MG CAPS capsule Take 0.4 mg by mouth every morning.   triamcinolone cream (KENALOG) 0.1 % Apply 1 application topically 2 (two) times daily as needed (for flares).    warfarin (COUMADIN) 2.5 MG tablet Take 1 tablet (2.5 mg total) by mouth daily at 12 noon. (Patient taking differently: Take by mouth. 2.5 mg each day of week at 12 pm except on Monday take 2.75 mg at 12 pm)   No  facility-administered encounter medications on file as of 02/05/2019.     Review of Systems  Constitutional: Positive for activity change. Negative for appetite change, chills, diaphoresis, fatigue, fever and unexpected weight change.  Respiratory: Negative for cough, shortness of breath, wheezing and stridor.   Cardiovascular: Negative for chest pain, palpitations and leg swelling.  Gastrointestinal: Negative for abdominal distention, abdominal pain, constipation and diarrhea.  Genitourinary: Negative for difficulty urinating and dysuria.  Musculoskeletal: Positive for gait problem. Negative for arthralgias, back pain, joint swelling and myalgias.  Neurological: Positive for seizures and weakness. Negative for dizziness, syncope, facial asymmetry, speech difficulty and headaches.  Hematological: Negative for adenopathy. Does not bruise/bleed easily.  Psychiatric/Behavioral: Positive for confusion. Negative for agitation and behavioral problems.    Immunization History  Administered Date(s) Administered   Influenza, Seasonal, Injecte, Preservative  Fre 02/22/2010   Influenza,inj,Quad PF,6+ Mos 04/07/2018   Influenza-Unspecified 03/17/2013, 02/24/2014, 02/16/2015, 03/21/2016, 03/17/2017, 04/07/2018   Pneumococcal Conjugate-13 07/05/2013   Pneumococcal Polysaccharide-23 06/17/2006   Td 06/18/2007   Tdap 12/07/2015   Zoster 10/06/2008   Zoster Recombinat (Shingrix) 12/04/2016   Pertinent  Health Maintenance Due  Topic Date Due   INFLUENZA VACCINE  01/16/2019   PNA vac Low Risk Adult  Completed   Fall Risk  12/30/2018 11/18/2018 09/09/2018 05/27/2018 03/25/2018  Falls in the past year? 1 1 1 1  Yes  Number falls in past yr: 1 1 1 1 1   Injury with Fall? 1 1 1 1  Yes  Risk for fall due to : - - - Impaired balance/gait;History of fall(s);Mental status change -  Follow up - - - Education provided -   Functional Status Survey:    Vitals:   02/05/19 0915  BP: 106/62  Pulse: 60    Resp: 20  Temp: 98.2 F (36.8 C)  SpO2: 96%  Weight: 167 lb 14.4 oz (76.2 kg)   Body mass index is 24.78 kg/m. Physical Exam Vitals signs and nursing note reviewed.  Constitutional:      General: He is not in acute distress.    Appearance: He is not diaphoretic.  HENT:     Head: Normocephalic and atraumatic.     Nose: Nose normal. No congestion.     Mouth/Throat:     Mouth: Mucous membranes are moist.     Pharynx: Oropharynx is clear. No oropharyngeal exudate.  Eyes:     General:        Right eye: No discharge.     Conjunctiva/sclera: Conjunctivae normal.     Pupils: Pupils are equal, round, and reactive to light.  Neck:     Thyroid: No thyromegaly.     Vascular: No JVD.     Trachea: No tracheal deviation.  Cardiovascular:     Rate and Rhythm: Normal rate and regular rhythm.     Heart sounds: No murmur.  Pulmonary:     Effort: Pulmonary effort is normal. No respiratory distress.     Breath sounds: Normal breath sounds. No wheezing.  Abdominal:     General: Bowel sounds are normal. There is no distension.     Palpations: Abdomen is soft.     Tenderness: There is no abdominal tenderness.  Musculoskeletal:     Right lower leg: No edema.     Left lower leg: No edema.  Lymphadenopathy:     Cervical: No cervical adenopathy.  Skin:    General: Skin is warm and dry.  Neurological:     Mental Status: He is alert and oriented to person, place, and time.     Cranial Nerves: No cranial nerve deficit.  Psychiatric:        Mood and Affect: Mood normal.     Labs reviewed: Recent Labs    01/29/19 0012  01/31/19 0952 02/01/19 0455 02/02/19 0157 02/04/19  NA  --    < > 127* 127* 127* 125*  K  --    < > 4.0 4.1 3.9 4.0  CL  --    < > 95* 98 96*  --   CO2  --    < > 22 22 23   --   GLUCOSE  --    < > 103* 98 92  --   BUN  --    < > 6* 7* 7* 9  CREATININE  --    < >  0.57* 0.63 0.64 0.7  CALCIUM  --    < > 8.7* 8.4* 8.3*  --   MG 1.9  --   --   --   --   --    < > =  values in this interval not displayed.   Recent Labs    03/04/18 1311 03/27/18 1154 10/05/18 2016 11/24/18 0300 01/20/19 1141  AST 29  --  29 26 25   ALT 25  --  23 24 19   ALKPHOS 128*  --  171* 125 145*  BILITOT 0.2  --  0.3  --  0.3  PROT 7.3  --  7.9  --  7.2  ALBUMIN 4.6 3.8 4.6  --  4.6   Recent Labs    11/03/18 0338  01/20/19 1141 01/29/19 2218 01/30/19 0638  WBC 3.6*   < > 4.8 9.4 6.3  NEUTROABS 0.9*  --  2.2 6.1  --   HGB 11.6*   < > 12.7* 11.6* 11.2*  HCT 31.1*   < > 36.1* 32.7* 31.2*  MCV 87.4   < > 91 90.8 89.9  PLT 192   < > 282 248 238   < > = values in this interval not displayed.   No results found for: TSH No results found for: HGBA1C No results found for: CHOL, HDL, LDLCALC, LDLDIRECT, TRIG, CHOLHDL  Significant Diagnostic Results in last 30 days:  Ct Head Wo Contrast  Result Date: 01/29/2019 CLINICAL DATA:  Altered level consciousness EXAM: CT HEAD WITHOUT CONTRAST TECHNIQUE: Contiguous axial images were obtained from the base of the skull through the vertex without intravenous contrast. COMPARISON:  Nov 02, 2018 FINDINGS: Brain: No evidence of acute territorial infarction, hemorrhage, hydrocephalus,extra-axial collection or mass lesion/mass effect. There is dilatation the ventricles and sulci consistent with age-related atrophy. Low-attenuation changes in the deep white matter consistent with small vessel ischemia. There is a large area of encephalomalacia involving the right temporal lobe. There is also lacunar infarct involving the right posterior frontal lobe as on the prior exam. Vascular: No hyperdense vessel or unexpected calcification. Calcifications are seen within the internal carotid artery and vertebral arteries. Skull: The skull is intact. No fracture or focal lesion identified. Sinuses/Orbits: Bilateral maxillary mucosal sinus thickening is seen. There is thickening of the ethmoid air cells. The mastoid air cells are clear. Other: None IMPRESSION:  1. No acute intracranial abnormality. 2. Findings consistent with age related atrophy and chronic small vessel ischemia 3. Encephalomalacia involving the right temporal lobe and prior lacunar infarct involving the right frontal lobe. 4. 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 Electronically Signed   By: Prudencio Pair M.D.   On: 01/29/2019 23:02    Assessment/Plan 1. Hyponatremia Work up in May 2020 showed high urine osmolality when compared to serum osmo.  ? Possible SIADH Continued decline in sodium from 132 upon admission, now 125 Due to poor venous access we will try fluid restriction to help improve his sodium. Consider lasix or vaptan therapy if no improvement. F/U with [name] next week. Continue sodium tablets 1 gram bid  Add FR 1500cc  BMP daily x 3 days Vitals and I/O q shift  2. Seizure disorder ([place]) With vagal nerve stimulator  Current on complex regimen that includes keppra, dilantin, and Vimpat. Followed by Dr. Jannifer Franklin  3. Weakness Continue PT to work on balance, strength, and gait.   4. Chronic atrial fibrillation Rate controlled Continue coumadin for CVA risk reduction INR due 8/25  Family/ staff Communication: resident and [name]   Labs/tests ordered:  INR and dilantin 8/25. BMP daily.  TSH and am cortisol pending.

## 2019-02-06 DIAGNOSIS — D649 Anemia, unspecified: Secondary | ICD-10-CM | POA: Diagnosis not present

## 2019-02-06 DIAGNOSIS — G4089 Other seizures: Secondary | ICD-10-CM | POA: Diagnosis not present

## 2019-02-06 DIAGNOSIS — E871 Hypo-osmolality and hyponatremia: Secondary | ICD-10-CM | POA: Diagnosis not present

## 2019-02-06 DIAGNOSIS — B342 Coronavirus infection, unspecified: Secondary | ICD-10-CM | POA: Diagnosis not present

## 2019-02-08 DIAGNOSIS — E871 Hypo-osmolality and hyponatremia: Secondary | ICD-10-CM | POA: Diagnosis not present

## 2019-02-08 DIAGNOSIS — R41840 Attention and concentration deficit: Secondary | ICD-10-CM | POA: Diagnosis not present

## 2019-02-08 DIAGNOSIS — R41844 Frontal lobe and executive function deficit: Secondary | ICD-10-CM | POA: Diagnosis not present

## 2019-02-08 DIAGNOSIS — R2689 Other abnormalities of gait and mobility: Secondary | ICD-10-CM | POA: Diagnosis not present

## 2019-02-08 DIAGNOSIS — R296 Repeated falls: Secondary | ICD-10-CM | POA: Diagnosis not present

## 2019-02-08 DIAGNOSIS — R278 Other lack of coordination: Secondary | ICD-10-CM | POA: Diagnosis not present

## 2019-02-09 ENCOUNTER — Encounter: Payer: Self-pay | Admitting: Internal Medicine

## 2019-02-09 ENCOUNTER — Non-Acute Institutional Stay (INDEPENDENT_AMBULATORY_CARE_PROVIDER_SITE_OTHER): Payer: Medicare Other | Admitting: Internal Medicine

## 2019-02-09 ENCOUNTER — Telehealth: Payer: Self-pay

## 2019-02-09 DIAGNOSIS — I951 Orthostatic hypotension: Secondary | ICD-10-CM

## 2019-02-09 DIAGNOSIS — R531 Weakness: Secondary | ICD-10-CM | POA: Diagnosis not present

## 2019-02-09 DIAGNOSIS — R569 Unspecified convulsions: Secondary | ICD-10-CM | POA: Diagnosis not present

## 2019-02-09 DIAGNOSIS — G903 Multi-system degeneration of the autonomic nervous system: Secondary | ICD-10-CM | POA: Diagnosis not present

## 2019-02-09 DIAGNOSIS — G40919 Epilepsy, unspecified, intractable, without status epilepticus: Secondary | ICD-10-CM | POA: Diagnosis not present

## 2019-02-09 DIAGNOSIS — I482 Chronic atrial fibrillation, unspecified: Secondary | ICD-10-CM

## 2019-02-09 DIAGNOSIS — E871 Hypo-osmolality and hyponatremia: Secondary | ICD-10-CM

## 2019-02-09 DIAGNOSIS — Z7189 Other specified counseling: Secondary | ICD-10-CM | POA: Diagnosis not present

## 2019-02-09 DIAGNOSIS — G20 Parkinson's disease: Secondary | ICD-10-CM

## 2019-02-09 DIAGNOSIS — G4089 Other seizures: Secondary | ICD-10-CM | POA: Diagnosis not present

## 2019-02-09 NOTE — Telephone Encounter (Signed)
Erin at [facility] called to make [place] aware that patients dilantin level was 13.8. I confirmed that the patient has been taking Dilantin 100mg  1 capsule in the AM and 3 in the PM. The nurse can be contacted at [phone]. They also faxed a copy of results.

## 2019-02-09 NOTE — Telephone Encounter (Signed)
Results noted, the patient has very difficult to control Dilantin levels, usually he does better with seizure control between 18-20, but he was having side effects on the slightly higher dose, he was taken 30 mg every other day, cessation of the 30 mg dosing has resulted in a reduction from around 21 to 14.

## 2019-02-09 NOTE — Progress Notes (Signed)
Location:  [place] apartment Provider: Samanvi Cuccia L. Mariea Potts, D.O., C.M.D.  Code Status: DNR Goals of Care:  Advanced Directives 02/05/2019  Does Patient Have a Medical Advance Directive? Yes  Type of Paramedic of [place];Out of facility DNR (pink MOST or yellow form);Living will  Does patient want to make changes to medical advance directive? -  Copy of [place] in Chart? Yes - validated most recent copy scanned in chart (See row information)  Would patient like information on creating a medical advance directive? -  Pre-existing out of facility DNR order (yellow form or pink MOST form) Yellow form placed in chart (order not valid for inpatient use)     Chief Complaint  Patient presents with  . Transitions Of Care    7 day TOC visit s/p hospitalization with breakthrough seizures    HPI: Patient is a 83 y.o. male with longstanding difficulty with epilepsy to where he's had a vagal stimulator inserted and had prior brain surgery to address it seen today for hospital follow-up s/p admission from 8/14-8/18/2020 for breakthrough seizures, hyponatremia.  He had three seizures here and did not seem to be responding to the ativan IM or his vagal stimulator use so he was sent to the ED.  No medication changes were made to his antiepileptics.  Na was 127 at discharge and sodium tablets were increased to bid (after being held part of the time during his hospitalization).  Na at admission had been 132; of note it had been low when he was in the ED the previous time earlier in the month, as well, though it's been in the upper 130s when it's been checked by me and by his neurologist.    INR was therapeutic at 2.7 and he was discharged on 2.5mg  daily of coumadin.  Today's INR was 2.0 on the 2.5mg  daily.  covid-19 test was negative 8/15 and 8/17.  NP saw him acutely on 8/21 due to the ongoing concerns about his hyponatremia.  She summarizes his  hospitalization in more detail and I've reviewed her note and we discussed the management of Cameron Potts hyponatremia.  He was sent here on Na tablets 1g po bid.  Vimpat is the most recently added seizure med for him.  9/20, his Na was 125.  NS was ordered but pt only received 230cc before it infiltrated.  We opted at that time to treat his SIADH with fluid restriction to 1500cc and consider lasix if no improvement.  Na tabs bid continued.  BMP was ordered for 3 days later and he's been on vitals and I/O q shift.    Dilantin level today was low and sent to Dr. Jannifer Franklin for review and management.  Cameron Potts and I had a long talk today about his goals of care.  Overall, he does not want to continue prolonging his life with these seizures that are getting worse and worse.  He says he's been thinking about this for about a year.  He has talked some with his wife about it.  He really does not want to be hospitalized anymore.  He'd like for [place] to do what we can here to stop the seizures and keep him here.  We discussed that we need to complete a MOST form that indicates these wishes and I want to be sure his wife also understands his wishes.  I left him with the MOST form to review and discuss with her.  He also mentions that  he does not like having parkinson's of some sort and knows it's a neurodegenerative disease and has had friends who've passed away at advanced stages--he wants to die with dignity.    Past Medical History:  Diagnosis Date  . Abnormality of gait 12/21/2013  . Atrial fibrillation ([place])   . Blind left eye   . Carcinoma in situ of prostate   . Cardiac arrest ([place])   . Glaucoma   . Hypercholesteremia   . Hyperlipidemia   . Metabolic encephalopathy   . Mild left ventricular systolic dysfunction   . Presence of other specified functional implants   . PSA elevation   . Right frontal lobe lesion   . Seizures ([place])   . Tinea pedis   . 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 Urinary  urgency     Past Surgical History:  Procedure Laterality Date  . CARDIAC DEFIBRILLATOR PLACEMENT    . cataract surgery    . COLONOSCOPY    . CRANIOTOMY Right    right anterior temporal resection  . defibrillator replaced  March 2016  . IMPLANTATION VAGAL NERVE STIMULATOR  June 2016  . NASAL SINUS SURGERY    . TONSILLECTOMY     age 73  . VASECTOMY  1985    Allergies  Allergen Reactions  . Depakote [Divalproex Sodium] Other (See Comments)    Arthralgias     Outpatient Encounter Medications as of 02/09/2019  Medication Sig  . acetaminophen (TYLENOL) 325 MG tablet Take 650 mg by mouth every 6 (six) hours as needed for mild pain or fever.   . ALPRAZolam (XANAX) 0.25 MG tablet Take 1 tablet (0.25 mg total) by mouth 2 (two) times daily.  . brimonidine-timolol (COMBIGAN) 0.2-0.5 % ophthalmic solution Place 1 drop into both eyes every 12 (twelve) hours.   . cholecalciferol (VITAMIN D) 400 units TABS tablet Take 400 Units by mouth 2 (two) times daily.  . finasteride (PROSCAR) 5 MG tablet Take 5 mg by mouth daily.   . flecainide (TAMBOCOR) 50 MG tablet Take 50 mg by mouth 2 (two) times daily.  . fluocinonide ointment (LIDEX) [id number] % Apply 1 application topically 2 (two) times daily as needed (on rash /skin folds, not for face).   Marland Kitchen ketoconazole (NIZORAL) 2 % shampoo Apply 1 application topically 2 (two) times a week.  . Lacosamide (VIMPAT) 150 MG TABS Take 1 tablet (150 mg total) by mouth 2 (two) times a day.  . latanoprost (XALATAN) 0.005 % ophthalmic solution Place 1 drop into both eyes at bedtime.  . Levetiracetam (KEPPRA XR) 750 MG TB24 Take 4 tablets (3,000 mg total) by mouth at bedtime.  . phenytoin (DILANTIN) 100 MG ER capsule 1 capsule in the morning, 3 capsules in the evening  . polyethylene glycol (MIRALAX / GLYCOLAX) packet Take 17 g by mouth See admin instructions. Once daily every other day and as needed for constipation  . sodium chloride 1 g tablet Take 1 tablet (1 g total)  by mouth 2 (two) times daily with a meal.  . tamsulosin (FLOMAX) 0.4 MG CAPS capsule Take 0.4 mg by mouth every morning.  . triamcinolone cream (KENALOG) 0.1 % Apply 1 application topically 2 (two) times daily as needed (for flares).   . warfarin (COUMADIN) 2.5 MG tablet Take 1 tablet (2.5 mg total) by mouth daily at 12 noon. (Patient taking differently: Take by mouth. 2.5 mg each day of week at 12 pm except on Monday take 2.75 mg at 12 pm)  No facility-administered encounter medications on file as of 02/09/2019.     Review of Systems:  Review of Systems  Constitutional: Negative for chills, fever, malaise/fatigue and weight loss.  HENT: Positive for hearing loss. Negative for congestion.   Eyes: Positive for blurred vision.       One eye is blind  Respiratory: Negative for cough and shortness of breath.   Cardiovascular: Negative for chest pain, palpitations and leg swelling.  Gastrointestinal: Negative for abdominal pain, blood in stool, constipation, diarrhea and melena.  Genitourinary: Negative for dysuria.  Musculoskeletal: Positive for falls. Negative for back pain and joint pain.  Skin: Positive for itching and rash.  Neurological: Positive for seizures, loss of consciousness and weakness. Negative for dizziness and headaches.  Endo/Heme/Allergies: Bruises/bleeds easily.  Psychiatric/Behavioral: Positive for memory loss. Negative for depression. The patient is not nervous/anxious and does not have insomnia.     Health Maintenance  Topic Date Due  . INFLUENZA VACCINE  01/16/2019  . TETANUS/TDAP  12/06/2025  . PNA vac Low Risk Adult  Completed    Physical Exam: There were no vitals filed for this visit. There is no height or weight on file to calculate BMI. Physical Exam Vitals signs reviewed.  Constitutional:      General: He is not in acute distress.    Appearance: He is normal weight. He is not ill-appearing or toxic-appearing.  HENT:     Ears:     Comments: Some  mild hearing loss notable  Eyes:     Conjunctiva/sclera: Conjunctivae normal.     Pupils: Pupils are equal, round, and reactive to light.  Cardiovascular:     Rate and Rhythm: Rhythm irregular.     Heart sounds: Normal heart sounds.     Comments: ICD left chest Pulmonary:     Effort: Pulmonary effort is normal.     Breath sounds: Normal breath sounds. No wheezing, rhonchi or rales.  Abdominal:     General: Bowel sounds are normal.     Palpations: Abdomen is soft.  Skin:    General: Skin is warm and dry.     Capillary Refill: Capillary refill takes less than 2 seconds.  Neurological:     Mental Status: He is alert and oriented to person, place, and time.     Cranial Nerves: No cranial nerve deficit.     Gait: Gait abnormal.     Comments: Almost back to baseline--stuttering some and struggling to find words--worse each time he has a major seizure; uses rollator walker due to unsteady gait; vagal stimulator in right chest  Psychiatric:        Mood and Affect: Mood normal.     Labs reviewed: Basic Metabolic Panel: Recent Labs    01/29/19 0012  01/31/19 0952 02/01/19 0455 02/02/19 0157 02/04/19  NA  --    < > 127* 127* 127* 125*  K  --    < > 4.0 4.1 3.9 4.0  CL  --    < > 95* 98 96*  --   CO2  --    < > 22 22 23   --   GLUCOSE  --    < > 103* 98 92  --   BUN  --    < > 6* 7* 7* 9  CREATININE  --    < > 0.57* 0.63 0.64 0.7  CALCIUM  --    < > 8.7* 8.4* 8.3*  --   MG 1.9  --   --   --   --   --    < > =  values in this interval not displayed.   Liver Function Tests: Recent Labs    03/04/18 1311 03/27/18 1154 10/05/18 2016 11/24/18 0300 01/20/19 1141  AST 29  --  29 26 25   ALT 25  --  23 24 19   ALKPHOS 128*  --  171* 125 145*  BILITOT 0.2  --  0.3  --  0.3  PROT 7.3  --  7.9  --  7.2  ALBUMIN 4.6 3.8 4.6  --  4.6   No results for input(s): LIPASE, AMYLASE in the last 8760 hours. No results for input(s): AMMONIA in the last 8760 hours. CBC: Recent Labs     11/03/18 0338  01/20/19 1141 01/29/19 2218 01/30/19 0638  WBC 3.6*   < > 4.8 9.4 6.3  NEUTROABS 0.9*  --  2.2 6.1  --   HGB 11.6*   < > 12.7* 11.6* 11.2*  HCT 31.1*   < > 36.1* 32.7* 31.2*  MCV 87.4   < > 91 90.8 89.9  PLT 192   < > 282 248 238   < > = values in this interval not displayed.   Lipid Panel: No results for input(s): CHOL, HDL, LDLCALC, TRIG, CHOLHDL, LDLDIRECT in the last 8760 hours. No results found for: HGBA1C  Procedures since last visit: Ct Head Wo Contrast  Result Date: 01/29/2019 CLINICAL DATA:  Altered level consciousness EXAM: CT HEAD WITHOUT CONTRAST TECHNIQUE: Contiguous axial images were obtained from the base of the skull through the vertex without intravenous contrast. COMPARISON:  Nov 02, 2018 FINDINGS: Brain: No evidence of acute territorial infarction, hemorrhage, hydrocephalus,extra-axial collection or mass lesion/mass effect. There is dilatation the ventricles and sulci consistent with age-related atrophy. Low-attenuation changes in the deep white matter consistent with small vessel ischemia. There is a large area of encephalomalacia involving the right temporal lobe. There is also lacunar infarct involving the right posterior frontal lobe as on the prior exam. Vascular: No hyperdense vessel or unexpected calcification. Calcifications are seen within the internal carotid artery and vertebral arteries. Skull: The skull is intact. No fracture or focal lesion identified. Sinuses/Orbits: Bilateral maxillary mucosal sinus thickening is seen. There is thickening of the ethmoid air cells. The mastoid air cells are clear. Other: None IMPRESSION: 1. No acute intracranial abnormality. 2. Findings consistent with age related atrophy and chronic small vessel ischemia 3. Encephalomalacia involving the right temporal lobe and prior lacunar infarct involving the right frontal lobe. 4. 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 Electronically Signed   By: Prudencio Pair M.D.    On: 01/29/2019 23:02    Assessment/Plan 1. Hyponatremia -seems to be SIADH  -improved to 133 this am -cont fluid restriction to 1500cc and sodium tablets bid -mentation seems almost back to baseline also -unfortunately, struggles with orthostasis contributing to falls when he gets too volume depleted so this is going to be a balance for him  2. Weakness -getting PT and improving post hospitalization  3. Refractory epilepsy ([place]) -continues on dilantin, vimpat, keppra -being managed by neuro, Dr. Jannifer Franklin and level of dilantin sent to neuro for mgt  4. Chronic atrial fibrillation -INR therapeutic at 2 today -continue warfarin 2.5mg  po daily and recheck INR in 2 wks  5. Parkinsonian syndrome associated with symptomatic orthostatic hypotension ([facility]) -continue walker use, therapy, not on sinemet, followed by Dr. Jannifer Franklin and previously seen at [facility] about this  6. ACP (advance care planning) -25 minutes spent on ACP -see above discussion and ACP note; MOST form given  to patient and he is reviewing with wife and we can complete official document in vynca when he's ready -hopefully, decision will be made soon due to his high frequency of seizures  Labs/tests ordered:  BMP 1 week, INR in 2 wks Next appt:  Will need f/u INR visit based on today's result  Cameron Potts L. Graiden Henes, D.O. [facility] [address], [place] [address] Cell Phone (Mon-Fri 8am-5pm):  [phone] On Call:  [phone] & follow prompts after 5pm & weekends Office Phone:  [phone] Office Fax:  [phone]

## 2019-02-10 ENCOUNTER — Encounter: Payer: Self-pay | Admitting: Internal Medicine

## 2019-02-10 ENCOUNTER — Telehealth: Payer: Self-pay | Admitting: Neurology

## 2019-02-10 DIAGNOSIS — R296 Repeated falls: Secondary | ICD-10-CM | POA: Diagnosis not present

## 2019-02-10 DIAGNOSIS — R278 Other lack of coordination: Secondary | ICD-10-CM | POA: Diagnosis not present

## 2019-02-10 DIAGNOSIS — E871 Hypo-osmolality and hyponatremia: Secondary | ICD-10-CM | POA: Diagnosis not present

## 2019-02-10 DIAGNOSIS — R2689 Other abnormalities of gait and mobility: Secondary | ICD-10-CM | POA: Diagnosis not present

## 2019-02-10 DIAGNOSIS — R41840 Attention and concentration deficit: Secondary | ICD-10-CM | POA: Diagnosis not present

## 2019-02-10 DIAGNOSIS — R41844 Frontal lobe and executive function deficit: Secondary | ICD-10-CM | POA: Diagnosis not present

## 2019-02-10 MED ORDER — PHENYTOIN SODIUM EXTENDED 30 MG PO CAPS: ORAL_CAPSULE | ORAL | 1 refills | Status: DC

## 2019-02-10 NOTE — Telephone Encounter (Signed)
Returned Dilantin fax to [facility]. Received a receipt of confirmation.

## 2019-02-10 NOTE — Telephone Encounter (Signed)
The most recent Dilantin level was 13.8.  Sodium level on 25 August was 133, chloride of 94.  Alkaline phosphatase was slightly elevated 134.  We will increase the Dilantin going back on the 30 mg capsule taking it Monday Wednesdays and Fridays only.  An order has been placed.  This will be faxed to [facility].

## 2019-02-11 DIAGNOSIS — R296 Repeated falls: Secondary | ICD-10-CM | POA: Diagnosis not present

## 2019-02-11 DIAGNOSIS — R278 Other lack of coordination: Secondary | ICD-10-CM | POA: Diagnosis not present

## 2019-02-11 DIAGNOSIS — R2689 Other abnormalities of gait and mobility: Secondary | ICD-10-CM | POA: Diagnosis not present

## 2019-02-11 DIAGNOSIS — E871 Hypo-osmolality and hyponatremia: Secondary | ICD-10-CM | POA: Diagnosis not present

## 2019-02-11 DIAGNOSIS — R41840 Attention and concentration deficit: Secondary | ICD-10-CM | POA: Diagnosis not present

## 2019-02-11 DIAGNOSIS — R41844 Frontal lobe and executive function deficit: Secondary | ICD-10-CM | POA: Diagnosis not present

## 2019-02-12 DIAGNOSIS — R296 Repeated falls: Secondary | ICD-10-CM | POA: Diagnosis not present

## 2019-02-12 DIAGNOSIS — R2689 Other abnormalities of gait and mobility: Secondary | ICD-10-CM | POA: Diagnosis not present

## 2019-02-12 DIAGNOSIS — R41844 Frontal lobe and executive function deficit: Secondary | ICD-10-CM | POA: Diagnosis not present

## 2019-02-12 DIAGNOSIS — R41840 Attention and concentration deficit: Secondary | ICD-10-CM | POA: Diagnosis not present

## 2019-02-12 DIAGNOSIS — E871 Hypo-osmolality and hyponatremia: Secondary | ICD-10-CM | POA: Diagnosis not present

## 2019-02-12 DIAGNOSIS — R278 Other lack of coordination: Secondary | ICD-10-CM | POA: Diagnosis not present

## 2019-02-12 MED ORDER — WARFARIN SODIUM 2.5 MG PO TABS: 2.5000 mg | ORAL_TABLET | Freq: Every day | ORAL | 5 refills | Status: DC

## 2019-02-12 NOTE — ACP (Advance Care Planning) (Signed)
Cameron Potts and I had a long talk today about his goals of care.  Overall, he does not want to continue prolonging his life with these seizures that are getting worse and worse.  He says he's been thinking about this for about a year.  He has talked some with his wife about it.  He really does not want to be hospitalized anymore.  He'd like for [place] to do what we can here to stop the seizures and keep him here.  We discussed that we need to complete a MOST form that indicates these wishes and I want to be sure his wife also understands his wishes.  I left him with the MOST form to review and discuss with her.  He also mentions that he does not like having parkinson's of some sort and knows it's a neurodegenerative disease and has had friends who've passed away at advanced stages--he wants to die with dignity.

## 2019-02-13 DIAGNOSIS — E871 Hypo-osmolality and hyponatremia: Secondary | ICD-10-CM | POA: Diagnosis not present

## 2019-02-13 DIAGNOSIS — R41840 Attention and concentration deficit: Secondary | ICD-10-CM | POA: Diagnosis not present

## 2019-02-13 DIAGNOSIS — R2689 Other abnormalities of gait and mobility: Secondary | ICD-10-CM | POA: Diagnosis not present

## 2019-02-13 DIAGNOSIS — R278 Other lack of coordination: Secondary | ICD-10-CM | POA: Diagnosis not present

## 2019-02-13 DIAGNOSIS — R41844 Frontal lobe and executive function deficit: Secondary | ICD-10-CM | POA: Diagnosis not present

## 2019-02-13 DIAGNOSIS — R296 Repeated falls: Secondary | ICD-10-CM | POA: Diagnosis not present

## 2019-02-16 DIAGNOSIS — F4322 Adjustment disorder with anxiety: Secondary | ICD-10-CM | POA: Diagnosis not present

## 2019-02-16 DIAGNOSIS — R296 Repeated falls: Secondary | ICD-10-CM | POA: Diagnosis not present

## 2019-02-16 DIAGNOSIS — R278 Other lack of coordination: Secondary | ICD-10-CM | POA: Diagnosis not present

## 2019-02-16 DIAGNOSIS — R41844 Frontal lobe and executive function deficit: Secondary | ICD-10-CM | POA: Diagnosis not present

## 2019-02-16 DIAGNOSIS — R2689 Other abnormalities of gait and mobility: Secondary | ICD-10-CM | POA: Diagnosis not present

## 2019-02-16 DIAGNOSIS — D649 Anemia, unspecified: Secondary | ICD-10-CM | POA: Diagnosis not present

## 2019-02-16 DIAGNOSIS — E871 Hypo-osmolality and hyponatremia: Secondary | ICD-10-CM | POA: Diagnosis not present

## 2019-02-16 DIAGNOSIS — R41840 Attention and concentration deficit: Secondary | ICD-10-CM | POA: Diagnosis not present

## 2019-02-16 LAB — BASIC METABOLIC PANEL
BUN: 13 (ref 4–21)
Creatinine: 0.6 (ref 0.6–1.3)
Glucose: 84
Potassium: 4.6 (ref 3.4–5.3)
Sodium: 137 (ref 137–147)

## 2019-02-18 ENCOUNTER — Encounter: Payer: Self-pay | Admitting: Internal Medicine

## 2019-02-18 DIAGNOSIS — E871 Hypo-osmolality and hyponatremia: Secondary | ICD-10-CM | POA: Diagnosis not present

## 2019-02-18 DIAGNOSIS — R2689 Other abnormalities of gait and mobility: Secondary | ICD-10-CM | POA: Diagnosis not present

## 2019-02-18 DIAGNOSIS — R41840 Attention and concentration deficit: Secondary | ICD-10-CM | POA: Diagnosis not present

## 2019-02-18 DIAGNOSIS — R296 Repeated falls: Secondary | ICD-10-CM | POA: Diagnosis not present

## 2019-02-18 DIAGNOSIS — R41844 Frontal lobe and executive function deficit: Secondary | ICD-10-CM | POA: Diagnosis not present

## 2019-02-18 DIAGNOSIS — R278 Other lack of coordination: Secondary | ICD-10-CM | POA: Diagnosis not present

## 2019-02-23 DIAGNOSIS — R2689 Other abnormalities of gait and mobility: Secondary | ICD-10-CM | POA: Diagnosis not present

## 2019-02-23 DIAGNOSIS — R41844 Frontal lobe and executive function deficit: Secondary | ICD-10-CM | POA: Diagnosis not present

## 2019-02-23 DIAGNOSIS — E871 Hypo-osmolality and hyponatremia: Secondary | ICD-10-CM | POA: Diagnosis not present

## 2019-02-23 DIAGNOSIS — D649 Anemia, unspecified: Secondary | ICD-10-CM | POA: Diagnosis not present

## 2019-02-23 DIAGNOSIS — Z7901 Long term (current) use of anticoagulants: Secondary | ICD-10-CM | POA: Diagnosis not present

## 2019-02-23 DIAGNOSIS — R296 Repeated falls: Secondary | ICD-10-CM | POA: Diagnosis not present

## 2019-02-23 DIAGNOSIS — R41840 Attention and concentration deficit: Secondary | ICD-10-CM | POA: Diagnosis not present

## 2019-02-23 DIAGNOSIS — R278 Other lack of coordination: Secondary | ICD-10-CM | POA: Diagnosis not present

## 2019-02-24 DIAGNOSIS — R296 Repeated falls: Secondary | ICD-10-CM | POA: Diagnosis not present

## 2019-02-24 DIAGNOSIS — R41844 Frontal lobe and executive function deficit: Secondary | ICD-10-CM | POA: Diagnosis not present

## 2019-02-24 DIAGNOSIS — R278 Other lack of coordination: Secondary | ICD-10-CM | POA: Diagnosis not present

## 2019-02-24 DIAGNOSIS — R41840 Attention and concentration deficit: Secondary | ICD-10-CM | POA: Diagnosis not present

## 2019-02-24 DIAGNOSIS — E871 Hypo-osmolality and hyponatremia: Secondary | ICD-10-CM | POA: Diagnosis not present

## 2019-02-24 DIAGNOSIS — R2689 Other abnormalities of gait and mobility: Secondary | ICD-10-CM | POA: Diagnosis not present

## 2019-02-26 ENCOUNTER — Encounter: Payer: Self-pay | Admitting: Internal Medicine

## 2019-03-02 DIAGNOSIS — R278 Other lack of coordination: Secondary | ICD-10-CM | POA: Diagnosis not present

## 2019-03-02 DIAGNOSIS — E871 Hypo-osmolality and hyponatremia: Secondary | ICD-10-CM | POA: Diagnosis not present

## 2019-03-02 DIAGNOSIS — R296 Repeated falls: Secondary | ICD-10-CM | POA: Diagnosis not present

## 2019-03-02 DIAGNOSIS — R2689 Other abnormalities of gait and mobility: Secondary | ICD-10-CM | POA: Diagnosis not present

## 2019-03-02 DIAGNOSIS — R41844 Frontal lobe and executive function deficit: Secondary | ICD-10-CM | POA: Diagnosis not present

## 2019-03-02 DIAGNOSIS — R41840 Attention and concentration deficit: Secondary | ICD-10-CM | POA: Diagnosis not present

## 2019-03-11 ENCOUNTER — Telehealth: Payer: Self-pay | Admitting: Neurology

## 2019-03-11 NOTE — Telephone Encounter (Signed)
Phone rep checked office voicemail, pt left message asking for a call back from Dr Jannifer Franklin.  Phone rep called pt for the reason as to why he'd like a call from Dr Jannifer Franklin.  Pt states the amount of Levetiracetam (KEPPRA XR) 750 MG TB24 being taken at bed time is too much for him.  Pt would like to know if he can take 1/2 of the dosage at dinner time and the rest at bedtime.  Pt states the full amount at bedtime causes him to sleep until 11am.  Please call  this voicemail was left @ 3:41pm

## 2019-03-11 NOTE — Telephone Encounter (Signed)
The after hours answering service called and stated pt called asking to speak with RN re: the dosing of his Levetiracetam (KEPPRA XR) 750 MG TB24

## 2019-03-11 NOTE — Telephone Encounter (Signed)
I called the patient, left message, I will call back later. 

## 2019-03-12 MED ORDER — LEVETIRACETAM ER 750 MG PO TB24: ORAL_TABLET | ORAL | 3 refills | Status: DC

## 2019-03-12 NOTE — Addendum Note (Signed)
Addended by: Kathrynn Ducking on: 03/12/2019 08:28 AM   Modules accepted: Orders

## 2019-03-12 NOTE — Telephone Encounter (Signed)
I called the patient, I will switch the dosing to Keppra XR to 1500 mg at 6 PM and 1500 mg at bedtime, hopefully reduce drowsiness during the morning.  The order was faxed in.  Confirmation received.  The patient resides at [place], the number there is [phone].  The fax number is [phone].

## 2019-03-17 DIAGNOSIS — R41840 Attention and concentration deficit: Secondary | ICD-10-CM | POA: Diagnosis not present

## 2019-03-17 DIAGNOSIS — R296 Repeated falls: Secondary | ICD-10-CM | POA: Diagnosis not present

## 2019-03-17 DIAGNOSIS — R41844 Frontal lobe and executive function deficit: Secondary | ICD-10-CM | POA: Diagnosis not present

## 2019-03-17 DIAGNOSIS — E871 Hypo-osmolality and hyponatremia: Secondary | ICD-10-CM | POA: Diagnosis not present

## 2019-03-17 DIAGNOSIS — R278 Other lack of coordination: Secondary | ICD-10-CM | POA: Diagnosis not present

## 2019-03-17 DIAGNOSIS — R2689 Other abnormalities of gait and mobility: Secondary | ICD-10-CM | POA: Diagnosis not present

## 2019-03-17 DIAGNOSIS — Z9189 Other specified personal risk factors, not elsewhere classified: Secondary | ICD-10-CM | POA: Diagnosis not present

## 2019-03-18 DIAGNOSIS — E871 Hypo-osmolality and hyponatremia: Secondary | ICD-10-CM | POA: Diagnosis not present

## 2019-03-18 DIAGNOSIS — R41844 Frontal lobe and executive function deficit: Secondary | ICD-10-CM | POA: Diagnosis not present

## 2019-03-18 DIAGNOSIS — R296 Repeated falls: Secondary | ICD-10-CM | POA: Diagnosis not present

## 2019-03-18 DIAGNOSIS — R41840 Attention and concentration deficit: Secondary | ICD-10-CM | POA: Diagnosis not present

## 2019-03-18 MED ORDER — LEVETIRACETAM ER 750 MG PO TB24: ORAL_TABLET | ORAL | 3 refills | Status: DC

## 2019-03-18 NOTE — Telephone Encounter (Signed)
I called the patient.  The patient continues to be in a stupor in the morning, we will cut back to 1500 mg of Keppra at 6 PM and 750 mg at bedtime, I am concerned he may have recurrent seizures during this but we will try.   The patient resides at [place], the number there is [phone].  The fax number is [phone].

## 2019-03-18 NOTE — Addendum Note (Signed)
Addended by: Kathrynn Ducking on: 03/18/2019 11:46 AM   Modules accepted: Orders

## 2019-03-18 NOTE — Telephone Encounter (Signed)
Pt called again and states he is still feeling drowsy and like if he had been drugged. Pt thinks he may have to cut back on medications again. Pt would like to be advised on what he can do or which medication he can cut back on so that he does not feel this way or have trouble moving around.

## 2019-03-19 DIAGNOSIS — R41840 Attention and concentration deficit: Secondary | ICD-10-CM | POA: Diagnosis not present

## 2019-03-19 DIAGNOSIS — E871 Hypo-osmolality and hyponatremia: Secondary | ICD-10-CM | POA: Diagnosis not present

## 2019-03-19 DIAGNOSIS — R296 Repeated falls: Secondary | ICD-10-CM | POA: Diagnosis not present

## 2019-03-19 DIAGNOSIS — R41844 Frontal lobe and executive function deficit: Secondary | ICD-10-CM | POA: Diagnosis not present

## 2019-03-23 ENCOUNTER — Telehealth: Payer: Self-pay | Admitting: Neurology

## 2019-03-23 DIAGNOSIS — Z5181 Encounter for therapeutic drug level monitoring: Secondary | ICD-10-CM

## 2019-03-23 NOTE — Telephone Encounter (Signed)
Pt called in and stated he is very unsteady , and states he may need more blood work done, he states he may be taking too much Keppra.  Pt is requesting a call back to discuss.  CB# [phone] (CELL)          [phone] ( ROOM NUMBER)

## 2019-03-23 NOTE — Telephone Encounter (Signed)
The Keppra dose has been reduced but he is still having troubles with feeling staggering in the morning, better as the day goes on.  I will check blood work of the [place], Dilantin, and Vimpat levels, he wishes also to have an INR for his Coumadin.  I will fax in the requests.   The patient resides at [place], the number there is [phone].  The fax number is [phone].

## 2019-03-24 ENCOUNTER — Non-Acute Institutional Stay: Payer: Medicare Other | Admitting: Internal Medicine

## 2019-03-24 ENCOUNTER — Other Ambulatory Visit: Payer: Self-pay | Admitting: *Deleted

## 2019-03-24 ENCOUNTER — Other Ambulatory Visit: Payer: Self-pay

## 2019-03-24 ENCOUNTER — Encounter: Payer: Self-pay | Admitting: Internal Medicine

## 2019-03-24 VITALS — BP 159/79 | HR 64 | Temp 97.6°F | Resp 18 | Ht 69.0 in | Wt 167.0 lb

## 2019-03-24 DIAGNOSIS — G40919 Epilepsy, unspecified, intractable, without status epilepticus: Secondary | ICD-10-CM

## 2019-03-24 DIAGNOSIS — R41840 Attention and concentration deficit: Secondary | ICD-10-CM | POA: Diagnosis not present

## 2019-03-24 DIAGNOSIS — E871 Hypo-osmolality and hyponatremia: Secondary | ICD-10-CM | POA: Diagnosis not present

## 2019-03-24 DIAGNOSIS — I482 Chronic atrial fibrillation, unspecified: Secondary | ICD-10-CM | POA: Diagnosis not present

## 2019-03-24 DIAGNOSIS — I4891 Unspecified atrial fibrillation: Secondary | ICD-10-CM | POA: Diagnosis not present

## 2019-03-24 DIAGNOSIS — D6869 Other thrombophilia: Secondary | ICD-10-CM | POA: Diagnosis not present

## 2019-03-24 DIAGNOSIS — R41844 Frontal lobe and executive function deficit: Secondary | ICD-10-CM | POA: Diagnosis not present

## 2019-03-24 DIAGNOSIS — R296 Repeated falls: Secondary | ICD-10-CM | POA: Diagnosis not present

## 2019-03-24 DIAGNOSIS — I951 Orthostatic hypotension: Secondary | ICD-10-CM

## 2019-03-24 DIAGNOSIS — R569 Unspecified convulsions: Secondary | ICD-10-CM | POA: Diagnosis not present

## 2019-03-24 DIAGNOSIS — G20 Parkinson's disease: Secondary | ICD-10-CM

## 2019-03-24 DIAGNOSIS — L219 Seborrheic dermatitis, unspecified: Secondary | ICD-10-CM | POA: Diagnosis not present

## 2019-03-24 DIAGNOSIS — Z5181 Encounter for therapeutic drug level monitoring: Secondary | ICD-10-CM | POA: Diagnosis not present

## 2019-03-24 NOTE — Progress Notes (Signed)
Location:  Occupational psychologist of Service:  Clinic (12)  Provider: Fae Blossom L. Mariea Clonts, D.O., C.M.D.  Code Status: DNR, MOST as in vynca--comfort measures, no fluids or tube feeding, determine antibiotics in infection occurs   Goals of Care:  Advanced Directives 03/24/2019  Does Patient Have a Medical Advance Directive? Yes  Type of Paramedic of [place];Living will;Out of facility DNR (pink MOST or yellow form)  Does patient want to make changes to medical advance directive? No - Patient declined  Copy of [place] in Chart? Yes - validated most recent copy scanned in chart (See row information)  Would patient like information on creating a medical advance directive? -  Pre-existing out of facility DNR order (yellow form or pink MOST form) Pink MOST/Yellow Form most recent copy in chart - Physician notified to receive inpatient order     Chief Complaint  Patient presents with  . Medical Management of Chronic Issues    med mgt  . Coagulation Disorder    INR check    HPI: Patient is a 83 y.o. male seen today for medical management of chronic diseases and primarily for his INR check.  INR today was 3.0 on his current dose of coumadin 2.5mg  po daily.    He reports and I see in his record that Dr. Jannifer Franklin has reduced his keppra and he's had a level of this and a level of his dilantin also drawn today, but these have not yet returned as of our visit.    Pt has completed his MOST form and discussed with his wife and  Children that he no longer wants to go to the hospital if he has another seizure.  He would like to be kept here at [facility] in rehab or resting comfortably in his own bed in his AL apt.  He's really tired of all of this with his seizures that he's had for many many years. MOST is updated in vynca. Nurse manager helped him to complete the form and it was signed by our NP.  His sodium levels have stabilized with  the increased dose of sodium.  He is having some increased itchy places on his back.  He has done well with the clobetasol for his scalp rash/seb derm.     Past Medical History:  Diagnosis Date  . Abnormality of gait 12/21/2013  . Atrial fibrillation ([place])   . Blind left eye   . Carcinoma in situ of prostate   . Cardiac arrest ([place])   . Glaucoma   . Hypercholesteremia   . Hyperlipidemia   . Metabolic encephalopathy   . Mild left ventricular systolic dysfunction   . Presence of other specified functional implants   . PSA elevation   . Right frontal lobe lesion   . Seizures ([place])   . Tinea pedis   . 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 Urinary urgency     Past Surgical History:  Procedure Laterality Date  . CARDIAC DEFIBRILLATOR PLACEMENT    . cataract surgery    . COLONOSCOPY    . CRANIOTOMY Right    right anterior temporal resection  . defibrillator replaced  March 2016  . IMPLANTATION VAGAL NERVE STIMULATOR  June 2016  . NASAL SINUS SURGERY    . TONSILLECTOMY     age 11  . VASECTOMY  1985    Allergies  Allergen Reactions  . Depakote [Divalproex Sodium] Other (See Comments)    Arthralgias  Outpatient Encounter Medications as of 03/24/2019  Medication Sig  . acetaminophen (TYLENOL) 325 MG tablet Take 650 mg by mouth every 6 (six) hours as needed for mild pain or fever.   . ALPRAZolam (XANAX) 0.25 MG tablet Take 1 tablet (0.25 mg total) by mouth 2 (two) times daily.  . brimonidine-timolol (COMBIGAN) 0.2-0.5 % ophthalmic solution Place 1 drop into both eyes every 12 (twelve) hours.   . cholecalciferol (VITAMIN D) 400 units TABS tablet Take 400 Units by mouth 2 (two) times daily.  . finasteride (PROSCAR) 5 MG tablet Take 5 mg by mouth every morning.   . flecainide (TAMBOCOR) 50 MG tablet Take 50 mg by mouth 2 (two) times daily.  . fluocinonide ointment (LIDEX) AB-123456789 % Apply 1 application topically 2 (two) times daily as needed (on rash /skin folds, not for  face).   Marland Kitchen ketoconazole (NIZORAL) 2 % shampoo Apply 1 application topically 2 (two) times a week. For seborrheic dermatitis on Monday and Thursday.  . Lacosamide (VIMPAT) 150 MG TABS Take 1 tablet (150 mg total) by mouth 2 (two) times a day.  . latanoprost (XALATAN) 0.005 % ophthalmic solution Place 1 drop into both eyes at bedtime.  . Levetiracetam (KEPPRA XR) 750 MG TB24 Two tablets at 6 pm, 1 tablet at bedtime  . LORazepam (ATIVAN) 2 MG/ML concentrated solution Give 2 mg IM for seizure.If continued seizure activity after 15 minutes, give 2 mg again as needed.  . phenytoin (DILANTIN) 100 MG ER capsule 1 capsule in the morning, 3 capsules in the evening  . phenytoin (DILANTIN) 30 MG ER capsule 1 capsule on Mondays, Wednesdays, and Fridays  . polyethylene glycol (MIRALAX / GLYCOLAX) packet Take 17 g by mouth See admin instructions. Once daily every other day and as needed for constipation  . sodium chloride 1 g tablet Take 1 tablet (1 g total) by mouth 2 (two) times daily with a meal.  . tamsulosin (FLOMAX) 0.4 MG CAPS capsule Take 0.4 mg by mouth every morning.  . triamcinolone cream (KENALOG) 0.1 % Apply 1 application topically 2 (two) times daily as needed (for flares).   . warfarin (COUMADIN) 2.5 MG tablet Take 1 tablet (2.5 mg total) by mouth daily at 12 noon.   No facility-administered encounter medications on file as of 03/24/2019.     Review of Systems:  Review of Systems  Constitutional: Positive for malaise/fatigue. Negative for chills and fever.       Hard to get going each day  HENT: Positive for hearing loss. Negative for congestion.   Eyes:       Left eye vision poor  Respiratory: Negative for cough and shortness of breath.   Cardiovascular: Negative for chest pain, palpitations and leg swelling.  Genitourinary: Positive for frequency. Negative for dysuria.       Nocturia x 2 typically  Musculoskeletal: Negative for falls and joint pain.  Skin: Positive for itching and rash.   Neurological: Negative for dizziness and loss of consciousness.       Occasional jerking of body  Endo/Heme/Allergies: Bruises/bleeds easily.  Psychiatric/Behavioral: Positive for memory loss. Negative for depression. The patient is not nervous/anxious and does not have insomnia.     Health Maintenance  Topic Date Due  . INFLUENZA VACCINE  01/16/2019  . TETANUS/TDAP  12/06/2025  . PNA vac Low Risk Adult  Completed    Physical Exam: Vitals:   03/24/19 1447  BP: (!) 159/79  Pulse: 64  Resp: 18  Temp: 97.6 F (36.4  C)  SpO2: 95%  Weight: 167 lb (75.8 kg)  Height: 5\' 9"  (1.753 m)   Body mass index is 24.66 kg/m. Physical Exam Vitals signs and nursing note reviewed.  Constitutional:      General: He is not in acute distress.    Appearance: He is not toxic-appearing.  HENT:     Head: Normocephalic and atraumatic.  Cardiovascular:     Rate and Rhythm: Rhythm irregular.  Pulmonary:     Effort: Pulmonary effort is normal.     Breath sounds: Normal breath sounds.  Abdominal:     General: Bowel sounds are normal.  Musculoskeletal: Normal range of motion.     Right lower leg: No edema.     Left lower leg: No edema.     Comments: Ambulates with rollator walker  Skin:    Capillary Refill: Capillary refill takes less than 2 seconds.     Comments: A couple of areas of excoriation on both cheeks from shaving; some scaly skin on scalp  Neurological:     Mental Status: He is alert. Mental status is at baseline.     Gait: Gait abnormal.  Psychiatric:        Mood and Affect: Mood normal.     Labs reviewed: Basic Metabolic Panel: Recent Labs    01/29/19 0012  01/31/19 0952 02/01/19 0455 02/02/19 0157 02/04/19 02/16/19  NA  --    < > 127* 127* 127* 125* 137  K  --    < > 4.0 4.1 3.9 4.0 4.6  CL  --    < > 95* 98 96*  --   --   CO2  --    < > 22 22 23   --   --   GLUCOSE  --    < > 103* 98 92  --   --   BUN  --    < > 6* 7* 7* 9 13  CREATININE  --    < > 0.57* 0.63 0.64  0.7 0.6  CALCIUM  --    < > 8.7* 8.4* 8.3*  --   --   MG 1.9  --   --   --   --   --   --    < > = values in this interval not displayed.   Liver Function Tests: Recent Labs    03/27/18 1154 10/05/18 2016 11/24/18 0300 01/20/19 1141  AST  --  29 26 25   ALT  --  23 24 19   ALKPHOS  --  171* 125 145*  BILITOT  --  0.3  --  0.3  PROT  --  7.9  --  7.2  ALBUMIN 3.8 4.6  --  4.6   No results for input(s): LIPASE, AMYLASE in the last 8760 hours. No results for input(s): AMMONIA in the last 8760 hours. CBC: Recent Labs    11/03/18 0338  01/20/19 1141 01/29/19 2218 01/30/19 0638  WBC 3.6*   < > 4.8 9.4 6.3  NEUTROABS 0.9*  --  2.2 6.1  --   HGB 11.6*   < > 12.7* 11.6* 11.2*  HCT 31.1*   < > 36.1* 32.7* 31.2*  MCV 87.4   < > 91 90.8 89.9  PLT 192   < > 282 248 238   < > = values in this interval not displayed.   Lipid Panel: No results for input(s): CHOL, HDL, LDLCALC, TRIG, CHOLHDL, LDLDIRECT in the last 8760 hours. No results found for:  HGBA1C  INR 3.0 today  Assessment/Plan 1. Chronic atrial fibrillation (HCC) With hypercoagulable state resulting--continue warfarin 2.5mg  daily and recheck INR in 4 wks unless dilantin is adjusted warranting a change in warfarin due to metabolism -pt wants to wait 8 wks, but INR already 3 so I'm not comfortable with this  2. Refractory epilepsy ([place]) -managed by neurology -has prn ativan orders if seizure refractory to his vagal stimulator swiping  3. Hyponatremia -Na has improved since increase in sodium tablets last hospitalization -it's unclear why it went down to start with though as he was stable on the one a day for a long time  4. Parkinsonian syndrome associated with symptomatic orthostatic hypotension (HCC) -no recent falls, maintain hydration and slow positional changes to prevent falls  5. 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 Layton Tappan, D.O. [facility] [address], [place] [address] Cell Phone (Mon-Fri 8am-5pm):  [phone] On Call:  [phone] & follow prompts after 5pm & weekends Office Phone:  [phone] Office Fax:  [phone]

## 2019-03-25 DIAGNOSIS — Z20828 Contact with and (suspected) exposure to other viral communicable diseases: Secondary | ICD-10-CM | POA: Diagnosis not present

## 2019-03-25 LAB — NOVEL CORONAVIRUS, NAA: SARS-CoV-2, NAA: NOT DETECTED

## 2019-03-25 NOTE — Telephone Encounter (Signed)
Dilantin dosing is in the toxic range.  We will stop the 30 mg Dilantin capsule, continue the 100 mg capsules as prescribed, hold the Dilantin dosing for 48 hours, then resume.  We will recheck a Dilantin level in 3 weeks.

## 2019-03-25 NOTE — Addendum Note (Signed)
Addended by: Kathrynn Ducking on: 03/25/2019 10:32 AM   Modules accepted: Orders

## 2019-03-25 NOTE — Telephone Encounter (Signed)
I called the patient, he already understands the course of action with the Dilantin, we will recheck a Dilantin level in 3 weeks.

## 2019-03-25 NOTE — Telephone Encounter (Signed)
[facility] called and stated pt has a critical dilantin level 29.4

## 2019-03-25 NOTE — Telephone Encounter (Signed)
Pt is asking for a call to confirm how exactly he is to take his (DILANTIN) and his Levetiracetam (KEPPRA XR) 750 MG TB24 please call on his cell#

## 2019-03-26 LAB — NOVEL CORONAVIRUS, NAA: SARS-CoV-2, NAA: NOT DETECTED

## 2019-03-29 ENCOUNTER — Other Ambulatory Visit: Payer: Self-pay | Admitting: Internal Medicine

## 2019-03-29 DIAGNOSIS — R41844 Frontal lobe and executive function deficit: Secondary | ICD-10-CM | POA: Diagnosis not present

## 2019-03-29 DIAGNOSIS — G40919 Epilepsy, unspecified, intractable, without status epilepticus: Secondary | ICD-10-CM

## 2019-03-29 DIAGNOSIS — E871 Hypo-osmolality and hyponatremia: Secondary | ICD-10-CM | POA: Diagnosis not present

## 2019-03-29 DIAGNOSIS — R41840 Attention and concentration deficit: Secondary | ICD-10-CM | POA: Diagnosis not present

## 2019-03-29 DIAGNOSIS — R296 Repeated falls: Secondary | ICD-10-CM | POA: Diagnosis not present

## 2019-03-29 MED ORDER — LORAZEPAM 2 MG/ML PO CONC: ORAL | 0 refills | Status: DC

## 2019-03-30 DIAGNOSIS — E871 Hypo-osmolality and hyponatremia: Secondary | ICD-10-CM | POA: Diagnosis not present

## 2019-03-30 DIAGNOSIS — R41844 Frontal lobe and executive function deficit: Secondary | ICD-10-CM | POA: Diagnosis not present

## 2019-03-30 DIAGNOSIS — R296 Repeated falls: Secondary | ICD-10-CM | POA: Diagnosis not present

## 2019-03-30 DIAGNOSIS — R41840 Attention and concentration deficit: Secondary | ICD-10-CM | POA: Diagnosis not present

## 2019-03-31 DIAGNOSIS — Z9189 Other specified personal risk factors, not elsewhere classified: Secondary | ICD-10-CM | POA: Diagnosis not present

## 2019-03-31 NOTE — Telephone Encounter (Signed)
Pt is asking if he can just take the Levetiracetam (KEPPRA XR) 750 MG TB24 at night along with the other medications.  Please call

## 2019-03-31 NOTE — Telephone Encounter (Signed)
I called the patient.  I would prefer not to alter the Keppra at this time, we will see how he does over the next 6 to 8 weeks, if he does not have any daytime drowsiness in the morning at all, we can always change back to Keppra to taking all the medication at nighttime.

## 2019-04-01 ENCOUNTER — Telehealth: Payer: Self-pay | Admitting: Neurology

## 2019-04-01 DIAGNOSIS — Z9181 History of falling: Secondary | ICD-10-CM | POA: Diagnosis not present

## 2019-04-01 DIAGNOSIS — D649 Anemia, unspecified: Secondary | ICD-10-CM | POA: Diagnosis not present

## 2019-04-01 NOTE — Telephone Encounter (Signed)
Pt advised.

## 2019-04-01 NOTE — Telephone Encounter (Signed)
Vimpat level came back at 6.9, the upper limits of therapeutic range is 15.0 for this lab.

## 2019-04-01 NOTE — Telephone Encounter (Signed)
Pt called wanting to know if it is ok for him to take his Levetiracetam (KEPPRA XR) 750 MG TB24 at 7:30pm instead of 8pm due to the staffing of the nursing home and when they are able to get it to him. He also wanted to inform the provider that he has been doing very well. Please advise.

## 2019-04-01 NOTE — Telephone Encounter (Signed)
I reached out to the pt's facility [facility] ( spoke with [employer]) and advised ok for the pt to take keppra at 730 pm in stead of 8.  I also called and left a vm asking of the pt to call back. Once pt calls back please advise Dr. Jannifer Franklin said he could take keppra at 16 and that we have notified the staff caring for him of this change.

## 2019-04-01 NOTE — Telephone Encounter (Signed)
Okay to take Keppra at 7:30 PM rather than 8 PM.

## 2019-04-06 DIAGNOSIS — R41840 Attention and concentration deficit: Secondary | ICD-10-CM | POA: Diagnosis not present

## 2019-04-06 DIAGNOSIS — R296 Repeated falls: Secondary | ICD-10-CM | POA: Diagnosis not present

## 2019-04-06 DIAGNOSIS — E871 Hypo-osmolality and hyponatremia: Secondary | ICD-10-CM | POA: Diagnosis not present

## 2019-04-06 DIAGNOSIS — Z9189 Other specified personal risk factors, not elsewhere classified: Secondary | ICD-10-CM | POA: Diagnosis not present

## 2019-04-06 DIAGNOSIS — R41844 Frontal lobe and executive function deficit: Secondary | ICD-10-CM | POA: Diagnosis not present

## 2019-04-07 ENCOUNTER — Encounter: Payer: Self-pay | Admitting: Internal Medicine

## 2019-04-07 LAB — LACOSAMIDE: Lacosamide: 6.9

## 2019-04-14 ENCOUNTER — Other Ambulatory Visit: Payer: Self-pay | Admitting: *Deleted

## 2019-04-14 MED ORDER — ALPRAZOLAM 0.25 MG PO TABS: 0.2500 mg | ORAL_TABLET | Freq: Two times a day (BID) | ORAL | 5 refills | Status: DC

## 2019-04-14 NOTE — Telephone Encounter (Signed)
[facility] Pended Rx and sent to [name] for approval.

## 2019-04-15 DIAGNOSIS — Z5181 Encounter for therapeutic drug level monitoring: Secondary | ICD-10-CM | POA: Diagnosis not present

## 2019-04-15 DIAGNOSIS — R569 Unspecified convulsions: Secondary | ICD-10-CM | POA: Diagnosis not present

## 2019-04-16 ENCOUNTER — Encounter: Payer: Self-pay | Admitting: Internal Medicine

## 2019-04-19 ENCOUNTER — Telehealth: Payer: Self-pay

## 2019-04-19 NOTE — Telephone Encounter (Signed)
If patient does not have recurrent seizure recently, does not show any signs of toxicity, keep current dose of Dilantin

## 2019-04-19 NOTE — Telephone Encounter (Signed)
Received fax from pt's facility~ [facility]. Report states pt's Dilantin level on 04/15/2019 was 19.5. Pt is currently taking Dilantin ER 100 mg qd and 300 mg q pm.  Facility wanted to confirm if any dosage changes should be made?  Fax # if changes are recommended is [phone].

## 2019-04-19 NOTE — Telephone Encounter (Signed)
I reached out to the pt's facility ( [facility] and spoke with [facility]). She reports the pt has not had any recent seizure like and no toxicity sx. She will call back if further issues arise. She did question when repeat dilantin level should be made. I advised we would plan to check with Dr. Jannifer Franklin on 04/26/2019 when Dr. Jannifer Franklin returns in regards to repeat level.  Sherri was advised if pts status changed between now and then to let me know. She was agreeable.

## 2019-04-20 DIAGNOSIS — Z9189 Other specified personal risk factors, not elsewhere classified: Secondary | ICD-10-CM | POA: Diagnosis not present

## 2019-04-21 ENCOUNTER — Encounter: Payer: Self-pay | Admitting: Internal Medicine

## 2019-04-21 ENCOUNTER — Non-Acute Institutional Stay: Payer: Medicare Other | Admitting: Internal Medicine

## 2019-04-21 ENCOUNTER — Other Ambulatory Visit: Payer: Self-pay

## 2019-04-21 VITALS — BP 122/74 | HR 73 | Temp 97.2°F | Ht 69.0 in | Wt 169.0 lb

## 2019-04-21 DIAGNOSIS — E871 Hypo-osmolality and hyponatremia: Secondary | ICD-10-CM

## 2019-04-21 DIAGNOSIS — I4891 Unspecified atrial fibrillation: Secondary | ICD-10-CM

## 2019-04-21 DIAGNOSIS — I951 Orthostatic hypotension: Secondary | ICD-10-CM | POA: Diagnosis not present

## 2019-04-21 DIAGNOSIS — G20 Parkinson's disease: Secondary | ICD-10-CM | POA: Diagnosis not present

## 2019-04-21 DIAGNOSIS — D6869 Other thrombophilia: Secondary | ICD-10-CM

## 2019-04-21 DIAGNOSIS — M24541 Contracture, right hand: Secondary | ICD-10-CM

## 2019-04-21 DIAGNOSIS — R52 Pain, unspecified: Secondary | ICD-10-CM | POA: Diagnosis not present

## 2019-04-21 DIAGNOSIS — G40909 Epilepsy, unspecified, not intractable, without status epilepticus: Secondary | ICD-10-CM

## 2019-04-21 DIAGNOSIS — M65331 Trigger finger, right middle finger: Secondary | ICD-10-CM | POA: Insufficient documentation

## 2019-04-21 DIAGNOSIS — I482 Chronic atrial fibrillation, unspecified: Secondary | ICD-10-CM

## 2019-04-21 LAB — PHENYTOIN LEVEL, TOTAL: Phenytoin (Dilantin), Serum: 19.5

## 2019-04-21 LAB — POCT INR: INR: 2.2 (ref 2.0–3.0)

## 2019-04-21 NOTE — Progress Notes (Signed)
Location:  [place] Room Number: [id number] Place of Service:  Clinic (12)  Provider: Beuna Bolding L. Mariea Clonts, D.O., C.M.D.  Code Status: DNR, MOST Goals of Care:  Advanced Directives 04/21/2019  Does Patient Have a Medical Advance Directive? Yes  Type of Advance Directive Living will;Out of facility DNR (pink MOST or yellow form)  Does patient want to make changes to medical advance directive? No - Patient declined  Copy of [place] in Chart? -  Would patient like information on creating a medical advance directive? -  Pre-existing out of facility DNR order (yellow form or pink MOST form) Yellow form placed in chart (order not valid for inpatient use);Pink MOST form placed in chart (order not valid for inpatient use)     Chief Complaint  Patient presents with  . Anticoagulation    4 week pt/inr check, 2.2 today     HPI: Patient is a 83 y.o. male seen today for coumadin mgt.  INR was 2.2 with goal 2-3 for chronic afib.    He worries about getting more seizures.  He wants his meds left alone though he does not like taking so much.  He has a MOST form to avoid hospitalizations even with seizures and be kept comfortable here at [facility].    His nose runs a lot, it's yellow but he can blow it right out.  Stools are firm and he is using the miralax regularly--it's ordered every other day and prn.  He did have a firm bm this am.    His right middle finger is crooking up here last night and this morning.  He sees Dr. Fredna Dow this afternoon about the contracture.    He saw derm about a scaly place on his right face by his mouth.    Balance remains terrible.  Must always have his rollator walker.  He does not want to fall.  BP can be as low as 110-115 at times and then up to 130-140.  Gets dizzy if at low end.      Past Medical History:  Diagnosis Date  . Abnormality of gait 12/21/2013  . Atrial fibrillation ([place])   . Blind left eye   .  Carcinoma in situ of prostate   . Cardiac arrest ([place])   . Glaucoma   . Hypercholesteremia   . Hyperlipidemia   . Metabolic encephalopathy   . Mild left ventricular systolic dysfunction   . Presence of other specified functional implants   . PSA elevation   . Right frontal lobe lesion   . Seizures ([place])   . Tinea pedis   . 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 Urinary urgency     Past Surgical History:  Procedure Laterality Date  . CARDIAC DEFIBRILLATOR PLACEMENT    . cataract surgery    . COLONOSCOPY    . CRANIOTOMY Right    right anterior temporal resection  . defibrillator replaced  March 2016  . IMPLANTATION VAGAL NERVE STIMULATOR  June 2016  . NASAL SINUS SURGERY    . TONSILLECTOMY     age 15  . VASECTOMY  1985    Allergies  Allergen Reactions  . Depakote [Divalproex Sodium] Other (See Comments)    Arthralgias     Outpatient Encounter Medications as of 04/21/2019  Medication Sig  . acetaminophen (TYLENOL) 325 MG tablet Take 650 mg by mouth every 6 (six) hours as needed for mild pain or fever.   . ALPRAZolam (XANAX) 0.25  MG tablet Take 1 tablet (0.25 mg total) by mouth 2 (two) times daily.  . brimonidine-timolol (COMBIGAN) 0.2-0.5 % ophthalmic solution Place 1 drop into both eyes every 12 (twelve) hours.   . cholecalciferol (VITAMIN D) 400 units TABS tablet Take 400 Units by mouth 2 (two) times daily.  . clobetasol cream (TEMOVATE) AB-123456789 % Apply 1 application topically 2 (two) times daily as needed (For [place]).  . finasteride (PROSCAR) 5 MG tablet Take 5 mg by mouth every morning.   . flecainide (TAMBOCOR) 50 MG tablet Take 50 mg by mouth 2 (two) times daily.  . fluocinonide ointment (LIDEX) AB-123456789 % Apply 1 application topically 2 (two) times daily as needed (on rash /skin folds, not for face).   Marland Kitchen ketoconazole (NIZORAL) 2 % shampoo Apply 1 application topically 2 (two) times a week. For seborrheic dermatitis on Monday and Thursday.  . Lacosamide (VIMPAT)  150 MG TABS Take 1 tablet (150 mg total) by mouth 2 (two) times a day.  . latanoprost (XALATAN) 0.005 % ophthalmic solution Place 1 drop into both eyes at bedtime.  . Levetiracetam (KEPPRA XR) 750 MG TB24 Two tablets at 6 pm, 1 tablet at bedtime  . LORazepam (ATIVAN) 2 MG/ML concentrated solution Give 2 mg IM for seizure.If continued seizure activity after 15 minutes, give 2 mg again as needed.  . phenytoin (DILANTIN) 100 MG ER capsule 1 capsule in the morning, 3 capsules in the evening  . polyethylene glycol (MIRALAX / GLYCOLAX) packet Take 17 g by mouth See admin instructions. Once daily every other day and as needed for constipation  . sodium chloride 1 g tablet Take 1 tablet (1 g total) by mouth 2 (two) times daily with a meal.  . tamsulosin (FLOMAX) 0.4 MG CAPS capsule Take 0.4 mg by mouth every morning.  . triamcinolone cream (KENALOG) 0.1 % Apply 1 application topically 2 (two) times daily as needed (for flares).   . warfarin (COUMADIN) 2.5 MG tablet Take 1 tablet (2.5 mg total) by mouth daily at 12 noon.  . [DISCONTINUED] phenytoin (DILANTIN) 30 MG ER capsule 1 capsule on Mondays, Wednesdays, and Fridays   No facility-administered encounter medications on file as of 04/21/2019.     Review of Systems:  Review of Systems  Constitutional: Positive for malaise/fatigue. Negative for chills and fever.       Sleepy in the mornings from medications  HENT: Negative for hearing loss.   Eyes:       Loss of vision in one eye chronically  Respiratory: Negative for cough and shortness of breath.   Cardiovascular: Negative for chest pain, palpitations and leg swelling.  Gastrointestinal: Positive for constipation. Negative for abdominal pain and diarrhea.  Genitourinary: Positive for frequency. Negative for dysuria.  Musculoskeletal: Positive for falls. Negative for joint pain.       Trigger finger right middle  Skin:       See hpi  Neurological: Positive for seizures. Negative for dizziness  and loss of consciousness.  Psychiatric/Behavioral: Positive for memory loss. Negative for depression. The patient is not nervous/anxious and does not have insomnia.     Health Maintenance  Topic Date Due  . TETANUS/TDAP  12/06/2025  . INFLUENZA VACCINE  Completed  . PNA vac Low Risk Adult  Completed    Physical Exam: Vitals:   04/21/19 0844  BP: 122/74  Pulse: 73  Temp: (!) 97.2 F (36.2 C)  TempSrc: Oral  SpO2: 98%  Weight: 169 lb (76.7 kg)  Height: 5\' 9"  (  1.753 m)   Body mass index is 24.96 kg/m. Physical Exam Vitals signs reviewed.  Constitutional:      General: He is not in acute distress.    Appearance: Normal appearance. He is not toxic-appearing.  HENT:     Head: Normocephalic and atraumatic.  Eyes:     Extraocular Movements: Extraocular movements intact.     Pupils: Pupils are equal, round, and reactive to light.  Cardiovascular:     Rate and Rhythm: Rhythm irregular.     Heart sounds: No murmur. No friction rub. No gallop.   Pulmonary:     Effort: Pulmonary effort is normal.     Breath sounds: Normal breath sounds. No wheezing, rhonchi or rales.  Musculoskeletal: Normal range of motion.     Comments: Stooped posture, shuffling gait, uses rollator  Skin:    General: Skin is warm and dry.     Capillary Refill: Capillary refill takes less than 2 seconds.  Neurological:     General: No focal deficit present.     Mental Status: He is alert and oriented to person, place, and time.     Cranial Nerves: No cranial nerve deficit.     Gait: Gait abnormal.  Psychiatric:        Mood and Affect: Mood normal.     Labs reviewed: Basic Metabolic Panel: Recent Labs    01/29/19 0012  01/31/19 0952 02/01/19 0455 02/02/19 0157 02/04/19 02/16/19  NA  --    < > 127* 127* 127* 125* 137  K  --    < > 4.0 4.1 3.9 4.0 4.6  CL  --    < > 95* 98 96*  --   --   CO2  --    < > 22 22 23   --   --   GLUCOSE  --    < > 103* 98 92  --   --   BUN  --    < > 6* 7* 7* 9 13   CREATININE  --    < > 0.57* 0.63 0.64 0.7 0.6  CALCIUM  --    < > 8.7* 8.4* 8.3*  --   --   MG 1.9  --   --   --   --   --   --    < > = values in this interval not displayed.   Liver Function Tests: Recent Labs    10/05/18 2016 11/24/18 0300 01/20/19 1141  AST 29 26 25   ALT 23 24 19   ALKPHOS 171* 125 145*  BILITOT 0.3  --  0.3  PROT 7.9  --  7.2  ALBUMIN 4.6  --  4.6   No results for input(s): LIPASE, AMYLASE in the last 8760 hours. No results for input(s): AMMONIA in the last 8760 hours. CBC: Recent Labs    11/03/18 0338  01/20/19 1141 01/29/19 2218 01/30/19 0638  WBC 3.6*   < > 4.8 9.4 6.3  NEUTROABS 0.9*  --  2.2 6.1  --   HGB 11.6*   < > 12.7* 11.6* 11.2*  HCT 31.1*   < > 36.1* 32.7* 31.2*  MCV 87.4   < > 91 90.8 89.9  PLT 192   < > 282 248 238   < > = values in this interval not displayed.    Assessment/Plan 1. Chronic atrial fibrillation (HCC) - continues on coumadin, has been on for years - goal INR 2.3 - POC INR 2.2 today so continue same  dose and recheck INR in 4 wks in [facility]  2. Hypercoagulability due to atrial fibrillation (HCC) - cont coumadin as above - POC INR  3. Seizure disorder ([place]) -continue extensive regimen and vagal stimulator -last dilantin levels wnl  4. Contracture of joint of finger of right hand -has appt this afternoon with Dr. Fredna Dow for possible injection  5. Parkinsonian syndrome associated with symptomatic orthostatic hypotension (HCC) -continues to have days with low bp -encourage adequate hydration, but cannot overdo due to his hyponatremia--continues sodium tablets  6.  Hyponatremia -cognition seems clear and gets altered when this is low -doing better with the bid sodium tablets (up from once daily before)  Labs/tests ordered:   Orders Placed This Encounter  Procedures  . POC INR    Next appt:  4 wks INR check in [place]. Jaquita Bessire, D.O. [facility] [address], [place] [address] Cell Phone (Mon-Fri 8am-5pm):  [phone] On Call:  [phone] & follow prompts after 5pm & weekends Office Phone:  [phone] Office Fax:  [phone]

## 2019-04-22 DIAGNOSIS — R350 Frequency of micturition: Secondary | ICD-10-CM | POA: Diagnosis not present

## 2019-04-22 DIAGNOSIS — N401 Enlarged prostate with lower urinary tract symptoms: Secondary | ICD-10-CM | POA: Diagnosis not present

## 2019-04-22 DIAGNOSIS — R972 Elevated prostate specific antigen [PSA]: Secondary | ICD-10-CM | POA: Diagnosis not present

## 2019-04-28 DIAGNOSIS — Z9189 Other specified personal risk factors, not elsewhere classified: Secondary | ICD-10-CM | POA: Diagnosis not present

## 2019-05-04 DIAGNOSIS — L570 Actinic keratosis: Secondary | ICD-10-CM | POA: Diagnosis not present

## 2019-05-04 DIAGNOSIS — Z85820 Personal history of malignant melanoma of skin: Secondary | ICD-10-CM | POA: Diagnosis not present

## 2019-05-04 DIAGNOSIS — L308 Other specified dermatitis: Secondary | ICD-10-CM | POA: Diagnosis not present

## 2019-05-04 DIAGNOSIS — Z9189 Other specified personal risk factors, not elsewhere classified: Secondary | ICD-10-CM | POA: Diagnosis not present

## 2019-05-07 ENCOUNTER — Telehealth: Payer: Self-pay | Admitting: Neurology

## 2019-05-07 NOTE — Telephone Encounter (Signed)
Pt called stating that he is needing someone to call [facility] to inform them that he is wanting to take his Keppra in the evening with his other medications. Please advise.

## 2019-05-10 NOTE — Telephone Encounter (Signed)
  I reached out to the pt's facility [facility] ( spoke with [facility]) and advised ok for the pt to take keppra at 730 pm in stead of 8.  I also called and left a vm asking of the pt to call back. Once pt calls back please advise Dr. Jannifer Franklin said he could take keppra at 44 and that we have notified the staff caring for him of this change.    Benedetto Coons has been made aware to give pt Keppra in the evenings ( 730) This was from 04/01/2019.

## 2019-05-19 ENCOUNTER — Other Ambulatory Visit: Payer: Self-pay

## 2019-05-19 ENCOUNTER — Non-Acute Institutional Stay: Payer: Medicare Other | Admitting: Internal Medicine

## 2019-05-19 ENCOUNTER — Encounter: Payer: Self-pay | Admitting: Internal Medicine

## 2019-05-19 VITALS — BP 118/66 | HR 65 | Temp 96.9°F | Ht 68.0 in | Wt 171.0 lb

## 2019-05-19 DIAGNOSIS — M24541 Contracture, right hand: Secondary | ICD-10-CM | POA: Diagnosis not present

## 2019-05-19 DIAGNOSIS — N401 Enlarged prostate with lower urinary tract symptoms: Secondary | ICD-10-CM

## 2019-05-19 DIAGNOSIS — L111 Transient acantholytic dermatosis [Grover]: Secondary | ICD-10-CM

## 2019-05-19 DIAGNOSIS — I482 Chronic atrial fibrillation, unspecified: Secondary | ICD-10-CM

## 2019-05-19 DIAGNOSIS — L570 Actinic keratosis: Secondary | ICD-10-CM

## 2019-05-19 DIAGNOSIS — I4891 Unspecified atrial fibrillation: Secondary | ICD-10-CM | POA: Diagnosis not present

## 2019-05-19 DIAGNOSIS — Z66 Do not resuscitate: Secondary | ICD-10-CM

## 2019-05-19 DIAGNOSIS — R350 Frequency of micturition: Secondary | ICD-10-CM | POA: Diagnosis not present

## 2019-05-19 DIAGNOSIS — D6869 Other thrombophilia: Secondary | ICD-10-CM | POA: Diagnosis not present

## 2019-05-19 LAB — POCT INR: INR: 2.2 (ref 2.0–3.0)
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
## 2019-06-15 ENCOUNTER — Non-Acute Institutional Stay: Payer: Medicare Other | Admitting: Adult Health

## 2019-06-15 ENCOUNTER — Encounter: Payer: Self-pay | Admitting: Adult Health

## 2019-06-15 DIAGNOSIS — Z0000 Encounter for general adult medical examination without abnormal findings: Secondary | ICD-10-CM | POA: Diagnosis not present

## 2019-06-15 NOTE — Progress Notes (Signed)
Subjective:   Cameron Potts is a 83 y.o. male who presents for Medicare Annual/Subsequent preventive examination at [facility] assisted living.     Objective:    Vitals: Wt 172 lb 4.8 oz (78.2 kg)   BMI 26.20 kg/m   Body mass index is 26.2 kg/m.  Advanced Directives 06/15/2019 05/19/2019 04/21/2019 03/24/2019 02/05/2019 01/30/2019 01/29/2019  Does Patient Have a Medical Advance Directive? Yes Yes Yes Yes Yes Yes Yes  Type of Paramedic of [place];Living will;Out of facility DNR (pink MOST or yellow form) Living will;Out of facility DNR (pink MOST or yellow form) Living will;Out of facility DNR (pink MOST or yellow form) [place];Living will;Out of facility DNR (pink MOST or yellow form) [place];Out of facility DNR (pink MOST or yellow form);Living will Out of facility DNR (pink MOST or yellow form) Out of facility DNR (pink MOST or yellow form)  Does patient want to make changes to medical advance directive? No - Patient declined No - Patient declined No - Patient declined No - Patient declined - No - Patient declined -  Copy of [place] in Chart? Yes - validated most recent copy scanned in chart (See row information) - - Yes - validated most recent copy scanned in chart (See row information) Yes - validated most recent copy scanned in chart (See row information) - -  Would patient like information on creating a medical advance directive? - - - - - - -  Pre-existing out of facility DNR order (yellow form or pink MOST form) - Yellow form placed in chart (order not valid for inpatient use) Yellow form placed in chart (order not valid for inpatient use);Pink MOST form placed in chart (order not valid for inpatient use) Pink MOST/Yellow Form most recent copy in chart - Physician notified to receive inpatient order Yellow form placed in chart (order not valid for inpatient use) Yellow form placed in  chart (order not valid for inpatient use) Yellow form placed in chart (order not valid for inpatient use)    Tobacco Social History   Tobacco Use  Smoking Status Former Smoker  . Packs/day: 0.75  . Years: 10.00  . Pack years: 7.50  . Types: Cigarettes  Smokeless Tobacco Never Used  Tobacco Comment   quit 1960s     Counseling given: Not Answered Comment: quit 1960s   Clinical Intake:  Pre-visit preparation completed: No  Pain : No/denies pain     BMI - recorded: 26.2 Nutritional Status: BMI 25 -29 Overweight Diabetes: No  How often do you need to have someone help you when you read instructions, pamphlets, or other written materials from your doctor or pharmacy?: 3 - Sometimes What is the last grade level you completed in school?: Masters degree  Interpreter Needed?: No  Information entered by :: Cameron Hawthorn NP  Past Medical History:  Diagnosis Date  . Abnormality of gait 12/21/2013  . Atrial fibrillation ([place])   . Blind left eye   . Carcinoma in situ of prostate   . Cardiac arrest ([place])   . Glaucoma   . Hypercholesteremia   . Hyperlipidemia   . Metabolic encephalopathy   . Mild left ventricular systolic dysfunction   . Presence of other specified functional implants   . PSA elevation   . Right frontal lobe lesion   . Seizures ([place])   . Tinea pedis   . 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 Urinary urgency  Past Surgical History:  Procedure Laterality Date  . CARDIAC DEFIBRILLATOR PLACEMENT    . cataract surgery    . COLONOSCOPY    . CRANIOTOMY Right    right anterior temporal resection  . defibrillator replaced  March 2016  . IMPLANTATION VAGAL NERVE STIMULATOR  June 2016  . NASAL SINUS SURGERY    . TONSILLECTOMY     age 75  . VASECTOMY  1985   Family History  Problem Relation Age of Onset  . Cancer - Lung Mother   . Cancer - Prostate Father    Social History   Socioeconomic History  . Marital status: Married    Spouse name:  Not on file  . Number of children: 3  . Years of education: MBA  . Highest education level: Not on file  Occupational History  . Occupation: Retired  . Occupation: Charity fundraiser  Tobacco Use  . Smoking status: Former Smoker    Packs/day: 0.75    Years: 10.00    Pack years: 7.50    Types: Cigarettes  . Smokeless tobacco: Never Used  . Tobacco comment: quit 1960s  Substance and Sexual Activity  . Alcohol use: No    Comment: former beer drinker  . Drug use: No  . Sexual activity: Not Currently  Other Topics Concern  . Not on file  Social History Narrative   Patient drinks 1-2 cups of caffeine daily.   Patient is right handed.      Tobacco use, amount per day [id number]   Past tobacco use, amount per day:10   How many years did you use tobacco:20   Alcohol use (drinks per week): NONE   Diet:REGULAR   Do you drink/eat things with caffeine:COFFEE, PEPSI   Marital status: MARRIED                                 What year were you married? 1964   Do you live in a house, apartment, assisted living, condo, trailer, etc.? [place]   Is it one or more stories?   How many persons live in your home?   Do you have pets in your home?( please list) NO   Current or past profession: [employer]   Do you exercise?  YES                                Type and how often? 5 DAYS A WEEK FOR 30 MINUTES   Do you have a living will?YES   Do you have a DNR form?                                   If not, do you want to discuss one?   Do you have signed POA/HPOA forms?                        If so, please bring to you appointment         Social Determinants of Health   Financial Resource Strain:   . Difficulty of Paying Living Expenses: Not on file  Food Insecurity:   . Worried About Charity fundraiser in the Last Year: Not on file  . Ran Out of Food in the Last Year: Not on  file  Transportation Needs:   . Film/video editor (Medical): Not on file  . Lack of  Transportation (Non-Medical): Not on file  Physical Activity:   . Days of Exercise per Week: Not on file  . Minutes of Exercise per Session: Not on file  Stress:   . Feeling of Stress : Not on file  Social Connections:   . Frequency of Communication with Friends and Family: Not on file  . Frequency of Social Gatherings with Friends and Family: Not on file  . Attends Religious Services: Not on file  . Active Member of Clubs or Organizations: Not on file  . Attends Archivist Meetings: Not on file  . Marital Status: Not on file    Outpatient Encounter Medications as of 06/15/2019  Medication Sig  . ALPRAZolam (XANAX) 0.25 MG tablet Take 1 tablet (0.25 mg total) by mouth 2 (two) times daily.  . brimonidine-timolol (COMBIGAN) 0.2-0.5 % ophthalmic solution Place 1 drop into both eyes every 12 (twelve) hours.   . cholecalciferol (VITAMIN D) 400 units TABS tablet Take 400 Units by mouth 2 (two) times daily.  . finasteride (PROSCAR) 5 MG tablet Take 5 mg by mouth every morning.   . flecainide (TAMBOCOR) 50 MG tablet Take 50 mg by mouth 2 (two) times daily.  . fluocinonide ointment (LIDEX) AB-123456789 % Apply 1 application topically 2 (two) times daily as needed (on rash /skin folds, not for face).   Marland Kitchen ketoconazole (NIZORAL) 2 % shampoo Apply 1 application topically 2 (two) times a week. For seborrheic dermatitis on Monday and Thursday.  . Lacosamide (VIMPAT) 150 MG TABS Take 1 tablet (150 mg total) by mouth 2 (two) times a day.  . latanoprost (XALATAN) 0.005 % ophthalmic solution Place 1 drop into both eyes at bedtime.  . Levetiracetam (KEPPRA XR) 750 MG TB24 Two tablets at 6 pm, 1 tablet at bedtime  . LORazepam (ATIVAN) 2 MG/ML concentrated solution Give 2 mg IM for seizure.If continued seizure activity after 15 minutes, give 2 mg again as needed.  . phenytoin (DILANTIN) 100 MG ER capsule 1 capsule in the morning, 3 capsules in the evening  . polyethylene glycol (MIRALAX / GLYCOLAX) packet  Take 17 g by mouth See admin instructions. Once daily every other day and as needed for constipation  . sodium chloride 1 g tablet Take 1 tablet (1 g total) by mouth 2 (two) times daily with a meal.  . tamsulosin (FLOMAX) 0.4 MG CAPS capsule Take 0.4 mg by mouth every morning.  . triamcinolone cream (KENALOG) 0.1 % Apply 1 application topically 2 (two) times daily as needed (for flares).   . warfarin (COUMADIN) 2.5 MG tablet Take 1 tablet (2.5 mg total) by mouth daily at 12 noon.  Marland Kitchen acetaminophen (TYLENOL) 325 MG tablet Take 650 mg by mouth every 6 (six) hours as needed for mild pain or fever.   . clobetasol cream (TEMOVATE) [id number] % Apply 1 application topically 2 (two) times daily as needed (For Flares).   No facility-administered encounter medications on file as of 06/15/2019.    Activities of Daily Living In your present state of health, do you have any difficulty performing the following activities: 06/15/2019 01/30/2019  Hearing? N N  Vision? Y N  Difficulty concentrating or making decisions? Y N  Walking or climbing stairs? Y Y  Dressing or bathing? N Y  Doing errands, shopping? Y N  Preparing Food and eating ? Y -  Using the Toilet? N -  In the past six months, have you accidently leaked urine? Y -  Do you have problems with loss of bowel control? Y -  Managing your Medications? Y -  Managing your Finances? Y -  Housekeeping or managing your Housekeeping? Y -  Some recent data might be hidden    Patient Care Team: Gayland Curry, DO as PCP - General (Geriatric Medicine) Rutherford Guys, MD as Consulting Physician (Ophthalmology) Kathrynn Ducking, MD as Consulting Physician (Neurology)   Assessment:   This is a routine wellness examination for Cameron Potts.  Exercise Activities and Dietary recommendations Current Exercise Habits: The patient does not participate in regular exercise at present, Exercise limited by: neurologic condition(s)  Goals    . Exercise 3x per week (30 min per  time)       Fall Risk Fall Risk  06/15/2019 05/19/2019 04/21/2019 12/30/2018 11/18/2018  Falls in the past year? 1 - - 1 1  Number falls in past yr: 1 - - 1 1  Injury with Fall? 0 - - 1 1  Risk for fall due to : History of fall(s);Impaired balance/gait;Impaired mobility History of fall(s);Impaired balance/gait;Impaired vision;Medication side effect;Impaired mobility;Mental status change History of fall(s);Impaired balance/gait;Impaired mobility;Medication side effect;Mental status change - -  Follow up Falls evaluation completed;Falls prevention discussed Falls evaluation completed;Education provided;Falls prevention discussed Falls evaluation completed;Education provided;Falls prevention discussed - -  Comment - at [place] and with rehab team addressed with [facility] - -   Is the patient's home free of loose throw rugs in walkways, pet beds, electrical cords, etc?   yes      Grab bars in the bathroom? yes      Handrails on the stairs?   yes      Adequate lighting?   yes  Timed Get Up and Go Performed: 5 seconds  Depression Screen PHQ 2/9 Scores 06/15/2019 12/30/2018 11/18/2018 05/27/2018  PHQ - 2 Score 1 0 0 0    Cognitive Function MMSE - Mini Mental State Exam 06/15/2019 02/15/2018  Orientation to time 5 5  Orientation to Place 5 5  Registration 3 3  Attention/ Calculation 5 5  Recall 3 3  Language- name 2 objects 2 2  Language- repeat 1 1  Language- follow 3 step command 3 3  Language- read & follow direction 1 1  Write a sentence 1 1  Copy design 1 1  Total score 30 30        Immunization History  Administered Date(s) Administered  . Influenza, Seasonal, Injecte, Preservative Fre 02/22/2010  . Influenza,inj,Quad PF,6+ Mos 04/07/2018, 04/15/2019  . Influenza-Unspecified 03/17/2013, 02/24/2014, 02/16/2015, 03/21/2016, 03/17/2017, 04/07/2018  . Pneumococcal Conjugate-13 07/05/2013  . Pneumococcal Polysaccharide-23 06/17/2006  . Td 06/18/2007  . Tdap 12/07/2015  .  Zoster 10/06/2008  . Zoster Recombinat (Shingrix) 12/04/2016    Qualifies for Shingles Vaccine? Yes up to date  Screening Tests Health Maintenance  Topic Date Due  . TETANUS/TDAP  12/06/2025  . INFLUENZA VACCINE  Completed  . PNA vac Low Risk Adult  Completed   Cancer Screenings: Lung: Low Dose CT Chest recommended if Age 78-80 years, 30 pack-year currently smoking OR have quit w/in 15years. Patient does not qualify. Colorectal: aged out  Additional Screenings:  Not indicated Hepatitis C Screening:      Plan:      I have personally reviewed and noted the following in the patient's chart:   . Medical and social history . Use of alcohol, tobacco or illicit drugs  . Current  medications and supplements . Functional ability and status . Nutritional status . Physical activity . Advanced directives . List of other physicians . Hospitalizations, surgeries, and ER visits in previous 12 months . Vitals . Screenings to include cognitive, depression, and falls . Referrals and appointments  In addition, I have reviewed and discussed with patient certain preventive protocols, quality metrics, and best practice recommendations. A written personalized care plan for preventive services as well as general preventive health recommendations were provided to patient.     Cameron Hawthorn, NP  06/15/2019

## 2019-06-15 NOTE — Patient Instructions (Signed)
Cameron Potts , Thank you for taking time to come for your Medicare Wellness Visit. I appreciate your ongoing commitment to your health goals. Please review the following plan we discussed and let me know if I can assist you in the future.   Screening recommendations/referrals: Colonoscopy aged out Recommended yearly ophthalmology/optometry visit for glaucoma screening and checkup Recommended yearly dental visit for hygiene and checkup  Vaccinations: Influenza vaccine up to date Pneumococcal vaccine up to date Tdap vaccine up to date Shingles vaccine  Up to date    Advanced directives:  reviewed  Conditions/risks identified: fall risk  Next appointment: 1 year  Preventive Care 70 Years and Older, Male Preventive care refers to lifestyle choices and visits with your health care provider that can promote health and wellness. What does preventive care include?  A yearly physical exam. This is also called an annual well check.  Dental exams once or twice a year.  Routine eye exams. Ask your health care provider how often you should have your eyes checked.  Personal lifestyle choices, including:  Daily care of your teeth and gums.  Regular physical activity.  Eating a healthy diet.  Avoiding tobacco and drug use.  Limiting alcohol use.  Practicing safe sex.  Taking low doses of aspirin every day.  Taking vitamin and mineral supplements as recommended by your health care provider. What happens during an annual well check? The services and screenings done by your health care provider during your annual well check will depend on your age, overall health, lifestyle risk factors, and family history of disease. Counseling  Your health care provider may ask you questions about your:  Alcohol use.  Tobacco use.  Drug use.  Emotional well-being.  Home and relationship well-being.  Sexual activity.  Eating habits.  History of falls.  Memory and ability to understand  (cognition).  Work and work Statistician. Screening  You may have the following tests or measurements:  Height, weight, and BMI.  Blood pressure.  Lipid and cholesterol levels. These may be checked every 5 years, or more frequently if you are over 51 years old.  Skin check.  Lung cancer screening. You may have this screening every year starting at age 12 if you have a 30-pack-year history of smoking and currently smoke or have quit within the past 15 years.  Fecal occult blood test (FOBT) of the stool. You may have this test every year starting at age 55.  Flexible sigmoidoscopy or colonoscopy. You may have a sigmoidoscopy every 5 years or a colonoscopy every 10 years starting at age 78.  Prostate cancer screening. Recommendations will vary depending on your family history and other risks.  Hepatitis C blood test.  Hepatitis B blood test.  Sexually transmitted disease (STD) testing.  Diabetes screening. This is done by checking your blood sugar (glucose) after you have not eaten for a while (fasting). You may have this done every 1-3 years.  Abdominal aortic aneurysm (AAA) screening. You may need this if you are a current or former smoker.  Osteoporosis. You may be screened starting at age 70 if you are at high risk. Talk with your health care provider about your test results, treatment options, and if necessary, the need for more tests. Vaccines  Your health care provider may recommend certain vaccines, such as:  Influenza vaccine. This is recommended every year.  Tetanus, diphtheria, and acellular pertussis (Tdap, Td) vaccine. You may need a Td booster every 10 years.  Zoster vaccine. You may  need this after age 58.  Pneumococcal 13-valent conjugate (PCV13) vaccine. One dose is recommended after age 19.  Pneumococcal polysaccharide (PPSV23) vaccine. One dose is recommended after age 68. Talk to your health care provider about which screenings and vaccines you need and  how often you need them. This information is not intended to replace advice given to you by your health care provider. Make sure you discuss any questions you have with your health care provider. Document Released: 06/30/2015 Document Revised: 02/21/2016 Document Reviewed: 04/04/2015 Elsevier Interactive Patient Education  2017 [employer] Prevention in the Home Falls can cause injuries. They can happen to people of all ages. There are many things you can do to make your home safe and to help prevent falls. What can I do on the outside of my home?  Regularly fix the edges of walkways and driveways and fix any cracks.  Remove anything that might make you trip as you walk through a door, such as a raised step or threshold.  Trim any bushes or trees on the path to your home.  Use bright outdoor lighting.  Clear any walking paths of anything that might make someone trip, such as rocks or tools.  Regularly check to see if handrails are loose or broken. Make sure that both sides of any steps have handrails.  Any raised decks and porches should have guardrails on the edges.  Have any leaves, snow, or ice cleared regularly.  Use sand or salt on walking paths during winter.  Clean up any spills in your garage right away. This includes oil or grease spills. What can I do in the bathroom?  Use night lights.  Install grab bars by the toilet and in the tub and shower. Do not use towel bars as grab bars.  Use non-skid mats or decals in the tub or shower.  If you need to sit down in the shower, use a plastic, non-slip stool.  Keep the floor dry. Clean up any water that spills on the floor as soon as it happens.  Remove soap buildup in the tub or shower regularly.  Attach bath mats securely with double-sided non-slip rug tape.  Do not have throw rugs and other things on the floor that can make you trip. What can I do in the bedroom?  Use night lights.  Make sure that you  have a light by your bed that is easy to reach.  Do not use any sheets or blankets that are too big for your bed. They should not hang down onto the floor.  Have a firm chair that has side arms. You can use this for support while you get dressed.  Do not have throw rugs and other things on the floor that can make you trip. What can I do in the kitchen?  Clean up any spills right away.  Avoid walking on wet floors.  Keep items that you use a lot in easy-to-reach places.  If you need to reach something above you, use a strong step stool that has a grab bar.  Keep electrical cords out of the way.  Do not use floor polish or wax that makes floors slippery. If you must use wax, use non-skid floor wax.  Do not have throw rugs and other things on the floor that can make you trip. What can I do with my stairs?  Do not leave any items on the stairs.  Make sure that there are handrails on both sides  of the stairs and use them. Fix handrails that are broken or loose. Make sure that handrails are as long as the stairways.  Check any carpeting to make sure that it is firmly attached to the stairs. Fix any carpet that is loose or worn.  Avoid having throw rugs at the top or bottom of the stairs. If you do have throw rugs, attach them to the floor with carpet tape.  Make sure that you have a light switch at the top of the stairs and the bottom of the stairs. If you do not have them, ask someone to add them for you. What else can I do to help prevent falls?  Wear shoes that:  Do not have high heels.  Have rubber bottoms.  Are comfortable and fit you well.  Are closed at the toe. Do not wear sandals.  If you use a stepladder:  Make sure that it is fully opened. Do not climb a closed stepladder.  Make sure that both sides of the stepladder are locked into place.  Ask someone to hold it for you, if possible.  Clearly mark and make sure that you can see:  Any grab bars or  handrails.  First and last steps.  Where the edge of each step is.  Use tools that help you move around (mobility aids) if they are needed. These include:  Canes.  Walkers.  Scooters.  Crutches.  Turn on the lights when you go into a dark area. Replace any light bulbs as soon as they burn out.  Set up your furniture so you have a clear path. Avoid moving your furniture around.  If any of your floors are uneven, fix them.  If there are any pets around you, be aware of where they are.  Review your medicines with your doctor. Some medicines can make you feel dizzy. This can increase your chance of falling. Ask your doctor what other things that you can do to help prevent falls. This information is not intended to replace advice given to you by your health care provider. Make sure you discuss any questions you have with your health care provider. Document Released: 03/30/2009 Document Revised: 11/09/2015 Document Reviewed: 07/08/2014 Elsevier Interactive Patient Education  2017 [employer].

## 2019-06-21 DIAGNOSIS — Z20828 Contact with and (suspected) exposure to other viral communicable diseases: Secondary | ICD-10-CM | POA: Diagnosis not present

## 2019-06-21 DIAGNOSIS — Z9189 Other specified personal risk factors, not elsewhere classified: Secondary | ICD-10-CM | POA: Diagnosis not present

## 2019-06-23 ENCOUNTER — Other Ambulatory Visit: Payer: Self-pay

## 2019-06-23 ENCOUNTER — Encounter: Payer: Medicare Other | Admitting: Internal Medicine

## 2019-06-23 NOTE — Progress Notes (Signed)
A user error has taken place: encounter opened in error, closed for administrative reasons  patient refuse to be seen we tried to do his INR and he refused.

## 2019-06-28 DIAGNOSIS — Z9189 Other specified personal risk factors, not elsewhere classified: Secondary | ICD-10-CM | POA: Diagnosis not present

## 2019-06-28 DIAGNOSIS — Z20828 Contact with and (suspected) exposure to other viral communicable diseases: Secondary | ICD-10-CM | POA: Diagnosis not present

## 2019-06-29 DIAGNOSIS — Z23 Encounter for immunization: Secondary | ICD-10-CM | POA: Diagnosis not present

## 2019-07-05 ENCOUNTER — Other Ambulatory Visit: Payer: Self-pay | Admitting: *Deleted

## 2019-07-05 MED ORDER — VIMPAT 150 MG PO TABS: 150.0000 mg | ORAL_TABLET | Freq: Two times a day (BID) | ORAL | 1 refills | Status: DC

## 2019-07-05 NOTE — Progress Notes (Signed)
The Vimpat prescription was sent in.

## 2019-07-07 DIAGNOSIS — L308 Other specified dermatitis: Secondary | ICD-10-CM | POA: Diagnosis not present

## 2019-07-07 DIAGNOSIS — L570 Actinic keratosis: Secondary | ICD-10-CM | POA: Diagnosis not present

## 2019-07-07 DIAGNOSIS — Z85820 Personal history of malignant melanoma of skin: Secondary | ICD-10-CM | POA: Diagnosis not present

## 2019-07-12 DIAGNOSIS — Z9189 Other specified personal risk factors, not elsewhere classified: Secondary | ICD-10-CM | POA: Diagnosis not present

## 2019-07-12 DIAGNOSIS — Z20828 Contact with and (suspected) exposure to other viral communicable diseases: Secondary | ICD-10-CM | POA: Diagnosis not present

## 2019-07-14 ENCOUNTER — Encounter: Payer: Self-pay | Admitting: Internal Medicine

## 2019-07-19 DIAGNOSIS — Z20828 Contact with and (suspected) exposure to other viral communicable diseases: Secondary | ICD-10-CM | POA: Diagnosis not present

## 2019-07-19 DIAGNOSIS — Z9189 Other specified personal risk factors, not elsewhere classified: Secondary | ICD-10-CM | POA: Diagnosis not present

## 2019-07-21 ENCOUNTER — Encounter: Payer: Self-pay | Admitting: Internal Medicine

## 2019-07-21 ENCOUNTER — Non-Acute Institutional Stay: Payer: Medicare Other | Admitting: Internal Medicine

## 2019-07-21 ENCOUNTER — Other Ambulatory Visit: Payer: Self-pay

## 2019-07-21 VITALS — BP 138/92 | HR 88 | Temp 97.1°F | Ht 68.0 in | Wt 178.4 lb

## 2019-07-21 DIAGNOSIS — R350 Frequency of micturition: Secondary | ICD-10-CM | POA: Diagnosis not present

## 2019-07-21 DIAGNOSIS — G40909 Epilepsy, unspecified, not intractable, without status epilepticus: Secondary | ICD-10-CM

## 2019-07-21 DIAGNOSIS — I482 Chronic atrial fibrillation, unspecified: Secondary | ICD-10-CM | POA: Diagnosis not present

## 2019-07-21 DIAGNOSIS — N401 Enlarged prostate with lower urinary tract symptoms: Secondary | ICD-10-CM

## 2019-07-21 DIAGNOSIS — Z95810 Presence of automatic (implantable) cardiac defibrillator: Secondary | ICD-10-CM

## 2019-07-21 DIAGNOSIS — D6869 Other thrombophilia: Secondary | ICD-10-CM | POA: Diagnosis not present

## 2019-07-21 DIAGNOSIS — I951 Orthostatic hypotension: Secondary | ICD-10-CM

## 2019-07-21 DIAGNOSIS — G20 Parkinson's disease: Secondary | ICD-10-CM

## 2019-07-21 DIAGNOSIS — I4891 Unspecified atrial fibrillation: Secondary | ICD-10-CM | POA: Diagnosis not present

## 2019-07-21 LAB — POCT INR: INR: 2.1 (ref 2.0–3.0)

## 2019-07-21 NOTE — Progress Notes (Addendum)
INR is therapeutic at 2.1.  continue same dose of coumadin (2.5mg  po daily at noon) and recheck INR in 4 weeks.      Location:  Occupational psychologist of Service:  Clinic (12)  Provider: Thuy Atilano L. Mariea Clonts, D.O., C.M.D.  Code Status: DNR, MOST Goals of Care:  Advanced Directives 07/21/2019  Does Patient Have a Medical Advance Directive? Yes  Type of Advance Directive Out of facility DNR (pink MOST or yellow form)  Does patient want to make changes to medical advance directive? No - Patient declined  Copy of [place] in Chart? -  Would patient like information on creating a medical advance directive? -  Pre-existing out of facility DNR order (yellow form or pink MOST form) -     Chief Complaint  Patient presents with  . Medical Management of Chronic Issues    Follow up and INR     HPI: Patient is a 84 y.o. male seen today for his INR check.  INR was 2.1.  There have been no recent dilantin changes or changes in his vitamin K intake.  No abx or steroids.  He remains on 2.5mg  po daily.    No seizures for a while now.  Follows with Dr. Jannifer Franklin.  Continues to struggle with nocturia despite attempted therapies.  He's not a good candidate for most of the OAB meds and has a huge med list with high risk for interactions.  He had a spell of orthostasis and fell in his kitchen area of his apt while standing still for a prolonged time waiting for his dinner to be delivered.  He's forgotten again about how we've discussed the importance of adequate hydration for him.  During the presyncopal episode where he lowered himself to the floor, he sustained an abrasion to his left cheek in the same area where he was being treated by dermatology so now he has a yellow bruise there.  Past Medical History:  Diagnosis Date  . Abnormality of gait 12/21/2013  . Atrial fibrillation ([place])   . Blind left eye   . Carcinoma in situ of prostate   . Cardiac arrest ([place])   .  Glaucoma   . Hypercholesteremia   . Hyperlipidemia   . Metabolic encephalopathy   . Mild left ventricular systolic dysfunction   . Presence of other specified functional implants   . PSA elevation   . Right frontal lobe lesion   . Seizures ([place])   . Tinea pedis   . 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 Urinary urgency     Past Surgical History:  Procedure Laterality Date  . CARDIAC DEFIBRILLATOR PLACEMENT    . cataract surgery    . COLONOSCOPY    . CRANIOTOMY Right    right anterior temporal resection  . defibrillator replaced  March 2016  . IMPLANTATION VAGAL NERVE STIMULATOR  June 2016  . NASAL SINUS SURGERY    . TONSILLECTOMY     age 27  . VASECTOMY  1985    Allergies  Allergen Reactions  . Depakote [Divalproex Sodium] Other (See Comments)    Arthralgias     Outpatient Encounter Medications as of 07/21/2019  Medication Sig  . acetaminophen (TYLENOL) 325 MG tablet Take 650 mg by mouth every 6 (six) hours as needed for mild pain or fever.   . ALPRAZolam (XANAX) 0.25 MG tablet Take 1 tablet (0.25 mg total) by mouth 2 (two) times daily.  . brimonidine-timolol (COMBIGAN) 0.2-0.5 %  ophthalmic solution Place 1 drop into both eyes every 12 (twelve) hours.   . cholecalciferol (VITAMIN D) 400 units TABS tablet Take 400 Units by mouth 2 (two) times daily.  . clobetasol cream (TEMOVATE) AB-123456789 % Apply 1 application topically 2 (two) times daily as needed (For Flares).  . finasteride (PROSCAR) 5 MG tablet Take 5 mg by mouth every morning.   . flecainide (TAMBOCOR) 50 MG tablet Take 50 mg by mouth 2 (two) times daily.  . fluocinonide ointment (LIDEX) AB-123456789 % Apply 1 application topically 2 (two) times daily as needed (on rash /skin folds, not for face).   Marland Kitchen ketoconazole (NIZORAL) 2 % shampoo Apply 1 application topically 2 (two) times a week. For seborrheic dermatitis on Monday and Thursday.  . Lacosamide (VIMPAT) 150 MG TABS Take 1 tablet (150 mg total) by mouth 2 (two) times  daily.  Marland Kitchen latanoprost (XALATAN) 0.005 % ophthalmic solution Place 1 drop into both eyes at bedtime.  . Levetiracetam (KEPPRA XR) 750 MG TB24 Two tablets at 6 pm, 1 tablet at bedtime  . LORazepam (ATIVAN) 2 MG/ML concentrated solution Give 2 mg IM for seizure.If continued seizure activity after 15 minutes, give 2 mg again as needed.  . phenytoin (DILANTIN) 100 MG ER capsule 1 capsule in the morning, 3 capsules in the evening  . polyethylene glycol (MIRALAX / GLYCOLAX) packet Take 17 g by mouth See admin instructions. Once daily every other day and as needed for constipation  . polyethylene glycol powder (GLYCOLAX/MIRALAX) 17 GM/SCOOP powder Take by mouth.  . sodium chloride 1 g tablet Take 1 tablet (1 g total) by mouth 2 (two) times daily with a meal.  . tamsulosin (FLOMAX) 0.4 MG CAPS capsule Take 0.4 mg by mouth every morning.  . triamcinolone cream (KENALOG) 0.1 % Apply 1 application topically 2 (two) times daily as needed (for flares).   . warfarin (COUMADIN) 2.5 MG tablet Take 1 tablet (2.5 mg total) by mouth daily at 12 noon.   No facility-administered encounter medications on file as of 07/21/2019.    Review of Systems:  Review of Systems  Constitutional: Negative for chills, fever and malaise/fatigue.  HENT: Negative for congestion and sore throat.   Respiratory: Negative for cough and shortness of breath.   Cardiovascular: Negative for chest pain, palpitations and leg swelling.  Gastrointestinal: Negative for abdominal pain and constipation.  Genitourinary: Positive for frequency. Negative for dysuria.  Musculoskeletal: Positive for falls. Negative for joint pain.  Skin: Negative for rash.  Neurological: Positive for dizziness, tremors and seizures. Negative for loss of consciousness.  Endo/Heme/Allergies: Bruises/bleeds easily.  Psychiatric/Behavioral: Positive for memory loss. Negative for depression. The patient is not nervous/anxious and does not have insomnia.     Health  Maintenance  Topic Date Due  . TETANUS/TDAP  12/06/2025  . INFLUENZA VACCINE  Completed  . PNA vac Low Risk Adult  Completed    Physical Exam: Vitals:   07/21/19 1526  BP: (!) 138/92  Pulse: 88  Temp: (!) 97.1 F (36.2 C)  TempSrc: Temporal  SpO2: 97%  Weight: 178 lb 6.4 oz (80.9 kg)  Height: 5\' 8"  (1.727 m)   Body mass index is 27.13 kg/m. Physical Exam Vitals reviewed.  Constitutional:      General: He is not in acute distress.    Appearance: Normal appearance. He is not toxic-appearing.  HENT:     Head: Normocephalic.     Comments: Abrasion and ecchymoses left cheek Cardiovascular:     Rate and  Rhythm: Rhythm irregular.     Heart sounds: No murmur.  Pulmonary:     Effort: Pulmonary effort is normal.     Breath sounds: Normal breath sounds. No wheezing, rhonchi or rales.  Abdominal:     General: Bowel sounds are normal.  Musculoskeletal:        General: Normal range of motion.     Right lower leg: No edema.     Left lower leg: No edema.  Skin:    General: Skin is warm and dry.     Comments: See HEENT  Neurological:     General: No focal deficit present.     Mental Status: He is alert.     Gait: Gait abnormal.     Comments: Uses rollator walker, shuffling gait     Labs reviewed: Basic Metabolic Panel: Recent Labs    01/29/19 0012 01/29/19 2218 01/31/19 0952 01/31/19 0952 02/01/19 0455 02/01/19 0455 02/02/19 0157 02/04/19 0000 02/16/19 0000  NA  --    < > 127*   < > 127*   < > 127* 125* 137  K  --    < > 4.0   < > 4.1   < > 3.9 4.0 4.6  CL  --    < > 95*  --  98  --  96*  --   --   CO2  --    < > 22  --  22  --  23  --   --   GLUCOSE  --    < > 103*  --  98  --  92  --   --   BUN  --    < > 6*   < > 7*   < > 7* 9 13  CREATININE  --    < > 0.57*   < > 0.63   < > 0.64 0.7 0.6  CALCIUM  --    < > 8.7*  --  8.4*  --  8.3*  --   --   MG 1.9  --   --   --   --   --   --   --   --    < > = values in this interval not displayed.   Liver Function  Tests: Recent Labs    10/05/18 2016 11/24/18 0300 01/20/19 1141  AST 29 26 25   ALT 23 24 19   ALKPHOS 171* 125 145*  BILITOT 0.3  --  0.3  PROT 7.9  --  7.2  ALBUMIN 4.6  --  4.6   No results for input(s): LIPASE, AMYLASE in the last 8760 hours. No results for input(s): AMMONIA in the last 8760 hours. CBC: Recent Labs    11/03/18 0338 11/04/18 0247 01/20/19 1141 01/29/19 2218 01/30/19 0638  WBC 3.6*   < > 4.8 9.4 6.3  NEUTROABS 0.9*  --  2.2 6.1  --   HGB 11.6*   < > 12.7* 11.6* 11.2*  HCT 31.1*   < > 36.1* 32.7* 31.2*  MCV 87.4   < > 91 90.8 89.9  PLT 192   < > 282 248 238   < > = values in this interval not displayed.    Assessment/Plan 1. ICD (implantable cardioverter-defibrillator) in place -remains, no recent cardiac concerns   2. Hypercoagulability due to atrial fibrillation (HCC) -continue 2.5mg  coumadin daily and recheck INR in 4 wks here at [facility] - POC INR  3. Chronic atrial fibrillation (HCC) -stable, rate  controlled, cont coumadin for INR 2-3  4. Seizure disorder ([place]) -cont current extensive regimen, if any changes are made, I need to know so we can reassess his INR  5. Parkinsonian syndrome associated with symptomatic orthostatic hypotension (HCC) -cont to hydrate adequately and change positions slowly  6. Benign prostatic hyperplasia with urinary frequency -cont current regimen, f/u with urology >2 wks after second covid vaccine   Labs/tests ordered:  * No order type specified * Next appt:  Visit date not found  Graham Doukas L. Sua Spadafora, D.O. [facility] [address], [place] [address] Cell Phone (Mon-Fri 8am-5pm):  [phone] On Call:  [phone] & follow prompts after 5pm & weekends Office Phone:  [phone] Office Fax:  [phone]

## 2019-07-27 DIAGNOSIS — Z23 Encounter for immunization: Secondary | ICD-10-CM | POA: Diagnosis not present

## 2019-08-02 ENCOUNTER — Telehealth: Payer: Self-pay | Admitting: Neurology

## 2019-08-02 ENCOUNTER — Ambulatory Visit (INDEPENDENT_AMBULATORY_CARE_PROVIDER_SITE_OTHER): Payer: Medicare Other | Admitting: Neurology

## 2019-08-02 ENCOUNTER — Other Ambulatory Visit: Payer: Self-pay

## 2019-08-02 ENCOUNTER — Encounter: Payer: Self-pay | Admitting: Neurology

## 2019-08-02 VITALS — BP 158/76 | HR 63 | Temp 97.0°F | Wt 165.0 lb

## 2019-08-02 DIAGNOSIS — Z5181 Encounter for therapeutic drug level monitoring: Secondary | ICD-10-CM | POA: Diagnosis not present

## 2019-08-02 DIAGNOSIS — R269 Unspecified abnormalities of gait and mobility: Secondary | ICD-10-CM | POA: Diagnosis not present

## 2019-08-02 DIAGNOSIS — G40909 Epilepsy, unspecified, not intractable, without status epilepticus: Secondary | ICD-10-CM

## 2019-08-02 NOTE — Progress Notes (Signed)
Reason for visit: Seizures  Cameron Potts is an 84 y.o. male  History of present illness:  Cameron Potts is an 84 year old right-handed white male with a history of intractable seizures.  The patient is on Dilantin, Vimpat, and Keppra, he also has a vagal nerve stimulator.  His last seizure requiring ER evaluation was on 29 January 2019.  He may have had 1 brief seizure event over a month ago, he was able to abort the seizure with the vagal nerve stimulator.  The patient is having events of orthostatic hypotension with occasional fainting events, the last such episode occurred 10 days ago.  He may feel tired before he faints, he can abort the event by sitting down.  He might have bowel or bladder incontinence if he does blackout.  The patient is on Coumadin.  Uses a walker for ambulation.  He has good stability with walking with a walker.  He still has some early morning drowsiness, this has improved some with reduction of the Dilantin dosing in October 2020, he was toxic at that time.  Past Medical History:  Diagnosis Date  . Abnormality of gait 12/21/2013  . Atrial fibrillation ([place])   . Blind left eye   . Carcinoma in situ of prostate   . Cardiac arrest ([place])   . Glaucoma   . Hypercholesteremia   . Hyperlipidemia   . Metabolic encephalopathy   . Mild left ventricular systolic dysfunction   . Presence of other specified functional implants   . PSA elevation   . Right frontal lobe lesion   . Seizures ([place])   . Tinea pedis   . 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 Urinary urgency     Past Surgical History:  Procedure Laterality Date  . CARDIAC DEFIBRILLATOR PLACEMENT    . cataract surgery    . COLONOSCOPY    . CRANIOTOMY Right    right anterior temporal resection  . defibrillator replaced  March 2016  . IMPLANTATION VAGAL NERVE STIMULATOR  June 2016  . NASAL SINUS SURGERY    . TONSILLECTOMY     age 50  . VASECTOMY  1985    Family History  Problem Relation Age of  Onset  . Cancer - Lung Mother   . Cancer - Prostate Father     Social history:  reports that he has quit smoking. His smoking use included cigarettes. He has a 7.50 pack-year smoking history. He has never used smokeless tobacco. He reports that he does not drink alcohol or use drugs.    Allergies  Allergen Reactions  . Depakote [Divalproex Sodium] Other (See Comments)    Arthralgias     Medications:  Prior to Admission medications   Medication Sig Start Date End Date Taking? Authorizing Provider  acetaminophen (TYLENOL) 325 MG tablet Take 650 mg by mouth every 6 (six) hours as needed for mild pain or fever.    Yes [provider]  ALPRAZolam (XANAX) 0.25 MG tablet Take 1 tablet (0.25 mg total) by mouth 2 (two) times daily. 04/14/19  Yes Reed, Tiffany L, DO  brimonidine-timolol (COMBIGAN) 0.2-0.5 % ophthalmic solution Place 1 drop into both eyes every 12 (twelve) hours.  01/05/18  Yes [provider]  cholecalciferol (VITAMIN D) 400 units TABS tablet Take 400 Units by mouth 2 (two) times daily.   Yes [provider]  clobetasol cream (TEMOVATE) AB-123456789 % Apply 1 application topically 2 (two) times daily as needed (For Flares).   Yes [provider]  finasteride (PROSCAR) 5 MG tablet Take 5 mg by mouth every morning.  02/12/19  Yes [provider]  flecainide (TAMBOCOR) 50 MG tablet Take 50 mg by mouth 2 (two) times daily.   Yes [provider]  fluocinonide ointment (LIDEX) AB-123456789 % Apply 1 application topically 2 (two) times daily as needed (on rash /skin folds, not for face).    Yes [provider]  ketoconazole (NIZORAL) 2 % shampoo Apply 1 application topically 2 (two) times a week. For seborrheic dermatitis on Monday and Thursday.   Yes [provider]  Lacosamide (VIMPAT) 150 MG TABS Take 1 tablet (150 mg total) by mouth 2 (two) times daily. 07/05/19  Yes Kathrynn Ducking, MD  latanoprost (XALATAN) 0.005 % ophthalmic  solution Place 1 drop into both eyes at bedtime.   Yes [provider]  levETIRAcetam (KEPPRA XR PO) Take 750 mg by mouth at bedtime.   Yes [provider]  Levetiracetam (KEPPRA XR) 750 MG TB24 Two tablets at 6 pm, 1 tablet at bedtime 03/18/19  Yes Kathrynn Ducking, MD  LORazepam (ATIVAN) 2 MG/ML concentrated solution Give 2 mg IM for seizure.If continued seizure activity after 15 minutes, give 2 mg again as needed. 03/29/19  Yes Reed, Tiffany L, DO  phenytoin (DILANTIN) 100 MG ER capsule 1 capsule in the morning, 3 capsules in the evening 12/24/18  Yes Kathrynn Ducking, MD  polyethylene glycol [facility] / GLYCOLAX) packet Take 17 g by mouth See admin instructions. Once daily every other day and as needed for constipation   Yes [provider]  polyethylene glycol powder (GLYCOLAX/MIRALAX) 17 GM/SCOOP powder Take by mouth.   Yes [provider]  sodium chloride 1 g tablet Take 1 tablet (1 g total) by mouth 2 (two) times daily with a meal. 02/01/19  Yes Vasireddy, Grier Mitts, MD  tamsulosin (FLOMAX) 0.4 MG CAPS capsule Take 0.4 mg by mouth every morning.   Yes [provider]  triamcinolone cream (KENALOG) 0.1 % Apply 1 application topically 2 (two) times daily as needed (for flares).    Yes [provider]  warfarin (COUMADIN) 2.5 MG tablet Take 1 tablet (2.5 mg total) by mouth daily at 12 noon. 02/12/19  Yes Reed, Tiffany L, DO    ROS:  Out of a complete 14 system review of symptoms, the patient complains only of the following symptoms, and all other reviewed systems are negative.  Drowsiness Fainting events Seizures Fatigue  Blood pressure (!) 158/76, pulse 63, temperature (!) 97 F (36.1 C), weight 165 lb (74.8 kg).   Blood pressure systolic, right arm, sitting is 142.  Blood pressure, right arm, systolic while standing is 120.  Physical Exam  General: The patient is alert and cooperative at the time of the examination.  Skin: No  significant peripheral edema is noted.   Neurologic Exam  Mental status: The patient is alert and oriented x 3 at the time of the examination. The patient has apparent normal recent and remote memory, with an apparently normal attention span and concentration ability.   Cranial nerves: Facial symmetry is present. Speech is normal, no aphasia or dysarthria is noted. Extraocular movements are full. Visual fields are full.  Motor: The patient has good strength in all 4 extremities.  Sensory examination: Soft touch sensation is symmetric on the face, arms, and legs.  Coordination: The patient has good finger-nose-finger and heel-to-shin bilaterally.  Gait and station: The patient is able to ambulate fairly well with a walker,  has good stride with this.  Tandem gait was not attempted.  Reflexes: Deep tendon reflexes are symmetric.   Assessment/Plan:  1.  History of intractable seizures  2.  Peripheral neuropathy, gait disorder  3.  Orthostatic hypotension, fainting events  4.  Vagal nerve stimulator placement  The patient will be sent for blood work today.  He remains on Coumadin therapy.  He will continue on his current medications at the current dose, he seems to have done quite well over the last several months.  We will follow-up here in 6 months.  Jill Alexanders MD 08/02/2019 10:58 AM  [facility] [address] [place], [place] [address]  Phone [phone] Fax [phone]

## 2019-08-02 NOTE — Telephone Encounter (Signed)
I called Cameron Potts about the visit today with Dr.Willis.I stated note states the  patient will be sent for blood work today.  He remains on Coumadin therapy.  He will continue on his current medications at the current dose,  We will follow-up here in 6 months.Cameron Potts verbalized understanding.

## 2019-08-02 NOTE — Telephone Encounter (Signed)
Shantay from [facility] called needing to speak to RN to get clarification on the notes pt had from today's appt. Please advise.

## 2019-08-10 LAB — COMPREHENSIVE METABOLIC PANEL
ALT: 16 IU/L (ref 0–44)
AST: 31 IU/L (ref 0–40)
Albumin/Globulin Ratio: 1.8 (ref 1.2–2.2)
Albumin: 4.9 g/dL — ABNORMAL HIGH (ref 3.6–4.6)
Alkaline Phosphatase: 156 IU/L — ABNORMAL HIGH (ref 39–117)
BUN/Creatinine Ratio: 17 (ref 10–24)
BUN: 14 mg/dL (ref 8–27)
Bilirubin Total: <0.2 mg/dL (ref 0.0–1.2)
CO2: 17 mmol/L — ABNORMAL LOW (ref 20–29)
Calcium: 9.1 mg/dL (ref 8.6–10.2)
Chloride: 102 mmol/L (ref 96–106)
Creatinine, Ser: 0.81 mg/dL (ref 0.76–1.27)
GFR calc Af Amer: 96 mL/min/{1.73_m2} (ref >59)
GFR calc non Af Amer: 83 mL/min/{1.73_m2} (ref >59)
Globulin, Total: 2.7 g/dL (ref 1.5–4.5)
Glucose: 84 mg/dL (ref 65–99)
Potassium: 5.1 mmol/L (ref 3.5–5.2)
Sodium: 137 mmol/L (ref 134–144)
Total Protein: 7.6 g/dL (ref 6.0–8.5)

## 2019-08-10 LAB — CBC WITH DIFFERENTIAL/PLATELET
Basophils Absolute: 0.1 10*3/uL (ref 0.0–0.2)
Basos: 1 %
EOS (ABSOLUTE): 0.6 10*3/uL — ABNORMAL HIGH (ref 0.0–0.4)
Eos: 11 %
Hematocrit: 38.9 % (ref 37.5–51.0)
Hemoglobin: 13.9 g/dL (ref 13.0–17.7)
Immature Grans (Abs): 0 10*3/uL (ref 0.0–0.1)
Immature Granulocytes: 0 %
Lymphocytes Absolute: 1.7 10*3/uL (ref 0.7–3.1)
Lymphs: 29 %
MCH: 32.9 pg (ref 26.6–33.0)
MCHC: 35.7 g/dL (ref 31.5–35.7)
MCV: 92 fL (ref 79–97)
Monocytes Absolute: 0.6 10*3/uL (ref 0.1–0.9)
Monocytes: 10 %
Neutrophils Absolute: 2.7 10*3/uL (ref 1.4–7.0)
Neutrophils: 49 %
Platelets: 225 10*3/uL (ref 150–450)
RBC: 4.22 x10E6/uL (ref 4.14–5.80)
RDW: 13.2 % (ref 11.6–15.4)
WBC: 5.7 10*3/uL (ref 3.4–10.8)

## 2019-08-10 LAB — PHENYTOIN LEVEL, TOTAL

## 2019-08-10 LAB — LACOSAMIDE: Lacosamide: 3.5 ug/mL — ABNORMAL LOW (ref 5.0–10.0)

## 2019-08-10 LAB — LEVETIRACETAM LEVEL: Levetiracetam Lvl: 22.3 ug/mL (ref 10.0–40.0)

## 2019-08-10 LAB — PROTIME-INR

## 2019-08-11 ENCOUNTER — Telehealth: Payer: Self-pay | Admitting: Neurology

## 2019-08-11 NOTE — Telephone Encounter (Signed)
  I called the patient.  The blood drawl did not have enough blood to check pro time or the Dilantin level.  The patient did have a brief seizure the day after I saw him in the office, he was able to abort the seizure by swiping the vagal nerve stimulator.  The Vimpat level is slightly low, we could adjust the dose in the future, Keppra level is mid therapeutic range, slight elevation alkaline phosphatase likely related to use of Dilantin.  I will put in a reminder to check a Dilantin level next week when I am in the office.  I discussed the blood work results with the patient.   The patient resides at [facility], the number there is [phone].  The fax number is [phone].

## 2019-08-16 ENCOUNTER — Telehealth: Payer: Self-pay | Admitting: Neurology

## 2019-08-16 DIAGNOSIS — Z5181 Encounter for therapeutic drug level monitoring: Secondary | ICD-10-CM

## 2019-08-16 NOTE — Telephone Encounter (Signed)
Order for blood work for Dilantin will be faxed to extended[facility]care facility

## 2019-08-17 DIAGNOSIS — R569 Unspecified convulsions: Secondary | ICD-10-CM | POA: Diagnosis not present

## 2019-08-17 DIAGNOSIS — Z5181 Encounter for therapeutic drug level monitoring: Secondary | ICD-10-CM | POA: Diagnosis not present

## 2019-08-18 ENCOUNTER — Telehealth: Payer: Self-pay | Admitting: Neurology

## 2019-08-18 NOTE — Telephone Encounter (Signed)
Most recent Dilantin level in 17 August 2019 was 21.3, in the low toxic range.  I called the patient.  My indication would be to leave the Dilantin dosing alone, he seems to do better doses between 18 and 20, his last level on 15 April 2019 was 19.5.  This level is very close to this and may represent minor undulations in blood levels when factoring in the dosing of the Coumadin.  A minor change to the Dilantin dosing is likely to result in a very large change in the blood levels, the patient has been doing relatively well with the seizures lately.  Unless the patient is having significant cognitive side effects or significant gait instability, I would leave the Dilantin dosing as is.

## 2019-08-19 NOTE — Telephone Encounter (Signed)
[name] nurse from [facility] called back. I advised her of call from patient and that Dr Jannifer Franklin had called patient yesterday evening. [name] stated that he did not have any additional labs drawn. She stated that patient's nurse yesterday showed him a copy of the phenytoin lab result. She stated he does get confused at times. I read her Dr Tobey Grim conversation with patient yesterday; she verbalized understanding, appreciation.

## 2019-08-19 NOTE — Telephone Encounter (Addendum)
Patient called and stated he had phenytoin level drawn at [facility] on Monday. He said that when he was in our office they couldn't get enough blood for it. He is asking for the result of recent lab. I advised him it will have to be faxed to Dr Jannifer Franklin, and I am not aware that Dr Jannifer Franklin has received it. He stated he wants to know result when we receive it. I advised will let Dr Jannifer Franklin know. Patient verbalized understanding, appreciation. Called [facility], LVM requesting call back for lab information.

## 2019-08-25 ENCOUNTER — Encounter: Payer: Self-pay | Admitting: Internal Medicine

## 2019-08-25 ENCOUNTER — Non-Acute Institutional Stay: Payer: Medicare Other | Admitting: Internal Medicine

## 2019-08-25 ENCOUNTER — Other Ambulatory Visit: Payer: Self-pay

## 2019-08-25 VITALS — BP 138/78 | HR 66 | Temp 97.3°F | Ht 68.0 in | Wt 177.0 lb

## 2019-08-25 DIAGNOSIS — I4891 Unspecified atrial fibrillation: Secondary | ICD-10-CM | POA: Diagnosis not present

## 2019-08-25 DIAGNOSIS — B353 Tinea pedis: Secondary | ICD-10-CM

## 2019-08-25 DIAGNOSIS — G40909 Epilepsy, unspecified, not intractable, without status epilepticus: Secondary | ICD-10-CM | POA: Diagnosis not present

## 2019-08-25 DIAGNOSIS — L111 Transient acantholytic dermatosis [Grover]: Secondary | ICD-10-CM | POA: Diagnosis not present

## 2019-08-25 DIAGNOSIS — G20 Parkinson's disease: Secondary | ICD-10-CM

## 2019-08-25 DIAGNOSIS — D6869 Other thrombophilia: Secondary | ICD-10-CM

## 2019-08-25 DIAGNOSIS — I951 Orthostatic hypotension: Secondary | ICD-10-CM | POA: Diagnosis not present

## 2019-08-25 DIAGNOSIS — G20C Parkinsonism, unspecified: Secondary | ICD-10-CM

## 2019-08-25 LAB — POCT INR: INR: 2.1 (ref 2.0–3.0)

## 2019-08-25 NOTE — Progress Notes (Signed)
Location:  Occupational psychologist of Service:  Clinic (12)  Provider: Andora Krull L. Mariea Clonts, D.O., C.M.D.  Code Status: DNR, MOST Goals of Care:  Advanced Directives 07/21/2019  Does Patient Have a Medical Advance Directive? Yes  Type of Advance Directive Out of facility DNR (pink MOST or yellow form)  Does patient want to make changes to medical advance directive? No - Patient declined  Copy of [place] in Chart? -  Would patient like information on creating a medical advance directive? -  Pre-existing out of facility DNR order (yellow form or pink MOST form) -     Chief Complaint  Patient presents with  . Medical Management of Chronic Issues    4 week follow up with INR Check     HPI: Patient is a 84 y.o. male seen today for medical management of chronic diseases.    INR 2.1.  On 2.5mg  daily of warfarin.  No recent changes to dilantin or other meds.  No increased bleeding.  Chronic easy bruising.  No dietary changes.  Notes his athlete's foot is worse lately.  Wants a stronger foot cream than the clobetasol 0.05% which is not doing the trick.    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  Past Medical History:  Diagnosis Date  . Abnormality of gait 12/21/2013  . Atrial fibrillation ([place])   . Blind left eye   . Carcinoma in situ of prostate   . Cardiac arrest ([place])   . Glaucoma   . Hypercholesteremia   . Hyperlipidemia   . Metabolic encephalopathy   . Mild left ventricular systolic dysfunction   . Presence of other specified functional implants   . PSA elevation   . Right frontal lobe lesion   . Seizures ([place])   . Tinea pedis   . 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 Urinary urgency     Past Surgical History:  Procedure Laterality Date  . CARDIAC DEFIBRILLATOR PLACEMENT    . cataract surgery    . COLONOSCOPY    . CRANIOTOMY Right    right anterior temporal resection  . defibrillator replaced  March 2016  .  IMPLANTATION VAGAL NERVE STIMULATOR  June 2016  . NASAL SINUS SURGERY    . TONSILLECTOMY     age 46  . VASECTOMY  1985    Allergies  Allergen Reactions  . Depakote [Divalproex Sodium] Other (See Comments)    Arthralgias     Outpatient Encounter Medications as of 08/25/2019  Medication Sig  . acetaminophen (TYLENOL) 325 MG tablet Take 650 mg by mouth every 6 (six) hours as needed for mild pain or fever.   . ALPRAZolam (XANAX) 0.25 MG tablet Take 1 tablet (0.25 mg total) by mouth 2 (two) times daily.  . brimonidine-timolol (COMBIGAN) 0.2-0.5 % ophthalmic solution Place 1 drop into both eyes every 12 (twelve) hours.   . cholecalciferol (VITAMIN D) 400 units TABS tablet Take 400 Units by mouth 2 (two) times daily.  . clobetasol cream (TEMOVATE) AB-123456789 % Apply 1 application topically 2 (two) times daily as needed (For Flares).  . finasteride (PROSCAR) 5 MG tablet Take 5 mg by mouth every morning.   . flecainide (TAMBOCOR) 50 MG tablet Take 50 mg by mouth 2 (two) times daily.  . fluocinonide ointment (LIDEX) AB-123456789 % Apply 1 application topically 2 (two) times daily as needed (on rash /skin folds, not for face).   Marland Kitchen ketoconazole (NIZORAL) 2 % shampoo Apply 1 application  topically 2 (two) times a week. For seborrheic dermatitis on Monday and Thursday.  . Lacosamide (VIMPAT) 150 MG TABS Take 1 tablet (150 mg total) by mouth 2 (two) times daily.  Marland Kitchen latanoprost (XALATAN) 0.005 % ophthalmic solution Place 1 drop into both eyes at bedtime.  . levETIRAcetam (KEPPRA XR PO) Take 750 mg by mouth at bedtime.  . Levetiracetam (KEPPRA XR) 750 MG TB24 Two tablets at 6 pm, 1 tablet at bedtime  . LORazepam (ATIVAN) 2 MG/ML concentrated solution Give 2 mg IM for seizure.If continued seizure activity after 15 minutes, give 2 mg again as needed.  . phenytoin (DILANTIN) 100 MG ER capsule 1 capsule in the morning, 3 capsules in the evening  . polyethylene glycol (MIRALAX / GLYCOLAX) packet Take 17 g by mouth See admin  instructions. Once daily every other day and as needed for constipation  . polyethylene glycol powder (GLYCOLAX/MIRALAX) 17 GM/SCOOP powder Take by mouth.  . sodium chloride 1 g tablet Take 1 tablet (1 g total) by mouth 2 (two) times daily with a meal.  . tamsulosin (FLOMAX) 0.4 MG CAPS capsule Take 0.4 mg by mouth every morning.  . triamcinolone cream (KENALOG) 0.1 % Apply 1 application topically 2 (two) times daily as needed (for flares).   . warfarin (COUMADIN) 2.5 MG tablet Take 1 tablet (2.5 mg total) by mouth daily at 12 noon.   No facility-administered encounter medications on file as of 08/25/2019.    Review of Systems:  Review of Systems  Constitutional: Positive for malaise/fatigue. Negative for chills and fever.  HENT: Positive for congestion. Negative for sore throat.   Eyes:       Blind in one eye  Respiratory: Negative for cough and shortness of breath.   Cardiovascular: Negative for chest pain, palpitations and leg swelling.  Gastrointestinal: Negative for abdominal pain.  Genitourinary: Negative for dysuria.  Musculoskeletal: Negative for falls and joint pain.  Skin: Positive for rash.       Dry skin patches on trunk and feet; very thick fungal toenails managed by podiatry here now  Neurological: Positive for seizures. Negative for dizziness.  Endo/Heme/Allergies: Bruises/bleeds easily.  Psychiatric/Behavioral: Positive for memory loss. Negative for depression. The patient is not nervous/anxious and does not have insomnia.     Health Maintenance  Topic Date Due  . TETANUS/TDAP  12/06/2025  . INFLUENZA VACCINE  Completed  . PNA vac Low Risk Adult  Completed    Physical Exam: Vitals:   08/25/19 1337  BP: 138/78  Pulse: 66  Temp: (!) 97.3 F (36.3 C)  TempSrc: Temporal  SpO2: 98%  Weight: 177 lb (80.3 kg)  Height: 5\' 8"  (1.727 m)   Body mass index is 26.91 kg/m. Physical Exam Vitals reviewed.  Constitutional:      General: He is not in acute distress.     Appearance: Normal appearance. He is not toxic-appearing.  HENT:     Head: Normocephalic and atraumatic.  Eyes:     Pupils: Pupils are equal, round, and reactive to light.  Cardiovascular:     Rate and Rhythm: Normal rate and regular rhythm.     Heart sounds: No murmur.  Pulmonary:     Effort: Pulmonary effort is normal.     Breath sounds: Normal breath sounds.  Abdominal:     General: Bowel sounds are normal.  Musculoskeletal:        General: Normal range of motion.     Right lower leg: No edema.  Left lower leg: No edema.  Skin:    Comments: Red scaly bordered patches on feet, thickened yellow toenails  Neurological:     General: No focal deficit present.     Mental Status: He is alert.     Cranial Nerves: No cranial nerve deficit.     Gait: Gait abnormal.     Comments: Ambulates with rollator walker with seat and basket beneath  Psychiatric:        Mood and Affect: Mood normal.     Labs reviewed: Basic Metabolic Panel: Recent Labs    01/20/19 1141 01/29/19 0012 01/29/19 2218 02/01/19 0455 02/01/19 0455 02/02/19 0157 02/04/19 0000 02/16/19 0000 08/02/19 1128  NA   < >  --    < > 127*   < > 127* 125* 137 137  K   < >  --    < > 4.1   < > 3.9 4.0 4.6 5.1  CL  --   --    < > 98  --  96*  --   --  102  CO2  --   --    < > 22  --  23  --   --  17*  GLUCOSE  --   --    < > 98  --  92  --   --  84  BUN   < >  --    < > 7*   < > 7* 9 13 14   CREATININE  --   --    < > 0.63   < > 0.64 0.7 0.6 0.81  CALCIUM  --   --    < > 8.4*  --  8.3*  --   --  9.1  MG  --  1.9  --   --   --   --   --   --   --    < > = values in this interval not displayed.   Liver Function Tests: Recent Labs    10/05/18 2016 10/05/18 2016 11/24/18 0300 01/20/19 1141 08/02/19 1128  AST 29   < > 26 25 31   ALT 23   < > 24 19 16   ALKPHOS 171*   < > 125 145* 156*  BILITOT 0.3  --   --  0.3 <0.2  PROT 7.9  --   --  7.2 7.6  ALBUMIN 4.6  --   --  4.6 4.9*   < > = values in this interval  not displayed.   No results for input(s): LIPASE, AMYLASE in the last 8760 hours. No results for input(s): AMMONIA in the last 8760 hours. CBC: Recent Labs    11/04/18 0247 01/20/19 1141 01/29/19 2218 01/30/19 0638 08/02/19 1128  WBC   < > 4.8 9.4 6.3 5.7  NEUTROABS  --  2.2 6.1  --  2.7  HGB   < > 12.7* 11.6* 11.2* 13.9  HCT   < > 36.1* 32.7* 31.2* 38.9  MCV   < > 91 90.8 89.9 92  PLT   < > 282 248 238 225   < > = values in this interval not displayed.   Lipid Panel: No results for input(s): CHOL, HDL, LDLCALC, TRIG, CHOLHDL, LDLDIRECT in the last 8760 hours. No results found for: HGBA1C  Procedures since last visit: No results found.  Assessment/Plan 1. Hypercoagulability due to atrial fibrillation (HCC) - POC INR therapeutic at 2.1 today, cont same dose of coumadin (2.5mg  po daily)  and recheck INR in 4 weeks here in [facility]  2. Seizure disorder ([place]) -cont dilantin and keppra and vimpat -has prn ativan for acute seizure and his vagal stimulator to use -does not want to be hospitalized with seizures--has MOST form -last dilantin level was slightly elevated, but neuro opted to keep same to prevent seizures as he's tolerating it at this level  3. Parkinsonian syndrome associated with symptomatic orthostatic hypotension (HCC) -cont rollator use at all times, hydration, slow positional changes  4. Tinea pedis of both feet -change to clotrimazole 1% cream from clobetasol 0.05% cream and see how that does for him -cont podiatry trimming nails not going to salon where he can spread his infection to others  5. Grover's disease -cont kenalog cream for this  Labs/tests ordered:   Lab Orders     POC INR  Next appt: 09/29/2019   Brooklee Michelin L. Guila Owensby, D.O. [facility] [address], [place] [address] Cell Phone (Mon-Fri 8am-5pm):  [phone] On Call:  [phone] & follow prompts after 5pm & weekends Office Phone:   [phone] Office Fax:  [phone]

## 2019-08-26 NOTE — Progress Notes (Signed)
INR 2.1 with goal 2-3.  Cont coumadin 2.5mg  po daily and recheck INR in 4 weeks.  09/29/2019 in [facility].

## 2019-08-27 IMAGING — CR CHEST - 2 VIEW
2 series · 2 of 2 positions shown · non-contrast
Comparison: 11/02/2018

CLINICAL DATA: Fever.

EXAM:
CHEST - 2 VIEW

[chest lat]
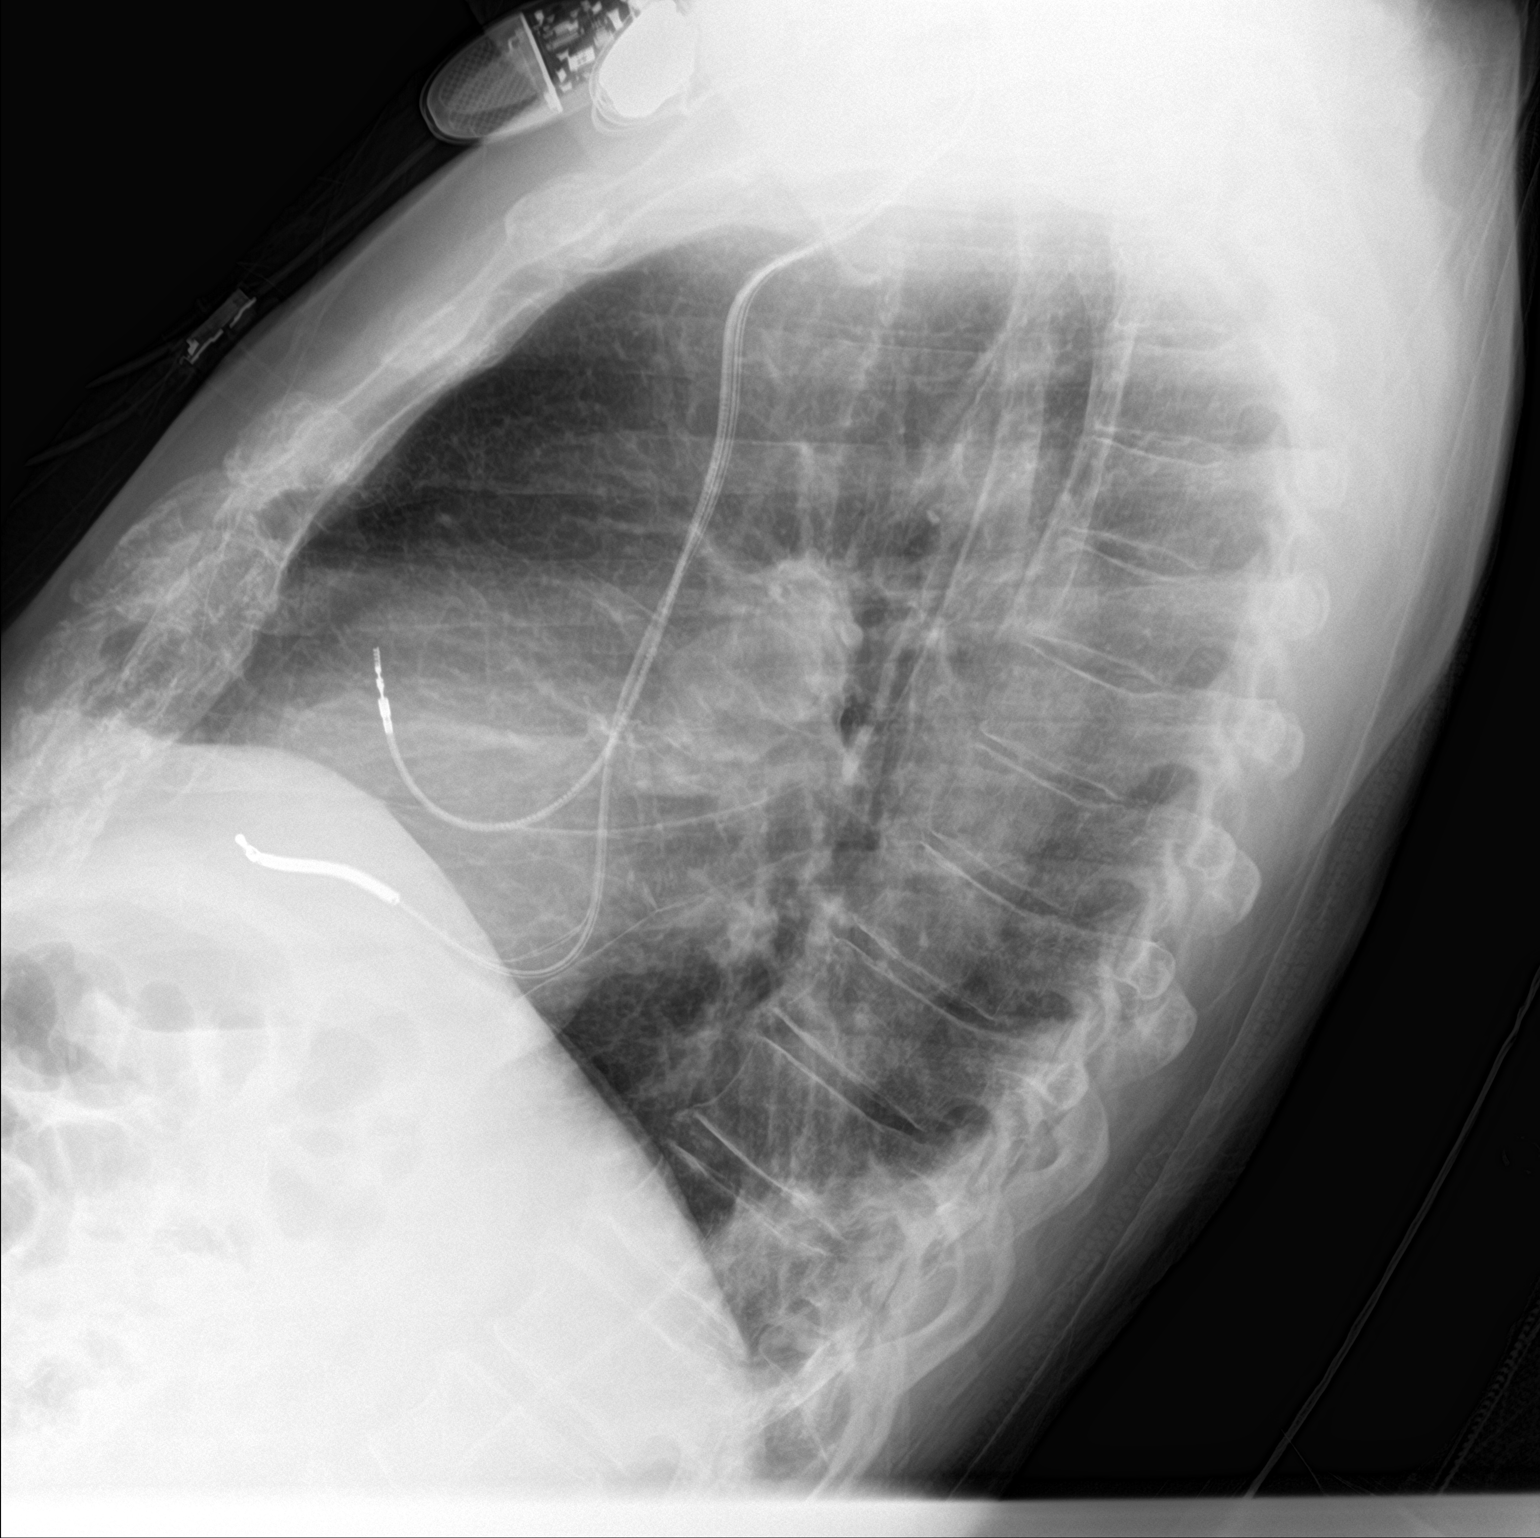

[chest ap]
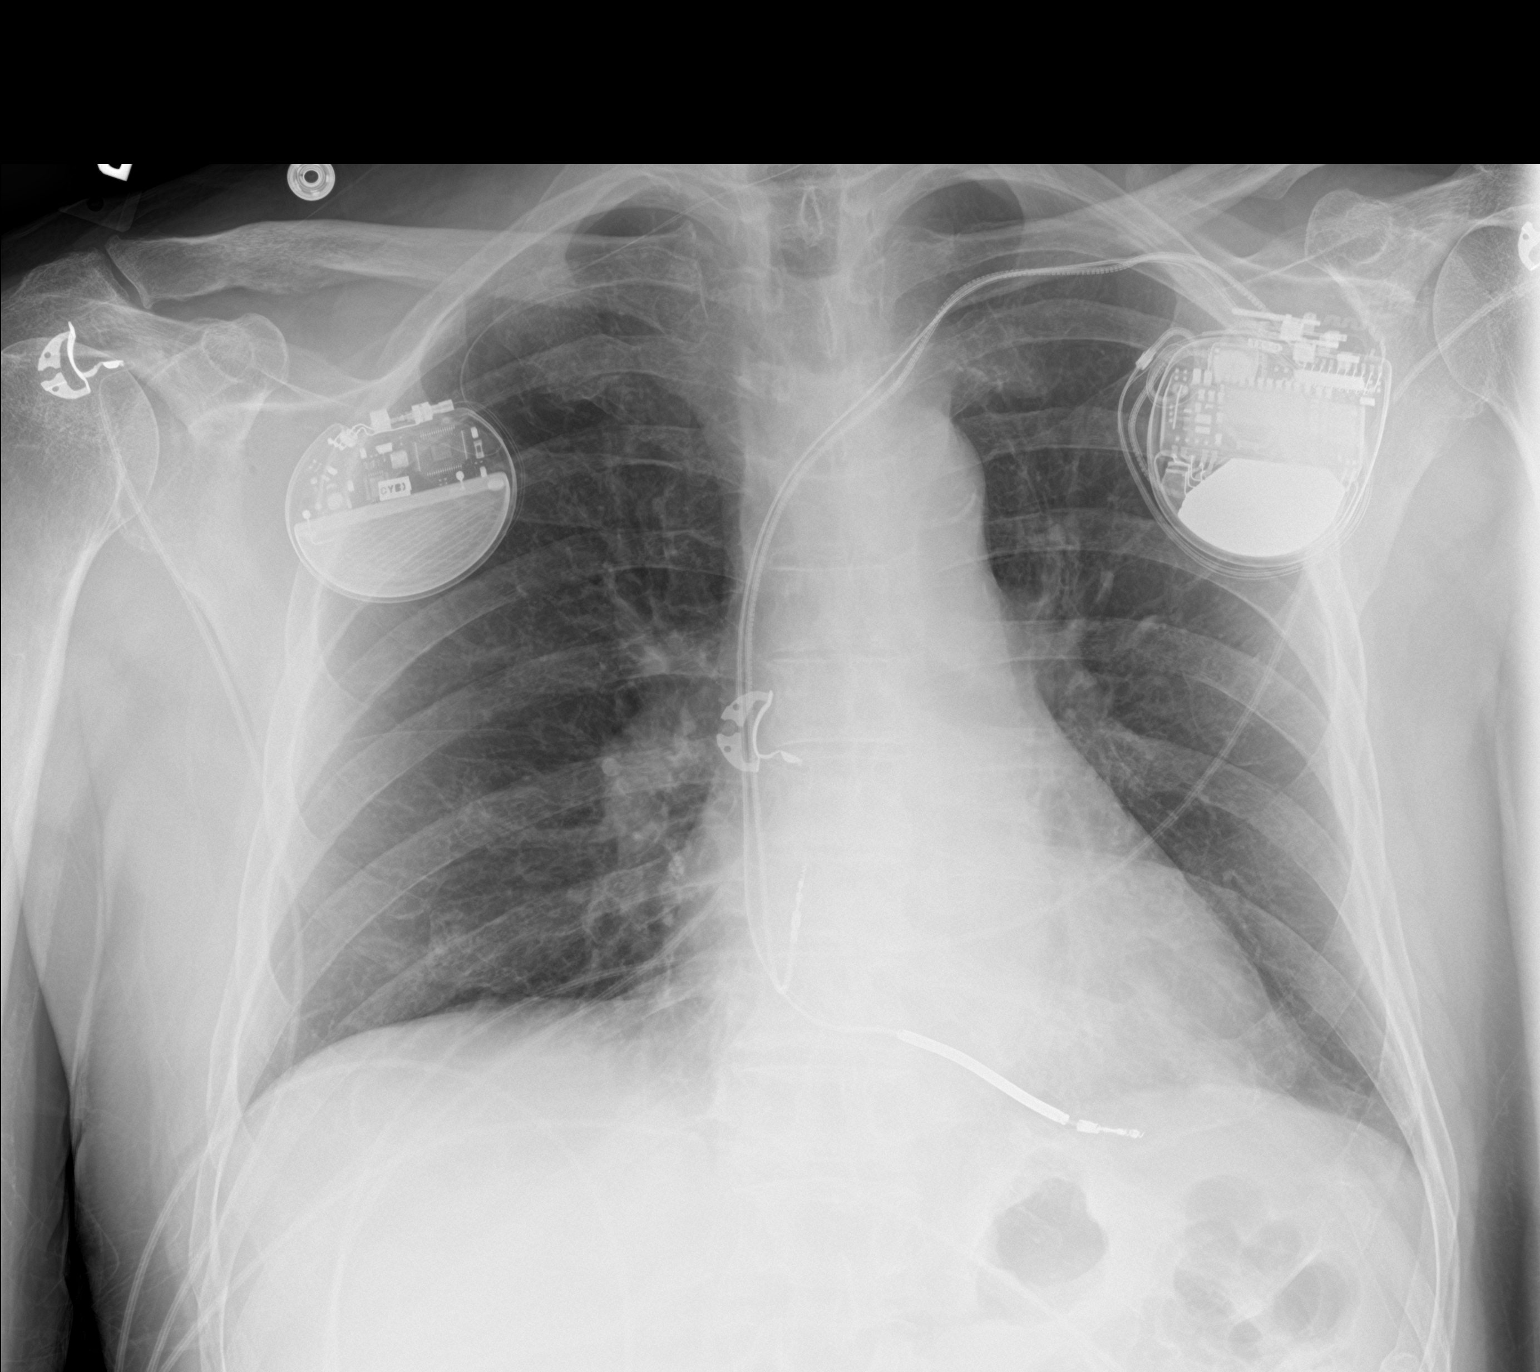

[2 of 2 positions shown; findings below may reference images not displayed]

FINDINGS: ICD and vagal nerve stimulator remain in place. The
cardiomediastinal silhouette is unchanged with normal heart size.
Aortic atherosclerosis is noted. The lungs are well inflated. There
is focal posterior basilar opacity on the lateral radiograph which
may be in the left lower lobe. The lungs are otherwise clear. No
pleural effusion or pneumothorax is identified. No acute osseous
abnormality is seen.
IMPRESSION: Basilar left lower lobe opacity which may reflect pneumonia.

## 2019-09-29 ENCOUNTER — Encounter: Payer: Self-pay | Admitting: Internal Medicine

## 2019-09-29 ENCOUNTER — Other Ambulatory Visit: Payer: Self-pay

## 2019-09-29 ENCOUNTER — Non-Acute Institutional Stay: Payer: Medicare Other | Admitting: Internal Medicine

## 2019-09-29 VITALS — BP 110/58 | HR 60 | Temp 97.9°F | Ht 68.0 in | Wt 175.0 lb

## 2019-09-29 DIAGNOSIS — R441 Visual hallucinations: Secondary | ICD-10-CM | POA: Diagnosis not present

## 2019-09-29 DIAGNOSIS — G40909 Epilepsy, unspecified, not intractable, without status epilepticus: Secondary | ICD-10-CM | POA: Diagnosis not present

## 2019-09-29 DIAGNOSIS — I4891 Unspecified atrial fibrillation: Secondary | ICD-10-CM | POA: Diagnosis not present

## 2019-09-29 DIAGNOSIS — D6869 Other thrombophilia: Secondary | ICD-10-CM

## 2019-09-29 DIAGNOSIS — G40919 Epilepsy, unspecified, intractable, without status epilepticus: Secondary | ICD-10-CM

## 2019-09-29 LAB — POCT INR
INR: 2.1 (ref 2.0–3.0)
INR: 2.1 — AB (ref <=1.1)

## 2019-09-29 MED ORDER — LORAZEPAM 2 MG/ML PO CONC: 1.0000 mg | Freq: Every day | ORAL | 0 refills | Status: DC | PRN

## 2019-09-29 NOTE — Progress Notes (Signed)
Location:  Occupational psychologist of Service:  Clinic (12)  Provider: Nimco Bivens L. Mariea Clonts, D.O., C.M.D.  Code Status: DNR, MOST Goals of Care:  Advanced Directives 09/29/2019  Does Patient Have a Medical Advance Directive? Yes  Type of Advance Directive Out of facility DNR (pink MOST or yellow form)  Does patient want to make changes to medical advance directive? No - Patient declined  Copy of [place] in Chart? -  Would patient like information on creating a medical advance directive? -  Pre-existing out of facility DNR order (yellow form or pink MOST form) Pink MOST/Yellow Form most recent copy in chart - Physician notified to receive inpatient order     Chief Complaint  Patient presents with  . Medical Management of Chronic Issues    4 week check up and INR check     HPI: Patient is a 84 y.o. male seen today for 4 week f/u INR check--it was 2.1 with goal of 2-3 on current coumadin 2.5mg  daily  Since his last visit, he had a seizure on Thursday 4/9 night.  SBAR nursing completed on 4/11 says he was still having postictal symptoms.  He was given a one time dose of ativan 1mg  IM and was "much better".  He was having hallucinations of dogs and children.   This is normal for him to have images that stick with him afterward--it was dogs and then he had 3-4 little children with him yesterday--they would not leave his apt.    He feels like his last two seizures have not been bad.  He talks again bout his two brain surgeries and the vagal stimulator.  If he uses the stimulator when he gets the aura/feeling of dread, it makes the seizure less severe.  He does not want to return to the hospital for seizures.  He'd left his fob and could not get back in after he went to the gym yesterday and had to walk around and staff.      Past Medical History:  Diagnosis Date  . Abnormality of gait 12/21/2013  . Atrial fibrillation ([place])   . Blind left eye   .  Carcinoma in situ of prostate   . Cardiac arrest ([place])   . Glaucoma   . Hypercholesteremia   . Hyperlipidemia   . Metabolic encephalopathy   . Mild left ventricular systolic dysfunction   . Presence of other specified functional implants   . PSA elevation   . Right frontal lobe lesion   . Seizures ([place])   . Tinea pedis   . 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 Urinary urgency     Past Surgical History:  Procedure Laterality Date  . CARDIAC DEFIBRILLATOR PLACEMENT    . cataract surgery    . COLONOSCOPY    . CRANIOTOMY Right    right anterior temporal resection  . defibrillator replaced  March 2016  . IMPLANTATION VAGAL NERVE STIMULATOR  June 2016  . NASAL SINUS SURGERY    . TONSILLECTOMY     age 57  . VASECTOMY  1985    Allergies  Allergen Reactions  . Depakote [Divalproex Sodium] Other (See Comments)    Arthralgias     Outpatient Encounter Medications as of 09/29/2019  Medication Sig  . acetaminophen (TYLENOL) 325 MG tablet Take 650 mg by mouth every 6 (six) hours as needed for mild pain or fever.   . ALPRAZolam (XANAX) 0.25 MG tablet Take 1 tablet (0.25 mg  total) by mouth 2 (two) times daily.  . brimonidine-timolol (COMBIGAN) 0.2-0.5 % ophthalmic solution Place 1 drop into both eyes every 12 (twelve) hours.   . cholecalciferol (VITAMIN D) 400 units TABS tablet Take 400 Units by mouth 2 (two) times daily.  . clobetasol cream (TEMOVATE) AB-123456789 % Apply 1 application topically 2 (two) times daily as needed (For Flares).  . finasteride (PROSCAR) 5 MG tablet Take 5 mg by mouth every morning.   . flecainide (TAMBOCOR) 50 MG tablet Take 50 mg by mouth 2 (two) times daily.  . fluocinonide ointment (LIDEX) AB-123456789 % Apply 1 application topically 2 (two) times daily as needed (on rash /skin folds, not for face).   Marland Kitchen ketoconazole (NIZORAL) 2 % shampoo Apply 1 application topically 2 (two) times a week. For seborrheic dermatitis on Monday and Thursday.  . Lacosamide (VIMPAT)  150 MG TABS Take 1 tablet (150 mg total) by mouth 2 (two) times daily.  Marland Kitchen latanoprost (XALATAN) 0.005 % ophthalmic solution Place 1 drop into both eyes at bedtime.  . levETIRAcetam (KEPPRA XR PO) Take 750 mg by mouth at bedtime.  . Levetiracetam (KEPPRA XR) 750 MG TB24 Two tablets at 6 pm, 1 tablet at bedtime  . LORazepam (ATIVAN) 2 MG/ML concentrated solution Give 2 mg IM for seizure.If continued seizure activity after 15 minutes, give 2 mg again as needed.  . phenytoin (DILANTIN) 100 MG ER capsule 1 capsule in the morning, 3 capsules in the evening  . polyethylene glycol (MIRALAX / GLYCOLAX) packet Take 17 g by mouth See admin instructions. Once daily every other day and as needed for constipation  . polyethylene glycol powder (GLYCOLAX/MIRALAX) 17 GM/SCOOP powder Take by mouth.  . sodium chloride 1 g tablet Take 1 tablet (1 g total) by mouth 2 (two) times daily with a meal.  . tamsulosin (FLOMAX) 0.4 MG CAPS capsule Take 0.4 mg by mouth every morning.  . triamcinolone cream (KENALOG) 0.1 % Apply 1 application topically 2 (two) times daily as needed (for flares).   . warfarin (COUMADIN) 2.5 MG tablet Take 1 tablet (2.5 mg total) by mouth daily at 12 noon.   No facility-administered encounter medications on file as of 09/29/2019.    Review of Systems:  Review of Systems  Constitutional: Positive for malaise/fatigue. Negative for chills and fever.  HENT: Negative for congestion and sore throat.   Eyes: Positive for blurred vision and double vision.  Respiratory: Negative for cough and shortness of breath.   Cardiovascular: Negative for chest pain, palpitations and leg swelling.  Gastrointestinal: Negative for abdominal pain, blood in stool, constipation, diarrhea and melena.  Genitourinary: Negative for dysuria.  Musculoskeletal: Positive for falls. Negative for joint pain and myalgias.  Skin: Positive for itching and rash.  Neurological: Positive for seizures. Negative for dizziness, focal  weakness, loss of consciousness and weakness.  Endo/Heme/Allergies: Positive for environmental allergies. Bruises/bleeds easily.  Psychiatric/Behavioral: Positive for memory loss. Negative for depression. The patient is not nervous/anxious and does not have insomnia.     Health Maintenance  Topic Date Due  . INFLUENZA VACCINE  01/16/2020  . TETANUS/TDAP  12/06/2025  . PNA vac Low Risk Adult  Completed    Physical Exam: Vitals:   09/29/19 1145  Weight: 175 lb (79.4 kg)  Height: 5\' 8"  (1.727 m)   Body mass index is 26.61 kg/m. Physical Exam Vitals reviewed.  Constitutional:      Appearance: Normal appearance.  HENT:     Head: Normocephalic and atraumatic.  Cardiovascular:  Rate and Rhythm: Normal rate and regular rhythm.     Pulses: Normal pulses.     Heart sounds: Normal heart sounds.  Pulmonary:     Effort: Pulmonary effort is normal.     Breath sounds: Normal breath sounds. No wheezing, rhonchi or rales.  Abdominal:     General: Bowel sounds are normal.  Musculoskeletal:        General: Normal range of motion.     Right lower leg: No edema.     Left lower leg: No edema.     Comments: Walks with rollator walker  Skin:    General: Skin is warm and dry.  Neurological:     General: No focal deficit present.     Mental Status: He is alert and oriented to person, place, and time.  Psychiatric:        Mood and Affect: Mood normal.     Labs reviewed: Basic Metabolic Panel: Recent Labs    01/20/19 1141 01/29/19 0012 01/29/19 2218 02/01/19 0455 02/01/19 0455 02/02/19 0157 02/04/19 0000 02/16/19 0000 08/02/19 1128  NA   < >  --    < > 127*   < > 127* 125* 137 137  K   < >  --    < > 4.1   < > 3.9 4.0 4.6 5.1  CL  --   --    < > 98  --  96*  --   --  102  CO2  --   --    < > 22  --  23  --   --  17*  GLUCOSE  --   --    < > 98  --  92  --   --  84  BUN   < >  --    < > 7*   < > 7* 9 13 14   CREATININE  --   --    < > 0.63   < > 0.64 0.7 0.6 0.81  CALCIUM   --   --    < > 8.4*  --  8.3*  --   --  9.1  MG  --  1.9  --   --   --   --   --   --   --    < > = values in this interval not displayed.   Liver Function Tests: Recent Labs    10/05/18 2016 10/05/18 2016 11/24/18 0300 01/20/19 1141 08/02/19 1128  AST 29   < > 26 25 31   ALT 23   < > 24 19 16   ALKPHOS 171*   < > 125 145* 156*  BILITOT 0.3  --   --  0.3 <0.2  PROT 7.9  --   --  7.2 7.6  ALBUMIN 4.6  --   --  4.6 4.9*   < > = values in this interval not displayed.   No results for input(s): LIPASE, AMYLASE in the last 8760 hours. No results for input(s): AMMONIA in the last 8760 hours. CBC: Recent Labs    11/04/18 0247 01/20/19 1141 01/29/19 2218 01/30/19 0638 08/02/19 1128  WBC   < > 4.8 9.4 6.3 5.7  NEUTROABS  --  2.2 6.1  --  2.7  HGB   < > 12.7* 11.6* 11.2* 13.9  HCT   < > 36.1* 32.7* 31.2* 38.9  MCV   < > 91 90.8 89.9 92  PLT   < > 282 248 238  225   < > = values in this interval not displayed.    Assessment/Plan 1. Hypercoagulability due to atrial fibrillation (HCC) -on coumadin 2.5mg  daily - POC INR was 2.1 today -cont same dose of coumadin and recheck INR in 4 wks  2. Seizure disorder ([place]) - just had another seizure last week - still having minor speech difficulty and malaise from it - had more postictal difficulty with hallucinations that was actually reported to me (he says he always has that, but staff have never mentioned it or noted it in his chart before this time) -he was not tolerating them well and requested treatment with lorazepam so new order for anxiety related to his postictal hallucinations provided - LORazepam (LORAZEPAM INTENSOL) 2 MG/ML concentrated solution; Take 0.5 mLs (1 mg total) by mouth daily as needed (postictal anxiety).  Dispense: 30 mL; Refill: 0 -his vagal stimulator was used at the time, as well as a dose of ativan which is only needed if he continues to seize after the stimulator is swiped  3. Hallucinations, visual - due to  seizures/postictal phase - LORazepam (LORAZEPAM INTENSOL) 2 MG/ML concentrated solution; Take 0.5 mLs (1 mg total) by mouth daily as needed (postictal anxiety).  Dispense: 30 mL; Refill: 0  4. Refractory epilepsy ([place]) - ongoing despite two surgeries, trying numerous different meds and seeing numerous different specialists, has vagal stimulator which does seem to decrease the severity  -prn ativan if seizure persists -has MOST form to be kept comfortable here, NOT go to the hospital  - LORazepam (ATIVAN) 2 MG/ML concentrated solution; Give 2 mg IM for seizure.If continued seizure activity after 15 minutes, give 2 mg again as needed.  Dispense: 30 mL; Refill: 0  Labs/tests ordered:   Lab Orders     POC INR  Next appt:  5/12 at [facility]. Hailey Miles, D.O. [facility] [address], [place] [address] Cell Phone (Mon-Fri 8am-5pm):  [phone] On Call:  [phone] & follow prompts after 5pm & weekends Office Phone:  [phone] Office Fax:  [phone]

## 2019-09-29 NOTE — Progress Notes (Signed)
INR is therapeutic.  Continue coumadin 2.5mg  daily and recheck INR at [facility] in 4 weeks

## 2019-10-01 ENCOUNTER — Encounter: Payer: Self-pay | Admitting: Adult Health

## 2019-10-01 ENCOUNTER — Non-Acute Institutional Stay: Payer: Medicare Other | Admitting: Adult Health

## 2019-10-01 DIAGNOSIS — M546 Pain in thoracic spine: Secondary | ICD-10-CM

## 2019-10-01 DIAGNOSIS — G40911 Epilepsy, unspecified, intractable, with status epilepticus: Secondary | ICD-10-CM | POA: Diagnosis not present

## 2019-10-01 DIAGNOSIS — M545 Low back pain: Secondary | ICD-10-CM | POA: Diagnosis not present

## 2019-10-01 DIAGNOSIS — M4850XA Collapsed vertebra, not elsewhere classified, site unspecified, initial encounter for fracture: Secondary | ICD-10-CM | POA: Diagnosis not present

## 2019-10-01 DIAGNOSIS — R296 Repeated falls: Secondary | ICD-10-CM

## 2019-10-01 DIAGNOSIS — R569 Unspecified convulsions: Secondary | ICD-10-CM | POA: Diagnosis not present

## 2019-10-01 NOTE — Progress Notes (Signed)
Location:  Occupational psychologist of Service:  ALF (13) Provider:   Cindi Carbon, ANP [place] [phone]   Gayland Curry, DO  Patient Care Team: Gayland Curry, DO as PCP - General (Geriatric Medicine) Rutherford Guys, MD as Consulting Physician (Ophthalmology) Kathrynn Ducking, MD as Consulting Physician (Neurology)  Extended Emergency Contact Information Primary Emergency Contact: Senegal,[name] Address: [address] [place] [address] Johnnette Litter of [place] Phone: [phone] Mobile Phone: [phone] Relation: Spouse Secondary Emergency Contact: Gaona,Wilman Mobile Phone: [phone] Relation: Son  Code Status:  DNR Goals of care: Advanced Directive information Advanced Directives 09/29/2019  Does Patient Have a Medical Advance Directive? Yes  Type of Advance Directive Out of facility DNR (pink MOST or yellow form);Living will  Does patient want to make changes to medical advance directive? No - Patient declined  Copy of [place] in Chart? -  Would patient like information on creating a medical advance directive? -  Pre-existing out of facility DNR order (yellow form or pink MOST form) Pink MOST/Yellow Form most recent copy in chart - Physician notified to receive inpatient order     Chief Complaint  Patient presents with  . Acute Visit    thoracic back pain    HPI:  Pt is a 84 y.o. male seen today for an acute visit for thoracic back pain. He has a hx of seizures and frequent falls. He has had two seizures in the past week. He also fell in the dining room on 4/15 with his family and hit his thoracic area on a chair. Afterward he reported back pain. I was on oncall last evening and I ordered a xray which showed the following:  Lumbar and thoracic spine xray 09/30/19: degenerative changes and mild scoliosis, minor T11 compression fracture seen. L2 compression fracture seen probably  chronic   Mr. Runck is using tylenol for pain and feels that it helps. He is up with a walker and able to ambulate. There is no radicular pain, no numbness, tingling, fever, or change in bowel or bladder habits.   Dilantin level was ordered this morning and is pending. He is followed by neurology and Dr Mariea Clonts for seizures.   Past Medical History:  Diagnosis Date  . Abnormality of gait 12/21/2013  . Atrial fibrillation ([place])   . Blind left eye   . Carcinoma in situ of prostate   . Cardiac arrest ([place])   . Glaucoma   . Hypercholesteremia   . Hyperlipidemia   . Metabolic encephalopathy   . Mild left ventricular systolic dysfunction   . Presence of other specified functional implants   . PSA elevation   . Right frontal lobe lesion   . Seizures ([place])   . Tinea pedis   . 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 Urinary urgency    Past Surgical History:  Procedure Laterality Date  . CARDIAC DEFIBRILLATOR PLACEMENT    . cataract surgery    . COLONOSCOPY    . CRANIOTOMY Right    right anterior temporal resection  . defibrillator replaced  March 2016  . IMPLANTATION VAGAL NERVE STIMULATOR  June 2016  . NASAL SINUS SURGERY    . TONSILLECTOMY     age 70  . VASECTOMY  1985    Allergies  Allergen Reactions  . Depakote [Divalproex Sodium] Other (See Comments)    Arthralgias  Outpatient Encounter Medications as of 10/01/2019  Medication Sig  . acetaminophen (TYLENOL) 325 MG tablet Take 650 mg by mouth every 6 (six) hours as needed for mild pain or fever.   . ALPRAZolam (XANAX) 0.25 MG tablet Take 1 tablet (0.25 mg total) by mouth 2 (two) times daily.  . brimonidine-timolol (COMBIGAN) 0.2-0.5 % ophthalmic solution Place 1 drop into both eyes every 12 (twelve) hours.   . cholecalciferol (VITAMIN D) 400 units TABS tablet Take 400 Units by mouth 2 (two) times daily.  . clobetasol cream (TEMOVATE) AB-123456789 % Apply 1 application topically 2 (two) times daily as needed (For Flares).    . finasteride (PROSCAR) 5 MG tablet Take 5 mg by mouth every morning.   . flecainide (TAMBOCOR) 50 MG tablet Take 50 mg by mouth 2 (two) times daily.  . fluocinonide ointment (LIDEX) AB-123456789 % Apply 1 application topically 2 (two) times daily as needed (on rash /skin folds, not for face).   Marland Kitchen ketoconazole (NIZORAL) 2 % shampoo Apply 1 application topically 2 (two) times a week. For seborrheic dermatitis on Monday and Thursday.  . Lacosamide (VIMPAT) 150 MG TABS Take 1 tablet (150 mg total) by mouth 2 (two) times daily.  Marland Kitchen latanoprost (XALATAN) 0.005 % ophthalmic solution Place 1 drop into both eyes at bedtime.  . levETIRAcetam (KEPPRA XR PO) Take 750 mg by mouth at bedtime.  . Levetiracetam (KEPPRA XR) 750 MG TB24 Two tablets at 6 pm, 1 tablet at bedtime  . LORazepam (ATIVAN) 2 MG/ML concentrated solution Give 2 mg IM for seizure.If continued seizure activity after 15 minutes, give 2 mg again as needed.  Marland Kitchen LORazepam (LORAZEPAM INTENSOL) 2 MG/ML concentrated solution Take 0.5 mLs (1 mg total) by mouth daily as needed (postictal anxiety).  . phenytoin (DILANTIN) 100 MG ER capsule 1 capsule in the morning, 3 capsules in the evening  . polyethylene glycol (MIRALAX / GLYCOLAX) packet Take 17 g by mouth See admin instructions. Once daily every other day and as needed for constipation  . polyethylene glycol powder (GLYCOLAX/MIRALAX) 17 GM/SCOOP powder Take by mouth.  . sodium chloride 1 g tablet Take 1 tablet (1 g total) by mouth 2 (two) times daily with a meal.  . tamsulosin (FLOMAX) 0.4 MG CAPS capsule Take 0.4 mg by mouth every morning.  . triamcinolone cream (KENALOG) 0.1 % Apply 1 application topically 2 (two) times daily as needed (for flares).   . warfarin (COUMADIN) 2.5 MG tablet Take 1 tablet (2.5 mg total) by mouth daily at 12 noon.   No facility-administered encounter medications on file as of 10/01/2019.    Review of Systems  Constitutional: Positive for activity change. Negative for  appetite change, chills, diaphoresis, fatigue, fever and unexpected weight change.  Musculoskeletal: Positive for back pain and gait problem. Negative for arthralgias, joint swelling, myalgias and neck pain.  Skin: Negative for color change and wound.  Neurological: Negative for weakness and numbness.    Immunization History  Administered Date(s) Administered  . Influenza, Seasonal, Injecte, Preservative Fre 02/22/2010  . Influenza,inj,Quad PF,6+ Mos 04/07/2018, 04/15/2019  . Influenza-Unspecified 03/17/2013, 02/24/2014, 02/16/2015, 03/21/2016, 03/17/2017, 04/07/2018  . Moderna SARS-COVID-2 Vaccination 06/28/2019, 07/27/2019  . Pneumococcal Conjugate-13 07/05/2013  . Pneumococcal Polysaccharide-23 06/17/2006  . Td 06/18/2007  . Tdap 12/07/2015  . Zoster 10/06/2008  . Zoster Recombinat (Shingrix) 12/04/2016   Pertinent  Health Maintenance Due  Topic Date Due  . INFLUENZA VACCINE  01/16/2020  . PNA vac Low Risk Adult  Completed   Fall  Risk  09/29/2019 08/25/2019 07/21/2019 06/15/2019 05/19/2019  Falls in the past year? 1 - 0 1 -  Number falls in past yr: 1 0 0 1 -  Injury with Fall? 0 0 0 0 -  Risk for fall due to : - - - History of fall(s);Impaired balance/gait;Impaired mobility History of fall(s);Impaired balance/gait;Impaired vision;Medication side effect;Impaired mobility;Mental status change  Follow up - - - Falls evaluation completed;Falls prevention discussed Falls evaluation completed;Education provided;Falls prevention discussed  Comment - - - - at [facility] and with rehab team   Functional Status Survey:    Vitals:   10/01/19 1248  BP: 140/82  Pulse: 60  Resp: 18  Temp: (!) 97 F (36.1 C)  SpO2: 91%   There is no height or weight on file to calculate BMI. Physical Exam Vitals and nursing note reviewed.  Constitutional:      General: He is not in acute distress.    Appearance: He is not diaphoretic.  HENT:     Head: Normocephalic and atraumatic.  Neck:      Thyroid: No thyromegaly.     Vascular: No JVD.     Trachea: No tracheal deviation.  Cardiovascular:     Rate and Rhythm: Normal rate and regular rhythm.     Heart sounds: No murmur.  Pulmonary:     Effort: Pulmonary effort is normal. No respiratory distress.     Breath sounds: Normal breath sounds. No wheezing.  Abdominal:     General: Bowel sounds are normal. There is no distension.     Palpations: Abdomen is soft.     Tenderness: There is no abdominal tenderness.  Musculoskeletal:        General: No swelling or tenderness.     Right lower leg: No edema.     Left lower leg: No edema.     Comments: No spinal process tenderness. Neg SLR. MAE> Strength 5/5 to BUE and BLE.   Lymphadenopathy:     Cervical: No cervical adenopathy.  Skin:    General: Skin is warm and dry.     Findings: No erythema.  Neurological:     Mental Status: He is alert and oriented to person, place, and time.     Cranial Nerves: No cranial nerve deficit.  Psychiatric:        Mood and Affect: Mood normal.     Labs reviewed: Recent Labs    01/20/19 1141 01/29/19 0012 01/29/19 2218 02/01/19 0455 02/01/19 0455 02/02/19 0157 02/04/19 0000 02/16/19 0000 08/02/19 1128  NA   < >  --    < > 127*   < > 127* 125* 137 137  K   < >  --    < > 4.1   < > 3.9 4.0 4.6 5.1  CL  --   --    < > 98  --  96*  --   --  102  CO2  --   --    < > 22  --  23  --   --  17*  GLUCOSE  --   --    < > 98  --  92  --   --  84  BUN   < >  --    < > 7*   < > 7* 9 13 14   CREATININE  --   --    < > 0.63   < > 0.64 0.7 0.6 0.81  CALCIUM  --   --    < >  8.4*  --  8.3*  --   --  9.1  MG  --  1.9  --   --   --   --   --   --   --    < > = values in this interval not displayed.   Recent Labs    10/05/18 2016 10/05/18 2016 11/24/18 0300 01/20/19 1141 08/02/19 1128  AST 29   < > 26 25 31   ALT 23   < > 24 19 16   ALKPHOS 171*   < > 125 145* 156*  BILITOT 0.3  --   --  0.3 <0.2  PROT 7.9  --   --  7.2 7.6  ALBUMIN 4.6  --   --   4.6 4.9*   < > = values in this interval not displayed.   Recent Labs    11/04/18 0247 01/20/19 1141 01/29/19 2218 01/30/19 0638 08/02/19 1128  WBC   < > 4.8 9.4 6.3 5.7  NEUTROABS  --  2.2 6.1  --  2.7  HGB   < > 12.7* 11.6* 11.2* 13.9  HCT   < > 36.1* 32.7* 31.2* 38.9  MCV   < > 91 90.8 89.9 92  PLT   < > 282 248 238 225   < > = values in this interval not displayed.   No results found for: TSH No results found for: HGBA1C No results found for: CHOL, HDL, LDLCALC, LDLDIRECT, TRIG, CHOLHDL  Significant Diagnostic Results in last 30 days:  No results found.  Assessment/Plan 1. Acute midline thoracic back pain Due to fall with no acute fracture identified or neuro findings related to the fall on xray Tylenol 650 mg tid x 72 hrs Lidocaine Aspercreme patch on in the am and off in the pm  Report if no improvement or worsening in symptoms  2. Frequent falls Consider home care for the weekend for additional support Use walker at all times Call for help when getting up Place sign in room to remind him to call for help before getting up  3. Old compression fractures noted to lumbar and thoracic spine May have been aggravated during his fall  Could consider dexa scan and treatment for OP in the future but this may be difficult to manage with meds due to his issues with seizures   Family/ staff Communication: discussed with Mr. Dun and his daughter at the bedside   Labs/tests ordered:  NA

## 2019-10-02 ENCOUNTER — Telehealth: Payer: Self-pay | Admitting: Neurology

## 2019-10-02 DIAGNOSIS — Z5181 Encounter for therapeutic drug level monitoring: Secondary | ICD-10-CM

## 2019-10-02 MED ORDER — PHENYTOIN SODIUM EXTENDED 100 MG PO CAPS: ORAL_CAPSULE | ORAL | 5 refills | Status: DC

## 2019-10-02 MED ORDER — PHENYTOIN SODIUM EXTENDED 30 MG PO CAPS: 60.0000 mg | ORAL_CAPSULE | Freq: Every morning | ORAL | 5 refills | Status: DC

## 2019-10-02 MED ORDER — LORAZEPAM 2 MG/ML PO CONC: ORAL | 0 refills | Status: DC

## 2019-10-02 NOTE — Telephone Encounter (Signed)
I called and talk with [facility].  The patient had a Dilantin level yesterday that came back at 23, in the low toxic range.  The patient has had 2 seizures over the last couple weeks.  We will reduce the Dilantin level slightly, he currently is on 100 mg capsules taking 1 in the morning and 3 in the evening.  I will reduce the dose taking 300 mg at night and 60 mg during the daytime.  He was given a prescription for the 30 mg capsules.

## 2019-10-04 NOTE — Telephone Encounter (Signed)
Cheri from [facility] called wanting to report the seizures the pt had one on 4/19 and one on 4/15 and she states about 30 min after this seizure on the 15th pt fell but there were no injuries. Cheri also stated that there is no order for a follow up redraw of the Dilantin Level's and her supervisor is requesting that. Please advise. Order can be faxed to [phone] or you can call Cheri at [phone]

## 2019-10-05 NOTE — Telephone Encounter (Signed)
I called Chanta at [facility] need future lab orders for pt because of the change of dosage of medication. Pt has started taking the new dosage recommended by Dr Jannifer Franklin. I stated Dr. Jannifer Franklin will return on Monday to put future lab orders on order form from [facility] . Chante verbalized understanding.

## 2019-10-08 ENCOUNTER — Other Ambulatory Visit: Payer: Self-pay | Admitting: Adult Health

## 2019-10-08 MED ORDER — VIMPAT 150 MG PO TABS: 150.0000 mg | ORAL_TABLET | Freq: Two times a day (BID) | ORAL | 3 refills | Status: DC

## 2019-10-08 MED ORDER — ALPRAZOLAM 0.25 MG PO TABS: 0.2500 mg | ORAL_TABLET | Freq: Two times a day (BID) | ORAL | 5 refills | Status: DC

## 2019-10-11 NOTE — Telephone Encounter (Signed)
Dilantin lab orders and dilantin prescription written on order sheet from [facility] and fax twice to [phone] twice and confirmed.

## 2019-10-25 DIAGNOSIS — R569 Unspecified convulsions: Secondary | ICD-10-CM | POA: Diagnosis not present

## 2019-10-25 DIAGNOSIS — G4089 Other seizures: Secondary | ICD-10-CM | POA: Diagnosis not present

## 2019-10-26 NOTE — Addendum Note (Signed)
Addended by: Kathrynn Ducking on: 10/26/2019 01:58 PM   Modules accepted: Orders

## 2019-10-26 NOTE — Telephone Encounter (Signed)
I will go ahead and get this ordered.

## 2019-10-26 NOTE — Telephone Encounter (Signed)
Pt called to check on the status of his dilantin lab results. Pt states he would also like to get additional lab testing done to check his keppra and sodium levels and was unsure if orders could be put in for him to complete

## 2019-10-27 ENCOUNTER — Non-Acute Institutional Stay: Payer: Medicare Other | Admitting: Internal Medicine

## 2019-10-27 ENCOUNTER — Other Ambulatory Visit: Payer: Self-pay

## 2019-10-27 ENCOUNTER — Encounter: Payer: Self-pay | Admitting: Internal Medicine

## 2019-10-27 VITALS — BP 118/72 | HR 65 | Temp 97.5°F | Ht 68.0 in | Wt 179.2 lb

## 2019-10-27 DIAGNOSIS — D6869 Other thrombophilia: Secondary | ICD-10-CM | POA: Diagnosis not present

## 2019-10-27 DIAGNOSIS — I4891 Unspecified atrial fibrillation: Secondary | ICD-10-CM | POA: Diagnosis not present

## 2019-10-27 DIAGNOSIS — G40919 Epilepsy, unspecified, intractable, without status epilepticus: Secondary | ICD-10-CM

## 2019-10-27 DIAGNOSIS — E871 Hypo-osmolality and hyponatremia: Secondary | ICD-10-CM

## 2019-10-27 LAB — POCT INR: INR: 1.7 — AB (ref 2.0–3.0)

## 2019-10-27 NOTE — Telephone Encounter (Signed)
Pt states that when taking sodium he is feeling nauseas and throws up and will stop taking and plans to just add more salt to his food. Pt requested a CB to discuss  Pt states he would also like to speak MD.   informed of RN message below in regards to labs.

## 2019-10-27 NOTE — Progress Notes (Signed)
Take extra 2.5mg  once today, then resume 2.5mg  daily going forward. Recheck INR in 4 wks I'm to be notified if seizure meds are changed b/c it affects his warfarin every time

## 2019-10-27 NOTE — Progress Notes (Signed)
Location:  [facility] clinic Provider:  Odena Mcquaid L. Mariea Clonts, D.O., C.M.D.  Code Status: DNR Goals of Care:  Advanced Directives 10/27/2019  Does Patient Have a Medical Advance Directive? Yes  Type of Paramedic of [place];Out of facility DNR (pink MOST or yellow form);Living will  Does patient want to make changes to medical advance directive? No - Patient declined  Copy of [place] in Chart? Yes - validated most recent copy scanned in chart (See row information)  Would patient like information on creating a medical advance directive? -  Pre-existing out of facility DNR order (yellow form or pink MOST form) -     Chief Complaint  Patient presents with  . Medical Management of Chronic Issues    INR check     HPI: Patient is a 84 y.o. male seen today for medical management of chronic diseases.    INR was low at 1.7 with goal 2-3.  He does not think he had anymore greens than usual.  He c/o his easy bruising.  He is on warfarin 2.5mg  daily at noon.    He tested his vagal stimulator today and he felt the sensation.   He had his keppra level checked yesterday.  Dilantin level had been marginally high so dose was reduced to two 30mg  in the morning and 300mg  in the evening.  Keppra level was checked yesterday and pending.    The sodium tablets make him a little nauseous (he didn't before).  CMP is pending.    He had a fall about 10 days ago.  He hurt his "back" and had xrays.  He is doing ok getting around with his walker.    He is due to see Dr. Gershon Crane in f/u for his glaucoma.  He talks about walking back to his home where his wife still stays which I advised against.  Past Medical History:  Diagnosis Date  . Abnormality of gait 12/21/2013  . Atrial fibrillation ([place])   . Blind left eye   . Carcinoma in situ of prostate   . Cardiac arrest ([place])   . Glaucoma   . Hypercholesteremia   . Hyperlipidemia   . Metabolic encephalopathy   . Mild left  ventricular systolic dysfunction   . Presence of other specified functional implants   . PSA elevation   . Right frontal lobe lesion   . Seizures ([place])   . Tinea pedis   . 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 Urinary urgency     Past Surgical History:  Procedure Laterality Date  . CARDIAC DEFIBRILLATOR PLACEMENT    . cataract surgery    . COLONOSCOPY    . CRANIOTOMY Right    right anterior temporal resection  . defibrillator replaced  March 2016  . IMPLANTATION VAGAL NERVE STIMULATOR  June 2016  . NASAL SINUS SURGERY    . TONSILLECTOMY     age 53  . VASECTOMY  1985    Allergies  Allergen Reactions  . Depakote [Divalproex Sodium] Other (See Comments)    Arthralgias     Outpatient Encounter Medications as of 10/27/2019  Medication Sig  . ALPRAZolam (XANAX) 0.25 MG tablet Take 1 tablet (0.25 mg total) by mouth 2 (two) times daily.  . brimonidine-timolol (COMBIGAN) 0.2-0.5 % ophthalmic solution Place 1 drop into both eyes every 12 (twelve) hours.   . cholecalciferol (VITAMIN D) 400 units TABS tablet Take 400 Units by mouth 2 (two) times daily.  . clobetasol cream (TEMOVATE)  AB-123456789 % Apply 1 application topically 2 (two) times daily as needed (For Flares).  . finasteride (PROSCAR) 5 MG tablet Take 5 mg by mouth every morning.   . flecainide (TAMBOCOR) 50 MG tablet Take 50 mg by mouth 2 (two) times daily.  . fluocinonide ointment (LIDEX) AB-123456789 % Apply 1 application topically 2 (two) times daily as needed (on rash /skin folds, not for face).   Marland Kitchen ketoconazole (NIZORAL) 2 % shampoo Apply 1 application topically 2 (two) times a week. For seborrheic dermatitis on Monday and Thursday.  . Lacosamide (VIMPAT) 150 MG TABS Take 1 tablet (150 mg total) by mouth 2 (two) times daily.  Marland Kitchen latanoprost (XALATAN) 0.005 % ophthalmic solution Place 1 drop into both eyes at bedtime.  . levETIRAcetam (KEPPRA XR PO) Take 750 mg by mouth at bedtime.  . Levetiracetam (KEPPRA XR) 750 MG TB24 Two  tablets at 6 pm, 1 tablet at bedtime  . Lidocaine (ASPERCREME LIDOCAINE) 4 % PTCH Apply 1 application topically in the morning and at bedtime. On in the am and off in the pm  . LORazepam (ATIVAN) 2 MG/ML concentrated solution Give 2 mg IM for seizure.If continued seizure activity after 15 minutes, give 2 mg again as needed.  Marland Kitchen LORazepam (LORAZEPAM INTENSOL) 2 MG/ML concentrated solution Take 0.5 mLs (1 mg total) by mouth daily as needed (postictal anxiety).  . phenytoin (DILANTIN) 100 MG ER capsule 3 capsules in the evening  . phenytoin (DILANTIN) 30 MG ER capsule Take 2 capsules (60 mg total) by mouth in the morning.  . polyethylene glycol (MIRALAX / GLYCOLAX) packet Take 17 g by mouth See admin instructions. Once daily every other day and as needed for constipation  . polyethylene glycol powder (GLYCOLAX/MIRALAX) 17 GM/SCOOP powder Take by mouth.  . sodium chloride 1 g tablet Take 1 tablet (1 g total) by mouth 2 (two) times daily with a meal.  . tamsulosin (FLOMAX) 0.4 MG CAPS capsule Take 0.4 mg by mouth every morning.  . triamcinolone cream (KENALOG) 0.1 % Apply 1 application topically 2 (two) times daily as needed (for flares).   . warfarin (COUMADIN) 2.5 MG tablet Take 1 tablet (2.5 mg total) by mouth daily at 12 noon.   No facility-administered encounter medications on file as of 10/27/2019.    Review of Systems:  Review of Systems  Constitutional: Positive for malaise/fatigue. Negative for chills and fever.  HENT: Positive for hearing loss. Negative for congestion.        Hearing aids  Eyes: Positive for blurred vision.  Respiratory: Negative for cough and shortness of breath.   Cardiovascular: Negative for chest pain, palpitations and leg swelling.  Gastrointestinal: Negative for abdominal pain, constipation and diarrhea.  Genitourinary: Negative for dysuria.  Musculoskeletal: Positive for falls. Negative for back pain and joint pain.  Neurological: Positive for seizures. Negative  for dizziness and loss of consciousness.  Endo/Heme/Allergies: Bruises/bleeds easily.  Psychiatric/Behavioral: Positive for memory loss. Negative for depression. The patient is not nervous/anxious and does not have insomnia.     Health Maintenance  Topic Date Due  . INFLUENZA VACCINE  01/16/2020  . TETANUS/TDAP  12/06/2025  . COVID-19 Vaccine  Completed  . PNA vac Low Risk Adult  Completed    Physical Exam: Vitals:   10/27/19 1406  BP: 118/72  Pulse: 65  Temp: (!) 97.5 F (36.4 C)  TempSrc: Temporal  SpO2: 92%  Weight: 179 lb 3.2 oz (81.3 kg)  Height: 5\' 8"  (1.727 m)   Body mass  index is 27.25 kg/m. Physical Exam Vitals reviewed.  Constitutional:      General: He is not in acute distress.    Appearance: Normal appearance. He is not toxic-appearing.  HENT:     Head: Normocephalic and atraumatic.  Cardiovascular:     Rate and Rhythm: Rhythm irregular.     Heart sounds: No murmur.  Pulmonary:     Effort: Pulmonary effort is normal.     Breath sounds: Normal breath sounds. No wheezing, rhonchi or rales.  Abdominal:     General: Bowel sounds are normal.  Musculoskeletal:        General: Normal range of motion.     Right lower leg: No edema.     Left lower leg: No edema.  Skin:    Comments: Multiple areas of bruising on arms  Neurological:     General: No focal deficit present.     Mental Status: He is alert and oriented to person, place, and time.     Comments: Walking with rollator walker  Psychiatric:        Mood and Affect: Mood normal.     Labs reviewed: Basic Metabolic Panel: Recent Labs    01/20/19 1141 01/29/19 0012 01/29/19 2218 02/01/19 0455 02/01/19 0455 02/02/19 0157 02/04/19 0000 02/16/19 0000 08/02/19 1128  NA   < >  --    < > 127*   < > 127* 125* 137 137  K   < >  --    < > 4.1   < > 3.9 4.0 4.6 5.1  CL  --   --    < > 98  --  96*  --   --  102  CO2  --   --    < > 22  --  23  --   --  17*  GLUCOSE  --   --    < > 98  --  92  --   --   84  BUN   < >  --    < > 7*   < > 7* 9 13 14   CREATININE  --   --    < > 0.63   < > 0.64 0.7 0.6 0.81  CALCIUM  --   --    < > 8.4*  --  8.3*  --   --  9.1  MG  --  1.9  --   --   --   --   --   --   --    < > = values in this interval not displayed.   Liver Function Tests: Recent Labs    11/24/18 0300 01/20/19 1141 08/02/19 1128  AST 26 25 31   ALT 24 19 16   ALKPHOS 125 145* 156*  BILITOT  --  0.3 <0.2  PROT  --  7.2 7.6  ALBUMIN  --  4.6 4.9*   No results for input(s): LIPASE, AMYLASE in the last 8760 hours. No results for input(s): AMMONIA in the last 8760 hours. CBC: Recent Labs    11/04/18 0247 01/20/19 1141 01/29/19 2218 01/30/19 0638 08/02/19 1128  WBC   < > 4.8 9.4 6.3 5.7  NEUTROABS  --  2.2 6.1  --  2.7  HGB   < > 12.7* 11.6* 11.2* 13.9  HCT   < > 36.1* 32.7* 31.2* 38.9  MCV   < > 91 90.8 89.9 92  PLT   < > 282 248 238 225   < > =  values in this interval not displayed.   Assessment/Plan 1. Hypercoagulability due to atrial fibrillation ([place]) -take a second dose of 2.5mg  warfarin just today, then resume usual 2.5mg  dose daily for the next 4 wks - POC INR recheck in 4 wks in [facility] -suspect altered by adjustment of dilantin he recently had  2. Intractable seizures ([place]) -has just had adjustment in dilantin dose by neurology due to elevated level -had keppra checked yesterday and his cmp drawn (not get seen)  3. Hyponatremia -f/u cmp pending to determine if sodium tablets could be reduced b/c he notes nausea with them -in feb, Na was 137 which was perfect with the 2 tabs   Labs/tests ordered:  INR in 4 wks Next appt: 11/17/2019  Briyanna Billingham L. Emmaly Leech, D.O. [facility] [address], [place] [address] Cell Phone (Mon-Fri 8am-5pm):  [phone] On Call:  [phone] & follow prompts after 5pm & weekends Office Phone:  [phone] Office Fax:  [phone]

## 2019-10-27 NOTE — Telephone Encounter (Signed)
Pt requested a CB to discuss the keppra and sodium levels states he believes results should be back.

## 2019-10-27 NOTE — Telephone Encounter (Signed)
Lab orders for keppra levels and cmp fax to [facility] at [phone] twice and confirmed.

## 2019-10-27 NOTE — Telephone Encounter (Signed)
If patient calls back please notified him I fax [facility] nurse  the lab orders were fax this morning at 1000am. Labs can take 24 to 72 hours to get the results.  This was left on his vm with the above message.

## 2019-10-28 DIAGNOSIS — G4089 Other seizures: Secondary | ICD-10-CM | POA: Diagnosis not present

## 2019-10-28 NOTE — Telephone Encounter (Signed)
I called the patient.  The patient has been on salt tablets for a number of years, not clear why.  Salt tablets can make him have an upset stomach, okay to switch over to increased salt intake with the regular diet.  He has not had a low sodium levels previously.  I will call him when I get the results of the blood work.

## 2019-11-08 ENCOUNTER — Telehealth: Payer: Self-pay | Admitting: Neurology

## 2019-11-08 MED ORDER — PHENYTOIN SODIUM EXTENDED 30 MG PO CAPS
90.0000 mg | ORAL_CAPSULE | Freq: Every morning | ORAL | 5 refills | Status: DC
Start: 2019-11-08 — End: 2020-01-31

## 2019-11-08 NOTE — Telephone Encounter (Signed)
Dilantin levels were 13.3, Keppra level 34.0.  Chemistry panel was unremarkable exception of an alkaline phosphatase is 166.  We will increase the Dilantin dosing to 90 mg in the morning and 300 mg in the evening.  We will recheck the Dilantin level in 3 weeks.

## 2019-11-08 NOTE — Telephone Encounter (Signed)
Order form with orders for medication and lab work fax to [facility] at [phone] twice and confirmed.

## 2019-11-08 NOTE — Telephone Encounter (Signed)
Rep with [facility] called to verify if lab order results were received CB#[phone]

## 2019-11-11 DIAGNOSIS — D649 Anemia, unspecified: Secondary | ICD-10-CM | POA: Diagnosis not present

## 2019-11-11 DIAGNOSIS — G40911 Epilepsy, unspecified, intractable, with status epilepticus: Secondary | ICD-10-CM | POA: Diagnosis not present

## 2019-11-11 LAB — BASIC METABOLIC PANEL
BUN: 12 (ref 4–21)
CO2: 21 (ref 13–22)
Chloride: 103 (ref 99–108)
Creatinine: 0.7 (ref 0.6–1.3)
Glucose: 96
Potassium: 4.6 (ref 3.4–5.3)
Sodium: 140 (ref 137–147)

## 2019-11-11 LAB — COMPREHENSIVE METABOLIC PANEL: Calcium: 8.9 (ref 8.7–10.7)

## 2019-11-11 NOTE — Telephone Encounter (Signed)
New keppra orders fax to [facility] at [phone] at [phone] and confirmed at both faxes.

## 2019-11-17 ENCOUNTER — Non-Acute Institutional Stay: Payer: Medicare Other | Admitting: Internal Medicine

## 2019-11-17 ENCOUNTER — Encounter: Payer: Self-pay | Admitting: Internal Medicine

## 2019-11-17 ENCOUNTER — Other Ambulatory Visit: Payer: Self-pay

## 2019-11-17 VITALS — BP 122/82 | HR 65 | Temp 97.8°F | Ht 68.0 in | Wt 175.2 lb

## 2019-11-17 DIAGNOSIS — I482 Chronic atrial fibrillation, unspecified: Secondary | ICD-10-CM | POA: Diagnosis not present

## 2019-11-17 DIAGNOSIS — D6869 Other thrombophilia: Secondary | ICD-10-CM

## 2019-11-17 DIAGNOSIS — I4891 Unspecified atrial fibrillation: Secondary | ICD-10-CM | POA: Diagnosis not present

## 2019-11-17 LAB — POCT INR: INR: 2.4 (ref 2.0–3.0)

## 2019-11-17 NOTE — Progress Notes (Signed)
Location:  Occupational psychologist of Service:  Clinic (12)  Provider: Libbey Duce L. Mariea Clonts, D.O., C.M.D.  Code Status: DNR Goals of Care:  Advanced Directives 11/17/2019  Does Patient Have a Medical Advance Directive? Yes  Type of Paramedic of [place];Out of facility DNR (pink MOST or yellow form);Living will  Does patient want to make changes to medical advance directive? No - Patient declined  Copy of [place] in Chart? Yes - validated most recent copy scanned in chart (See row information)  Would patient like information on creating a medical advance directive? -  Pre-existing out of facility DNR order (yellow form or pink MOST form) Pink MOST/Yellow Form most recent copy in chart - Physician notified to receive inpatient order   Chief Complaint  Patient presents with  . Medical Management of Chronic Issues    INR check     HPI: Patient is a 84 y.o. Potts seen today for INR check.  INR was 2.4 which is therapeutic for him.  I had adjusted his coumadin last time due to seizure med adjustments recently.   No increased bleeding or bruising beyond normal.    Past Medical History:  Diagnosis Date  . Abnormality of gait 12/21/2013  . Atrial fibrillation ([place])   . Blind left eye   . Carcinoma in situ of prostate   . Cardiac arrest ([place])   . Glaucoma   . Hypercholesteremia   . Hyperlipidemia   . Metabolic encephalopathy   . Mild left ventricular systolic dysfunction   . Presence of other specified functional implants   . PSA elevation   . Right frontal lobe lesion   . Seizures ([place])   . Tinea pedis   . 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 Urinary urgency     Past Surgical History:  Procedure Laterality Date  . CARDIAC DEFIBRILLATOR PLACEMENT    . cataract surgery    . COLONOSCOPY    . CRANIOTOMY Right    right anterior temporal resection  . defibrillator replaced  March 2016  . IMPLANTATION VAGAL NERVE  STIMULATOR  June 2016  . NASAL SINUS SURGERY    . TONSILLECTOMY     age 28  . VASECTOMY  1985    Allergies  Allergen Reactions  . Depakote [Divalproex Sodium] Other (See Comments)    Arthralgias     Outpatient Encounter Medications as of 11/17/2019  Medication Sig  . ALPRAZolam (XANAX) 0.25 MG tablet Take 1 tablet (0.25 mg total) by mouth 2 (two) times daily.  . brimonidine-timolol (COMBIGAN) 0.2-0.5 % ophthalmic solution Place 1 drop into both eyes every 12 (twelve) hours.   . cholecalciferol (VITAMIN D) 400 units TABS tablet Take 400 Units by mouth 2 (two) times daily.  . clobetasol cream (TEMOVATE) AB-123456789 % Apply 1 application topically 2 (two) times daily as needed (For Flares).  . finasteride (PROSCAR) 5 MG tablet Take 5 mg by mouth every morning.   . flecainide (TAMBOCOR) 50 MG tablet Take 50 mg by mouth 2 (two) times daily.  . fluocinonide ointment (LIDEX) AB-123456789 % Apply 1 application topically 2 (two) times daily as needed (on rash /skin folds, not for face).   Marland Kitchen ketoconazole (NIZORAL) 2 % shampoo Apply 1 application topically 2 (two) times a week. For seborrheic dermatitis on Monday and Thursday.  . Lacosamide (VIMPAT) 150 MG TABS Take 1 tablet (150 mg total) by mouth 2 (two) times daily.  Marland Kitchen latanoprost (XALATAN) 0.005 %  ophthalmic solution Place 1 drop into both eyes at bedtime.  . levETIRAcetam (KEPPRA XR PO) Take 750 mg by mouth at bedtime.  . Levetiracetam (KEPPRA XR) 750 MG TB24 Two tablets at 6 pm, 1 tablet at bedtime  . LORazepam (ATIVAN) 2 MG/ML concentrated solution Give 2 mg IM for seizure.If continued seizure activity after 15 minutes, give 2 mg again as needed.  Marland Kitchen LORazepam (LORAZEPAM INTENSOL) 2 MG/ML concentrated solution Take 0.5 mLs (1 mg total) by mouth daily as needed (postictal anxiety).  . phenytoin (DILANTIN) 100 MG ER capsule 3 capsules in the evening  . phenytoin (DILANTIN) 30 MG ER capsule Take 3 capsules (90 mg total) by mouth in the morning.  . polyethylene  glycol (MIRALAX / GLYCOLAX) packet Take 17 g by mouth See admin instructions. Once daily every other day and as needed for constipation  . polyethylene glycol powder (GLYCOLAX/MIRALAX) 17 GM/SCOOP powder Take by mouth.  . sodium chloride 1 g tablet Take 1 tablet (1 g total) by mouth 2 (two) times daily with a meal.  . tamsulosin (FLOMAX) 0.4 MG CAPS capsule Take 0.4 mg by mouth every morning.  . triamcinolone cream (KENALOG) 0.1 % Apply 1 application topically 2 (two) times daily as needed (for flares).   . warfarin (COUMADIN) 2.5 MG tablet Take 1 tablet (2.5 mg total) by mouth daily at 12 noon.  . Lidocaine (ASPERCREME LIDOCAINE) 4 % PTCH Apply 1 application topically in the morning and at bedtime. On in the am and off in the pm   No facility-administered encounter medications on file as of 11/17/2019.    Review of Systems:  Review of Systems  Constitutional: Negative for chills, fever and malaise/fatigue.  HENT: Negative for congestion.   Eyes: Positive for blurred vision.  Respiratory: Negative for cough and shortness of breath.   Cardiovascular: Negative for chest pain, palpitations and leg swelling.  Gastrointestinal: Negative for abdominal pain.  Genitourinary: Positive for frequency. Negative for dysuria and urgency.  Musculoskeletal: Positive for falls. Negative for back pain and joint pain.       Trigger finger  Neurological: Negative for dizziness and loss of consciousness.  Endo/Heme/Allergies: Bruises/bleeds easily.  Psychiatric/Behavioral: Positive for memory loss. Negative for depression. The patient is not nervous/anxious and does not have insomnia.     Health Maintenance  Topic Date Due  . INFLUENZA VACCINE  01/16/2020  . TETANUS/TDAP  12/06/2025  . COVID-19 Vaccine  Completed  . PNA vac Low Risk Adult  Completed    Physical Exam: Vitals:   11/17/19 0946  BP: 122/82  Pulse: 65  Temp: 97.8 F (36.6 C)  SpO2: 98%  Weight: 175 lb 2.6 oz (79.5 kg)  Height: 5\' 8"   (1.727 m)   Body mass index is 26.63 kg/m. Physical Exam Vitals reviewed.  Constitutional:      Appearance: Normal appearance.  Cardiovascular:     Rate and Rhythm: Rhythm irregular.  Pulmonary:     Effort: Pulmonary effort is normal.  Abdominal:     General: Bowel sounds are normal.  Musculoskeletal:     Comments: Using rollator walker  Skin:    General: Skin is warm and dry.  Neurological:     General: No focal deficit present.     Mental Status: He is alert. Mental status is at baseline.  Psychiatric:        Mood and Affect: Mood normal.        Behavior: Behavior normal.     Labs reviewed: Basic Metabolic  Panel: Recent Labs    01/29/19 0012 01/29/19 2218 02/01/19 0455 02/01/19 0455 02/02/19 0157 02/04/19 0000 02/16/19 0000 08/02/19 1128 11/11/19 0000  NA  --    < > 127*   < > 127*   < > 137 137 140  K  --    < > 4.1   < > 3.9   < > 4.6 5.1 4.6  CL  --    < > 98   < > 96*  --   --  102 103  CO2  --    < > 22   < > 23  --   --  17* 21  GLUCOSE  --    < > 98  --  92  --   --  84  --   BUN  --    < > 7*   < > 7*   < > 13 14 12   CREATININE  --    < > 0.63   < > 0.64   < > 0.6 0.81 0.7  CALCIUM  --    < > 8.4*   < > 8.3*  --   --  9.1 8.9  MG 1.9  --   --   --   --   --   --   --   --    < > = values in this interval not displayed.   Liver Function Tests: Recent Labs    11/24/18 0300 01/20/19 1141 08/02/19 1128  AST 26 25 31   ALT 24 19 16   ALKPHOS 125 145* 156*  BILITOT  --  0.3 <0.2  PROT  --  7.2 7.6  ALBUMIN  --  4.6 4.9*   No results for input(s): LIPASE, AMYLASE in the last 8760 hours. No results for input(s): AMMONIA in the last 8760 hours. CBC: Recent Labs    11/24/18 0000 01/20/19 1141 01/29/19 2218 01/30/19 0638 08/02/19 1128  WBC  --  4.8 9.4 6.3 5.7  NEUTROABS  --  2.2 6.1  --  2.7  HGB   < > 12.7* 11.6* 11.2* 13.9  HCT   < > 36.1* 32.7* 31.2* 38.9  MCV  --  91 90.8 89.9 92  PLT   < > 282 248 238 225   < > = values in this  interval not displayed.   Lipid Panel: No results for input(s): CHOL, HDL, LDLCALC, TRIG, CHOLHDL, LDLDIRECT in the last 8760 hours. No results found for: HGBA1C  Procedures since last visit: No results found.  Assessment/Plan 1. Hypercoagulability due to atrial fibrillation (HCC) -cont same dose of coumadin - POC INR in 4 weeks here at [facility]   2. Chronic atrial fibrillation (HCC) -rate controlled, cont same regimen including warfarin   Labs/tests ordered:  INR in 4 wks Next appt:  4 wk  Chyann Ambrocio L. Casi Westerfeld, D.O. [facility] [address], [place] [address] Cell Phone (Mon-Fri 8am-5pm):  [phone] On Call:  [phone] & follow prompts after 5pm & weekends Office Phone:  [phone] Office Fax:  [phone]

## 2019-11-17 NOTE — Progress Notes (Signed)
Continue same dose of coumadin and recheck INR in 4 wks at [facility] as scheduled.  Pt already notified.

## 2019-11-23 ENCOUNTER — Telehealth: Payer: Self-pay

## 2019-11-23 NOTE — Telephone Encounter (Signed)
Left voice mail for [facility] nurse line that Dr Jannifer Franklin receive the order form about getting pts dilantin level drawn earlier than 11/29/2019 because of a recent seizure and hallucinations that develop after he had a episode. VM was left per Dr. Jannifer Franklin he does not want labs drawn early and pt can get labs done 11/29/2019.

## 2019-11-23 NOTE — Telephone Encounter (Signed)
I called Cameron Potts at [facility] 336 [phone]. I stated vm was left that per Dr.WIllis he does not want labs for dilantin per the order form from [facility] who recommend it. I stated it will can be drawn on 11/29/2019. I stated also it stated became confusion and had some hallucinations after seizure. I stated some seizures can cause confusion but typically clears up. [name] verbalized understanding.

## 2019-11-29 DIAGNOSIS — R569 Unspecified convulsions: Secondary | ICD-10-CM | POA: Diagnosis not present

## 2019-11-29 DIAGNOSIS — G4089 Other seizures: Secondary | ICD-10-CM | POA: Diagnosis not present

## 2019-12-02 ENCOUNTER — Telehealth: Payer: Self-pay | Admitting: *Deleted

## 2019-12-02 NOTE — Telephone Encounter (Signed)
Received call from Lanare with [facility]. She stated the patient's Dilantin lab results were faxed over on 11/30/19 for Dr Jannifer Franklin to review.  I advised her Dr Jannifer Franklin is out of office until next Monday. I will have Katrina RN ask work in [place] to look at labs and call her back at [phone] with any instructions. Chantea  verbalized understanding, appreciation.

## 2019-12-06 NOTE — Telephone Encounter (Signed)
Dilantin level 16.1.  I would not change the Dilantin dosing, recheck in 4 weeks.  Orders have been sent.

## 2019-12-06 NOTE — Telephone Encounter (Signed)
Orders fax to [facility] at [phone] and signed by Dr. Jannifer Franklin to not make any changes to dilantin dosage and recheck levels in 4 weeks. Form fax twice and confirmed.

## 2019-12-22 ENCOUNTER — Non-Acute Institutional Stay: Payer: Medicare Other | Admitting: Internal Medicine

## 2019-12-22 ENCOUNTER — Encounter: Payer: Self-pay | Admitting: Internal Medicine

## 2019-12-22 ENCOUNTER — Other Ambulatory Visit: Payer: Self-pay

## 2019-12-22 VITALS — BP 128/76 | HR 60 | Temp 98.0°F

## 2019-12-22 DIAGNOSIS — D6869 Other thrombophilia: Secondary | ICD-10-CM

## 2019-12-22 DIAGNOSIS — I4891 Unspecified atrial fibrillation: Secondary | ICD-10-CM | POA: Diagnosis not present

## 2019-12-22 DIAGNOSIS — Z79899 Other long term (current) drug therapy: Secondary | ICD-10-CM | POA: Diagnosis not present

## 2019-12-22 LAB — POCT INR: INR: 3 (ref 2.0–3.0)

## 2019-12-22 NOTE — Patient Instructions (Addendum)
1.) Encourage resident to eat consistent amount of greens daily due to INR 3.0 today 2.) Continue Coumadin 2.5 mg daily  3.) Recheck PT/INR in 4 weeks on 01/19/2020 @ 2:30 pm

## 2019-12-22 NOTE — Progress Notes (Signed)
Location:   [facility]  Place of Service:   clinic  Provider: Madelein Mahadeo L. Mariea Clonts, D.O., C.M.D.  Code Status: DNR Goals of Care:  Advanced Directives 12/22/2019  Does Patient Have a Medical Advance Directive? Yes  Type of Advance Directive Living will;Out of facility DNR (pink MOST or yellow form)  Does patient want to make changes to medical advance directive? No - Patient declined  Copy of [place] in Chart? -  Would patient like information on creating a medical advance directive? -  Pre-existing out of facility DNR order (yellow form or pink MOST form) Yellow form placed in chart (order not valid for inpatient use);Pink MOST form placed in chart (order not valid for inpatient use)     Chief Complaint  Patient presents with  . Anticoagulation    4 week PT/INR check. Patient cut back on greens x 2 days in hopes of improving INR     HPI: Patient is a 84 y.o. Cameron Potts seen today for INR check.  He's on coumadin for afib with goal INR 2 to 3.  His current dose is 2.5mg  daily at noon.    He had cut back on greens for 2 days prior to his INR which was opposite of what he needed to do.  He'd been 2.4 last month and now he's at 3.0.    Past Medical History:  Diagnosis Date  . Abnormality of gait 12/21/2013  . Atrial fibrillation (Cameron Cameron Potts)   . Blind left eye   . Carcinoma in situ of prostate   . Cardiac arrest (Cameron Cameron Potts)   . Glaucoma   . Hypercholesteremia   . Hyperlipidemia   . Metabolic encephalopathy   . Mild left ventricular systolic dysfunction   . Presence of other specified functional implants   . PSA elevation   . Right frontal lobe lesion   . Seizures (Cameron Cameron Potts)   . Tinea pedis   . 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 Urinary urgency     Past Surgical History:  Procedure Laterality Date  . CARDIAC DEFIBRILLATOR PLACEMENT    . cataract surgery    . COLONOSCOPY    . CRANIOTOMY Right    right anterior temporal resection  . defibrillator replaced  March  2016  . IMPLANTATION VAGAL NERVE STIMULATOR  June 2016  . NASAL SINUS SURGERY    . TONSILLECTOMY     age 33  . VASECTOMY  1985    Allergies  Allergen Reactions  . Depakote [Divalproex Sodium] Other (See Comments)    Arthralgias     Outpatient Encounter Medications as of 12/22/2019  Medication Sig  . ALPRAZolam (XANAX) 0.25 MG tablet Take 1 tablet (0.25 mg total) by mouth 2 (two) times daily.  . brimonidine-timolol (COMBIGAN) 0.2-0.5 % ophthalmic solution Place 1 drop into both eyes every 12 (twelve) hours.   . cholecalciferol (VITAMIN D) 400 units TABS tablet Take 400 Units by mouth 2 (two) times daily.  . clobetasol cream (TEMOVATE) 1.61 % Apply 1 application topically 2 (two) times daily as needed (For Flares).  . finasteride (PROSCAR) 5 MG tablet Take 5 mg by mouth every morning.   . flecainide (TAMBOCOR) 50 MG tablet Take 50 mg by mouth 2 (two) times daily.  . fluocinonide ointment (LIDEX) 0.96 % Apply 1 application topically 2 (two) times daily as needed (on rash /skin folds, not for face).   Cameron Cameron Potts ketoconazole (NIZORAL) 2 % shampoo Apply 1 application topically 2 (two) times a week. For seborrheic dermatitis  on Monday and Thursday.  . Lacosamide (VIMPAT) 150 MG TABS Take 1 tablet (150 mg total) by mouth 2 (two) times daily.  Cameron Cameron Potts latanoprost (XALATAN) 0.005 % ophthalmic solution Place 1 drop into both eyes at bedtime.  . levETIRAcetam (KEPPRA XR PO) Take 750 mg by mouth at bedtime.  . Levetiracetam (KEPPRA XR) 750 MG TB24 Two tablets at 6 pm, 1 tablet at bedtime  . LORazepam (ATIVAN) 2 MG/ML concentrated solution Give 2 mg IM for seizure.If continued seizure activity after 15 minutes, give 2 mg again as needed.  Cameron Cameron Potts LORazepam (LORAZEPAM INTENSOL) 2 MG/ML concentrated solution Take 0.5 mLs (1 mg total) by mouth daily as needed (postictal anxiety).  . phenytoin (DILANTIN) 100 MG ER capsule 3 capsules in the evening  . phenytoin (DILANTIN) 30 MG ER capsule Take 3 capsules (90 mg total) by  mouth in the morning.  . polyethylene glycol (MIRALAX / GLYCOLAX) packet Take 17 g by mouth See admin instructions. Once daily every other day and as needed for constipation  . polyethylene glycol powder (GLYCOLAX/MIRALAX) 17 GM/SCOOP powder Take by mouth.  . sodium chloride 1 g tablet Take 1 tablet (1 g total) by mouth 2 (two) times daily with a meal.  . tamsulosin (FLOMAX) 0.4 MG CAPS capsule Take 0.4 mg by mouth every morning.  . triamcinolone cream (KENALOG) 0.1 % Apply 1 application topically 2 (two) times daily as needed (for flares).   . warfarin (COUMADIN) 2.5 MG tablet Take 1 tablet (2.5 mg total) by mouth daily at 12 noon.  . [DISCONTINUED] Lidocaine (ASPERCREME LIDOCAINE) 4 % PTCH Apply 1 application topically in the morning and at bedtime. On in the am and off in the pm   No facility-administered encounter medications on file as of 12/22/2019.    Review of Systems:  Review of Systems  Constitutional: Negative for chills and fever.  Musculoskeletal: Positive for falls. Negative for back pain.  Neurological: Negative for seizures.       No recent seizures  Endo/Heme/Allergies: Bruises/bleeds easily.  Psychiatric/Behavioral: Positive for memory loss. Negative for depression.    Health Maintenance  Topic Date Due  . INFLUENZA VACCINE  01/16/2020  . TETANUS/TDAP  12/06/2025  . COVID-19 Vaccine  Completed  . PNA vac Low Risk Adult  Completed    Physical Exam: Vitals:   12/22/19 1503  BP: 128/76  Pulse: 60  Temp: 98 F (36.7 C)  TempSrc: Temporal  SpO2: 98%   There is no height or weight on file to calculate BMI. Physical Exam Constitutional:      General: He is not in acute distress. Cardiovascular:     Rate and Rhythm: Rhythm irregular.  Pulmonary:     Effort: Pulmonary effort is normal.  Musculoskeletal:     Comments: Ambulates with rollator walker  Neurological:     General: No focal deficit present.     Mental Status: He is alert.    Labs  reviewed: Basic Metabolic Panel: Recent Labs    01/29/19 0012 01/29/19 2218 02/01/19 0455 02/01/19 0455 02/02/19 0157 02/04/19 0000 02/16/19 0000 08/02/19 1128 11/11/19 0000  NA  --    < > 127*   < > 127*   < > 137 137 140  K  --    < > 4.1   < > 3.9   < > 4.6 5.1 4.6  CL  --    < > 98   < > 96*  --   --  102 103  CO2  --    < >  22   < > 23  --   --  17* 21  GLUCOSE  --    < > 98  --  92  --   --  Cameron  --   BUN  --    < > 7*   < > 7*   < > 13 14 12   CREATININE  --    < > 0.63   < > 0.64   < > 0.6 0.81 0.7  CALCIUM  --    < > 8.4*   < > 8.3*  --   --  9.1 8.9  MG 1.9  --   --   --   --   --   --   --   --    < > = values in this interval not displayed.   Liver Function Tests: Recent Labs    01/20/19 1141 08/02/19 1128  AST 25 31  ALT 19 16  ALKPHOS 145* 156*  BILITOT 0.3 <0.2  PROT 7.2 7.6  ALBUMIN 4.6 4.9*   No results for input(s): LIPASE, AMYLASE in the last 8760 hours. No results for input(s): AMMONIA in the last 8760 hours. CBC: Recent Labs    01/20/19 1141 01/29/19 2218 01/30/19 0638 08/02/19 1128  WBC 4.8 9.4 6.3 5.7  NEUTROABS 2.2 6.1  --  2.7  HGB 12.7* 11.6* 11.2* 13.9  HCT 36.1* 32.7* 31.2* 38.9  MCV 91 90.8 89.9 92  PLT 282 248 238 225   Lipid Panel: No results for input(s): CHOL, HDL, LDLCALC, TRIG, CHOLHDL, LDLDIRECT in the last 8760 hours. No results found for: HGBA1C  Procedures since last visit: No results found.  Assessment/Plan 1. Hypercoagulability due to atrial fibrillation (HCC) - POC INR was 3 today - goal is 2-3 -pt had eaten less greens past few days -will ask him to stay consistent with greens intake  -recheck INR in 4 wks  2. Encounter for long-term current use of medication - POC INR -keep on coumadin 2.5mg  po daily and recheck in 4 wks  Labs/tests ordered:   Lab Orders     POC INR   Next appt:  4 wks in [place]. Guinevere Stephenson, D.O. [facility] [address], [place] [address] Cell Phone (Mon-Fri 8am-5pm):  [phone] On Call:  [phone] & follow prompts after 5pm & weekends Office Phone:  [phone] Office Fax:  [phone]

## 2019-12-28 DIAGNOSIS — H401133 Primary open-angle glaucoma, bilateral, severe stage: Secondary | ICD-10-CM | POA: Diagnosis not present

## 2019-12-28 DIAGNOSIS — Z961 Presence of intraocular lens: Secondary | ICD-10-CM | POA: Diagnosis not present

## 2019-12-29 ENCOUNTER — Telehealth: Payer: Self-pay

## 2019-12-29 DIAGNOSIS — Z5181 Encounter for therapeutic drug level monitoring: Secondary | ICD-10-CM

## 2019-12-29 NOTE — Telephone Encounter (Signed)
Pt called and is requesting sodium level to be added to his dilantin check coming up next week. Pt is due for labs on 01/05/2020.  Dr. Jannifer Franklin is out of the office this week but will return on 01/03/20 will fwd for him to review.

## 2019-12-29 NOTE — Telephone Encounter (Signed)
Cameron Potts is a 84 y.o. male requesting an order for sodium and dilantin level check for his home visits.

## 2020-01-03 ENCOUNTER — Encounter: Payer: Self-pay | Admitting: Internal Medicine

## 2020-01-03 DIAGNOSIS — R569 Unspecified convulsions: Secondary | ICD-10-CM | POA: Diagnosis not present

## 2020-01-03 DIAGNOSIS — E785 Hyperlipidemia, unspecified: Secondary | ICD-10-CM | POA: Diagnosis not present

## 2020-01-03 DIAGNOSIS — G4089 Other seizures: Secondary | ICD-10-CM | POA: Diagnosis not present

## 2020-01-03 DIAGNOSIS — D649 Anemia, unspecified: Secondary | ICD-10-CM | POA: Diagnosis not present

## 2020-01-03 DIAGNOSIS — Z79899 Other long term (current) drug therapy: Secondary | ICD-10-CM | POA: Diagnosis not present

## 2020-01-03 LAB — BASIC METABOLIC PANEL
BUN: 13 (ref 4–21)
CO2: 25 — AB (ref 13–22)
Chloride: 108 (ref 99–108)
Creatinine: 0.8 (ref 0.6–1.3)
Glucose: 102
Potassium: 4.8 (ref 3.4–5.3)
Sodium: 138 (ref 137–147)

## 2020-01-03 LAB — CBC AND DIFFERENTIAL
HCT: 36 — AB (ref 41–53)
Hemoglobin: 12.6 — AB (ref 13.5–17.5)
Platelets: 205 (ref 150–399)
WBC: 4.8

## 2020-01-03 LAB — HEPATIC FUNCTION PANEL
ALT: 12 (ref 10–40)
AST: 16 (ref 14–40)
Alkaline Phosphatase: 120 (ref 25–125)
Bilirubin, Direct: 0.2 (ref 0.01–0.4)
Bilirubin, Total: 0.2

## 2020-01-03 LAB — LIPID PANEL
Cholesterol: 212 — AB (ref 0–200)
HDL: 53 (ref 35–70)
LDL Cholesterol: 143
Triglycerides: 79 (ref 40–160)

## 2020-01-03 LAB — COMPREHENSIVE METABOLIC PANEL
Albumin: 3.9 (ref 3.5–5.0)
Calcium: 8.6 — AB (ref 8.7–10.7)
GFR calc Af Amer: >90
GFR calc non Af Amer: 85.35

## 2020-01-03 LAB — CBC: RBC: 3.81 — AB (ref 3.87–5.11)

## 2020-01-03 NOTE — Telephone Encounter (Signed)
I will place the orders for the Dilantin level, faxed to [facility] at [phone].

## 2020-01-03 NOTE — Addendum Note (Signed)
Addended by: Kathrynn Ducking on: 01/03/2020 07:10 AM   Modules accepted: Orders

## 2020-01-04 ENCOUNTER — Telehealth: Payer: Self-pay | Admitting: Neurology

## 2020-01-04 NOTE — Telephone Encounter (Signed)
Most recent Dilantin level was 20.6.  No alteration in dosing is recommended at this time.

## 2020-01-06 ENCOUNTER — Encounter: Payer: Self-pay | Admitting: Internal Medicine

## 2020-01-10 ENCOUNTER — Other Ambulatory Visit: Payer: Self-pay | Admitting: Adult Health

## 2020-01-10 MED ORDER — VIMPAT 150 MG PO TABS: 150.0000 mg | ORAL_TABLET | Freq: Two times a day (BID) | ORAL | 3 refills | Status: DC

## 2020-01-19 ENCOUNTER — Encounter: Payer: Self-pay | Admitting: Internal Medicine

## 2020-01-19 ENCOUNTER — Other Ambulatory Visit: Payer: Self-pay

## 2020-01-19 ENCOUNTER — Non-Acute Institutional Stay: Payer: Medicare Other | Admitting: Internal Medicine

## 2020-01-19 VITALS — BP 128/78 | HR 68 | Temp 96.8°F | Ht 68.0 in | Wt 175.0 lb

## 2020-01-19 DIAGNOSIS — I4891 Unspecified atrial fibrillation: Secondary | ICD-10-CM | POA: Diagnosis not present

## 2020-01-19 DIAGNOSIS — D6869 Other thrombophilia: Secondary | ICD-10-CM

## 2020-01-19 LAB — POCT INR: INR: 2.2 (ref 2.0–3.0)

## 2020-01-19 LAB — PHENYTOIN LEVEL, FREE: Dilantin: 20.6

## 2020-01-19 NOTE — Progress Notes (Addendum)
Location:  [facility]up[facility] psychologist of Service:  Clinic (12)  Provider: Hulen Mandler L. Mariea Clonts, D.O., C.M.D.  Code Status: DNR, MOST Goals of Care:  Advanced Directives 12/22/2019  Does Patient Have a Medical Advance Directive? Yes  Type of Advance Directive Living will;Out of facility DNR (pink MOST or yellow form)  Does patient want to make changes to medical advance directive? No - Patient declined  Copy of [place] in Chart? -  Would patient like information on creating a medical advance directive? -  Pre-existing out of facility DNR order (yellow form or pink MOST form) Yellow form placed in chart (order not valid for inpatient use);Pink MOST form placed in chart (order not valid for inpatient use)     Chief Complaint  Patient presents with  . Medical Management of Chronic Issues    INR Check    HPI: Patient is a 84 y.o. male seen today for INR check.  He's on warfarin for hypercoagulable state due to afib.  He's had no episodes of major bleeding.  His intake of greens has not changed.  He's doing fine.  He wonders why he has to come so often for coumadin checks.    Past Medical History:  Diagnosis Date  . Abnormality of gait 12/21/2013  . Atrial fibrillation ([facility])   . Blind left eye   . Carcinoma in situ of prostate   . Cardiac arrest ([place])   . Glaucoma   . Hypercholesteremia   . Hyperlipidemia   . Metabolic encephalopathy   . Mild left ventricular systolic dysfunction   . Presence of other specified functional implants   . PSA elevation   . Right frontal lobe lesion   . Seizures ([place])   . Tinea pedis   . 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 Urinary urgency     Past Surgical History:  Procedure Laterality Date  . CARDIAC DEFIBRILLATOR PLACEMENT    . cataract surgery    . COLONOSCOPY    . CRANIOTOMY Right    right anterior temporal resection  . defibrillator replaced  March 2016  . IMPLANTATION VAGAL NERVE STIMULATOR   June 2016  . NASAL SINUS SURGERY    . TONSILLECTOMY     age 76  . VASECTOMY  1985    Allergies  Allergen Reactions  . Depakote [Divalproex Sodium] Other (See Comments)    Arthralgias     Outpatient Encounter Medications as of 01/19/2020  Medication Sig  . ALPRAZolam (XANAX) 0.25 MG tablet Take 1 tablet (0.25 mg total) by mouth 2 (two) times daily.  . brimonidine-timolol (COMBIGAN) 0.2-0.5 % ophthalmic solution Place 1 drop into both eyes every 12 (twelve) hours.   . cholecalciferol (VITAMIN D) 400 units TABS tablet Take 400 Units by mouth 2 (two) times daily.  . clobetasol cream (TEMOVATE) 5.46 % Apply 1 application topically 2 (two) times daily as needed (For Flares).  . finasteride (PROSCAR) 5 MG tablet Take 5 mg by mouth every morning.   . flecainide (TAMBOCOR) 50 MG tablet Take 50 mg by mouth 2 (two) times daily.  . fluocinonide ointment (LIDEX) 5.03 % Apply 1 application topically 2 (two) times daily as needed (on rash /skin folds, not for face).   Marland Kitchen ketoconazole (NIZORAL) 2 % shampoo Apply 1 application topically 2 (two) times a week. For seborrheic dermatitis on Monday and Thursday.  . Lacosamide (VIMPAT) 150 MG TABS Take 1 tablet (150 mg total) by mouth 2 (two) times daily.  Marland Kitchen  latanoprost (XALATAN) 0.005 % ophthalmic solution Place 1 drop into both eyes at bedtime.  . levETIRAcetam (KEPPRA XR PO) Take 750 mg by mouth at bedtime.  . Levetiracetam (KEPPRA XR) 750 MG TB24 Two tablets at 6 pm, 1 tablet at bedtime  . LORazepam (ATIVAN) 2 MG/ML concentrated solution Give 2 mg IM for seizure.If continued seizure activity after 15 minutes, give 2 mg again as needed.  Marland Kitchen LORazepam (LORAZEPAM INTENSOL) 2 MG/ML concentrated solution Take 0.5 mLs (1 mg total) by mouth daily as needed (postictal anxiety).  . phenytoin (DILANTIN) 100 MG ER capsule 3 capsules in the evening  . phenytoin (DILANTIN) 30 MG ER capsule Take 3 capsules (90 mg total) by mouth in the morning.  . polyethylene glycol  (MIRALAX / GLYCOLAX) packet Take 17 g by mouth See admin instructions. Once daily every other day and as needed for constipation  . polyethylene glycol powder (GLYCOLAX/MIRALAX) 17 GM/SCOOP powder Take by mouth.  . sodium chloride 1 g tablet Take 1 tablet (1 g total) by mouth 2 (two) times daily with a meal.  . tamsulosin (FLOMAX) 0.4 MG CAPS capsule Take 0.4 mg by mouth every morning.  . triamcinolone cream (KENALOG) 0.1 % Apply 1 application topically 2 (two) times daily as needed (for flares).   . warfarin (COUMADIN) 2.5 MG tablet Take 1 tablet (2.5 mg total) by mouth daily at 12 noon.   No facility-administered encounter medications on file as of 01/19/2020.    Review of Systems:  Review of Systems  Constitutional: Negative for chills, fever and malaise/fatigue.  Respiratory: Negative for shortness of breath.   Cardiovascular: Negative for chest pain, palpitations and leg swelling.  Gastrointestinal: Negative for abdominal pain.  Genitourinary: Negative for dysuria.  Musculoskeletal: Negative for falls.  Neurological: Negative for dizziness and loss of consciousness.  Endo/Heme/Allergies: Bruises/bleeds easily.  Psychiatric/Behavioral: Positive for memory loss.    Health Maintenance  Topic Date Due  . INFLUENZA VACCINE  01/16/2020  . TETANUS/TDAP  12/06/2025  . COVID-19 Vaccine  Completed  . PNA vac Low Risk Adult  Completed    Physical Exam: Vitals:   01/19/20 1413  BP: 128/78  Pulse: 68  Temp: (!) 96.8 F (36 C)  TempSrc: Temporal  SpO2: 92%  Weight: 175 lb (79.4 kg)  Height: 5\' 8"  (1.727 m)   Body mass index is 26.61 kg/m. Physical Exam Vitals reviewed.  Constitutional:      Appearance: Normal appearance.  Cardiovascular:     Rate and Rhythm: Rhythm irregular.  Pulmonary:     Effort: Pulmonary effort is normal.  Musculoskeletal:        General: Normal range of motion.     Right lower leg: No edema.     Left lower leg: No edema.     Comments: Ambulates  with rollator walker  Neurological:     General: No focal deficit present.     Mental Status: He is alert and oriented to person, place, and time.  Psychiatric:        Mood and Affect: Mood normal.     Labs reviewed: Basic Metabolic Panel: Recent Labs    01/29/19 0012 01/29/19 2218 02/01/19 0455 02/01/19 0455 02/02/19 0157 02/04/19 0000 02/16/19 0000 08/02/19 1128 11/11/19 0000  NA  --    < > 127*   < > 127*   < > 137 137 140  K  --    < > 4.1   < > 3.9   < > 4.6 5.1 4.6  CL  --    < > 98   < > 96*  --   --  102 103  CO2  --    < > 22   < > 23  --   --  17* 21  GLUCOSE  --    < > 98  --  92  --   --  84  --   BUN  --    < > 7*   < > 7*   < > 13 14 12   CREATININE  --    < > 0.63   < > 0.64   < > 0.6 0.81 0.7  CALCIUM  --    < > 8.4*   < > 8.3*  --   --  9.1 8.9  MG 1.9  --   --   --   --   --   --   --   --    < > = values in this interval not displayed.   Liver Function Tests: Recent Labs    01/20/19 1141 08/02/19 1128  AST 25 31  ALT 19 16  ALKPHOS 145* 156*  BILITOT 0.3 <0.2  PROT 7.2 7.6  ALBUMIN 4.6 4.9*   No results for input(s): LIPASE, AMYLASE in the last 8760 hours. No results for input(s): AMMONIA in the last 8760 hours. CBC: Recent Labs    01/20/19 1141 01/29/19 2218 01/30/19 0638 08/02/19 1128  WBC 4.8 9.4 6.3 5.7  NEUTROABS 2.2 6.1  --  2.7  HGB 12.7* 11.6* 11.2* 13.9  HCT 36.1* 32.7* 31.2* 38.9  MCV 91 90.8 89.9 92  PLT 282 248 238 225   Lipid Panel: No results for input(s): CHOL, HDL, LDLCALC, TRIG, CHOLHDL, LDLDIRECT in the last 8760 hours. No results found for: HGBA1C   Assessment/Plan 1. Hypercoagulability due to atrial fibrillation (HCC) Continue current dose of coumadin 2.5mg  daily and recheck INR in 4 weeks in [facility]. - POC INR  Labs/tests ordered:   Lab Orders     POC INR  Next appt:  4 wks in [facility]  Bennet Kujawa L. Mikell Kazlauskas, D.O. [facility] [address], [place]  [address] Cell Phone (Mon-Fri 8am-5pm):  [phone] On Call:  [phone] & follow prompts after 5pm & weekends Office Phone:  [phone] Office Fax:  [phone]

## 2020-01-27 ENCOUNTER — Telehealth: Payer: Self-pay | Admitting: Neurology

## 2020-01-27 ENCOUNTER — Encounter: Payer: Self-pay | Admitting: Internal Medicine

## 2020-01-27 ENCOUNTER — Telehealth: Payer: Self-pay

## 2020-01-27 DIAGNOSIS — Z5181 Encounter for therapeutic drug level monitoring: Secondary | ICD-10-CM

## 2020-01-27 DIAGNOSIS — G40911 Epilepsy, unspecified, intractable, with status epilepticus: Secondary | ICD-10-CM | POA: Diagnosis not present

## 2020-01-27 DIAGNOSIS — R569 Unspecified convulsions: Secondary | ICD-10-CM | POA: Diagnosis not present

## 2020-01-27 NOTE — Telephone Encounter (Signed)
Called the [facility]-[phone] to add the BMET to lab from this morning. Cameron Potts has asked order req be sent to them and provided fax number [phone].

## 2020-01-27 NOTE — Addendum Note (Signed)
Addended by: Kathrynn Ducking on: 01/27/2020 04:20 PM   Modules accepted: Orders

## 2020-01-27 NOTE — Telephone Encounter (Signed)
Per Dr.Reed, [name] needs to discuss results with patients Neurologist Dr.Willis.  I called patient and she verbalized understanding and stated she faxed a copy of results to [facility] as well

## 2020-01-27 NOTE — Telephone Encounter (Signed)
Pt states his blood was drawn this morning but it was not for his sodium level.  Pt is asking that his Sodium level please be checked as well.

## 2020-01-27 NOTE — Telephone Encounter (Signed)
AL Nurse at [facility] called to inform Dr.Reed of patients Dilantin level in which she will fax as well  Current value 22, reference range 10-20

## 2020-01-27 NOTE — Telephone Encounter (Signed)
Okay to check electrolytes, I will put in an order.

## 2020-01-31 ENCOUNTER — Telehealth: Payer: Self-pay | Admitting: Neurology

## 2020-01-31 ENCOUNTER — Other Ambulatory Visit: Payer: Self-pay

## 2020-01-31 ENCOUNTER — Emergency Department (HOSPITAL_COMMUNITY)
Admission: EM | Admit: 2020-01-31 | Discharge: 2020-01-31 | Disposition: A | Discharge status: Home / Self Care | Payer: Medicare Other | Attending: Emergency Medicine | Admitting: Emergency Medicine

## 2020-01-31 ENCOUNTER — Encounter (HOSPITAL_COMMUNITY): Payer: Self-pay | Admitting: Emergency Medicine

## 2020-01-31 ENCOUNTER — Emergency Department (HOSPITAL_COMMUNITY): Payer: Medicare Other

## 2020-01-31 DIAGNOSIS — I6389 Other cerebral infarction: Secondary | ICD-10-CM | POA: Diagnosis not present

## 2020-01-31 DIAGNOSIS — I10 Essential (primary) hypertension: Secondary | ICD-10-CM | POA: Diagnosis not present

## 2020-01-31 DIAGNOSIS — G9389 Other specified disorders of brain: Secondary | ICD-10-CM | POA: Diagnosis not present

## 2020-01-31 DIAGNOSIS — H748X2 Other specified disorders of left middle ear and mastoid: Secondary | ICD-10-CM | POA: Diagnosis not present

## 2020-01-31 DIAGNOSIS — Y92008 Other place in unspecified non-institutional (private) residence as the place of occurrence of the external cause: Secondary | ICD-10-CM | POA: Insufficient documentation

## 2020-01-31 DIAGNOSIS — Y999 Unspecified external cause status: Secondary | ICD-10-CM | POA: Diagnosis not present

## 2020-01-31 DIAGNOSIS — S0001XA Abrasion of scalp, initial encounter: Secondary | ICD-10-CM | POA: Insufficient documentation

## 2020-01-31 DIAGNOSIS — Z7401 Bed confinement status: Secondary | ICD-10-CM | POA: Diagnosis not present

## 2020-01-31 DIAGNOSIS — R55 Syncope and collapse: Secondary | ICD-10-CM | POA: Diagnosis not present

## 2020-01-31 DIAGNOSIS — S0990XA Unspecified injury of head, initial encounter: Secondary | ICD-10-CM | POA: Diagnosis not present

## 2020-01-31 DIAGNOSIS — R58 Hemorrhage, not elsewhere classified: Secondary | ICD-10-CM | POA: Diagnosis not present

## 2020-01-31 DIAGNOSIS — W1839XA Other fall on same level, initial encounter: Secondary | ICD-10-CM | POA: Insufficient documentation

## 2020-01-31 DIAGNOSIS — E871 Hypo-osmolality and hyponatremia: Secondary | ICD-10-CM | POA: Diagnosis not present

## 2020-01-31 DIAGNOSIS — Y939 Activity, unspecified: Secondary | ICD-10-CM | POA: Insufficient documentation

## 2020-01-31 DIAGNOSIS — Z043 Encounter for examination and observation following other accident: Secondary | ICD-10-CM | POA: Diagnosis not present

## 2020-01-31 DIAGNOSIS — M2550 Pain in unspecified joint: Secondary | ICD-10-CM | POA: Diagnosis not present

## 2020-01-31 DIAGNOSIS — W19XXXA Unspecified fall, initial encounter: Secondary | ICD-10-CM | POA: Diagnosis not present

## 2020-01-31 LAB — COMPREHENSIVE METABOLIC PANEL: Calcium: 10.4 (ref 8.7–10.7)

## 2020-01-31 LAB — BASIC METABOLIC PANEL
BUN: 32 — AB (ref 4–21)
CO2: 30 — AB (ref 13–22)
Chloride: 96 — AB (ref 99–108)
Creatinine: 1.4 — AB (ref 0.6–1.3)
Glucose: 88
Potassium: 4.6 (ref 3.4–5.3)
Sodium: 139 (ref 137–147)

## 2020-01-31 MED ORDER — PHENYTOIN 50 MG PO CHEW
75.0000 mg | CHEWABLE_TABLET | Freq: Every day | ORAL | 1 refills | Status: DC
Start: 2020-01-31 — End: 2020-02-15

## 2020-01-31 MED ORDER — PHENYTOIN 50 MG PO CHEW
75.0000 mg | CHEWABLE_TABLET | Freq: Every day | ORAL | 1 refills | Status: DC
Start: 2020-01-31 — End: 2020-01-31

## 2020-01-31 NOTE — Discharge Instructions (Addendum)
The scan of your head did not show any significant acute traumatic injuries. You can continue to take coumadin as prescribed. The wound to the top of your head is an abrasion and doesn't require sutures/staples. You can clean it with mild soap and water.

## 2020-01-31 NOTE — ED Notes (Signed)
Pt discharge instructions reviewed with the patient. The patient verbalized understanding. Pt discharged. 

## 2020-01-31 NOTE — ED Triage Notes (Signed)
Pt BIB [facility] from [facility]. Pt had unwitnessed fall from ground level. Pt has been diagnosed with orthostatic hypotension and believes that's what caused the fall. Pt A&Ox4 upon EMS arrival. Pt does take Coumadin. VSS. NAD.

## 2020-01-31 NOTE — ED Notes (Signed)
Patient transported to CT 

## 2020-01-31 NOTE — ED Provider Notes (Signed)
[facility] EMERGENCY DEPARTMENT Provider Note   CSN: [id number] Arrival date & time: 01/31/20  1108     History No chief complaint on file.   Cameron Potts is a 84 y.o. male.  HPI   65yM s/p fall. Was getting up from the toilet when he fell. No LOC. Has hx of orthostatic hypotension and thinks this may have been what caused it although he doesn't remember feeling dizzy/lightheaded. Did strike his head. On coumadin. Minimal headache. Denies acute pain elsewhere.   Past Medical History:  Diagnosis Date  . Seizures ([place])    There are no problems to display for this patient.  History reviewed. No pertinent surgical history.    History reviewed. No pertinent family history.  Social History   Tobacco Use  . Smoking status: Never Smoker  . Smokeless tobacco: Never Used  Substance Use Topics  . Alcohol use: Not Currently  . Drug use: Not on file    Home Medications Prior to Admission medications   Not on File    Allergies    Patient has no known allergies.  Review of Systems   Review of Systems All systems reviewed and negative, other than as noted in HPI.  Physical Exam Updated Vital Signs BP (!) 172/60 (BP Location: Right Arm)   Pulse 64   Temp 97.7 F (36.5 C) (Oral)   Resp 16   Ht 5\' 8"  (1.727 m)   Wt 79.4 kg   SpO2 100%   BMI 26.61 kg/m   Physical Exam Vitals and nursing note reviewed.  Constitutional:      General: He is not in acute distress.    Appearance: He is well-developed.  HENT:     Head: Normocephalic.     Comments: Abrasion posterior scalp. No midline spinal tenderness.  Eyes:     General:        Right eye: No discharge.        Left eye: No discharge.     Conjunctiva/sclera: Conjunctivae normal.  Cardiovascular:     Rate and Rhythm: Normal rate and regular rhythm.     Heart sounds: Normal heart sounds. No murmur heard.  No friction rub. No gallop.   Pulmonary:     Effort: Pulmonary effort is normal.  No respiratory distress.     Breath sounds: Normal breath sounds.  Abdominal:     General: There is no distension.     Palpations: Abdomen is soft.     Tenderness: There is no abdominal tenderness.  Musculoskeletal:        General: No tenderness.     Cervical back: Neck supple.  Skin:    General: Skin is warm and dry.  Neurological:     Mental Status: He is alert.  Psychiatric:        Behavior: Behavior normal.        Thought Content: Thought content normal.     ED Results / Procedures / Treatments   Labs (all labs ordered are listed, but only abnormal results are displayed) Labs Reviewed - No data to display  EKG None  Radiology CT Head Wo Contrast  Result Date: 01/31/2020 CLINICAL DATA:  Pain following fall EXAM: CT HEAD WITHOUT CONTRAST TECHNIQUE: Contiguous axial images were obtained from the base of the skull through the vertex without intravenous contrast. COMPARISON:  None. FINDINGS: Brain: There is mild diffuse atrophy. There is no intracranial mass, hemorrhage, extra-axial fluid collection, or midline shift. There is evidence of encephalomalacia  throughout much of the right temporal lobe region. There is an age uncertain but potentially recent infarct in the superior right centrum semiovale extending to the superior right posterior frontal gray-white junction. Elsewhere there is slight small vessel disease in the centra semiovale bilaterally. Vascular: No hyperdense vessel. Calcification noted in each carotid siphon and distal vertebral artery region. Skull: Patient has had a previous right temporal craniotomy. Bony calvarium otherwise intact. Sinuses/Orbits: There is mucosal thickening in the maxillary antra bilaterally. A lesser degree of mucosal thickening noted in the sphenoid sinuses. There is opacification in multiple ethmoid air cells. Orbits appear symmetric bilaterally. Other: Mastoid air cells on the right are clear. There is opacification of several inferior mastoids  on the left. IMPRESSION: 1. Age uncertain but potentially recent infarct in the superior right centrum semiovale extending to the gray-white junction in the posteromedial right frontal lobe. 2. Previous right temporal craniotomy with extensive right temporal lobe encephalomalacia. 3. Mild underlying atrophy. Mild periventricular small vessel disease. No mass or hemorrhage. 4.  Multifocal paranasal sinus disease. 5. Opacification of several mastoid air cells inferiorly on the left. Electronically Signed   By: Lowella Grip Potts M.D.   On: 01/31/2020 11:46    Procedures Procedures (including critical care time)  Medications Ordered in ED Medications - No data to display  ED Course  I have reviewed the triage vital signs and the nursing notes.  Pertinent labs & imaging results that were available during my care of the patient were reviewed by me and considered in my medical decision making (see chart for details).    MDM Rules/Calculators/A&P                          84yM s/p fall on coumadin. Imaging reassuring. No acute neuro complaints.  Wound care.  Head injury instructions discussed.  Outpatient follow-up as needed.  Final Clinical Impression(s) / ED Diagnoses Final diagnoses:  Injury of head, initial encounter  Abrasion of scalp, initial encounter    Rx / DC Orders ED Discharge Orders    None       Virgel Manifold, MD 01/31/20 1343

## 2020-01-31 NOTE — Telephone Encounter (Signed)
The Dilantin level has drifted up to 22.0.  I will alter the Dilantin dosing to 75 mg in the morning and 300 mg in the evening.  The patient will be taken off the 30 mg capsules of Dilantin and switched to the 50 mg tablet taking 1.5 tablets in the morning.  The prescription [facility] at [phone].  We will recheck Dilantin level in 3 weeks.

## 2020-02-01 ENCOUNTER — Encounter: Payer: Self-pay | Admitting: Internal Medicine

## 2020-02-07 DIAGNOSIS — Z9189 Other specified personal risk factors, not elsewhere classified: Secondary | ICD-10-CM | POA: Diagnosis not present

## 2020-02-07 DIAGNOSIS — Z20828 Contact with and (suspected) exposure to other viral communicable diseases: Secondary | ICD-10-CM | POA: Diagnosis not present

## 2020-02-15 ENCOUNTER — Other Ambulatory Visit: Payer: Self-pay

## 2020-02-15 ENCOUNTER — Ambulatory Visit (INDEPENDENT_AMBULATORY_CARE_PROVIDER_SITE_OTHER): Payer: Medicare Other | Admitting: Neurology

## 2020-02-15 ENCOUNTER — Encounter: Payer: Self-pay | Admitting: Internal Medicine

## 2020-02-15 ENCOUNTER — Telehealth: Payer: Self-pay

## 2020-02-15 ENCOUNTER — Encounter: Payer: Self-pay | Admitting: Neurology

## 2020-02-15 VITALS — BP 152/71 | HR 62 | Ht 69.0 in | Wt 179.0 lb

## 2020-02-15 DIAGNOSIS — Z5181 Encounter for therapeutic drug level monitoring: Secondary | ICD-10-CM

## 2020-02-15 DIAGNOSIS — G40211 Localization-related (focal) (partial) symptomatic epilepsy and epileptic syndromes with complex partial seizures, intractable, with status epilepticus: Secondary | ICD-10-CM

## 2020-02-15 DIAGNOSIS — R269 Unspecified abnormalities of gait and mobility: Secondary | ICD-10-CM | POA: Diagnosis not present

## 2020-02-15 MED ORDER — PHENYTOIN 50 MG PO CHEW: 75.0000 mg | CHEWABLE_TABLET | Freq: Every day | ORAL | 1 refills | Status: DC

## 2020-02-15 NOTE — Telephone Encounter (Signed)
NA normal Last creatinine 0.8 new one 1.37 = what changed? Hydration? Nsaids? I don't about?

## 2020-02-15 NOTE — Progress Notes (Signed)
Reason for visit: Seizures  Cameron Potts is an 84 y.o. male  History of present illness:  Cameron Potts is an 84 year old right-handed white male with a history of intractable seizures.  The patient has had surgery in the right anterior temporal region to help control seizures, he has a vagal nerve stimulator in place.  The patient has some problems with orthostatic hypotension, he was told that he had Parkinson's disease or multisystem atrophy, but he has never had a gradual declining course with his mobility.  He uses a walker to get around, he has not had any falls.  He has not had any recent seizures.  He is on Dilantin, Vimpat, and Keppra for seizure control.  Managing his Dilantin has been challenging as he is also on Coumadin.  The patient is to have blood work done within the next several weeks.  He recently had a fall, possibly a blackout that occurred on 31 January 2020.  He went to the emergency room and a CT scan of the brain was done and was unremarkable.  There is some question of a new right centrum semiovale stroke, but when compared to a prior study, this is a chronic lesion, nothing new.  The patient indicates that he has good mobility in general.  Past Medical History:  Diagnosis Date  . Abnormality of gait 12/21/2013  . Atrial fibrillation ([place])   . Blind left eye   . Carcinoma in situ of prostate   . Cardiac arrest ([place])   . Glaucoma   . Hypercholesteremia   . Hyperlipidemia   . Metabolic encephalopathy   . Mild left ventricular systolic dysfunction   . Presence of other specified functional implants   . PSA elevation   . Right frontal lobe lesion   . Seizures ([place])   . Tinea pedis   . 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 Urinary urgency     Past Surgical History:  Procedure Laterality Date  . CARDIAC DEFIBRILLATOR PLACEMENT    . cataract surgery    . COLONOSCOPY    . CRANIOTOMY Right    right anterior temporal resection  . defibrillator  replaced  March 2016  . IMPLANTATION VAGAL NERVE STIMULATOR  June 2016  . NASAL SINUS SURGERY    . TONSILLECTOMY     age 24  . VASECTOMY  1985    Family History  Problem Relation Age of Onset  . Cancer - Lung Mother   . Cancer - Prostate Father     Social history:  reports that he has never smoked. He has never used smokeless tobacco. He reports previous alcohol use. He reports that he does not use drugs.    Allergies  Allergen Reactions  . Depakote [Divalproex Sodium] Other (See Comments)    Arthralgias     Medications:  Prior to Admission medications   Medication Sig Start Date End Date Taking? Authorizing Provider  ALPRAZolam (XANAX) 0.25 MG tablet Take 1 tablet (0.25 mg total) by mouth 2 (two) times daily. 10/08/19  Yes Wert, Margreta Journey, NP  brimonidine-timolol (COMBIGAN) 0.2-0.5 % ophthalmic solution Place 1 drop into both eyes every 12 (twelve) hours.  01/05/18  Yes [provider]  cholecalciferol (VITAMIN D) 400 units TABS tablet Take 400 Units by mouth 2 (two) times daily.   Yes [provider]  clobetasol cream (TEMOVATE) 0.78 % Apply 1 application topically 2 (two) times daily as needed (For Flares).   Yes [provider]  finasteride (  PROSCAR) 5 MG tablet Take 5 mg by mouth every morning.  02/12/19  Yes [provider]  flecainide (TAMBOCOR) 50 MG tablet Take 50 mg by mouth 2 (two) times daily.   Yes [provider]  fluocinonide ointment (LIDEX) 1.61 % Apply 1 application topically 2 (two) times daily as needed (on rash /skin folds, not for face).    Yes [provider]  ketoconazole (NIZORAL) 2 % shampoo Apply 1 application topically 2 (two) times a week. For seborrheic dermatitis on Monday and Thursday.   Yes [provider]  Lacosamide (VIMPAT) 150 MG TABS Take 1 tablet (150 mg total) by mouth 2 (two) times daily. 01/10/20  Yes Wert, Margreta Journey, NP  latanoprost (XALATAN) 0.005 % ophthalmic solution Place 1  drop into both eyes at bedtime.   Yes [provider]  levETIRAcetam (KEPPRA XR PO) Take 750 mg by mouth at bedtime.   Yes [provider]  Levetiracetam (KEPPRA XR) 750 MG TB24 Two tablets at 6 pm, 1 tablet at bedtime 03/18/19  Yes Kathrynn Ducking, MD  LORazepam (ATIVAN) 2 MG/ML concentrated solution Give 2 mg IM for seizure.If continued seizure activity after 15 minutes, give 2 mg again as needed. 10/02/19  Yes Reed, Tiffany L, DO  LORazepam (LORAZEPAM INTENSOL) 2 MG/ML concentrated solution Take 0.5 mLs (1 mg total) by mouth daily as needed (postictal anxiety). 09/29/19  Yes Reed, Tiffany L, DO  phenytoin (DILANTIN) 100 MG ER capsule 3 capsules in the evening 10/02/19  Yes Kathrynn Ducking, MD  phenytoin (DILANTIN) 50 MG tablet Chew 1.5 tablets (75 mg total) by mouth daily. Patient taking differently: Chew 30 mg by mouth daily.  01/31/20  Yes Kathrynn Ducking, MD  polyethylene glycol [facility] / Floria Raveling) packet Take 17 g by mouth See admin instructions. Once daily every other day and as needed for constipation   Yes [provider]  polyethylene glycol powder (GLYCOLAX/MIRALAX) 17 GM/SCOOP powder Take by mouth.   Yes [provider]  sodium chloride 1 g tablet Take 1 tablet (1 g total) by mouth 2 (two) times daily with a meal. 02/01/19  Yes Vasireddy, Grier Mitts, MD  tamsulosin (FLOMAX) 0.4 MG CAPS capsule Take 0.4 mg by mouth every morning.   Yes [provider]  triamcinolone cream (KENALOG) 0.1 % Apply 1 application topically 2 (two) times daily as needed (for flares).    Yes [provider]  warfarin (COUMADIN) 2.5 MG tablet Take 1 tablet (2.5 mg total) by mouth daily at 12 noon. 02/12/19  Yes Reed, Tiffany L, DO    ROS:  Out of a complete 14 system review of symptoms, the patient complains only of the following symptoms, and all other reviewed systems are negative.  Walking difficulty Seizures  Blood pressure (!) 152/71, pulse 62, height  5\' 9"  (1.753 m), weight 179 lb (81.2 kg).  Physical Exam  General: The patient is alert and cooperative at the time of the examination.  Skin: No significant peripheral edema is noted.   Neurologic Exam  Mental status: The patient is alert and oriented x 3 at the time of the examination. The patient has apparent normal recent and remote memory, with an apparently normal attention span and concentration ability.   Cranial nerves: Facial symmetry is present. Speech is normal, no aphasia or dysarthria is noted. Extraocular movements are full. Visual fields are full.  Motor: The patient has good strength in all 4 extremities.  Sensory examination: Soft touch sensation is symmetric on  the face, arms, and legs.  Coordination: The patient has good finger-nose-finger and heel-to-shin bilaterally.  Gait and station: The patient has the ability to walk with a walker, he has good stride and good turns with this.  Tandem gait was not attempted.  Romberg is negative but is somewhat unsteady.  Reflexes: Deep tendon reflexes are symmetric.   CT head 01/31/20:  IMPRESSION: 1. Age uncertain but potentially recent infarct in the superior right centrum semiovale extending to the gray-white junction in the posteromedial right frontal lobe.  2. Previous right temporal craniotomy with extensive right temporal lobe encephalomalacia.  3. Mild underlying atrophy. Mild periventricular small vessel disease. No mass or hemorrhage.  4.  Multifocal paranasal sinus disease.  5. Opacification of several mastoid air cells inferiorly on the Left.  * CT scan images were reviewed online. I agree with the written report.     Assessment/Plan:  1.  History of intractable seizures  2.  Gait disturbance  3.  Orthostatic hypotension  The patient in the past has had some problems with hyponatremia.  He will have blood work in the near future, we will add a Dilantin level to that.  He has a vagal  nerve stimulator in place.  We will continue the Keppra, Vimpat, and Dilantin.  We will use brand-name Dilantin 50 mg tablets.  He will follow-up here in 6 months.  Jill Alexanders MD 02/15/2020 11:08 AM  [facility] [address] [place], [place] [address]  Phone [phone] Fax [phone]

## 2020-02-16 ENCOUNTER — Non-Acute Institutional Stay: Payer: Medicare Other | Admitting: Internal Medicine

## 2020-02-16 ENCOUNTER — Encounter: Payer: Self-pay | Admitting: Internal Medicine

## 2020-02-16 ENCOUNTER — Other Ambulatory Visit: Payer: Self-pay

## 2020-02-16 VITALS — BP 150/72 | HR 63 | Temp 97.5°F | Ht 68.0 in | Wt 178.2 lb

## 2020-02-16 DIAGNOSIS — D6869 Other thrombophilia: Secondary | ICD-10-CM | POA: Diagnosis not present

## 2020-02-16 DIAGNOSIS — I4891 Unspecified atrial fibrillation: Secondary | ICD-10-CM

## 2020-02-16 LAB — POCT INR: INR: 2.5 (ref 2.0–3.0)

## 2020-02-16 NOTE — Progress Notes (Addendum)
INR is perfect.  Continue same dose of coumadin and recheck INR in 4 weeks in [facility].    Location:  Occupational psychologist of Service:   clinic  Provider: Nickia Boesen L. Mariea Clonts, D.O., C.M.D.  Code Status: DNR, MOST: Do not hospitalize Goals of Care:  Advanced Directives 01/31/2020  Does Patient Have a Medical Advance Directive? No  Type of Advance Directive -  Does patient want to make changes to medical advance directive? -  Copy of [place] in Chart? -  Would patient like information on creating a medical advance directive? No - Patient declined  Pre-existing out of facility DNR order (yellow form or pink MOST form) -     Chief Complaint  Patient presents with  . Medical Management of Chronic Issues    INR check     HPI: Patient is a 84 y.o. male seen today for INR check:  INR was 2.5 today on warfarin 2.5mg  daily.  He's had no complications.  His diet has not changed.  His dilantin was changed recently by neurology.  He came at a different time from his scheduled appt as usual and requested his fingerstick INR at that time.    Past Medical History:  Diagnosis Date  . Abnormality of gait 12/21/2013  . Atrial fibrillation ([place])   . Blind left eye   . Carcinoma in situ of prostate   . Cardiac arrest ([place])   . Glaucoma   . Hypercholesteremia   . Hyperlipidemia   . Metabolic encephalopathy   . Mild left ventricular systolic dysfunction   . Presence of other specified functional implants   . PSA elevation   . Right frontal lobe lesion   . Seizures ([place])   . Tinea pedis   . 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 Urinary urgency     Past Surgical History:  Procedure Laterality Date  . CARDIAC DEFIBRILLATOR PLACEMENT    . cataract surgery    . COLONOSCOPY    . CRANIOTOMY Right    right anterior temporal resection  . defibrillator replaced  March 2016  . IMPLANTATION VAGAL NERVE STIMULATOR  June 2016  . NASAL SINUS SURGERY    .  TONSILLECTOMY     age 34  . VASECTOMY  1985    Allergies  Allergen Reactions  . Depakote [Divalproex Sodium] Other (See Comments)    Arthralgias     Outpatient Encounter Medications as of 02/16/2020  Medication Sig  . ALPRAZolam (XANAX) 0.25 MG tablet Take 1 tablet (0.25 mg total) by mouth 2 (two) times daily.  . brimonidine-timolol (COMBIGAN) 0.2-0.5 % ophthalmic solution Place 1 drop into both eyes every 12 (twelve) hours.   . cholecalciferol (VITAMIN D) 400 units TABS tablet Take 400 Units by mouth 2 (two) times daily.  . clobetasol cream (TEMOVATE) 0.93 % Apply 1 application topically 2 (two) times daily as needed (For Flares).  . finasteride (PROSCAR) 5 MG tablet Take 5 mg by mouth every morning.   . flecainide (TAMBOCOR) 50 MG tablet Take 50 mg by mouth 2 (two) times daily.  . fluocinonide ointment (LIDEX) 2.67 % Apply 1 application topically 2 (two) times daily as needed (on rash /skin folds, not for face).   Marland Kitchen ketoconazole (NIZORAL) 2 % shampoo Apply 1 application topically 2 (two) times a week. For seborrheic dermatitis on Monday and Thursday.  . Lacosamide (VIMPAT) 150 MG TABS Take 1 tablet (150 mg total) by mouth 2 (two) times daily.  Marland Kitchen  latanoprost (XALATAN) 0.005 % ophthalmic solution Place 1 drop into both eyes at bedtime.  . levETIRAcetam (KEPPRA XR PO) Take 750 mg by mouth at bedtime.  . Levetiracetam (KEPPRA XR) 750 MG TB24 Two tablets at 6 pm, 1 tablet at bedtime  . LORazepam (ATIVAN) 2 MG/ML concentrated solution Give 2 mg IM for seizure.If continued seizure activity after 15 minutes, give 2 mg again as needed.  Marland Kitchen LORazepam (LORAZEPAM INTENSOL) 2 MG/ML concentrated solution Take 0.5 mLs (1 mg total) by mouth daily as needed (postictal anxiety).  . phenytoin (DILANTIN) 100 MG ER capsule 3 capsules in the evening  . phenytoin (DILANTIN) 50 MG tablet Chew 1.5 tablets (75 mg total) by mouth daily.  . polyethylene glycol (MIRALAX / GLYCOLAX) packet Take 17 g by mouth See admin  instructions. Once daily every other day and as needed for constipation  . polyethylene glycol powder (GLYCOLAX/MIRALAX) 17 GM/SCOOP powder Take by mouth.  . sodium chloride 1 g tablet Take 1 tablet (1 g total) by mouth 2 (two) times daily with a meal.  . tamsulosin (FLOMAX) 0.4 MG CAPS capsule Take 0.4 mg by mouth every morning.  . triamcinolone cream (KENALOG) 0.1 % Apply 1 application topically 2 (two) times daily as needed (for flares).   . warfarin (COUMADIN) 2.5 MG tablet Take 1 tablet (2.5 mg total) by mouth daily at 12 noon.   No facility-administered encounter medications on file as of 02/16/2020.    Review of Systems:  ROS  Health Maintenance  Topic Date Due  . INFLUENZA VACCINE  01/16/2020  . TETANUS/TDAP  12/06/2025  . COVID-19 Vaccine  Completed  . PNA vac Low Risk Adult  Completed    Physical Exam: Vitals:   02/16/20 1048  BP: (!) 150/72  Pulse: 63  Temp: (!) 97.5 F (36.4 C)  TempSrc: Temporal  SpO2: 97%  Weight: 178 lb 3.2 oz (80.8 kg)  Height: 5\' 8"  (1.727 m)   Body mass index is 27.1 kg/m. Physical Exam Vitals reviewed.  Constitutional:      Appearance: Normal appearance.  Cardiovascular:     Rate and Rhythm: Rhythm irregular.  Musculoskeletal:        General: Normal range of motion.     Right lower leg: No edema.     Left lower leg: No edema.     Comments: Uses rollator walker  Neurological:     Mental Status: He is alert. Mental status is at baseline.  Psychiatric:        Mood and Affect: Mood normal.     Labs reviewed: Basic Metabolic Panel: Recent Labs    08/02/19 1128 08/02/19 1128 11/11/19 0000 01/03/20 0000 01/31/20 0000  NA 137   < > 140 138 139  K 5.1   < > 4.6 4.8 4.6  CL 102   < > 103 108 96*  CO2 17*   < > 21 25* 30*  GLUCOSE 84  --   --   --   --   BUN 14   < > 12 13 32*  CREATININE 0.81  --  0.7 0.8 1.4*  CALCIUM 9.1   < > 8.9 8.6* 10.4   < > = values in this interval not displayed.   Liver Function Tests: Recent  Labs    08/02/19 1128 01/03/20 0000  AST 31 16  ALT 16 12  ALKPHOS 156* 120  BILITOT <0.2  --   PROT 7.6  --   ALBUMIN 4.9* 3.9   No  results for input(s): LIPASE, AMYLASE in the last 8760 hours. No results for input(s): AMMONIA in the last 8760 hours. CBC: Recent Labs    08/02/19 1128 01/03/20 0000  WBC 5.7 4.8  NEUTROABS 2.7  --   HGB 13.9 12.6*  HCT 38.9 36*  MCV 92  --   PLT 225 205   Lipid Panel: Recent Labs    01/03/20 0000  CHOL 212*  HDL 53  LDLCALC 143  TRIG 79   No results found for: HGBA1C  Procedures since last visit: CT Head Wo Contrast  Result Date: 01/31/2020 CLINICAL DATA:  Pain following fall EXAM: CT HEAD WITHOUT CONTRAST TECHNIQUE: Contiguous axial images were obtained from the base of the skull through the vertex without intravenous contrast. COMPARISON:  None. FINDINGS: Brain: There is mild diffuse atrophy. There is no intracranial mass, hemorrhage, extra-axial fluid collection, or midline shift. There is evidence of encephalomalacia throughout much of the right temporal lobe region. There is an age uncertain but potentially recent infarct in the superior right centrum semiovale extending to the superior right posterior frontal gray-white junction. Elsewhere there is slight small vessel disease in the centra semiovale bilaterally. Vascular: No hyperdense vessel. Calcification noted in each carotid siphon and distal vertebral artery region. Skull: Patient has had a previous right temporal craniotomy. Bony calvarium otherwise intact. Sinuses/Orbits: There is mucosal thickening in the maxillary antra bilaterally. A lesser degree of mucosal thickening noted in the sphenoid sinuses. There is opacification in multiple ethmoid air cells. Orbits appear symmetric bilaterally. Other: Mastoid air cells on the right are clear. There is opacification of several inferior mastoids on the left. IMPRESSION: 1. Age uncertain but potentially recent infarct in the superior  right centrum semiovale extending to the gray-white junction in the posteromedial right frontal lobe. 2. Previous right temporal craniotomy with extensive right temporal lobe encephalomalacia. 3. Mild underlying atrophy. Mild periventricular small vessel disease. No mass or hemorrhage. 4.  Multifocal paranasal sinus disease. 5. Opacification of several mastoid air cells inferiorly on the left. Electronically Signed   By: Lowella Grip III M.D.   On: 01/31/2020 11:46    Assessment/Plan 1. Hypercoagulability due to atrial fibrillation (HCC) - INR therapeutic at 2.5, cont same dose of coumadin 2.5mg  daily - POC IN[facility] recheck in 4 wks in [facility]  Labs/tests ordered:  INR in 4 wks Next appt:  03/15/2020 for INR check   Jalayne Ganesh L. Gid Schoffstall, D.O. [facility] [address] [place], [place] [address] Cell Phone (Mon-Fri 8am-5pm):  [phone] On Call:  [phone] & follow prompts after 5pm & weekends Office Phone:  [phone] Office Fax:  [phone]

## 2020-02-17 ENCOUNTER — Telehealth: Payer: Self-pay

## 2020-02-17 NOTE — Telephone Encounter (Signed)
Incoming call received from patient inquiring why he has an appointment pending for 03/15/2020.  I informed patient for a 4 week PT/INR check. Patient asked that I cancel appointment for he will not be coming to that appointment. Patient states "My INR was exactly where it needs to be yesterday and there is no need to be checking it again in 3-4 weeks."  I tried to inform patient of the importance and necessity for the visit according to the standards for patients taking warfarin. Patient states " I don't care about any of that, that you are talking about, I know that I do not need an INR checked again on 03/15/2020."  I informed patient that unfortunately I can not cancel appointment without the authorization from Dr.Reed. Patient aware I will call him back after consulting with his PCP.

## 2020-02-17 NOTE — Telephone Encounter (Signed)
This happens monthly and he usually agrees when the time comes.

## 2020-02-21 DIAGNOSIS — G40911 Epilepsy, unspecified, intractable, with status epilepticus: Secondary | ICD-10-CM | POA: Diagnosis not present

## 2020-02-21 DIAGNOSIS — E871 Hypo-osmolality and hyponatremia: Secondary | ICD-10-CM | POA: Diagnosis not present

## 2020-02-21 LAB — BASIC METABOLIC PANEL
BUN: 12 (ref 4–21)
CO2: 22 (ref 13–22)
Chloride: 94 — AB (ref 99–108)
Creatinine: 0.7 (ref 0.6–1.3)
Glucose: 113
Potassium: 4.5 (ref 3.4–5.3)
Sodium: 128 — AB (ref 137–147)

## 2020-02-21 LAB — COMPREHENSIVE METABOLIC PANEL: Calcium: 9.2 (ref 8.7–10.7)

## 2020-03-10 ENCOUNTER — Encounter: Payer: Self-pay | Admitting: Internal Medicine

## 2020-03-15 ENCOUNTER — Encounter: Payer: Self-pay | Admitting: Internal Medicine

## 2020-03-15 ENCOUNTER — Other Ambulatory Visit: Payer: Self-pay

## 2020-03-15 ENCOUNTER — Non-Acute Institutional Stay: Payer: Medicare Other | Admitting: Internal Medicine

## 2020-03-15 VITALS — BP 138/78 | HR 65 | Temp 97.5°F | Ht 68.0 in | Wt 176.8 lb

## 2020-03-15 DIAGNOSIS — N401 Enlarged prostate with lower urinary tract symptoms: Secondary | ICD-10-CM

## 2020-03-15 DIAGNOSIS — I4891 Unspecified atrial fibrillation: Secondary | ICD-10-CM

## 2020-03-15 DIAGNOSIS — G40909 Epilepsy, unspecified, not intractable, without status epilepticus: Secondary | ICD-10-CM | POA: Diagnosis not present

## 2020-03-15 DIAGNOSIS — E871 Hypo-osmolality and hyponatremia: Secondary | ICD-10-CM

## 2020-03-15 DIAGNOSIS — D6869 Other thrombophilia: Secondary | ICD-10-CM | POA: Diagnosis not present

## 2020-03-15 DIAGNOSIS — I482 Chronic atrial fibrillation, unspecified: Secondary | ICD-10-CM | POA: Diagnosis not present

## 2020-03-15 DIAGNOSIS — R350 Frequency of micturition: Secondary | ICD-10-CM | POA: Diagnosis not present

## 2020-03-15 LAB — POCT INR: INR: 2.5 (ref 2.0–3.0)

## 2020-03-15 NOTE — Progress Notes (Signed)
Location:  [facility]up[facility] psychologist of Service:  Clinic (12)  Provider: Saranda Legrande L. Mariea Clonts, D.O., C.M.D.  Code Status: DNR, MOST Goals of Care:  Advanced Directives 03/15/2020  Does Patient Have a Medical Advance Directive? Yes  Type of Advance Directive Out of facility DNR (pink MOST or yellow form)  Does patient want to make changes to medical advance directive? No - Guardian declined  Copy of [place] in Chart? -  Would patient like information on creating a medical advance directive? -  Pre-existing out of facility DNR order (yellow form or pink MOST form) -     Chief Complaint  Patient presents with  . Medical Management of Chronic Issues    INR check   . Acute Visit    discuss pushing INR checks out to 4 or 5 months because his medication is working just fine  . Health Maintenance    Influenza ([place])    HPI: Patient is a 84 y.o. male seen today INR check and med mgt.    INR today was 2.5.  This is perfect so will continue same dose of coumadin and recheck in 4 weeks.  It's not safe to move it out to 4-5 months b/c his neurologist changes his dilantin doses at times and it alters his INR.  He tells me his seizure history again today.    Na was 128 three weeks ago on 9/6.  He was getting ill with the sodium tablets--nausea and vomited.  He knows he needs to drink more gatorade or put more salt on his food.    Past Medical History:  Diagnosis Date  . Abnormality of gait 12/21/2013  . Atrial fibrillation ([place])   . Blind left eye   . Carcinoma in situ of prostate   . Cardiac arrest ([place])   . Glaucoma   . Hypercholesteremia   . Hyperlipidemia   . Metabolic encephalopathy   . Mild left ventricular systolic dysfunction   . Presence of other specified functional implants   . PSA elevation   . Right frontal lobe lesion   . Seizures ([place])   . Tinea pedis   . 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 Urinary urgency     Past  Surgical History:  Procedure Laterality Date  . CARDIAC DEFIBRILLATOR PLACEMENT    . cataract surgery    . COLONOSCOPY    . CRANIOTOMY Right    right anterior temporal resection  . defibrillator replaced  March 2016  . IMPLANTATION VAGAL NERVE STIMULATOR  June 2016  . NASAL SINUS SURGERY    . TONSILLECTOMY     age 63  . VASECTOMY  1985    Allergies  Allergen Reactions  . Depakote [Divalproex Sodium] Other (See Comments)    Arthralgias     Outpatient Encounter Medications as of 03/15/2020  Medication Sig  . ALPRAZolam (XANAX) 0.25 MG tablet Take 1 tablet (0.25 mg total) by mouth 2 (two) times daily.  . brimonidine-timolol (COMBIGAN) 0.2-0.5 % ophthalmic solution Place 1 drop into both eyes every 12 (twelve) hours.   . cholecalciferol (VITAMIN D) 400 units TABS tablet Take 400 Units by mouth 2 (two) times daily.  . clobetasol cream (TEMOVATE) 4.03 % Apply 1 application topically 2 (two) times daily as needed (For Flares).  . finasteride (PROSCAR) 5 MG tablet Take 5 mg by mouth every morning.   . flecainide (TAMBOCOR) 50 MG tablet Take 50 mg by mouth 2 (two) times daily.  Marland Kitchen  fluocinonide ointment (LIDEX) 5.85 % Apply 1 application topically 2 (two) times daily as needed (on rash /skin folds, not for face).   Marland Kitchen ketoconazole (NIZORAL) 2 % shampoo Apply 1 application topically 2 (two) times a week. For seborrheic dermatitis on Monday and Thursday.  . Lacosamide (VIMPAT) 150 MG TABS Take 1 tablet (150 mg total) by mouth 2 (two) times daily.  Marland Kitchen latanoprost (XALATAN) 0.005 % ophthalmic solution Place 1 drop into both eyes at bedtime.  . levETIRAcetam (KEPPRA XR PO) Take 750 mg by mouth at bedtime.  . Levetiracetam (KEPPRA XR) 750 MG TB24 Two tablets at 6 pm, 1 tablet at bedtime  . LORazepam (ATIVAN) 2 MG/ML concentrated solution Give 2 mg IM for seizure.If continued seizure activity after 15 minutes, give 2 mg again as needed.  Marland Kitchen LORazepam (LORAZEPAM INTENSOL) 2 MG/ML concentrated solution  Take 0.5 mLs (1 mg total) by mouth daily as needed (postictal anxiety).  . phenytoin (DILANTIN) 100 MG ER capsule Take by mouth 3 (three) times daily. Special instruction: Dilantin 100 mg take 3 capsules (300) mg in the evening. Once an evening 06:30  . polyethylene glycol (MIRALAX / GLYCOLAX) packet Take 17 g by mouth See admin instructions. Once daily every other day and as needed for constipation  . polyethylene glycol powder (GLYCOLAX/MIRALAX) 17 GM/SCOOP powder Take by mouth.  . sodium chloride 1 g tablet Take 1 tablet (1 g total) by mouth 2 (two) times daily with a meal.  . tamsulosin (FLOMAX) 0.4 MG CAPS capsule Take 0.4 mg by mouth every morning.  . triamcinolone cream (KENALOG) 0.1 % Apply 1 application topically 2 (two) times daily as needed (for flares).   . warfarin (COUMADIN) 2.5 MG tablet Take 1 tablet (2.5 mg total) by mouth daily at 12 noon.  . [DISCONTINUED] phenytoin (DILANTIN) 100 MG ER capsule 3 capsules in the evening  . [DISCONTINUED] phenytoin (DILANTIN) 50 MG tablet Chew 1.5 tablets (75 mg total) by mouth daily.   No facility-administered encounter medications on file as of 03/15/2020.    Review of Systems:  Review of Systems  Constitutional: Negative for chills, fever and malaise/fatigue.  HENT: Negative for congestion.   Eyes: Negative for blurred vision.  Respiratory: Negative for cough and shortness of breath.   Cardiovascular: Negative for chest pain and leg swelling.  Gastrointestinal: Negative for abdominal pain and constipation.  Genitourinary: Negative for dysuria.  Musculoskeletal: Negative for falls and joint pain.  Skin: Negative for itching and rash.  Neurological: Positive for dizziness. Negative for seizures and loss of consciousness.  Endo/Heme/Allergies: Bruises/bleeds easily.  Psychiatric/Behavioral: Positive for memory loss. The patient is nervous/anxious. The patient does not have insomnia.     Health Maintenance  Topic Date Due  . INFLUENZA  VACCINE  01/16/2020  . TETANUS/TDAP  12/06/2025  . COVID-19 Vaccine  Completed  . PNA vac Low Risk Adult  Completed    Physical Exam: Vitals:   03/15/20 1127  BP: 138/78  Pulse: 65  Temp: (!) 97.5 F (36.4 C)  SpO2: 98%  Weight: 176 lb 12.8 oz (80.2 kg)  Height: 5\' 8"  (1.727 m)   Body mass index is 26.88 kg/m. Physical Exam Vitals reviewed.  Constitutional:      General: He is not in acute distress.    Appearance: Normal appearance. He is not toxic-appearing.  HENT:     Head: Normocephalic and atraumatic.  Cardiovascular:     Rate and Rhythm: Rhythm irregular.     Heart sounds: No murmur heard.  Pulmonary:     Effort: Pulmonary effort is normal.     Breath sounds: Normal breath sounds. No wheezing, rhonchi or rales.  Abdominal:     General: Bowel sounds are normal.     Palpations: Abdomen is soft.  Musculoskeletal:        General: Normal range of motion.     Right lower leg: No edema.     Left lower leg: No edema.  Skin:    General: Skin is warm and dry.  Neurological:     General: No focal deficit present.     Mental Status: He is alert.     Cranial Nerves: No cranial nerve deficit.     Gait: Gait abnormal.     Comments: Uses rollator walker  Psychiatric:        Mood and Affect: Mood normal.        Behavior: Behavior normal.     Labs reviewed: Basic Metabolic Panel: Recent Labs    08/02/19 1128 11/11/19 0000 01/03/20 0000 01/31/20 0000 02/21/20 0000  NA 137   < > 138 139 128*  K 5.1   < > 4.8 4.6 4.5  CL 102   < > 108 96* 94*  CO2 17*   < > 25* 30* 22  GLUCOSE 84  --   --   --   --   BUN 14   < > 13 32* 12  CREATININE 0.81   < > 0.8 1.4* 0.7  CALCIUM 9.1   < > 8.6* 10.4 9.2   < > = values in this interval not displayed.   Liver Function Tests: Recent Labs    08/02/19 1128 01/03/20 0000  AST 31 16  ALT 16 12  ALKPHOS 156* 120  BILITOT <0.2  --   PROT 7.6  --   ALBUMIN 4.9* 3.9   No results for input(s): LIPASE, AMYLASE in the last  8760 hours. No results for input(s): AMMONIA in the last 8760 hours. CBC: Recent Labs    08/02/19 1128 01/03/20 0000  WBC 5.7 4.8  NEUTROABS 2.7  --   HGB 13.9 12.6*  HCT 38.9 36*  MCV 92  --   PLT 225 205   Lipid Panel: Recent Labs    01/03/20 0000  CHOL 212*  HDL 53  LDLCALC 143  TRIG 79   No results found for: HGBA1C  Procedures since last visit: No results found.  Assessment/Plan 1. Hypercoagulability due to atrial fibrillation (HCC) - POC INR was perfect at 2.5, cont same dose of warfarin at noon and recheck in 4 wks in [facility]  2. Chronic atrial fibrillation (HCC) -rate controlled, no related issues recently  3. Hyponatremia -last Na was low--he had not been faithful with gatorade and was drinking too much straight water, reminded him about this today since he was not tolerating the sodium tablets  4. Seizure disorder (HCC) -cont dilantin per Dr. Jannifer Franklin  5. BPH with frequency -only 2x per night, rarely 3, he says -plans to f/u with urology  Labs/tests ordered:  * No order type specified * Next appt:  Visit date not found  Latanya Hemmer L. Keyanni Whittinghill, D.O. [facility] [address], [place] [address] Cell Phone (Mon-Fri 8am-5pm):  [phone] On Call:  [phone] & follow prompts after 5pm & weekends Office Phone:  [phone] Office Fax:  [phone]

## 2020-03-15 NOTE — Progress Notes (Signed)
Perfect, continue same dose and recheck in 4 weeks in [facility].

## 2020-04-04 ENCOUNTER — Other Ambulatory Visit: Payer: Self-pay | Admitting: Internal Medicine

## 2020-04-04 MED ORDER — ALPRAZOLAM 0.25 MG PO TABS
0.2500 mg | ORAL_TABLET | Freq: Two times a day (BID) | ORAL | 5 refills | Status: DC
Start: 2020-04-04 — End: 2020-10-04

## 2020-04-05 ENCOUNTER — Encounter: Payer: Self-pay | Admitting: Internal Medicine

## 2020-04-05 DIAGNOSIS — E871 Hypo-osmolality and hyponatremia: Secondary | ICD-10-CM | POA: Diagnosis not present

## 2020-04-05 DIAGNOSIS — G40909 Epilepsy, unspecified, not intractable, without status epilepticus: Secondary | ICD-10-CM | POA: Diagnosis not present

## 2020-04-10 ENCOUNTER — Telehealth: Payer: Self-pay | Admitting: Neurology

## 2020-04-10 NOTE — Telephone Encounter (Signed)
The most recent Dilantin level was on 05 April 2020, level of 14.7.  A comprehensive metabolic profile revealed a glucose of 124, calcium 8.8, creatinine 0.8, BUN of 14.3, sodium 131, potassium 4.6, chloride 95, CO2 24.  Since September 2021, his sodium levels have been low, previous to this he was running 139-140.  The patient has been on Dilantin for a number of years, not clear why the sodium levels have dropped suddenly.  The patient may need to restrict fluid intake to some degree.  Apparently sodium tablets have been readded to the diet.

## 2020-04-12 ENCOUNTER — Other Ambulatory Visit: Payer: Self-pay

## 2020-04-12 ENCOUNTER — Encounter: Payer: Medicare Other | Admitting: Internal Medicine

## 2020-04-18 ENCOUNTER — Telehealth: Payer: Self-pay | Admitting: Neurology

## 2020-04-18 NOTE — Telephone Encounter (Signed)
Pt. States he urinated 5 times last night & that's way too many times to be using the bathroom in the middle of the night. He is asking for a decrease of sodium chloride 1 g tablet. Would like to be called by RN. Please advise.

## 2020-04-18 NOTE — Telephone Encounter (Signed)
Called patient back and left message on vm for return call.

## 2020-04-19 ENCOUNTER — Other Ambulatory Visit: Payer: Self-pay

## 2020-04-19 ENCOUNTER — Encounter: Payer: Medicare Other | Admitting: Internal Medicine

## 2020-04-19 ENCOUNTER — Encounter: Payer: Self-pay | Admitting: Internal Medicine

## 2020-04-24 DIAGNOSIS — R351 Nocturia: Secondary | ICD-10-CM | POA: Diagnosis not present

## 2020-04-24 DIAGNOSIS — N401 Enlarged prostate with lower urinary tract symptoms: Secondary | ICD-10-CM | POA: Diagnosis not present

## 2020-04-27 DIAGNOSIS — Z23 Encounter for immunization: Secondary | ICD-10-CM | POA: Diagnosis not present

## 2020-04-27 DIAGNOSIS — E871 Hypo-osmolality and hyponatremia: Secondary | ICD-10-CM | POA: Diagnosis not present

## 2020-04-27 DIAGNOSIS — G40911 Epilepsy, unspecified, intractable, with status epilepticus: Secondary | ICD-10-CM | POA: Diagnosis not present

## 2020-04-27 LAB — BASIC METABOLIC PANEL
BUN: 15 (ref 4–21)
CO2: 25 — AB (ref 13–22)
Chloride: 97 — AB (ref 99–108)
Creatinine: 0.8 (ref 0.6–1.3)
Glucose: 110
Potassium: 4.5 (ref 3.4–5.3)
Sodium: 132 — AB (ref 137–147)

## 2020-04-27 LAB — COMPREHENSIVE METABOLIC PANEL: Calcium: 9 (ref 8.7–10.7)

## 2020-05-02 ENCOUNTER — Telehealth: Payer: Self-pay | Admitting: Neurology

## 2020-05-02 NOTE — Telephone Encounter (Signed)
Pt has called back to inform the matter has been resolved and he no longer needs a call back.

## 2020-05-02 NOTE — Telephone Encounter (Signed)
Pt called, take sodium tablets at 7 am twice a day. Can I take sodium tablets as early as 6 am. Would like a call from the nurse.

## 2020-05-03 ENCOUNTER — Telehealth: Payer: Self-pay | Admitting: Neurology

## 2020-05-03 NOTE — Telephone Encounter (Signed)
[name] RN called to ask when patient is having a seizure, does anyone get to swipe magnet of vagus nerve stimulator or only Therapist, sports. Please advise.  Best contact: [phone]

## 2020-05-04 NOTE — Telephone Encounter (Signed)
Called and spoke to [place], [place] on unit where patient resides.  Explained/educated that anybody can use the magnet for patient care/seizure control.

## 2020-05-04 NOTE — Telephone Encounter (Signed)
Called and spoke to [place], [place] on unit where patient resides.  Explained/educated that anybody can use the magnet for patient care/seizure control.  Went over a few basic points when using or training someone to use the magnet.    The magnet can be used by the person with seizures or by another person (does not have to be RN), family, friends and caregivers can use the magnet.     Swipe the magnet across the stimulator (usually on the left side of the chest) for one second, start in the middle of the chest and swipe across the sitmulator for approximately 1 second.     Wait approximately 1 minute and if the seizure is still going on, swipe the magnet across the stimulator again.   Don't hold the magnet over the generator - the magnet can turn the generator off  If they have any additional questions, to please call of our office.

## 2020-05-08 ENCOUNTER — Telehealth: Payer: Self-pay | Admitting: Neurology

## 2020-05-08 NOTE — Telephone Encounter (Signed)
The patient had blood work reveals a sodium level of 132, chloride of 97.  Dilantin level is 20.3.  I will opt not to alter the Dilantin dosing at this time.  The patient generally does fairly well running between 18-20 with the Dilantin levels.

## 2020-05-23 LAB — POCT INR: INR: 3 — AB (ref <=1.1)

## 2020-06-20 DIAGNOSIS — Z7901 Long term (current) use of anticoagulants: Secondary | ICD-10-CM | POA: Diagnosis not present

## 2020-06-20 LAB — POCT INR: INR: 2.8 — AB (ref 0.9–1.1)

## 2020-07-05 ENCOUNTER — Other Ambulatory Visit: Payer: Self-pay

## 2020-07-05 ENCOUNTER — Encounter: Payer: Self-pay | Admitting: Internal Medicine

## 2020-07-05 ENCOUNTER — Non-Acute Institutional Stay: Payer: Medicare Other | Admitting: Internal Medicine

## 2020-07-05 VITALS — BP 122/82 | HR 78 | Temp 97.5°F | Ht 68.0 in | Wt 175.6 lb

## 2020-07-05 DIAGNOSIS — E871 Hypo-osmolality and hyponatremia: Secondary | ICD-10-CM | POA: Diagnosis not present

## 2020-07-05 DIAGNOSIS — G20 Parkinson's disease: Secondary | ICD-10-CM

## 2020-07-05 DIAGNOSIS — I951 Orthostatic hypotension: Secondary | ICD-10-CM | POA: Diagnosis not present

## 2020-07-05 DIAGNOSIS — Z9689 Presence of other specified functional implants: Secondary | ICD-10-CM

## 2020-07-05 DIAGNOSIS — R350 Frequency of micturition: Secondary | ICD-10-CM | POA: Diagnosis not present

## 2020-07-05 DIAGNOSIS — L111 Transient acantholytic dermatosis [Grover]: Secondary | ICD-10-CM

## 2020-07-05 DIAGNOSIS — D6869 Other thrombophilia: Secondary | ICD-10-CM | POA: Diagnosis not present

## 2020-07-05 DIAGNOSIS — Z95810 Presence of automatic (implantable) cardiac defibrillator: Secondary | ICD-10-CM | POA: Diagnosis not present

## 2020-07-05 DIAGNOSIS — N401 Enlarged prostate with lower urinary tract symptoms: Secondary | ICD-10-CM | POA: Diagnosis not present

## 2020-07-05 DIAGNOSIS — I482 Chronic atrial fibrillation, unspecified: Secondary | ICD-10-CM

## 2020-07-05 DIAGNOSIS — G40909 Epilepsy, unspecified, not intractable, without status epilepticus: Secondary | ICD-10-CM

## 2020-07-05 DIAGNOSIS — I4891 Unspecified atrial fibrillation: Secondary | ICD-10-CM | POA: Diagnosis not present

## 2020-07-05 NOTE — Progress Notes (Signed)
Location:  Occupational psychologist of Service:  Clinic (12)  Provider: Alexsia Klindt L. Mariea Clonts, D.O., C.M.D.  Code Status: DNR Goals of Care:  Advanced Directives 07/05/2020  Does Patient Have a Medical Advance Directive? Yes  Type of Advance Directive Out of facility DNR (pink MOST or yellow form)  Does patient want to make changes to medical advance directive? No - Patient declined  Copy of [place] in Chart? -  Would patient like information on creating a medical advance directive? -  Pre-existing out of facility DNR order (yellow form or pink MOST form) Pink MOST/Yellow Form most recent copy in chart - Physician notified to receive inpatient order   Chief Complaint  Patient presents with  . Acute Visit    Discuss BMP drawn to check his sodium levels     HPI: Patient is a 85 y.o. male seen today for acute visit to discussed needing his sodium checked.  Note patient has refused to come to several appts to have his INR done and this has resulted in no opportunities for [place] to order other recommended labs.  He is adamant that he wants his sodium and phenytoin levels checked regularly, but doesn't want his INR every month though it's necessary.  He thinks that is excessive.  He wants to know the day before his blood work is due.    He once again brings up his urinary frequency again--saw Dr. Lovena Neighbours.  He was given tamsulosin and he's actually urinating even more often since.  Reviewing the notes, it appears there were no other suggestions to handle this situation at this point that the patient agreed to pursue.  He had an orthostatic spell two weeks ago and tore the skin on his arm during that fall.  It happens after he urinates.  He missed the railing when he became dizzy.    Had steroid in right contracture for 3rd digit but it's back.  He's using the ball to exercise it and it gets better at the end of the day.    Asks again if he needs all of his pills.     INR is due next 07/18/20.  Last was 2.8 on 2.5mg  daily early this month.  This is for afib.    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  CNA puts it on his back after his shower.  Dilantin was 20.3 on 04/27/20 Na was 132 on 04/27/20.  He remains on sodium tablets bid with meals and crushes them up in something or they make him sick.  Past Medical History:  Diagnosis Date  . Abnormality of gait 12/21/2013  . Atrial fibrillation ([place])   . Blind left eye   . Carcinoma in situ of prostate   . Cardiac arrest ([place])   . Glaucoma   . Hypercholesteremia   . Hyperlipidemia   . Metabolic encephalopathy   . Mild left ventricular systolic dysfunction   . Presence of other specified functional implants   . PSA elevation   . Right frontal lobe lesion   . Seizures ([place])   . Tinea pedis   . 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 Urinary urgency     Past Surgical History:  Procedure Laterality Date  . CARDIAC DEFIBRILLATOR PLACEMENT    . cataract surgery    . COLONOSCOPY    . CRANIOTOMY Right    right anterior temporal resection  . defibrillator replaced  March 2016  . IMPLANTATION  VAGAL NERVE STIMULATOR  June 2016  . NASAL SINUS SURGERY    . TONSILLECTOMY     age 22  . VASECTOMY  1985    Allergies  Allergen Reactions  . Depakote [Divalproex Sodium] Other (See Comments)    Arthralgias     Outpatient Encounter Medications as of 07/05/2020  Medication Sig  . ALPRAZolam (XANAX) 0.25 MG tablet Take 1 tablet (0.25 mg total) by mouth 2 (two) times daily.  . brimonidine-timolol (COMBIGAN) 0.2-0.5 % ophthalmic solution Place 1 drop into both eyes every 12 (twelve) hours.   . cholecalciferol (VITAMIN D) 400 units TABS tablet Take 400 Units by mouth 2 (two) times daily.  . clobetasol cream (TEMOVATE) AB-123456789 % Apply 1 application topically 2 (two) times daily as needed (For Flares).  . finasteride (PROSCAR) 5 MG tablet Take 5 mg by mouth every morning.   .  flecainide (TAMBOCOR) 50 MG tablet Take 50 mg by mouth 2 (two) times daily.  . fluocinonide ointment (LIDEX) AB-123456789 % Apply 1 application topically 2 (two) times daily as needed (on rash /skin folds, not for face).   Marland Kitchen ketoconazole (NIZORAL) 2 % shampoo Apply 1 application topically 2 (two) times a week. For seborrheic dermatitis on Monday and Thursday.  . Lacosamide (VIMPAT) 150 MG TABS Take 1 tablet (150 mg total) by mouth 2 (two) times daily.  Marland Kitchen latanoprost (XALATAN) 0.005 % ophthalmic solution Place 1 drop into both eyes at bedtime.  . levETIRAcetam (KEPPRA XR PO) Take 750 mg by mouth at bedtime.  . Levetiracetam (KEPPRA XR) 750 MG TB24 Two tablets at 6 pm, 1 tablet at bedtime  . LORazepam (ATIVAN) 2 MG/ML concentrated solution Give 2 mg IM for seizure.If continued seizure activity after 15 minutes, give 2 mg again as needed.  Marland Kitchen LORazepam (LORAZEPAM INTENSOL) 2 MG/ML concentrated solution Take 0.5 mLs (1 mg total) by mouth daily as needed (postictal anxiety).  . phenytoin (DILANTIN) 100 MG ER capsule Take by mouth 3 (three) times daily. Special instruction: Dilantin 100 mg take 3 capsules (300) mg in the evening. Once an evening 06:30  . phenytoin (DILANTIN) 50 MG tablet Chew 50 mg by mouth 3 (three) times daily. Chew 1.5 tablets (75 mg) by mouth daily one a morning (6:30) am  . polyethylene glycol (MIRALAX / GLYCOLAX) packet Take 17 g by mouth See admin instructions. Once daily every other day and as needed for constipation  . polyethylene glycol powder (GLYCOLAX/MIRALAX) 17 GM/SCOOP powder Take by mouth.  . sodium chloride 1 g tablet Take 1 tablet (1 g total) by mouth 2 (two) times daily with a meal.  . tamsulosin (FLOMAX) 0.4 MG CAPS capsule Take 0.4 mg by mouth every morning.  . triamcinolone cream (KENALOG) 0.1 % Apply 1 application topically 2 (two) times daily as needed (for flares).   . warfarin (COUMADIN) 2.5 MG tablet Take 1 tablet (2.5 mg total) by mouth daily at 12 noon.   No  facility-administered encounter medications on file as of 07/05/2020.    Review of Systems:  Review of Systems  Constitutional: Negative for chills, fever and weight loss.  HENT: Negative for congestion and sore throat.   Eyes: Positive for blurred vision.       Legal blindness in one eye  Respiratory: Negative for cough and shortness of breath.   Cardiovascular: Negative for chest pain, palpitations and leg swelling.  Gastrointestinal: Negative for abdominal pain, blood in stool, constipation, diarrhea and melena.  Genitourinary: Positive for frequency and urgency. Negative  for dysuria.       Incontinence  Musculoskeletal: Positive for falls.       Contracture of digit  Skin: Positive for rash.  Neurological: Negative for dizziness and loss of consciousness.       Parkinsonism, uses rollator  Endo/Heme/Allergies: Bruises/bleeds easily.  Psychiatric/Behavioral: Positive for memory loss. Negative for depression. The patient is not nervous/anxious and does not have insomnia.     Health Maintenance  Topic Date Due  . COVID-19 Vaccine (4 - Booster for Moderna series) 10/29/2020  . TETANUS/TDAP  12/06/2025  . INFLUENZA VACCINE  Completed  . PNA vac Low Risk Adult  Completed    Physical Exam: Vitals:   07/05/20 1009  BP: 122/82  Pulse: 78  Temp: (!) 97.5 F (36.4 C)  TempSrc: Temporal  SpO2: 95%  Weight: 175 lb 9.6 oz (79.7 kg)  Height: 5\' 8"  (1.727 m)   Body mass index is 26.7 kg/m. Physical Exam Vitals reviewed.  Constitutional:      General: He is not in acute distress.    Appearance: Normal appearance. He is not toxic-appearing.  HENT:     Head: Normocephalic and atraumatic.     Nose: Rhinorrhea present.  Cardiovascular:     Rate and Rhythm: Rhythm irregular.     Heart sounds: No murmur heard.   Pulmonary:     Effort: Pulmonary effort is normal.     Breath sounds: Normal breath sounds. No wheezing, rhonchi or rales.  Abdominal:     General: Bowel sounds are  normal.  Musculoskeletal:        General: Normal range of motion.     Right lower leg: No edema.     Left lower leg: No edema.  Skin:    Comments: Scaly area left temple/cheek area; abrasions of arms  Neurological:     General: No focal deficit present.     Mental Status: He is alert and oriented to person, place, and time.     Gait: Gait abnormal.     Comments: But short-term memory loss--discussions from visits are repeated each time  Psychiatric:        Mood and Affect: Mood normal.     Comments: Easily agitated about labs and likes to be in control     Labs reviewed: Basic Metabolic Panel: Recent Labs    08/02/19 1128 11/11/19 0000 01/31/20 0000 02/21/20 0000 04/27/20 0000  NA 137   < > 139 128* 132*  K 5.1   < > 4.6 4.5 4.5  CL 102   < > 96* 94* 97*  CO2 17*   < > 30* 22 25*  GLUCOSE 84  --   --   --   --   BUN 14   < > 32* 12 15  CREATININE 0.81   < > 1.4* 0.7 0.8  CALCIUM 9.1   < > 10.4 9.2 9.0   < > = values in this interval not displayed.   Liver Function Tests: Recent Labs    08/02/19 1128 01/03/20 0000  AST 31 16  ALT 16 12  ALKPHOS 156* 120  BILITOT <0.2  --   PROT 7.6  --   ALBUMIN 4.9* 3.9   No results for input(s): LIPASE, AMYLASE in the last 8760 hours. No results for input(s): AMMONIA in the last 8760 hours. CBC: Recent Labs    08/02/19 1128 01/03/20 0000  WBC 5.7 4.8  NEUTROABS 2.7  --   HGB 13.9 12.6*  HCT 38.9  36*  MCV 92  --   PLT 225 205   Lipid Panel: Recent Labs    01/03/20 0000  CHOL 212*  HDL 53  LDLCALC 143  TRIG 79   No results found for: HGBA1C   Assessment/Plan 1. Parkinsonian syndrome associated with symptomatic orthostatic hypotension (HCC) -ongoing, continue faithful use of rollator walker  2. Hyponatremia -last sodium satisfactory at 132, recheck with next INR  3. Chronic atrial fibrillation (HCC) -INR goal 2-3 on current dose -cont as is and has recheck early next month as above  4. Seizure  disorder ([place]) -refractory seizures; none since last seen, but has vagal stimulator, also dilantin, vimpat, keppra and prn lorazepam -has MOST for do not hospitalize if he has seizures which has been well discussed and documented  5. Hypercoagulability due to atrial fibrillation (HCC) -cont coumadin for INR goal 2-3--takes 2.5mg  daily at NOON (to be separated from his dilantin) -of note, it is to be checked at least monthly and if dilantin changed by neuro, but it's been challenging to get Mr. Starleen Blue to agree with more frequent checks if doses are adjusted and for all parties involved to be aware that dilantin was changed  6. Grover's disease -cont current kenalog cream which he reports has been fairly effective for him  7. ICD (implantable cardioverter-defibrillator) in place -noted, as well; no shocks delivered or related issues -when he first came, he was still traveling back to [facility] to see cardiology at times and he will sometimes talk about doing this but he does not  8. Status post VNS (vagus nerve stimulator) placement -for his seizures--has key that he can swipe that is kept on him at all times -if this does not stop the seizures, then the prn lorazepam is used and can be repeated -again, do not hospitalize order per his wishes on MOST  9. Benign prostatic hyperplasia with urinary frequency -ongoing frequency, but no other options that he agreed to at urology  Labs/tests ordered:  Dilantin, bmp, INR all on 07/18/20 via blood draw  Next appt:  Left without scheduling  Brynlee Pennywell L. Frankie Scipio, D.O. [facility] [address], [place] [address] Cell Phone (Mon-Fri 8am-5pm):  [phone] On Call:  [phone] & follow prompts after 5pm & weekends Office Phone:  [phone] Office Fax:  [phone]

## 2020-07-06 ENCOUNTER — Non-Acute Institutional Stay: Payer: Medicare Other | Admitting: Adult Health

## 2020-07-06 ENCOUNTER — Encounter: Payer: Self-pay | Admitting: Adult Health

## 2020-07-06 DIAGNOSIS — L089 Local infection of the skin and subcutaneous tissue, unspecified: Secondary | ICD-10-CM

## 2020-07-06 DIAGNOSIS — Z7901 Long term (current) use of anticoagulants: Secondary | ICD-10-CM

## 2020-07-06 DIAGNOSIS — T148XXA Other injury of unspecified body region, initial encounter: Secondary | ICD-10-CM

## 2020-07-06 NOTE — Progress Notes (Signed)
Location:  Medical illustrator of Service:  ALF (13) Provider:   Peggye Ley, ANP [facility] [phone]   Kermit Balo, DO  Patient Care Team: Kermit Balo, DO as PCP - General (Geriatric Medicine) Jethro Bolus, MD as Consulting Physician (Ophthalmology) [name], MD as Consulting Physician (Neurology) Kermit Balo, DO (Geriatric Medicine)  Extended Emergency Contact Information Primary Emergency Contact: Faraone,[name] Address: [address] [place], [place] [address] [place] of [place] Home Phone: [phone] Mobile Phone: [phone] Relation: Spouse Secondary Emergency Contact: Pellecchia,Altair Mobile Phone: [phone] Relation: Son  Code Status:  DNR Goals of care: Advanced Directive information Advanced Directives 07/05/2020  Does Patient Have a Medical Advance Directive? Yes  Type of Advance Directive Out of facility DNR (pink MOST or yellow form)  Does patient want to make changes to medical advance directive? No - Patient declined  Copy of Healthcare Power of Attorney in Chart? -  Would patient like information on creating a medical advance directive? -  Pre-existing out of facility DNR order (yellow form or pink MOST form) Pink MOST/Yellow Form most recent copy in chart - Physician notified to receive inpatient order     Chief Complaint  Patient presents with  . Acute Visit    Right skin tear infection     HPI:  Pt is a 85 y.o. male seen today for an acute visit for right skin tear redness and warmth. He obtained a large skin tear during a fall a week ago. The area has steri strips and a non adherent dressing. The nurse reports that during the dressing change she noted spreading redness and warmth. The patient denies any pain or discomfort. There is no numbness or change in function of the right arm. He is on coumadin for CVA risk prevention.    Past Medical History:  Diagnosis Date  .  Abnormality of gait 12/21/2013  . Atrial fibrillation (HCC)   . Blind left eye   . Carcinoma in situ of prostate   . Cardiac arrest (HCC)   . Glaucoma   . Hypercholesteremia   . Hyperlipidemia   . Metabolic encephalopathy   . Mild left ventricular systolic dysfunction   . Presence of other specified functional implants   . PSA elevation   . Right frontal lobe lesion   . Seizures (HCC)   . Tinea pedis   . 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 Urinary urgency    Past Surgical History:  Procedure Laterality Date  . CARDIAC DEFIBRILLATOR PLACEMENT    . cataract surgery    . COLONOSCOPY    . CRANIOTOMY Right    right anterior temporal resection  . defibrillator replaced  March 2016  . IMPLANTATION VAGAL NERVE STIMULATOR  June 2016  . NASAL SINUS SURGERY    . TONSILLECTOMY     age 80  . VASECTOMY  1985    Allergies  Allergen Reactions  . Depakote [Divalproex Sodium] Other (See Comments)    Arthralgias     Outpatient Encounter Medications as of 07/06/2020  Medication Sig  . ALPRAZolam (XANAX) 0.25 MG tablet Take 1 tablet (0.25 mg total) by mouth 2 (two) times daily.  . brimonidine-timolol (COMBIGAN) 0.2-0.5 % ophthalmic solution Place 1 drop into both eyes every 12 (twelve) hours.   . cholecalciferol (VITAMIN D) 400 units TABS tablet Take 400 Units by mouth 2 (two) times daily.  . clobetasol  cream (TEMOVATE) 4.09 % Apply 1 application topically 2 (two) times daily as needed (For Flares).  . flecainide (TAMBOCOR) 50 MG tablet Take 50 mg by mouth 2 (two) times daily.  . fluocinonide ointment (LIDEX) 8.11 % Apply 1 application topically 2 (two) times daily as needed (on rash /skin folds, not for face).   Marland Kitchen ketoconazole (NIZORAL) 2 % shampoo Apply 1 application topically 2 (two) times a week. For seborrheic dermatitis on Monday and Thursday.  . Levetiracetam (KEPPRA XR) 750 MG TB24 Take 2,250 mg by mouth daily. Take three tabs daily at 6pm  . polyethylene glycol powder  (GLYCOLAX/MIRALAX) 17 GM/SCOOP powder Take by mouth.  . sodium chloride 1 g tablet Take 1 tablet (1 g total) by mouth 2 (two) times daily with a meal.  . tamsulosin (FLOMAX) 0.4 MG CAPS capsule Take 0.4 mg by mouth every morning.  . triamcinolone cream (KENALOG) 0.1 % Apply 1 application topically 2 (two) times daily as needed (for flares).   . warfarin (COUMADIN) 2.5 MG tablet Take 1 tablet (2.5 mg total) by mouth daily at 12 noon.  . finasteride (PROSCAR) 5 MG tablet Take 5 mg by mouth every morning.   . Lacosamide (VIMPAT) 150 MG TABS Take 1 tablet (150 mg total) by mouth 2 (two) times daily.  Marland Kitchen latanoprost (XALATAN) 0.005 % ophthalmic solution Place 1 drop into both eyes at bedtime.  Marland Kitchen LORazepam (ATIVAN) 2 MG/ML concentrated solution Give 2 mg IM for seizure.If continued seizure activity after 15 minutes, give 2 mg again as needed.  Marland Kitchen LORazepam (LORAZEPAM INTENSOL) 2 MG/ML concentrated solution Take 0.5 mLs (1 mg total) by mouth daily as needed (postictal anxiety).  . phenytoin (DILANTIN) 100 MG ER capsule Take 300 mg by mouth at bedtime. Special instruction: Dilantin 100 mg take 3 capsules (300) mg in the evening. Once an evening 06:30  . phenytoin (DILANTIN) 50 MG tablet Chew 75 mg by mouth daily. Chew 1.5 tablets (75 mg) by mouth daily one a morning (6:30) am  . polyethylene glycol (MIRALAX / GLYCOLAX) packet Take 17 g by mouth See admin instructions. Once daily every other day and as needed for constipation  . [DISCONTINUED] levETIRAcetam (KEPPRA XR PO) Take 750 mg by mouth at bedtime.  . [DISCONTINUED] Levetiracetam (KEPPRA XR) 750 MG TB24 Two tablets at 6 pm, 1 tablet at bedtime (Patient taking differently: 2,250 mg. three tablets at 6 pm)   No facility-administered encounter medications on file as of 07/06/2020.    Review of Systems  Constitutional: Negative for activity change, appetite change, chills, diaphoresis, fatigue, fever and unexpected weight change.  Skin: Positive for color  change, rash and wound.    Immunization History  Administered Date(s) Administered  . Influenza, High Dose Seasonal PF 04/07/2020  . Influenza, Seasonal, Injecte, Preservative Fre 02/22/2010  . Influenza,inj,Quad PF,6+ Mos 04/07/2018, 04/15/2019  . Influenza-Unspecified 03/17/2013, 02/24/2014, 02/16/2015, 03/21/2016, 03/17/2017, 04/07/2018  . Moderna Sars-Covid-2 Vaccination 06/28/2019, 07/27/2019, 05/01/2020  . Pneumococcal Conjugate-13 07/05/2013  . Pneumococcal Polysaccharide-23 06/17/2006  . Td 06/18/2007  . Tdap 12/07/2015  . Zoster 10/06/2008  . Zoster Recombinat (Shingrix) 12/04/2016   Pertinent  Health Maintenance Due  Topic Date Due  . INFLUENZA VACCINE  Completed  . PNA vac Low Risk Adult  Completed   Fall Risk  07/05/2020 03/15/2020 02/16/2020 11/17/2019 09/29/2019  Falls in the past year? 1 0 0 0 1  Number falls in past yr: 1 0 1 0 1  Injury with Fall? 1 0 1 0 0  Risk for fall due to : - - - - -  Follow up - - - - -  Comment - - - - -   Functional Status Survey:    Vitals:   07/06/20 1110  Temp: (!) 97 F (36.1 C)   There is no height or weight on file to calculate BMI. Physical Exam Vitals and nursing note reviewed.  Constitutional:      Appearance: Normal appearance.  Musculoskeletal:     Comments: +CMS of the RUE  Skin:    General: Skin is warm and dry.     Findings: Erythema present.     Comments: Right forearm with skin tear well approximated and covered with steri strips. Surrounding redness noted and warmth. Small amt of purulent matter at the skin tear wound line.    Neurological:     General: No focal deficit present.     Mental Status: He is alert and oriented to person, place, and time. Mental status is at baseline.  Psychiatric:        Mood and Affect: Mood normal.     Labs reviewed: Recent Labs    08/02/19 1128 11/11/19 0000 01/31/20 0000 02/21/20 0000 04/27/20 0000  NA 137   < > 139 128* 132*  K 5.1   < > 4.6 4.5 4.5  CL 102   < >  96* 94* 97*  CO2 17*   < > 30* 22 25*  GLUCOSE 84  --   --   --   --   BUN 14   < > 32* 12 15  CREATININE 0.81   < > 1.4* 0.7 0.8  CALCIUM 9.1   < > 10.4 9.2 9.0   < > = values in this interval not displayed.   Recent Labs    08/02/19 1128 01/03/20 0000  AST 31 16  ALT 16 12  ALKPHOS 156* 120  BILITOT <0.2  --   PROT 7.6  --   ALBUMIN 4.9* 3.9   Recent Labs    08/02/19 1128 01/03/20 0000  WBC 5.7 4.8  NEUTROABS 2.7  --   HGB 13.9 12.6*  HCT 38.9 36*  MCV 92  --   PLT 225 205   No results found for: TSH No results found for: HGBA1C Lab Results  Component Value Date   CHOL 212 (A) 01/03/2020   HDL 53 01/03/2020   LDLCALC 143 01/03/2020   TRIG 79 01/03/2020    Significant Diagnostic Results in last 30 days:  No results found.  Assessment/Plan  1. Infected skin tear Keflex 500 mg qid x 7 days  2. Current use of long term anticoagulation INR Monday 1/24 fingerstick

## 2020-07-12 DIAGNOSIS — L570 Actinic keratosis: Secondary | ICD-10-CM | POA: Diagnosis not present

## 2020-07-12 DIAGNOSIS — L111 Transient acantholytic dermatosis [Grover]: Secondary | ICD-10-CM | POA: Diagnosis not present

## 2020-07-12 DIAGNOSIS — L821 Other seborrheic keratosis: Secondary | ICD-10-CM | POA: Diagnosis not present

## 2020-07-12 DIAGNOSIS — Z85828 Personal history of other malignant neoplasm of skin: Secondary | ICD-10-CM | POA: Diagnosis not present

## 2020-07-14 ENCOUNTER — Encounter: Payer: Self-pay | Admitting: Internal Medicine

## 2020-07-19 DIAGNOSIS — E871 Hypo-osmolality and hyponatremia: Secondary | ICD-10-CM | POA: Diagnosis not present

## 2020-07-19 DIAGNOSIS — G40911 Epilepsy, unspecified, intractable, with status epilepticus: Secondary | ICD-10-CM | POA: Diagnosis not present

## 2020-07-19 DIAGNOSIS — G40909 Epilepsy, unspecified, not intractable, without status epilepticus: Secondary | ICD-10-CM | POA: Diagnosis not present

## 2020-07-19 LAB — BASIC METABOLIC PANEL
BUN: 15 (ref 4–21)
CO2: 24 — AB (ref 13–22)
Chloride: 100 (ref 99–108)
Creatinine: 0.8 (ref 0.6–1.3)
Glucose: 129
Potassium: 4.4 (ref 3.4–5.3)
Sodium: 134 — AB (ref 137–147)

## 2020-07-19 LAB — COMPREHENSIVE METABOLIC PANEL: Calcium: 8.5 — AB (ref 8.7–10.7)

## 2020-07-20 ENCOUNTER — Telehealth: Payer: Self-pay | Admitting: Neurology

## 2020-07-20 NOTE — Telephone Encounter (Signed)
I have received blood work results done on this patient on 19 July 2020.  Dilantin level is 19.3.  Glucose 129, calcium 8.5, creatinine of 0.81, BUN of 14.8, sodium 134, potassium 4.4, chloride 100, CO2 24, white blood count 4.2, hemoglobin 12.7, hematocrit 35.1, MCV of 92.1, platelets of 209.  Dilantin level is excellent, no recommendations for change of dosing to be made.

## 2020-08-04 ENCOUNTER — Other Ambulatory Visit: Payer: Self-pay

## 2020-08-04 MED ORDER — VIMPAT 150 MG PO TABS: 150.0000 mg | ORAL_TABLET | Freq: Two times a day (BID) | ORAL | 3 refills | Status: DC

## 2020-08-07 ENCOUNTER — Encounter: Payer: Self-pay | Admitting: Internal Medicine

## 2020-08-08 DIAGNOSIS — Z7901 Long term (current) use of anticoagulants: Secondary | ICD-10-CM | POA: Diagnosis not present

## 2020-08-08 DIAGNOSIS — G40911 Epilepsy, unspecified, intractable, with status epilepticus: Secondary | ICD-10-CM | POA: Diagnosis not present

## 2020-08-08 DIAGNOSIS — E871 Hypo-osmolality and hyponatremia: Secondary | ICD-10-CM | POA: Diagnosis not present

## 2020-08-08 LAB — BASIC METABOLIC PANEL
BUN: 11 (ref 4–21)
CO2: 24 — AB (ref 13–22)
Chloride: 101 (ref 99–108)
Creatinine: 0.9 (ref 0.6–1.3)
Glucose: 117
Potassium: 4.3 (ref 3.4–5.3)
Sodium: 135 — AB (ref 137–147)

## 2020-08-08 LAB — COMPREHENSIVE METABOLIC PANEL: Calcium: 8.6 — AB (ref 8.7–10.7)

## 2020-08-08 LAB — POCT INR: INR: 2.5 — AB (ref 0.9–1.1)

## 2020-08-08 LAB — PROTIME-INR: Protime: 25.2 — AB (ref 10.0–13.8)

## 2020-08-09 ENCOUNTER — Encounter: Payer: Self-pay | Admitting: *Deleted

## 2020-08-14 ENCOUNTER — Telehealth: Payer: Self-pay | Admitting: Neurology

## 2020-08-14 NOTE — Telephone Encounter (Signed)
I have received recent blood work on Cameron Potts, this was done on 08 August 2020.  Glucose 117, calcium 8.6, creatinine 0.87, BUN 10.5, sodium 135, potassium 4.3, chloride 101, CO2 24, the Dilantin level is 22.5.  INR is 2.53.  The patient sometimes is symptomatic with the Dilantin in this level.  I called the patient, left a message.  If he is feeling drowsy or staggery on his current dose of Dilantin, he is to contact our office and we will reduce the Dilantin slightly.  Our records indicate that he is getting a total of 375 mg of Dilantin daily.

## 2020-08-23 ENCOUNTER — Telehealth: Payer: Self-pay | Admitting: Nurse Practitioner

## 2020-08-23 NOTE — Telephone Encounter (Signed)
Called to schedule [name], patient refused saying he's caught up on his stuff and [name] handles everything.

## 2020-08-29 ENCOUNTER — Encounter: Payer: Self-pay | Admitting: Neurology

## 2020-08-29 ENCOUNTER — Ambulatory Visit (INDEPENDENT_AMBULATORY_CARE_PROVIDER_SITE_OTHER): Payer: Medicare Other | Admitting: Neurology

## 2020-08-29 VITALS — Ht 69.0 in | Wt 175.0 lb

## 2020-08-29 DIAGNOSIS — Z5181 Encounter for therapeutic drug level monitoring: Secondary | ICD-10-CM

## 2020-08-29 DIAGNOSIS — G40211 Localization-related (focal) (partial) symptomatic epilepsy and epileptic syndromes with complex partial seizures, intractable, with status epilepticus: Secondary | ICD-10-CM

## 2020-08-29 DIAGNOSIS — R269 Unspecified abnormalities of gait and mobility: Secondary | ICD-10-CM

## 2020-08-29 NOTE — Progress Notes (Signed)
Reason for visit: Seizures  Cameron Potts is an 85 y.o. male  History of present illness:  Cameron Potts is an 85 year old right-handed white male with a history of intractable seizures.  The patient has a vagal nerve stimulator in place, he has had prior epilepsy surgery.  He claims that the last brief seizure was 6 months ago, he remains on Dilantin, Vimpat, and Keppra.  His last Dilantin level on 14 August 2020 was 22.5.  The patient has not had any dramatic change in his level of drowsiness or his ability to walk.  He returns for further evaluation.  He walks with a walker, he denies any recent falls but has fallen in January 2022.  Past Medical History:  Diagnosis Date  . Abnormality of gait 12/21/2013  . Atrial fibrillation ([place])   . Blind left eye   . Carcinoma in situ of prostate   . Cardiac arrest ([place])   . Glaucoma   . Hypercholesteremia   . Hyperlipidemia   . Metabolic encephalopathy   . Mild left ventricular systolic dysfunction   . Presence of other specified functional implants   . PSA elevation   . Right frontal lobe lesion   . Seizures ([place])   . Tinea pedis   . 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 Urinary urgency     Past Surgical History:  Procedure Laterality Date  . CARDIAC DEFIBRILLATOR PLACEMENT    . cataract surgery    . COLONOSCOPY    . CRANIOTOMY Right    right anterior temporal resection  . defibrillator replaced  March 2016  . IMPLANTATION VAGAL NERVE STIMULATOR  June 2016  . NASAL SINUS SURGERY    . TONSILLECTOMY     age 89  . VASECTOMY  1985    Family History  Problem Relation Age of Onset  . Cancer - Lung Mother   . Cancer - Prostate Father     Social history:  reports that he has never smoked. He has never used smokeless tobacco. He reports previous alcohol use. He reports that he does not use drugs.    Allergies  Allergen Reactions  . Depakote [Divalproex Sodium] Other (See Comments)    Arthralgias      Medications:  Prior to Admission medications   Medication Sig Start Date End Date Taking? Authorizing Provider  ALPRAZolam (XANAX) 0.25 MG tablet Take 1 tablet (0.25 mg total) by mouth 2 (two) times daily. 04/04/20  Yes Reed, Tiffany L, DO  brimonidine-timolol (COMBIGAN) 0.2-0.5 % ophthalmic solution Place 1 drop into both eyes every 12 (twelve) hours.  01/05/18  Yes [provider]  cholecalciferol (VITAMIN D) 400 units TABS tablet Take 400 Units by mouth 2 (two) times daily.   Yes [provider]  clobetasol cream (TEMOVATE) 2.95 % Apply 1 application topically 2 (two) times daily as needed (For Flares).   Yes [provider]  finasteride (PROSCAR) 5 MG tablet Take 5 mg by mouth every morning.  02/12/19  Yes [provider]  flecainide (TAMBOCOR) 50 MG tablet Take 50 mg by mouth 2 (two) times daily.   Yes [provider]  fluocinonide ointment (LIDEX) 1.88 % Apply 1 application topically 2 (two) times daily as needed (on rash /skin folds, not for face).    Yes [provider]  ketoconazole (NIZORAL) 2 % shampoo Apply 1 application topically 2 (two) times a week. For seborrheic dermatitis on Monday and Thursday.   Yes [provider]  Lacosamide (VIMPAT) 150 MG TABS Take 1 tablet (150 mg total) by mouth 2 (two) times daily. 08/04/20  Yes Reed, Tiffany L, DO  latanoprost (XALATAN) 0.005 % ophthalmic solution Place 1 drop into both eyes at bedtime.   Yes [provider]  Levetiracetam 750 MG TB24 Take 2,250 mg by mouth daily. Take three tabs daily at 6pm   Yes [provider]  LORazepam (ATIVAN) 2 MG/ML concentrated solution Give 2 mg IM for seizure.If continued seizure activity after 15 minutes, give 2 mg again as needed. 10/02/19  Yes Reed, Tiffany L, DO  LORazepam (LORAZEPAM INTENSOL) 2 MG/ML concentrated solution Take 0.5 mLs (1 mg total) by mouth daily as needed (postictal anxiety). 09/29/19  Yes Reed, Tiffany L, DO   phenytoin (DILANTIN) 100 MG ER capsule Take 300 mg by mouth at bedtime. Special instruction: Dilantin 100 mg take 3 capsules (300) mg in the evening. Once an evening 06:30   Yes [provider]  phenytoin (DILANTIN) 50 MG tablet Chew 75 mg by mouth daily. Chew 1.5 tablets (75 mg) by mouth daily one a morning (6:30) am   Yes [provider]  polyethylene glycol (MIRALAX / GLYCOLAX) packet Take 17 g by mouth See admin instructions. Once daily every other day and as needed for constipation   Yes [provider]  polyethylene glycol powder (GLYCOLAX/MIRALAX) 17 GM/SCOOP powder Take by mouth.   Yes [provider]  sodium chloride 1 g tablet Take 1 tablet (1 g total) by mouth 2 (two) times daily with a meal. 02/01/19  Yes Vasireddy, Grier Mitts, MD  tamsulosin (FLOMAX) 0.4 MG CAPS capsule Take 0.4 mg by mouth every morning.   Yes [provider]  triamcinolone cream (KENALOG) 0.1 % Apply 1 application topically 2 (two) times daily as needed (for flares).    Yes [provider]  warfarin (COUMADIN) 2.5 MG tablet Take 1 tablet (2.5 mg total) by mouth daily at 12 noon. 02/12/19  Yes Reed, Tiffany L, DO    ROS:  Out of a complete 14 system review of symptoms, the patient complains only of the following symptoms, and all other reviewed systems are negative.  Walking difficulty Seizures  Height 5\' 9"  (1.753 m), weight 175 lb (79.4 kg).  Physical Exam  General: The patient is alert and cooperative at the time of the examination.  Skin: No significant peripheral edema is noted.   Neurologic Exam  Mental status: The patient is alert and oriented x 3 at the time of the examination. The patient has apparent normal recent and remote memory, with an apparently normal attention span and concentration ability.   Cranial nerves: Facial symmetry is present. Speech is normal, no aphasia or dysarthria is noted. Extraocular movements are full. Visual fields are  full.  Motor: The patient has good strength in all 4 extremities.  Sensory examination: Soft touch sensation is symmetric on the face, arms, and legs.  Coordination: The patient has good finger-nose-finger and heel-to-shin bilaterally.  Gait and station: The patient has a normal gait. Tandem gait is normal. Romberg is negative. No drift is seen.  Reflexes: Deep tendon reflexes are symmetric.   Assessment/Plan:  1.  Intractable seizures  2.  Gait disorder  3.  Hyponatremia  4.  Orthostatic hypotension  The patient recently had a fairly good sodium level of 135.  He may have mild SIADH on the Dilantin.  The patient will continue his current dose of the Dilantin, Vimpat, and Keppra, he claims he will  have another blood draw soon.  He does not appear to have any significant gait instability currently.  The patient will follow up in 6 months.  I attempted to access the vagal nerve stimulator to interrogated, the system would not connect with the vagal nerve stimulator.  Jill Alexanders MD 08/29/2020 11:32 AM  [facility] [address] [place], [place] [address]  Phone [phone] Fax [phone]

## 2020-08-31 ENCOUNTER — Telehealth: Payer: Self-pay | Admitting: Neurology

## 2020-08-31 NOTE — Telephone Encounter (Signed)
The patient had a recent seizure. We will get dilantin and vimpat and keppra levels.

## 2020-09-01 ENCOUNTER — Telehealth: Payer: Self-pay | Admitting: Neurology

## 2020-09-01 DIAGNOSIS — Z7901 Long term (current) use of anticoagulants: Secondary | ICD-10-CM | POA: Diagnosis not present

## 2020-09-01 DIAGNOSIS — I10 Essential (primary) hypertension: Secondary | ICD-10-CM | POA: Diagnosis not present

## 2020-09-01 NOTE — Telephone Encounter (Signed)
[facility]) called, Pt had a seizure 2 days ago, had two falls. Need to order of stat level for phenytoin (DILANTIN) 100 MG ER capsule.  Contact info: [phone]

## 2020-09-01 NOTE — Telephone Encounter (Signed)
I called and talk with the nurse, I will put in orders for checking Dilantin level, Vimpat level, Keppra level, CBC, and comprehensive metabolic profile.  The patient has a history of low sodium levels in the past.  The orders were faxed to [facility], fax number is [phone].

## 2020-09-02 DIAGNOSIS — R531 Weakness: Secondary | ICD-10-CM | POA: Diagnosis not present

## 2020-09-04 ENCOUNTER — Encounter: Payer: Self-pay | Admitting: Internal Medicine

## 2020-09-04 DIAGNOSIS — G40911 Epilepsy, unspecified, intractable, with status epilepticus: Secondary | ICD-10-CM | POA: Diagnosis not present

## 2020-09-04 DIAGNOSIS — M25552 Pain in left hip: Secondary | ICD-10-CM | POA: Diagnosis not present

## 2020-09-17 ENCOUNTER — Other Ambulatory Visit: Payer: Self-pay

## 2020-09-17 ENCOUNTER — Encounter (HOSPITAL_BASED_OUTPATIENT_CLINIC_OR_DEPARTMENT_OTHER): Payer: Self-pay | Admitting: Emergency Medicine

## 2020-09-17 ENCOUNTER — Emergency Department (HOSPITAL_BASED_OUTPATIENT_CLINIC_OR_DEPARTMENT_OTHER)
Admission: EM | Admit: 2020-09-17 | Discharge: 2020-09-17 | Disposition: A | Discharge status: Home / Self Care | Payer: Medicare Other | Attending: Emergency Medicine | Admitting: Emergency Medicine

## 2020-09-17 DIAGNOSIS — Z8546 Personal history of malignant neoplasm of prostate: Secondary | ICD-10-CM | POA: Diagnosis not present

## 2020-09-17 DIAGNOSIS — Z7901 Long term (current) use of anticoagulants: Secondary | ICD-10-CM | POA: Insufficient documentation

## 2020-09-17 DIAGNOSIS — G903 Multi-system degeneration of the autonomic nervous system: Secondary | ICD-10-CM | POA: Diagnosis not present

## 2020-09-17 DIAGNOSIS — S41112A Laceration without foreign body of left upper arm, initial encounter: Secondary | ICD-10-CM

## 2020-09-17 DIAGNOSIS — I482 Chronic atrial fibrillation, unspecified: Secondary | ICD-10-CM | POA: Diagnosis not present

## 2020-09-17 DIAGNOSIS — S59912A Unspecified injury of left forearm, initial encounter: Secondary | ICD-10-CM | POA: Diagnosis present

## 2020-09-17 DIAGNOSIS — W010XXA Fall on same level from slipping, tripping and stumbling without subsequent striking against object, initial encounter: Secondary | ICD-10-CM | POA: Diagnosis not present

## 2020-09-17 DIAGNOSIS — Z23 Encounter for immunization: Secondary | ICD-10-CM | POA: Diagnosis not present

## 2020-09-17 DIAGNOSIS — S51812A Laceration without foreign body of left forearm, initial encounter: Secondary | ICD-10-CM | POA: Insufficient documentation

## 2020-09-17 DIAGNOSIS — F0390 Unspecified dementia without behavioral disturbance: Secondary | ICD-10-CM | POA: Insufficient documentation

## 2020-09-17 MED ORDER — CEPHALEXIN 500 MG PO CAPS: 500.0000 mg | ORAL_CAPSULE | Freq: Four times a day (QID) | ORAL | 0 refills | Status: DC

## 2020-09-17 MED ORDER — TETANUS-DIPHTH-ACELL PERTUSSIS 5-2.5-18.5 LF-MCG/0.5 IM SUSY
0.5000 mL | PREFILLED_SYRINGE | Freq: Once | INTRAMUSCULAR | Status: AC
  Administered 2020-09-17: 0.5 mL via INTRAMUSCULAR
  Filled 2020-09-17: qty 0.5

## 2020-09-17 MED ORDER — LIDOCAINE-EPINEPHRINE (PF) 2 %-1:200000 IJ SOLN
10.0000 mL | Freq: Once | INTRAMUSCULAR | Status: AC
  Administered 2020-09-17: 10 mL via INTRADERMAL
  Filled 2020-09-17: qty 20

## 2020-09-17 NOTE — ED Provider Notes (Signed)
[facility] EMERGENCY DEPT Provider Note   CSN: [id number] Arrival date & time: 09/17/20  1700     History Chief Complaint  Patient presents with  . [place] III is a 85 y.o. male.  HPI   85 year old male past medical history of dementia, seizure disorder, A. fib on Coumadin presents to the emergency department with mechanical fall.  Patient states he was standing with his walker, went to throw the dish towel, lost his balance and fell down onto his left buttocks.  He hit his left arm on the walker and sustained a laceration.  Did not hit his head, no loss of consciousness, no syncope.  States he is otherwise been in his usual state of health.  Currently denies any head pain, neck/spine pain, hip/extremity pain.  Past Medical History:  Diagnosis Date  . Abnormality of gait 12/21/2013  . Atrial fibrillation ([place])   . Blind left eye   . Carcinoma in situ of prostate   . Cardiac arrest ([place])   . Glaucoma   . Hypercholesteremia   . Hyperlipidemia   . Metabolic encephalopathy   . Mild left ventricular systolic dysfunction   . Presence of other specified functional implants   . PSA elevation   . Right frontal lobe lesion   . Seizures ([place])   . Tinea pedis   . 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 Urinary urgency     Patient Active Problem List   Diagnosis Date Noted  . Benign prostatic hyperplasia with urinary frequency 07/05/2020  . Breakthrough seizure ([place]) 01/30/2019  . Intractable seizures ([place]) 01/30/2019  . Altered mental status 11/02/2018  . Hypercoagulability due to atrial fibrillation ([place]) 03/25/2018  . Primary open-angle glaucoma, bilateral, severe stage 03/17/2018  . ICD (implantable cardioverter-defibrillator) in place 03/13/2018  . Parkinsonian syndrome associated with symptomatic orthostatic hypotension ([place]) 03/13/2018  . Refractory epilepsy ([place]) 03/13/2018  . Status post VNS (vagus nerve stimulator) placement  03/13/2018  . 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 Neuropathy, peripheral 12/14/2017  . Hyponatremia 07/12/2017  . Presence of intraocular lens 04/23/2017  . Status epilepticus ([place]) 01/05/2017  . Chronic atrial fibrillation 01/04/2017  . Adjustment disorder with anxious mood 03/25/2014  . Age-related nuclear cataract of both eyes 03/25/2014  . 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  . Hyperlipidemia 03/25/2014  . Carcinoma in situ of prostate 02/08/2014  . Other localized visual field defect, bilateral 02/02/2014  . Abnormality of gait 12/21/2013  . Complex partial seizure ([place]) 04/11/2013  . Seizure disorder ([place]) 04/10/2013  . Osteopenia 12/04/2011  . Vitamin D deficiency 12/04/2011    Past Surgical History:  Procedure Laterality Date  . CARDIAC DEFIBRILLATOR PLACEMENT    . cataract surgery    . COLONOSCOPY    . CRANIOTOMY Right    right anterior temporal resection  . defibrillator replaced  March 2016  . IMPLANTATION VAGAL NERVE STIMULATOR  June 2016  . NASAL SINUS SURGERY    . TONSILLECTOMY     age 55  . VASECTOMY  1985       Family History  Problem Relation Age of Onset  . Cancer - Lung Mother   . Cancer - Prostate Father     Social History   Tobacco Use  . Smoking status: Never Smoker  . Smokeless tobacco: Never Used  . Tobacco comment: quit 1960s  Vaping Use  . Vaping Use: Never used  Substance Use Topics  . Alcohol use: Not Currently  Comment: former beer drinker  . Drug use: No    Home Medications Prior to Admission medications   Medication Sig Start Date End Date Taking? Authorizing Provider  cephALEXin (KEFLEX) 500 MG capsule Take 1 capsule (500 mg total) by mouth 4 (four) times daily. 09/17/20  Yes Chyrel Taha, Alvin Critchley, DO  ALPRAZolam (XANAX) 0.25 MG tablet Take 1 tablet (0.25 mg total) by mouth 2 (two) times daily. 04/04/20   Reed, Tiffany L, DO  brimonidine-timolol (COMBIGAN) 0.2-0.5 % ophthalmic solution Place 1 drop into both eyes every  12 (twelve) hours.  01/05/18   [provider]  cholecalciferol (VITAMIN D) 400 units TABS tablet Take 400 Units by mouth 2 (two) times daily.    [provider]  clobetasol cream (TEMOVATE) 2.75 % Apply 1 application topically 2 (two) times daily as needed (For Flares).    [provider]  finasteride (PROSCAR) 5 MG tablet Take 5 mg by mouth every morning.  02/12/19   [provider]  flecainide (TAMBOCOR) 50 MG tablet Take 50 mg by mouth 2 (two) times daily.    [provider]  fluocinonide ointment (LIDEX) 1.70 % Apply 1 application topically 2 (two) times daily as needed (on rash /skin folds, not for face).     [provider]  ketoconazole (NIZORAL) 2 % shampoo Apply 1 application topically 2 (two) times a week. For seborrheic dermatitis on Monday and Thursday.    [provider]  Lacosamide (VIMPAT) 150 MG TABS Take 1 tablet (150 mg total) by mouth 2 (two) times daily. 08/04/20   Reed, Tiffany L, DO  latanoprost (XALATAN) 0.005 % ophthalmic solution Place 1 drop into both eyes at bedtime.    [provider]  Levetiracetam 750 MG TB24 Take 2,250 mg by mouth daily. Take three tabs daily at 6pm    [provider]  LORazepam (ATIVAN) 2 MG/ML concentrated solution Give 2 mg IM for seizure.If continued seizure activity after 15 minutes, give 2 mg again as needed. 10/02/19   Reed, Tiffany L, DO  LORazepam (LORAZEPAM INTENSOL) 2 MG/ML concentrated solution Take 0.5 mLs (1 mg total) by mouth daily as needed (postictal anxiety). 09/29/19   Reed, Tiffany L, DO  phenytoin (DILANTIN) 100 MG ER capsule Take 300 mg by mouth at bedtime. Special instruction: Dilantin 100 mg take 3 capsules (300) mg in the evening. Once an evening 06:30    [provider]  phenytoin (DILANTIN) 50 MG tablet Chew 75 mg by mouth daily. Chew 1.5 tablets (75 mg) by mouth daily one a morning (6:30) am    [provider]  polyethylene glycol  (MIRALAX / GLYCOLAX) packet Take 17 g by mouth See admin instructions. Once daily every other day and as needed for constipation    [provider]  polyethylene glycol powder (GLYCOLAX/MIRALAX) 17 GM/SCOOP powder Take by mouth.    [provider]  sodium chloride 1 g tablet Take 1 tablet (1 g total) by mouth 2 (two) times daily with a meal. 02/01/19   Monica Becton, MD  tamsulosin (FLOMAX) 0.4 MG CAPS capsule Take 0.4 mg by mouth every morning.    [provider]  triamcinolone cream (KENALOG) 0.1 % Apply 1 application topically 2 (two) times daily as needed (for flares).     [provider]  warfarin (COUMADIN) 2.5 MG tablet Take 1 tablet (2.5 mg total) by mouth daily at 12 noon. 02/12/19   Reed, Tiffany L, DO    Allergies  Depakote [divalproex sodium]  Review of Systems   Review of Systems  Constitutional: Negative for chills and fever.  HENT: Negative for congestion.   Eyes: Negative for visual disturbance.  Respiratory: Negative for shortness of breath.   Cardiovascular: Negative for chest pain.  Gastrointestinal: Negative for abdominal pain.  Genitourinary: Negative for hematuria.  Musculoskeletal: Negative for back pain, joint swelling and neck pain.  Skin: Positive for wound.  Neurological: Negative for seizures, syncope and headaches.    Physical Exam Updated Vital Signs BP (!) 120/54 (BP Location: Right Arm)   Pulse 60   Temp 97.7 F (36.5 C) (Oral)   Resp 16   Ht 5\' 9"  (1.753 m)   Wt 79.4 kg   SpO2 98%   BMI 25.84 kg/m   Physical Exam Vitals and nursing note reviewed.  Constitutional:      Appearance: Normal appearance.  HENT:     Head: Normocephalic.     Mouth/Throat:     Mouth: Mucous membranes are moist.  Eyes:     Pupils: Pupils are equal, round, and reactive to light.  Cardiovascular:     Rate and Rhythm: Normal rate.  Pulmonary:     Effort: Pulmonary effort is normal. No respiratory distress.  Abdominal:      Palpations: Abdomen is soft.     Tenderness: There is no abdominal tenderness.  Musculoskeletal:        General: No tenderness or deformity.     Cervical back: No rigidity or tenderness.     Comments: Large skin tear to the left forearm with a laceration, bleeding controlled, left upper extremity is otherwise neurovascularly intact, no tenderness to palpation of the bones  Skin:    General: Skin is warm.  Neurological:     Mental Status: He is alert and oriented to person, place, and time. Mental status is at baseline.  Psychiatric:        Mood and Affect: Mood normal.     ED Results / Procedures / Treatments   Labs (all labs ordered are listed, but only abnormal results are displayed) Labs Reviewed - No data to display  EKG None  Radiology No results found.  Procedures .Marland KitchenLaceration Repair  Date/Time: 09/17/2020 9:34 AM Performed by: Lorelle Gibbs, DO Authorized by: Lorelle Gibbs, DO   Consent:    Consent obtained:  Verbal   Consent given by:  Patient   Risks discussed:  Infection, poor cosmetic result and poor wound healing   Alternatives discussed:  No treatment Universal protocol:    Patient identity confirmed:  Verbally with patient Anesthesia:    Anesthesia method:  Local infiltration   Local anesthetic:  Lidocaine 2% WITH epi Laceration details:    Location:  Shoulder/arm   Shoulder/arm location:  L lower arm   Length (cm):  4   Depth (mm):  20 Pre-procedure details:    Preparation:  Patient was prepped and draped in usual sterile fashion Exploration:    Wound exploration: wound explored through full range of motion and entire depth of wound visualized     Contaminated: no   Treatment:    Area cleansed with:  Saline   Amount of cleaning:  Extensive   Irrigation solution:  Sterile saline   Irrigation method:  Syringe   Visualized foreign bodies/material removed: no     Debridement:  Minimal   Layers/structures repaired:  Deep subcutaneous Deep  subcutaneous:    Suture size:  4-0   Suture material:  Vicryl  Suture technique:  Simple interrupted   Number of sutures:  1 Skin repair:    Repair method:  Sutures   Suture size:  3-0   Suture material:  Prolene   Suture technique:  Simple interrupted   Number of sutures:  3 Approximation:    Approximation:  Close Repair type:    Repair type:  Complex Post-procedure details:    Dressing:  Antibiotic ointment and non-adherent dressing   Procedure completion:  Tolerated     Medications Ordered in ED Medications  lidocaine-EPINEPHrine (XYLOCAINE W/EPI) 2 %-1:200000 (PF) injection 10 mL (10 mLs Intradermal Given 09/17/20 0905)  Tdap (BOOSTRIX) injection 0.5 mL (0.5 mLs Intramuscular Given 09/17/20 5465)    ED Course  I have reviewed the triage vital signs and the nursing notes.  Pertinent labs & imaging results that were available during my care of the patient were reviewed by me and considered in my medical decision making (see chart for details).    MDM Rules/Calculators/A&P                          85 year old male presents emergency department with mechanical fall and left forearm skin tear/laceration.  He is accompanied by his daughter, said to be baseline.  Patient has dementia but he is alert and oriented, reliable historian.  Denying any syncope, seizure, head injury, headache.  His only complaint is the left forearm wound.  There is a large skin tear that goes into an approximate 4 cm laceration that tracks deep.  Wound was irrigated and repaired, did require a deep layer suture, please see laceration note.  Due to the depth and extent of the wound we will place him on prophylactic antibiotics.  Patient denies any other symptoms, do not feel any emergent lab/imaging is warranted at this time.  [place] updated.  Patient will be discharged and treated as an outpatient.  Discharge plan and strict return to ED precautions discussed, patient verbalizes understanding and  agreement.  Final Clinical Impression(s) / ED Diagnoses Final diagnoses:  Laceration of left upper extremity, initial encounter  Skin tear of left forearm without complication, initial encounter    Rx / DC Orders ED Discharge Orders         Ordered    cephALEXin (KEFLEX) 500 MG capsule  4 times daily        09/17/20 Kaka, Alvin Critchley, DO 09/17/20 0354

## 2020-09-17 NOTE — ED Triage Notes (Signed)
Pt brought in by family for fall from standing position. Pt reports that he threw a towel while using a walker. Pt denies dizziness. Pt presents with laceration to left forearm.

## 2020-09-17 NOTE — Discharge Instructions (Addendum)
You have been seen and discharged from the emergency department.  Your laceration was repaired with sutures.  There was one deep suture that will dissolve.  There are 3 sutures on top that need to be removed in about 7 days.  Take antibiotics as directed.  Keep the wound dressed and dry for the next 24 hours.  After that you may let soapy water run over the area, pat to dry.  Follow-up with your primary provider for reevaluation and further care. Take home medications as prescribed. If you have any worsening symptoms, arm swelling, arm redness, fevers or signs of infection or further concerns for health please return to an emergency department for further evaluation.

## 2020-09-17 NOTE — ED Notes (Signed)
Pt's family member asked that pt remain in wheelchair, due to pt being unsteady. FALL RISK band placed on pt's wrist.

## 2020-09-26 ENCOUNTER — Non-Acute Institutional Stay: Payer: Medicare Other | Admitting: Orthopedic Surgery

## 2020-09-26 DIAGNOSIS — S51819D Laceration without foreign body of unspecified forearm, subsequent encounter: Secondary | ICD-10-CM

## 2020-09-27 ENCOUNTER — Encounter: Payer: Self-pay | Admitting: Orthopedic Surgery

## 2020-09-27 NOTE — Progress Notes (Signed)
Location:  [facility] Room Number: 588 Place of Service:  ALF [phone]) Provider: Windell Moulding, AGNP-C  [name], MD  Patient Care Team: [name], MD as PCP - General (Internal Medicine) Rutherford Guys, MD as Consulting Physician (Ophthalmology) Kathrynn Ducking, MD as Consulting Physician (Neurology) Gayland Curry, DO (Geriatric Medicine)  Extended Emergency Contact Information Primary Emergency Contact: Benthall,[name] Address: [address] [place] [address] Johnnette Litter of [place] Phone: [phone] Mobile Phone: [phone] Relation: Spouse Secondary Emergency Contact: Boger,Stancil Mobile Phone: [phone] Relation: Son  Goals of care: Advanced Directive information Advanced Directives 09/17/2020  Does Patient Have a Medical Advance Directive? Yes  Type of Advance Directive (No Data)  Does patient want to make changes to medical advance directive? -  Copy of [place] in Chart? -  Would patient like information on creating a medical advance directive? -  Pre-existing out of facility DNR order (yellow form or pink MOST form) -     Chief Complaint  Patient presents with  . Acute Visit    Non-healing wound    HPI:  Pt is a 85 y.o. male seen today for acute visit for non-healing wound.   04/03 he fell and developed a laceration to left upper extremity. He received 3 sutures in the ED and advised to to cover with triple antibiotic ointment and non-adherent dressing. Remove sutures in 7 days.   04/12 nursing staff reports his would is not closed, sutures scheduled to be removed today. No sign of infection noted. He denies increased pain, fever, or purulent drainage.     Past Medical History:  Diagnosis Date  . Abnormality of gait 12/21/2013  . Atrial fibrillation ([place])   . Blind left eye   . Carcinoma in situ of prostate   . Cardiac arrest ([place])   . Glaucoma   . Hypercholesteremia    . Hyperlipidemia   . Metabolic encephalopathy   . Mild left ventricular systolic dysfunction   . Presence of other specified functional implants   . PSA elevation   . Right frontal lobe lesion   . Seizures ([place])   . Tinea pedis   . 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 Urinary urgency    Past Surgical History:  Procedure Laterality Date  . CARDIAC DEFIBRILLATOR PLACEMENT    . cataract surgery    . COLONOSCOPY    . CRANIOTOMY Right    right anterior temporal resection  . defibrillator replaced  March 2016  . IMPLANTATION VAGAL NERVE STIMULATOR  June 2016  . NASAL SINUS SURGERY    . TONSILLECTOMY     age 41  . VASECTOMY  1985    Allergies  Allergen Reactions  . Depakote [Divalproex Sodium] Other (See Comments)    Arthralgias     Outpatient Encounter Medications as of 09/26/2020  Medication Sig  . ALPRAZolam (XANAX) 0.25 MG tablet Take 1 tablet (0.25 mg total) by mouth 2 (two) times daily.  . brimonidine-timolol (COMBIGAN) 0.2-0.5 % ophthalmic solution Place 1 drop into both eyes every 12 (twelve) hours.   . cephALEXin (KEFLEX) 500 MG capsule Take 1 capsule (500 mg total) by mouth 4 (four) times daily.  . cholecalciferol (VITAMIN D) 400 units TABS tablet Take 400 Units by mouth 2 (two) times daily.  . clobetasol cream (TEMOVATE) 6.29 % Apply 1 application topically 2 (two) times daily as needed (For Flares).  Marland Kitchen  finasteride (PROSCAR) 5 MG tablet Take 5 mg by mouth every morning.   . flecainide (TAMBOCOR) 50 MG tablet Take 50 mg by mouth 2 (two) times daily.  . fluocinonide ointment (LIDEX) 5.46 % Apply 1 application topically 2 (two) times daily as needed (on rash /skin folds, not for face).   Marland Kitchen ketoconazole (NIZORAL) 2 % shampoo Apply 1 application topically 2 (two) times a week. For seborrheic dermatitis on Monday and Thursday.  . Lacosamide (VIMPAT) 150 MG TABS Take 1 tablet (150 mg total) by mouth 2 (two) times daily.  Marland Kitchen latanoprost (XALATAN) 0.005 % ophthalmic  solution Place 1 drop into both eyes at bedtime.  . Levetiracetam 750 MG TB24 Take 2,250 mg by mouth daily. Take three tabs daily at 6pm  . LORazepam (ATIVAN) 2 MG/ML concentrated solution Give 2 mg IM for seizure.If continued seizure activity after 15 minutes, give 2 mg again as needed.  Marland Kitchen LORazepam (LORAZEPAM INTENSOL) 2 MG/ML concentrated solution Take 0.5 mLs (1 mg total) by mouth daily as needed (postictal anxiety).  . phenytoin (DILANTIN) 100 MG ER capsule Take 300 mg by mouth at bedtime. Special instruction: Dilantin 100 mg take 3 capsules (300) mg in the evening. Once an evening 06:30  . phenytoin (DILANTIN) 50 MG tablet Chew 75 mg by mouth daily. Chew 1.5 tablets (75 mg) by mouth daily one a morning (6:30) am  . polyethylene glycol (MIRALAX / GLYCOLAX) packet Take 17 g by mouth See admin instructions. Once daily every other day and as needed for constipation  . polyethylene glycol powder (GLYCOLAX/MIRALAX) 17 GM/SCOOP powder Take by mouth.  . sodium chloride 1 g tablet Take 1 tablet (1 g total) by mouth 2 (two) times daily with a meal.  . tamsulosin (FLOMAX) 0.4 MG CAPS capsule Take 0.4 mg by mouth every morning.  . triamcinolone cream (KENALOG) 0.1 % Apply 1 application topically 2 (two) times daily as needed (for flares).   . warfarin (COUMADIN) 2.5 MG tablet Take 1 tablet (2.5 mg total) by mouth daily at 12 noon.   No facility-administered encounter medications on file as of 09/26/2020.    Review of Systems  Constitutional: Negative for activity change, appetite change, fatigue and fever.  Respiratory: Negative for cough and wheezing.   Cardiovascular: Negative for chest pain and leg swelling.  Skin:       Left skin laceration with sutures  Psychiatric/Behavioral: Positive for confusion. Negative for dysphoric mood. The patient is not nervous/anxious.     Immunization History  Administered Date(s) Administered  . Influenza, High Dose Seasonal PF 04/07/2020  . Influenza,  Seasonal, Injecte, Preservative Fre 02/22/2010  . Influenza,inj,Quad PF,6+ Mos 04/07/2018, 04/15/2019  . Influenza-Unspecified 03/17/2013, 02/24/2014, 02/16/2015, 03/21/2016, 03/17/2017, 04/07/2018  . Moderna Sars-Covid-2 Vaccination 06/28/2019, 07/27/2019, 05/01/2020  . Pneumococcal Conjugate-13 07/05/2013  . Pneumococcal Polysaccharide-23 06/17/2006  . Td 06/18/2007  . Tdap 12/07/2015, 09/17/2020  . Zoster 10/06/2008  . Zoster Recombinat (Shingrix) 12/04/2016   Pertinent  Health Maintenance Due  Topic Date Due  . INFLUENZA VACCINE  01/15/2021  . PNA vac Low Risk Adult  Completed   Fall Risk  07/05/2020 03/15/2020 02/16/2020 11/17/2019 09/29/2019  Falls in the past year? 1 0 0 0 1  Number falls in past yr: 1 0 1 0 1  Injury with Fall? 1 0 1 0 0  Risk for fall due to : - - - - -  Follow up - - - - -  Comment - - - - -   Functional  Status Survey:    Vitals:   09/27/20 1011  BP: (!) 146/80  Pulse: 67  Resp: 18  Temp: (!) 97.3 F (36.3 C)  SpO2: 97%  Weight: 175 lb 9.6 oz (79.7 kg)   Body mass index is 25.93 kg/m. Physical Exam Vitals reviewed.  Constitutional:      General: He is not in acute distress. Cardiovascular:     Rate and Rhythm: Normal rate. Rhythm irregular.     Pulses: Normal pulses.     Heart sounds: Normal heart sounds. No murmur heard.   Pulmonary:     Effort: Pulmonary effort is normal. No respiratory distress.     Breath sounds: Normal breath sounds. No wheezing.  Skin:    General: Skin is warm and dry.     Capillary Refill: Capillary refill takes less than 2 seconds.     Findings: Lesion present.     Comments: Laceration to left extremity, CDI, 3 sutures in place. Mild non approximation to proximal side. No drainage. Skin tear to left extremity CDI, quarter size, serosanguinous drainage present.   Neurological:     General: No focal deficit present.     Mental Status: He is alert. Mental status is at baseline.  Psychiatric:        Mood and Affect:  Mood normal.        Behavior: Behavior normal.     Labs reviewed: Recent Labs    04/27/20 0000 07/19/20 0000 08/08/20 0000  NA 132* 134* 135*  K 4.5 4.4 4.3  CL 97* 100 101  CO2 25* 24* 24*  BUN 15 15 11   CREATININE 0.8 0.8 0.9  CALCIUM 9.0 8.5* 8.6*   Recent Labs    01/03/20 0000  AST 16  ALT 12  ALKPHOS 120  ALBUMIN 3.9   Recent Labs    01/03/20 0000  WBC 4.8  HGB 12.6*  HCT 36*  PLT 205   No results found for: TSH No results found for: HGBA1C Lab Results  Component Value Date   CHOL 212 (A) 01/03/2020   HDL 53 01/03/2020   LDLCALC 143 01/03/2020   TRIG 79 01/03/2020    Significant Diagnostic Results in last 30 days:  No results found.  Assessment/Plan 1. Laceration of skin of forearm, subsequent encounter - edges almost approximated, sutures intact, no sign of infection - will extend sutures for 2 more days, then may discontinue - cont daily dressing changes with triple antibiotic and non-adherent dressing   Family/ staff Communication: plan discussed with patient and nurse  Labs/tests ordered: none

## 2020-10-04 ENCOUNTER — Other Ambulatory Visit: Payer: Self-pay | Admitting: Orthopedic Surgery

## 2020-10-04 DIAGNOSIS — R451 Restlessness and agitation: Secondary | ICD-10-CM

## 2020-10-04 MED ORDER — ALPRAZOLAM 0.25 MG PO TABS: 0.2500 mg | ORAL_TABLET | Freq: Two times a day (BID) | ORAL | 5 refills | Status: DC

## 2020-10-25 DIAGNOSIS — D692 Other nonthrombocytopenic purpura: Secondary | ICD-10-CM | POA: Diagnosis not present

## 2020-10-25 DIAGNOSIS — Z85820 Personal history of malignant melanoma of skin: Secondary | ICD-10-CM | POA: Diagnosis not present

## 2020-10-25 DIAGNOSIS — L308 Other specified dermatitis: Secondary | ICD-10-CM | POA: Diagnosis not present

## 2020-10-25 DIAGNOSIS — L821 Other seborrheic keratosis: Secondary | ICD-10-CM | POA: Diagnosis not present

## 2020-10-25 DIAGNOSIS — L570 Actinic keratosis: Secondary | ICD-10-CM | POA: Diagnosis not present

## 2020-10-30 ENCOUNTER — Other Ambulatory Visit: Payer: Self-pay | Admitting: Adult Health

## 2020-10-30 DIAGNOSIS — G40919 Epilepsy, unspecified, intractable, without status epilepticus: Secondary | ICD-10-CM

## 2020-10-30 DIAGNOSIS — R441 Visual hallucinations: Secondary | ICD-10-CM

## 2020-10-30 DIAGNOSIS — G40909 Epilepsy, unspecified, not intractable, without status epilepticus: Secondary | ICD-10-CM

## 2020-10-30 MED ORDER — LORAZEPAM 2 MG/ML PO CONC: 1.0000 mg | Freq: Every day | ORAL | 0 refills | Status: DC | PRN

## 2020-10-30 MED ORDER — LORAZEPAM 2 MG/ML PO CONC
ORAL | 0 refills | Status: DC
Start: 2020-10-30 — End: 2021-07-09

## 2020-11-21 DIAGNOSIS — G4089 Other seizures: Secondary | ICD-10-CM | POA: Diagnosis not present

## 2020-11-21 DIAGNOSIS — Z7901 Long term (current) use of anticoagulants: Secondary | ICD-10-CM | POA: Diagnosis not present

## 2020-11-21 LAB — BASIC METABOLIC PANEL
BUN: 12 (ref 4–21)
CO2: 26 — AB (ref 13–22)
Chloride: 100 (ref 99–108)
Creatinine: 0.8 (ref 0.6–1.3)
Glucose: 130
Potassium: 4.8 (ref 3.4–5.3)
Sodium: 133 — AB (ref 137–147)

## 2020-11-21 LAB — COMPREHENSIVE METABOLIC PANEL: Calcium: 9.1 (ref 8.7–10.7)

## 2020-12-20 ENCOUNTER — Telehealth: Payer: Self-pay | Admitting: Neurology

## 2020-12-20 NOTE — Telephone Encounter (Signed)
Contacted pt back, informed him that we did not prescribe XANAX to him, he was addiment that we did and that is not what he called about he called about alprazolam. I informed him that these are the same medications. Advised to contact the provider that gave him this to see if discontinuation in necessary. He states Cameron Potts is his only doctor there is no other doctor or pcp for him. I informed him I will forward the concern to him, he states that is ok he will address it at next visit.

## 2020-12-20 NOTE — Telephone Encounter (Signed)
Pt would like to know if he still needs to take ALPRAZolam (XANAX) 0.25 MG tablet, please call

## 2021-01-05 DIAGNOSIS — R350 Frequency of micturition: Secondary | ICD-10-CM | POA: Diagnosis not present

## 2021-01-05 DIAGNOSIS — R351 Nocturia: Secondary | ICD-10-CM | POA: Diagnosis not present

## 2021-01-05 DIAGNOSIS — N401 Enlarged prostate with lower urinary tract symptoms: Secondary | ICD-10-CM | POA: Diagnosis not present

## 2021-01-08 DIAGNOSIS — Z7901 Long term (current) use of anticoagulants: Secondary | ICD-10-CM | POA: Diagnosis not present

## 2021-01-08 DIAGNOSIS — G40911 Epilepsy, unspecified, intractable, with status epilepticus: Secondary | ICD-10-CM | POA: Diagnosis not present

## 2021-01-08 DIAGNOSIS — E871 Hypo-osmolality and hyponatremia: Secondary | ICD-10-CM | POA: Diagnosis not present

## 2021-01-08 LAB — BASIC METABOLIC PANEL
BUN: 11 (ref 4–21)
CO2: 19 (ref 13–22)
Chloride: 95 — AB (ref 99–108)
Creatinine: 0.8 (ref 0.6–1.3)
Glucose: 53
Potassium: 5.2 (ref 3.4–5.3)
Sodium: 129 — AB (ref 137–147)

## 2021-01-08 LAB — COMPREHENSIVE METABOLIC PANEL: Calcium: 9.1 (ref 8.7–10.7)

## 2021-01-11 ENCOUNTER — Telehealth: Payer: Self-pay | Admitting: *Deleted

## 2021-01-11 DIAGNOSIS — Z23 Encounter for immunization: Secondary | ICD-10-CM | POA: Diagnosis not present

## 2021-01-11 NOTE — Telephone Encounter (Addendum)
Received lab results for [facility] re: Dilantin and Na levels.  Dilantin 15.7.  Na 129.  Labs results placed in work in MD, Dr Clydene Fake inbox for review, reply. Labs reviewed by Dr Leonie Man and form completed, signed, faxed back to [place]. Received confirmation.

## 2021-01-17 ENCOUNTER — Other Ambulatory Visit: Payer: Self-pay

## 2021-01-17 ENCOUNTER — Non-Acute Institutional Stay: Payer: Medicare Other | Admitting: Internal Medicine

## 2021-01-17 ENCOUNTER — Encounter: Payer: Self-pay | Admitting: Internal Medicine

## 2021-01-17 VITALS — BP 126/74 | HR 85 | Temp 96.4°F | Ht 69.0 in | Wt 177.6 lb

## 2021-01-17 DIAGNOSIS — R350 Frequency of micturition: Secondary | ICD-10-CM

## 2021-01-17 DIAGNOSIS — G20 Parkinson's disease: Secondary | ICD-10-CM | POA: Diagnosis not present

## 2021-01-17 DIAGNOSIS — Z95810 Presence of automatic (implantable) cardiac defibrillator: Secondary | ICD-10-CM | POA: Diagnosis not present

## 2021-01-17 DIAGNOSIS — I951 Orthostatic hypotension: Secondary | ICD-10-CM | POA: Diagnosis not present

## 2021-01-17 DIAGNOSIS — N401 Enlarged prostate with lower urinary tract symptoms: Secondary | ICD-10-CM

## 2021-01-17 DIAGNOSIS — E871 Hypo-osmolality and hyponatremia: Secondary | ICD-10-CM | POA: Diagnosis not present

## 2021-01-17 DIAGNOSIS — I482 Chronic atrial fibrillation, unspecified: Secondary | ICD-10-CM

## 2021-01-17 DIAGNOSIS — R296 Repeated falls: Secondary | ICD-10-CM

## 2021-01-17 DIAGNOSIS — G40919 Epilepsy, unspecified, intractable, without status epilepticus: Secondary | ICD-10-CM

## 2021-01-17 NOTE — Progress Notes (Signed)
Location:  [place] of Service:  Clinic (12)  Provider:   Code Status: DNR Goals of Care:  Advanced Directives 01/17/2021  Does Patient Have a Medical Advance Directive? Yes  Type of Advance Directive Living will;Out of facility DNR (pink MOST or yellow form)  Does patient want to make changes to medical advance directive? No - Patient declined  Copy of [place] in Chart? -  Would patient like information on creating a medical advance directive? -  Pre-existing out of facility DNR order (yellow form or pink MOST form) Yellow form placed in chart (order not valid for inpatient use)     Chief Complaint  Patient presents with   Medical Management of Chronic Issues    Patient returns to the clinic follow up and establish with Dr. Lyndel Safe.    Health Maintenance    Shingrix. #4 Covid (not in matrix or [facility])    HPI: Patient is a 85 y.o. male seen today for medical management of chronic diseases.   Patient has a history of refractive seizure disorder for 30 years Is on multiple medications follow closely with neurology had seen a number of specialist has had surgery done and now also has a vagal stimulator.  Also gets as needed lorazepam .  His seizure are complex including hallucinations.  He states that he knows they are coming.  He had a seizure on 07/29 responded well to IM lorazepam Hyponatremia On sodium tablets and water restriction.  Last sodium was 129.  Refused to get it checked again.  Patient upset that nothing seems to help his sodium.  Does not want any change in his sodium tablets History of chronic A. fib s/p ICD defibrillator On chronic Coumadin and flecainide.  Patient has not seen a cardiologist in [facility] Per Dr. Cyndi Lennert note.  He used to follow with someone in [place] History of BPH States that taking sodium tablets had made his symptoms worse.  Has seen urology who told them there is nothing more they can do for  him  Did not have any active complaints.  He states he did not get blood test unless serial Dilantin and iron are checked at the same time.  He lives in [place] walks with his walker.  No recent falls Appetite is good.  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[place])    Urinary urgency     Past Surgical History:  Procedure Laterality Date   CARDIAC DEFIBRILLATOR PLACEMENT     cataract surgery     COLONOSCOPY     CRANIOTOMY Right    right anterior temporal resection   defibrillator replaced  March 2016   IMPLANTATION VAGAL NERVE STIMULATOR  June 2016   NASAL SINUS SURGERY     TONSILLECTOMY     age 55   VASECTOMY  31    Allergies  Allergen Reactions   Depakote [Divalproex Sodium] Other (See  Comments)    Arthralgias     Outpatient Encounter Medications as of 01/17/2021  Medication Sig   acetaminophen (TYLENOL) 325 MG tablet Take 650 mg by mouth every 6 (six) hours as needed.   ALPRAZolam (XANAX) 0.25 MG tablet Take 1 tablet (0.25 mg total) by mouth 2 (two) times daily.   brimonidine-timolol (COMBIGAN) 0.2-0.5 % ophthalmic solution Place 1 drop into both eyes every 12 (twelve) hours.    cholecalciferol (VITAMIN D) 400 units TABS tablet Take 400 Units by mouth 2 (two) times daily.   clobetasol cream (TEMOVATE) AB-123456789 % Apply 1 application topically 2 (two) times daily as needed (For Flares).   clotrimazole (LOTRIMIN) 1 % cream Apply 1 application topically 2 (two) times daily. for athletes foot   finasteride (PROSCAR) 5 MG tablet Take 5  mg by mouth every morning.    flecainide (TAMBOCOR) 50 MG tablet Take 50 mg by mouth 2 (two) times daily.   fluocinonide ointment (LIDEX) AB-[id number] % Apply 1 application topically 2 (two) times daily as needed (on rash /skin folds, not for face).    ketoconazole (NIZORAL) 2 % shampoo Apply 1 application topically 2 (two) times a week. For seborrheic dermatitis on Monday and Thursday.   Lacosamide (VIMPAT) 150 MG TABS Take 1 tablet (150 mg total) by mouth 2 (two) times daily.   latanoprost (XALATAN) 0.005 % ophthalmic solution Place 1 drop into both eyes at bedtime.   Levetiracetam 750 MG TB24 Take 2,250 mg by mouth daily. Take three tabs daily at 6pm   LORazepam (ATIVAN) 2 MG/ML concentrated solution Give 2 mg IM for seizure.If continued seizure activity after 15 minutes, give 2 mg again as needed.   LORazepam (LORAZEPAM INTENSOL) 2 MG/ML concentrated solution Take 0.5 mLs (1 mg total) by mouth daily as needed (postictal anxiety).   phenytoin (DILANTIN) 100 MG ER capsule Take 300 mg by mouth at bedtime. Special instruction: Dilantin 100 mg take 3 capsules (300) mg in the evening. Once an evening 06:30   phenytoin (DILANTIN) 50 MG tablet Chew 75 mg by mouth daily. Chew 1.5 tablets (75 mg) by mouth daily one a morning (6:30) am   polyethylene glycol (MIRALAX / GLYCOLAX) packet Take 17 g by mouth See admin instructions. Once daily every other day and as needed for constipation   polyethylene glycol powder (GLYCOLAX/MIRALAX) 17 GM/SCOOP powder Take by mouth.   sodium chloride 1 g tablet Take 1 tablet (1 g total) by mouth 2 (two) times daily with a meal.   tamsulosin (FLOMAX) 0.4 MG CAPS capsule Take 0.4 mg by mouth every morning.   triamcinolone cream (KENALOG) 0.1 % Apply 1 application topically 2 (two) times daily as needed (for flares).    warfarin (COUMADIN) 2.5 MG tablet Take 1 tablet (2.5 mg total) by mouth daily at 12 noon.   [DISCONTINUED] cephALEXin (KEFLEX) 500 MG capsule Take 1 capsule (500 mg  total) by mouth 4 (four) times daily.   No facility-administered encounter medications on file as of 01/17/2021.    Review of Systems:  Review of Systems  Constitutional:  Positive for activity change.  HENT: Negative.    Respiratory: Negative.    Cardiovascular: Negative.   Gastrointestinal: Negative.   Genitourinary:  Positive for frequency.  Musculoskeletal:  Positive for gait problem.  Skin:  Positive for color change and rash.  Neurological:  Negative for dizziness.  Hematological:  Bruises/bleeds easily.  Psychiatric/Behavioral:  Positive for confusion and dysphoric mood.    Health Maintenance  Topic Date Due  Zoster Vaccines- Shingrix (2 of 2) 01/29/2017   COVID-19 Vaccine (4 - Booster for Moderna series) 08/01/2020   INFLUENZA VACCINE  01/15/2021   TETANUS/TDAP  09/18/2030   PNA vac Low Risk Adult  Completed   HPV VACCINES  Aged Out    Physical Exam: Vitals:   01/17/21 0900  BP: 126/74  Pulse: 85  Temp: (!) 96.4 F (35.8 C)  SpO2: 98%  Weight: 177 lb 9.6 oz (80.6 kg)  Height: '5\' 9"'$  (1.753 m)   Body mass index is 26.23 kg/m. Physical Exam Constitutional: Oriented to person, place, and time. Well-developed and well-nourished.  HENT:  Head: Normocephalic.  Ears TM normal with minimum Wax Mouth/Throat: Oropharynx is clear and moist.  Eyes: Pupils are equal, round, and reactive to light.  Neck: Neck supple.  Cardiovascular: Normal rate and normal heart sounds.  No murmur heard. Pulmonary/Chest: Effort normal and breath sounds normal. No respiratory distress. No wheezes. has no rales.  Abdominal: Soft. Bowel sounds are normal. No distension. There is no tenderness. There is no rebound.  Musculoskeletal: No edema.  Lymphadenopathy: none Neurological: Alert and oriented to person, place, and time.  Walking well with his walker Skin: Skin is warm and dry.  Psychiatric: Normal mood and affect. Behavior is normal. Thought content normal.  Gets irritated easily   Labs reviewed: Basic Metabolic Panel: Recent Labs    08/08/20 0000 11/21/20 0000 01/08/21 0000  NA 135* 133* 129*  K 4.3 4.8 5.2  CL 101 100 95*  CO2 24* 26* 19  BUN '11 12 11  '$ CREATININE 0.9 0.8 0.8  CALCIUM 8.6* 9.1 9.1   Liver Function Tests: No results for input(s): AST, ALT, ALKPHOS, BILITOT, PROT, ALBUMIN in the last 8760 hours. No results for input(s): LIPASE, AMYLASE in the last 8760 hours. No results for input(s): AMMONIA in the last 8760 hours. CBC: No results for input(s): WBC, NEUTROABS, HGB, HCT, MCV, PLT in the last 8760 hours. Lipid Panel: No results for input(s): CHOL, HDL, LDLCALC, TRIG, CHOLHDL, LDLDIRECT in the last 8760 hours. No results found for: HGBA1C  Procedures since last visit: No results found.  Assessment/Plan Refractory epilepsy (HCC) On Dilantin, Vimpat and Keprra Does get Breakthrough seizures Respond to Ativan Also has vagal Animal nutritionist with Neurology  Hyponatremia Sodium was low last BMP Water restriction  On Sodium table Refusing to redraw right now Does not want me to change Sodium tab dosing Chronic atrial fibrillation ([place]) On Flecainide On Coumadin Referal To cardiology Local in [facility]  Benign prostatic hyperplasia with urinary frequency Has seen Urology On Proscar And FLomax  ICD (implantable cardioverter-defibrillator) in place Referal To  Cardiology Frequent falls Due to his Orthostatics No Recent one Parkinsonian syndrome associated with symptomatic orthostatic hypotension ([place]) Per Dr Jannifer Franklin not progressive / Definite Diagnosis Not on any meds Anxiety Has been on Xanax for long time  [place] Disease On Kenalog Labs/tests ordered:  * No order type specified * Next appt:  Visit date not found

## 2021-01-31 DIAGNOSIS — L570 Actinic keratosis: Secondary | ICD-10-CM | POA: Diagnosis not present

## 2021-01-31 DIAGNOSIS — B353 Tinea pedis: Secondary | ICD-10-CM | POA: Diagnosis not present

## 2021-01-31 DIAGNOSIS — D1801 Hemangioma of skin and subcutaneous tissue: Secondary | ICD-10-CM | POA: Diagnosis not present

## 2021-01-31 DIAGNOSIS — D692 Other nonthrombocytopenic purpura: Secondary | ICD-10-CM | POA: Diagnosis not present

## 2021-01-31 DIAGNOSIS — L111 Transient acantholytic dermatosis [Grover]: Secondary | ICD-10-CM | POA: Diagnosis not present

## 2021-02-14 DIAGNOSIS — Z7901 Long term (current) use of anticoagulants: Secondary | ICD-10-CM | POA: Diagnosis not present

## 2021-02-14 DIAGNOSIS — Z791 Long term (current) use of non-steroidal anti-inflammatories (NSAID): Secondary | ICD-10-CM | POA: Diagnosis not present

## 2021-02-16 ENCOUNTER — Telehealth: Payer: Self-pay | Admitting: Neurology

## 2021-02-16 ENCOUNTER — Other Ambulatory Visit: Payer: Self-pay | Admitting: Neurology

## 2021-02-16 NOTE — Telephone Encounter (Signed)
Cameron Potts from [place] called dilantin level 24.9 [phone] I called back rang several times and went to voicemail, asked her to page me back or call my cell phone as I needed more information thanks

## 2021-02-16 NOTE — Telephone Encounter (Signed)
[facility] called dilantin level 24.9 [phone]. Patient has been on the same/similar dose for several years ('75mg'$  in the morning and '300mg'$ ) in the evening. His last sodium was improved at 138 per nurse I spoke to. Nurse was not sure when the dilantin level lab was collected, before or after his morning dose. I asked her to recheck level and make sure it is in the morning before his morning dose. In the meantime he is at his baseline, no changes in gait or mentation, if he should decline please call us back for now continue same dosing and recheck before morning dose. His dilantin has been slightly elevated in the past, 22 in 07/2020, reviewed last scanned report from [facility].

## 2021-02-19 ENCOUNTER — Telehealth: Payer: Self-pay | Admitting: Neurology

## 2021-02-19 DIAGNOSIS — G40911 Epilepsy, unspecified, intractable, with status epilepticus: Secondary | ICD-10-CM | POA: Diagnosis not present

## 2021-02-19 NOTE — Telephone Encounter (Signed)
I returned call from [place] from [place] regarding this patient's Dilantin level drawn this morning being 21.6 which shows slightly lower than 24.9 on 02/14/2021.  Patient is doing well and has had no further breakthrough seizures hence I did not recommend any medication change in stated he continues long-term dose of Dilantin 75 mg in the morning and 300 at night.  If he continues to have breakthrough seizures may need to consider addition of an alternative agent.  They were advised to call the office in get Dr. Tobey Grim opinion on that.  She voiced understanding

## 2021-02-20 NOTE — Telephone Encounter (Signed)
I agree with assessment.

## 2021-03-05 ENCOUNTER — Other Ambulatory Visit: Payer: Self-pay | Admitting: Adult Health

## 2021-03-05 MED ORDER — LACOSAMIDE 150 MG PO TABS: 150.0000 mg | ORAL_TABLET | Freq: Two times a day (BID) | ORAL | 3 refills | Status: DC

## 2021-03-12 DIAGNOSIS — Z5181 Encounter for therapeutic drug level monitoring: Secondary | ICD-10-CM | POA: Diagnosis not present

## 2021-03-12 LAB — BASIC METABOLIC PANEL
BUN: 11 (ref 4–21)
CO2: 24 — AB (ref 13–22)
Chloride: 101 (ref 99–108)
Creatinine: 0.7 (ref 0.6–1.3)
Glucose: 88
Potassium: 4.4 mEq/L (ref 3.5–5.1)
Sodium: 132 — AB (ref 137–147)

## 2021-03-12 LAB — COMPREHENSIVE METABOLIC PANEL: Calcium: 8.7 (ref 8.7–10.7)

## 2021-03-12 LAB — POCT INR: INR: 27.7 — AB (ref 0.80–1.20)

## 2021-03-14 ENCOUNTER — Ambulatory Visit (INDEPENDENT_AMBULATORY_CARE_PROVIDER_SITE_OTHER): Payer: Medicare Other | Admitting: Neurology

## 2021-03-14 ENCOUNTER — Encounter: Payer: Self-pay | Admitting: Neurology

## 2021-03-14 VITALS — BP 162/72 | HR 62 | Ht 68.0 in | Wt 178.0 lb

## 2021-03-14 DIAGNOSIS — R269 Unspecified abnormalities of gait and mobility: Secondary | ICD-10-CM

## 2021-03-14 DIAGNOSIS — Z5181 Encounter for therapeutic drug level monitoring: Secondary | ICD-10-CM

## 2021-03-14 DIAGNOSIS — G40919 Epilepsy, unspecified, intractable, without status epilepticus: Secondary | ICD-10-CM | POA: Diagnosis not present

## 2021-03-14 DIAGNOSIS — E538 Deficiency of other specified B group vitamins: Secondary | ICD-10-CM

## 2021-03-14 NOTE — Progress Notes (Signed)
Reason for visit: Intractable seizures, gait disorder  Cameron Potts is an 85 y.o. male  History of present illness:  Mr. Cameron Potts is an 85 year old right-handed white male with a history of intractable seizures.  He currently is on Dilantin, Vimpat, and Keppra and he continues to have breakthrough seizures.  He last had a brief event that occurred 2 weeks ago.  He had a fall associated with this.  He had a another seizure about a month prior to this.  He has Ativan that he receives as needed at his extended care facility.  His most recent Dilantin level on 12 March 2021 was 18.0.  The patient is also on Coumadin.  He walks with a walker.  He is getting physical therapy on a regular basis.  He has had a general decline in his balance according to his wife and according to the patient.  He has a vagal nerve stimulator in place, this was inserted in 2013.  He tolerates this fairly well.  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 He has never used smokeless tobacco. He reports that he does not currently use alcohol. He reports that he does not use drugs.    Allergies  Allergen Reactions   Depakote [Divalproex Sodium] Other (See Comments)    Arthralgias     Medications:  Prior to Admission medications   Medication Sig Start Date End Date Taking? Authorizing Provider  acetaminophen (TYLENOL) 325 MG tablet Take 650 mg by mouth every 6 (six) hours as needed.   Yes [provider]  ALPRAZolam (XANAX) 0.25 MG tablet Take 1 tablet (0.25 mg total) by mouth 2 (two) times daily. 10/04/20  Yes Fargo, Amy E, NP  brimonidine-timolol (COMBIGAN) 0.2-0.5 % ophthalmic solution Place 1 drop into both eyes every 12 (twelve) hours.  01/05/18  Yes [provider]  cholecalciferol (VITAMIN D) 400 units TABS tablet Take 400 Units by mouth 2 (two) times daily.   Yes [provider]  clobetasol cream (TEMOVATE) 9.77 % Apply 1 application topically 2 (two) times daily as needed (For Flares).   Yes [provider]  clotrimazole (LOTRIMIN) 1 % cream Apply 1 application topically 2 (two) times daily. for athletes foot   Yes [provider]  finasteride (PROSCAR) 5 MG tablet Take 5 mg by mouth every morning.  02/12/19  Yes [provider]  flecainide (TAMBOCOR) 50 MG tablet Take 50 mg by mouth 2 (two) times daily.   Yes [provider]  fluocinonide ointment (LIDEX) 9.67 % Apply 1 application topically 2 (two) times daily as needed (on rash /skin folds, not for face).    Yes [provider]  ketoconazole (NIZORAL) 2 % shampoo Apply 1 application topically 2 (two) times a week. For seborrheic dermatitis on Monday and Thursday.   Yes [provider]  Lacosamide (VIMPAT) 150 MG TABS Take 1 tablet (150 mg total) by mouth 2 (two) times daily. 03/05/21  Yes Wert, Margreta Journey, NP  latanoprost (XALATAN) 0.005 % ophthalmic solution Place 1 drop into both eyes at bedtime.    Yes [provider]  Levetiracetam 750 MG TB24 Take 2,250 mg by mouth daily. Take three tabs daily at 6pm   Yes [provider]  LORazepam (ATIVAN) 2 MG/ML concentrated solution Give 2 mg IM for seizure.If continued seizure activity after 15 minutes, give 2 mg again as needed. 10/30/20  Yes Wert, Margreta Journey, NP  LORazepam (LORAZEPAM INTENSOL) 2 MG/ML concentrated solution Take 0.5 mLs (1 mg total) by mouth daily as needed (postictal anxiety). 10/30/20  Yes Wert, Margreta Journey, NP  phenytoin (DILANTIN) 100 MG ER capsule Take 300 mg by mouth at bedtime. Special instruction: Dilantin 100 mg take 3 capsules (300) mg in the evening. Once an evening 06:30   Yes [provider]  phenytoin (DILANTIN) 50 MG tablet Chew 75 mg by mouth daily. Chew 1.5 tablets (75 mg) by mouth daily one a morning (6:30) am   Yes [provider]  polyethylene glycol (MIRALAX / GLYCOLAX) packet Take 17 g by mouth See admin instructions. Once daily every other day and as needed for constipation   Yes [provider]  polyethylene glycol powder (GLYCOLAX/MIRALAX) 17 GM/SCOOP powder Take by mouth.   Yes [provider]  sodium chloride 1 g tablet Take 1 tablet (1 g total) by mouth 2 (two) times daily with a meal. 02/01/19  Yes Vasireddy, Grier Mitts, MD  tamsulosin (FLOMAX) 0.4 MG CAPS capsule Take 0.4 mg by mouth every morning.   Yes [provider]  triamcinolone cream (KENALOG) 0.1 % Apply 1 application topically 2 (two) times daily as needed (for flares).    Yes [provider]  warfarin (COUMADIN) 2.5 MG tablet Take 1 tablet (2.5 mg total) by mouth daily at 12 noon. 02/12/19  Yes Reed, Tiffany L, DO    ROS:  Out of a complete 14 system review of symptoms, the patient complains only of the following symptoms, and all other reviewed systems are negative.  Walking difficulty Seizures  Blood pressure (!) 162/72, pulse 62, height 5\' 8"  (1.727 m), weight 178 lb (80.7  kg).  Blood pressure, right arm, standing is 893 systolic  Physical Exam  General: The patient is alert and cooperative at the time of the examination.  Skin: No significant peripheral edema is noted.   Neurologic Exam  Mental status: The patient is alert and oriented x 3 at the time of the examination. The patient has apparent normal recent and remote memory, with an apparently normal attention span and concentration ability.   Cranial nerves: Facial symmetry is present. Speech is normal, no aphasia or dysarthria is noted. Extraocular movements are full. Visual fields are full.  Motor: The patient has good strength in all 4 extremities.  Sensory examination: Soft touch sensation is symmetric on the face, arms, and legs.  Coordination: The patient has good finger-nose-finger and heel-to-shin bilaterally.  Gait and station: The patient has a somewhat wide-based gait, the patient walks with a walker.  With the walker, he has good stride and turns, good stability.  Tandem gait was not attempted.  Romberg is positive, he tends to fall backwards.  Reflexes: Deep tendon reflexes are symmetric.   Assessment/Plan:  1.  Intractable seizures  2.  Gait disorder  The patient has had difficulty fully regulating his Dilantin levels as he is also on Coumadin.  His Dilantin levels will tend to drift up and down over time.  He will remain on his current dose of Dilantin.  We will check levels of Vimpat and Keppra today.  Some of his chronic gait issues may be related to chronic use of Dilantin with its subsequent cerebellar toxicity.  The patient is getting physical therapy.  His vagal nerve stimulator was interrogated today, the settings were unchanged, the battery is still doing well.  He will follow-up here in 6 months, in the future he can be followed through Dr. April Manson.  Jill Alexanders MD 03/14/2021 11:43 AM  [facility] [address] [place], [place]  [address]  Phone [phone] Fax [phone]

## 2021-03-15 DIAGNOSIS — R531 Weakness: Secondary | ICD-10-CM | POA: Diagnosis not present

## 2021-03-15 DIAGNOSIS — G40911 Epilepsy, unspecified, intractable, with status epilepticus: Secondary | ICD-10-CM | POA: Diagnosis not present

## 2021-03-15 DIAGNOSIS — Z79899 Other long term (current) drug therapy: Secondary | ICD-10-CM | POA: Diagnosis not present

## 2021-03-15 LAB — COMPREHENSIVE METABOLIC PANEL: Calcium: 8.7 (ref 8.7–10.7)

## 2021-03-15 LAB — BASIC METABOLIC PANEL
BUN: 11 (ref 4–21)
CO2: 24 — AB (ref 13–22)
Chloride: 101 (ref 99–108)
Creatinine: 0.7 (ref 0.6–1.3)
Glucose: 88
Potassium: 4.4 (ref 3.4–5.3)
Sodium: 132 — AB (ref 137–147)

## 2021-03-15 LAB — VITAMIN B12: Vitamin B-12: 587

## 2021-03-19 ENCOUNTER — Telehealth: Payer: Self-pay | Admitting: *Deleted

## 2021-03-19 ENCOUNTER — Telehealth: Payer: Self-pay | Admitting: Neurology

## 2021-03-19 NOTE — Telephone Encounter (Signed)
Received 03/12/22 lab results from [place], placed on M/D desk for review.

## 2021-03-19 NOTE — Telephone Encounter (Signed)
Results reviewed, documented in telephone note.

## 2021-03-19 NOTE — Telephone Encounter (Signed)
Blood work results from blood drawn on 12 March 2021.  INR was 2.8.  Chemistry panel revealed a glucose of 88, calcium 8.7, creatinine of 0.68, BUN of 11.2, sodium 132, potassium 4.4, chloride of 101, CO2 24, estimated GFR greater than 90.  Dilantin level of 18.0.  Vitamin B12 level of 587.  Keppra level of 40.6.

## 2021-03-21 DIAGNOSIS — Z23 Encounter for immunization: Secondary | ICD-10-CM | POA: Diagnosis not present

## 2021-03-28 DIAGNOSIS — Z23 Encounter for immunization: Secondary | ICD-10-CM | POA: Diagnosis not present

## 2021-04-02 ENCOUNTER — Other Ambulatory Visit: Payer: Self-pay | Admitting: Adult Health

## 2021-04-02 DIAGNOSIS — R451 Restlessness and agitation: Secondary | ICD-10-CM

## 2021-04-02 MED ORDER — ALPRAZOLAM 0.25 MG PO TABS: 0.2500 mg | ORAL_TABLET | Freq: Two times a day (BID) | ORAL | 5 refills | Status: DC

## 2021-04-03 DIAGNOSIS — H401133 Primary open-angle glaucoma, bilateral, severe stage: Secondary | ICD-10-CM | POA: Diagnosis not present

## 2021-04-03 DIAGNOSIS — Z961 Presence of intraocular lens: Secondary | ICD-10-CM | POA: Diagnosis not present

## 2021-04-23 ENCOUNTER — Other Ambulatory Visit: Payer: Self-pay

## 2021-04-23 ENCOUNTER — Encounter: Payer: Self-pay | Admitting: Adult Health

## 2021-04-23 ENCOUNTER — Non-Acute Institutional Stay: Payer: Medicare Other | Admitting: Adult Health

## 2021-04-23 VITALS — BP 138/94 | HR 65 | Temp 96.3°F | Ht 68.0 in | Wt 176.4 lb

## 2021-04-23 DIAGNOSIS — E871 Hypo-osmolality and hyponatremia: Secondary | ICD-10-CM

## 2021-04-23 DIAGNOSIS — H401133 Primary open-angle glaucoma, bilateral, severe stage: Secondary | ICD-10-CM | POA: Diagnosis not present

## 2021-04-23 DIAGNOSIS — I4891 Unspecified atrial fibrillation: Secondary | ICD-10-CM

## 2021-04-23 DIAGNOSIS — G40909 Epilepsy, unspecified, not intractable, without status epilepticus: Secondary | ICD-10-CM | POA: Diagnosis not present

## 2021-04-23 DIAGNOSIS — D6869 Other thrombophilia: Secondary | ICD-10-CM

## 2021-04-23 DIAGNOSIS — N400 Enlarged prostate without lower urinary tract symptoms: Secondary | ICD-10-CM | POA: Diagnosis not present

## 2021-04-23 DIAGNOSIS — I482 Chronic atrial fibrillation, unspecified: Secondary | ICD-10-CM

## 2021-04-23 NOTE — Progress Notes (Signed)
Location: [facility]  POS:  clinic  Provider:  Cindi Carbon, [place] [phone]   Code Status: DNR has most form indicating no hospitalizations  Goals of Care:  Advanced Directives 01/17/2021  Does Patient Have a Medical Advance Directive? Yes  Type of Advance Directive Living will;Out of facility DNR (pink MOST or yellow form)  Does patient want to make changes to medical advance directive? No - Patient declined  Copy of [place] in Chart? -  Would patient like information on creating a medical advance directive? -  Pre-existing out of facility DNR order (yellow form or pink MOST form) Yellow form placed in chart (order not valid for inpatient use)     Chief Complaint  Patient presents with   Medical Management of Chronic Issues    Patient returns to the clinic for follow up.    HPI: Patient is a 85 y.o. Potts seen today for medical management of chronic diseases.    Seizures: has not had one recently, on dilantin, vimpat, xanax, keppra Dilantin level 18 on 9/26  Note in matrix in Oct notes confusion, coming out of room having forgot his schedule and if he had eaten. Has some underlying memory loss but refuses to do MMSE test.   He practices clean living, no smoking or drinking.  Has PD as well, uses a walker.   Continues on coumadin for CVA risk reduction. No bleeding.   Pt has vagas nerve stimulator  Also has ICD  Recently seen by ophthalmology for glaucoma, currently on Combigan.   NA 132 03/15/21, currently on 1500 cc FR and salty snacks encouraged.   BPs reviewed in matrix.  Blood Pressure: 122 / 77 mmHg  Blood Pressure: 122 / 69 mmHg  Blood Pressure: 138 / 77 mmHg   Blood Pressure: 116 / 76 mmHg   Blood Pressure: 164 / 84 mmHg  Hx of frequent falls due to gait instability, poor safety awareness and judgement  Past Medical History:  Diagnosis Date   Abnormality of gait 12/21/2013   Atrial fibrillation (HCC)     Blind left eye    Carcinoma in situ of prostate    Cardiac arrest ([place])    Glaucoma    Hypercholesteremia    Hyperlipidemia    Metabolic encephalopathy    Mild left ventricular systolic dysfunction    Presence of other specified functional implants    PSA elevation    Right frontal lobe lesion    Seizures (HCC)    Tinea pedis    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[place])    Urinary urgency     Past Surgical History:  Procedure Laterality Date   CARDIAC DEFIBRILLATOR PLACEMENT     cataract surgery     COLONOSCOPY     CRANIOTOMY Right    right anterior temporal resection   defibrillator replaced  March 2016   IMPLANTATION VAGAL NERVE STIMULATOR  June 2016   NASAL SINUS SURGERY     TONSILLECTOMY     age 85   VASECTOMY  9    Allergies  Allergen Reactions   Depakote [Divalproex Sodium] Other (See Comments)    Arthralgias     Outpatient Encounter Medications as of 04/23/2021  Medication Sig   acetaminophen (TYLENOL) 325 MG tablet Take 650 mg by mouth every 6 (six) hours as needed.   brimonidine-timolol (COMBIGAN) 0.2-0.5 % ophthalmic solution Place 1 drop into both eyes every 12 (twelve) hours.    cholecalciferol (VITAMIN D) 400 units  TABS tablet Take 400 Units by mouth 2 (two) times daily.   clobetasol cream (TEMOVATE) 6.38 % Apply 1 application topically 2 (two) times daily as needed (For Flares).   clotrimazole (LOTRIMIN) 1 % cream Apply 1 application topically 2 (two) times daily. for athletes foot   finasteride (PROSCAR) 5 MG tablet Take 5 mg by mouth every morning.    flecainide (TAMBOCOR) 50 MG tablet Take 50 mg by mouth 2 (two) times daily.   fluocinonide ointment (LIDEX) 4.66 % Apply 1 application topically 2 (two) times daily as needed (on rash /skin folds, not for face).    ketoconazole (NIZORAL) 2 % shampoo Apply 1 application topically 2 (two) times a week. For seborrheic dermatitis on Monday and Thursday.   Lacosamide (VIMPAT) 150 MG TABS Take 1 tablet (150  mg total) by mouth 2 (two) times daily.   latanoprost (XALATAN) 0.005 % ophthalmic solution Place 1 drop into both eyes at bedtime.   Levetiracetam 750 MG TB24 Take 2,250 mg by mouth daily. Take three tabs daily at 6pm   LORazepam (ATIVAN) 2 MG/ML concentrated solution Give 2 mg IM for seizure.If continued seizure activity after 15 minutes, give 2 mg again as needed.   LORazepam (LORAZEPAM INTENSOL) 2 MG/ML concentrated solution Take 0.5 mLs (1 mg total) by mouth daily as needed (postictal anxiety).   phenytoin (DILANTIN) 100 MG ER capsule Take 300 mg by mouth at bedtime. Special instruction: Dilantin 100 mg take 3 capsules (300) mg in the evening. Once an evening 06:30   phenytoin (DILANTIN) 50 MG tablet Chew 75 mg by mouth daily. Chew 1.5 tablets (75 mg) by mouth daily one a morning (6:30) am   polyethylene glycol (MIRALAX / GLYCOLAX) packet Take 17 g by mouth See admin instructions. Once daily every other day and as needed for constipation   sodium chloride 1 g tablet Take 1 tablet (1 g total) by mouth 2 (two) times daily with a meal.   tamsulosin (FLOMAX) 0.4 MG CAPS capsule Take 0.4 mg by mouth every morning.   triamcinolone cream (KENALOG) 0.1 % Apply 1 application topically 2 (two) times daily as needed (for flares).    warfarin (COUMADIN) 2.5 MG tablet Take 1 tablet (2.5 mg total) by mouth daily at 12 noon.   ALPRAZolam (XANAX) 0.25 MG tablet Take 1 tablet (0.25 mg total) by mouth 2 (two) times daily.   polyethylene glycol powder (GLYCOLAX/MIRALAX) 17 GM/SCOOP powder Take by mouth.   No facility-administered encounter medications on file as of 04/23/2021.    Review of Systems:  Review of Systems  Constitutional:  Negative for activity change, appetite change, chills, diaphoresis, fatigue, fever and unexpected weight change.  HENT:  Negative for trouble swallowing.   Eyes:        Vision loss  Respiratory:  Negative for cough, shortness of breath, wheezing and stridor.   Cardiovascular:   Positive for leg swelling. Negative for chest pain and palpitations.  Gastrointestinal:  Negative for abdominal distention, abdominal pain, constipation and diarrhea.  Genitourinary:  Negative for difficulty urinating and dysuria.  Musculoskeletal:  Positive for gait problem. Negative for arthralgias, back pain, joint swelling and myalgias.  Neurological:  Negative for dizziness, seizures, syncope, facial asymmetry, speech difficulty, weakness and headaches.  Hematological:  Negative for adenopathy. Does not bruise/bleed easily.  Psychiatric/Behavioral:  Positive for confusion. Negative for agitation and behavioral problems.    Health Maintenance  Topic Date Due   Zoster Vaccines- Shingrix (2 of 2) 01/29/2017   COVID-19 Vaccine (  4 - Booster for Moderna series) 05/23/2021   TETANUS/TDAP  09/18/2030   Pneumonia Vaccine 60+ Years old  Completed   INFLUENZA VACCINE  Completed   HPV VACCINES  Aged Out    Physical Exam: Vitals:   04/23/21 1307  BP: (!) 138/94  Pulse: 65  Temp: (!) 96.3 F (35.7 C)  SpO2: 100%  Weight: 176 lb 6.4 oz (80 kg)  Height: 5\' 8"  (1.727 m)   Body mass index is 26.Cameron kg/m. Wt Readings from Last 3 Encounters:  04/23/21 176 lb 6.4 oz (80 kg)  03/14/21 178 lb (80.7 kg)  01/17/21 177 lb 9.6 oz (80.6 kg)     Physical Exam Vitals and nursing note reviewed.  Constitutional:      General: He is not in acute distress.    Appearance: He is not diaphoretic.  HENT:     Head: Normocephalic and atraumatic.     Nose: Nose normal.     Mouth/Throat:     Mouth: Mucous membranes are moist.     Pharynx: Oropharynx is clear.  Eyes:     Conjunctiva/sclera: Conjunctivae normal.     Pupils: Pupils are equal, round, and reactive to light.  Neck:     Thyroid: No thyromegaly.     Vascular: No JVD.     Trachea: No tracheal deviation.  Cardiovascular:     Rate and Rhythm: Normal rate and regular rhythm.     Heart sounds: No murmur heard. Pulmonary:     Effort:  Pulmonary effort is normal. No respiratory distress.     Breath sounds: Normal breath sounds. No wheezing.  Abdominal:     General: Bowel sounds are normal. There is no distension.     Palpations: Abdomen is soft.     Tenderness: There is no abdominal tenderness.  Musculoskeletal:        General: No swelling, tenderness, deformity or signs of injury.     Comments: BLE edema +1  Lymphadenopathy:     Cervical: No cervical adenopathy.  Skin:    General: Skin is warm and dry.  Neurological:     Mental Status: He is alert and oriented to person, place, and time.     Cranial Nerves: No cranial nerve deficit.     Comments: Shuffling gait No rigidity or tremor  Psychiatric:        Mood and Affect: Mood normal.    Labs reviewed: Basic Metabolic Panel: Recent Labs    11/21/20 0000 01/08/21 0000 03/15/21 0000  NA 133* 129* 132*  K 4.8 5.2 4.4  CL 100 95* 101  CO2 26* 19 24*  BUN 12 11 11   CREATININE 0.8 0.8 0.7  CALCIUM 9.1 9.1 8.7   Liver Function Tests: No results for input(s): AST, ALT, ALKPHOS, BILITOT, PROT, ALBUMIN in the last 8760 hours. No results for input(s): LIPASE, AMYLASE in the last 8760 hours. No results for input(s): AMMONIA in the last 8760 hours. CBC: No results for input(s): WBC, NEUTROABS, HGB, HCT, MCV, PLT in the last 8760 hours. Lipid Panel: No results for input(s): CHOL, HDL, LDLCALC, TRIG, CHOLHDL, LDLDIRECT in the last 8760 hours. No results found for: HGBA1C  Procedures since last visit: No results found.  Assessment/Plan 1. Hyponatremia Continue sodium tablets and fluid restriction Continue to monitor.   2. Seizure disorder ([place]) No new,  followed by neurology Continue Dilantin, Keppra, Vimpat.   3. Chronic atrial fibrillation Rate is controlled on flecainide.   4. Hypercoagulability due to atrial fibrillation ([place])  Continues on Coumadin for CVA risk reduction.   5. Primary open-angle glaucoma, bilateral, severe stage Followed by  ophthalmology and on combigan   6. Benign prostatic hyperplasia without lower urinary tract symptoms No current symptoms on flomax and proscar.  He says he may want to f/u with urology at some point but not today     Labs/tests ordered:  * No order type specified * INR due 11/21. Needs Lipid panel and CBC prior to next visit.  Next appt:  3 months with Dr. Lyndel Safe.    Total time 74min:  time greater than 50% of total time spent doing pt counseling and coordination of care

## 2021-05-02 DIAGNOSIS — L111 Transient acantholytic dermatosis [Grover]: Secondary | ICD-10-CM | POA: Diagnosis not present

## 2021-05-02 DIAGNOSIS — Z85820 Personal history of malignant melanoma of skin: Secondary | ICD-10-CM | POA: Diagnosis not present

## 2021-05-02 DIAGNOSIS — L853 Xerosis cutis: Secondary | ICD-10-CM | POA: Diagnosis not present

## 2021-05-02 DIAGNOSIS — B353 Tinea pedis: Secondary | ICD-10-CM | POA: Diagnosis not present

## 2021-05-02 DIAGNOSIS — L570 Actinic keratosis: Secondary | ICD-10-CM | POA: Diagnosis not present

## 2021-05-02 DIAGNOSIS — L821 Other seborrheic keratosis: Secondary | ICD-10-CM | POA: Diagnosis not present

## 2021-05-08 DIAGNOSIS — E785 Hyperlipidemia, unspecified: Secondary | ICD-10-CM | POA: Diagnosis not present

## 2021-05-08 DIAGNOSIS — Z7901 Long term (current) use of anticoagulants: Secondary | ICD-10-CM | POA: Diagnosis not present

## 2021-05-08 LAB — POCT INR
INR: 2.33 — AB (ref 0.80–1.20)
INR: 2.8 — AB (ref 0.80–1.20)

## 2021-05-08 LAB — BASIC METABOLIC PANEL
BUN: 14 (ref 4–21)
CO2: 22 (ref 13–22)
Chloride: 99 (ref 99–108)
Creatinine: 1 (ref 0.6–1.3)
Glucose: 103
Potassium: 4.5 mEq/L (ref 3.5–5.1)
Sodium: 5 — AB (ref 137–147)

## 2021-05-08 LAB — COMPREHENSIVE METABOLIC PANEL: Calcium: 8.9 (ref 8.7–10.7)

## 2021-05-15 LAB — COMPREHENSIVE METABOLIC PANEL: Calcium: 8.9 (ref 8.7–10.7)

## 2021-05-15 LAB — BASIC METABOLIC PANEL
BUN: 14 (ref 4–21)
CO2: 22 (ref 13–22)
Chloride: 99 (ref 99–108)
Creatinine: 1 (ref 0.6–1.3)
Glucose: 103
Potassium: 4.5 (ref 3.4–5.3)
Sodium: 136 — AB (ref 137–147)

## 2021-05-15 LAB — LIPID PANEL
Cholesterol: 214 — AB (ref 0–200)
HDL: 61 (ref 35–70)
LDL Cholesterol: 141
LDl/HDL Ratio: 3.5
Triglycerides: 79 (ref 40–160)

## 2021-05-15 LAB — CBC AND DIFFERENTIAL
HCT: 39 — AB (ref 41–53)
Hemoglobin: 12.6 — AB (ref 13.5–17.5)
Platelets: 256 (ref 150–399)
WBC: 6.2

## 2021-05-15 LAB — CBC: RBC: 4.02 (ref 3.87–5.11)

## 2021-05-29 DIAGNOSIS — N401 Enlarged prostate with lower urinary tract symptoms: Secondary | ICD-10-CM | POA: Diagnosis not present

## 2021-05-29 DIAGNOSIS — R351 Nocturia: Secondary | ICD-10-CM | POA: Diagnosis not present

## 2021-05-29 DIAGNOSIS — R350 Frequency of micturition: Secondary | ICD-10-CM | POA: Diagnosis not present

## 2021-06-27 ENCOUNTER — Other Ambulatory Visit: Payer: Self-pay | Admitting: *Deleted

## 2021-06-27 DIAGNOSIS — R451 Restlessness and agitation: Secondary | ICD-10-CM

## 2021-06-27 NOTE — Telephone Encounter (Signed)
Received refill Request from [facility].  Pended Rx and sent to Dr. Lyndel Safe for approval.

## 2021-06-28 MED ORDER — ALPRAZOLAM 0.25 MG PO TABS: 0.2500 mg | ORAL_TABLET | Freq: Two times a day (BID) | ORAL | 5 refills | Status: DC

## 2021-07-05 ENCOUNTER — Other Ambulatory Visit: Payer: Self-pay | Admitting: Adult Health

## 2021-07-05 MED ORDER — LACOSAMIDE 150 MG PO TABS: 150.0000 mg | ORAL_TABLET | Freq: Two times a day (BID) | ORAL | 3 refills | Status: DC

## 2021-07-09 ENCOUNTER — Other Ambulatory Visit: Payer: Self-pay | Admitting: Internal Medicine

## 2021-07-09 DIAGNOSIS — G40919 Epilepsy, unspecified, intractable, without status epilepticus: Secondary | ICD-10-CM

## 2021-07-09 MED ORDER — LORAZEPAM 2 MG/ML PO CONC: ORAL | 0 refills | Status: DC

## 2021-07-09 NOTE — Progress Notes (Signed)
Ativan Ordered

## 2021-07-24 ENCOUNTER — Other Ambulatory Visit: Payer: Self-pay | Admitting: *Deleted

## 2021-07-24 DIAGNOSIS — R451 Restlessness and agitation: Secondary | ICD-10-CM

## 2021-07-24 NOTE — Telephone Encounter (Signed)
This has several refills already

## 2021-07-24 NOTE — Telephone Encounter (Signed)
[facility] requested refill: Pended Rx and sent to Christy for approval.    

## 2021-07-25 ENCOUNTER — Encounter: Payer: Medicare Other | Admitting: Internal Medicine

## 2021-07-30 DIAGNOSIS — Z7901 Long term (current) use of anticoagulants: Secondary | ICD-10-CM | POA: Diagnosis not present

## 2021-07-30 LAB — BASIC METABOLIC PANEL
BUN: 14 (ref 4–21)
CO2: 22 (ref 13–22)
Chloride: 104 (ref 99–108)
Creatinine: 0.7 (ref 0.6–1.3)
Glucose: 92
Potassium: 4.8 (ref 3.4–5.3)
Sodium: 142 (ref 137–147)

## 2021-07-30 LAB — COMPREHENSIVE METABOLIC PANEL: Calcium: 9.3 (ref 8.7–10.7)

## 2021-07-30 LAB — POCT INR: INR: 2.33 — AB (ref 0.80–1.20)

## 2021-08-20 DIAGNOSIS — N401 Enlarged prostate with lower urinary tract symptoms: Secondary | ICD-10-CM | POA: Diagnosis not present

## 2021-08-20 DIAGNOSIS — R351 Nocturia: Secondary | ICD-10-CM | POA: Diagnosis not present

## 2021-08-20 DIAGNOSIS — G20 Parkinson's disease: Secondary | ICD-10-CM | POA: Diagnosis not present

## 2021-08-20 DIAGNOSIS — R350 Frequency of micturition: Secondary | ICD-10-CM | POA: Diagnosis not present

## 2021-08-24 DIAGNOSIS — Z20822 Contact with and (suspected) exposure to covid-19: Secondary | ICD-10-CM | POA: Diagnosis not present

## 2021-09-13 ENCOUNTER — Ambulatory Visit (INDEPENDENT_AMBULATORY_CARE_PROVIDER_SITE_OTHER): Payer: Medicare Other | Admitting: Neurology

## 2021-09-13 ENCOUNTER — Encounter: Payer: Self-pay | Admitting: Neurology

## 2021-09-13 VITALS — BP 141/63 | HR 64 | Ht 68.0 in | Wt 174.5 lb

## 2021-09-13 DIAGNOSIS — G40919 Epilepsy, unspecified, intractable, without status epilepticus: Secondary | ICD-10-CM

## 2021-09-13 DIAGNOSIS — G40211 Localization-related (focal) (partial) symptomatic epilepsy and epileptic syndromes with complex partial seizures, intractable, with status epilepticus: Secondary | ICD-10-CM | POA: Diagnosis not present

## 2021-09-13 DIAGNOSIS — R269 Unspecified abnormalities of gait and mobility: Secondary | ICD-10-CM | POA: Diagnosis not present

## 2021-09-13 MED ORDER — LACOSAMIDE 200 MG PO TABS: 200.0000 mg | ORAL_TABLET | Freq: Two times a day (BID) | ORAL | 11 refills | Status: DC

## 2021-09-13 NOTE — Patient Instructions (Signed)
Increase Lacosamide to 200 mg twice daily  ?Continue your other medications  ?Follow up in 6 months or sooner if worse  ?

## 2021-09-13 NOTE — Progress Notes (Signed)
? ? ?Reason for visit: Intractable seizures, gait disorder ? ?Cameron Potts is an 86 y.o. male ? ?Interval history 09/13/2021:  ?Patient presents today for follow-up, she is accompanied by his wife.  Last visit with Dr. Jannifer Franklin was in September, at that time plan was to continue current medications.  He is known to have breakthrough seizure, and wife report that the last breakthrough seizure was 6 weeks ago.  He does have Ativan currently at the facility and usually gets it as soon as the seizure started because he is known to go into status per wife.  Ativan use to stop the seizures ? ? ?History of present illness: ?Cameron Potts is an 86 year old right-handed white male with a history of intractable seizures.  He currently is on Dilantin, Vimpat, and Keppra and he continues to have breakthrough seizures.  He last had a brief event that occurred 2 weeks ago.  He had a fall associated with this.  He had a another seizure about a month prior to this.  He has Ativan that he receives as needed at his extended care facility.  His most recent Dilantin level on 12 March 2021 was 18.0.  The patient is also on Coumadin.  He walks with a walker.  He is getting physical therapy on a regular basis.  He has had a general decline in his balance according to his wife and according to the patient.  He has a vagal nerve stimulator in place, this was inserted in 2013.  He tolerates this fairly well. ? ?Past Medical History:  ?Diagnosis Date  ? Abnormality of gait 12/21/2013  ? Atrial fibrillation ([place])   ? Blind left eye   ? Carcinoma in situ of prostate   ? Cardiac arrest [facility])   ? Glaucoma   ? Hypercholesteremia   ? Hyperlipidemia   ? Metabolic encephalopathy   ? Mild left ventricular systolic dysfunction   ? Presence of other specified functional implants   ? PSA elevation   ? Right frontal lobe lesion   ? Seizures ([place])   ? Tinea pedis   ? 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 Urinary urgency   ? ? ?Past Surgical  History:  ?Procedure Laterality Date  ? CARDIAC DEFIBRILLATOR PLACEMENT    ? cataract surgery    ? COLONOSCOPY    ? CRANIOTOMY Right   ? right anterior temporal resection  ? defibrillator replaced  March 2016  ? IMPLANTATION VAGAL NERVE STIMULATOR  June 2016  ? NASAL SINUS SURGERY    ? TONSILLECTOMY    ? age 12  ? VASECTOMY  1985  ? ? ?Family History  ?Problem Relation Age of Onset  ? Cancer - Lung Mother   ? Cancer - Prostate Father   ? ? ?Social history:  reports that he has never smoked. He has never used smokeless tobacco. He reports that he does not currently use alcohol. He reports that he does not use drugs. ? ?  ?Allergies  ?Allergen Reactions  ? Depakote [Divalproex Sodium] Other (See Comments)  ?  Arthralgias ?  ? ? ?Medications:  ?Current Meds  ?Medication Sig  ? ALPRAZolam (XANAX) 0.25 MG tablet Take 1 tablet (0.25 mg total) by mouth 2 (two) times daily.  ? brimonidine-timolol (COMBIGAN) 0.2-0.5 % ophthalmic solution Place 1 drop into both eyes every 12 (twelve) hours.   ? cholecalciferol (VITAMIN D) 400 units TABS tablet Take 400 Units by mouth 2 (two) times daily.  ? clobetasol cream (TEMOVATE)  4.17 % Apply 1 application topically 2 (two) times daily as needed (For Flares).  ? finasteride (PROSCAR) 5 MG tablet Take 5 mg by mouth every morning.   ? flecainide (TAMBOCOR) 50 MG tablet Take 50 mg by mouth 2 (two) times daily.  ? lacosamide (VIMPAT) 200 MG TABS tablet Take 1 tablet (200 mg total) by mouth 2 (two) times daily.  ? latanoprost (XALATAN) 0.005 % ophthalmic solution Place 1 drop into both eyes at bedtime.  ? Levetiracetam 750 MG TB24 Take 2,250 mg by mouth daily. Take three tabs daily at 6pm  ? LORazepam (ATIVAN) 2 MG/ML concentrated solution Give 2 mg IM for seizure.If continued seizure activity after 15 minutes, give 2 mg again as needed.  ? LORazepam (LORAZEPAM INTENSOL) 2 MG/ML concentrated solution Take 0.5 mLs (1 mg total) by mouth daily as needed (postictal anxiety).  ? phenytoin  (DILANTIN) 100 MG ER capsule Take 300 mg by mouth at bedtime. Special instruction: Dilantin 100 mg take 3 capsules (300) mg in the evening. Once an evening 06:30  ? phenytoin (DILANTIN) 50 MG tablet Chew 75 mg by mouth daily. Chew 1.5 tablets (75 mg) by mouth daily one a morning (6:30) am  ? polyethylene glycol (MIRALAX / GLYCOLAX) packet Take 17 g by mouth See admin instructions. Once daily every other day and as needed for constipation  ? polyethylene glycol powder (GLYCOLAX/MIRALAX) 17 GM/SCOOP powder Take by mouth.  ? sodium chloride 1 g tablet Take 1 tablet (1 g total) by mouth 2 (two) times daily with a meal.  ? warfarin (COUMADIN) 2.5 MG tablet Take 1 tablet (2.5 mg total) by mouth daily at 12 noon.  ? [DISCONTINUED] Lacosamide (VIMPAT) 150 MG TABS Take 1 tablet (150 mg total) by mouth 2 (two) times daily.  ?  ? ? ?ROS: ?Out of a complete 14 system review of symptoms, the patient complains only of the following symptoms, and all other reviewed systems are negative. ? ?Walking difficulty ?Seizures ? ?Blood pressure (!) 141/63, pulse 64, height '5\' 8"'$  (1.727 m), weight 174 lb 8 oz (79.2 kg). ? ?Blood pressure, right arm, standing is 408 systolic ? ?Physical Exam ? ?General: The patient is alert and cooperative at the time of the examination. ? ?Skin: No significant peripheral edema is noted. ? ? ?Neurologic Exam ? ?Mental status: The patient is alert and oriented x 3 at the time of the examination. The patient has apparent normal recent and remote memory, with an apparently normal attention span and concentration ability. ? ?Cranial nerves: Facial symmetry is present. Speech is normal, no aphasia or dysarthria is noted. Extraocular movements are full. Visual fields are full. ? ?Motor: The patient has good strength in all 4 extremities. ? ?Sensory examination: Soft touch sensation is symmetric on the face, arms, and legs. ? ?Coordination: The patient has good finger-nose-finger and heel-to-shin  bilaterally. ? ?Gait and station: The patient has a somewhat wide-based gait, the patient walks with a walker.  With the walker, he has good stride and turns, good stability.  Tandem gait was not attempted.  Romberg is positive, he tends to fall backwards. ? ?Reflexes: Deep tendon reflexes are symmetric. ? ? ?Assessment/Plan: ? ?1.  Intractable seizures ? ?2.  Gait disorder ? ?He continued to have breakthrough seizures despite VNS and 3 antiepileptic medication.  I will increase his Vimpat to 200 mg twice daily and continue phenytoin and Keppra at the same dose.  He continues to get physical therapy.  I will see him  in 6 months for follow-up, at that time I will interrogate the VNS and adjust settings if needed. ? ? ?Patient Instructions  ?Increase Lacosamide to 200 mg twice daily  ?Continue your other medications  ?Follow up in 6 months or sooner if worse  ? ? ?I have spent a total of 30 minutes dedicated to this patient today, preparing to see patient, performing a medically appropriate examination and evaluation, ordering tests and/or medications and procedures, and counseling and educating the patient/family/caregiver; independently interpreting result and communicating results to the family/patient/caregiver; and documenting clinical information in the electronic medical record. ? ? ?Alric Ran, MD ?09/13/2021 11:44 AM ? ?[facility] ?[place], [place] [address] ? ?Phone [phone] Fax [phone] ? ?

## 2021-09-23 ENCOUNTER — Telehealth: Payer: Self-pay | Admitting: Family

## 2021-09-23 NOTE — Telephone Encounter (Signed)
Phone received from facility Nurse reports patient concerned of being drowsy thinks he has dilantin toxicity.Nurse states patient had an episode of seizure yesterday and was given Ativan I.M and another dose in the morning which could be causing drowsiness.VSS.patient declined ED evaluation.request dilantin level and PT/ INR to be drawn.Lab orders given.please follow up.  ?

## 2021-09-25 DIAGNOSIS — Z7901 Long term (current) use of anticoagulants: Secondary | ICD-10-CM | POA: Diagnosis not present

## 2021-09-25 DIAGNOSIS — G40911 Epilepsy, unspecified, intractable, with status epilepticus: Secondary | ICD-10-CM | POA: Diagnosis not present

## 2021-09-25 LAB — COMPREHENSIVE METABOLIC PANEL: Calcium: 9.1 (ref 8.7–10.7)

## 2021-09-25 LAB — BASIC METABOLIC PANEL
BUN: 12 (ref 4–21)
CO2: 25 — AB (ref 13–22)
Chloride: 101 (ref 99–108)
Creatinine: 0.7 (ref 0.6–1.3)
Glucose: 77
Potassium: 4.7 mEq/L (ref 3.5–5.1)
Sodium: 140 (ref 137–147)

## 2021-09-25 LAB — POCT INR: INR: 2.48 — AB (ref 0.80–1.20)

## 2021-09-26 ENCOUNTER — Telehealth: Payer: Self-pay | Admitting: Neurology

## 2021-09-26 NOTE — Telephone Encounter (Signed)
I returned the call to [employer]. Left message, with the information below, on the nurse manager voicemail. I will also fax this information to [place] at [phone]. ?

## 2021-09-26 NOTE — Telephone Encounter (Signed)
No adjustment needed. Please continue current medications  ? ?Thanks  ?Dr. April Manson

## 2021-09-26 NOTE — Telephone Encounter (Signed)
Amber from [employer] called stating that she has faxed over the pt's Dilantin level's and she is wanting to know if they should adjust his medication. Please advise.  ?

## 2021-09-26 NOTE — Telephone Encounter (Signed)
Patient had labs drawn 09/25/21.  ? ?Phenytoin level at 21.1.  ? ?I confirmed with the nurse manager, Museum/gallery conservator, that he is taking the following: ? ?1) phenytoin '50mg'$ , 1.5 tabs in am ?2) phenytoin ER '100mg'$ , 3 tabs in pm ? ?She would like to know if adjustments need to be made to his dosage.  ?

## 2021-10-22 ENCOUNTER — Other Ambulatory Visit: Payer: Self-pay | Admitting: *Deleted

## 2021-10-22 DIAGNOSIS — G40909 Epilepsy, unspecified, not intractable, without status epilepticus: Secondary | ICD-10-CM

## 2021-10-22 DIAGNOSIS — R441 Visual hallucinations: Secondary | ICD-10-CM

## 2021-10-22 MED ORDER — LORAZEPAM 2 MG/ML PO CONC: 1.0000 mg | Freq: Every day | ORAL | 0 refills | Status: DC | PRN

## 2021-10-22 NOTE — Telephone Encounter (Signed)
[facility] requested refill: Pended Rx and sent to Christy for approval.    

## 2021-10-24 DIAGNOSIS — R059 Cough, unspecified: Secondary | ICD-10-CM | POA: Diagnosis not present

## 2021-10-24 DIAGNOSIS — Z20822 Contact with and (suspected) exposure to covid-19: Secondary | ICD-10-CM | POA: Diagnosis not present

## 2021-10-24 DIAGNOSIS — R051 Acute cough: Secondary | ICD-10-CM | POA: Diagnosis not present

## 2021-10-25 DIAGNOSIS — Z7901 Long term (current) use of anticoagulants: Secondary | ICD-10-CM | POA: Diagnosis not present

## 2021-10-25 DIAGNOSIS — E871 Hypo-osmolality and hyponatremia: Secondary | ICD-10-CM | POA: Diagnosis not present

## 2021-11-16 ENCOUNTER — Encounter: Payer: Self-pay | Admitting: Adult Health

## 2021-11-16 ENCOUNTER — Telehealth: Payer: Self-pay | Admitting: Adult Health

## 2021-11-16 DIAGNOSIS — E871 Hypo-osmolality and hyponatremia: Secondary | ICD-10-CM | POA: Diagnosis not present

## 2021-11-16 DIAGNOSIS — Z5181 Encounter for therapeutic drug level monitoring: Secondary | ICD-10-CM | POA: Diagnosis not present

## 2021-11-16 LAB — PROTIME-INR: Protime: 21.7 — AB (ref 10.0–13.8)

## 2021-11-16 LAB — BASIC METABOLIC PANEL
BUN: 14 (ref 4–21)
CO2: 26 — AB (ref 13–22)
Chloride: 101 (ref 99–108)
Creatinine: 0.9 (ref 0.6–1.3)
Glucose: 109
Potassium: 4.6 mEq/L (ref 3.5–5.1)
Sodium: 134 — AB (ref 137–147)

## 2021-11-16 LAB — COMPREHENSIVE METABOLIC PANEL
Calcium: 8.4 — AB (ref 8.7–10.7)
eGFR: 63

## 2021-11-16 LAB — POCT INR: INR: 2.14 — AB (ref 0.80–1.20)

## 2021-11-16 NOTE — Progress Notes (Signed)
This encounter was created in error - please disregard.

## 2021-11-16 NOTE — Telephone Encounter (Signed)
Nurse called to report lab results. Pt has hx of seizures and had a seizure 6/1.  He received Ativan and is back at baseline. Did take sodium tablet with some nausea this evening. BMP was acceptable NA 134.  PT INR 21.7/2.14 Dilantin level 16.1. Order given for INR in one month.  Continue to monitor resident

## 2021-11-19 ENCOUNTER — Non-Acute Institutional Stay: Payer: Medicare Other | Admitting: Adult Health

## 2021-11-19 DIAGNOSIS — G40919 Epilepsy, unspecified, intractable, without status epilepticus: Secondary | ICD-10-CM

## 2021-11-19 DIAGNOSIS — Z9689 Presence of other specified functional implants: Secondary | ICD-10-CM

## 2021-11-19 DIAGNOSIS — I951 Orthostatic hypotension: Secondary | ICD-10-CM | POA: Diagnosis not present

## 2021-11-19 DIAGNOSIS — R269 Unspecified abnormalities of gait and mobility: Secondary | ICD-10-CM | POA: Diagnosis not present

## 2021-11-19 DIAGNOSIS — I482 Chronic atrial fibrillation, unspecified: Secondary | ICD-10-CM | POA: Diagnosis not present

## 2021-11-19 DIAGNOSIS — L111 Transient acantholytic dermatosis [Grover]: Secondary | ICD-10-CM | POA: Diagnosis not present

## 2021-11-19 DIAGNOSIS — E871 Hypo-osmolality and hyponatremia: Secondary | ICD-10-CM | POA: Diagnosis not present

## 2021-11-19 DIAGNOSIS — G20 Parkinson's disease: Secondary | ICD-10-CM | POA: Diagnosis not present

## 2021-11-19 NOTE — Progress Notes (Unsigned)
Location:  [facility].  Provider:  Peggye Ley, ANP [facility] [phone]   Code Status: DNR Goals of Care:     01/17/2021    8:59 AM  Advanced Directives  Does Patient Have a Medical Advance Directive? Yes  Type of Advance Directive Living will;Out of facility DNR (pink MOST or yellow form)  Does patient want to make changes to medical advance directive? No - Patient declined  Pre-existing out of facility DNR order (yellow form or pink MOST form) Yellow form placed in chart (order not valid for inpatient use)     Chief Complaint  Patient presents with   Acute Visit    seizure    HPI: Patient is a 86 y.o. male seen today for an acute visit for a possible seizure and medical management of chronic diseases.   Staff in assisted living reported a seizure on 11/15/21.  He was given ativan. Symptoms that led them to believe it was a seizure were not documented. Pt was documented as being aware and able to communicate. He states he did not have a seizure he felt like he was passing out due to his hx of orthostatic hypotension and pulled the emergency button. He does typically know when he is starting to have a seizure due to an aura. Last seen by neurology 09/13/21 and they increased Vimpat due to reported breakthrough seizure. Labs were done after the seizure on 6/1 Dilantin level was 16.1, NA 134, INR 2.14.  No other acute changes. He has a vagal nerve stimulator and a ICD. Hx of craniotomy.  Parkinsonian related orthostatic hypotension: Orthostatic BP and pulse.  Sitting: 143/82 P 75 Sitting: 107/67 P 66  On coumadin due to a hx of afib  Hx of grover's disease, some rash to his feet. Clobetasol being used by pt  Hyponatremia: on sodium tabs and fluid restriction  Gait abnormality: uses a walker, falls frequently   Reports regular BMs with miralax Past Medical History:  Diagnosis Date   Abnormality of gait 12/21/2013   Atrial fibrillation (HCC)     Blind left eye    Carcinoma in situ of prostate    Cardiac arrest (HCC)    Glaucoma    Hypercholesteremia    Hyperlipidemia    Metabolic encephalopathy    Mild left ventricular systolic dysfunction    Presence of other specified functional implants    PSA elevation    Right frontal lobe lesion    Seizures (HCC)    Tinea pedis    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grover)    Urinary urgency     Past Surgical History:  Procedure Laterality Date   CARDIAC DEFIBRILLATOR PLACEMENT     cataract surgery     COLONOSCOPY     CRANIOTOMY Right    right anterior temporal resection   defibrillator replaced  March 2016   IMPLANTATION VAGAL NERVE STIMULATOR  June 2016   NASAL SINUS SURGERY     TONSILLECTOMY     age 47   VASECTOMY  70    Allergies  Allergen Reactions   Depakote [Divalproex Sodium] Other (See Comments)    Arthralgias     Outpatient Encounter Medications as of 11/19/2021  Medication Sig   ALPRAZolam (XANAX) 0.25 MG tablet Take 1 tablet (0.25 mg total) by mouth 2 (two) times daily.   brimonidine-timolol (COMBIGAN) 0.2-0.5 % ophthalmic solution Place 1 drop into both eyes every 12 (twelve) hours.    cholecalciferol (VITAMIN D) 400 units  TABS tablet Take 400 Units by mouth 2 (two) times daily.   clobetasol cream (TEMOVATE) 0.05 % Apply 1 application topically 2 (two) times daily as needed (For Flares).   finasteride (PROSCAR) 5 MG tablet Take 5 mg by mouth every morning.    flecainide (TAMBOCOR) 50 MG tablet Take 50 mg by mouth 2 (two) times daily.   latanoprost (XALATAN) 0.005 % ophthalmic solution Place 1 drop into both eyes at bedtime.   Levetiracetam 750 MG TB24 Take 2,250 mg by mouth daily. Take three tabs daily at 6pm   LORazepam (ATIVAN) 2 MG/ML concentrated solution Give 2 mg IM for seizure.If continued seizure activity after 15 minutes, give 2 mg again as needed.   LORazepam (LORAZEPAM INTENSOL) 2 MG/ML concentrated solution Take 0.5 mLs (1 mg total) by mouth  daily as needed (postictal anxiety).   phenytoin (DILANTIN) 100 MG ER capsule Take 300 mg by mouth at bedtime. Special instruction: Dilantin 100 mg take 3 capsules (300) mg in the evening. Once an evening 06:30   phenytoin (DILANTIN) 50 MG tablet Chew 75 mg by mouth daily. Chew 1.5 tablets (75 mg) by mouth daily one a morning (6:30) am   polyethylene glycol (MIRALAX / GLYCOLAX) packet Take 17 g by mouth See admin instructions. Once daily every other day and as needed for constipation   polyethylene glycol powder (GLYCOLAX/MIRALAX) 17 GM/SCOOP powder Take by mouth.   sodium chloride 1 g tablet Take 1 tablet (1 g total) by mouth 2 (two) times daily with a meal.   warfarin (COUMADIN) 2.5 MG tablet Take 1 tablet (2.5 mg total) by mouth daily at 12 noon. (Patient taking differently: Take 2.5 mg by mouth daily at 12 noon. 2.5 mg qd except 2 mg on wednesday)   No facility-administered encounter medications on file as of 11/19/2021.    Review of Systems:  Review of Systems  Constitutional:  Negative for activity change, appetite change, chills, diaphoresis, fatigue, fever and unexpected weight change.  Eyes:  Negative for visual disturbance.  Respiratory:  Negative for cough, shortness of breath, wheezing and stridor.   Cardiovascular:  Negative for chest pain, palpitations and leg swelling.  Gastrointestinal:  Negative for abdominal distention, abdominal pain, constipation and diarrhea.  Endocrine: Negative for polyphagia and polyuria.  Genitourinary:  Negative for difficulty urinating and dysuria.  Musculoskeletal:  Positive for gait problem. Negative for arthralgias, back pain, joint swelling and myalgias.  Skin:  Positive for rash.  Neurological:  Positive for seizures. Negative for dizziness, syncope, facial asymmetry, speech difficulty, weakness and headaches.  Hematological:  Negative for adenopathy. Does not bruise/bleed easily.  Psychiatric/Behavioral:  Negative for agitation, behavioral  problems and confusion.    Health Maintenance  Topic Date Due   Zoster Vaccines- Shingrix (2 of 2) 01/29/2017   COVID-19 Vaccine (4 - Booster for Moderna series) 05/23/2021   INFLUENZA VACCINE  01/15/2022   TETANUS/TDAP  09/18/2030   Pneumonia Vaccine 43+ Years old  Completed   HPV VACCINES  Aged Out    Physical Exam: Vitals:   11/20/21 0719  BP: 137/84  Pulse: 71  Resp: 19  Temp: (!) 97.3 F (36.3 C)  SpO2: 98%  Weight: 173 lb 6.4 oz (78.7 kg)   Body mass index is 26.37 kg/m. Physical Exam Vitals and nursing note reviewed.  Constitutional:      General: He is not in acute distress.    Appearance: He is not diaphoretic.  HENT:     Head: Normocephalic and atraumatic.  Mouth/Throat:     Mouth: Mucous membranes are moist.     Pharynx: Oropharynx is clear.  Neck:     Thyroid: No thyromegaly.     Vascular: No JVD.     Trachea: No tracheal deviation.  Cardiovascular:     Rate and Rhythm: Normal rate. Rhythm irregular.     Heart sounds: No murmur heard. Pulmonary:     Effort: Pulmonary effort is normal. No respiratory distress.     Breath sounds: Normal breath sounds. No wheezing.  Abdominal:     General: Bowel sounds are normal. There is no distension.     Palpations: Abdomen is soft.     Tenderness: There is no abdominal tenderness.  Musculoskeletal:     Right lower leg: No edema.     Left lower leg: No edema.  Lymphadenopathy:     Cervical: No cervical adenopathy.  Skin:    General: Skin is warm and dry.     Findings: Erythema (both feet) present.  Neurological:     Mental Status: He is alert and oriented to person, place, and time.     Cranial Nerves: No cranial nerve deficit.    Labs reviewed: Basic Metabolic Panel: Recent Labs    05/15/21 0000 07/30/21 0000 09/25/21 0000  NA 136* 142 140  K 4.5 4.8 4.7  CL 99 104 101  CO2 22 22 25*  BUN 14 14 12   CREATININE 1.0 0.7 0.7  CALCIUM 8.9 9.3 9.1   Liver Function Tests: No results for input(s):  AST, ALT, ALKPHOS, BILITOT, PROT, ALBUMIN in the last 8760 hours. No results for input(s): LIPASE, AMYLASE in the last 8760 hours. No results for input(s): AMMONIA in the last 8760 hours. CBC: Recent Labs    05/15/21 0000  WBC 6.2  HGB 12.6*  HCT 39*  PLT 256   Lipid Panel: Recent Labs    05/15/21 0000  CHOL 214*  HDL 61  LDLCALC 141  TRIG 79   No results found for: HGBA1C  Procedures since last visit: No results found.  Assessment/Plan  1. Refractory epilepsy (HCC) He does not feel this was a seizure. Symptoms not recorded for me to review. He did receive ativan and is back to baseline.  Continue current meds and f/u with neurology Dilatin level ok    2. Chronic atrial fibrillation Rate is controlled  INR therapeutic Does not like to have his INR drawn frequently   3. Parkinsonian syndrome associated with symptomatic orthostatic hypotension (HCC) Does have orthostatic bp On flomax which seems to be necessary  Refused meds for this problem He is a DNR, wants to avoid aggressive care.   4. Grover's disease Chronic, continue prn clobetasol and clotrimazole  5. Status post VNS (vagus nerve stimulator) placement noted  6. Hyponatremia Continue FR and sodium tablets  7. Abnormality of gait Encourage walker use at all times.    Labs/tests ordered:  * No order type specified * INR in one month Had CBC BMP INR and Dilantin level which need to abstracted  Next appt: 3 months    Total time :  time greater than 50% of total time spent doing pt counseling and coordination of care

## 2021-11-20 ENCOUNTER — Encounter: Payer: Self-pay | Admitting: Adult Health

## 2021-11-23 ENCOUNTER — Encounter: Payer: Self-pay | Admitting: Adult Health

## 2021-11-23 ENCOUNTER — Non-Acute Institutional Stay: Payer: Medicare Other | Admitting: Adult Health

## 2021-11-23 DIAGNOSIS — L03115 Cellulitis of right lower limb: Secondary | ICD-10-CM

## 2021-11-23 DIAGNOSIS — R451 Restlessness and agitation: Secondary | ICD-10-CM | POA: Diagnosis not present

## 2021-11-23 DIAGNOSIS — I482 Chronic atrial fibrillation, unspecified: Secondary | ICD-10-CM

## 2021-11-23 MED ORDER — ALPRAZOLAM 0.25 MG PO TABS: 0.2500 mg | ORAL_TABLET | Freq: Two times a day (BID) | ORAL | 5 refills | Status: DC

## 2021-11-23 MED ORDER — CEPHALEXIN 500 MG PO CAPS: 500.0000 mg | ORAL_CAPSULE | Freq: Three times a day (TID) | ORAL | 0 refills | Status: AC

## 2021-11-23 NOTE — Progress Notes (Signed)
Location:  [facility] Service:  ALF (13) Provider:   Cindi Carbon, [place] [phone]   Cameron Dad, MD  Patient Care Team: Cameron Dad, MD as PCP - General (Internal Medicine) Rutherford Guys, MD as Consulting Physician (Ophthalmology) Kathrynn Ducking, MD (Inactive) as Consulting Physician (Neurology) Gayland Curry, DO (Geriatric Medicine)  Extended Emergency Contact Information Primary Emergency Contact: Potts,Cameron Address: [address] [place]          [place] [address] Cameron Potts of [place] Phone: [phone] Mobile Phone: [phone] Relation: Spouse Secondary Emergency Contact: Potts,Cameron Mobile Phone: [phone] Relation: Son  Code Status:   Goals of care: Advanced Directive information    01/17/2021    8:59 AM  Advanced Directives  Does Patient Have a Medical Advance Directive? Yes  Type of Advance Directive Living will;Out of facility DNR (pink MOST or yellow form)  Does patient want to make changes to medical advance directive? No - Patient declined  Pre-existing out of facility DNR order (yellow form or pink MOST form) Yellow form placed in chart (order not valid for inpatient use)     Chief Complaint  Patient presents with   Acute Visit    RLE Cellulitis     HPI:  Pt is a 86 y.o. male seen today for an acute visit for RLE cellulitis He reports he "stubbed" his right second toe and it had been sore. Then later he noticed redness and swelling of his foot. He was placed on Keflex on 6/7.  He has received two days of treatment. He is not having a fever. No drainage. He feels fine. No pain, nausea, or weakness. He is on coumadin for CVA risk reduction.  Past Medical History:  Diagnosis Date   Abnormality of gait 12/21/2013   Atrial fibrillation (HCC)    Blind left eye    Carcinoma in situ of prostate    Cardiac arrest ([place])    Glaucoma    Hypercholesteremia     Hyperlipidemia    Metabolic encephalopathy    Mild left ventricular systolic dysfunction    Presence of other specified functional implants    PSA elevation    Right frontal lobe lesion    Seizures (HCC)    Tinea pedis    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grover)    Urinary urgency    Past Surgical History:  Procedure Laterality Date   CARDIAC DEFIBRILLATOR PLACEMENT     cataract surgery     COLONOSCOPY     CRANIOTOMY Right    right anterior temporal resection   defibrillator replaced  March 2016   IMPLANTATION VAGAL NERVE STIMULATOR  June 2016   NASAL SINUS SURGERY     TONSILLECTOMY     age 25   VASECTOMY  49    Allergies  Allergen Reactions   Depakote [Divalproex Sodium] Other (See Comments)    Arthralgias     Outpatient Encounter Medications as of 11/23/2021  Medication Sig   cephALEXin (KEFLEX) 500 MG capsule Take 1 capsule (500 mg total) by mouth 3 (three) times daily for 7 days.   ALPRAZolam (XANAX) 0.25 MG tablet Take 1 tablet (0.25 mg total) by mouth 2 (two) times daily.   brimonidine-timolol (COMBIGAN) 0.2-0.5 % ophthalmic solution Place 1 drop into both eyes every 12 (twelve) hours.    cholecalciferol (VITAMIN D) 400 units TABS tablet Take 400 Units by mouth 2 (two) times daily.   clobetasol cream (  TEMOVATE) 0.09 % Apply 1 application topically 2 (two) times daily as needed (For Flares).   finasteride (PROSCAR) 5 MG tablet Take 5 mg by mouth every morning.    flecainide (TAMBOCOR) 50 MG tablet Take 50 mg by mouth 2 (two) times daily.   lacosamide (VIMPAT) 200 MG TABS tablet Take 1 tablet (200 mg total) by mouth 2 (two) times daily.   latanoprost (XALATAN) 0.005 % ophthalmic solution Place 1 drop into both eyes at bedtime.   Levetiracetam 750 MG TB24 Take 2,250 mg by mouth daily. Take three tabs daily at 6pm   LORazepam (ATIVAN) 2 MG/ML concentrated solution Give 2 mg IM for seizure.If continued seizure activity after 15 minutes, give 2 mg again as needed.    LORazepam (LORAZEPAM INTENSOL) 2 MG/ML concentrated solution Take 0.5 mLs (1 mg total) by mouth daily as needed (postictal anxiety).   phenytoin (DILANTIN) 100 MG ER capsule Take 300 mg by mouth at bedtime. Special instruction: Dilantin 100 mg take 3 capsules (300) mg in the evening. Once an evening 06:30   phenytoin (DILANTIN) 50 MG tablet Chew 75 mg by mouth daily. Chew 1.5 tablets (75 mg) by mouth daily one a morning (6:30) am   polyethylene glycol (MIRALAX / GLYCOLAX) packet Take 17 g by mouth See admin instructions. Once daily every other day and as needed for constipation   polyethylene glycol powder (GLYCOLAX/MIRALAX) 17 GM/SCOOP powder Take by mouth.   sodium chloride 1 g tablet Take 1 tablet (1 g total) by mouth 2 (two) times daily with a meal.   warfarin (COUMADIN) 2.5 MG tablet Take 1 tablet (2.5 mg total) by mouth daily at 12 noon. (Patient taking differently: Take 2.5 mg by mouth daily at 12 noon. 2.5 mg qd except 2 mg on wednesday)   [DISCONTINUED] ALPRAZolam (XANAX) 0.25 MG tablet Take 1 tablet (0.25 mg total) by mouth 2 (two) times daily.   No facility-administered encounter medications on file as of 11/23/2021.    Review of Systems  Constitutional:  Negative for activity change, appetite change, chills, diaphoresis, fatigue, fever and unexpected weight change.  Respiratory:  Negative for cough, shortness of breath and wheezing.   Cardiovascular:  Positive for leg swelling. Negative for chest pain and palpitations.  Skin:  Positive for color change.    Immunization History  Administered Date(s) Administered   Influenza, High Dose Seasonal PF 04/07/2020   Influenza, Seasonal, Injecte, Preservative Fre 02/22/2010   Influenza,inj,Quad PF,6+ Mos 04/07/2018, 04/15/2019   Influenza-Unspecified 03/17/2013, 02/24/2014, 02/16/2015, 03/21/2016, 03/17/2017, 04/07/2018, 03/21/2021   Moderna SARS-COV2 Booster Vaccination 03/28/2021   Moderna Sars-Covid-2 Vaccination 06/28/2019, 07/27/2019,  05/01/2020   Pneumococcal Conjugate-13 07/05/2013   Pneumococcal Polysaccharide-23 06/17/2006   Td 06/18/2007   Tdap 12/07/2015, 09/17/2020   Zoster Recombinat (Shingrix) 12/04/2016   Zoster, Live 10/06/2008   Pertinent  Health Maintenance Due  Topic Date Due   INFLUENZA VACCINE  01/15/2022      03/15/2020   11:55 AM 07/05/2020   10:14 AM 09/17/2020    8:39 AM 01/17/2021    8:57 AM 04/23/2021    1:07 PM  Fall Risk  Falls in the past year? 0 '1  1 1  '$ Was there an injury with Fall? 0 '1  1 1  '$ Fall Risk Category Calculator 0 '3  3 3  '$ Fall Risk Category Low High  High High  Patient Fall Risk Level Low fall risk Low fall risk High fall risk High fall risk High fall risk  Patient at Risk for  Falls Due to    Impaired mobility   Fall risk Follow up    Falls evaluation completed Falls evaluation completed   Functional Status Survey:    Vitals:   11/23/21 1126  BP: (!) 143/83  Pulse: 74  Resp: 20  Temp: (!) 97.2 F (36.2 C)   There is no height or weight on file to calculate BMI. Physical Exam Vitals and nursing note reviewed.  Constitutional:      Appearance: Normal appearance.  Cardiovascular:     Rate and Rhythm: Normal rate and regular rhythm.  Pulmonary:     Effort: Pulmonary effort is normal.     Breath sounds: Normal breath sounds.  Musculoskeletal:     Right lower leg: Edema (+1) present.     Left lower leg: No edema.  Skin:    General: Skin is warm and dry.     Findings: Erythema (and warmth at the mid foot and ankle area. no draiange. no tenderness.) present.  Neurological:     General: No focal deficit present.     Mental Status: He is alert. Mental status is at baseline.     Labs reviewed: Recent Labs    07/30/21 0000 09/25/21 0000 11/16/21 1627  NA 142 140 134*  K 4.8 4.7 4.6  CL 104 101 101  CO2 22 25* 26*  BUN '14 12 14  '$ CREATININE 0.7 0.7 0.9  CALCIUM 9.3 9.1 8.4*   No results for input(s): "AST", "ALT", "ALKPHOS", "BILITOT", "PROT", "ALBUMIN" in  the last 8760 hours. Recent Labs    05/15/21 0000  WBC 6.2  HGB 12.6*  HCT 39*  PLT 256   No results found for: "TSH" No results found for: "HGBA1C" Lab Results  Component Value Date   CHOL 214 (A) 05/15/2021   HDL 61 05/15/2021   LDLCALC 141 05/15/2021   TRIG 79 05/15/2021    Significant Diagnostic Results in last 30 days:  No results found.  Assessment/Plan 1. Agitation And seizures, script sent  - ALPRAZolam (XANAX) 0.25 MG tablet; Take 1 tablet (0.25 mg total) by mouth 2 (two) times daily.  Dispense: 60 tablet; Refill: 5  2. Cellulitis of right lower extremity He has received 2 days of a 7 day course of Keflex. He is not systemically ill and does not have purulent drainage. Remains with redness and swelling. Would continue Keflex and f/u with provider next week.   3. Chronic afib On coumadin Lab Results  Component Value Date   INR 2.14 (A) 11/16/2021   INR 2.48 (A) 09/25/2021   INR 2.33 (A) 07/30/2021   PROTIME 21.7 (A) 11/16/2021   PROTIME 25.2 (A) 08/08/2020   PROTIME 25.5 (A) 11/24/2018      Family/ staff Communication: resident and nurse Amber   Labs/tests ordered:  INR    Total time 59mn:  time greater than 50% of total time spent doing pt counseling and coordination of care

## 2022-01-02 DIAGNOSIS — B353 Tinea pedis: Secondary | ICD-10-CM | POA: Diagnosis not present

## 2022-01-02 DIAGNOSIS — L570 Actinic keratosis: Secondary | ICD-10-CM | POA: Diagnosis not present

## 2022-01-02 DIAGNOSIS — D692 Other nonthrombocytopenic purpura: Secondary | ICD-10-CM | POA: Diagnosis not present

## 2022-01-02 DIAGNOSIS — Z85820 Personal history of malignant melanoma of skin: Secondary | ICD-10-CM | POA: Diagnosis not present

## 2022-01-02 DIAGNOSIS — L111 Transient acantholytic dermatosis [Grover]: Secondary | ICD-10-CM | POA: Diagnosis not present

## 2022-01-02 DIAGNOSIS — L579 Skin changes due to chronic exposure to nonionizing radiation, unspecified: Secondary | ICD-10-CM | POA: Diagnosis not present

## 2022-01-14 ENCOUNTER — Emergency Department (HOSPITAL_COMMUNITY): Payer: Medicare Other

## 2022-01-14 ENCOUNTER — Emergency Department (HOSPITAL_COMMUNITY)
Admission: EM | Admit: 2022-01-14 | Discharge: 2022-01-14 | Disposition: A | Discharge status: Home / Self Care | Payer: Medicare Other | Attending: Emergency Medicine | Admitting: Emergency Medicine

## 2022-01-14 ENCOUNTER — Other Ambulatory Visit: Payer: Self-pay

## 2022-01-14 ENCOUNTER — Encounter (HOSPITAL_COMMUNITY): Payer: Self-pay

## 2022-01-14 DIAGNOSIS — I4891 Unspecified atrial fibrillation: Secondary | ICD-10-CM | POA: Diagnosis not present

## 2022-01-14 DIAGNOSIS — J3489 Other specified disorders of nose and nasal sinuses: Secondary | ICD-10-CM | POA: Diagnosis not present

## 2022-01-14 DIAGNOSIS — Z79899 Other long term (current) drug therapy: Secondary | ICD-10-CM | POA: Diagnosis not present

## 2022-01-14 DIAGNOSIS — I517 Cardiomegaly: Secondary | ICD-10-CM | POA: Diagnosis not present

## 2022-01-14 DIAGNOSIS — R911 Solitary pulmonary nodule: Secondary | ICD-10-CM | POA: Diagnosis not present

## 2022-01-14 DIAGNOSIS — Z7901 Long term (current) use of anticoagulants: Secondary | ICD-10-CM | POA: Diagnosis not present

## 2022-01-14 DIAGNOSIS — Z955 Presence of coronary angioplasty implant and graft: Secondary | ICD-10-CM | POA: Diagnosis not present

## 2022-01-14 DIAGNOSIS — W19XXXA Unspecified fall, initial encounter: Secondary | ICD-10-CM | POA: Insufficient documentation

## 2022-01-14 DIAGNOSIS — R4781 Slurred speech: Secondary | ICD-10-CM | POA: Diagnosis not present

## 2022-01-14 DIAGNOSIS — I639 Cerebral infarction, unspecified: Secondary | ICD-10-CM | POA: Diagnosis not present

## 2022-01-14 DIAGNOSIS — J984 Other disorders of lung: Secondary | ICD-10-CM | POA: Insufficient documentation

## 2022-01-14 DIAGNOSIS — S0990XA Unspecified injury of head, initial encounter: Secondary | ICD-10-CM | POA: Diagnosis not present

## 2022-01-14 DIAGNOSIS — R55 Syncope and collapse: Secondary | ICD-10-CM | POA: Diagnosis not present

## 2022-01-14 DIAGNOSIS — R58 Hemorrhage, not elsewhere classified: Secondary | ICD-10-CM | POA: Diagnosis not present

## 2022-01-14 DIAGNOSIS — S0101XA Laceration without foreign body of scalp, initial encounter: Secondary | ICD-10-CM | POA: Diagnosis not present

## 2022-01-14 DIAGNOSIS — F0390 Unspecified dementia without behavioral disturbance: Secondary | ICD-10-CM | POA: Insufficient documentation

## 2022-01-14 DIAGNOSIS — S299XXA Unspecified injury of thorax, initial encounter: Secondary | ICD-10-CM | POA: Diagnosis not present

## 2022-01-14 DIAGNOSIS — Z95810 Presence of automatic (implantable) cardiac defibrillator: Secondary | ICD-10-CM | POA: Diagnosis not present

## 2022-01-14 DIAGNOSIS — G319 Degenerative disease of nervous system, unspecified: Secondary | ICD-10-CM | POA: Diagnosis not present

## 2022-01-14 LAB — CBC
HCT: 34.4 % — ABNORMAL LOW (ref 39.0–52.0)
Hemoglobin: 12.1 g/dL — ABNORMAL LOW (ref 13.0–17.0)
MCH: 32.7 pg (ref 26.0–34.0)
MCHC: 35.2 g/dL (ref 30.0–36.0)
MCV: 93 fL (ref 80.0–100.0)
Platelets: 202 10*3/uL (ref 150–400)
RBC: 3.7 MIL/uL — ABNORMAL LOW (ref 4.22–5.81)
RDW: 12.4 % (ref 11.5–15.5)
WBC: 6.6 10*3/uL (ref 4.0–10.5)
nRBC: 0 % (ref 0.0–0.2)

## 2022-01-14 LAB — PROTIME-INR
INR: 2.7 — ABNORMAL HIGH (ref 0.8–1.2)
Prothrombin Time: 28.4 seconds — ABNORMAL HIGH (ref 11.4–15.2)

## 2022-01-14 LAB — BASIC METABOLIC PANEL
Anion gap: 8 (ref 5–15)
BUN: 10 mg/dL (ref 8–23)
CO2: 22 mmol/L (ref 22–32)
Calcium: 8.6 mg/dL — ABNORMAL LOW (ref 8.9–10.3)
Chloride: 99 mmol/L (ref 98–111)
Creatinine, Ser: 0.81 mg/dL (ref 0.61–1.24)
GFR, Estimated: >60 mL/min (ref >60)
Glucose, Bld: 108 mg/dL — ABNORMAL HIGH (ref 70–99)
Potassium: 4.4 mmol/L (ref 3.5–5.1)
Sodium: 129 mmol/L — ABNORMAL LOW (ref 135–145)

## 2022-01-14 LAB — CBG MONITORING, ED: Glucose-Capillary: 116 mg/dL — ABNORMAL HIGH (ref 70–99)

## 2022-01-14 LAB — TROPONIN I (HIGH SENSITIVITY): Troponin I (High Sensitivity): 5 ng/L (ref <18)

## 2022-01-14 MED ORDER — SODIUM CHLORIDE 0.9 % IV BOLUS
500.0000 mL | Freq: Once | INTRAVENOUS | Status: AC
Start: 2022-01-14 — End: 2022-01-14
  Administered 2022-01-14: 500 mL via INTRAVENOUS

## 2022-01-14 NOTE — Progress Notes (Signed)
   01/14/22 0940  Clinical Encounter Type  Visited With Patient and family together  Visit Type ED;Initial  Referral From Nurse Benito Mccreedy, RN)  Consult/Referral To Chaplain Melvenia Beam)   Responded to page in E.D. Room 29 for Level 2 Trauma. Patient being evaluated and treated by medical staff at this time. Chaplain met with Mr. Siever and his grandson. Spiritual needs being met. Staff will page Chaplain upon request of patient or family.  Chaplain Bhavin Monjaraz, M.Min., [phone].

## 2022-01-14 NOTE — ED Notes (Signed)
Trauma Response Nurse Documentation  Cameron Potts is a 86 y.o. male arriving to [facility] ED via EMS  On Eliquis (apixaban) daily. Trauma was activated as a Level 2 based on the following trauma criteria Elderly patients > 65 with head trauma on anti-coagulation (excluding ASA). Trauma team at the bedside on patient arrival. Patient cleared for CT by Dr. Langston Masker. Patient to CT with team.  History   Past Medical History:  Diagnosis Date   Abnormality of gait 12/21/2013   Atrial fibrillation ([place])    Blind left eye    Carcinoma in situ of prostate    Cardiac arrest ([place])    Glaucoma    Hypercholesteremia    Hyperlipidemia    Metabolic encephalopathy    Mild left ventricular systolic dysfunction    Presence of other specified functional implants    PSA elevation    Right frontal lobe lesion    Seizures (HCC)    Tinea pedis    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grover)    Urinary urgency      Past Surgical History:  Procedure Laterality Date   CARDIAC DEFIBRILLATOR PLACEMENT     cataract surgery     COLONOSCOPY     CRANIOTOMY Right    right anterior temporal resection   defibrillator replaced  March 2016   IMPLANTATION VAGAL NERVE STIMULATOR  June 2016   NASAL SINUS SURGERY     TONSILLECTOMY     age 43   VASECTOMY  58     Initial Focused Assessment (If applicable, or please see trauma documentation): A&O, GCS 15 Unsure of events today leading to fall, states he has a 30 year seizure history but he did not have a seizure today Small laceration to posterior head with bleeding controlled  CT's Completed:   CT Head and CT Chest w/o contrast  Interventions:  IV, basic labs and medication levels CXR CT Head/Chest w/o contrast  Plan for disposition:  Discharge home   Event Summary: Patient from [place], unsure of events. Imaging negative for traumatic injury. Will most likely discharge back to [place].  Bedside handoff with ED RN Minette Brine.    Park Pope  Katielynn Horan  Trauma Response RN  Please call TRN at [phone] for further assistance.

## 2022-01-14 NOTE — ED Provider Notes (Signed)
[facility] EMERGENCY DEPARTMENT Provider Note   CSN: [id number] Arrival date & time: 01/14/22  4742     History  Chief Complaint  Patient presents with   level 2 fall    Cameron Potts is a 86 y.o. male with a history of intractable seizures, Dilantin, Vimpat and Keppra, history of breakthrough seizures, history of atrial fibrillation on Coumadin, presenting emergency department with concern for unwitnessed loss of consciousness episode.  The patient does have some dementia (mild) but overall is a good historian He repeats adamantly that he did not have a seizure and does not feel like he had a seizure.  He presents to the ED by EMS with a small laceration to the back of the scalp.  He says he feels fine.  Update - family and patient later confirmed he got lightheaded standing up off the toilet this morning, which has happened before, causing his fall.  No LOC.  He does have a vagal nerve stimulator implant.  He presents with advance directive form which demarcates that he would prefer comfort care measures, is DNR/DNI.  HPI     Home Medications Prior to Admission medications   Medication Sig Start Date End Date Taking? Authorizing Provider  ALPRAZolam (XANAX) 0.25 MG tablet Take 1 tablet (0.25 mg total) by mouth 2 (two) times daily. 11/23/21   Royal Hawthorn, NP  brimonidine-timolol (COMBIGAN) 0.2-0.5 % ophthalmic solution Place 1 drop into both eyes every 12 (twelve) hours.  01/05/18   [provider]  cholecalciferol (VITAMIN D) 400 units TABS tablet Take 400 Units by mouth 2 (two) times daily.    [provider]  clobetasol cream (TEMOVATE) 5.95 % Apply 1 application topically 2 (two) times daily as needed (For Flares).    [provider]  finasteride (PROSCAR) 5 MG tablet Take 5 mg by mouth every morning.  02/12/19   [provider]  flecainide (TAMBOCOR) 50 MG tablet Take 50 mg by mouth 2 (two) times daily.     [provider]  lacosamide (VIMPAT) 200 MG TABS tablet Take 1 tablet (200 mg total) by mouth 2 (two) times daily. 09/13/21 10/13/21  Alric Ran, MD  latanoprost (XALATAN) 0.005 % ophthalmic solution Place 1 drop into both eyes at bedtime.    [provider]  Levetiracetam 750 MG TB24 Take 2,250 mg by mouth daily. Take three tabs daily at 6pm    [provider]  LORazepam (ATIVAN) 2 MG/ML concentrated solution Give 2 mg IM for seizure.If continued seizure activity after 15 minutes, give 2 mg again as needed. 07/09/21   [name], MD  LORazepam (LORAZEPAM INTENSOL) 2 MG/ML concentrated solution Take 0.5 mLs (1 mg total) by mouth daily as needed (postictal anxiety). 10/22/21   Royal Hawthorn, NP  phenytoin (DILANTIN) 100 MG ER capsule Take 300 mg by mouth at bedtime. Special instruction: Dilantin 100 mg take 3 capsules (300) mg in the evening. Once an evening 06:30    [provider]  phenytoin (DILANTIN) 50 MG tablet Chew 75 mg by mouth daily. Chew 1.5 tablets (75 mg) by mouth daily one a morning (6:30) am    [provider]  polyethylene glycol (MIRALAX / GLYCOLAX) packet Take 17 g by mouth See admin instructions. Once daily every other day and as needed for constipation    [provider]  polyethylene glycol powder (GLYCOLAX/MIRALAX) 17 GM/SCOOP powder Take by mouth.    [provider]  sodium chloride 1 g  tablet Take 1 tablet (1 g total) by mouth 2 (two) times daily with a meal. 02/01/19   Monica Becton, MD  warfarin (COUMADIN) 2.5 MG tablet Take 1 tablet (2.5 mg total) by mouth daily at 12 noon. Patient taking differently: Take 2.5 mg by mouth daily at 12 noon. 2.5 mg qd except 2 mg on wednesday 02/12/19   Hollace Kinnier L, DO      Allergies    Depakote [divalproex sodium]    Review of Systems   Review of Systems  Physical Exam Updated Vital Signs BP 138/86   Pulse 61   Temp (!) 97.5 F (36.4 C) (Oral)   Resp 17    Ht '5\' 9"'$  (1.753 m)   Wt 81.2 kg   SpO2 100%   BMI 26.43 kg/m  Physical Exam Constitutional:      General: He is not in acute distress. HENT:     Head: Normocephalic.     Comments: Small superficial laceration to the back of the scalp Eyes:     Conjunctiva/sclera: Conjunctivae normal.     Pupils: Pupils are equal, round, and reactive to light.  Cardiovascular:     Rate and Rhythm: Normal rate and regular rhythm.  Pulmonary:     Effort: Pulmonary effort is normal. No respiratory distress.  Abdominal:     General: There is no distension.     Tenderness: There is no abdominal tenderness.  Skin:    General: Skin is warm and dry.  Neurological:     General: No focal deficit present.     Mental Status: He is alert. Mental status is at baseline.  Psychiatric:        Mood and Affect: Mood normal.        Behavior: Behavior normal.     ED Results / Procedures / Treatments   Labs (all labs ordered are listed, but only abnormal results are displayed) Labs Reviewed  BASIC METABOLIC PANEL - Abnormal; Notable for the following components:      Result Value   Sodium 129 (*)    Glucose, Bld 108 (*)    Calcium 8.6 (*)    All other components within normal limits  CBC - Abnormal; Notable for the following components:   RBC 3.70 (*)    Hemoglobin 12.1 (*)    HCT 34.4 (*)    All other components within normal limits  PROTIME-INR - Abnormal; Notable for the following components:   Prothrombin Time 28.4 (*)    INR 2.7 (*)    All other components within normal limits  CBG MONITORING, ED - Abnormal; Notable for the following components:   Glucose-Capillary 116 (*)    All other components within normal limits  PHENYTOIN LEVEL, FREE AND TOTAL  LEVETIRACETAM LEVEL  TROPONIN I (HIGH SENSITIVITY)  TROPONIN I (HIGH SENSITIVITY)    EKG EKG Interpretation  Date/Time:  Monday January 14 2022 09:38:25 EDT Ventricular Rate:  60 PR Interval:  70 QRS Duration: 140 QT Interval:  499 QTC  Calculation: 499 R Axis:   50 Text Interpretation: Sinus rhythm Short PR interval Left bundle branch block Confirmed by Octaviano Glow [phone]) on 01/14/2022 9:40:58 AM  Radiology CT Chest Wo Contrast  Result Date: 01/14/2022 CLINICAL DATA:  Pneumonia, complication suspected, xray done EXAM: CT CHEST WITHOUT CONTRAST TECHNIQUE: Multidetector CT imaging of the chest was performed following the standard protocol without IV contrast. RADIATION DOSE REDUCTION: This exam was performed according to the departmental dose-optimization program which includes automated exposure control,  adjustment of the mA and/or kV according to patient size and/or use of iterative reconstruction technique. COMPARISON:  Correlation is made with a chest x-ray done on the same day earlier FINDINGS: Cardiovascular: Mild atheromatous calcifications of the thoracic aorta. No aneurysm. Severe atheromatous no significant vascular findings. Normal heart size. No pericardial effusion. Mediastinum/Nodes: Hypodense lesion in the right thyroid lobe measuring 2.2 x 1.6 cm. Left thyroid lobe has a normal appearance. No mediastinal mass or significant lymphadenopathy. No significant axillary lymphadenopathy. Small hiatal hernia. Lungs/Pleura: No focal consolidation, pleural effusion or vascular congestion. There are some interstitial changes seen with a subpleural reticulations at the bibasilar regions. There is band of chronic atelectasis/pulmonary scarring seen at the left lung base. Upper Abdomen: No acute abnormality. Musculoskeletal: Again seen is the left subclavian pacer defibrillator. There is a nerve stimulator at the right side of the chest. Mild-to-moderate spondylosis of the cervical and thoracic spine. IMPRESSION: 1. No focal consolidation, pleural effusion or vascular congestion. Interstitial changes at the lung bases. There is a band of atelectasis/scarring at the left lung base. 2. Mild cardiomegaly. Severe atheromatous calcifications  of the coronary arteries. 3.  2.2 x 1.6 cm hypodense lesion in the right thyroid lobe. Pathology: 2.2 cm incidental right thyroid nodule. Recommend thyroid US.Reference: J Am Coll Radiol. 2015 Feb;12(2): 143-50 Electronically Signed   By: Frazier Richards M.D.   On: 01/14/2022 10:47   CT HEAD WO CONTRAST (5MM)  Result Date: 01/14/2022 CLINICAL DATA:  Head trauma.  Fall on Coumadin. EXAM: CT HEAD WITHOUT CONTRAST TECHNIQUE: Contiguous axial images were obtained from the base of the skull through the vertex without intravenous contrast. RADIATION DOSE REDUCTION: This exam was performed according to the departmental dose-optimization program which includes automated exposure control, adjustment of the mA and/or kV according to patient size and/or use of iterative reconstruction technique. COMPARISON:  Head CT 01/29/2019 FINDINGS: Brain: There is no evidence of an acute infarct, intracranial hemorrhage, mass, midline shift, or extra-axial fluid collection. Sequelae of anterior right temporal lobectomy are again identified. There is an unchanged small chronic infarct involving the right cingulate gyrus. Hypodensities elsewhere in the cerebral white matter bilaterally are unchanged and nonspecific but compatible with mild chronic small vessel ischemic disease. There is mild global cerebral atrophy. Vascular: Calcified atherosclerosis at the skull base. No hyperdense vessel. Skull: Right pterional craniotomy.  No acute fracture. Sinuses/Orbits: Mucosal thickening throughout the paranasal sinuses including subtotal opacification of the right maxillary sinus by circumferential mucosal thickening. Bubbly secretions in the left maxillary sinus. Small, chronic left mastoid effusion. Bilateral cataract extraction. Other: Mild right occipital scalp soft tissue swelling. IMPRESSION: 1. No evidence of acute intracranial abnormality. 2. Mild right occipital scalp soft tissue swelling. 3. Chronic brain findings as above.  Electronically Signed   By: Logan Bores M.D.   On: 01/14/2022 10:36   DG Chest Portable 1 View  Result Date: 01/14/2022 CLINICAL DATA:  86 year old male presenting for evaluation of fall, level 2 trauma with history of blood thinners EXAM: PORTABLE CHEST 1 VIEW COMPARISON:  September 03, 2018. FINDINGS: Nerve stimulator and cardiac pacer defibrillator in place over the chest. Leads extending cephalad from nerve stimulator. Dual lead pacer defibrillator with similar appearance to prior imaging. EKG leads project over the chest. Trachea midline. Cardiomediastinal contours and hilar structures are stable accounting for portable technique and AP projection. Subtle interstitial opacities and patchy opacities in the chest greatest in the LEFT mid and lower chest. No visible pneumothorax. No lobar consolidation. No gross pleural  effusion on frontal radiograph. On limited assessment no acute skeletal findings. IMPRESSION: Subtle interstitial opacities and patchy opacities in the chest greatest in the LEFT mid and lower chest. Findings may represent asymmetric edema or atypical infection. Electronically Signed   By: Zetta Bills M.D.   On: 01/14/2022 09:45    Procedures Procedures    Medications Ordered in ED Medications  sodium chloride 0.9 % bolus 500 mL (0 mLs Intravenous Stopped 01/14/22 1227)    ED Course/ Medical Decision Making/ A&P Clinical Course as of 01/14/22 1612  Mon Jan 14, 2022  1129 Patient's grandson now present at the bedside.  Discussed with the patient had LOC or near syncope when standing up getting off the toilet, which has happened before.  This sounds more like a vasovagal episode.  There was no witness seizure activity.  However, all the same, I am concerned that the patient remains on Coumadin for A-fib given history of intractable breakthrough seizures, very high risk for brain bleed, brain injury, and additional traumatic bleeding if he continues to have seizure episodes.  I  discussed with the patient and his grandson, and will provide this opinion in his discharge summary, we can share with his PCP.  However, I advised that the decision to stop long-term Coumadin you should be made by your primary care physician or a cardiologist, not by the ED provider.  They verbalized understanding and agreement with the plan. [MT]    Clinical Course User Index [MT] Tujuana Kilmartin, Carola Rhine, MD                           Medical Decision Making Amount and/or Complexity of Data Reviewed Labs: ordered. Radiology: ordered. ECG/medicine tests: ordered.   This patient presents to the ED with concern for syncope versus near syncope. This involves an extensive number of treatment options, and is a complaint that carries with it a high risk of complications and morbidity.  The differential diagnosis includes arrhythmia versus seizure versus mechanical fall (patient has dementia) versus anemia versus other medical condition  Co-morbidities that complicate the patient evaluation: coumadin use at higher risk for traumatic bleeding injuries  He was also on Coumadin for A-fib which is a larger bleeding risk.  Patient arrives as a level 2 trauma because of this.  Additional history obtained from EMS on arrival, grandson at bedside  External records from outside source obtained and reviewed including neurology office evaluations for intractable seizures, vagal nerve stimulator.  I ordered and personally interpreted labs.  The pertinent results include:  no emergent findings  I ordered imaging studies including CT of the head, x-ray of the chest I independently visualized and interpreted imaging which showed no emergent acute findings I agree with the radiologist interpretation  The patient was maintained on a cardiac monitor.  I personally viewed and interpreted the cardiac monitored which showed an underlying rhythm of: Regular heart rate  Per my interpretation the patient's ECG shows left  bundle branch block pattern, which is chronic, no acute ischemic findings  I have reviewed the patients home medicines and have made adjustments as needed  Test Considered: I do not see evidence of traumatic injury or fracture of the remaining extremities, or spine.   After the interventions noted above, I reevaluated the patient and found that they have: improved  Based on his history this does not seem consistent with breakthrough seizure.  I would not adjust his AED's at this time.  Hospitalization was considered but patient preferred discharge, and that is reasonable in this setting.  Dispostion:  After consideration of the diagnostic results and the patients response to treatment, I feel that the patent would benefit from close PCP f/u.         Final Clinical Impression(s) / ED Diagnoses Final diagnoses:  Injury of head, initial encounter  Near syncope    Rx / DC Orders ED Discharge Orders     None         Daric Koren, Carola Rhine, MD 01/14/22 [phone]

## 2022-01-14 NOTE — Progress Notes (Signed)
Orthopedic Tech Progress Note Patient Details:  Cameron Potts 09/19/1935 [id number]   Level 2 trauma    Patient ID: [name], male   DOB: [birth year], 86 y.o.   MRN: [id number]  Janit Pagan 01/14/2022, 9:25 AM

## 2022-01-14 NOTE — ED Notes (Signed)
Patient arrived by [facility] [place] with most form following fall. Patient arrived alert and oriented and unsure of events surrounding details of fall. Arrived with laceration to crown of scalp. Patient has hx of seizures but states that he knows he did not have a seizure. No bleeding from wound, denies pain.

## 2022-01-14 NOTE — Discharge Instructions (Addendum)
Please contact your primary care provider's office as soon as possible to arrange for follow-up appointment for your visit to the ER today.  *  Cameron Potts's blood tests and CT images were included.    We had a discussion about his continued Coumadin use for A-fib.  I think at his age, with a history of breakthrough seizures, he is a very high risk for traumatic bleeding and injuries, including brain bleeds, if he continues on Coumadin.  I advised him and his family to have a discussion with his primary care provider, or the doctor who prescribes and manages the Coumadin.  The risks and benefit discussion for Coumadin should be had with the doctor who is very familiar with his history, not the ER staff.  For now he will continue the Coumadin.

## 2022-01-15 ENCOUNTER — Non-Acute Institutional Stay: Payer: Medicare Other | Admitting: Orthopedic Surgery

## 2022-01-15 ENCOUNTER — Encounter: Payer: Self-pay | Admitting: Orthopedic Surgery

## 2022-01-15 DIAGNOSIS — R296 Repeated falls: Secondary | ICD-10-CM

## 2022-01-15 DIAGNOSIS — I951 Orthostatic hypotension: Secondary | ICD-10-CM | POA: Diagnosis not present

## 2022-01-15 DIAGNOSIS — I482 Chronic atrial fibrillation, unspecified: Secondary | ICD-10-CM | POA: Diagnosis not present

## 2022-01-15 DIAGNOSIS — G20 Parkinson's disease: Secondary | ICD-10-CM | POA: Diagnosis not present

## 2022-01-15 DIAGNOSIS — S0091XD Abrasion of unspecified part of head, subsequent encounter: Secondary | ICD-10-CM | POA: Diagnosis not present

## 2022-01-15 DIAGNOSIS — E041 Nontoxic single thyroid nodule: Secondary | ICD-10-CM | POA: Diagnosis not present

## 2022-01-15 DIAGNOSIS — G40919 Epilepsy, unspecified, intractable, without status epilepticus: Secondary | ICD-10-CM | POA: Diagnosis not present

## 2022-01-15 LAB — LEVETIRACETAM LEVEL: Levetiracetam Lvl: 33.9 ug/mL (ref 10.0–40.0)

## 2022-01-15 LAB — PHENYTOIN LEVEL, FREE AND TOTAL
Phenytoin, Free: 1.5 ug/mL (ref 1.0–2.0)
Phenytoin, Total: 20.9 ug/mL — ABNORMAL HIGH (ref 10.0–20.0)

## 2022-01-15 NOTE — Progress Notes (Signed)
Location:   [facility] Room Number: 629 Place of Service:  ALF [phone]) Provider:  Windell Moulding, NP  [name], MD  Patient Care Team: [name], MD as PCP - General (Internal Medicine) Rutherford Guys, MD as Consulting Physician (Ophthalmology) Kathrynn Ducking, MD (Inactive) as Consulting Physician (Neurology) Gayland Curry, DO (Geriatric Medicine)  Extended Emergency Contact Information Primary Emergency Contact: Grima,[name] Address: [address]          [place] [address] Johnnette Litter of [place] Phone: [phone] Mobile Phone: [phone] Relation: Spouse Secondary Emergency Contact: Mcclenton,Rishav Mobile Phone: [phone] Relation: Son  Code Status:  DNR Goals of care: Advanced Directive information    01/15/2022   10:47 AM  Advanced Directives  Does Patient Have a Medical Advance Directive? Yes  Type of Advance Directive Out of facility DNR (pink MOST or yellow form)  Does patient want to make changes to medical advance directive? No - Patient declined  Pre-existing out of facility DNR order (yellow form or pink MOST form) Pink MOST form placed in chart (order not valid for inpatient use);Yellow form placed in chart (order not valid for inpatient use)     Chief Complaint  Patient presents with   Acute Visit    ED follow up from head injury    HPI:  Pt is a 86 y.o. male seen today for f/u s/p ED visit 01/14/2022.   He currently resides on the assisted living unit at [facility] PMH: intractable seizures, status epilepticus, atrial fibrillation with coumadin use, parkinsonian syndrome with hypotension- s/p vagal nerve stimulator, Grover's disease, osteopenia, prostate cancer, glaucoma, abnormal gait.   07/31 he felt lightheaded while using bathroom. He fell backwards and hit his head. After incident he was sluggish and confused. He was taken to [facility] ED for evaluation. Troponin 5, Keppra level 33.9, INR  2.7, WBC 6.6, hgb 12.1, Na 129, glucose 116. Phenytoin level pending.  CT head with no evidence of intracranial abnormality, mild right occipital scalp soft tissue swelling noted. CT chest negative for consolidation, pleural effusion, vascular congestion, mild cardiomegaly and severe atheromatous calcifications of coronary arteries noted, 2.2 x 1.6 cm lesion to right thyroid lobe also noted. EKG sinus rhythm with LBBB. Etiology of fall unknown. Traumatic bleeding injuries associated with coumadin use, h/o seizures and falls risk discussed. Advised to follow up with PCP or cardiology regarding coumadin use.   Today, he denies chest pain, sob, headaches, N/V, blurred vision, and dizziness. He is very adamant he did not have a seizure yesterday. We discussed concerns for continuing coumadin use including falls with bleeding injury. He has not seen a cardiologist in years. He would like their opinion regarding current atrial fibrillation status and risk for clot going off coumadin.     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[place])    Urinary urgency    Past Surgical History:  Procedure Laterality Date  CARDIAC DEFIBRILLATOR PLACEMENT     cataract surgery     COLONOSCOPY     CRANIOTOMY Right    right anterior temporal resection   defibrillator replaced  March 2016   IMPLANTATION VAGAL NERVE STIMULATOR  June 2016   NASAL SINUS SURGERY     TONSILLECTOMY     age 54   VASECTOMY  22    Allergies  Allergen Reactions   Depakote [Divalproex Sodium] Other (See  Comments)    Arthralgias     Allergies as of 01/15/2022       Reactions   Depakote [divalproex Sodium] Other (See Comments)   Arthralgias        Medication List        Accurate as of January 15, 2022 11:20 AM. If you have any questions, ask your nurse or doctor.          ALPRAZolam 0.25 MG tablet Commonly known as: XANAX Take 1 tablet (0.25 mg total) by mouth 2 (two) times daily.   brimonidine-timolol 0.2-0.5 % ophthalmic solution Commonly known as: COMBIGAN Place 1 drop into both eyes every 12 (twelve) hours.   cholecalciferol 10 MCG (400 UNIT) Tabs tablet Commonly known as: VITAMIN D3 Take 400 Units by mouth 2 (two) times daily.   clobetasol cream 0.05 % Commonly known as: TEMOVATE Apply 1 application topically 2 (two) times daily as needed (For Flares).   clotrimazole 1 % cream Commonly known as: LOTRIMIN Apply 1 Application topically 2 (two) times daily.   econazole nitrate 1 % cream Apply topically as needed. Twice a day   finasteride 5 MG tablet Commonly known as: PROSCAR Take 5 mg by mouth every morning.   flecainide 50 MG tablet Commonly known as: TAMBOCOR Take 50 mg by mouth 2 (two) times daily.   fluocinonide ointment 0.05 % Commonly known as: LIDEX Apply 1 Application topically 2 (two) times daily.   ketoconazole 2 % shampoo Commonly known as: NIZORAL Apply 1 Application topically 2 (two) times a week.   lacosamide 200 MG Tabs tablet Commonly known as: Vimpat Take 1 tablet (200 mg total) by mouth 2 (two) times daily.   latanoprost 0.005 % ophthalmic solution Commonly known as: XALATAN Place 1 drop into both eyes at bedtime.   levETIRAcetam 750 MG 24 hr tablet Commonly known as: KEPPRA XR Take 2,250 mg by mouth daily. Take three tabs daily at 6pm   LORazepam 2 MG/ML concentrated solution Commonly known as: ATIVAN Give 2 mg IM for seizure.If continued seizure activity after 15 minutes, give 2 mg again as needed.   LORazepam 2 MG/ML  concentrated solution Commonly known as: LORazepam Intensol Take 0.5 mLs (1 mg total) by mouth daily as needed (postictal anxiety).   miconazole 2 % powder Commonly known as: MICOTIN Apply topically 2 (two) times daily.   phenytoin 100 MG ER capsule Commonly known as: DILANTIN Take 300 mg by mouth at bedtime. Special instruction: Dilantin 100 mg take 3 capsules (300) mg in the evening. Once an evening 06:30   phenytoin 50 MG tablet Commonly known as: DILANTIN Chew 75 mg by mouth daily. Chew 1.5 tablets (75 mg) by mouth daily one a morning (6:30) am   polyethylene glycol 17 g packet Commonly known as: MIRALAX / GLYCOLAX Take 17 g by mouth See admin instructions. Once daily every other day and as needed for constipation   polyethylene glycol powder 17 GM/SCOOP powder Commonly known as: GLYCOLAX/MIRALAX Take by mouth.   sodium chloride 1 g tablet Take  1 tablet (1 g total) by mouth 2 (two) times daily with a meal.   tamsulosin 0.4 MG Caps capsule Commonly known as: FLOMAX Take 0.4 mg by mouth.   triamcinolone cream 0.1 % Commonly known as: KENALOG Apply 1 Application topically 2 (two) times daily.   Tums Smoothies 750 MG chewable tablet Generic drug: calcium carbonate Chew 2 tablets by mouth 4 (four) times daily as needed for heartburn.   warfarin 2.5 MG tablet Commonly known as: COUMADIN Take 1 tablet (2.5 mg total) by mouth daily at 12 noon. What changed: additional instructions        Review of Systems  Constitutional:  Negative for activity change, appetite change, chills, fatigue and fever.  HENT:  Negative for congestion and trouble swallowing.   Eyes:  Negative for visual disturbance.  Respiratory:  Negative for cough, shortness of breath and wheezing.   Cardiovascular:  Negative for chest pain and leg swelling.  Gastrointestinal:  Negative for abdominal distention, abdominal pain, constipation, diarrhea, nausea and vomiting.  Genitourinary:  Negative for  dysuria, frequency and hematuria.  Musculoskeletal:  Positive for gait problem.  Skin:  Positive for wound.  Neurological:  Positive for seizures, syncope and weakness. Negative for dizziness, light-headedness and headaches.  Psychiatric/Behavioral:  Positive for confusion. Negative for dysphoric mood and sleep disturbance. The patient is not nervous/anxious.     Immunization History  Administered Date(s) Administered   Influenza, High Dose Seasonal PF 04/07/2020   Influenza, Seasonal, Injecte, Preservative Fre 02/22/2010   Influenza,inj,Quad PF,6+ Mos 04/07/2018, 04/15/2019   Influenza-Unspecified 03/17/2013, 02/24/2014, 02/16/2015, 03/21/2016, 03/17/2017, 04/07/2018, 03/21/2021   Moderna SARS-COV2 Booster Vaccination 03/28/2021   Moderna Sars-Covid-2 Vaccination 06/28/2019, 07/27/2019, 05/01/2020   Pneumococcal Conjugate-13 07/05/2013   Pneumococcal Polysaccharide-23 06/17/2006   Td 06/18/2007   Tdap 12/07/2015, 09/17/2020   Zoster Recombinat (Shingrix) 12/04/2016   Zoster, Live 10/06/2008   Pertinent  Health Maintenance Due  Topic Date Due   INFLUENZA VACCINE  01/15/2022      07/05/2020   10:14 AM 09/17/2020    8:39 AM 01/17/2021    8:57 AM 04/23/2021    1:07 PM 01/14/2022    9:50 AM  Fall Risk  Falls in the past year? '1  1 1   '$ Was there an injury with Fall? '1  1 1   '$ Fall Risk Category Calculator '3  3 3   '$ Fall Risk Category High  High High   Patient Fall Risk Level Low fall risk High fall risk High fall risk High fall risk Low fall risk  Patient at Risk for Falls Due to   Impaired mobility    Fall risk Follow up   Falls evaluation completed Falls evaluation completed    Functional Status Survey:    Vitals:   01/15/22 1042  BP: 117/68  Pulse: 61  Resp: 18  Temp: (!) 97.3 F (36.3 C)  SpO2: 94%  Weight: 170 lb (77.1 kg)  Height: '5\' 5"'$  (1.651 m)   Body mass index is 28.29 kg/m. Physical Exam Vitals reviewed.  Constitutional:      General: He is not in acute  distress. HENT:     Head: Normocephalic.     Comments: Approx 1-2 cm abrasion to right occipital , CDI, no sign of infection, surrounding skin intact    Right Ear: There is no impacted cerumen.     Left Ear: There is no impacted cerumen.     Nose: Nose normal.     Mouth/Throat:     Mouth:  Mucous membranes are moist.  Eyes:     General:        Right eye: No discharge.        Left eye: No discharge.     Extraocular Movements:     Right eye: Normal extraocular motion and no nystagmus.     Left eye: Normal extraocular motion and no nystagmus.     Pupils: Pupils are equal, round, and reactive to light.  Cardiovascular:     Rate and Rhythm: Normal rate. Rhythm irregular.     Pulses: Normal pulses.     Heart sounds: Normal heart sounds.  Pulmonary:     Effort: Pulmonary effort is normal. No respiratory distress.     Breath sounds: Normal breath sounds. No wheezing.  Abdominal:     General: Bowel sounds are normal. There is no distension.     Palpations: Abdomen is soft.     Tenderness: There is no abdominal tenderness.  Musculoskeletal:     Cervical back: Neck supple.     Right lower leg: Edema present.     Left lower leg: Edema present.     Comments: Non pitting  Skin:    General: Skin is warm and dry.     Capillary Refill: Capillary refill takes less than 2 seconds.     Comments: Small skin tear to right thigh, CDI, surrounding tissue intact  Neurological:     General: No focal deficit present.     Mental Status: He is alert and oriented to person, place, and time.     Motor: Weakness present.     Gait: Gait abnormal.  Psychiatric:        Mood and Affect: Mood normal.        Behavior: Behavior normal.     Comments: Very pleasant, follows commands, alert to self/person/situation     Labs reviewed: Recent Labs    09/25/21 0000 11/16/21 1627 01/14/22 0953  NA 140 134* 129*  K 4.7 4.6 4.4  CL 101 101 99  CO2 25* 26* 22  GLUCOSE  --   --  108*  BUN '12 14 10   '$ CREATININE 0.7 0.9 0.81  CALCIUM 9.1 8.4* 8.6*   No results for input(s): "AST", "ALT", "ALKPHOS", "BILITOT", "PROT", "ALBUMIN" in the last 8760 hours. Recent Labs    05/15/21 0000 01/14/22 0953  WBC 6.2 6.6  HGB 12.6* 12.1*  HCT 39* 34.4*  MCV  --  93.0  PLT 256 202   No results found for: "TSH" No results found for: "HGBA1C" Lab Results  Component Value Date   CHOL 214 (A) 05/15/2021   HDL 61 05/15/2021   LDLCALC 141 05/15/2021   TRIG 79 05/15/2021    Significant Diagnostic Results in last 30 days:  CT Chest Wo Contrast  Result Date: 01/14/2022 CLINICAL DATA:  Pneumonia, complication suspected, xray done EXAM: CT CHEST WITHOUT CONTRAST TECHNIQUE: Multidetector CT imaging of the chest was performed following the standard protocol without IV contrast. RADIATION DOSE REDUCTION: This exam was performed according to the departmental dose-optimization program which includes automated exposure control, adjustment of the mA and/or kV according to patient size and/or use of iterative reconstruction technique. COMPARISON:  Correlation is made with a chest x-ray done on the same day earlier FINDINGS: Cardiovascular: Mild atheromatous calcifications of the thoracic aorta. No aneurysm. Severe atheromatous no significant vascular findings. Normal heart size. No pericardial effusion. Mediastinum/Nodes: Hypodense lesion in the right thyroid lobe measuring 2.2 x 1.6 cm. Left thyroid lobe has  a normal appearance. No mediastinal mass or significant lymphadenopathy. No significant axillary lymphadenopathy. Small hiatal hernia. Lungs/Pleura: No focal consolidation, pleural effusion or vascular congestion. There are some interstitial changes seen with a subpleural reticulations at the bibasilar regions. There is band of chronic atelectasis/pulmonary scarring seen at the left lung base. Upper Abdomen: No acute abnormality. Musculoskeletal: Again seen is the left subclavian pacer defibrillator. There is a  nerve stimulator at the right side of the chest. Mild-to-moderate spondylosis of the cervical and thoracic spine. IMPRESSION: 1. No focal consolidation, pleural effusion or vascular congestion. Interstitial changes at the lung bases. There is a band of atelectasis/scarring at the left lung base. 2. Mild cardiomegaly. Severe atheromatous calcifications of the coronary arteries. 3.  2.2 x 1.6 cm hypodense lesion in the right thyroid lobe. Pathology: 2.2 cm incidental right thyroid nodule. Recommend thyroid US.Reference: J Am Coll Radiol. 2015 Feb;12(2): 143-50 Electronically Signed   By: Frazier Richards M.D.   On: 01/14/2022 10:47   CT HEAD WO CONTRAST (5MM)  Result Date: 01/14/2022 CLINICAL DATA:  Head trauma.  Fall on Coumadin. EXAM: CT HEAD WITHOUT CONTRAST TECHNIQUE: Contiguous axial images were obtained from the base of the skull through the vertex without intravenous contrast. RADIATION DOSE REDUCTION: This exam was performed according to the departmental dose-optimization program which includes automated exposure control, adjustment of the mA and/or kV according to patient size and/or use of iterative reconstruction technique. COMPARISON:  Head CT 01/29/2019 FINDINGS: Brain: There is no evidence of an acute infarct, intracranial hemorrhage, mass, midline shift, or extra-axial fluid collection. Sequelae of anterior right temporal lobectomy are again identified. There is an unchanged small chronic infarct involving the right cingulate gyrus. Hypodensities elsewhere in the cerebral white matter bilaterally are unchanged and nonspecific but compatible with mild chronic small vessel ischemic disease. There is mild global cerebral atrophy. Vascular: Calcified atherosclerosis at the skull base. No hyperdense vessel. Skull: Right pterional craniotomy.  No acute fracture. Sinuses/Orbits: Mucosal thickening throughout the paranasal sinuses including subtotal opacification of the right maxillary sinus by circumferential  mucosal thickening. Bubbly secretions in the left maxillary sinus. Small, chronic left mastoid effusion. Bilateral cataract extraction. Other: Mild right occipital scalp soft tissue swelling. IMPRESSION: 1. No evidence of acute intracranial abnormality. 2. Mild right occipital scalp soft tissue swelling. 3. Chronic brain findings as above. Electronically Signed   By: Logan Bores M.D.   On: 01/14/2022 10:36   DG Chest Portable 1 View  Result Date: 01/14/2022 CLINICAL DATA:  86 year old male presenting for evaluation of fall, level 2 trauma with history of blood thinners EXAM: PORTABLE CHEST 1 VIEW COMPARISON:  September 03, 2018. FINDINGS: Nerve stimulator and cardiac pacer defibrillator in place over the chest. Leads extending cephalad from nerve stimulator. Dual lead pacer defibrillator with similar appearance to prior imaging. EKG leads project over the chest. Trachea midline. Cardiomediastinal contours and hilar structures are stable accounting for portable technique and AP projection. Subtle interstitial opacities and patchy opacities in the chest greatest in the LEFT mid and lower chest. No visible pneumothorax. No lobar consolidation. No gross pleural effusion on frontal radiograph. On limited assessment no acute skeletal findings. IMPRESSION: Subtle interstitial opacities and patchy opacities in the chest greatest in the LEFT mid and lower chest. Findings may represent asymmetric edema or atypical infection. Electronically Signed   By: Zetta Bills M.D.   On: 01/14/2022 09:45    Assessment/Plan 1. Abrasion of head, subsequent encounter - CT head negative for acute abnormality - small abrasion  to right occipital- no sign of infection  2. Chronic atrial fibrillation - ongoing - not evaluated by cardiology in years  - diagnosed in [place] - remains on coumadin - INR 2.7 01/14/2022 - cardiology referral to discuss afib/ risk coming off coumadin  3. Refractory epilepsy ([place]) - followed by  neurology - denies seizure yesterday- sluggish/confused after incident - Dilantin level- lending - Keppra level normal - discussed risks of coumadin use and seizures  - see above - cont Keppra, Dilantin and Vimpat  4. Parkinsonian syndrome associated with symptomatic orthostatic hypotension (HCC) - followed by neurology - unstable gait - high falls risk - PT evaluation  5. Frequent falls - mechanical fall vs seizure vs orthostatic syncope 01/14/2022 - see above  6. Thyroid nodule - 2.2 x 1.6 cm lesion to right thyroid lobe noted on CT chest - thyroid U/S    Family/ staff Communication: plan discussed with patient, wife, and nurse  Labs/tests ordered:  cardiology consult, thyroid U/S, PT evaluation

## 2022-01-18 ENCOUNTER — Encounter: Payer: Self-pay | Admitting: Adult Health

## 2022-01-18 ENCOUNTER — Non-Acute Institutional Stay: Payer: Medicare Other | Admitting: Adult Health

## 2022-01-18 DIAGNOSIS — L111 Transient acantholytic dermatosis [Grover]: Secondary | ICD-10-CM | POA: Diagnosis not present

## 2022-01-18 DIAGNOSIS — E871 Hypo-osmolality and hyponatremia: Secondary | ICD-10-CM

## 2022-01-18 DIAGNOSIS — G40911 Epilepsy, unspecified, intractable, with status epilepticus: Secondary | ICD-10-CM | POA: Diagnosis not present

## 2022-01-18 DIAGNOSIS — L03115 Cellulitis of right lower limb: Secondary | ICD-10-CM | POA: Diagnosis not present

## 2022-01-18 MED ORDER — CEPHALEXIN 500 MG PO CAPS: 500.0000 mg | ORAL_CAPSULE | Freq: Three times a day (TID) | ORAL | 0 refills | Status: AC

## 2022-01-18 NOTE — Progress Notes (Addendum)
Location:  Occupational psychologist of Service:  ALF (13) Provider:   Cindi Carbon, [place] [phone]   [name], MD  Patient Care Team: [name], MD as PCP - General (Internal Medicine) Rutherford Guys, MD as Consulting Physician (Ophthalmology) Kathrynn Ducking, MD (Inactive) as Consulting Physician (Neurology) Gayland Curry, DO (Geriatric Medicine)  Extended Emergency Contact Information Primary Emergency Contact: Dubree,[name] Address: [address]          [place] [address] Johnnette Litter of [place] Phone: [phone] Mobile Phone: [phone] Relation: Spouse Secondary Emergency Contact: Dundon,Toryn Mobile Phone: [phone] Relation: Son  Code Status:  DNR Goals of care: Advanced Directive information    01/15/2022   10:47 AM  Advanced Directives  Does Patient Have a Medical Advance Directive? Yes  Type of Advance Directive Out of facility DNR (pink MOST or yellow form)  Does patient want to make changes to medical advance directive? No - Patient declined  Pre-existing out of facility DNR order (yellow form or pink MOST form) Pink MOST form placed in chart (order not valid for inpatient use);Yellow form placed in chart (order not valid for inpatient use)     Chief Complaint  Patient presents with   Acute Visit    Right lower ext redness      HPI:  Pt is a 86 y.o. male seen today for an acute visit for right lower ext redness. Has a hx of Grover's disease. Using econazole to for four weeks. Also had cellulitis to RLE in June and took Keflex for 7 days. Now redness and swelling worse to RLE. He has a hx of afib and is on coumadin INR therapeutic 7/31.  Was seen in the ER due to fall with head injury 7/31. NA 129.  CT of the head did not show acute bleed. He is going to see a cardiology to evaluate whether risk/benefit of continuing coumadin due to his fall risk.   Has chronic low sodium but has  trended down. Feels weaker today. No fever. No nausea or vomiting. Eating and drinking well. NA 129 7/31  Also has a thyroid nodule found on CT of the chest 7/31 being evaluated by [place] ( pending) Past Medical History:  Diagnosis Date   Abnormality of gait 12/21/2013   Atrial fibrillation (HCC)    Blind left eye    Carcinoma in situ of prostate    Cardiac arrest ([place])    Glaucoma    Hypercholesteremia    Hyperlipidemia    Metabolic encephalopathy    Mild left ventricular systolic dysfunction    Presence of other specified functional implants    PSA elevation    Right frontal lobe lesion    Seizures (HCC)    Tinea pedis    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grover)    Urinary urgency    Past Surgical History:  Procedure Laterality Date   CARDIAC DEFIBRILLATOR PLACEMENT     cataract surgery     COLONOSCOPY     CRANIOTOMY Right    right anterior temporal resection   defibrillator replaced  March 2016   IMPLANTATION VAGAL NERVE STIMULATOR  June 2016   NASAL SINUS SURGERY     TONSILLECTOMY     age 67   VASECTOMY  50    Allergies  Allergen Reactions   Depakote [Divalproex Sodium] Other (See Comments)    Arthralgias     Outpatient Encounter Medications as of 01/18/2022  Medication Sig   cephALEXin (KEFLEX) 500 MG capsule Take 1 capsule (500 mg total) by mouth 3 (three) times daily for 10 days.   ALPRAZolam (XANAX) 0.25 MG tablet Take 1 tablet (0.25 mg total) by mouth 2 (two) times daily.   brimonidine-timolol (COMBIGAN) 0.2-0.5 % ophthalmic solution Place 1 drop into both eyes every 12 (twelve) hours.    calcium carbonate (TUMS SMOOTHIES) 750 MG chewable tablet Chew 2 tablets by mouth 4 (four) times daily as needed for heartburn.   cholecalciferol (VITAMIN D) 400 units TABS tablet Take 400 Units by mouth 2 (two) times daily.   clobetasol cream (TEMOVATE) 2.45 % Apply 1 application topically 2 (two) times daily as needed (For Flares).   clotrimazole (LOTRIMIN) 1 % cream Apply  1 Application topically 2 (two) times daily.   econazole nitrate 1 % cream Apply topically as needed. Twice a day   finasteride (PROSCAR) 5 MG tablet Take 5 mg by mouth every morning.    flecainide (TAMBOCOR) 50 MG tablet Take 50 mg by mouth 2 (two) times daily.   fluocinonide ointment (LIDEX) 8.09 % Apply 1 Application topically 2 (two) times daily.   ketoconazole (NIZORAL) 2 % shampoo Apply 1 Application topically 2 (two) times a week.   lacosamide (VIMPAT) 200 MG TABS tablet Take 1 tablet (200 mg total) by mouth 2 (two) times daily.   latanoprost (XALATAN) 0.005 % ophthalmic solution Place 1 drop into both eyes at bedtime.   Levetiracetam 750 MG TB24 Take 2,250 mg by mouth daily. Take three tabs daily at 6pm   LORazepam (ATIVAN) 2 MG/ML concentrated solution Give 2 mg IM for seizure.If continued seizure activity after 15 minutes, give 2 mg again as needed.   LORazepam (LORAZEPAM INTENSOL) 2 MG/ML concentrated solution Take 0.5 mLs (1 mg total) by mouth daily as needed (postictal anxiety).   miconazole (MICOTIN) 2 % powder Apply topically 2 (two) times daily.   phenytoin (DILANTIN) 100 MG ER capsule Take 300 mg by mouth at bedtime. Special instruction: Dilantin 100 mg take 3 capsules (300) mg in the evening. Once an evening 06:30   phenytoin (DILANTIN) 50 MG tablet Chew 75 mg by mouth daily. Chew 1.5 tablets (75 mg) by mouth daily one a morning (6:30) am   polyethylene glycol (MIRALAX / GLYCOLAX) packet Take 17 g by mouth See admin instructions. Once daily every other day and as needed for constipation   polyethylene glycol powder (GLYCOLAX/MIRALAX) 17 GM/SCOOP powder Take by mouth.   sodium chloride 1 g tablet Take 1 tablet (1 g total) by mouth 2 (two) times daily with a meal.   tamsulosin (FLOMAX) 0.4 MG CAPS capsule Take 0.4 mg by mouth.   triamcinolone cream (KENALOG) 0.1 % Apply 1 Application topically 2 (two) times daily.   warfarin (COUMADIN) 2.5 MG tablet Take 1 tablet (2.5 mg total) by  mouth daily at 12 noon. (Patient taking differently: Take 2.5 mg by mouth daily at 12 noon. 2.5 mg qd except 2 mg on wednesday)   No facility-administered encounter medications on file as of 01/18/2022.    Review of Systems  Constitutional:  Positive for fatigue. Negative for activity change, appetite change, chills, diaphoresis, fever and unexpected weight change.  Respiratory:  Negative for cough, shortness of breath, wheezing and stridor.   Cardiovascular:  Positive for leg swelling. Negative for chest pain and palpitations.  Gastrointestinal:  Negative for abdominal distention, abdominal pain, constipation and diarrhea.  Genitourinary:  Negative for difficulty urinating and dysuria.  Musculoskeletal:  Positive for gait problem. Negative for arthralgias, back pain, joint swelling and myalgias.  Skin:  Positive for color change and rash.  Neurological:  Positive for weakness. Negative for dizziness, seizures, syncope, facial asymmetry, speech difficulty and headaches.  Hematological:  Negative for adenopathy. Does not bruise/bleed easily.  Psychiatric/Behavioral:  Negative for agitation, behavioral problems and confusion.     Immunization History  Administered Date(s) Administered   Influenza, High Dose Seasonal PF 04/07/2020   Influenza, Seasonal, Injecte, Preservative Fre 02/22/2010   Influenza,inj,Quad PF,6+ Mos 04/07/2018, 04/15/2019   Influenza-Unspecified 03/17/2013, 02/24/2014, 02/16/2015, 03/21/2016, 03/17/2017, 04/07/2018, 03/21/2021   Moderna SARS-COV2 Booster Vaccination 03/28/2021   Moderna Sars-Covid-2 Vaccination 06/28/2019, 07/27/2019, 05/01/2020   Pneumococcal Conjugate-13 07/05/2013   Pneumococcal Polysaccharide-23 06/17/2006   Td 06/18/2007   Tdap 12/07/2015, 09/17/2020   Zoster Recombinat (Shingrix) 12/04/2016   Zoster, Live 10/06/2008   Pertinent  Health Maintenance Due  Topic Date Due   INFLUENZA VACCINE  01/15/2022      07/05/2020   10:14 AM 09/17/2020     8:39 AM 01/17/2021    8:57 AM 04/23/2021    1:07 PM 01/14/2022    9:50 AM  Fall Risk  Falls in the past year? '1  1 1   '$ Was there an injury with Fall? '1  1 1   '$ Fall Risk Category Calculator '3  3 3   '$ Fall Risk Category High  High High   Patient Fall Risk Level Low fall risk High fall risk High fall risk High fall risk Low fall risk  Patient at Risk for Falls Due to   Impaired mobility    Fall risk Follow up   Falls evaluation completed Falls evaluation completed    Functional Status Survey:    Vitals:   01/18/22 1531  BP: 136/75  Pulse: 60  Resp: 18  Temp: (!) 97.5 F (36.4 C)  SpO2: 99%   There is no height or weight on file to calculate BMI. Physical Exam Constitutional:      General: He is not in acute distress.    Appearance: He is not diaphoretic.  HENT:     Mouth/Throat:     Mouth: Mucous membranes are moist.     Pharynx: Oropharynx is clear.  Eyes:     Extraocular Movements: Extraocular movements intact.     Conjunctiva/sclera: Conjunctivae normal.     Pupils: Pupils are equal, round, and reactive to light.  Neck:     Thyroid: No thyromegaly.     Vascular: No JVD.     Trachea: No tracheal deviation.  Cardiovascular:     Rate and Rhythm: Normal rate. Rhythm irregular.     Heart sounds: No murmur heard. Pulmonary:     Effort: Pulmonary effort is normal. No respiratory distress.     Breath sounds: Normal breath sounds. No wheezing.  Abdominal:     General: Bowel sounds are normal. There is no distension.     Palpations: Abdomen is soft.     Tenderness: There is no abdominal tenderness.  Musculoskeletal:     Right lower leg: Edema (+2) present.     Left lower leg: Edema (+1) present.  Lymphadenopathy:     Cervical: No cervical adenopathy.  Skin:    General: Skin is warm and dry.     Comments: Maculopapular rash to BLE, R>L No purulent drainage RLE with warmth, erythema. Not tender or painful.   Neurological:     General: No focal deficit present.      Mental  Status: He is alert and oriented to person, place, and time. Mental status is at baseline.     Cranial Nerves: No cranial nerve deficit.     Labs reviewed: Recent Labs    09/25/21 0000 11/16/21 1627 01/14/22 0953  NA 140 134* 129*  K 4.7 4.6 4.4  CL 101 101 99  CO2 25* 26* 22  GLUCOSE  --   --  108*  BUN '12 14 10  '$ CREATININE 0.7 0.9 0.81  CALCIUM 9.1 8.4* 8.6*   No results for input(s): "AST", "ALT", "ALKPHOS", "BILITOT", "PROT", "ALBUMIN" in the last 8760 hours. Recent Labs    05/15/21 0000 01/14/22 0953  WBC 6.2 6.6  HGB 12.6* 12.1*  HCT 39* 34.4*  MCV  --  93.0  PLT 256 202   No results found for: "TSH" No results found for: "HGBA1C" Lab Results  Component Value Date   CHOL 214 (A) 05/15/2021   HDL 61 05/15/2021   LDLCALC 141 05/15/2021   TRIG 79 05/15/2021    Significant Diagnostic Results in last 30 days:  CT Chest Wo Contrast  Result Date: 01/14/2022 CLINICAL DATA:  Pneumonia, complication suspected, xray done EXAM: CT CHEST WITHOUT CONTRAST TECHNIQUE: Multidetector CT imaging of the chest was performed following the standard protocol without IV contrast. RADIATION DOSE REDUCTION: This exam was performed according to the departmental dose-optimization program which includes automated exposure control, adjustment of the mA and/or kV according to patient size and/or use of iterative reconstruction technique. COMPARISON:  Correlation is made with a chest x-ray done on the same day earlier FINDINGS: Cardiovascular: Mild atheromatous calcifications of the thoracic aorta. No aneurysm. Severe atheromatous no significant vascular findings. Normal heart size. No pericardial effusion. Mediastinum/Nodes: Hypodense lesion in the right thyroid lobe measuring 2.2 x 1.6 cm. Left thyroid lobe has a normal appearance. No mediastinal mass or significant lymphadenopathy. No significant axillary lymphadenopathy. Small hiatal hernia. Lungs/Pleura: No focal consolidation, pleural  effusion or vascular congestion. There are some interstitial changes seen with a subpleural reticulations at the bibasilar regions. There is band of chronic atelectasis/pulmonary scarring seen at the left lung base. Upper Abdomen: No acute abnormality. Musculoskeletal: Again seen is the left subclavian pacer defibrillator. There is a nerve stimulator at the right side of the chest. Mild-to-moderate spondylosis of the cervical and thoracic spine. IMPRESSION: 1. No focal consolidation, pleural effusion or vascular congestion. Interstitial changes at the lung bases. There is a band of atelectasis/scarring at the left lung base. 2. Mild cardiomegaly. Severe atheromatous calcifications of the coronary arteries. 3.  2.2 x 1.6 cm hypodense lesion in the right thyroid lobe. Pathology: 2.2 cm incidental right thyroid nodule. Recommend thyroid US.Reference: J Am Coll Radiol. 2015 Feb;12(2): 143-50 Electronically Signed   By: Frazier Richards M.D.   On: 01/14/2022 10:47   CT HEAD WO CONTRAST (5MM)  Result Date: 01/14/2022 CLINICAL DATA:  Head trauma.  Fall on Coumadin. EXAM: CT HEAD WITHOUT CONTRAST TECHNIQUE: Contiguous axial images were obtained from the base of the skull through the vertex without intravenous contrast. RADIATION DOSE REDUCTION: This exam was performed according to the departmental dose-optimization program which includes automated exposure control, adjustment of the mA and/or kV according to patient size and/or use of iterative reconstruction technique. COMPARISON:  Head CT 01/29/2019 FINDINGS: Brain: There is no evidence of an acute infarct, intracranial hemorrhage, mass, midline shift, or extra-axial fluid collection. Sequelae of anterior right temporal lobectomy are again identified. There is an unchanged small chronic infarct involving the right cingulate  gyrus. Hypodensities elsewhere in the cerebral white matter bilaterally are unchanged and nonspecific but compatible with mild chronic small vessel  ischemic disease. There is mild global cerebral atrophy. Vascular: Calcified atherosclerosis at the skull base. No hyperdense vessel. Skull: Right pterional craniotomy.  No acute fracture. Sinuses/Orbits: Mucosal thickening throughout the paranasal sinuses including subtotal opacification of the right maxillary sinus by circumferential mucosal thickening. Bubbly secretions in the left maxillary sinus. Small, chronic left mastoid effusion. Bilateral cataract extraction. Other: Mild right occipital scalp soft tissue swelling. IMPRESSION: 1. No evidence of acute intracranial abnormality. 2. Mild right occipital scalp soft tissue swelling. 3. Chronic brain findings as above. Electronically Signed   By: Logan Bores M.D.   On: 01/14/2022 10:36   DG Chest Portable 1 View  Result Date: 01/14/2022 CLINICAL DATA:  86 year old male presenting for evaluation of fall, level 2 trauma with history of blood thinners EXAM: PORTABLE CHEST 1 VIEW COMPARISON:  September 03, 2018. FINDINGS: Nerve stimulator and cardiac pacer defibrillator in place over the chest. Leads extending cephalad from nerve stimulator. Dual lead pacer defibrillator with similar appearance to prior imaging. EKG leads project over the chest. Trachea midline. Cardiomediastinal contours and hilar structures are stable accounting for portable technique and AP projection. Subtle interstitial opacities and patchy opacities in the chest greatest in the LEFT mid and lower chest. No visible pneumothorax. No lobar consolidation. No gross pleural effusion on frontal radiograph. On limited assessment no acute skeletal findings. IMPRESSION: Subtle interstitial opacities and patchy opacities in the chest greatest in the LEFT mid and lower chest. Findings may represent asymmetric edema or atypical infection. Electronically Signed   By: Zetta Bills M.D.   On: 01/14/2022 09:45    Assessment/Plan 1. Cellulitis of right lower extremity No fever or chills. Eating and  drinking well  - cephALEXin (KEFLEX) 500 MG capsule; Take 1 capsule (500 mg total) by mouth 3 (three) times daily for 10 days.  Dispense: 30 capsule; Refill: 0  2. Grover's disease Ensure patient is taking econazole BID, rash is worse to lower ext Discussed with nurse.   3. Hyponatremia Na 129 01/14/22, 134 11/16/21 Slightly weaker today  Chronic issue on sodium tabs and fluid restriction Will repeat BMP     Family/ staff Communication: resident   Labs/tests ordered:  BMP CBC

## 2022-01-19 ENCOUNTER — Other Ambulatory Visit: Payer: Self-pay

## 2022-01-19 ENCOUNTER — Encounter (HOSPITAL_COMMUNITY): Payer: Self-pay

## 2022-01-19 ENCOUNTER — Telehealth: Payer: Self-pay | Admitting: Nurse Practitioner

## 2022-01-19 ENCOUNTER — Inpatient Hospital Stay (HOSPITAL_COMMUNITY)
Admission: EM | Admit: 2022-01-19 | Discharge: 2022-01-27 | DRG: 640 | Disposition: A | Discharge status: Skilled Nursing Facility | Payer: Medicare Other | Source: Skilled Nursing Facility | Attending: Internal Medicine | Admitting: Internal Medicine

## 2022-01-19 ENCOUNTER — Observation Stay (HOSPITAL_COMMUNITY): Payer: Medicare Other

## 2022-01-19 DIAGNOSIS — E041 Nontoxic single thyroid nodule: Secondary | ICD-10-CM | POA: Diagnosis not present

## 2022-01-19 DIAGNOSIS — Z9689 Presence of other specified functional implants: Secondary | ICD-10-CM | POA: Diagnosis present

## 2022-01-19 DIAGNOSIS — Z95810 Presence of automatic (implantable) cardiac defibrillator: Secondary | ICD-10-CM | POA: Diagnosis not present

## 2022-01-19 DIAGNOSIS — Z79899 Other long term (current) drug therapy: Secondary | ICD-10-CM

## 2022-01-19 DIAGNOSIS — Z888 Allergy status to other drugs, medicaments and biological substances status: Secondary | ICD-10-CM | POA: Diagnosis not present

## 2022-01-19 DIAGNOSIS — Z66 Do not resuscitate: Secondary | ICD-10-CM | POA: Diagnosis present

## 2022-01-19 DIAGNOSIS — K5900 Constipation, unspecified: Secondary | ICD-10-CM | POA: Diagnosis present

## 2022-01-19 DIAGNOSIS — I951 Orthostatic hypotension: Secondary | ICD-10-CM | POA: Diagnosis present

## 2022-01-19 DIAGNOSIS — G40909 Epilepsy, unspecified, not intractable, without status epilepticus: Secondary | ICD-10-CM | POA: Diagnosis not present

## 2022-01-19 DIAGNOSIS — H409 Unspecified glaucoma: Secondary | ICD-10-CM | POA: Diagnosis present

## 2022-01-19 DIAGNOSIS — L03115 Cellulitis of right lower limb: Secondary | ICD-10-CM | POA: Diagnosis present

## 2022-01-19 DIAGNOSIS — I10 Essential (primary) hypertension: Secondary | ICD-10-CM | POA: Diagnosis not present

## 2022-01-19 DIAGNOSIS — R569 Unspecified convulsions: Secondary | ICD-10-CM | POA: Diagnosis not present

## 2022-01-19 DIAGNOSIS — E871 Hypo-osmolality and hyponatremia: Secondary | ICD-10-CM | POA: Diagnosis not present

## 2022-01-19 DIAGNOSIS — G9341 Metabolic encephalopathy: Secondary | ICD-10-CM | POA: Diagnosis present

## 2022-01-19 DIAGNOSIS — E7800 Pure hypercholesterolemia, unspecified: Secondary | ICD-10-CM | POA: Diagnosis present

## 2022-01-19 DIAGNOSIS — R350 Frequency of micturition: Secondary | ICD-10-CM | POA: Diagnosis present

## 2022-01-19 DIAGNOSIS — G20C Parkinsonism, unspecified: Secondary | ICD-10-CM | POA: Diagnosis present

## 2022-01-19 DIAGNOSIS — Z8674 Personal history of sudden cardiac arrest: Secondary | ICD-10-CM | POA: Diagnosis not present

## 2022-01-19 DIAGNOSIS — R9431 Abnormal electrocardiogram [ECG] [EKG]: Secondary | ICD-10-CM | POA: Diagnosis not present

## 2022-01-19 DIAGNOSIS — Z86002 Personal history of in-situ neoplasm of other and unspecified genital organs: Secondary | ICD-10-CM

## 2022-01-19 DIAGNOSIS — Z7901 Long term (current) use of anticoagulants: Secondary | ICD-10-CM

## 2022-01-19 DIAGNOSIS — W19XXXA Unspecified fall, initial encounter: Secondary | ICD-10-CM | POA: Diagnosis not present

## 2022-01-19 DIAGNOSIS — H5462 Unqualified visual loss, left eye, normal vision right eye: Secondary | ICD-10-CM | POA: Diagnosis present

## 2022-01-19 DIAGNOSIS — R4182 Altered mental status, unspecified: Secondary | ICD-10-CM | POA: Diagnosis not present

## 2022-01-19 DIAGNOSIS — L03116 Cellulitis of left lower limb: Secondary | ICD-10-CM | POA: Diagnosis present

## 2022-01-19 DIAGNOSIS — I482 Chronic atrial fibrillation, unspecified: Secondary | ICD-10-CM | POA: Diagnosis present

## 2022-01-19 DIAGNOSIS — N401 Enlarged prostate with lower urinary tract symptoms: Secondary | ICD-10-CM | POA: Diagnosis present

## 2022-01-19 DIAGNOSIS — R451 Restlessness and agitation: Secondary | ICD-10-CM

## 2022-01-19 DIAGNOSIS — G20 Parkinson's disease: Secondary | ICD-10-CM | POA: Diagnosis not present

## 2022-01-19 DIAGNOSIS — R7889 Finding of other specified substances, not normally found in blood: Secondary | ICD-10-CM | POA: Diagnosis not present

## 2022-01-19 LAB — BASIC METABOLIC PANEL
Anion gap: 6 (ref 5–15)
Anion gap: 11 (ref 5–15)
Anion gap: 7 (ref 5–15)
BUN: 9 mg/dL (ref 8–23)
BUN: 7 mg/dL — ABNORMAL LOW (ref 8–23)
BUN: 8 mg/dL (ref 8–23)
CO2: 22 mmol/L (ref 22–32)
CO2: 21 mmol/L — ABNORMAL LOW (ref 22–32)
CO2: 20 mmol/L — ABNORMAL LOW (ref 22–32)
Calcium: 8.4 mg/dL — ABNORMAL LOW (ref 8.9–10.3)
Calcium: 8.5 mg/dL — ABNORMAL LOW (ref 8.9–10.3)
Calcium: 8.1 mg/dL — ABNORMAL LOW (ref 8.9–10.3)
Chloride: 95 mmol/L — ABNORMAL LOW (ref 98–111)
Chloride: 91 mmol/L — ABNORMAL LOW (ref 98–111)
Chloride: 96 mmol/L — ABNORMAL LOW (ref 98–111)
Creatinine, Ser: 0.65 mg/dL (ref 0.61–1.24)
Creatinine, Ser: 0.62 mg/dL (ref 0.61–1.24)
Creatinine, Ser: 0.69 mg/dL (ref 0.61–1.24)
GFR, Estimated: >60 mL/min (ref >60)
GFR, Estimated: >60 mL/min (ref >60)
GFR, Estimated: >60 mL/min (ref >60)
Glucose, Bld: 104 mg/dL — ABNORMAL HIGH (ref 70–99)
Glucose, Bld: 131 mg/dL — ABNORMAL HIGH (ref 70–99)
Glucose, Bld: 100 mg/dL — ABNORMAL HIGH (ref 70–99)
Potassium: 4.4 mmol/L (ref 3.5–5.1)
Potassium: 3.8 mmol/L (ref 3.5–5.1)
Potassium: 4.5 mmol/L (ref 3.5–5.1)
Sodium: 123 mmol/L — ABNORMAL LOW (ref 135–145)
Sodium: 123 mmol/L — ABNORMAL LOW (ref 135–145)
Sodium: 123 mmol/L — ABNORMAL LOW (ref 135–145)

## 2022-01-19 LAB — I-STAT CHEM 8, ED
BUN: 10 mg/dL (ref 8–23)
Calcium, Ion: 1.11 mmol/L — ABNORMAL LOW (ref 1.15–1.40)
Chloride: 92 mmol/L — ABNORMAL LOW (ref 98–111)
Creatinine, Ser: 0.5 mg/dL — ABNORMAL LOW (ref 0.61–1.24)
Glucose, Bld: 101 mg/dL — ABNORMAL HIGH (ref 70–99)
HCT: 33 % — ABNORMAL LOW (ref 39.0–52.0)
Hemoglobin: 11.2 g/dL — ABNORMAL LOW (ref 13.0–17.0)
Potassium: 4.7 mmol/L (ref 3.5–5.1)
Sodium: 123 mmol/L — ABNORMAL LOW (ref 135–145)
TCO2: 24 mmol/L (ref 22–32)

## 2022-01-19 LAB — CBC WITH DIFFERENTIAL/PLATELET
Abs Immature Granulocytes: 0.02 10*3/uL (ref 0.00–0.07)
Basophils Absolute: 0.1 10*3/uL (ref 0.0–0.1)
Basophils Relative: 2 %
Eosinophils Absolute: 0.6 10*3/uL — ABNORMAL HIGH (ref 0.0–0.5)
Eosinophils Relative: 11 %
HCT: 32.2 % — ABNORMAL LOW (ref 39.0–52.0)
Hemoglobin: 11.6 g/dL — ABNORMAL LOW (ref 13.0–17.0)
Immature Granulocytes: 0 %
Lymphocytes Relative: 29 %
Lymphs Abs: 1.5 10*3/uL (ref 0.7–4.0)
MCH: 32.6 pg (ref 26.0–34.0)
MCHC: 36 g/dL (ref 30.0–36.0)
MCV: 90.4 fL (ref 80.0–100.0)
Monocytes Absolute: 0.7 10*3/uL (ref 0.1–1.0)
Monocytes Relative: 13 %
Neutro Abs: 2.4 10*3/uL (ref 1.7–7.7)
Neutrophils Relative %: 45 %
Platelets: 234 10*3/uL (ref 150–400)
RBC: 3.56 MIL/uL — ABNORMAL LOW (ref 4.22–5.81)
RDW: 12.3 % (ref 11.5–15.5)
WBC: 5.3 10*3/uL (ref 4.0–10.5)
nRBC: 0 % (ref 0.0–0.2)

## 2022-01-19 LAB — PROTIME-INR
INR: 2.7 — ABNORMAL HIGH (ref 0.8–1.2)
Prothrombin Time: 28.6 seconds — ABNORMAL HIGH (ref 11.4–15.2)

## 2022-01-19 LAB — SODIUM, URINE, RANDOM: Sodium, Ur: 142 mmol/L

## 2022-01-19 LAB — URINALYSIS, ROUTINE W REFLEX MICROSCOPIC
Bilirubin Urine: NEGATIVE
Glucose, UA: NEGATIVE mg/dL
Hgb urine dipstick: NEGATIVE
Ketones, ur: NEGATIVE mg/dL
Leukocytes,Ua: NEGATIVE
Nitrite: NEGATIVE
Protein, ur: NEGATIVE mg/dL
Specific Gravity, Urine: 1.01 (ref 1.005–1.030)
pH: 7 (ref 5.0–8.0)

## 2022-01-19 LAB — OSMOLALITY, URINE: Osmolality, Ur: 436 mOsm/kg (ref 300–900)

## 2022-01-19 LAB — TSH: TSH: 2.685 u[IU]/mL (ref 0.350–4.500)

## 2022-01-19 LAB — OSMOLALITY: Osmolality: 258 mOsm/kg — ABNORMAL LOW (ref 275–295)

## 2022-01-19 LAB — CORTISOL-AM, BLOOD: Cortisol - AM: 3.6 ug/dL — ABNORMAL LOW (ref 6.7–22.6)

## 2022-01-19 MED ORDER — LEVETIRACETAM ER 750 MG PO TB24
2250.0000 mg | ORAL_TABLET | Freq: Every day | ORAL | Status: DC
Start: 2022-01-19 — End: 2022-01-27
  Administered 2022-01-19 – 2022-01-26 (×8): 2250 mg via ORAL
  Filled 2022-01-19 (×9): qty 3

## 2022-01-19 MED ORDER — WARFARIN SODIUM 2.5 MG PO TABS
2.5000 mg | ORAL_TABLET | ORAL | Status: DC
  Administered 2022-01-19 – 2022-01-20 (×2): 2.5 mg via ORAL
  Filled 2022-01-19 (×2): qty 1

## 2022-01-19 MED ORDER — LACOSAMIDE 50 MG PO TABS
200.0000 mg | ORAL_TABLET | Freq: Two times a day (BID) | ORAL | Status: DC
  Administered 2022-01-19 – 2022-01-27 (×16): 200 mg via ORAL
  Filled 2022-01-19 (×16): qty 4

## 2022-01-19 MED ORDER — WARFARIN - PHARMACIST DOSING INPATIENT: Freq: Every day | Status: DC

## 2022-01-19 MED ORDER — WARFARIN SODIUM 2 MG PO TABS: 2.0000 mg | ORAL_TABLET | ORAL | Status: DC

## 2022-01-19 MED ORDER — BRIMONIDINE TARTRATE 0.2 % OP SOLN
1.0000 [drp] | Freq: Two times a day (BID) | OPHTHALMIC | Status: DC
  Administered 2022-01-19 – 2022-01-27 (×16): 1 [drp] via OPHTHALMIC
  Filled 2022-01-19: qty 5

## 2022-01-19 MED ORDER — CEPHALEXIN 250 MG PO CAPS
500.0000 mg | ORAL_CAPSULE | Freq: Three times a day (TID) | ORAL | Status: DC
  Administered 2022-01-19 – 2022-01-27 (×23): 500 mg via ORAL
  Filled 2022-01-19 (×28): qty 2

## 2022-01-19 MED ORDER — PHENYTOIN SODIUM EXTENDED 100 MG PO CAPS
300.0000 mg | ORAL_CAPSULE | Freq: Every day | ORAL | Status: DC
  Administered 2022-01-19 – 2022-01-26 (×8): 300 mg via ORAL
  Filled 2022-01-19 (×9): qty 3

## 2022-01-19 MED ORDER — ACETAMINOPHEN 325 MG PO TABS: 650.0000 mg | ORAL_TABLET | Freq: Four times a day (QID) | ORAL | Status: DC | PRN

## 2022-01-19 MED ORDER — TIMOLOL MALEATE 0.5 % OP SOLN
1.0000 [drp] | Freq: Two times a day (BID) | OPHTHALMIC | Status: DC
Start: 2022-01-19 — End: 2022-01-27
  Administered 2022-01-19 – 2022-01-27 (×16): 1 [drp] via OPHTHALMIC
  Filled 2022-01-19: qty 5

## 2022-01-19 MED ORDER — TAMSULOSIN HCL 0.4 MG PO CAPS
0.4000 mg | ORAL_CAPSULE | Freq: Every day | ORAL | Status: DC
  Administered 2022-01-20 – 2022-01-27 (×8): 0.4 mg via ORAL
  Filled 2022-01-19 (×8): qty 1

## 2022-01-19 MED ORDER — SODIUM CHLORIDE 0.9 % IV SOLN: 250.0000 mL | INTRAVENOUS | Status: DC | PRN

## 2022-01-19 MED ORDER — SODIUM CHLORIDE 1 G PO TABS
1.0000 g | ORAL_TABLET | Freq: Three times a day (TID) | ORAL | Status: DC
  Administered 2022-01-19: 1 g via ORAL
  Filled 2022-01-19: qty 1

## 2022-01-19 MED ORDER — ACETAMINOPHEN 650 MG RE SUPP: 650.0000 mg | Freq: Four times a day (QID) | RECTAL | Status: DC | PRN

## 2022-01-19 MED ORDER — CALCIUM CARBONATE ANTACID 500 MG PO CHEW
1000.0000 mg | CHEWABLE_TABLET | Freq: Four times a day (QID) | ORAL | Status: DC | PRN
Start: 2022-01-19 — End: 2022-01-27

## 2022-01-19 MED ORDER — SODIUM CHLORIDE 0.9% FLUSH: 3.0000 mL | INTRAVENOUS | Status: DC | PRN

## 2022-01-19 MED ORDER — FLECAINIDE ACETATE 50 MG PO TABS
50.0000 mg | ORAL_TABLET | Freq: Two times a day (BID) | ORAL | Status: DC
  Administered 2022-01-19 – 2022-01-27 (×16): 50 mg via ORAL
  Filled 2022-01-19 (×19): qty 1

## 2022-01-19 MED ORDER — FINASTERIDE 5 MG PO TABS
5.0000 mg | ORAL_TABLET | Freq: Every morning | ORAL | Status: DC
  Administered 2022-01-20 – 2022-01-27 (×8): 5 mg via ORAL
  Filled 2022-01-19 (×8): qty 1

## 2022-01-19 MED ORDER — SODIUM CHLORIDE 0.9 % IV SOLN
2.0000 g | Freq: Once | INTRAVENOUS | Status: AC
  Administered 2022-01-19: 2 g via INTRAVENOUS
  Filled 2022-01-19: qty 20

## 2022-01-19 MED ORDER — ALPRAZOLAM 0.5 MG PO TABS
0.2500 mg | ORAL_TABLET | Freq: Two times a day (BID) | ORAL | Status: DC
  Administered 2022-01-19 – 2022-01-27 (×16): 0.25 mg via ORAL
  Filled 2022-01-19 (×16): qty 1

## 2022-01-19 MED ORDER — SODIUM CHLORIDE 0.9% FLUSH
3.0000 mL | Freq: Two times a day (BID) | INTRAVENOUS | Status: DC
  Administered 2022-01-19 – 2022-01-27 (×16): 3 mL via INTRAVENOUS

## 2022-01-19 MED ORDER — LATANOPROST 0.005 % OP SOLN
1.0000 [drp] | Freq: Every day | OPHTHALMIC | Status: DC
Start: 2022-01-19 — End: 2022-01-27
  Administered 2022-01-19 – 2022-01-26 (×8): 1 [drp] via OPHTHALMIC
  Filled 2022-01-19: qty 2.5

## 2022-01-19 MED ORDER — BRIMONIDINE TARTRATE-TIMOLOL 0.2-0.5 % OP SOLN
1.0000 [drp] | Freq: Two times a day (BID) | OPHTHALMIC | Status: DC
Start: 2022-01-19 — End: 2022-01-19

## 2022-01-19 MED ORDER — PHENYTOIN 50 MG PO CHEW
75.0000 mg | CHEWABLE_TABLET | Freq: Every day | ORAL | Status: DC
  Administered 2022-01-20 – 2022-01-27 (×8): 75 mg via ORAL
  Filled 2022-01-19 (×9): qty 1.5

## 2022-01-19 NOTE — H&P (Signed)
History and Physical    Patient: Cameron Potts HGD:[id number] DOB: 06-Nov-1935 DOA: 01/19/2022 DOS: the patient was seen and examined on 01/19/2022 PCP: [name], MD  Patient coming from: ALF/ILF - [facility]. Uses walker with ambulation    Chief Complaint: abnormal lab  HPI: Juluis Fitzsimmons III is a 86 y.o. male with medical history significant of atrial fibrillation on coumadin, HLD, hx of seizures, PD, BPH and hyponatremia presenting to ED with complaints of abnormal lab. He had labs checked at his Pcp and sodium was trending downwards. He had labs done yesterday and sodium was 125 so advised to come to Ed. He was seen in ED a few days ago for a fall, but states he falls from time to time with his PD. He states he hasn't felt as good as he normally does since early today. He denies feeling more tired than normal. He denies any confusion.   He hit his head on Monday after a fall and grandson states he was his normal self. He went to see him this AM and he thought he was slow and seemed confused. This has happened in the past. He was at the hospital and seemed back to his normal self.   Being treated for cellulitis of his left lower leg with keflex. Denies any trauma to this leg.   Denies any fever/chills, vision changes/headaches, chest pain or palpitations, shortness of breath or cough, abdominal pain, N/V/D, dysuria.    He does not smoke or drink.   ER Course:  vitals: temp: 96.3 bp: 174/87, HR; 60, RR: 14, oxygen: 100%RA Pertinent labs: hgb: 11.6, sodium: 123,  CXR: subtle opacities and patchy opacities in chest greatest in the left mid chest and lower chest. Could represent asymmetric edema or atypical infection.  Ct head: no acute finding. Mild right occipital scalp soft tissue swelling CT chest: no focal consolidation, pleural effusion or vascular congestion. Interstitial changes at lung bases. Mild cardiomegaly. 2.2 x1.6cm hypodense lesion in right thyroid lobe.  Recommend thyroid US.  In ED: started on rocephin, [facility] asked to admit.    Review of Systems: As mentioned in the history of present illness. All other systems reviewed and are negative. 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 He has never used smokeless tobacco. He reports that he does not currently use alcohol. He reports that he does not use drugs.  Allergies  Allergen Reactions  Depakote [Divalproex Sodium] Other (See Comments)    Arthralgias     Family History  Problem Relation Age of Onset   Cancer - Lung Mother    Cancer - Prostate Father     Prior to Admission medications   Medication Sig Start Date End Date Taking? Authorizing Provider  ALPRAZolam (XANAX) 0.25 MG tablet Take 1 tablet (0.25 mg total) by mouth 2 (two) times daily. 11/23/21   Royal Hawthorn, NP  brimonidine-timolol (COMBIGAN) 0.2-0.5 % ophthalmic solution Place 1 drop into both eyes every 12 (twelve) hours.  01/05/18   [provider]  calcium carbonate (TUMS SMOOTHIES) 750 MG chewable tablet Chew 2 tablets by mouth 4 (four) times daily as needed for  heartburn.    [provider]  cephALEXin (KEFLEX) 500 MG capsule Take 1 capsule (500 mg total) by mouth 3 (three) times daily for 10 days. 01/18/22 01/28/22  Royal Hawthorn, NP  cholecalciferol (VITAMIN D) 400 units TABS tablet Take 400 Units by mouth 2 (two) times daily.    [provider]  clobetasol cream (TEMOVATE) 0.10 % Apply 1 application topically 2 (two) times daily as needed (For Flares).    [provider]  clotrimazole (LOTRIMIN) 1 % cream Apply 1 Application topically 2 (two) times daily.    [provider]  econazole nitrate 1 % cream Apply topically as needed. Twice a day    [provider]  finasteride (PROSCAR) 5 MG tablet Take 5 mg by mouth every morning.  02/12/19   [provider]  flecainide (TAMBOCOR) 50 MG tablet Take 50 mg by mouth 2 (two) times daily.    [provider]  fluocinonide ointment (LIDEX) 2.72 % Apply 1 Application topically 2 (two) times daily.    [provider]  ketoconazole (NIZORAL) 2 % shampoo Apply 1 Application topically 2 (two) times a week.    [provider]  lacosamide (VIMPAT) 200 MG TABS tablet Take 1 tablet (200 mg total) by mouth 2 (two) times daily. 09/13/21 10/13/21  Alric Ran, MD  latanoprost (XALATAN) 0.005 % ophthalmic solution Place 1 drop into both eyes at bedtime.    [provider]  Levetiracetam 750 MG TB24 Take 2,250 mg by mouth daily. Take three tabs daily at 6pm    [provider]  LORazepam (ATIVAN) 2 MG/ML concentrated solution Give 2 mg IM for seizure.If continued seizure activity after 15 minutes, give 2 mg again as needed. 07/09/21   [name], MD  LORazepam (LORAZEPAM INTENSOL) 2 MG/ML concentrated solution Take 0.5 mLs (1 mg total) by mouth daily as needed (postictal anxiety). 10/22/21   Royal Hawthorn, NP  miconazole (MICOTIN) 2 % powder Apply topically 2 (two) times daily.    [provider]  phenytoin (DILANTIN)  100 MG ER capsule Take 300 mg by mouth at bedtime. Special instruction: Dilantin 100 mg take 3 capsules (300) mg in the evening. Once an evening 06:30    [provider]  phenytoin (DILANTIN) 50 MG tablet Chew 75 mg by mouth daily. Chew 1.5 tablets (75 mg) by mouth daily one a morning (6:30) am    [provider]  polyethylene glycol (MIRALAX / GLYCOLAX) packet Take 17 g by mouth See admin instructions. Once daily every other day and as needed for constipation    [provider]  polyethylene glycol powder (GLYCOLAX/MIRALAX) 17 GM/SCOOP powder Take by mouth.    [provider]  sodium chloride 1 g tablet Take 1 tablet (1 g  total) by mouth 2 (two) times daily with a meal. 02/01/19   Monica Becton, MD  tamsulosin (FLOMAX) 0.4 MG CAPS capsule Take 0.4 mg by mouth.    [provider]  triamcinolone cream (KENALOG) 0.1 % Apply 1 Application topically 2 (two) times daily.    [provider]  warfarin (COUMADIN) 2.5 MG tablet Take 1 tablet (2.5 mg total) by mouth daily at 12 noon. Patient taking differently: Take 2.5 mg by mouth daily at 12 noon. 2.5 mg qd except 2 mg on wednesday 02/12/19   Gayland Curry, DO    Physical Exam: Vitals:   01/19/22 1130 01/19/22 1131 01/19/22 1145 01/19/22 1200  BP: (!) 157/44  (!) 108/93 (!) 158/143  Pulse: (!) 59  (!) 59 60  Resp: '15  18 14  '$ Temp:  (!) 96.3 F (35.7 C)    TempSrc:  Rectal    SpO2: 100%  99% 100%   General:  Appears calm and comfortable and is in NAD Eyes:  PERRL, EOMI, normal lids, iris ENT:  grossly normal hearing, lips & tongue, mmm; appropriate dentition Neck:  no LAD, masses or thyromegaly; no carotid bruits Cardiovascular:  RRR, no m/r/g.  Respiratory:   CTA bilaterally with no wheezes/rales/rhonchi.  Normal respiratory effort. Abdomen:  soft, NT, ND, NABS Back:   normal alignment, no CVAT Skin:  no rash or induration seen on limited exam. LLE with erythema and edema.    Musculoskeletal:  grossly normal tone BUE/BLE, good ROM, no bony abnormality Lower extremity:    Limited foot exam with no ulcerations.  2+ distal pulses. Psychiatric:  grossly normal mood and affect, speech fluent and appropriate, AOx3 Neurologic:  CN 2-12 grossly intact, moves all extremities in coordinated fashion, sensation intact   Radiological Exams on Admission: Independently reviewed - see discussion in A/P where applicable  No results found.  EKG: Independently reviewed.  NSR with rate 60; nonspecific ST changes with no evidence of acute ischemia. LBBB ekg from 8/1    Labs on Admission: I have personally reviewed the available labs and imaging studies at the time of the admission.  Pertinent labs:   hgb: 11.6, sodium: 123,   Assessment and Plan: Principal Problem:   Hyponatremia Active Problems:   Cellulitis of left lower leg   Thyroid nodule   Seizure disorder (HCC)   Chronic atrial fibrillation   Parkinsonian syndrome associated with symptomatic orthostatic hypotension (HCC)   Benign prostatic hyperplasia with urinary frequency    Assessment and Plan: * Hyponatremia Long history of hyponatremia dating back to 2012. Typically above 130, lowest 124 when hospitalized with aspiration pneumonia in 2019.  Taking 1g salt tablets BID, medication given at AL so he is compliant with tablets Denies any excess free water intake or exogenous losses  Appears euvolemic Previous sodium levels within normal limit with recent down trending from 129>125>123 today.  He is alert and oriented, but states he doesn't feel great.  Will repeat hyponatremia studies meds reviewed, keppra could be contributing UA pending Strict I/O Fluid restriction Increase salt tablets to TID Bmp q 6 hours, if continues to drop will consult nephrology, if less than 120 and symptomatic call ICU   Cellulitis of left lower leg He has no leukocytosis, elevated HR/RR Mild disease, continue keflex.   Received 2g rocephin in ED   Thyroid nodule Incidental finding on CT chest. 2.2x1.6cm Recommend thyroid US-pending   Seizure disorder (HCC) Seizure precautions  Continue AED: vimpat, keppra, dilantin    Chronic  atrial fibrillation Continue flecainide and warfarin Pharmacy to dose coumadin -check INR   Parkinsonian syndrome associated with symptomatic orthostatic hypotension (HCC) Hx of PD and orthostasis with falls Fall precautions   Benign prostatic hyperplasia with urinary frequency Continue proscar and flomax     Advance Care Planning:   Code Status: DNR   Consults: none   DVT Prophylaxis: coumadin   Family Communication:   Severity of Illness: The appropriate patient status for this patient is OBSERVATION. Observation status is judged to be reasonable and necessary in order to provide the required intensity of service to ensure the patient's safety. The patient's presenting symptoms, physical exam findings, and initial radiographic and laboratory data in the context of their medical condition is felt to place them at decreased risk for further clinical deterioration. Furthermore, it is anticipated that the patient will be medically stable for discharge from the hospital within 2 midnights of admission.   Author: Orma Flaming, MD 01/19/2022 3:35 PM  For on call review [email or web address].

## 2022-01-19 NOTE — ED Provider Notes (Signed)
[facility] EMERGENCY DEPARTMENT Provider Note   CSN: [id number] Arrival date & time: 01/19/22  1034     History  Chief Complaint  Patient presents with   Abnormal Lab    Cameron Potts is a 86 y.o. male.  86 yo M with a chief complaints of hyponatremia.  The patient was recently seen in the hospital after suffering a fall and was noted to have a low sodium.  This was rechecked at his skilled nursing facility and was found to have downward trended.  Sodium checked yesterday was 125 and he was sent here for evaluation.  There was some documented confusion that the patient denies any confusion.  Tells me he has developed some redness and swelling to the right lower extremity and was recently started on antibiotics.  He is not sure what medication.  Denies any fevers denies chest pain denies headache denies confusion.   Abnormal Lab      Home Medications Prior to Admission medications   Medication Sig Start Date End Date Taking? Authorizing Provider  brimonidine-timolol (COMBIGAN) 0.2-0.5 % ophthalmic solution Place 1 drop into both eyes every 12 (twelve) hours.  01/05/18  Yes [provider]  cephALEXin (KEFLEX) 500 MG capsule Take 1 capsule (500 mg total) by mouth 3 (three) times daily for 10 days. 01/18/22 01/28/22 Yes Wert, Margreta Journey, NP  clobetasol cream (TEMOVATE) 3.22 % Apply 1 application  topically 2 (two) times daily as needed (rash).   Yes [provider]  econazole nitrate 1 % cream Apply 1 Application topically 2 (two) times daily.   Yes [provider]  finasteride (PROSCAR) 5 MG tablet Take 5 mg by mouth every morning.  02/12/19  Yes [provider]  flecainide (TAMBOCOR) 50 MG tablet Take 50 mg by mouth 2 (two) times daily.   Yes [provider]  fluocinonide ointment (LIDEX) 0.25 % Apply 1 Application topically 2 (two) times daily as needed (rash).   Yes [provider]  latanoprost (XALATAN)  0.005 % ophthalmic solution Place 1 drop into both eyes at bedtime.   Yes [provider]  Levetiracetam 750 MG TB24 Take 2,250 mg by mouth daily.   Yes [provider]  LORazepam (ATIVAN) 2 MG/ML concentrated solution Give 2 mg IM for seizure.If continued seizure activity after 15 minutes, give 2 mg again as needed. 07/09/21  Yes [name], MD  LORazepam (LORAZEPAM INTENSOL) 2 MG/ML concentrated solution Take 0.5 mLs (1 mg total) by mouth daily as needed (postictal anxiety). 10/22/21  Yes Wert, Margreta Journey, NP  phenytoin (DILANTIN) 100 MG ER capsule Take 300 mg by mouth at bedtime.   Yes [provider]  polyethylene glycol (MIRALAX / GLYCOLAX) packet Take 17 g by mouth every other day.   Yes [provider]  ALPRAZolam (XANAX) 0.25 MG tablet Take 1 tablet (0.25 mg total) by mouth 2 (two) times daily. 11/23/21   Royal Hawthorn, NP  calcium carbonate (TUMS SMOOTHIES) 750 MG chewable tablet Chew 2 tablets by mouth 4 (four) times daily as needed for heartburn.    [provider]  cholecalciferol (VITAMIN D) 400 units TABS tablet Take 400 Units by mouth 2 (two) times daily.    [provider]  lacosamide (VIMPAT) 200 MG TABS tablet Take 1 tablet (200 mg total) by mouth 2 (two) times daily. 09/13/21 10/13/21  Alric Ran, MD  miconazole (MICOTIN) 2 % powder Apply topically 2 (two) times daily.    [provider]  phenytoin (DILANTIN) 50 MG tablet Chew 75 mg by mouth daily. Chew 1.5 tablets (75 mg) by mouth daily one a morning (6:30) am    [provider]  sodium chloride 1 g tablet Take 1 tablet (1 g total) by mouth 2 (two) times daily with a meal. 02/01/19   Vasireddy, Grier Mitts, MD  tamsulosin (FLOMAX) 0.4 MG CAPS capsule Take 0.4 mg by mouth.    [provider]  triamcinolone cream (KENALOG) 0.1 % Apply 1 Application topically 2 (two) times daily.    [provider]  warfarin (COUMADIN) 2.5 MG tablet Take 1 tablet  (2.5 mg total) by mouth daily at 12 noon. Patient taking differently: Take 2.5 mg by mouth daily at 12 noon. 2.5 mg qd except 2 mg on wednesday 02/12/19   Reed, Tiffany L, DO      Allergies    Depakote [divalproex sodium]    Review of Systems   Review of Systems  Physical Exam Updated Vital Signs BP (!) 158/143   Pulse 60   Temp (!) 96.3 F (35.7 C) (Rectal)   Resp 14   SpO2 100%  Physical Exam Vitals and nursing note reviewed.  Constitutional:      Appearance: He is well-developed.  HENT:     Head: Normocephalic and atraumatic.  Eyes:     Pupils: Pupils are equal, round, and reactive to light.  Neck:     Vascular: No JVD.  Cardiovascular:     Rate and Rhythm: Normal rate and regular rhythm.     Heart sounds: No murmur heard.    No friction rub. No gallop.  Pulmonary:     Effort: No respiratory distress.     Breath sounds: No wheezing.  Abdominal:     General: There is no distension.     Tenderness: There is no abdominal tenderness. There is no guarding or rebound.  Musculoskeletal:        General: Swelling present. Normal range of motion.     Cervical back: Normal range of motion and neck supple.     Comments: Redness and swelling of the right lower extremity up to the mid calf.  Warm to touch.  Skin:    Coloration: Skin is not pale.     Findings: No rash.  Neurological:     Mental Status: He is alert and oriented to person, place, and time.  Psychiatric:        Behavior: Behavior normal.     ED Results / Procedures / Treatments   Labs (all labs ordered are listed, but only abnormal results are displayed) Labs Reviewed  CBC WITH DIFFERENTIAL/PLATELET - Abnormal; Notable for the following components:      Result Value   RBC 3.56 (*)    Hemoglobin 11.6 (*)    HCT 32.2 (*)    Eosinophils Absolute 0.6 (*)    All other components within normal limits  BASIC METABOLIC PANEL - Abnormal; Notable for the following components:   Sodium 123 (*)    Chloride 95 (*)     Glucose, Bld 104 (*)    Calcium 8.4 (*)    All other components within normal limits  I-STAT CHEM 8, ED - Abnormal; Notable for the following components:   Sodium 123 (*)    Chloride 92 (*)    Creatinine, Ser 0.50 (*)    Glucose, Bld 101 (*)    Calcium, Ion 1.11 (*)    Hemoglobin 11.2 (*)    HCT 33.0 (*)  All other components within normal limits  SODIUM, URINE, RANDOM  OSMOLALITY, URINE  OSMOLALITY  TSH  URINALYSIS, ROUTINE W REFLEX MICROSCOPIC  BASIC METABOLIC PANEL  BASIC METABOLIC PANEL  BASIC METABOLIC PANEL    EKG None  Radiology No results found.  Procedures Procedures    Medications Ordered in ED Medications  cefTRIAXone (ROCEPHIN) 2 g in sodium chloride 0.9 % 100 mL IVPB (2 g Intravenous New Bag/Given 01/19/22 1453)    ED Course/ Medical Decision Making/ A&P                           Medical Decision Making Amount and/or Complexity of Data Reviewed Labs: ordered.  Risk Decision regarding hospitalization.   86 yo M with a chief complaints of a low sodium.  The patient was just in the emergency department after suffering a fall on blood thinners.  At that time was noted to have a low sodium level on recheck at his skilled nursing facility they noted that it had downward trended and he was sent here for evaluation.  Sodium level is 125, there is also documentation of worsening confusion.  He has a history of seizures but denies any seizure-like activity.  On my exam the patient has no appreciable confusion.  He does clinically have cellulitis to the right lower extremity.  Sodium level rechecked here 123.  Appears to be euvolemic or hypervolemic for me.  We will discuss with medicine for admission.  CRITICAL CARE Performed by: Cecilio Asper   Total critical care time: 35 minutes  Critical care time was exclusive of separately billable procedures and treating other patients.  Critical care was necessary to treat or prevent imminent or  life-threatening deterioration.  Critical care was time spent personally by me on the following activities: development of treatment plan with patient and/or surrogate as well as nursing, discussions with consultants, evaluation of patient's response to treatment, examination of patient, obtaining history from patient or surrogate, ordering and performing treatments and interventions, ordering and review of laboratory studies, ordering and review of radiographic studies, pulse oximetry and re-evaluation of patient's condition.  The patients results and plan were reviewed and discussed.   Any x-rays performed were independently reviewed by myself.   Differential diagnosis were considered with the presenting HPI.  Medications  cefTRIAXone (ROCEPHIN) 2 g in sodium chloride 0.9 % 100 mL IVPB (2 g Intravenous New Bag/Given 01/19/22 1453)    Vitals:   01/19/22 1130 01/19/22 1131 01/19/22 1145 01/19/22 1200  BP: (!) 157/44  (!) 108/93 (!) 158/143  Pulse: (!) 59  (!) 59 60  Resp: '15  18 14  '$ Temp:  (!) 96.3 F (35.7 C)    TempSrc:  Rectal    SpO2: 100%  99% 100%    Final diagnoses:  Hyponatremia    Admission/ observation were discussed with the admitting physician, patient and/or family and they are comfortable with the plan.          Final Clinical Impression(s) / ED Diagnoses Final diagnoses:  Hyponatremia    Rx / DC Orders ED Discharge Orders     None         Deno Etienne, DO 01/19/22 1507

## 2022-01-19 NOTE — Assessment & Plan Note (Addendum)
Long history of hyponatremia dating back to 2012. Typically above 130, lowest 124 when hospitalized with aspiration pneumonia in 2019.  Taking 1g salt tablets BID, medication given at AL so he is compliant with tablets Denies any excess free water intake or exogenous losses  Appears euvolemic Previous sodium levels within normal limit with recent down trending from 129>125>123 today.  He is alert and oriented, but states he doesn't feel great.  Will repeat hyponatremia studies meds reviewed, keppra could be contributing UA pending Strict I/O Fluid restriction Increase salt tablets to TID Bmp q 6 hours, if continues to drop will consult nephrology, if less than 120 and symptomatic call ICU

## 2022-01-19 NOTE — Assessment & Plan Note (Signed)
Continue proscar and flomax  

## 2022-01-19 NOTE — Assessment & Plan Note (Signed)
Seizure precautions  Continue AED: vimpat, keppra, dilantin

## 2022-01-19 NOTE — Assessment & Plan Note (Signed)
He has no leukocytosis, elevated HR/RR Mild disease, continue keflex.  Received 2g rocephin in ED

## 2022-01-19 NOTE — Assessment & Plan Note (Addendum)
Continue flecainide and warfarin Pharmacy to dose coumadin -check INR

## 2022-01-19 NOTE — ED Notes (Signed)
Admitting Dr. Eliberto Ivory at [facility]. Pt alert, NAD, calm, interactive.

## 2022-01-19 NOTE — Assessment & Plan Note (Signed)
Hx of PD and orthostasis with falls Fall precautions

## 2022-01-19 NOTE — Telephone Encounter (Signed)
Called and reported Na 125, the patient is more confused today. The patient  had ED visit 01/14/22 for head injury, CT head was negative of acute intracranial changes. The patient is on Coumadin also. Suggested ED for further evaluation and treatment.

## 2022-01-19 NOTE — ED Triage Notes (Addendum)
Pt bib ems from [facility]; fall on Monday, was evaluated at [facility], discharged; labs done yesterday, reports dilantin and sodium levels low; goal dilantin level 18-20, current level 13.4; current sodium 125; hx seizures; CBG 133, 130/88, P 75, O2 99% RA; pt a and o on arrival

## 2022-01-19 NOTE — ED Notes (Signed)
Given emesis bag for phlegm, and tissue per request

## 2022-01-19 NOTE — Assessment & Plan Note (Signed)
Incidental finding on CT chest. 2.2x1.6cm Recommend thyroid US-pending

## 2022-01-19 NOTE — Progress Notes (Signed)
ANTICOAGULATION CONSULT NOTE - Initial Consult  Pharmacy Consult for Warfarin Indication: atrial fibrillation  Allergies  Allergen Reactions   Depakote [Divalproex Sodium] Other (See Comments)    Arthralgias     Vital Signs: Temp: 97.4 F (36.3 C) (08/05 1700) Temp Source: Oral (08/05 1700) BP: 175/53 (08/05 1700) Pulse Rate: 60 (08/05 1700)  Labs: Recent Labs    01/19/22 1139 01/19/22 1143 01/19/22 1658  HGB 11.6* 11.2*  --   HCT 32.2* 33.0*  --   PLT 234  --   --   LABPROT  --   --  28.6*  INR  --   --  2.7*  CREATININE 0.65 0.50* 0.62    Estimated Creatinine Clearance: 65.8 mL/min (by C-G formula based on SCr of 0.62 mg/dL).   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[place])    Urinary urgency     Medications:  Scheduled:   ALPRAZolam  0.25 mg Oral BID   brimonidine  1 drop Both Eyes BID   cephALEXin  500 mg Oral TID   [START ON 01/20/2022] finasteride  5 mg Oral q morning   flecainide  50 mg Oral BID   lacosamide  200 mg Oral BID   latanoprost  1 drop Both Eyes QHS   levETIRAcetam  2,250 mg Oral Daily   [START ON 01/20/2022] phenytoin  75 mg Oral Daily   phenytoin  300 mg Oral QHS   sodium chloride flush  3 mL Intravenous Q12H   sodium chloride  1 g Oral TID WC   [START ON 01/20/2022] tamsulosin  0.4 mg Oral Daily   timolol  1 drop Both Eyes BID   warfarin  2.5 mg Oral Once per day on Sun Mon Tue Thu Fri Sat   And   [START ON 01/23/2022] warfarin  2 mg Oral Once per day on Wed   [START ON 01/20/2022] Warfarin -  Pharmacist Dosing Inpatient   Does not apply q1600    Assessment: 86 yo male with chronic afib on PTA warfarin, HLD, hx of seizures, PD, and BPH admitted for hyponatremia and cellulitis of left leg. Pharmacy consulted to dose warfarin.  Patient PTA warfarin regimen: 2.5 mg daily, except Wednesdays 2 mg. Last dose taken 8/4. INR therapeutic 2.7. No new drug interactions.   Goal of Therapy:  INR 2-3 Monitor platelets by anticoagulation protocol: Yes   Plan:  Continue PTA warfarin regimen for now  Warfarin 2.5 mg now  Warfarin 2.5 mg daily, except Wednesdays 2 mg Daily INR, CBC, s/sx of bleeding  Francena Hanly, PharmD Pharmacy Resident  01/19/2022 5:50 PM

## 2022-01-20 DIAGNOSIS — H5462 Unqualified visual loss, left eye, normal vision right eye: Secondary | ICD-10-CM | POA: Diagnosis present

## 2022-01-20 DIAGNOSIS — E041 Nontoxic single thyroid nodule: Secondary | ICD-10-CM | POA: Diagnosis present

## 2022-01-20 DIAGNOSIS — E871 Hypo-osmolality and hyponatremia: Secondary | ICD-10-CM | POA: Diagnosis present

## 2022-01-20 DIAGNOSIS — R569 Unspecified convulsions: Secondary | ICD-10-CM | POA: Diagnosis not present

## 2022-01-20 DIAGNOSIS — K5900 Constipation, unspecified: Secondary | ICD-10-CM | POA: Diagnosis present

## 2022-01-20 DIAGNOSIS — R350 Frequency of micturition: Secondary | ICD-10-CM | POA: Diagnosis present

## 2022-01-20 DIAGNOSIS — G40909 Epilepsy, unspecified, not intractable, without status epilepticus: Secondary | ICD-10-CM | POA: Diagnosis present

## 2022-01-20 DIAGNOSIS — G9341 Metabolic encephalopathy: Secondary | ICD-10-CM | POA: Diagnosis present

## 2022-01-20 DIAGNOSIS — L03115 Cellulitis of right lower limb: Secondary | ICD-10-CM | POA: Diagnosis present

## 2022-01-20 DIAGNOSIS — G20 Parkinson's disease: Secondary | ICD-10-CM | POA: Diagnosis present

## 2022-01-20 DIAGNOSIS — E7800 Pure hypercholesterolemia, unspecified: Secondary | ICD-10-CM | POA: Diagnosis present

## 2022-01-20 DIAGNOSIS — R4182 Altered mental status, unspecified: Secondary | ICD-10-CM | POA: Diagnosis not present

## 2022-01-20 DIAGNOSIS — Z9689 Presence of other specified functional implants: Secondary | ICD-10-CM | POA: Diagnosis present

## 2022-01-20 DIAGNOSIS — Z7901 Long term (current) use of anticoagulants: Secondary | ICD-10-CM | POA: Diagnosis not present

## 2022-01-20 DIAGNOSIS — I482 Chronic atrial fibrillation, unspecified: Secondary | ICD-10-CM | POA: Diagnosis present

## 2022-01-20 DIAGNOSIS — Z79899 Other long term (current) drug therapy: Secondary | ICD-10-CM | POA: Diagnosis not present

## 2022-01-20 DIAGNOSIS — Z95810 Presence of automatic (implantable) cardiac defibrillator: Secondary | ICD-10-CM | POA: Diagnosis not present

## 2022-01-20 DIAGNOSIS — Z86002 Personal history of in-situ neoplasm of other and unspecified genital organs: Secondary | ICD-10-CM | POA: Diagnosis not present

## 2022-01-20 DIAGNOSIS — H409 Unspecified glaucoma: Secondary | ICD-10-CM | POA: Diagnosis present

## 2022-01-20 DIAGNOSIS — Z888 Allergy status to other drugs, medicaments and biological substances status: Secondary | ICD-10-CM | POA: Diagnosis not present

## 2022-01-20 DIAGNOSIS — I10 Essential (primary) hypertension: Secondary | ICD-10-CM | POA: Diagnosis not present

## 2022-01-20 DIAGNOSIS — L03116 Cellulitis of left lower limb: Secondary | ICD-10-CM | POA: Diagnosis present

## 2022-01-20 DIAGNOSIS — N401 Enlarged prostate with lower urinary tract symptoms: Secondary | ICD-10-CM | POA: Diagnosis present

## 2022-01-20 DIAGNOSIS — Z66 Do not resuscitate: Secondary | ICD-10-CM | POA: Diagnosis present

## 2022-01-20 DIAGNOSIS — Z8674 Personal history of sudden cardiac arrest: Secondary | ICD-10-CM | POA: Diagnosis not present

## 2022-01-20 LAB — BASIC METABOLIC PANEL
Anion gap: 10 (ref 5–15)
Anion gap: 8 (ref 5–15)
BUN: 7 mg/dL — ABNORMAL LOW (ref 8–23)
BUN: 10 mg/dL (ref 8–23)
CO2: 20 mmol/L — ABNORMAL LOW (ref 22–32)
CO2: 21 mmol/L — ABNORMAL LOW (ref 22–32)
Calcium: 8.3 mg/dL — ABNORMAL LOW (ref 8.9–10.3)
Calcium: 8.4 mg/dL — ABNORMAL LOW (ref 8.9–10.3)
Chloride: 92 mmol/L — ABNORMAL LOW (ref 98–111)
Chloride: 94 mmol/L — ABNORMAL LOW (ref 98–111)
Creatinine, Ser: 0.6 mg/dL — ABNORMAL LOW (ref 0.61–1.24)
Creatinine, Ser: 0.74 mg/dL (ref 0.61–1.24)
GFR, Estimated: >60 mL/min (ref >60)
GFR, Estimated: >60 mL/min (ref >60)
Glucose, Bld: 97 mg/dL (ref 70–99)
Glucose, Bld: 123 mg/dL — ABNORMAL HIGH (ref 70–99)
Potassium: 4.1 mmol/L (ref 3.5–5.1)
Potassium: 4.2 mmol/L (ref 3.5–5.1)
Sodium: 122 mmol/L — ABNORMAL LOW (ref 135–145)
Sodium: 123 mmol/L — ABNORMAL LOW (ref 135–145)

## 2022-01-20 LAB — CBC
HCT: 31.7 % — ABNORMAL LOW (ref 39.0–52.0)
Hemoglobin: 11.7 g/dL — ABNORMAL LOW (ref 13.0–17.0)
MCH: 33.9 pg (ref 26.0–34.0)
MCHC: 36.9 g/dL — ABNORMAL HIGH (ref 30.0–36.0)
MCV: 91.9 fL (ref 80.0–100.0)
Platelets: 213 10*3/uL (ref 150–400)
RBC: 3.45 MIL/uL — ABNORMAL LOW (ref 4.22–5.81)
RDW: 12.5 % (ref 11.5–15.5)
WBC: 4.1 10*3/uL (ref 4.0–10.5)
nRBC: 0 % (ref 0.0–0.2)

## 2022-01-20 LAB — PROTIME-INR
INR: 3.1 — ABNORMAL HIGH (ref 0.8–1.2)
Prothrombin Time: 31.3 seconds — ABNORMAL HIGH (ref 11.4–15.2)

## 2022-01-20 MED ORDER — SODIUM CHLORIDE 1 G PO TABS
2.0000 g | ORAL_TABLET | Freq: Three times a day (TID) | ORAL | Status: DC
  Administered 2022-01-20 – 2022-01-27 (×22): 2 g via ORAL
  Filled 2022-01-20 (×22): qty 2

## 2022-01-20 NOTE — Progress Notes (Addendum)
ANTICOAGULATION CONSULT NOTE - Initial Consult  Pharmacy Consult for Warfarin Indication: atrial fibrillation  Allergies  Allergen Reactions   Depakote [Divalproex Sodium] Other (See Comments)    Arthralgias     Vital Signs: Temp: 97.4 F (36.3 C) (08/06 0400) Temp Source: Oral (08/06 0400) BP: 160/64 (08/06 0400) Pulse Rate: 60 (08/06 0400)  Labs: Recent Labs    01/19/22 1139 01/19/22 1143 01/19/22 1658 01/19/22 2206 01/20/22 0250  HGB 11.6* 11.2*  --   --  11.7*  HCT 32.2* 33.0*  --   --  31.7*  PLT 234  --   --   --  213  LABPROT  --   --  28.6*  --  31.3*  INR  --   --  2.7*  --  3.1*  CREATININE 0.65 0.50* 0.62 0.69 0.60*     Estimated Creatinine Clearance: 65.8 mL/min (A) (by C-G formula based on SCr of 0.6 mg/dL (L)).   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[place])    Urinary urgency     Medications:  Scheduled:   ALPRAZolam  0.25 mg Oral BID   brimonidine  1 drop Both Eyes BID   cephALEXin  500 mg Oral TID   finasteride  5 mg Oral q morning   flecainide  50 mg Oral BID   lacosamide  200 mg Oral BID   latanoprost  1 drop Both Eyes QHS   levETIRAcetam  2,250 mg Oral Daily   phenytoin  75 mg Oral Daily   phenytoin  300 mg Oral QHS   sodium chloride flush  3 mL Intravenous Q12H   sodium chloride  2 g Oral TID WC   tamsulosin  0.4 mg Oral Daily   timolol  1 drop Both Eyes BID   warfarin  2.5 mg Oral Once per day on Sun Mon Tue Thu Fri Sat   And   [START ON 01/23/2022] warfarin  2 mg Oral Once per day  on Wed   Warfarin - Pharmacist Dosing Inpatient   Does not apply q1600    Assessment: 86 yo male with chronic afib on PTA warfarin, HLD, hx of seizures, parkinson's disease, and BPH admitted for hyponatremia and cellulitis of left leg. Pharmacy consulted to dose warfarin.  Patient PTA warfarin regimen: 2.5 mg daily, except Wednesdays 2 mg.   INR today slightly supratherapeutic at 3.1. This variation is not significant enough to change dose at this time. Will continue daily INR monitoring, last 7 INR values have been therapeutic. Patient's hemoglobin and platelets remain stable.   Goal of Therapy:  INR 2-3 Monitor platelets by anticoagulation protocol: Yes   Plan:  Continue PTA warfarin regimen for now  Continue Warfarin 2.5 mg daily, except Wednesdays 2 mg Check daily INR, CBC, and monitor for s/sx of bleeding  Vicenta Dunning, PharmD  PGY1 Pharmacy Resident  -------------------------------------------------------------------------------------------------------------------- Addendum: Dose of coumadin has been this regimen since at least 11/16/2021 from  old EMR encounters viewed.  I discussed / reviewed the pharmacy note by Dr. Kara Mead and I agree with the resident's findings and plans as documented.  Vaughan Basta BS, PharmD, BCPS Clinical Pharmacist 01/20/2022 8:16 AM  Contact: [phone] after 3 PM  "Be curious, not judgmental..." -Jamal Maes --------------------------------------------------------------------------------------------------------------------

## 2022-01-20 NOTE — Progress Notes (Signed)
PROGRESS NOTE    Cameron Potts  YQM:[id number] DOB: [birth year] DOA: 01/19/2022 PCP: [name], MD   Brief Narrative:  Cameron Potts is a 86 y.o. male with medical history significant of atrial fibrillation on coumadin, HLD, hx of seizures, PD, BPH and hyponatremia presenting to ED with complaints of abnormal lab. He had labs checked at his Pcp and sodium was trending downwards. He had labs done yesterday and sodium was 125 so advised to come to Ed. He was seen in ED a few days ago for a fall, but states he falls from time to time with his PD. He states he hasn't felt as good as he normally does since early today. He denies feeling more tired than normal. He denies any confusion.   Assessment & Plan:   Principal Problem:   Hyponatremia Active Problems:   Cellulitis of left lower leg   Thyroid nodule   Seizure disorder (HCC)   Chronic atrial fibrillation   Parkinsonian syndrome associated with symptomatic orthostatic hypotension (HCC)   Benign prostatic hyperplasia with urinary frequency   Hyponatremia, acute on chronic, POA Long history of hyponatremia dating back to 2012. Average low 130s - acutely drops during infection (last pneumonia - as low as 124) Taking 1g salt tablets BID chronically --> increase to 2g TID and follow clinically Denies any excess free water intake or exogenous losses, appears euvolemic Recent down trending from 129>125>123>122 Questionably noncompliant with home medications/diet Keppra possibly contributing Fluid restriction 1500cc Follow q12h BMP   Cellulitis of left lower leg, acute He has no leukocytosis, elevated HR/RR Mild disease, continue keflex.  Received 2g rocephin in ED  Possibly contributing to above hyponatremia given history of profound hyponatremia with infection   Thyroid nodule Incidental finding on CT chest. 2.2x1.6cm Thyroid US - follow up outpetient   Seizure disorder ([place]) Seizure precautions  Continue  AED: vimpat, keppra, dilantin    Chronic atrial fibrillation Continue flecainide and warfarin Pharmacy to dose coumadin - follow INR    Parkinsonian syndrome associated with symptomatic orthostatic hypotension (HCC) Hx of PD and orthostasis with falls Fall precautions     Benign prostatic hyperplasia with urinary frequency Continue proscar and flomax    DVT prophylaxis: Warfarin Code Status: DNR Family Communication: Patient updated  Status is: Inpatient  Dispo: The patient is from: Home              Anticipated d/c is to: TBD              Anticipated d/c date is: TBD; >72h              Patient currently NOT medically stable for discharge  Consultants:  None  Procedures:  None  Antimicrobials:  Keflex   Subjective: No acute issues/events overnight  Objective: Vitals:   01/19/22 1700 01/19/22 2002 01/20/22 0000 01/20/22 0400  BP: (!) 175/53 (!) 160/72 (!) 143/65 (!) 160/64  Pulse: 60 60 60 60  Resp: '17 16 17 19  '$ Temp: (!) 97.4 F (36.3 C) 98.4 F (36.9 C) 98.2 F (36.8 C) (!) 97.4 F (36.3 C)  TempSrc: Oral Oral Oral Oral  SpO2:  96% 95% 94%    Intake/Output Summary (Last 24 hours) at 01/20/2022 0753 Last data filed at 01/20/2022 0547 Gross per 24 hour  Intake 360 ml  Output 650 ml  Net -290 ml   There were no vitals filed for this visit.  Examination:  General exam: Appears calm and comfortable  Respiratory  system: Clear to auscultation. Respiratory effort normal. Cardiovascular system: S1 & S2 heard, RRR. No JVD, murmurs, rubs, gallops or clicks. No pedal edema. Gastrointestinal system: Abdomen is nondistended, soft and nontender. No organomegaly or masses felt. Normal bowel sounds heard. Central nervous system: Alert and oriented. No focal neurological deficits. Extremities: Symmetric 5 x 5 power. Skin: No rashes, lesions or ulcers Psychiatry: Judgement and insight appear normal. Mood & affect appropriate.     Data Reviewed: I have personally  reviewed following labs and imaging studies  CBC: Recent Labs  Lab 01/14/22 0953 01/19/22 1139 01/19/22 1143 01/20/22 0250  WBC 6.6 5.3  --  4.1  NEUTROABS  --  2.4  --   --   HGB 12.1* 11.6* 11.2* 11.7*  HCT 34.4* 32.2* 33.0* 31.7*  MCV 93.0 90.4  --  91.9  PLT 202 234  --  481   Basic Metabolic Panel: Recent Labs  Lab 01/14/22 0953 01/19/22 1139 01/19/22 1143 01/19/22 1658 01/19/22 2206 01/20/22 0250  NA 129* 123* 123* 123* 123* 122*  K 4.4 4.4 4.7 3.8 4.5 4.1  CL 99 95* 92* 91* 96* 92*  CO2 22 22  --  21* 20* 20*  GLUCOSE 108* 104* 101* 131* 100* 97  BUN '10 9 10 '$ 7* 8 7*  CREATININE 0.81 0.65 0.50* 0.62 0.69 0.60*  CALCIUM 8.6* 8.4*  --  8.5* 8.1* 8.3*   GFR: Estimated Creatinine Clearance: 65.8 mL/min (A) (by C-G formula based on SCr of 0.6 mg/dL (L)). Liver Function Tests: No results for input(s): "AST", "ALT", "ALKPHOS", "BILITOT", "PROT", "ALBUMIN" in the last 168 hours. No results for input(s): "LIPASE", "AMYLASE" in the last 168 hours. No results for input(s): "AMMONIA" in the last 168 hours. Coagulation Profile: Recent Labs  Lab 01/14/22 0953 01/19/22 1658 01/20/22 0250  INR 2.7* 2.7* 3.1*   Cardiac Enzymes: No results for input(s): "CKTOTAL", "CKMB", "CKMBINDEX", "TROPONINI" in the last 168 hours. BNP (last 3 results) No results for input(s): "PROBNP" in the last 8760 hours. HbA1C: No results for input(s): "HGBA1C" in the last 72 hours. CBG: Recent Labs  Lab 01/14/22 0948  GLUCAP 116*   Lipid Profile: No results for input(s): "CHOL", "HDL", "LDLCALC", "TRIG", "CHOLHDL", "LDLDIRECT" in the last 72 hours. Thyroid Function Tests: Recent Labs    01/19/22 1658  TSH 2.685   Anemia Panel: No results for input(s): "VITAMINB12", "FOLATE", "FERRITIN", "TIBC", "IRON", "RETICCTPCT" in the last 72 hours. Sepsis Labs: No results for input(s): "PROCALCITON", "LATICACIDVEN" in the last 168 hours.  No results found for this or any previous visit (from  the past 240 hour(s)).       Radiology Studies: No results found.      Scheduled Meds:  ALPRAZolam  0.25 mg Oral BID   brimonidine  1 drop Both Eyes BID   cephALEXin  500 mg Oral TID   finasteride  5 mg Oral q morning   flecainide  50 mg Oral BID   lacosamide  200 mg Oral BID   latanoprost  1 drop Both Eyes QHS   levETIRAcetam  2,250 mg Oral Daily   phenytoin  75 mg Oral Daily   phenytoin  300 mg Oral QHS   sodium chloride flush  3 mL Intravenous Q12H   sodium chloride  2 g Oral TID WC   tamsulosin  0.4 mg Oral Daily   timolol  1 drop Both Eyes BID   warfarin  2.5 mg Oral Once per day on Sun Mon Tue Thu Fri  Sat   And   [START ON 01/23/2022] warfarin  2 mg Oral Once per day on Wed   Warfarin - Pharmacist Dosing Inpatient   Does not apply q1600   Continuous Infusions:  sodium chloride       LOS: 0 days   Time spent: 56mn  Ladarryl Wrage C Elmus Mathes, DO [facility]  If 7PM-7AM, please contact night-coverage [email or web address]  01/20/2022, 7:53 AM

## 2022-01-21 DIAGNOSIS — E871 Hypo-osmolality and hyponatremia: Secondary | ICD-10-CM | POA: Diagnosis not present

## 2022-01-21 LAB — CBC
HCT: 31.5 % — ABNORMAL LOW (ref 39.0–52.0)
Hemoglobin: 11.6 g/dL — ABNORMAL LOW (ref 13.0–17.0)
MCH: 32.6 pg (ref 26.0–34.0)
MCHC: 36.8 g/dL — ABNORMAL HIGH (ref 30.0–36.0)
MCV: 88.5 fL (ref 80.0–100.0)
Platelets: 236 10*3/uL (ref 150–400)
RBC: 3.56 MIL/uL — ABNORMAL LOW (ref 4.22–5.81)
RDW: 12.1 % (ref 11.5–15.5)
WBC: 4.3 10*3/uL (ref 4.0–10.5)
nRBC: 0 % (ref 0.0–0.2)

## 2022-01-21 LAB — PROTIME-INR
INR: >10 (ref 0.8–1.2)
INR: 2.7 — ABNORMAL HIGH (ref 0.8–1.2)
Prothrombin Time: >90 seconds — ABNORMAL HIGH (ref 11.4–15.2)
Prothrombin Time: 28.3 seconds — ABNORMAL HIGH (ref 11.4–15.2)

## 2022-01-21 LAB — BASIC METABOLIC PANEL
Anion gap: 10 (ref 5–15)
BUN: 7 mg/dL — ABNORMAL LOW (ref 8–23)
CO2: 19 mmol/L — ABNORMAL LOW (ref 22–32)
Calcium: 8.7 mg/dL — ABNORMAL LOW (ref 8.9–10.3)
Chloride: 94 mmol/L — ABNORMAL LOW (ref 98–111)
Creatinine, Ser: 0.62 mg/dL (ref 0.61–1.24)
GFR, Estimated: >60 mL/min (ref >60)
Glucose, Bld: 91 mg/dL (ref 70–99)
Potassium: 4.1 mmol/L (ref 3.5–5.1)
Sodium: 123 mmol/L — ABNORMAL LOW (ref 135–145)

## 2022-01-21 MED ORDER — WARFARIN SODIUM 2 MG PO TABS
2.0000 mg | ORAL_TABLET | ORAL | Status: DC
Start: 2022-01-23 — End: 2022-01-27
  Administered 2022-01-23: 2 mg via ORAL
  Filled 2022-01-21: qty 1

## 2022-01-21 MED ORDER — WARFARIN SODIUM 2.5 MG PO TABS
2.5000 mg | ORAL_TABLET | ORAL | Status: DC
  Administered 2022-01-21 – 2022-01-26 (×5): 2.5 mg via ORAL
  Filled 2022-01-21 (×5): qty 1

## 2022-01-21 NOTE — Progress Notes (Signed)
  Transition of Care [facility]) Screening Note   Patient Details  Name: Tramayne Sebesta III Date of Birth: 1935/08/29   Transition of Care [facility]) CM/SW Contact:    Cyndi Bender, RN Phone Number: 01/21/2022, 9:14 AM    Transition of Care Department [facility]) has reviewed patient and no TOC needs have been identified at this time. We will continue to monitor patient advancement through interdisciplinary progression rounds. If new patient transition needs arise, please place a TOC consult.

## 2022-01-21 NOTE — Progress Notes (Signed)
CSW left voicemail for Butch Penny with [facility] to confirm services that their ALF provides.   Gilmore Laroche, MSW, [facility]

## 2022-01-21 NOTE — Progress Notes (Signed)
ANTICOAGULATION CONSULT NOTE - Follow up  Pharmacy Consult for Warfarin Indication: atrial fibrillation  Allergies  Allergen Reactions   Depakote [Divalproex Sodium] Other (See Comments)    Arthralgias     Vital Signs: BP: 160/62 (08/07 0604) Pulse Rate: 60 (08/07 0604)  Labs: Recent Labs    01/19/22 1139 01/19/22 1143 01/19/22 1658 01/20/22 0250 01/20/22 1456 01/21/22 0844 01/21/22 1230  HGB 11.6* 11.2*  --  11.7*  --  11.6*  --   HCT 32.2* 33.0*  --  31.7*  --  31.5*  --   PLT 234  --   --  213  --  236  --   LABPROT  --   --    < > 31.3*  --  >90.0* 28.3*  INR  --   --    < > 3.1*  --  >10.0* 2.7*  CREATININE 0.65 0.50*   < > 0.60* 0.74 0.62  --    < > = values in this interval not displayed.     Estimated Creatinine Clearance: 65.8 mL/min (by C-G formula based on SCr of 0.62 mg/dL).   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[place])    Urinary urgency     Medications:  Scheduled:   ALPRAZolam  0.25 mg Oral BID   brimonidine  1 drop Both Eyes BID   cephALEXin  500 mg Oral TID   finasteride  5 mg Oral q morning   flecainide  50 mg Oral BID   lacosamide  200 mg Oral BID   latanoprost  1 drop Both Eyes QHS   levETIRAcetam  2,250 mg Oral Daily   phenytoin  75 mg Oral Daily   phenytoin  300 mg Oral QHS   sodium chloride flush  3 mL Intravenous Q12H   sodium chloride  2 g Oral TID WC   tamsulosin  0.4 mg Oral Daily   timolol  1 drop Both Eyes BID   Warfarin - Pharmacist Dosing Inpatient   Does not apply q1600     Assessment: 86 yo male with chronic afib on PTA warfarin, HLD, hx of seizures, parkinson's disease, and BPH admitted for hyponatremia and cellulitis of left leg. Pharmacy consulted to dose warfarin.  Patient PTA warfarin regimen: 2.5 mg daily, except Wednesdays 2 mg.   8/7- AM labs: INR today supratherapeutic at  >10. I have ordered a repeat INR and CBC to verify prior to placing new orders for coumadin  8/7 repeat INR: 2.7  Goal of Therapy:  INR 2-3 Monitor platelets by anticoagulation protocol: Yes   Plan:  Continue with home dose of coumadin (2.5 mg all days except Wednesday (2 mg)) Check daily INR, CBC, and monitor for s/sx of bleeding   Vaughan Basta BS, PharmD, BCPS Clinical Pharmacist 01/21/2022 2:07 PM  Contact: [phone] after 3 PM  "Be curious, not judgmental..." -Jamal Maes --------------------------------------------------------------------------------------------------------------------

## 2022-01-21 NOTE — Progress Notes (Signed)
Tele monitor room called to notify that patient is now in a different rhythm change than before. Notified Jennette Kettle, MD and orders were to obtain EKG to confirm rhythm change and patient refused and stated "put it in my file that I refused." Notified MD about patient's refusal. No new orders at this time.

## 2022-01-21 NOTE — Progress Notes (Signed)
TRH night coverage note:  Pt went from narrow complex earlier to LBBB.  Pt asymptomatic and wont let us get an EKG currently.  On my review of chart, it looks like patient gets intermittent LBBB on prior EKGs.  For example on 8/1 and 7/31 he had LBBB on his EKGs.  This morning he is back to narrow complex, still asymptomatic.

## 2022-01-21 NOTE — Evaluation (Addendum)
Physical Therapy Evaluation Patient Details Name: Cameron Potts MRN: [id number] DOB: March 11, 1936 Today's Date: 01/21/2022  History of Present Illness  86 y.o. male presenting to ED 01/19/22 with complaints of abnormal lab. +hyponatremia; cellulitis LLE; PMH significant of atrial fibrillation on coumadin, HLD, hx of seizures, PD, BPH and hyponatremia  Clinical Impression   Pt admitted secondary to problem above with deficits below. PTA patient was living at ALF and was mobilizing modified independent with RW.  Pt currently requires minguard assist for standing and transfer to the chair. Ambulation deferred due to drop in SBP.  Anticipate patient will benefit from PT to address problems listed below.Will continue to follow acutely to maximize functional mobility independence and safety.      Supine BP 157/71 HR 60 Sitting BP 164/80      67 Standing BP 128/63   63  *pt asymptomatic Sitting BP 145/77     62     Recommendations for follow up therapy are one component of a multi-disciplinary discharge planning process, led by the attending physician.  Recommendations may be updated based on patient status, additional functional criteria and insurance authorization.  Follow Up Recommendations Home health PT      Assistance Recommended at Discharge Intermittent Supervision/Assistance  Patient can return home with the following  A little help with walking and/or transfers;A little help with bathing/dressing/bathroom;Assistance with cooking/housework;Direct supervision/assist for medications management;Direct supervision/assist for financial management;Assist for transportation;Help with stairs or ramp for entrance    Equipment Recommendations None recommended by PT  Recommendations for Other Services       Functional Status Assessment Patient has had a recent decline in their functional status and demonstrates the ability to make significant improvements in function in a reasonable and  predictable amount of time.     Precautions / Restrictions Precautions Precautions: Fall Precaution Comments: h/o orthostasis with falls      Mobility  Bed Mobility Overal bed mobility: Modified Independent             General bed mobility comments: incr time to get out to EOB with feet on the floor    Transfers Overall transfer level: Needs assistance Equipment used: Rolling walker (2 wheels) Transfers: Sit to/from Stand Sit to Stand: Min guard           General transfer comment: guarding for safety due to decr BP    Ambulation/Gait               General Gait Details: deferred due to nearly 40 mmHg drop in SBP  Stairs            Wheelchair Mobility    Modified Rankin (Stroke Patients Only)       Balance Overall balance assessment: History of Falls                                           Pertinent Vitals/Pain Pain Assessment Pain Assessment: No/denies pain    Home Living Family/patient expects to be discharged to:: Assisted living                 Home Equipment: Conservation officer, nature (2 wheels)      Prior Function Prior Level of Function : Needs assist             Mobility Comments: modified independent with RW ADLs Comments: assist with meds and transportation  Hand Dominance        Extremity/Trunk Assessment   Upper Extremity Assessment Upper Extremity Assessment: Defer to OT evaluation    Lower Extremity Assessment Lower Extremity Assessment: Overall WFL for tasks assessed    Cervical / Trunk Assessment Cervical / Trunk Assessment: Other exceptions Cervical / Trunk Exceptions: limited mobility/stiffness from PD  Communication   Communication: No difficulties  Cognition Arousal/Alertness: Awake/alert Behavior During Therapy: Flat affect Overall Cognitive Status: Within Functional Limits for tasks assessed                                          General Comments       Exercises     Assessment/Plan    PT Assessment Patient needs continued PT services  PT Problem List Decreased activity tolerance;Decreased balance;Decreased mobility;Cardiopulmonary status limiting activity       PT Treatment Interventions DME instruction;Gait training;Functional mobility training;Therapeutic activities;Therapeutic exercise;Balance training;Patient/family education    PT Goals (Current goals can be found in the Care Plan section)  Acute Rehab PT Goals Patient Stated Goal: return to ALF PT Goal Formulation: With patient Time For Goal Achievement: 02/04/22 Potential to Achieve Goals: Good    Frequency Min 3X/week     Co-evaluation               AM-PAC PT "6 Clicks" Mobility  Outcome Measure Help needed turning from your back to your side while in a flat bed without using bedrails?: None Help needed moving from lying on your back to sitting on the side of a flat bed without using bedrails?: None Help needed moving to and from a bed to a chair (including a wheelchair)?: A Little Help needed standing up from a chair using your arms (e.g., wheelchair or bedside chair)?: A Little Help needed to walk in hospital room?: Total Help needed climbing 3-5 steps with a railing? : Total 6 Click Score: 16    End of Session Equipment Utilized During Treatment: Gait belt Activity Tolerance: Treatment limited secondary to medical complications (Comment) (BP drop) Patient left: in chair;with call bell/phone within reach;with chair alarm set Nurse Communication: Mobility status;Other (comment) (drop in BP; asymptomatic) PT Visit Diagnosis: Other abnormalities of gait and mobility (R26.89);Other symptoms and signs involving the nervous system (R29.898)    Time: 7672-0947 PT Time Calculation (min) (ACUTE ONLY): 30 min   Charges:   PT Evaluation $PT Eval Low Complexity: 1 Low PT Treatments $Therapeutic Activity: 8-22 mins         Arby Barrette, PT Acute  Rehabilitation Services  Office [phone]   Rexanne Mano 01/21/2022, 3:40 PM

## 2022-01-21 NOTE — Progress Notes (Signed)
ANTICOAGULATION CONSULT NOTE - Follow up  Pharmacy Consult for Warfarin Indication: atrial fibrillation  Allergies  Allergen Reactions   Depakote [Divalproex Sodium] Other (See Comments)    Arthralgias     Vital Signs: Temp: 98.2 F (36.8 C) (08/07 0100) Temp Source: Oral (08/07 0100) BP: 160/62 (08/07 0604) Pulse Rate: 60 (08/07 0604)  Labs: Recent Labs    01/19/22 1139 01/19/22 1143 01/19/22 1658 01/19/22 2206 01/20/22 0250 01/20/22 1456 01/21/22 0844  HGB 11.6* 11.2*  --   --  11.7*  --   --   HCT 32.2* 33.0*  --   --  31.7*  --   --   PLT 234  --   --   --  213  --   --   LABPROT  --   --  28.6*  --  31.3*  --  >90.0*  INR  --   --  2.7*  --  3.1*  --  >10.0*  CREATININE 0.65 0.50* 0.62   < > 0.60* 0.74 0.62   < > = values in this interval not displayed.     Estimated Creatinine Clearance: 65.8 mL/min (by C-G formula based on SCr of 0.62 mg/dL).   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[place])    Urinary urgency     Medications:  Scheduled:   ALPRAZolam  0.25 mg Oral BID   brimonidine  1 drop Both Eyes BID   cephALEXin  500 mg Oral TID   finasteride  5 mg Oral q morning   flecainide  50 mg Oral BID   lacosamide  200 mg Oral BID   latanoprost  1 drop Both Eyes QHS   levETIRAcetam  2,250 mg Oral Daily   phenytoin  75 mg Oral Daily   phenytoin  300 mg Oral QHS   sodium chloride flush  3 mL Intravenous Q12H   sodium chloride  2 g Oral TID WC   tamsulosin  0.4 mg Oral Daily   timolol  1 drop Both Eyes  BID   Warfarin - Pharmacist Dosing Inpatient   Does not apply q1600    Assessment: 86 yo male with chronic afib on PTA warfarin, HLD, hx of seizures, parkinson's disease, and BPH admitted for hyponatremia and cellulitis of left leg. Pharmacy consulted to dose warfarin.  Patient PTA warfarin regimen: 2.5 mg daily, except Wednesdays 2 mg.   INR today supratherapeutic at  >10. I have ordered a repeat INR and CBC to verify prior to placing new orders for coumadin  Goal of Therapy:  INR 2-3 Monitor platelets by anticoagulation protocol: Yes   Plan:  Hold coumadin until INR has been re-checked and then re-assess Check daily INR, CBC, and monitor for s/sx of bleeding   Vaughan Basta BS, PharmD, BCPS Clinical Pharmacist 01/21/2022 10:54 AM  Contact: [phone] after 3 PM  "Be curious, not judgmental..." -Jamal Maes --------------------------------------------------------------------------------------------------------------------

## 2022-01-21 NOTE — Progress Notes (Addendum)
PROGRESS NOTE    Cameron Potts  GMW:[id number] DOB: Mar 31, 1936 DOA: 01/19/2022 PCP: [name], MD   Brief Narrative:  Cameron Potts is a 86 y.o. male with medical history significant of atrial fibrillation on coumadin, HLD, hx of seizures, PD, BPH and hyponatremia presenting to ED with complaints of abnormal lab. He had labs checked at his Pcp and sodium was trending downwards. He had labs done yesterday and sodium was 125 so advised to come to Ed. He was seen in ED a few days ago for a fall, but states he falls from time to time with his PD. He states he hasn't felt as good as he normally does since early today. He denies feeling more tired than normal. He denies any confusion.   Assessment & Plan:   Principal Problem:   Hyponatremia Active Problems:   Cellulitis of left lower leg   Thyroid nodule   Seizure disorder (HCC)   Chronic atrial fibrillation   Parkinsonian syndrome associated with symptomatic orthostatic hypotension (HCC)   Benign prostatic hyperplasia with urinary frequency   Acute hyponatremia   Hyponatremia, acute on chronic, POA Long history of hyponatremia dating back to 2012. Average low 130s - acutely drops during infection (last pneumonia - as low as 124) Taking 1g salt tablets BID chronically --> increase to 2g TID and follow clinically Denies any excess free water intake or exogenous losses, appears euvolemic Stabilizing 129>125>123>122>123** Questionably noncompliant with home medications/diet although at assisted living so this is less likely Keppra possibly contributing Fluid restriction 1500cc Follow q12h BMP   Cellulitis of left lower leg, acute He has no leukocytosis, elevated HR/RR Mild disease, continue keflex.  Received 2g rocephin in ED  Possibly contributing to above hyponatremia given history of profound hyponatremia with infection   Thyroid nodule Incidental finding on CT chest. 2.2x1.6cm Thyroid US - follow up  outpetient   Seizure disorder (HCC) Seizure precautions  Continue AED: vimpat, keppra, dilantin    Chronic atrial fibrillation Continue flecainide and warfarin Pharmacy to dose coumadin - follow INR  **INR today >10 - likely lab error given previously stable on current dose - will follow repeat labs to ensure accuracy - vitK/reversal pending repeat labs**   Parkinsonian syndrome associated with symptomatic orthostatic hypotension (HCC) Hx of PD and orthostasis with falls Fall precautions   Benign prostatic hyperplasia with urinary frequency Continue proscar and flomax    DVT prophylaxis: Warfarin Code Status: DNR Family Communication: Grandson at bedside  Status is: Inpatient  Dispo: The patient is from: Home              Anticipated d/c is to: TBD              Anticipated d/c date is: TBD; >72h              Patient currently NOT medically stable for discharge  Consultants:  None  Procedures:  None  Antimicrobials:  Keflex   Subjective: No acute issues/events overnight, mental status minimally improving but not yet back to baseline  Objective: Vitals:   01/20/22 1600 01/20/22 1910 01/21/22 0100 01/21/22 0604  BP: 129/63 138/68 132/65 (!) 160/62  Pulse: 62 61 60 60  Resp:  '18 18 18  '$ Temp:  98.1 F (36.7 C) 98.2 F (36.8 C)   TempSrc:  Oral Oral   SpO2: 95% 94% 94% 94%   No intake or output data in the 24 hours ending 01/21/22 0741  There were no vitals filed  for this visit.  Examination:  General exam: Appears calm and comfortable  Respiratory system: Clear to auscultation. Respiratory effort normal. Cardiovascular system: S1 & S2 heard, RRR. No JVD, murmurs, rubs, gallops or clicks. No pedal edema. Gastrointestinal system: Abdomen is nondistended, soft and nontender. No organomegaly or masses felt. Normal bowel sounds heard. Central nervous system: Alert and oriented. No focal neurological deficits. Extremities: Symmetric 5 x 5 power. Skin: Blanching  erythema LLE anteriorly - improving  Data Reviewed: I have personally reviewed following labs and imaging studies  CBC: Recent Labs  Lab 01/14/22 0953 01/19/22 1139 01/19/22 1143 01/20/22 0250  WBC 6.6 5.3  --  4.1  NEUTROABS  --  2.4  --   --   HGB 12.1* 11.6* 11.2* 11.7*  HCT 34.4* 32.2* 33.0* 31.7*  MCV 93.0 90.4  --  91.9  PLT 202 234  --  592    Basic Metabolic Panel: Recent Labs  Lab 01/19/22 1139 01/19/22 1143 01/19/22 1658 01/19/22 2206 01/20/22 0250 01/20/22 1456  NA 123* 123* 123* 123* 122* 123*  K 4.4 4.7 3.8 4.5 4.1 4.2  CL 95* 92* 91* 96* 92* 94*  CO2 22  --  21* 20* 20* 21*  GLUCOSE 104* 101* 131* 100* 97 123*  BUN 9 10 7* 8 7* 10  CREATININE 0.65 0.50* 0.62 0.69 0.60* 0.74  CALCIUM 8.4*  --  8.5* 8.1* 8.3* 8.4*    GFR: Estimated Creatinine Clearance: 65.8 mL/min (by C-G formula based on SCr of 0.74 mg/dL). Liver Function Tests: No results for input(s): "AST", "ALT", "ALKPHOS", "BILITOT", "PROT", "ALBUMIN" in the last 168 hours. No results for input(s): "LIPASE", "AMYLASE" in the last 168 hours. No results for input(s): "AMMONIA" in the last 168 hours. Coagulation Profile: Recent Labs  Lab 01/14/22 0953 01/19/22 1658 01/20/22 0250  INR 2.7* 2.7* 3.1*    Cardiac Enzymes: No results for input(s): "CKTOTAL", "CKMB", "CKMBINDEX", "TROPONINI" in the last 168 hours. BNP (last 3 results) No results for input(s): "PROBNP" in the last 8760 hours. HbA1C: No results for input(s): "HGBA1C" in the last 72 hours. CBG: Recent Labs  Lab 01/14/22 0948  GLUCAP 116*    Lipid Profile: No results for input(s): "CHOL", "HDL", "LDLCALC", "TRIG", "CHOLHDL", "LDLDIRECT" in the last 72 hours. Thyroid Function Tests: Recent Labs    01/19/22 1658  TSH 2.685    Anemia Panel: No results for input(s): "VITAMINB12", "FOLATE", "FERRITIN", "TIBC", "IRON", "RETICCTPCT" in the last 72 hours. Sepsis Labs: No results for input(s): "PROCALCITON", "LATICACIDVEN" in  the last 168 hours.  No results found for this or any previous visit (from the past 240 hour(s)).       Radiology Studies: No results found.      Scheduled Meds:  ALPRAZolam  0.25 mg Oral BID   brimonidine  1 drop Both Eyes BID   cephALEXin  500 mg Oral TID   finasteride  5 mg Oral q morning   flecainide  50 mg Oral BID   lacosamide  200 mg Oral BID   latanoprost  1 drop Both Eyes QHS   levETIRAcetam  2,250 mg Oral Daily   phenytoin  75 mg Oral Daily   phenytoin  300 mg Oral QHS   sodium chloride flush  3 mL Intravenous Q12H   sodium chloride  2 g Oral TID WC   tamsulosin  0.4 mg Oral Daily   timolol  1 drop Both Eyes BID   warfarin  2.5 mg Oral Once per day on  Nancy Fetter YYQ Tue Thu Fri Sat   And   [START ON 01/23/2022] warfarin  2 mg Oral Once per day on Wed   Warfarin - Pharmacist Dosing Inpatient   Does not apply q1600   Continuous Infusions:  sodium chloride       LOS: 1 day   Time spent: 89mn  Saul Dorsi C Bart Ashford, DO [facility]  If 7PM-7AM, please contact night-coverage [email or web address]  01/21/2022, 7:41 AM

## 2022-01-22 DIAGNOSIS — E871 Hypo-osmolality and hyponatremia: Secondary | ICD-10-CM | POA: Diagnosis not present

## 2022-01-22 LAB — BASIC METABOLIC PANEL
Anion gap: 8 (ref 5–15)
BUN: 9 mg/dL (ref 8–23)
CO2: 20 mmol/L — ABNORMAL LOW (ref 22–32)
Calcium: 8.2 mg/dL — ABNORMAL LOW (ref 8.9–10.3)
Chloride: 94 mmol/L — ABNORMAL LOW (ref 98–111)
Creatinine, Ser: 0.61 mg/dL (ref 0.61–1.24)
GFR, Estimated: >60 mL/min (ref >60)
Glucose, Bld: 90 mg/dL (ref 70–99)
Potassium: 4.4 mmol/L (ref 3.5–5.1)
Sodium: 122 mmol/L — ABNORMAL LOW (ref 135–145)

## 2022-01-22 LAB — PROTIME-INR
INR: 2.4 — ABNORMAL HIGH (ref 0.8–1.2)
Prothrombin Time: 26.2 seconds — ABNORMAL HIGH (ref 11.4–15.2)

## 2022-01-22 MED ORDER — POLYETHYLENE GLYCOL 3350 17 G PO PACK
17.0000 g | PACK | Freq: Every day | ORAL | Status: DC
  Administered 2022-01-22 – 2022-01-27 (×6): 17 g via ORAL
  Filled 2022-01-22 (×6): qty 1

## 2022-01-22 MED ORDER — SODIUM CHLORIDE 0.9 % IV BOLUS
500.0000 mL | Freq: Once | INTRAVENOUS | Status: AC
Start: 2022-01-22 — End: 2022-01-22
  Administered 2022-01-22: 500 mL via INTRAVENOUS

## 2022-01-22 MED ORDER — SENNOSIDES-DOCUSATE SODIUM 8.6-50 MG PO TABS
1.0000 | ORAL_TABLET | Freq: Every evening | ORAL | Status: DC | PRN
Start: 2022-01-22 — End: 2022-01-27

## 2022-01-22 NOTE — Evaluation (Signed)
Occupational Therapy Evaluation Patient Details Name: Cameron Potts MRN: [id number] DOB: 08-10-1935 Today's Date: 01/22/2022   History of Present Illness 86 y.o. male presenting to ED 01/19/22 with complaints of abnormal lab. +hyponatremia; cellulitis LLE; PMH significant of atrial fibrillation on coumadin, HLD, hx of seizures, PD, BPH and hyponatremia   Clinical Impression   Pt in session required min guard to min assist with sit to stand transfer as started to have a posterior lean when standing and min guard for ambulation. Pt required min assist following BM, set up for Ue dressing and min to moderate assist with LB ADLs. Pt reported they have orthostatic hypotension and had a fall last week. Pt currently with functional limitations due to the deficits listed below (see OT Problem List).  Pt will benefit from skilled OT to increase their safety and independence with ADL and functional mobility for ADL to facilitate discharge to venue listed below.     BP in supine:152/75 Sitting: 149/61 Standing: 123/79   Recommendations for follow up therapy are one component of a multi-disciplinary discharge planning process, led by the attending physician.  Recommendations may be updated based on patient status, additional functional criteria and insurance authorization.   Follow Up Recommendations  Home health OT    Assistance Recommended at Discharge Frequent or constant Supervision/Assistance  Patient can return home with the following A little help with walking and/or transfers;A little help with bathing/dressing/bathroom;Assistance with cooking/housework;Direct supervision/assist for medications management;Direct supervision/assist for financial management;Assist for transportation (Pt may need an increase in level of care at ALF due to increase need for min guard with ambulation and an increase in assist for ADLs as reported that they only had stand by assist for showers at baseline)     Functional Status Assessment  Patient has had a recent decline in their functional status and demonstrates the ability to make significant improvements in function in a reasonable and predictable amount of time.  Equipment Recommendations  BSC/3in1;Tub/shower seat    Recommendations for Other Services       Precautions / Restrictions Precautions Precautions: Fall Precaution Comments: h/o orthostasis with falls Restrictions Weight Bearing Restrictions: No      Mobility Bed Mobility Overal bed mobility: Needs Assistance Bed Mobility: Supine to Sit     Supine to sit: Min assist          Transfers Overall transfer level: Needs assistance Equipment used: Rolling walker (2 wheels) Transfers: Sit to/from Stand Sit to Stand: Min assist           General transfer comment: Pt has a posterior tilt and needs increase time in stadning to get balance, pt reports they do not feel when BP is lowering and will just fall      Balance Overall balance assessment: History of Falls                                         ADL either performed or assessed with clinical judgement   ADL Overall ADL's : Needs assistance/impaired Eating/Feeding: Set up;Sitting   Grooming: Wash/dry hands;Wash/dry face;Oral care;Min guard;Cueing for safety;Cueing for sequencing;Sitting   Upper Body Bathing: Set up;Sitting   Lower Body Bathing: Minimal assistance;Moderate assistance;Sit to/from stand   Upper Body Dressing : Set up;Sitting   Lower Body Dressing: Minimal assistance;Moderate assistance;Cueing for safety;Cueing for sequencing;Sit to/from stand   Toilet Transfer: Min guard;Cueing for safety;Cueing for sequencing;Rolling  walker (2 wheels)   Toileting- Clothing Manipulation and Hygiene: Minimal assistance;Cueing for safety;Cueing for sequencing;Sit to/from stand       Functional mobility during ADLs: Min guard;Rolling walker (2 wheels)       Vision          Perception     Praxis      Pertinent Vitals/Pain Pain Assessment Pain Assessment: No/denies pain     Hand Dominance Right   Extremity/Trunk Assessment Upper Extremity Assessment Upper Extremity Assessment: Generalized weakness (Pt reported that they feel sore from exercises they were doing at ALF)   Lower Extremity Assessment Lower Extremity Assessment: Defer to PT evaluation       Communication Communication Communication: No difficulties   Cognition Arousal/Alertness: Awake/alert Behavior During Therapy: Flat affect Overall Cognitive Status: Difficult to assess                                 General Comments: slow to respond     General Comments       Exercises     Shoulder Instructions      Home Living Family/patient expects to be discharged to:: Assisted living                             Home Equipment: Rollator (4 wheels)   Additional Comments: from [facility] but only having someone near by for showers      Prior Functioning/Environment Prior Level of Function : Needs assist             Mobility Comments: modified independent with RW ADLs Comments: assist with meds and transportation        OT Problem List: Decreased activity tolerance;Impaired balance (sitting and/or standing);Decreased cognition;Decreased safety awareness;Decreased knowledge of use of DME or AE;Cardiopulmonary status limiting activity;Pain      OT Treatment/Interventions: Self-care/ADL training;DME and/or AE instruction;Therapeutic activities;Patient/family education;Balance training    OT Goals(Current goals can be found in the care plan section) Acute Rehab OT Goals Patient Stated Goal: to get moving OT Goal Formulation: With patient Time For Goal Achievement: 02/05/22 Potential to Achieve Goals: Good ADL Goals Pt Will Perform Upper Body Dressing: Independently;sitting Pt Will Perform Lower Body Dressing: with modified  independence;sit to/from stand Pt Will Transfer to Toilet: with modified independence;ambulating;grab bars Pt Will Perform Tub/Shower Transfer: with modified independence;shower seat;ambulating;grab bars;rolling walker  OT Frequency: Min 2X/week    Co-evaluation              AM-PAC OT "6 Clicks" Daily Activity     Outcome Measure Help from another person eating meals?: None Help from another person taking care of personal grooming?: None Help from another person toileting, which includes using toliet, bedpan, or urinal?: A Little Help from another person bathing (including washing, rinsing, drying)?: A Lot Help from another person to put on and taking off regular upper body clothing?: A Little Help from another person to put on and taking off regular lower body clothing?: A Lot 6 Click Score: 18   End of Session Equipment Utilized During Treatment: Gait belt;Rolling walker (2 wheels) Nurse Communication: Mobility status  Activity Tolerance: Patient tolerated treatment well Patient left: in chair;with call bell/phone within reach;with chair alarm set  OT Visit Diagnosis: Unsteadiness on feet (R26.81);Other abnormalities of gait and mobility (R26.89);Repeated falls (R29.6);Muscle weakness (generalized) (M62.81);History of falling (Z91.81)  Time: 0813-0909 OT Time Calculation (min): 56 min Charges:  OT General Charges $OT Visit: 1 Visit OT Evaluation $OT Eval Low Complexity: 1 Low OT Treatments $Self Care/Home Management : 38-52 mins  Joeseph Amor OTR/L  Acute Rehab Services  [phone] office number [phone] pager number   Joeseph Amor 01/22/2022, 11:55 AM

## 2022-01-22 NOTE — Progress Notes (Signed)
Physical Therapy Treatment Patient Details Name: Cameron Potts MRN: [id number] DOB: [birth year] Today's Date: 01/22/2022   History of Present Illness 86 y.o. male presenting to ED 01/19/22 with complaints of abnormal lab. +hyponatremia; cellulitis LLE; PMH significant of atrial fibrillation on coumadin, HLD, hx of seizures, PD, BPH and hyponatremia    PT Comments    Pt received in chair, requesting to get up and move around, agreeable to return to supine after mobility as he had been up in recliner most of the day. Pt needing up to minA for functional mobility tasks with RW support and BP more stable this session, with SBP drop <20 mmHg after standing ~4 mins. Pt appropriately requesting seated break once fatigued and able to perform x2 short distance gait trials in room. Pt may still benefit from TED hose for improved hemodynamics, plan to have him progress gait distance next session with chair follow for safety. Pt continues to benefit from PT services to progress toward functional mobility goals.   Recommendations for follow up therapy are one component of a multi-disciplinary discharge planning process, led by the attending physician.  Recommendations may be updated based on patient status, additional functional criteria and insurance authorization.  Follow Up Recommendations  Home health PT     Assistance Recommended at Discharge Intermittent Supervision/Assistance  Patient can return home with the following A little help with walking and/or transfers;A little help with bathing/dressing/bathroom;Assistance with cooking/housework;Direct supervision/assist for medications management;Direct supervision/assist for financial management;Assist for transportation;Help with stairs or ramp for entrance   Equipment Recommendations  None recommended by PT    Recommendations for Other Services       Precautions / Restrictions Precautions Precautions: Fall Precaution Comments: h/o  orthostasis with falls Restrictions Weight Bearing Restrictions: No     Mobility  Bed Mobility Overal bed mobility: Needs Assistance Bed Mobility: Sit to Supine       Sit to supine: Min assist   General bed mobility comments: BLE assist to clear edge of bed, use of bed features    Transfers Overall transfer level: Needs assistance Equipment used: Rolling walker (2 wheels) Transfers: Sit to/from Stand Sit to Stand: Min assist           General transfer comment: from recliner and standard chair with arm rests, at times needs x2 attempts to reach upright due to posterior bias. consistent minA lift assist    Ambulation/Gait Ambulation/Gait assistance: Min assist, Min guard Gait Distance (Feet): 20 Feet (22f, 159fwith seated break between) Assistive device: Rolling walker (2 wheels) Gait Pattern/deviations: Step-through pattern, Drifts right/left       General Gait Details: slow pace, pt needs up to minA to manage RW around obstacles in narrow spaces in room and frequent cues for improved proximity to RW     Balance Overall balance assessment: History of Falls                                          Cognition Arousal/Alertness: Awake/alert Behavior During Therapy: Flat affect Overall Cognitive Status: Difficult to assess                                 General Comments: slow to respond but able to answer questions appropriately        Exercises Other Exercises Other Exercises: seated BLE  AROM: LAQ, ankle pumps x10 reps ea    General Comments General comments (skin integrity, edema, etc.): BP 154/65 seated in recliner, then BP 139/64 (84) standing after 4 mins      Pertinent Vitals/Pain Pain Assessment Pain Assessment: Faces Faces Pain Scale: Hurts a little bit Pain Location: pt unable to localize but asking to reposition from prolonged time sitting in chair Pain Descriptors / Indicators: Discomfort Pain  Intervention(s): Monitored during session, Repositioned     PT Goals (current goals can now be found in the care plan section) Acute Rehab PT Goals Patient Stated Goal: return to ALF PT Goal Formulation: With patient Time For Goal Achievement: 02/04/22 Progress towards PT goals: Progressing toward goals    Frequency    Min 3X/week      PT Plan Current plan remains appropriate       AM-PAC PT "6 Clicks" Mobility   Outcome Measure  Help needed turning from your back to your side while in a flat bed without using bedrails?: None Help needed moving from lying on your back to sitting on the side of a flat bed without using bedrails?: A Little Help needed moving to and from a bed to a chair (including a wheelchair)?: A Little Help needed standing up from a chair using your arms (e.g., wheelchair or bedside chair)?: A Little Help needed to walk in hospital room?: A Little Help needed climbing 3-5 steps with a railing? : A Lot 6 Click Score: 18    End of Session Equipment Utilized During Treatment: Gait belt Activity Tolerance: Patient tolerated treatment well Patient left: in bed;with call bell/phone within reach;with bed alarm set;Other (comment) (heels floated) Nurse Communication: Mobility status;Other (comment) (may benefit from TED hose for improved hemodynamics as per OT earlier in day his BP did drop) PT Visit Diagnosis: Other abnormalities of gait and mobility (R26.89);Other symptoms and signs involving the nervous system (R29.898)     Time: 4503-8882 PT Time Calculation (min) (ACUTE ONLY): 30 min  Charges:  $Gait Training: 8-22 mins $Therapeutic Activity: 8-22 mins                     Mikella Linsley P., PTA Acute Rehabilitation Services Secure Chat Preferred 9a-5:30pm Office: [place] 01/22/2022, 7:06 PM

## 2022-01-22 NOTE — Progress Notes (Signed)
ANTICOAGULATION CONSULT NOTE - Follow up  Pharmacy Consult for Warfarin Indication: atrial fibrillation  Allergies  Allergen Reactions   Depakote [Divalproex Sodium] Other (See Comments)    Arthralgias     Vital Signs: Temp: 97.9 F (36.6 C) (08/08 0027) Temp Source: Oral (08/08 0027) BP: 140/83 (08/08 0400) Pulse Rate: 63 (08/08 0400)  Labs: Recent Labs    01/19/22 1139 01/19/22 1143 01/19/22 1658 01/20/22 0250 01/20/22 1456 01/21/22 0844 01/21/22 1230 01/22/22 0456  HGB 11.6* 11.2*  --  11.7*  --  11.6*  --   --   HCT 32.2* 33.0*  --  31.7*  --  31.5*  --   --   PLT 234  --   --  213  --  236  --   --   LABPROT  --   --    < > 31.3*  --  >90.0* 28.3* 26.2*  INR  --   --    < > 3.1*  --  >10.0* 2.7* 2.4*  CREATININE 0.65 0.50*   < > 0.60* 0.74 0.62  --  0.61   < > = values in this interval not displayed.     Estimated Creatinine Clearance: 65.8 mL/min (by C-G formula based on SCr of 0.61 mg/dL).   Assessment: 86 yo male with chronic afib on PTA warfarin, HLD, hx of seizures, parkinson's disease, and BPH admitted for hyponatremia and cellulitis of left leg. Pharmacy consulted to dose warfarin.  Patient PTA warfarin regimen: 2.5 mg daily, except Wednesdays 2 mg.   INR 2.4 today  Goal of Therapy:  INR 2-3 Monitor platelets by anticoagulation protocol: Yes   Plan:  Continue with home dose of coumadin (2.5 mg all days except Wednesday (2 mg)) Check daily INR, CBC, and monitor for s/sx of bleeding  Thank you Anette Guarneri, PharmD  01/22/2022 7:34 AM  Contact: [phone] after 3 PM  "Be curious, not judgmental..." -Jamal Maes --------------------------------------------------------------------------------------------------------------------

## 2022-01-22 NOTE — TOC Initial Note (Signed)
Transition of Care [facility]) - Initial/Assessment Note    Patient Details  Name: Cameron Potts MRN: [id number] Date of Birth: July 20, 1935  Transition of Care [facility]) CM/SW Contact:    Benard Halsted, LCSW Phone Number: 01/22/2022, 8:45 AM  Clinical Narrative:                 CSW received return call from [place], Butch Penny. She stated that if patient requires home health services, they will use their services, just need orders faxed to them (with DC Summary and FL2 to f. [phone]). RN report # will be [phone].   Expected Discharge Plan: Assisted Living Barriers to Discharge: Continued Medical Work up   Patient Goals and CMS Choice Patient states their goals for this hospitalization and ongoing recovery are:: Return home CMS Medicare.gov Compare Post Acute Care list provided to:: Patient Choice offered to / list presented to : Patient  Expected Discharge Plan and Services Expected Discharge Plan: Assisted Living In-house Referral: Clinical Social Work   Post Acute Care Choice: [place] arrangements for the past 2 months: [place]                                      Prior Living Arrangements/Services Living arrangements for the past 2 months: [place] Lives with:: Facility Resident Patient language and need for interpreter reviewed:: Yes        Need for Family Participation in Patient Care: No (Comment) Care giver support system in place?: Yes (comment)   Criminal Activity/Legal Involvement Pertinent to Current Situation/Hospitalization: No - Comment as needed  Activities of Daily Living Home Assistive Devices/Equipment: Environmental consultant (specify type) (front wheel walker) ADL Screening (condition at time of admission) Patient's cognitive ability adequate to safely complete daily activities?: Yes Is the patient deaf or have difficulty hearing?: No Does the patient have difficulty seeing, even when wearing  glasses/contacts?: No Patient able to express need for assistance with ADLs?: Yes Does the patient have difficulty dressing or bathing?: No Independently performs ADLs?: Yes (appropriate for developmental age) Does the patient have difficulty walking or climbing stairs?: Yes (recent fall) Weakness of Legs: Left Weakness of Arms/Hands: None  Permission Sought/Granted         Permission granted to share info w AGENCY: [facility]        Emotional Assessment Appearance:: Appears stated age Attitude/Demeanor/Rapport: Engaged Affect (typically observed): Accepting, Appropriate Orientation: : Oriented to Self, Oriented to Place, Oriented to  Time, Oriented to Situation Alcohol / Substance Use: Not Applicable Psych Involvement: No (comment)  Admission diagnosis:  Hyponatremia [E87.1] Acute hyponatremia [E87.1] Patient Active Problem List   Diagnosis Date Noted   Acute hyponatremia 01/20/2022   Thyroid nodule 01/19/2022   Cellulitis of left lower leg 01/19/2022   Benign prostatic hyperplasia with urinary frequency 07/05/2020   Breakthrough seizure ([place]) 01/30/2019   Intractable seizures ([place]) 01/30/2019   Altered mental status 11/02/2018   Hypercoagulability due to atrial fibrillation ([place]) 03/25/2018   Primary open-angle glaucoma, bilateral, severe stage 03/17/2018   ICD (implantable cardioverter-defibrillator) in place 03/13/2018   Parkinsonian syndrome associated with symptomatic orthostatic hypotension ([place]) 03/13/2018   Refractory epilepsy ([place]) 03/13/2018   Status post VNS (vagus nerve stimulator) placement 03/13/2018   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   Neuropathy, peripheral 12/14/2017   Hyponatremia 07/12/2017   Presence of intraocular lens 04/23/2017   Status epilepticus ([place]) 01/05/2017   Chronic  atrial fibrillation 01/04/2017   Adjustment disorder with anxious mood 03/25/2014   Age-related nuclear cataract of both eyes 03/25/2014   Glaucomatous optic atrophy of both  eyes 03/25/2014   Hyperlipidemia 03/25/2014   Carcinoma in situ of prostate 02/08/2014   Other localized visual field defect, bilateral 02/02/2014   Abnormality of gait 12/21/2013   Complex partial seizure ([place]) 04/11/2013   Seizure disorder ([place]) 04/10/2013   Osteopenia 12/04/2011   Vitamin D deficiency 12/04/2011   PCP:  [name], MD Pharmacy:   [place], [place] - [address] [place] [address] Phone: [phone] Fax: [phone]     Social Determinants of Health (SDOH) Interventions    Readmission Risk Interventions     No data to display

## 2022-01-22 NOTE — Progress Notes (Addendum)
PROGRESS NOTE    Cameron Potts  GLO:[id number] DOB: Oct 25, 1935 DOA: 01/19/2022 PCP: [name], MD   Brief Narrative:  Cameron Potts is a 86 y.o. male with medical history significant of atrial fibrillation on coumadin, HLD, hx of seizures, PD, BPH and hyponatremia presenting to ED with complaints of abnormal lab. He had labs checked at his Pcp and sodium was trending downwards. He had labs done yesterday and sodium was 125 so advised to come to Ed. He was seen in ED a few days ago for a fall, but states he falls from time to time with his PD. He states he hasn't felt as good as he normally does since early today. He denies feeling more tired than normal. He denies any confusion.   Assessment & Plan:   Principal Problem:   Hyponatremia Active Problems:   Cellulitis of left lower leg   Thyroid nodule   Seizure disorder (HCC)   Chronic atrial fibrillation   Parkinsonian syndrome associated with symptomatic orthostatic hypotension (HCC)   Benign prostatic hyperplasia with urinary frequency   Acute hyponatremia  Hyponatremia, acute on chronic, POA Chronic cerebral salt wasting syndrome, POA Long history of hyponatremia dating back to 2012. Average low 130s - acutely drops during infection (last pneumonia - as low as 124) Taking 1g salt tablets BID chronically --> increase to 2g TID and follow clinically 500 NS bolus today - may have to trial 3% saline if ongoing issues. Denies any excess free water intake or exogenous losses, appears euvolemic Stabilizing 129>125>123>122>123>122 Questionably noncompliant with home medications/diet although at assisted living so this is less likely Keppra possibly contributing Fluid restriction 1500cc Follow q12h BMP   Cellulitis of left lower leg, acute He has no leukocytosis, elevated HR/RR Mild disease, continue keflex.  Received 2g rocephin in ED  Possibly contributing to above hyponatremia given history of profound  hyponatremia with infection   Thyroid nodule Incidental finding on CT chest. 2.2x1.6cm Thyroid US - cystic in nature - no indication for further workup/imaging/biopsy at this time.   Seizure disorder [facility]) Seizure precautions  Continue AED: vimpat, keppra, dilantin    Chronic atrial fibrillation Continue flecainide and warfarin Pharmacy to dose coumadin - follow INR  INR erroneously elevated 8/7 - improved without intervention   Parkinsonian syndrome associated with symptomatic orthostatic hypotension (HCC) Hx of PD and orthostasis with falls Fall precautions   Constipation Miralax/sennakot pending - follow clinically  Benign prostatic hyperplasia with urinary frequency Continue proscar and flomax    DVT prophylaxis: Warfarin Code Status: DNR Family Communication: Grandson at bedside  Status is: Inpatient  Dispo: The patient is from: Home              Anticipated d/c is to: TBD              Anticipated d/c date is: TBD; >72h              Patient currently NOT medically stable for discharge  Consultants:  None  Procedures:  None  Antimicrobials:  Keflex   Subjective: No acute issues/events overnight, mental status minimally improving but not yet back to baseline  Objective: Vitals:   01/21/22 1918 01/22/22 0000 01/22/22 0027 01/22/22 0400  BP: (!) 155/69 (!) 110/43 121/85 (!) 140/83  Pulse: 71 61 62 63  Resp: 16   16  Temp: 97.6 F (36.4 C)  97.9 F (36.6 C)   TempSrc: Oral  Oral   SpO2: 95% 94% 96% 96%  Intake/Output Summary (Last 24 hours) at 01/22/2022 0728 Last data filed at 01/22/2022 0160 Gross per 24 hour  Intake --  Output 1450 ml  Net -1450 ml    There were no vitals filed for this visit.  Examination:  General exam: Appears calm and comfortable  Respiratory system: Clear to auscultation. Respiratory effort normal. Cardiovascular system: S1 & S2 heard, RRR. No JVD, murmurs, rubs, gallops or clicks. No pedal edema. Gastrointestinal  system: Abdomen is nondistended, soft and nontender. No organomegaly or masses felt. Normal bowel sounds heard. Central nervous system: Alert and oriented. No focal neurological deficits. Extremities: Symmetric 5 x 5 power. Skin: Blanching erythema LLE anteriorly - improving  Data Reviewed: I have personally reviewed following labs and imaging studies  CBC: Recent Labs  Lab 01/19/22 1139 01/19/22 1143 01/20/22 0250 01/21/22 0844  WBC 5.3  --  4.1 4.3  NEUTROABS 2.4  --   --   --   HGB 11.6* 11.2* 11.7* 11.6*  HCT 32.2* 33.0* 31.7* 31.5*  MCV 90.4  --  91.9 88.5  PLT 234  --  213 109    Basic Metabolic Panel: Recent Labs  Lab 01/19/22 2206 01/20/22 0250 01/20/22 1456 01/21/22 0844 01/22/22 0456  NA 123* 122* 123* 123* 122*  K 4.5 4.1 4.2 4.1 4.4  CL 96* 92* 94* 94* 94*  CO2 20* 20* 21* 19* 20*  GLUCOSE 100* 97 123* 91 90  BUN 8 7* 10 7* 9  CREATININE 0.69 0.60* 0.74 0.62 0.61  CALCIUM 8.1* 8.3* 8.4* 8.7* 8.2*    GFR: Estimated Creatinine Clearance: 65.8 mL/min (by C-G formula based on SCr of 0.61 mg/dL). Liver Function Tests: No results for input(s): "AST", "ALT", "ALKPHOS", "BILITOT", "PROT", "ALBUMIN" in the last 168 hours. No results for input(s): "LIPASE", "AMYLASE" in the last 168 hours. No results for input(s): "AMMONIA" in the last 168 hours. Coagulation Profile: Recent Labs  Lab 01/19/22 1658 01/20/22 0250 01/21/22 0844 01/21/22 1230 01/22/22 0456  INR 2.7* 3.1* >10.0* 2.7* 2.4*    Cardiac Enzymes: No results for input(s): "CKTOTAL", "CKMB", "CKMBINDEX", "TROPONINI" in the last 168 hours. BNP (last 3 results) No results for input(s): "PROBNP" in the last 8760 hours. HbA1C: No results for input(s): "HGBA1C" in the last 72 hours. CBG: No results for input(s): "GLUCAP" in the last 168 hours.  Lipid Profile: No results for input(s): "CHOL", "HDL", "LDLCALC", "TRIG", "CHOLHDL", "LDLDIRECT" in the last 72 hours. Thyroid Function Tests: Recent Labs     01/19/22 1658  TSH 2.685    Anemia Panel: No results for input(s): "VITAMINB12", "FOLATE", "FERRITIN", "TIBC", "IRON", "RETICCTPCT" in the last 72 hours. Sepsis Labs: No results for input(s): "PROCALCITON", "LATICACIDVEN" in the last 168 hours.  No results found for this or any previous visit (from the past 240 hour(s)).       Radiology Studies: No results found.      Scheduled Meds:  ALPRAZolam  0.25 mg Oral BID   brimonidine  1 drop Both Eyes BID   cephALEXin  500 mg Oral TID   finasteride  5 mg Oral q morning   flecainide  50 mg Oral BID   lacosamide  200 mg Oral BID   latanoprost  1 drop Both Eyes QHS   levETIRAcetam  2,250 mg Oral Daily   phenytoin  75 mg Oral Daily   phenytoin  300 mg Oral QHS   sodium chloride flush  3 mL Intravenous Q12H   sodium chloride  2 g Oral TID WC  tamsulosin  0.4 mg Oral Daily   timolol  1 drop Both Eyes BID   warfarin  2.5 mg Oral Once per day on Sun Mon Tue Thu Fri Sat   And   [START ON 01/23/2022] warfarin  2 mg Oral Once per day on Wed   Warfarin - Pharmacist Dosing Inpatient   Does not apply q1600   Continuous Infusions:  sodium chloride       LOS: 2 days   Time spent: 31mn  Stormi Vandevelde C Wes Lezotte, DO [facility]  If 7PM-7AM, please contact night-coverage [email or web address]  01/22/2022, 7:28 AM

## 2022-01-22 NOTE — TOC Progression Note (Signed)
Transition of Care [facility]) - Progression Note    Patient Details  Name: Cameron Potts MRN: [id number] Date of Birth: [birth year]  Transition of Care [facility]) CM/SW [place], [place] Work Phone Number: 01/22/2022, 4:07 PM  Clinical Narrative:    MSW intern spoke with patient at bedside regarding the discharge plan of him returning to [facility]. MSW intern asked if patient would have transportation when it was time for discharge. Patient advised he believed the ALF would provide transportation, but if not, his son could come and pick him up.    Expected Discharge Plan: Assisted Living Barriers to Discharge: Continued Medical Work up  Expected Discharge Plan and Services Expected Discharge Plan: Assisted Living In-house Referral: Clinical Social Work   Post Acute Care Choice: [place] arrangements for the past 2 months: [place]                                       Social Determinants of Health (SDOH) Interventions    Readmission Risk Interventions     No data to display

## 2022-01-23 DIAGNOSIS — E871 Hypo-osmolality and hyponatremia: Secondary | ICD-10-CM | POA: Diagnosis not present

## 2022-01-23 LAB — BASIC METABOLIC PANEL
Anion gap: 8 (ref 5–15)
BUN: 8 mg/dL (ref 8–23)
CO2: 22 mmol/L (ref 22–32)
Calcium: 8.7 mg/dL — ABNORMAL LOW (ref 8.9–10.3)
Chloride: 94 mmol/L — ABNORMAL LOW (ref 98–111)
Creatinine, Ser: 0.57 mg/dL — ABNORMAL LOW (ref 0.61–1.24)
GFR, Estimated: >60 mL/min (ref >60)
Glucose, Bld: 92 mg/dL (ref 70–99)
Potassium: 4.2 mmol/L (ref 3.5–5.1)
Sodium: 124 mmol/L — ABNORMAL LOW (ref 135–145)

## 2022-01-23 LAB — PROTIME-INR
INR: 2.3 — ABNORMAL HIGH (ref 0.8–1.2)
Prothrombin Time: 25 seconds — ABNORMAL HIGH (ref 11.4–15.2)

## 2022-01-23 NOTE — Progress Notes (Signed)
PROGRESS NOTE    Cameron Potts  NFA:[id number] DOB: [birth year] DOA: 01/19/2022 PCP: [name], MD   Brief Narrative:  Cameron Potts is a 86 y.o. male with medical history significant of atrial fibrillation on coumadin, HLD, hx of seizures, PD, BPH and hyponatremia presenting to ED with complaints of abnormal lab. He had labs checked at his Pcp and sodium was trending downwards. He had labs done yesterday and sodium was 125 so advised to come to Ed. He was seen in ED a few days ago for a fall, but states he falls from time to time with his PD. He states he hasn't felt as good as he normally does since early today. He denies feeling more tired than normal. He denies any confusion.   Assessment & Plan:   Principal Problem:   Hyponatremia Active Problems:   Cellulitis of left lower leg   Thyroid nodule   Seizure disorder (HCC)   Chronic atrial fibrillation   Parkinsonian syndrome associated with symptomatic orthostatic hypotension (HCC)   Benign prostatic hyperplasia with urinary frequency   Acute hyponatremia  Hyponatremia, acute on chronic, POA Chronic cerebral salt wasting syndrome, POA Long history of hyponatremia dating back to 2012. Average low 130s - acutely drops during infection (last pneumonia - as low as 124) Taking 1g salt tablets BID chronically --> increase to 2g TID and follow clinically Status post 500 cc normal saline bolus with improving sodium Denies any excess free water intake or exogenous losses, appears euvolemic Stabilizing 129>125>123>122>123>122>124 Questionably noncompliant with home medications/diet although at assisted living so this is less likely Keppra possibly contributing Fluid restriction 1500cc Follow morning labs   Cellulitis of left lower leg, acute, resolving He has no leukocytosis, elevated HR/RR Mild disease, continue keflex.  Possibly contributing to above hyponatremia given history of profound hyponatremia with prior  infections   Thyroid nodule Incidental finding on CT chest. 2.2x1.6cm Thyroid US - cystic in nature - no indication for further workup/imaging/biopsy at this time.   Seizure disorder [facility]) Seizure precautions  Continue AED: vimpat, keppra, dilantin    Chronic atrial fibrillation Continue flecainide and warfarin Pharmacy to dose coumadin - follow INR  INR erroneously elevated 8/7 - improved without intervention   Parkinsonian syndrome associated with symptomatic orthostatic hypotension (HCC) Hx of PD and orthostasis with falls Fall precautions   Constipation Miralax/sennakot pending - follow clinically  Benign prostatic hyperplasia with urinary frequency Continue proscar and flomax    DVT prophylaxis: Warfarin Code Status: DNR Family Communication: Wife at bedside  Status is: Inpatient  Dispo: The patient is from: Assisted living              Anticipated d/c is to: TBD              Anticipated d/c date is: 24 to 48 hours              Patient currently NOT medically stable for discharge  Consultants:  None  Procedures:  None  Antimicrobials:  Keflex   Subjective: No acute issues/events overnight, mental status minimally improving but not yet back to baseline per wife at bedside  Objective: Vitals:   01/22/22 1656 01/22/22 2034 01/22/22 2348 01/23/22 0355  BP: (!) 147/69 (!) 154/53 (!) 127/52 (!) 146/61  Pulse: 60 60 (!) 59 62  Resp:  '20 18 18  '$ Temp: 97.6 F (36.4 C) 97.8 F (36.6 C) 97.8 F (36.6 C) 98.7 F (37.1 C)  TempSrc: Oral Oral Oral Oral  SpO2:        Intake/Output Summary (Last 24 hours) at 01/23/2022 0750 Last data filed at 01/23/2022 0355 Gross per 24 hour  Intake --  Output 880 ml  Net -880 ml    There were no vitals filed for this visit.  Examination:  General exam: Appears calm and comfortable  Respiratory system: Clear to auscultation. Respiratory effort normal. Cardiovascular system: S1 & S2 heard, RRR. No JVD, murmurs, rubs,  gallops or clicks. No pedal edema. Gastrointestinal system: Abdomen is nondistended, soft and nontender. No organomegaly or masses felt. Normal bowel sounds heard. Central nervous system: Alert and oriented. No focal neurological deficits. Extremities: Symmetric 5 x 5 power. Skin: Blanching erythema LLE anteriorly - improving  Data Reviewed: I have personally reviewed following labs and imaging studies  CBC: Recent Labs  Lab 01/19/22 1139 01/19/22 1143 01/20/22 0250 01/21/22 0844  WBC 5.3  --  4.1 4.3  NEUTROABS 2.4  --   --   --   HGB 11.6* 11.2* 11.7* 11.6*  HCT 32.2* 33.0* 31.7* 31.5*  MCV 90.4  --  91.9 88.5  PLT 234  --  213 010    Basic Metabolic Panel: Recent Labs  Lab 01/20/22 0250 01/20/22 1456 01/21/22 0844 01/22/22 0456 01/23/22 0533  NA 122* 123* 123* 122* 124*  K 4.1 4.2 4.1 4.4 4.2  CL 92* 94* 94* 94* 94*  CO2 20* 21* 19* 20* 22  GLUCOSE 97 123* 91 90 92  BUN 7* 10 7* 9 8  CREATININE 0.60* 0.74 0.62 0.61 0.57*  CALCIUM 8.3* 8.4* 8.7* 8.2* 8.7*    GFR: Estimated Creatinine Clearance: 65.8 mL/min (A) (by C-G formula based on SCr of 0.57 mg/dL (L)). Liver Function Tests: No results for input(s): "AST", "ALT", "ALKPHOS", "BILITOT", "PROT", "ALBUMIN" in the last 168 hours. No results for input(s): "LIPASE", "AMYLASE" in the last 168 hours. No results for input(s): "AMMONIA" in the last 168 hours. Coagulation Profile: Recent Labs  Lab 01/20/22 0250 01/21/22 0844 01/21/22 1230 01/22/22 0456 01/23/22 0533  INR 3.1* >10.0* 2.7* 2.4* 2.3*    Cardiac Enzymes: No results for input(s): "CKTOTAL", "CKMB", "CKMBINDEX", "TROPONINI" in the last 168 hours. BNP (last 3 results) No results for input(s): "PROBNP" in the last 8760 hours. HbA1C: No results for input(s): "HGBA1C" in the last 72 hours. CBG: No results for input(s): "GLUCAP" in the last 168 hours.  Lipid Profile: No results for input(s): "CHOL", "HDL", "LDLCALC", "TRIG", "CHOLHDL", "LDLDIRECT"  in the last 72 hours. Thyroid Function Tests: No results for input(s): "TSH", "T4TOTAL", "FREET4", "T3FREE", "THYROIDAB" in the last 72 hours.  Anemia Panel: No results for input(s): "VITAMINB12", "FOLATE", "FERRITIN", "TIBC", "IRON", "RETICCTPCT" in the last 72 hours. Sepsis Labs: No results for input(s): "PROCALCITON", "LATICACIDVEN" in the last 168 hours.  No results found for this or any previous visit (from the past 240 hour(s)).       Radiology Studies: No results found.      Scheduled Meds:  ALPRAZolam  0.25 mg Oral BID   brimonidine  1 drop Both Eyes BID   cephALEXin  500 mg Oral TID   finasteride  5 mg Oral q morning   flecainide  50 mg Oral BID   lacosamide  200 mg Oral BID   latanoprost  1 drop Both Eyes QHS   levETIRAcetam  2,250 mg Oral Daily   phenytoin  75 mg Oral Daily   phenytoin  300 mg Oral QHS   polyethylene glycol  17 g Oral  Daily   sodium chloride flush  3 mL Intravenous Q12H   sodium chloride  2 g Oral TID WC   tamsulosin  0.4 mg Oral Daily   timolol  1 drop Both Eyes BID   warfarin  2.5 mg Oral Once per day on Sun Mon Tue Thu Fri Sat   And   warfarin  2 mg Oral Once per day on Wed   Warfarin - Pharmacist Dosing Inpatient   Does not apply q1600   Continuous Infusions:  sodium chloride       LOS: 3 days   Time spent: 65mn  Lantz Hermann C Christophere Hillhouse, DO [facility]  If 7PM-7AM, please contact night-coverage [email or web address]  01/23/2022, 7:50 AM

## 2022-01-23 NOTE — Care Management Important Message (Signed)
Important Message  Patient Details  Name: Cameron Potts MRN: [id number] Date of Birth: 03-14-1936   Medicare Important Message Given:  Yes     Orbie Pyo 01/23/2022, 12:38 PM

## 2022-01-23 NOTE — Progress Notes (Signed)
ANTICOAGULATION CONSULT NOTE - Follow up  Pharmacy Consult for Warfarin Indication: atrial fibrillation  Allergies  Allergen Reactions   Depakote [Divalproex Sodium] Other (See Comments)    Arthralgias     Vital Signs: Temp: 98.7 F (37.1 C) (08/09 0355) Temp Source: Oral (08/09 0355) BP: 146/61 (08/09 0355) Pulse Rate: 62 (08/09 0355)  Labs: Recent Labs    01/21/22 0844 01/21/22 1230 01/22/22 0456 01/23/22 0533  HGB 11.6*  --   --   --   HCT 31.5*  --   --   --   PLT 236  --   --   --   LABPROT >90.0* 28.3* 26.2* 25.0*  INR >10.0* 2.7* 2.4* 2.3*  CREATININE 0.62  --  0.61 0.57*     Estimated Creatinine Clearance: 65.8 mL/min (A) (by C-G formula based on SCr of 0.57 mg/dL (L)).   Assessment: 86 yo male with chronic afib on PTA warfarin, HLD, hx of seizures, parkinson's disease, and BPH admitted for hyponatremia and cellulitis of left leg. Pharmacy consulted to dose warfarin.  Patient PTA warfarin regimen: 2.5 mg daily, except Wednesdays 2 mg.   INR 2.3 today  Goal of Therapy:  INR 2-3 Monitor platelets by anticoagulation protocol: Yes   Plan:  Continue with home dose of coumadin (2.5 mg all days except Wednesday (2 mg)) Check daily INR, CBC, and monitor for s/sx of bleeding  Supriya Beaston A. Levada Dy, PharmD, BCPS, FNKF Clinical Pharmacist [place] Please utilize Amion for appropriate phone number to reach the unit pharmacist ([place])  --------------------------------------------------------------------------------------------------------------------

## 2022-01-24 DIAGNOSIS — E871 Hypo-osmolality and hyponatremia: Secondary | ICD-10-CM | POA: Diagnosis not present

## 2022-01-24 LAB — BASIC METABOLIC PANEL
Anion gap: 8 (ref 5–15)
BUN: 8 mg/dL (ref 8–23)
CO2: 20 mmol/L — ABNORMAL LOW (ref 22–32)
Calcium: 8.3 mg/dL — ABNORMAL LOW (ref 8.9–10.3)
Chloride: 95 mmol/L — ABNORMAL LOW (ref 98–111)
Creatinine, Ser: 0.53 mg/dL — ABNORMAL LOW (ref 0.61–1.24)
GFR, Estimated: >60 mL/min (ref >60)
Glucose, Bld: 96 mg/dL (ref 70–99)
Potassium: 4.4 mmol/L (ref 3.5–5.1)
Sodium: 123 mmol/L — ABNORMAL LOW (ref 135–145)

## 2022-01-24 LAB — PROTIME-INR
INR: 2.1 — ABNORMAL HIGH (ref 0.8–1.2)
Prothrombin Time: 23 seconds — ABNORMAL HIGH (ref 11.4–15.2)

## 2022-01-24 LAB — URIC ACID: Uric Acid, Serum: 2.9 mg/dL — ABNORMAL LOW (ref 3.7–8.6)

## 2022-01-24 MED ORDER — COSYNTROPIN 0.25 MG IJ SOLR
0.2500 mg | Freq: Once | INTRAMUSCULAR | Status: AC
  Administered 2022-01-25: 0.25 mg via INTRAVENOUS
  Filled 2022-01-24: qty 0.25

## 2022-01-24 NOTE — Progress Notes (Signed)
Physical Therapy Treatment Patient Details Name: Cameron Potts MRN: [id number] DOB: 04/30/1936 Today's Date: 01/24/2022   History of Present Illness 86 y.o. male presenting to ED 01/19/22 with complaints of abnormal lab. +hyponatremia; cellulitis LLE; PMH significant of atrial fibrillation on coumadin, HLD, hx of seizures, PD, BPH and hyponatremia.    PT Comments    Pt received seated on BSC, agreeable to therapy session with emphasis on transfer training with close vitals monitoring. Pt found to be still orthostatic despite B knee high TED hose, seated breaks and seated/standing UE/LE exercises. Pt performed pre-gait tasks at bedside including standing marches. BP 156/72 seated EOB, 120/90 standing at 1 minute and 124/55 standing at 3 minutes, RN/MD notified. Pt back in bed and placed in chair posture with heels floated for pressure relief, spouse present. Pt continues to benefit from PT services to progress toward functional mobility goals.   Recommendations for follow up therapy are one component of a multi-disciplinary discharge planning process, led by the attending physician.  Recommendations may be updated based on patient status, additional functional criteria and insurance authorization.  Follow Up Recommendations  Home health PT     Assistance Recommended at Discharge Intermittent Supervision/Assistance  Patient can return home with the following A little help with walking and/or transfers;A little help with bathing/dressing/bathroom;Assistance with cooking/housework;Direct supervision/assist for medications management;Direct supervision/assist for financial management;Assist for transportation;Help with stairs or ramp for entrance   Equipment Recommendations  None recommended by PT    Recommendations for Other Services       Precautions / Restrictions Precautions Precautions: Fall Precaution Comments: h/o orthostasis with falls     Mobility  Bed Mobility Overal bed  mobility: Needs Assistance Bed Mobility: Sit to Sidelying, Rolling Rolling: Min guard       Sit to sidelying: Min assist General bed mobility comments: BLE assist over edge of bed    Transfers Overall transfer level: Needs assistance Equipment used: Rolling walker (2 wheels) Transfers: Sit to/from Stand, Bed to chair/wheelchair/BSC Sit to Stand: Min assist, Min guard   Step pivot transfers: Min assist       General transfer comment: from BSC<>EOB, standing x3 reps from EOB    Ambulation/Gait Ambulation/Gait assistance: Min assist Gait Distance (Feet): 5 Feet Assistive device: Rolling walker (2 wheels) Gait Pattern/deviations: Step-to pattern, Shuffle, Narrow base of support, Knee flexed in stance - left, Knee flexed in stance - right, Leaning posteriorly     Pre-gait activities: standing hip flexion x15 reps ea General Gait Details: posterior lean; pt performed pivotal and sidesteps at bedside and pre-gait marching at bedside x15 reps but orthostatic even after seated breaks and UE/LE exercises so defer gait progression for pt safety   Stairs             Wheelchair Mobility    Modified Rankin (Stroke Patients Only)       Balance Overall balance assessment: Needs assistance Sitting-balance support: Feet supported, Bilateral upper extremity supported Sitting balance-Leahy Scale: Fair Sitting balance - Comments: does not tolerate dynamic challenge (needs minA for seated LE exercises) Postural control: Posterior lean Standing balance support: Reliant on assistive device for balance Standing balance-Leahy Scale: Poor Standing balance comment: needs consistent external assist during gait and pre-gait tasks at bedside-posterior lean                            Cognition Arousal/Alertness: Awake/alert Behavior During Therapy: Flat affect Overall Cognitive Status: Impaired/Different from baseline  Area of Impairment: Problem solving                              Problem Solving: Slow processing, Decreased initiation, Difficulty sequencing, Requires verbal cues, Requires tactile cues General Comments: Slow to respond but able to answer questions appropriately. Pt with difficulty multitasking. Pt spouse present and supportive.        Exercises Other Exercises Other Exercises: seated BLE AROM: LAQ, ankle pumps, hip flexion x15 reps ea Other Exercises: standing BLE AROM: hip flexion x15 reps ea Other Exercises: STS x 3 reps (reciprocal but with brief seated breaks between) Other Exercises: IS x10 reps (200-774m) and seated BUE AAROM: arm bicycle x10 reps for improved hemodynamics   General Comments General comments (skin integrity, edema, etc.): BP 156/78 seated initially, then 131/101 standing at bedside; took again seated BP 156/72 and standing BP 120/90 and after 3 mins standing BP 124/55.      Pertinent Vitals/Pain Pain Assessment Pain Assessment: No/denies pain Pain Intervention(s): Monitored during session, Repositioned           PT Goals (current goals can now be found in the care plan section) Acute Rehab PT Goals Patient Stated Goal: return to ALF PT Goal Formulation: With patient Time For Goal Achievement: 02/04/22 Progress towards PT goals: Progressing toward goals    Frequency    Min 3X/week      PT Plan Current plan remains appropriate       AM-PAC PT "6 Clicks" Mobility   Outcome Measure  Help needed turning from your back to your side while in a flat bed without using bedrails?: A Little Help needed moving from lying on your back to sitting on the side of a flat bed without using bedrails?: A Little Help needed moving to and from a bed to a chair (including a wheelchair)?: A Little Help needed standing up from a chair using your arms (e.g., wheelchair or bedside chair)?: A Little Help needed to walk in hospital room?: A Little Help needed climbing 3-5 steps with a railing? : A Lot 6 Click  Score: 17    End of Session Equipment Utilized During Treatment: Gait belt Activity Tolerance: Treatment limited secondary to medical complications (Comment);Other (comment);Patient limited by fatigue (orthostatic hypotension) Patient left: in bed;with call bell/phone within reach;with bed alarm set;Other (comment) (heels floated) Nurse Communication: Mobility status;Other (comment) (Pt still orthostatic) PT Visit Diagnosis: Other abnormalities of gait and mobility (R26.89);Other symptoms and signs involving the nervous system (R29.898)     Time: [address]PT Time Calculation (min) (ACUTE ONLY): 36 min  Charges:  $Therapeutic Exercise: 8-22 mins $Therapeutic Activity: 8-22 mins                     Brooklynne Pereida P., PTA Acute Rehabilitation Services Secure Chat Preferred 9a-5:30pm Office: [place]8/03/2022, 5:39 PM

## 2022-01-24 NOTE — Progress Notes (Signed)
Occupational Therapy Treatment Patient Details Name: Cameron Potts MRN: [id number] DOB: [birth year] Today's Date: 01/24/2022   History of present illness 86 y.o. male presenting to ED 01/19/22 with complaints of abnormal lab. +hyponatremia; cellulitis LLE; PMH significant of atrial fibrillation on coumadin, HLD, hx of seizures, PD, BPH and hyponatremia   OT comments  Pt with sleepiness although he was willing to work with OT. Min assist to EOB with increased time. Completed grooming x 3 activities at EOB and then asked to return to supine. Pt with BP 115/58 seated EOB.   Recommendations for follow up therapy are one component of a multi-disciplinary discharge planning process, led by the attending physician.  Recommendations may be updated based on patient status, additional functional criteria and insurance authorization.    Follow Up Recommendations  Home health OT (at ALF)    Assistance Recommended at Discharge Frequent or constant Supervision/Assistance  Patient can return home with the following  A little help with walking and/or transfers;A lot of help with bathing/dressing/bathroom;Assistance with cooking/housework;Direct supervision/assist for medications management;Direct supervision/assist for financial management;Assist for transportation;Help with stairs or ramp for entrance   Equipment Recommendations  BSC/3in1;Tub/shower seat    Recommendations for Other Services      Precautions / Restrictions Precautions Precautions: Fall Precaution Comments: h/o orthostasis with falls       Mobility Bed Mobility Overal bed mobility: Needs Assistance Bed Mobility: Supine to Sit, Sit to Supine     Supine to sit: Min assist Sit to supine: Min assist   General bed mobility comments: HOB, use of rail, increased time    Transfers                   General transfer comment: pt declined OOB to sink for ADLs or to chair for eating lunch citing sleepiness he  attributed to medication     Balance Overall balance assessment: Needs assistance   Sitting balance-Leahy Scale: Fair                                     ADL either performed or assessed with clinical judgement   ADL Overall ADL's : Needs assistance/impaired Eating/Feeding: Set up;Bed level   Grooming: Wash/dry hands;Wash/dry face;Brushing hair;Oral care;Sitting;Minimal assistance                                      Extremity/Trunk Assessment              Vision       Perception     Praxis      Cognition Arousal/Alertness: Awake/alert Behavior During Therapy: Flat affect Overall Cognitive Status: Impaired/Different from baseline Area of Impairment: Problem solving                             Problem Solving: Slow processing, Decreased initiation, Difficulty sequencing, Requires verbal cues, Requires tactile cues          Exercises      Shoulder Instructions       General Comments      Pertinent Vitals/ Pain       Pain Assessment Pain Assessment: No/denies pain  Home Living  Prior Functioning/Environment              Frequency  Min 2X/week        Progress Toward Goals  OT Goals(current goals can now be found in the care plan section)  Progress towards OT goals: Progressing toward goals  Acute Rehab OT Goals OT Goal Formulation: With patient Time For Goal Achievement: 02/05/22 Potential to Achieve Goals: Good  Plan Discharge plan remains appropriate    Co-evaluation                 AM-PAC OT "6 Clicks" Daily Activity     Outcome Measure   Help from another person eating meals?: A Little Help from another person taking care of personal grooming?: A Little Help from another person toileting, which includes using toliet, bedpan, or urinal?: A Little Help from another person bathing (including washing, rinsing, drying)?: A  Lot Help from another person to put on and taking off regular upper body clothing?: A Little Help from another person to put on and taking off regular lower body clothing?: A Lot 6 Click Score: 16    End of Session    OT Visit Diagnosis: Unsteadiness on feet (R26.81);Other abnormalities of gait and mobility (R26.89);Repeated falls (R29.6);Muscle weakness (generalized) (M62.81);History of falling (Z91.81)   Activity Tolerance Patient limited by fatigue   Patient Left in bed;with call bell/phone within reach;with bed alarm set   Nurse Communication          Time: 5868-2574 OT Time Calculation (min): 26 min  Charges: OT General Charges $OT Visit: 1 Visit OT Treatments $Self Care/Home Management : 23-37 mins  Cleta Alberts, OTR/L Acute Rehabilitation Services Office: [phone]   Malka So 01/24/2022, 12:29 PM

## 2022-01-24 NOTE — Progress Notes (Signed)
PROGRESS NOTE    Cameron Potts  JKD:[id number] DOB: 1935/12/13 DOA: 01/19/2022 PCP: [name], MD   Brief Narrative:  Cameron Potts is a 86 y.o. male with medical history significant of atrial fibrillation on coumadin, HLD, hx of seizures, PD, BPH and hyponatremia presenting to ED with complaints of abnormal lab. He had labs checked at his Pcp and sodium was trending downwards. He had labs done yesterday and sodium was 125 so advised to come to Ed. He was seen in ED a few days ago for a fall, but states he falls from time to time with his PD. He states he hasn't felt as good as he normally does since early today. He denies feeling more tired than normal. He denies any confusion.   Assessment & Plan:   Principal Problem:   Hyponatremia Active Problems:   Cellulitis of left lower leg   Thyroid nodule   Seizure disorder (HCC)   Chronic atrial fibrillation   Parkinsonian syndrome associated with symptomatic orthostatic hypotension (HCC)   Benign prostatic hyperplasia with urinary frequency   Acute hyponatremia   Hyponatremia, acute on chronic, POA Chronic cerebral salt wasting syndrome, POA Rule out secondary causes of salt loss Long history of hyponatremia dating back to 2012. Average low 130s - acutely drops during infection (last pneumonia - as low as 124) Taking 1g salt tablets BID chronically --> increase to 2g TID and follow clinically Status post 500 cc normal saline bolus with improving sodium Denies any excess free water intake or exogenous losses, appears euvolemic Stabilizing 129>125>123>122>123>122>124>123 Nephrology asked to evaluated - concern for adrenal insufficiency vs drug induced SIADH Keppra possibly contributing as above Fluid restriction 1500cc Follow morning labs   Cellulitis of left lower leg, acute, resolved He has no leukocytosis, elevated HR/RR Mild disease, continue keflex for 10 day course Possibly contributing to above  hyponatremia given history of profound hyponatremia with prior infections   Thyroid nodule Incidental finding on CT chest. 2.2x1.6cm Thyroid US - cystic in nature - no indication for further workup/imaging/biopsy at this time.   Seizure disorder [facility]) Seizure precautions  Continue AED: vimpat, keppra, dilantin    Chronic atrial fibrillation Continue flecainide and warfarin Pharmacy to dose coumadin - follow INR  INR erroneously elevated 8/7 - improved without intervention   Parkinsonian syndrome associated with symptomatic orthostatic hypotension (HCC) Hx of PD and orthostasis with falls Fall precautions   Constipation Miralax/sennakot pending - follow clinically  Benign prostatic hyperplasia with urinary frequency Continue proscar and flomax    DVT prophylaxis: Warfarin Code Status: DNR Family Communication: Wife at bedside  Status is: Inpatient  Dispo: The patient is from: Assisted living              Anticipated d/c is to: TBD              Anticipated d/c date is: 24 to 48 hours              Patient currently NOT medically stable for discharge  Consultants:  None  Procedures:  None  Antimicrobials:  Keflex   Subjective: No acute issues/events overnight, mental status minimally improving but not yet back to baseline per wife at bedside  Objective: Vitals:   01/23/22 1600 01/23/22 2040 01/24/22 0000 01/24/22 0345  BP: (!) 143/62 (!) 149/60 (!) 142/60 138/61  Pulse: 61 67 60 60  Resp: 18 18    Temp: 98.1 F (36.7 C) 98 F (36.7 C) 97.9 F (36.6 C)  98.1 F (36.7 C)  TempSrc: Oral Oral Oral Oral  SpO2:  99% 99% 100%    Intake/Output Summary (Last 24 hours) at 01/24/2022 0749 Last data filed at 01/24/2022 0600 Gross per 24 hour  Intake --  Output 1700 ml  Net -1700 ml    There were no vitals filed for this visit.  Examination:  General exam: Appears calm and comfortable  Respiratory system: Clear to auscultation. Respiratory effort  normal. Cardiovascular system: S1 & S2 heard, RRR. No JVD, murmurs, rubs, gallops or clicks. No pedal edema. Gastrointestinal system: Abdomen is nondistended, soft and nontender. No organomegaly or masses felt. Normal bowel sounds heard. Central nervous system: Alert and oriented. No focal neurological deficits. Extremities: Symmetric 5 x 5 power. Skin: Blanching erythema LLE anteriorly - improving  Data Reviewed: I have personally reviewed following labs and imaging studies  CBC: Recent Labs  Lab 01/19/22 1139 01/19/22 1143 01/20/22 0250 01/21/22 0844  WBC 5.3  --  4.1 4.3  NEUTROABS 2.4  --   --   --   HGB 11.6* 11.2* 11.7* 11.6*  HCT 32.2* 33.0* 31.7* 31.5*  MCV 90.4  --  91.9 88.5  PLT 234  --  213 379    Basic Metabolic Panel: Recent Labs  Lab 01/20/22 1456 01/21/22 0844 01/22/22 0456 01/23/22 0533 01/24/22 0352  NA 123* 123* 122* 124* 123*  K 4.2 4.1 4.4 4.2 4.4  CL 94* 94* 94* 94* 95*  CO2 21* 19* 20* 22 20*  GLUCOSE 123* 91 90 92 96  BUN 10 7* '9 8 8  '$ CREATININE 0.74 0.62 0.61 0.57* 0.53*  CALCIUM 8.4* 8.7* 8.2* 8.7* 8.3*    GFR: Estimated Creatinine Clearance: 65.8 mL/min (A) (by C-G formula based on SCr of 0.53 mg/dL (L)). Liver Function Tests: No results for input(s): "AST", "ALT", "ALKPHOS", "BILITOT", "PROT", "ALBUMIN" in the last 168 hours. No results for input(s): "LIPASE", "AMYLASE" in the last 168 hours. No results for input(s): "AMMONIA" in the last 168 hours. Coagulation Profile: Recent Labs  Lab 01/21/22 0844 01/21/22 1230 01/22/22 0456 01/23/22 0533 01/24/22 0352  INR >10.0* 2.7* 2.4* 2.3* 2.1*    Cardiac Enzymes: No results for input(s): "CKTOTAL", "CKMB", "CKMBINDEX", "TROPONINI" in the last 168 hours. BNP (last 3 results) No results for input(s): "PROBNP" in the last 8760 hours. HbA1C: No results for input(s): "HGBA1C" in the last 72 hours. CBG: No results for input(s): "GLUCAP" in the last 168 hours.  Lipid Profile: No  results for input(s): "CHOL", "HDL", "LDLCALC", "TRIG", "CHOLHDL", "LDLDIRECT" in the last 72 hours. Thyroid Function Tests: No results for input(s): "TSH", "T4TOTAL", "FREET4", "T3FREE", "THYROIDAB" in the last 72 hours.  Anemia Panel: No results for input(s): "VITAMINB12", "FOLATE", "FERRITIN", "TIBC", "IRON", "RETICCTPCT" in the last 72 hours. Sepsis Labs: No results for input(s): "PROCALCITON", "LATICACIDVEN" in the last 168 hours.  No results found for this or any previous visit (from the past 240 hour(s)).       Radiology Studies: No results found.      Scheduled Meds:  ALPRAZolam  0.25 mg Oral BID   brimonidine  1 drop Both Eyes BID   cephALEXin  500 mg Oral TID   finasteride  5 mg Oral q morning   flecainide  50 mg Oral BID   lacosamide  200 mg Oral BID   latanoprost  1 drop Both Eyes QHS   levETIRAcetam  2,250 mg Oral Daily   phenytoin  75 mg Oral Daily   phenytoin  300  mg Oral QHS   polyethylene glycol  17 g Oral Daily   sodium chloride flush  3 mL Intravenous Q12H   sodium chloride  2 g Oral TID WC   tamsulosin  0.4 mg Oral Daily   timolol  1 drop Both Eyes BID   warfarin  2.5 mg Oral Once per day on Sun Mon Tue Thu Fri Sat   And   warfarin  2 mg Oral Once per day on Wed   Warfarin - Pharmacist Dosing Inpatient   Does not apply q1600   Continuous Infusions:  sodium chloride       LOS: 4 days   Time spent: 40mn  Gem Conkle C Haili Donofrio, DO [facility]  If 7PM-7AM, please contact night-coverage [email or web address]  01/24/2022, 7:49 AM

## 2022-01-24 NOTE — Consult Note (Signed)
Reason for Consult: Hyponatremia-acute on chronic Referring Physician: Holli Humbles MD [facility])  HPI:  86 year old male with past medical history significant for seizure disorder, prior history of craniotomy for right anterior temporal lesion resection, Parkinson disease status post vagal nerve stimulator, history of atrial fibrillation on anticoagulation with Coumadin, BPH and previous documentation of hyponatremia (132-134) with worsening during acute illness.  He was admitted through the emergency room 5 days ago after noticed to have hyponatremia of 125 associated with a fall and head injury with small scalp laceration.  He was felt to have had some confusion and mental slowing around the time of his injury but had resolved at the time of admission.  He was on oral cephalexin for right leg/foot cellulitis.  Significant labs: Sodium 123, serum osmolality 258, urine osmolality 436, urine sodium 142, TSH 2.685, a.m. cortisol 3.6 mcg/dL.  He was on sodium chloride tablets 1 g twice daily prior to admission.  Since admission, he has been on fluid restriction with increase of his sodium chloride supplement to 6 g a day but sodium level remains depressed at 123.  He denies any nausea or vomiting and is not noted to be polyuric.  He has had some transiently low blood pressures without documentation of orthostatic drop.  It is suspected that he has salt wasting nephropathy (CSW).  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 He has never used smokeless tobacco. He reports that he does not currently use alcohol. He reports that he does not use drugs.  Allergies:  Allergies  Allergen Reactions   Depakote [Divalproex Sodium] Other (See Comments)    Arthralgias     Medications: I have reviewed the patient's current medications. Scheduled:  ALPRAZolam  0.25 mg Oral BID   brimonidine  1 drop Both Eyes BID   cephALEXin  500 mg Oral TID   finasteride  5 mg Oral q morning   flecainide  50 mg Oral BID   lacosamide  200 mg Oral BID   latanoprost  1 drop Both Eyes QHS   levETIRAcetam  2,250 mg Oral Daily   phenytoin  75 mg Oral Daily   phenytoin  300 mg Oral QHS   polyethylene glycol  17 g Oral Daily   sodium chloride flush  3 mL Intravenous Q12H   sodium chloride  2 g Oral TID WC   tamsulosin  0.4 mg Oral Daily   timolol  1 drop Both Eyes BID   warfarin  2.5 mg Oral Once per day on Sun Mon Tue Thu Fri Sat   And   warfarin  2 mg Oral Once per day on Wed   Warfarin - Pharmacist Dosing Inpatient   Does not apply q1600       Latest Ref Rng & Units 01/24/2022    3:52 AM 01/23/2022    5:33 AM 01/22/2022    4:56 AM  BMP  Glucose 70 - 99 mg/dL 96  92  90   BUN 8 - 23 mg/dL '8  8  9   '$ Creatinine 0.61 - 1.24 mg/dL 0.53  0.57  0.61   Sodium 135 - 145 mmol/L 123  124  122   Potassium 3.5 - 5.1 mmol/L 4.4  4.2  4.4   Chloride 98 - 111 mmol/L 95  94  94   CO2 22 - 32 mmol/L '20  22  20   '$ Calcium 8.9 - 10.3 mg/dL 8.3  8.7  8.2       Latest Ref  Rng & Units 01/21/2022    8:44 AM 01/20/2022    2:50 AM 01/19/2022   11:43 AM  CBC  WBC 4.0 - 10.5 K/uL 4.3  4.1    Hemoglobin 13.0 - 17.0 g/dL 11.6  11.7  11.2   Hematocrit 39.0 - 52.0 % 31.5  31.7  33.0   Platelets 150 - 400 K/uL 236  213     Urinalysis    Component Value Date/Time   COLORURINE STRAW (A) 01/19/2022 1458   APPEARANCEUR CLEAR 01/19/2022 1458   LABSPEC 1.010 01/19/2022 1458   PHURINE 7.0 01/19/2022 1458   GLUCOSEU NEGATIVE 01/19/2022 1458   HGBUR NEGATIVE 01/19/2022 1458   BILIRUBINUR NEGATIVE 01/19/2022 1458   KETONESUR NEGATIVE 01/19/2022 1458   PROTEINUR NEGATIVE 01/19/2022 1458   UROBILINOGEN 0.2 04/10/2013 0910   NITRITE NEGATIVE 01/19/2022 1458   LEUKOCYTESUR NEGATIVE 01/19/2022 1458      No results found.  Review of Systems  Constitutional:  Positive for activity change and fatigue. Negative for appetite change and fever.  HENT:  Negative for hearing loss, nosebleeds and sore throat.   Eyes:  Negative for pain and visual disturbance.  Respiratory:  Negative for cough, chest tightness and shortness of breath.   Cardiovascular:  Positive for leg swelling. Negative for chest pain.       Right leg-cellulitis  Gastrointestinal:  Negative for abdominal pain, diarrhea, nausea and vomiting.  Genitourinary:  Negative for dysuria, frequency, hematuria and urgency.  Musculoskeletal:  Negative for arthralgias, back pain and myalgias.  Neurological:  Positive for dizziness and weakness.   Blood pressure (!) 115/58, pulse 63, temperature 98 F (36.7 C), temperature source Oral, resp. rate 18, SpO2 99 %. Physical Exam Vitals reviewed.  Constitutional:      Appearance: Normal appearance. He is obese.  HENT:     Head: Normocephalic and atraumatic.     Right Ear: External ear normal.     Left Ear: External ear normal.     Nose: Nose normal.     Mouth/Throat:     Mouth: Mucous membranes are dry.     Pharynx: Oropharynx is clear.  Eyes:     General: No scleral  icterus.    Extraocular Movements: Extraocular movements intact.     Conjunctiva/sclera:  Conjunctivae normal.  Cardiovascular:     Rate and Rhythm: Rhythm irregular.     Pulses: Normal pulses.     Heart sounds: Normal heart sounds.  Pulmonary:     Effort: Pulmonary effort is normal.     Breath sounds: Normal breath sounds. No wheezing or rales.  Abdominal:     General: Abdomen is flat. Bowel sounds are normal.     Palpations: Abdomen is soft.     Tenderness: There is no abdominal tenderness. There is no guarding.  Musculoskeletal:     Cervical back: Neck supple.     Right lower leg: Edema present.     Left lower leg: No edema.     Comments: Trace-1+ right ankle edema with improving erythema  Lymphadenopathy:     Cervical: No cervical adenopathy.  Neurological:     Mental Status: He is alert and oriented to person, place, and time. Mental status is at baseline.     Comments: With delayed responses to questions and repetitive answering.  Problems with recall noted     Assessment/Plan: 1.  Hyponatremia: True hypoosmolar hyponatremia.  He appears to be euvolemic to possibly slightly hypervolemic on physical exam (based on asymmetric right lower extremity edema which is likely from cellulitis).  Available lab data shows inappropriate ADH release with inappropriately high urine osmolality.  Concern is raised with his low random a.m. cortisol that may represent adrenal insufficiency especially with his history of dizziness/falls and "soft" blood pressures documented.  I will order for a cosyntropin stimulation test tomorrow morning to help further evaluate for adrenal insufficiency.  Until then, continue oral sodium chloride supplement with fluid restriction.  If does not have adrenal insufficiency, would favor 1 or 2 doses of tolvaptan to help improve sodium over furosemide to limit impact on blood pressure.  At this time, he does not have indication for hypertonic saline.  Will send off for  uric acid level which may have some limited utility in helping distinguish cerebral salt wasting from SIADH.  Drug associated SIADH remains a differential especially with ongoing Dilantin use. 2.  Parkinsonian syndrome: With prior history of orthostatic hypotension and falls.  Status post vagus nerve stimulator and will check for adrenal insufficiency. 3.  Chronic atrial fibrillation: Appears to be rate controlled and hemodynamically stable with ongoing anticoagulation on Coumadin. 4.  Seizure disorder: On antiseizure medications including Vimpat, levetiracetam and Dilantin.  Attempting to treat hyponatremia to limit lowering seizure threshold. 5.  Right lower extremity cellulitis: Status post intravenous ceftriaxone and now continuing cephalexin.  Burdette Forehand K. 01/24/2022, 1:06 PM

## 2022-01-24 NOTE — Progress Notes (Signed)
ANTICOAGULATION CONSULT NOTE - Follow Up Consult  Pharmacy Consult for Warfarin Indication: atrial fibrillation  Allergies  Allergen Reactions   Depakote [Divalproex Sodium] Other (See Comments)    Arthralgias     Patient Measurements: Height: '5\' 5"'$  (165.1 cm) Weight: 77.1 kg (169 lb 15.6 oz) IBW/kg (Calculated) : 61.5  Vital Signs: Temp: 98 F (36.7 C) (08/10 1200) Temp Source: Oral (08/10 1200) BP: 115/58 (08/10 1200) Pulse Rate: 63 (08/10 1200)  Labs: Recent Labs    01/22/22 0456 01/23/22 0533 01/24/22 0352  LABPROT 26.2* 25.0* 23.0*  INR 2.4* 2.3* 2.1*  CREATININE 0.61 0.57* 0.53*    Estimated Creatinine Clearance: 65.8 mL/min (A) (by C-G formula based on SCr of 0.53 mg/dL (L)).  Assessment: 86 yo male with chronic afib on PTA warfarin, HLD, hx of seizures, parkinson's disease, and BPH admitted for hyponatremia and cellulitis of left leg. Pharmacy consulted to dose warfarin.  INR has trended down the past few days, but remains therapeutic at 2.1. On PTA regimen of 2.5 mg daily, except 2 mg on Wednesdays. 2 mg given 8/9. CBC stable. Last check 8/7.   Goal of Therapy:  INR 2-3 Monitor platelets by anticoagulation protocol: Yes   Plan:  Continue warfarin 2.5 mg daily, except 2 mg on Wednesdays. 2.5 mg due today. Daily PT/INR for now. Intermittent CBC. Monitor for signs/symptoms of bleeding.  Arty Baumgartner, RPh 01/24/2022,1:32 PM

## 2022-01-25 DIAGNOSIS — E871 Hypo-osmolality and hyponatremia: Secondary | ICD-10-CM | POA: Diagnosis not present

## 2022-01-25 LAB — BASIC METABOLIC PANEL
Anion gap: 7 (ref 5–15)
BUN: 9 mg/dL (ref 8–23)
CO2: 21 mmol/L — ABNORMAL LOW (ref 22–32)
Calcium: 8.5 mg/dL — ABNORMAL LOW (ref 8.9–10.3)
Chloride: 95 mmol/L — ABNORMAL LOW (ref 98–111)
Creatinine, Ser: 0.6 mg/dL — ABNORMAL LOW (ref 0.61–1.24)
GFR, Estimated: >60 mL/min (ref >60)
Glucose, Bld: 95 mg/dL (ref 70–99)
Potassium: 4.5 mmol/L (ref 3.5–5.1)
Sodium: 123 mmol/L — ABNORMAL LOW (ref 135–145)

## 2022-01-25 LAB — PROTIME-INR
INR: 2.1 — ABNORMAL HIGH (ref 0.8–1.2)
Prothrombin Time: 23.1 seconds — ABNORMAL HIGH (ref 11.4–15.2)

## 2022-01-25 MED ORDER — COSYNTROPIN 0.25 MG IJ SOLR
0.2500 mg | Freq: Once | INTRAMUSCULAR | Status: AC
  Administered 2022-01-26: 0.25 mg via INTRAVENOUS
  Filled 2022-01-25: qty 0.25

## 2022-01-25 MED ORDER — ORAL CARE MOUTH RINSE: 15.0000 mL | OROMUCOSAL | Status: DC | PRN

## 2022-01-25 NOTE — TOC Progression Note (Signed)
Transition of Care [facility]) - Progression Note    Patient Details  Name: Cameron Potts MRN: [id number] Date of Birth: [birth year]  Transition of Care [facility]) CM/SW [place], [place] Phone Number: 01/25/2022, 9:22 AM  Clinical Narrative:    CSW updated [place] at [place] that patient is not medically stable for discharge yet.    Expected Discharge Plan: Assisted Living Barriers to Discharge: Continued Medical Work up  Expected Discharge Plan and Services Expected Discharge Plan: Assisted Living In-house Referral: Clinical Social Work   Post Acute Care Choice: [place] arrangements for the past 2 months: [place]                                       Social Determinants of Health (SDOH) Interventions    Readmission Risk Interventions     No data to display

## 2022-01-25 NOTE — Progress Notes (Signed)
Physical Therapy Treatment Patient Details Name: Cameron Potts MRN: [id number] DOB: 03-24-1936 Today's Date: 01/25/2022   History of Present Illness 86 y.o. male presenting to ED 01/19/22 with complaints of abnormal lab. +hyponatremia; cellulitis LLE; PMH significant of atrial fibrillation on coumadin, HLD, hx of seizures, PD, BPH and hyponatremia.    PT Comments    Pt received in recliner, agreeable to therapy session and with good participation and tolerance for transfer and gait training in room. Pt with good participation in seated/standing exercises, needing multiple reminders to stop after repetitions completed. Pt performed standing and gait trial with RW ~70f at bedside but needing consistent modA lift assist due to increased posterior lean this date. Pt continues to benefit from PT services to progress toward functional mobility goals.   Recommendations for follow up therapy are one component of a multi-disciplinary discharge planning process, led by the attending physician.  Recommendations may be updated based on patient status, additional functional criteria and insurance authorization.  Follow Up Recommendations  Home health PT     Assistance Recommended at Discharge Intermittent Supervision/Assistance  Patient can return home with the following A little help with walking and/or transfers;A little help with bathing/dressing/bathroom;Assistance with cooking/housework;Direct supervision/assist for medications management;Direct supervision/assist for financial management;Assist for transportation;Help with stairs or ramp for entrance   Equipment Recommendations  None recommended by PT    Recommendations for Other Services       Precautions / Restrictions Precautions Precautions: Fall Precaution Comments: h/o orthostasis with falls (TED hose donned) Restrictions Weight Bearing Restrictions: No     Mobility  Bed Mobility               General bed mobility  comments: pt received in recliner    Transfers Overall transfer level: Needs assistance Equipment used: Rolling walker (2 wheels) Transfers: Sit to/from Stand, Bed to chair/wheelchair/BSC Sit to Stand: Min assist, Mod assist           General transfer comment: from recliner, initally needing modA due to posterior lean despite multimodal cues for anterior weight shifting. Pt with extended pause from lifting hips off chair prior to reaching up to RW and needing heavier modA anterior weight shift and lift assist today, even with reciprocal practice    Ambulation/Gait Ambulation/Gait assistance: Min assist, Mod assist Gait Distance (Feet): 15 Feet Assistive device: Rolling walker (2 wheels) Gait Pattern/deviations: Step-to pattern, Narrow base of support, Knee flexed in stance - left, Knee flexed in stance - right, Leaning posteriorly, Trunk flexed     Pre-gait activities: standing hip flexion x10 reps ea at RW General Gait Details: posterior bias throughout; does better with cues for BIG steps/movements and visual cue for posture ("look up at ___ thing on the wall"), no dizziness reported but BP did decrease initially upon standing, improved after pre-gait exercises.       Balance Overall balance assessment: Needs assistance Sitting-balance support: Feet supported, Bilateral upper extremity supported Sitting balance-Leahy Scale: Fair Sitting balance - Comments: does not tolerate dynamic challenge Postural control: Posterior lean Standing balance support: Reliant on assistive device for balance Standing balance-Leahy Scale: Poor Standing balance comment: needs consistent external assist during gait and pre-gait tasks at bedside-posterior lean                            Cognition Arousal/Alertness: Awake/alert Behavior During Therapy: Flat affect Overall Cognitive Status: Impaired/Different from baseline Area of Impairment: Problem solving  Problem Solving: Slow processing, Decreased initiation, Difficulty sequencing, Requires verbal cues, Requires tactile cues General Comments: Slow to respond but able to answer questions appropriately. Pt slow movements and with difficulty multitasking and sequencing movements per cues (hx PD)        Exercises Other Exercises Other Exercises: seated BLE AROM: LAQ, ankle pumps, hip flexion x20 reps ea (pt did more than 20 at times) Other Exercises: standing BLE AROM: hip flexion x15 reps ea Other Exercises: STS x 3 reps with breaks between Other Exercises: reclined BLE AROM: SLR, hip ab/adduction x20 reps ea    General Comments General comments (skin integrity, edema, etc.): BP 143/72 reclined in chair, then 119/101 standing, then 142/102 standing after 4 mins      Pertinent Vitals/Pain Pain Assessment Pain Assessment: No/denies pain Pain Intervention(s): Monitored during session, Repositioned           PT Goals (current goals can now be found in the care plan section) Acute Rehab PT Goals Patient Stated Goal: return to ALF PT Goal Formulation: With patient Time For Goal Achievement: 02/04/22 Progress towards PT goals: Progressing toward goals    Frequency    Min 3X/week      PT Plan Current plan remains appropriate       AM-PAC PT "6 Clicks" Mobility   Outcome Measure  Help needed turning from your back to your side while in a flat bed without using bedrails?: A Little Help needed moving from lying on your back to sitting on the side of a flat bed without using bedrails?: A Little Help needed moving to and from a bed to a chair (including a wheelchair)?: A Lot Help needed standing up from a chair using your arms (e.g., wheelchair or bedside chair)?: A Lot Help needed to walk in hospital room?: A Lot Help needed climbing 3-5 steps with a railing? : Total 6 Click Score: 13    End of Session Equipment Utilized During Treatment: Gait belt Activity  Tolerance: Patient tolerated treatment well Patient left: with call bell/phone within reach;Other (comment);in chair;with chair alarm set (pt would benefit from geomat cushion) Nurse Communication: Mobility status;Other (comment) (Pt still orthostatic but better than previously) PT Visit Diagnosis: Other abnormalities of gait and mobility (R26.89);Other symptoms and signs involving the nervous system (R29.898)     Time: 4854-6270 PT Time Calculation (min) (ACUTE ONLY): 25 min  Charges:  $Gait Training: 8-22 mins $Therapeutic Exercise: 8-22 mins                     Arhianna Ebey P., PTA Acute Rehabilitation Services Secure Chat Preferred 9a-5:30pm Office: [place] 01/25/2022, 3:34 PM

## 2022-01-25 NOTE — Progress Notes (Signed)
ANTICOAGULATION CONSULT NOTE - Follow Up Consult  Pharmacy Consult for Warfarin Indication: atrial fibrillation  Allergies  Allergen Reactions   Depakote [Divalproex Sodium] Other (See Comments)    Arthralgias     Patient Measurements: Height: '5\' 5"'$  (165.1 cm) Weight: 77.1 kg (169 lb 15.6 oz) IBW/kg (Calculated) : 61.5  Vital Signs: Temp: 97.4 F (36.3 C) (08/11 0842) Temp Source: Oral (08/11 0842) BP: 165/62 (08/11 0842) Pulse Rate: 60 (08/11 0842)  Labs: Recent Labs    01/23/22 0533 01/24/22 0352 01/25/22 0647  LABPROT 25.0* 23.0* 23.1*  INR 2.3* 2.1* 2.1*  CREATININE 0.57* 0.53* 0.60*     Estimated Creatinine Clearance: 65.8 mL/min (A) (by C-G formula based on SCr of 0.6 mg/dL (L)).  Assessment: 86 yo male with chronic afib on PTA warfarin, HLD, hx of seizures, parkinson's disease, and BPH admitted for hyponatremia and cellulitis of left leg. Pharmacy consulted to dose warfarin.  INR has trended down the past several days, but remains therapeutic at 2.1 x 2. On PTA regimen of 2.5 mg daily, except 2 mg on Wednesdays. 2 mg given 8/9. CBC stable. Last check 8/7.   Goal of Therapy:  INR 2-3 Monitor platelets by anticoagulation protocol: Yes   Plan:  Continue warfarin 2.5 mg daily, except 2 mg on Wednesdays. 2.5 mg due today. Daily PT/INR for now. Intermittent CBC. Monitor for signs/symptoms of bleeding.  Arty Baumgartner, RPh 01/25/2022,10:10 AM

## 2022-01-25 NOTE — Progress Notes (Signed)
Cosyntropin given before blood drawn. Spoke with lab, test will be reordered for tomorrow. MD notified.

## 2022-01-25 NOTE — Progress Notes (Signed)
PROGRESS NOTE    Cameron Potts  NKN:[id number] DOB: March 08, 1936 DOA: 01/19/2022 PCP: [name], MD   Brief Narrative:  Cameron Potts is a 86 y.o. male with medical history significant of atrial fibrillation on coumadin, HLD, hx of seizures, PD, BPH and hyponatremia presenting to ED with complaints of abnormal lab. He had labs checked at his Pcp and sodium was trending downwards. He had labs done yesterday and sodium was 125 so advised to come to Ed. He was seen in ED a few days ago for a fall, but states he falls from time to time with his PD. He states he hasn't felt as good as he normally does since early today. He denies feeling more tired than normal. He denies any confusion.   Assessment & Plan:   Principal Problem:   Hyponatremia Active Problems:   Cellulitis of left lower leg   Thyroid nodule   Seizure disorder (HCC)   Chronic atrial fibrillation   Parkinsonian syndrome associated with symptomatic orthostatic hypotension (HCC)   Benign prostatic hyperplasia with urinary frequency   Acute hyponatremia   Hyponatremia, acute on chronic, POA Chronic cerebral salt wasting syndrome, POA Rule out secondary causes of salt loss Long history of hyponatremia dating back to 2012. Average low 130s - acutely drops during infection (last pneumonia - as low as 124) Taking 1g salt tablets BID chronically --> increase to 2g TID and follow clinically Status post 500 cc normal saline bolus with improving sodium Denies any excess free water intake or exogenous losses, appears euvolemic Stabilizing 129>125>123>122>123>122>124>123 Nephrology asked to evaluated - concern for adrenal insufficiency vs drug induced SIADH - ACTH stimulation test pending - poor timing of injection this morning - will need to repeat testing in the am Keppra possibly contributing as above Fluid restriction 1500cc Follow morning labs   Cellulitis of left lower leg, acute, resolved He has no  leukocytosis, elevated HR/RR Mild disease, continue keflex for 10 day course Possibly contributing to above hyponatremia given history of profound hyponatremia with prior infections   Thyroid nodule Incidental finding on CT chest. 2.2x1.6cm Thyroid US - cystic in nature - no indication for further workup/imaging/biopsy at this time.   Seizure disorder [facility]) Seizure precautions  Continue AED: vimpat, keppra, dilantin    Chronic atrial fibrillation Continue flecainide and warfarin Pharmacy to dose coumadin - follow INR  INR erroneously elevated 8/7 - improved without intervention   Parkinsonian syndrome associated with symptomatic orthostatic hypotension (HCC) Hx of PD and orthostasis with falls Fall precautions   Constipation Miralax/sennakot pending - follow clinically  Benign prostatic hyperplasia with urinary frequency Continue proscar and flomax    DVT prophylaxis: Warfarin Code Status: DNR Family Communication: Wife at bedside  Status is: Inpatient  Dispo: The patient is from: Assisted living              Anticipated d/c is to: TBD              Anticipated d/c date is: 24 to 48 hours              Patient currently NOT medically stable for discharge  Consultants:  None  Procedures:  None  Antimicrobials:  Keflex   Subjective: No acute issues/events overnight, mental status minimally improving but not yet back to baseline per wife at bedside -overnight ACTH test timed poorly, will need to be repeated  Objective: Vitals:   01/24/22 1300 01/24/22 2003 01/24/22 2339 01/25/22 0349  BP:  Marland Kitchen)  153/64 130/68 139/61  Pulse:  (!) 59 60 (!) 56  Resp:  '18 18 18  '$ Temp:  98.2 F (36.8 C) 97.8 F (36.6 C) 97.8 F (36.6 C)  TempSrc:  Oral Oral Oral  SpO2:      Weight: 77.1 kg     Height: '5\' 5"'$  (1.651 m)       Intake/Output Summary (Last 24 hours) at 01/25/2022 0829 Last data filed at 01/25/2022 0349 Gross per 24 hour  Intake --  Output 1000 ml  Net -1000 ml     Filed Weights   01/24/22 1300  Weight: 77.1 kg    Examination:  General exam: Appears calm and comfortable  Respiratory system: Clear to auscultation. Respiratory effort normal. Cardiovascular system: S1 & S2 heard, RRR. No JVD, murmurs, rubs, gallops or clicks. No pedal edema. Gastrointestinal system: Abdomen is nondistended, soft and nontender. No organomegaly or masses felt. Normal bowel sounds heard. Central nervous system: Alert and oriented. No focal neurological deficits. Extremities: Symmetric 5 x 5 power. Skin: Blanching erythema LLE anteriorly - improving  Data Reviewed: I have personally reviewed following labs and imaging studies  CBC: Recent Labs  Lab 01/19/22 1139 01/19/22 1143 01/20/22 0250 01/21/22 0844  WBC 5.3  --  4.1 4.3  NEUTROABS 2.4  --   --   --   HGB 11.6* 11.2* 11.7* 11.6*  HCT 32.2* 33.0* 31.7* 31.5*  MCV 90.4  --  91.9 88.5  PLT 234  --  213 284    Basic Metabolic Panel: Recent Labs  Lab 01/21/22 0844 01/22/22 0456 01/23/22 0533 01/24/22 0352 01/25/22 0647  NA 123* 122* 124* 123* 123*  K 4.1 4.4 4.2 4.4 4.5  CL 94* 94* 94* 95* 95*  CO2 19* 20* 22 20* 21*  GLUCOSE 91 90 92 96 95  BUN 7* '9 8 8 9  '$ CREATININE 0.62 0.61 0.57* 0.53* 0.60*  CALCIUM 8.7* 8.2* 8.7* 8.3* 8.5*    GFR: Estimated Creatinine Clearance: 65.8 mL/min (A) (by C-G formula based on SCr of 0.6 mg/dL (L)). Liver Function Tests: No results for input(s): "AST", "ALT", "ALKPHOS", "BILITOT", "PROT", "ALBUMIN" in the last 168 hours. No results for input(s): "LIPASE", "AMYLASE" in the last 168 hours. No results for input(s): "AMMONIA" in the last 168 hours. Coagulation Profile: Recent Labs  Lab 01/21/22 1230 01/22/22 0456 01/23/22 0533 01/24/22 0352 01/25/22 0647  INR 2.7* 2.4* 2.3* 2.1* 2.1*    Cardiac Enzymes: No results for input(s): "CKTOTAL", "CKMB", "CKMBINDEX", "TROPONINI" in the last 168 hours. BNP (last 3 results) No results for input(s): "PROBNP"  in the last 8760 hours. HbA1C: No results for input(s): "HGBA1C" in the last 72 hours. CBG: No results for input(s): "GLUCAP" in the last 168 hours.  Lipid Profile: No results for input(s): "CHOL", "HDL", "LDLCALC", "TRIG", "CHOLHDL", "LDLDIRECT" in the last 72 hours. Thyroid Function Tests: No results for input(s): "TSH", "T4TOTAL", "FREET4", "T3FREE", "THYROIDAB" in the last 72 hours.  Anemia Panel: No results for input(s): "VITAMINB12", "FOLATE", "FERRITIN", "TIBC", "IRON", "RETICCTPCT" in the last 72 hours. Sepsis Labs: No results for input(s): "PROCALCITON", "LATICACIDVEN" in the last 168 hours.  No results found for this or any previous visit (from the past 240 hour(s)).       Radiology Studies: No results found.      Scheduled Meds:  ALPRAZolam  0.25 mg Oral BID   brimonidine  1 drop Both Eyes BID   cephALEXin  500 mg Oral TID   [START ON 01/26/2022] cosyntropin  0.25 mg Intravenous Once   finasteride  5 mg Oral q morning   flecainide  50 mg Oral BID   lacosamide  200 mg Oral BID   latanoprost  1 drop Both Eyes QHS   levETIRAcetam  2,250 mg Oral Daily   phenytoin  75 mg Oral Daily   phenytoin  300 mg Oral QHS   polyethylene glycol  17 g Oral Daily   sodium chloride flush  3 mL Intravenous Q12H   sodium chloride  2 g Oral TID WC   tamsulosin  0.4 mg Oral Daily   timolol  1 drop Both Eyes BID   warfarin  2.5 mg Oral Once per day on Sun Mon Tue Thu Fri Sat   And   warfarin  2 mg Oral Once per day on Wed   Warfarin - Pharmacist Dosing Inpatient   Does not apply q1600   Continuous Infusions:  sodium chloride       LOS: 5 days   Time spent: 76mn  Parissa Chiao C Denisha Hoel, DO [facility]  If 7PM-7AM, please contact night-coverage [email or web address]  01/25/2022, 8:29 AM

## 2022-01-25 NOTE — Progress Notes (Signed)
Patient ID: Cameron Potts, male   DOB: [birth year], 86 y.o.   MRN: [id number] S: No new complaints from Cameron Potts.  Unfortunately, he was given cosyntropin before they drew a baseline cortisol this morning so will be repeated tomorrow.  O:BP (!) 165/62 (BP Location: Right Arm)   Pulse 60   Temp (!) 97.4 F (36.3 C) (Oral)   Resp 17   Ht '5\' 5"'$  (1.651 m)   Wt 77.1 kg   SpO2 99%   BMI 28.29 kg/m   Intake/Output Summary (Last 24 hours) at 01/25/2022 1208 Last data filed at 01/25/2022 1000 Gross per 24 hour  Intake --  Output 1675 ml  Net -1675 ml   Intake/Output: I/O last 3 completed shifts: In: -  Out: 2000 [Urine:2000]  Intake/Output this shift:  Total I/O In: -  Out: 675 [Urine:675] Weight change:  Gen:NAD CVS: RRR Resp:CTA Abd: +BS, soft, NT/ND Ext:1+ pretibial edema on right, none on left.  Recent Labs  Lab 01/20/22 0250 01/20/22 1456 01/21/22 0844 01/22/22 0456 01/23/22 0533 01/24/22 0352 01/25/22 0647  NA 122* 123* 123* 122* 124* 123* 123*  K 4.1 4.2 4.1 4.4 4.2 4.4 4.5  CL 92* 94* 94* 94* 94* 95* 95*  CO2 20* 21* 19* 20* 22 20* 21*  GLUCOSE 97 123* 91 90 92 96 95  BUN 7* 10 7* '9 8 8 9  '$ CREATININE 0.60* 0.74 0.62 0.61 0.57* 0.53* 0.60*  CALCIUM 8.3* 8.4* 8.7* 8.2* 8.7* 8.3* 8.5*   Liver Function Tests: No results for input(s): "AST", "ALT", "ALKPHOS", "BILITOT", "PROT", "ALBUMIN" in the last 168 hours. No results for input(s): "LIPASE", "AMYLASE" in the last 168 hours. No results for input(s): "AMMONIA" in the last 168 hours. CBC: Recent Labs  Lab 01/19/22 1139 01/19/22 1143 01/20/22 0250 01/21/22 0844  WBC 5.3  --  4.1 4.3  NEUTROABS 2.4  --   --   --   HGB 11.6* 11.2* 11.7* 11.6*  HCT 32.2* 33.0* 31.7* 31.5*  MCV 90.4  --  91.9 88.5  PLT 234  --  213 236   Cardiac Enzymes: No results for input(s): "CKTOTAL", "CKMB", "CKMBINDEX", "TROPONINI" in the last 168 hours. CBG: No results for input(s): "GLUCAP" in the last 168 hours.  Iron  Studies: No results for input(s): "IRON", "TIBC", "TRANSFERRIN", "FERRITIN" in the last 72 hours. Studies/Results: No results found.  ALPRAZolam  0.25 mg Oral BID   brimonidine  1 drop Both Eyes BID   cephALEXin  500 mg Oral TID   [START ON 01/26/2022] cosyntropin  0.25 mg Intravenous Once   finasteride  5 mg Oral q morning   flecainide  50 mg Oral BID   lacosamide  200 mg Oral BID   latanoprost  1 drop Both Eyes QHS   levETIRAcetam  2,250 mg Oral Daily   phenytoin  75 mg Oral Daily   phenytoin  300 mg Oral QHS   polyethylene glycol  17 g Oral Daily   sodium chloride flush  3 mL Intravenous Q12H   sodium chloride  2 g Oral TID WC   tamsulosin  0.4 mg Oral Daily   timolol  1 drop Both Eyes BID   warfarin  2.5 mg Oral Once per day on Sun Mon Tue Thu Fri Sat   And   warfarin  2 mg Oral Once per day on Wed   Warfarin - Pharmacist Dosing Inpatient   Does not apply q1600    BMET    Component  Value Date/Time   NA 123 (L) 01/25/2022 0647   NA 134 (A) 11/16/2021 1627   K 4.5 01/25/2022 0647   CL 95 (L) 01/25/2022 0647   CO2 21 (L) 01/25/2022 0647   GLUCOSE 95 01/25/2022 0647   BUN 9 01/25/2022 0647   BUN 14 11/16/2021 1627   CREATININE 0.60 (L) 01/25/2022 0647   CALCIUM 8.5 (L) 01/25/2022 0647   GFRNONAA >60 01/25/2022 0647   GFRAA >90 01/03/2020 0000   CBC    Component Value Date/Time   WBC 4.3 01/21/2022 0844   RBC 3.56 (L) 01/21/2022 0844   HGB 11.6 (L) 01/21/2022 0844   HGB 13.9 08/02/2019 1128   HCT 31.5 (L) 01/21/2022 0844   HCT 38.9 08/02/2019 1128   PLT 236 01/21/2022 0844   PLT 225 08/02/2019 1128   MCV 88.5 01/21/2022 0844   MCV 92 08/02/2019 1128   MCH 32.6 01/21/2022 0844   MCHC 36.8 (H) 01/21/2022 0844   RDW 12.1 01/21/2022 0844   RDW 13.2 08/02/2019 1128   LYMPHSABS 1.5 01/19/2022 1139   LYMPHSABS 1.7 08/02/2019 1128   MONOABS 0.7 01/19/2022 1139   EOSABS 0.6 (H) 01/19/2022 1139   EOSABS 0.6 (H) 08/02/2019 1128   BASOSABS 0.1 01/19/2022 1139    BASOSABS 0.1 08/02/2019 1128     Assessment/Plan:  Acute on chronic hyponatremia - baseline Na 130-133.  Sosm 258, SNa 123, Uosm 436, UNa 142, TSH 2.685, am cortisol 3.6.  He has been taking NaCl tabs for quite some time and is up to 6 grams/day.  Serum Na remains 123-124.  Awaiting cosyntropin stim test but will need to be repeated tomorrow.  He is asymptomatic and agree that if he is not adrenally insufficient, would dose with tolvaptan for 1-2 doses depending upon the response.  Low uric acid level more in favor of SIADH over cerebral salt wasting.   Parkinsonian syndrome - s/p vagus nerve stimulator. Chronic atrial fibrillation - rate controlled.  On coumadin Seizure disorder - on Vimpat, levetiracetam, and dilantin.  Right lower extremity cellulitis - currently on iv ceftriaxone.   Donetta Potts, MD [facility]

## 2022-01-26 DIAGNOSIS — E871 Hypo-osmolality and hyponatremia: Secondary | ICD-10-CM | POA: Diagnosis not present

## 2022-01-26 LAB — BASIC METABOLIC PANEL
Anion gap: 7 (ref 5–15)
BUN: 10 mg/dL (ref 8–23)
CO2: 23 mmol/L (ref 22–32)
Calcium: 8.7 mg/dL — ABNORMAL LOW (ref 8.9–10.3)
Chloride: 99 mmol/L (ref 98–111)
Creatinine, Ser: 0.64 mg/dL (ref 0.61–1.24)
GFR, Estimated: >60 mL/min (ref >60)
Glucose, Bld: 100 mg/dL — ABNORMAL HIGH (ref 70–99)
Potassium: 4.5 mmol/L (ref 3.5–5.1)
Sodium: 129 mmol/L — ABNORMAL LOW (ref 135–145)

## 2022-01-26 LAB — CORTISOL-AM, BLOOD: Cortisol - AM: 8.6 ug/dL (ref 6.7–22.6)

## 2022-01-26 LAB — ACTH STIMULATION, 3 TIME POINTS
Cortisol, 30 Min: 9.6 ug/dL
Cortisol, 60 Min: 8.5 ug/dL
Cortisol, Base: 20.5 ug/dL

## 2022-01-26 NOTE — Progress Notes (Signed)
Physical Therapy Treatment Patient Details Name: Cameron Potts MRN: [id number] DOB: [birth year] Today's Date: 01/26/2022   History of Present Illness 86 y.o. male presenting to ED 01/19/22 with complaints of abnormal lab. +hyponatremia; cellulitis LLE; PMH significant of atrial fibrillation on coumadin, HLD, hx of seizures, PD, BPH and hyponatremia.    PT Comments    Pt made significant improvements with gait to the fall end of the hallway with RW and participated in seated and standing exercises.  Wife is hopeful for him to d/c soon and requested PT.  PT will continue to follow acutely for safe mobility progression.  Recommendations for follow up therapy are one component of a multi-disciplinary discharge planning process, led by the attending physician.  Recommendations may be updated based on patient status, additional functional criteria and insurance authorization.  Follow Up Recommendations  Home health PT     Assistance Recommended at Discharge Intermittent Supervision/Assistance  Patient can return home with the following A little help with walking and/or transfers;A little help with bathing/dressing/bathroom;Assistance with cooking/housework;Direct supervision/assist for medications management;Direct supervision/assist for financial management;Assist for transportation;Help with stairs or ramp for entrance   Equipment Recommendations  None recommended by PT    Recommendations for Other Services       Precautions / Restrictions Precautions Precautions: Fall Precaution Comments: h/o orthostasis with falls (TED hose donned)     Mobility  Bed Mobility               General bed mobility comments: Pt OOB in the recliner chair.    Transfers Overall transfer level: Needs assistance Equipment used: Rolling walker (2 wheels) Transfers: Sit to/from Stand Sit to Stand: Min assist, +2 safety/equipment           General transfer comment: Two person min assist to  stand from recliner chair, cues for safe hand placement, momentum needed to get up    Ambulation/Gait Ambulation/Gait assistance: Min assist, +2 safety/equipment Gait Distance (Feet): 150 Feet Assistive device: Rolling walker (2 wheels) Gait Pattern/deviations: Step-through pattern, Shuffle, Trunk flexed Gait velocity: decreased Gait velocity interpretation: <1.8 ft/sec, indicate of risk for recurrent falls   General Gait Details: Pt with shuffling gait pattern, self cueing for safety, chair to follow to encourage increased gait distance.   Stairs             Wheelchair Mobility    Modified Rankin (Stroke Patients Only)       Balance Overall balance assessment: Needs assistance Sitting-balance support: Feet supported, Bilateral upper extremity supported Sitting balance-Leahy Scale: Poor Sitting balance - Comments: reliant on bil UEs to keep back off of recliner chair. Postural control: Posterior lean Standing balance support: Bilateral upper extremity supported Standing balance-Leahy Scale: Poor Standing balance comment: needs bil UE support on RW for balance                            Cognition Arousal/Alertness: Awake/alert Behavior During Therapy: WFL for tasks assessed/performed Overall Cognitive Status: Within Functional Limits for tasks assessed                                 General Comments: Not specifically tested, conversation is normal        Exercises General Exercises - Upper Extremity Shoulder Flexion: AROM, Both, 10 reps Elbow Flexion: AROM, Both, 10 reps General Exercises - Lower Extremity Ankle Circles/Pumps: AROM, Both, 10 reps  Long Arc Quad: AROM, Both, 10 reps Hip ABduction/ADduction: AROM, Both, 10 reps Hip Flexion/Marching: AROM, Both, 10 reps Other Exercises Other Exercises: standing marches x 10 each with RW    General Comments        Pertinent Vitals/Pain Pain Assessment Pain Assessment: No/denies  pain    Home Living Family/patient expects to be discharged to:: Assisted living                        Prior Function            PT Goals (current goals can now be found in the care plan section) Acute Rehab PT Goals Patient Stated Goal: return to ALF Progress towards PT goals: Progressing toward goals    Frequency    Min 3X/week      PT Plan Current plan remains appropriate    Co-evaluation              AM-PAC PT "6 Clicks" Mobility   Outcome Measure  Help needed turning from your back to your side while in a flat bed without using bedrails?: A Little Help needed moving from lying on your back to sitting on the side of a flat bed without using bedrails?: A Little Help needed moving to and from a bed to a chair (including a wheelchair)?: A Lot Help needed standing up from a chair using your arms (e.g., wheelchair or bedside chair)?: A Lot Help needed to walk in hospital room?: A Lot Help needed climbing 3-5 steps with a railing? : Total 6 Click Score: 13    End of Session Equipment Utilized During Treatment: Gait belt Activity Tolerance: Patient tolerated treatment well Patient left: in chair;with call bell/phone within reach;with chair alarm set   PT Visit Diagnosis: Other abnormalities of gait and mobility (R26.89);Other symptoms and signs involving the nervous system (R29.898)     Time: 5498-2641 PT Time Calculation (min) (ACUTE ONLY): 35 min  Charges:  $Gait Training: 8-22 mins $Therapeutic Exercise: 8-22 mins                    Verdene Lennert, PT, DPT  Acute Rehabilitation Secure chat is best for contact #[phone] office

## 2022-01-26 NOTE — Progress Notes (Addendum)
PROGRESS NOTE    Cameron Potts  OVF:[id number] DOB: 19-Nov-1935 DOA: 01/19/2022 PCP: [name], MD   Brief Narrative:  Cameron Potts is a 86 y.o. male with medical history significant of atrial fibrillation on coumadin, HLD, hx of seizures, PD, BPH and hyponatremia presenting to ED with complaints of abnormal lab. He had labs checked at his Pcp and sodium was trending downwards. He had labs done yesterday and sodium was 125 so advised to come to Ed. He was seen in ED a few days ago for a fall, but states he falls from time to time with his PD. He states he hasn't felt as good as he normally does since early today. He denies feeling more tired than normal. He denies any confusion.   Assessment & Plan:   Principal Problem:   Hyponatremia Active Problems:   Cellulitis of left lower leg   Thyroid nodule   Seizure disorder (HCC)   Chronic atrial fibrillation   Parkinsonian syndrome associated with symptomatic orthostatic hypotension (HCC)   Benign prostatic hyperplasia with urinary frequency   Acute hyponatremia   Hyponatremia, acute on chronic, POA Chronic cerebral salt wasting syndrome, POA Rule out secondary causes of salt loss - Long history of hyponatremia dating back to 2012. Average low 130s - acutely drops during infection (last pneumonia - as low as 124) - Taking 1g salt tablets BID chronically --> increase to 2g TID and follow clinically - Denies any excess free water intake or exogenous losses, appears euvolemic -Resolving, sodium now 129 after increased p.o. salt intake - Nephrology asked to evaluated - concern for adrenal insufficiency vs drug induced SIADH - ACTH stimulation test incorrectly done x2, unable to interpret results given timing of lab draws this morning -improving without treatment of adrenal insufficiency making this less likely or at least not a major factor at this time. Keppra possibly contributing as above Fluid restriction 1500cc    Cellulitis of left lower leg, acute, resolved He has no leukocytosis, elevated HR/RR Mild disease, continue keflex for 10 day course -through 01/29/2022 Possibly contributing to above hyponatremia given history of profound acute on chronic hyponatremia with prior infections   Thyroid nodule Incidental finding on CT chest. 2.2x1.6cm Thyroid US - cystic in nature - no indication for further workup/imaging/biopsy at this time.   Seizure disorder [facility]) Seizure precautions  Continue AED: vimpat, keppra, dilantin    Chronic atrial fibrillation Continue flecainide and warfarin Pharmacy to dose coumadin - follow INR  INR erroneously elevated 8/7 - improved without intervention   Parkinsonian syndrome associated with symptomatic orthostatic hypotension (HCC) Hx of PD and orthostasis with falls Fall precautions   Constipation Miralax/sennakot pending - follow clinically  Benign prostatic hyperplasia with urinary frequency Continue proscar and flomax    DVT prophylaxis: Warfarin Code Status: DNR Family Communication: Wife at bedside  Status is: Inpatient  Dispo: The patient is from: Assisted living              Anticipated d/c is to: Same              Anticipated d/c date is: 24 hours pending sodium levels              Patient currently NOT medically stable for discharge  Consultants:  Nephrology  Procedures:  None  Antimicrobials:  Keflex   Subjective: No acute issues/events overnight, mental status essentially back to baseline per wife at bedside this morning, ACTH test performed incorrectly, improving sodium levels without  treatment for presumed concurrent adrenal insufficiency.  Likely dispo back to facility tomorrow pending repeat sodium levels  Objective: Vitals:   01/25/22 1244 01/25/22 1809 01/26/22 0000 01/26/22 0400  BP: (!) 148/68 (!) 152/64 (!) 153/61 (!) 147/66  Pulse: (!) 59  60 60  Resp: 20     Temp: 97.8 F (36.6 C)  97.9 F (36.6 C) 97.9 F (36.6 C)   TempSrc: Oral  Oral Oral  SpO2:   99% 98%  Weight:      Height:        Intake/Output Summary (Last 24 hours) at 01/26/2022 0814 Last data filed at 01/26/2022 0600 Gross per 24 hour  Intake 480 ml  Output 2300 ml  Net -1820 ml    Filed Weights   01/24/22 1300  Weight: 77.1 kg    Examination:  General exam: Appears calm and comfortable  Respiratory system: Clear to auscultation. Respiratory effort normal. Cardiovascular system: S1 & S2 heard, RRR. No JVD, murmurs, rubs, gallops or clicks. No pedal edema. Gastrointestinal system: Abdomen is nondistended, soft and nontender. No organomegaly or masses felt. Normal bowel sounds heard. Central nervous system: Alert and oriented. No focal neurological deficits. Extremities: Symmetric 5 x 5 power. Skin: Blanching erythema LLE anteriorly - improving  Data Reviewed: I have personally reviewed following labs and imaging studies  CBC: Recent Labs  Lab 01/19/22 1139 01/19/22 1143 01/20/22 0250 01/21/22 0844  WBC 5.3  --  4.1 4.3  NEUTROABS 2.4  --   --   --   HGB 11.6* 11.2* 11.7* 11.6*  HCT 32.2* 33.0* 31.7* 31.5*  MCV 90.4  --  91.9 88.5  PLT 234  --  213 119    Basic Metabolic Panel: Recent Labs  Lab 01/22/22 0456 01/23/22 0533 01/24/22 0352 01/25/22 0647 01/26/22 0547  NA 122* 124* 123* 123* 129*  K 4.4 4.2 4.4 4.5 4.5  CL 94* 94* 95* 95* 99  CO2 20* 22 20* 21* 23  GLUCOSE 90 92 96 95 100*  BUN '9 8 8 9 10  '$ CREATININE 0.61 0.57* 0.53* 0.60* 0.64  CALCIUM 8.2* 8.7* 8.3* 8.5* 8.7*    GFR: Estimated Creatinine Clearance: 65.8 mL/min (by C-G formula based on SCr of 0.64 mg/dL). Liver Function Tests: No results for input(s): "AST", "ALT", "ALKPHOS", "BILITOT", "PROT", "ALBUMIN" in the last 168 hours. No results for input(s): "LIPASE", "AMYLASE" in the last 168 hours. No results for input(s): "AMMONIA" in the last 168 hours. Coagulation Profile: Recent Labs  Lab 01/21/22 1230 01/22/22 0456 01/23/22 0533  01/24/22 0352 01/25/22 0647  INR 2.7* 2.4* 2.3* 2.1* 2.1*    Cardiac Enzymes: No results for input(s): "CKTOTAL", "CKMB", "CKMBINDEX", "TROPONINI" in the last 168 hours. BNP (last 3 results) No results for input(s): "PROBNP" in the last 8760 hours. HbA1C: No results for input(s): "HGBA1C" in the last 72 hours. CBG: No results for input(s): "GLUCAP" in the last 168 hours.  Lipid Profile: No results for input(s): "CHOL", "HDL", "LDLCALC", "TRIG", "CHOLHDL", "LDLDIRECT" in the last 72 hours. Thyroid Function Tests: No results for input(s): "TSH", "T4TOTAL", "FREET4", "T3FREE", "THYROIDAB" in the last 72 hours.  Anemia Panel: No results for input(s): "VITAMINB12", "FOLATE", "FERRITIN", "TIBC", "IRON", "RETICCTPCT" in the last 72 hours. Sepsis Labs: No results for input(s): "PROCALCITON", "LATICACIDVEN" in the last 168 hours.  No results found for this or any previous visit (from the past 240 hour(s)).       Radiology Studies: No results found.      Scheduled Meds:  ALPRAZolam  0.25 mg Oral BID   brimonidine  1 drop Both Eyes BID   cephALEXin  500 mg Oral TID   finasteride  5 mg Oral q morning   flecainide  50 mg Oral BID   lacosamide  200 mg Oral BID   latanoprost  1 drop Both Eyes QHS   levETIRAcetam  2,250 mg Oral Daily   phenytoin  75 mg Oral Daily   phenytoin  300 mg Oral QHS   polyethylene glycol  17 g Oral Daily   sodium chloride flush  3 mL Intravenous Q12H   sodium chloride  2 g Oral TID WC   tamsulosin  0.4 mg Oral Daily   timolol  1 drop Both Eyes BID   warfarin  2.5 mg Oral Once per day on Sun Mon Tue Thu Fri Sat   And   warfarin  2 mg Oral Once per day on Wed   Warfarin - Pharmacist Dosing Inpatient   Does not apply q1600   Continuous Infusions:  sodium chloride       LOS: 6 days   Time spent: 55mn  Cameron Hentges C Kassy Mcenroe, DO [facility]  If 7PM-7AM, please contact night-coverage [email or web address]  01/26/2022, 8:14 AM

## 2022-01-26 NOTE — Progress Notes (Signed)
Patient ID: Cameron Potts, male   DOB: 05/12/1936, 86 y.o.   MRN: [id number] S:Feeling better today. O:BP (!) 165/75 (BP Location: Left Arm)   Pulse 60   Temp (!) 97.5 F (36.4 C) (Oral)   Resp 17   Ht '5\' 5"'$  (1.651 m)   Wt 77.1 kg   SpO2 98%   BMI 28.29 kg/m   Intake/Output Summary (Last 24 hours) at 01/26/2022 1100 Last data filed at 01/26/2022 0600 Gross per 24 hour  Intake 240 ml  Output 1625 ml  Net -1385 ml   Intake/Output: I/O last 3 completed shifts: In: 480 [P.O.:480] Out: 3300 [Urine:3300]  Intake/Output this shift:  No intake/output data recorded. Weight change:  Gen:NAD CVS: RRR Resp:CTA Abd: +BS, soft, NT/ND Ext: trace edema of RLE  Recent Labs  Lab 01/20/22 1456 01/21/22 0844 01/22/22 0456 01/23/22 0533 01/24/22 0352 01/25/22 0647 01/26/22 0547  NA 123* 123* 122* 124* 123* 123* 129*  K 4.2 4.1 4.4 4.2 4.4 4.5 4.5  CL 94* 94* 94* 94* 95* 95* 99  CO2 21* 19* 20* 22 20* 21* 23  GLUCOSE 123* 91 90 92 96 95 100*  BUN 10 7* '9 8 8 9 10  '$ CREATININE 0.74 0.62 0.61 0.57* 0.53* 0.60* 0.64  CALCIUM 8.4* 8.7* 8.2* 8.7* 8.3* 8.5* 8.7*   Liver Function Tests: No results for input(s): "AST", "ALT", "ALKPHOS", "BILITOT", "PROT", "ALBUMIN" in the last 168 hours. No results for input(s): "LIPASE", "AMYLASE" in the last 168 hours. No results for input(s): "AMMONIA" in the last 168 hours. CBC: Recent Labs  Lab 01/19/22 1139 01/19/22 1143 01/20/22 0250 01/21/22 0844  WBC 5.3  --  4.1 4.3  NEUTROABS 2.4  --   --   --   HGB 11.6* 11.2* 11.7* 11.6*  HCT 32.2* 33.0* 31.7* 31.5*  MCV 90.4  --  91.9 88.5  PLT 234  --  213 236   Cardiac Enzymes: No results for input(s): "CKTOTAL", "CKMB", "CKMBINDEX", "TROPONINI" in the last 168 hours. CBG: No results for input(s): "GLUCAP" in the last 168 hours.  Iron Studies: No results for input(s): "IRON", "TIBC", "TRANSFERRIN", "FERRITIN" in the last 72 hours. Studies/Results: No results found.  ALPRAZolam  0.25  mg Oral BID   brimonidine  1 drop Both Eyes BID   cephALEXin  500 mg Oral TID   finasteride  5 mg Oral q morning   flecainide  50 mg Oral BID   lacosamide  200 mg Oral BID   latanoprost  1 drop Both Eyes QHS   levETIRAcetam  2,250 mg Oral Daily   phenytoin  75 mg Oral Daily   phenytoin  300 mg Oral QHS   polyethylene glycol  17 g Oral Daily   sodium chloride flush  3 mL Intravenous Q12H   sodium chloride  2 g Oral TID WC   tamsulosin  0.4 mg Oral Daily   timolol  1 drop Both Eyes BID   warfarin  2.5 mg Oral Once per day on Sun Mon Tue Thu Fri Sat   And   warfarin  2 mg Oral Once per day on Wed   Warfarin - Pharmacist Dosing Inpatient   Does not apply q1600    BMET    Component Value Date/Time   NA 129 (L) 01/26/2022 0547   NA 134 (A) 11/16/2021 1627   K 4.5 01/26/2022 0547   CL 99 01/26/2022 0547   CO2 23 01/26/2022 0547   GLUCOSE 100 (H) 01/26/2022 0547  BUN 10 01/26/2022 0547   BUN 14 11/16/2021 1627   CREATININE 0.64 01/26/2022 0547   CALCIUM 8.7 (L) 01/26/2022 0547   GFRNONAA >60 01/26/2022 0547   GFRAA >90 01/03/2020 0000   CBC    Component Value Date/Time   WBC 4.3 01/21/2022 0844   RBC 3.56 (L) 01/21/2022 0844   HGB 11.6 (L) 01/21/2022 0844   HGB 13.9 08/02/2019 1128   HCT 31.5 (L) 01/21/2022 0844   HCT 38.9 08/02/2019 1128   PLT 236 01/21/2022 0844   PLT 225 08/02/2019 1128   MCV 88.5 01/21/2022 0844   MCV 92 08/02/2019 1128   MCH 32.6 01/21/2022 0844   MCHC 36.8 (H) 01/21/2022 0844   RDW 12.1 01/21/2022 0844   RDW 13.2 08/02/2019 1128   LYMPHSABS 1.5 01/19/2022 1139   LYMPHSABS 1.7 08/02/2019 1128   MONOABS 0.7 01/19/2022 1139   EOSABS 0.6 (H) 01/19/2022 1139   EOSABS 0.6 (H) 08/02/2019 1128   BASOSABS 0.1 01/19/2022 1139   BASOSABS 0.1 08/02/2019 1128    Assessment/Plan:   Acute on chronic hyponatremia - baseline Na 130-133.  Sosm 258, SNa 123, Uosm 436, UNa 142, TSH 2.685, am cortisol 3.6.  He has been taking NaCl tabs for quite some time  and is up to 6 grams/day.  Serum Na rose from 123-124 up to 129 this morning.  His wife thinks it is because he ate a can of salted cashews.   I think there was another mistake on the cosyntropin stim test as the base cortisol was 20.5, 30 min 9.6, and 60 min 8.5.  The patient also states that they only drew blood twice this morning.  Will check am cortisol alone tomorrow and if still low, would recommend hydrocortisone or can follow up with his PCP.  If his Na remains 129 or better, should be stable for discharge tomorrow.  He is asymptomatic.  Low uric acid level more in favor of SIADH over cerebral salt wasting.   Parkinsonian syndrome - s/p vagus nerve stimulator. Chronic atrial fibrillation - rate controlled.  On coumadin Seizure disorder - on Vimpat, levetiracetam, and dilantin.  Right lower extremity cellulitis - currently on iv ceftriaxone.   Donetta Potts, MD [facility]

## 2022-01-27 DIAGNOSIS — E871 Hypo-osmolality and hyponatremia: Secondary | ICD-10-CM | POA: Diagnosis not present

## 2022-01-27 LAB — BASIC METABOLIC PANEL
Anion gap: 10 (ref 5–15)
BUN: 17 mg/dL (ref 8–23)
CO2: 17 mmol/L — ABNORMAL LOW (ref 22–32)
Calcium: 7.6 mg/dL — ABNORMAL LOW (ref 8.9–10.3)
Chloride: 104 mmol/L (ref 98–111)
Creatinine, Ser: 0.76 mg/dL (ref 0.61–1.24)
GFR, Estimated: >60 mL/min (ref >60)
Glucose, Bld: 108 mg/dL — ABNORMAL HIGH (ref 70–99)
Potassium: 4.7 mmol/L (ref 3.5–5.1)
Sodium: 131 mmol/L — ABNORMAL LOW (ref 135–145)

## 2022-01-27 LAB — PROTIME-INR
INR: 2.4 — ABNORMAL HIGH (ref 0.8–1.2)
Prothrombin Time: 25.5 seconds — ABNORMAL HIGH (ref 11.4–15.2)

## 2022-01-27 MED ORDER — ALPRAZOLAM 0.25 MG PO TABS: 0.2500 mg | ORAL_TABLET | Freq: Two times a day (BID) | ORAL | 5 refills | Status: DC

## 2022-01-27 MED ORDER — SODIUM CHLORIDE 1 G PO TABS: 2.0000 g | ORAL_TABLET | Freq: Two times a day (BID) | ORAL | Status: DC

## 2022-01-27 NOTE — Discharge Summary (Signed)
Physician Discharge Summary  Jaryd Drew Potts YQI:[id number] DOB: 1935/11/10 DOA: 01/19/2022  PCP: [name], MD  Admit date: 01/19/2022 Discharge date: 01/27/2022  Admitted From: Facility Disposition: Same  Recommendations for Outpatient Follow-up:  Follow up with PCP in 1-2 weeks  Home Health: PT OT Equipment/Devices: None  Discharge Condition: Stable CODE STATUS: Full Diet recommendation: Low-fat diet  Brief/Interim Summary: Cameron Potts is a 86 y.o. male with medical history significant of atrial fibrillation on coumadin, HLD, hx of seizures, PD, BPH and hyponatremia presenting to ED with complaints of abnormal lab. He had labs checked at his Pcp and sodium was trending downwards (125) and was sent to the hospital for further evaluation and treatment.  Patient admitted as above with acute metabolic encephalopathy likely secondary to acute on chronic hyponatremia in the setting of cerebral salt wasting with concurrent cellulitis and ambulatory dysfunction.  There was some concern the patient had been falling and hit his head exacerbating his chronic issue with symptomatic hyponatremia.  Likely patient's cellulitis was the initiating factor triggering both his hyponatremia and subsequent fall.  He will continue Keflex through 01/29/2022, right lower extremity cellulitis appears to be resolving at this point.  Patient's hyponatremia initially continued to downtrend despite increased oral sodium chloride, nephrology was consulted given unclear etiology and unexpected labs given his history of cerebral salt wasting.  Patient was to be evaluated for adrenal insufficiency, unfortunately labs were unable to be collected adequately and his cortisol levels do appear to be within normal range based on limited information.  At this time on increased p.o. intake of salt patient's sodium has continued to rise as appropriate, will discharge patient on increased sodium of 2 g twice daily  with close follow-up with PCP for lab work later this week.  This salt supplementation may need to be adjusted given his uptrending sodium levels over the past 3 days.  Certainly do not want to overcorrect his sodium given his chronic hyponatremic state.  Patient otherwise appears back to baseline, wife at bedside indicates he appears to be his usual self, PT OT recommending ongoing therapy at his facility but otherwise he is stable and agreeable for discharge home.   Discharge Diagnoses:  Principal Problem:   Hyponatremia Active Problems:   Cellulitis of left lower leg   Thyroid nodule   Seizure disorder (HCC)   Chronic atrial fibrillation   Parkinsonian syndrome associated with symptomatic orthostatic hypotension (HCC)   Benign prostatic hyperplasia with urinary frequency   Acute hyponatremia    Discharge Instructions   Allergies as of 01/27/2022       Reactions   Depakote [divalproex Sodium] Other (See Comments)   Arthralgias        Medication List     STOP taking these medications    LORazepam 2 MG/ML concentrated solution Commonly known as: LORazepam Intensol       TAKE these medications    acetaminophen 325 MG tablet Commonly known as: TYLENOL Take 650 mg by mouth every 6 (six) hours as needed for mild pain or moderate pain.   ALPRAZolam 0.25 MG tablet Commonly known as: XANAX Take 1 tablet (0.25 mg total) by mouth 2 (two) times daily.   brimonidine-timolol 0.2-0.5 % ophthalmic solution Commonly known as: COMBIGAN Place 1 drop into both eyes every 12 (twelve) hours.   cephALEXin 500 MG capsule Commonly known as: KEFLEX Take 1 capsule (500 mg total) by mouth 3 (three) times daily for 10 days.   cholecalciferol 10  MCG (400 UNIT) Tabs tablet Commonly known as: VITAMIN D3 Take 400 Units by mouth 2 (two) times daily.   clobetasol cream 0.05 % Commonly known as: TEMOVATE Apply 1 application  topically 2 (two) times daily as needed (rash).   econazole  nitrate 1 % cream Apply 1 Application topically 2 (two) times daily.   finasteride 5 MG tablet Commonly known as: PROSCAR Take 5 mg by mouth every morning.   flecainide 50 MG tablet Commonly known as: TAMBOCOR Take 50 mg by mouth 2 (two) times daily.   fluocinonide ointment 0.05 % Commonly known as: LIDEX Apply 1 Application topically 2 (two) times daily as needed (rash).   lacosamide 200 MG Tabs tablet Commonly known as: Vimpat Take 1 tablet (200 mg total) by mouth 2 (two) times daily.   latanoprost 0.005 % ophthalmic solution Commonly known as: XALATAN Place 1 drop into both eyes at bedtime.   levETIRAcetam 750 MG 24 hr tablet Commonly known as: KEPPRA XR Take 2,250 mg by mouth daily.   miconazole 2 % powder Commonly known as: MICOTIN Apply topically 2 (two) times daily.   phenytoin 100 MG ER capsule Commonly known as: DILANTIN Take 300 mg by mouth at bedtime.   phenytoin 50 MG tablet Commonly known as: DILANTIN Chew 75 mg by mouth daily.   polyethylene glycol 17 g packet Commonly known as: MIRALAX / GLYCOLAX Take 17 g by mouth every other day.   sodium chloride 1 g tablet Take 2 tablets (2 g total) by mouth 2 (two) times daily with a meal. What changed: how much to take   tamsulosin 0.4 MG Caps capsule Commonly known as: FLOMAX Take 0.4 mg by mouth daily.   triamcinolone cream 0.1 % Commonly known as: KENALOG Apply 1 Application topically 2 (two) times daily as needed (flares).   Tums Smoothies 750 MG chewable tablet Generic drug: calcium carbonate Chew 2 tablets by mouth 4 (four) times daily as needed for heartburn.   warfarin 2.5 MG tablet Commonly known as: COUMADIN Take 1 tablet (2.5 mg total) by mouth daily at 12 noon. What changed:  how much to take when to take this additional instructions        Allergies  Allergen Reactions   Depakote [Divalproex Sodium] Other (See Comments)    Arthralgias      Consultations: Nephrology  Procedures/Studies: US THYROID  Result Date: 01/21/2022 CLINICAL DATA:  Thyroid nodule EXAM: THYROID ULTRASOUND TECHNIQUE: Ultrasound examination of the thyroid gland and adjacent soft tissues was performed. COMPARISON:  CT chest without contrast 01/14/2022 FINDINGS: Parenchymal Echotexture: Mildly heterogenous Isthmus: 0.4 cm Right lobe: 2.2 x 1.1 x 1.4 cm Left lobe: 1.4 x 0.8 x 0.9 _________________________________________________________ Estimated total number of nodules >/= 1 cm: 1 Number of spongiform nodules >/=  2 cm not described below (TR1): 0 Number of mixed cystic and solid nodules >/= 1.5 cm not described below (TR2): 0 _________________________________________________________ 2.3 cm cystic right inferior thyroid nodule corresponds to the abnormality seen on recent chest CT, and does not meet criteria for imaging surveillance or FNA. IMPRESSION: Solitary, cystic right inferior thyroid nodule does not meet criteria for imaging surveillance or FNA. The above is in keeping with the ACR TI-RADS recommendations - J Am Coll Radiol 2017;14:587-595. Electronically Signed   By: Miachel Roux M.D.   On: 01/21/2022 08:07   CT Chest Wo Contrast  Result Date: 01/14/2022 CLINICAL DATA:  Pneumonia, complication suspected, xray done EXAM: CT CHEST WITHOUT CONTRAST TECHNIQUE: Multidetector CT imaging of  the chest was performed following the standard protocol without IV contrast. RADIATION DOSE REDUCTION: This exam was performed according to the departmental dose-optimization program which includes automated exposure control, adjustment of the mA and/or kV according to patient size and/or use of iterative reconstruction technique. COMPARISON:  Correlation is made with a chest x-ray done on the same day earlier FINDINGS: Cardiovascular: Mild atheromatous calcifications of the thoracic aorta. No aneurysm. Severe atheromatous no significant vascular findings. Normal heart size. No  pericardial effusion. Mediastinum/Nodes: Hypodense lesion in the right thyroid lobe measuring 2.2 x 1.6 cm. Left thyroid lobe has a normal appearance. No mediastinal mass or significant lymphadenopathy. No significant axillary lymphadenopathy. Small hiatal hernia. Lungs/Pleura: No focal consolidation, pleural effusion or vascular congestion. There are some interstitial changes seen with a subpleural reticulations at the bibasilar regions. There is band of chronic atelectasis/pulmonary scarring seen at the left lung base. Upper Abdomen: No acute abnormality. Musculoskeletal: Again seen is the left subclavian pacer defibrillator. There is a nerve stimulator at the right side of the chest. Mild-to-moderate spondylosis of the cervical and thoracic spine. IMPRESSION: 1. No focal consolidation, pleural effusion or vascular congestion. Interstitial changes at the lung bases. There is a band of atelectasis/scarring at the left lung base. 2. Mild cardiomegaly. Severe atheromatous calcifications of the coronary arteries. 3.  2.2 x 1.6 cm hypodense lesion in the right thyroid lobe. Pathology: 2.2 cm incidental right thyroid nodule. Recommend thyroid US.Reference: J Am Coll Radiol. 2015 Feb;12(2): 143-50 Electronically Signed   By: Frazier Richards M.D.   On: 01/14/2022 10:47   CT HEAD WO CONTRAST (5MM)  Result Date: 01/14/2022 CLINICAL DATA:  Head trauma.  Fall on Coumadin. EXAM: CT HEAD WITHOUT CONTRAST TECHNIQUE: Contiguous axial images were obtained from the base of the skull through the vertex without intravenous contrast. RADIATION DOSE REDUCTION: This exam was performed according to the departmental dose-optimization program which includes automated exposure control, adjustment of the mA and/or kV according to patient size and/or use of iterative reconstruction technique. COMPARISON:  Head CT 01/29/2019 FINDINGS: Brain: There is no evidence of an acute infarct, intracranial hemorrhage, mass, midline shift, or  extra-axial fluid collection. Sequelae of anterior right temporal lobectomy are again identified. There is an unchanged small chronic infarct involving the right cingulate gyrus. Hypodensities elsewhere in the cerebral white matter bilaterally are unchanged and nonspecific but compatible with mild chronic small vessel ischemic disease. There is mild global cerebral atrophy. Vascular: Calcified atherosclerosis at the skull base. No hyperdense vessel. Skull: Right pterional craniotomy.  No acute fracture. Sinuses/Orbits: Mucosal thickening throughout the paranasal sinuses including subtotal opacification of the right maxillary sinus by circumferential mucosal thickening. Bubbly secretions in the left maxillary sinus. Small, chronic left mastoid effusion. Bilateral cataract extraction. Other: Mild right occipital scalp soft tissue swelling. IMPRESSION: 1. No evidence of acute intracranial abnormality. 2. Mild right occipital scalp soft tissue swelling. 3. Chronic brain findings as above. Electronically Signed   By: Logan Bores M.D.   On: 01/14/2022 10:36   DG Chest Portable 1 View  Result Date: 01/14/2022 CLINICAL DATA:  86 year old male presenting for evaluation of fall, level 2 trauma with history of blood thinners EXAM: PORTABLE CHEST 1 VIEW COMPARISON:  September 03, 2018. FINDINGS: Nerve stimulator and cardiac pacer defibrillator in place over the chest. Leads extending cephalad from nerve stimulator. Dual lead pacer defibrillator with similar appearance to prior imaging. EKG leads project over the chest. Trachea midline. Cardiomediastinal contours and hilar structures are stable accounting for portable technique  and AP projection. Subtle interstitial opacities and patchy opacities in the chest greatest in the LEFT mid and lower chest. No visible pneumothorax. No lobar consolidation. No gross pleural effusion on frontal radiograph. On limited assessment no acute skeletal findings. IMPRESSION: Subtle interstitial  opacities and patchy opacities in the chest greatest in the LEFT mid and lower chest. Findings may represent asymmetric edema or atypical infection. Electronically Signed   By: Zetta Bills M.D.   On: 01/14/2022 09:45     Subjective: No acute issues or events overnight denies nausea vomiting diarrhea constipation headache fevers chills or chest pain   Discharge Exam: Vitals:   01/27/22 0500 01/27/22 0804  BP: (!) 155/73 (!) 176/75  Pulse: 61 66  Resp: 18   Temp: 98.3 F (36.8 C) 98.3 F (36.8 C)  SpO2: 98% 98%   Vitals:   01/26/22 1632 01/26/22 2033 01/27/22 0500 01/27/22 0804  BP: (!) 155/60 (!) 170/61 (!) 155/73 (!) 176/75  Pulse: 63 64 61 66  Resp: '16 18 18   '$ Temp: 97.8 F (36.6 C) 98.4 F (36.9 C) 98.3 F (36.8 C) 98.3 F (36.8 C)  TempSrc: Oral Oral Oral Oral  SpO2: 97% 94% 98% 98%  Weight:      Height:        General: Pt is alert, awake, not in acute distress Cardiovascular: RRR, S1/S2 +, no rubs, no gallops Respiratory: CTA bilaterally, no wheezing, no rhonchi Abdominal: Soft, NT, ND, bowel sounds + Extremities: no edema, no cyanosis    The results of significant diagnostics from this hospitalization (including imaging, microbiology, ancillary and laboratory) are listed below for reference.     Microbiology: No results found for this or any previous visit (from the past 240 hour(s)).   Labs: BNP (last 3 results) No results for input(s): "BNP" in the last 8760 hours. Basic Metabolic Panel: Recent Labs  Lab 01/23/22 0533 01/24/22 0352 01/25/22 0647 01/26/22 0547 01/27/22 0333  NA 124* 123* 123* 129* 131*  K 4.2 4.4 4.5 4.5 4.7  CL 94* 95* 95* 99 104  CO2 22 20* 21* 23 17*  GLUCOSE 92 96 95 100* 108*  BUN '8 8 9 10 17  '$ CREATININE 0.57* 0.53* 0.60* 0.64 0.76  CALCIUM 8.7* 8.3* 8.5* 8.7* 7.6*   Liver Function Tests: No results for input(s): "AST", "ALT", "ALKPHOS", "BILITOT", "PROT", "ALBUMIN" in the last 168 hours. No results for input(s):  "LIPASE", "AMYLASE" in the last 168 hours. No results for input(s): "AMMONIA" in the last 168 hours. CBC: Recent Labs  Lab 01/21/22 0844  WBC 4.3  HGB 11.6*  HCT 31.5*  MCV 88.5  PLT 236   Cardiac Enzymes: No results for input(s): "CKTOTAL", "CKMB", "CKMBINDEX", "TROPONINI" in the last 168 hours. BNP: Invalid input(s): "POCBNP" CBG: No results for input(s): "GLUCAP" in the last 168 hours. D-Dimer No results for input(s): "DDIMER" in the last 72 hours. Hgb A1c No results for input(s): "HGBA1C" in the last 72 hours. Lipid Profile No results for input(s): "CHOL", "HDL", "LDLCALC", "TRIG", "CHOLHDL", "LDLDIRECT" in the last 72 hours. Thyroid function studies No results for input(s): "TSH", "T4TOTAL", "T3FREE", "THYROIDAB" in the last 72 hours.  Invalid input(s): "FREET3" Anemia work up No results for input(s): "VITAMINB12", "FOLATE", "FERRITIN", "TIBC", "IRON", "RETICCTPCT" in the last 72 hours. Urinalysis    Component Value Date/Time   COLORURINE STRAW (A) 01/19/2022 1458   APPEARANCEUR CLEAR 01/19/2022 1458   LABSPEC 1.010 01/19/2022 1458   PHURINE 7.0 01/19/2022 1458   GLUCOSEU NEGATIVE 01/19/2022  [place] 01/19/2022 [place] 01/19/2022 [place] 01/19/2022 1458   PROTEINUR NEGATIVE 01/19/2022 1458   UROBILINOGEN 0.2 04/10/2013 0910   NITRITE NEGATIVE 01/19/2022 1458   LEUKOCYTESUR NEGATIVE 01/19/2022 1458   Sepsis Labs Recent Labs  Lab 01/21/22 0844  WBC 4.3   Microbiology No results found for this or any previous visit (from the past 240 hour(s)).   Time coordinating discharge: Over 30 minutes  SIGNED:   Little Ishikawa, DO [facility] 01/27/2022, 8:21 AM Pager   If 7PM-7AM, please contact night-coverage [email or web address]

## 2022-01-27 NOTE — Care Management (Signed)
Faxed [place] orders to [facility] [phone]

## 2022-01-27 NOTE — Progress Notes (Signed)
ANTICOAGULATION CONSULT NOTE - Follow Up Consult  Pharmacy Consult for Warfarin Indication: atrial fibrillation  Allergies  Allergen Reactions   Depakote [Divalproex Sodium] Other (See Comments)    Arthralgias    Patient Measurements: Height: '5\' 5"'$  (165.1 cm) Weight: 77.1 kg (169 lb 15.6 oz) IBW/kg (Calculated) : 61.5  Vital Signs: Temp: 98.Cameron F (36.8 C) (08/13 0804) Temp Source: Oral (08/13 0804) BP: 176/75 (08/13 0804) Pulse Rate: 66 (08/13 0804)  Labs: Recent Labs    01/25/22 0647 01/26/22 0547 01/27/22 0333 01/27/22 0508  LABPROT 23.1*  --   --  25.5*  INR 2.1*  --   --  2.4*  CREATININE 0.60* 0.64 0.76  --     Estimated Creatinine Clearance: 65.8 mL/min (by C-G formula based on SCr of 0.76 mg/dL).  Assessment: 86 yo Potts with chronic afib on PTA warfarin, HLD, hx of seizures, parkinson's disease, and BPH admitted for hyponatremia and cellulitis of left leg. Pharmacy consulted to dose warfarin.  INR within goal at 2.4. Hgb 11.6, plt 236 on 01/21/22. No signs/symptoms of bleeding noted.  On PTA regimen of 2.5 mg daily, except 2 mg on Wednesdays.    Goal of Therapy:  INR 2-Cameron Monitor platelets by anticoagulation protocol: Yes   Plan:  Continue warfarin 2.5 mg daily, except 2 mg on Wednesdays. 2.5 mg due today. Daily PT/INR for now. Intermittent CBC. Monitor for signs/symptoms of bleeding.  Jeneen Rinks, RPh 02/15/5175,1:60 AM

## 2022-01-27 NOTE — Progress Notes (Signed)
Patient ID: [name], male   DOB: [birth year], 86 y.o.   MRN: [id number] S: Feels well and ready to go home. O:BP (!) 176/75 (BP Location: Right Arm)   Pulse 66   Temp 98.3 F (36.8 C) (Oral)   Resp 18   Ht '5\' 5"'$  (1.651 m)   Wt 77.1 kg   SpO2 98%   BMI 28.29 kg/m   Intake/Output Summary (Last 24 hours) at 01/27/2022 1203 Last data filed at 01/26/2022 2300 Gross per 24 hour  Intake --  Output 500 ml  Net -500 ml   Intake/Output: I/O last 3 completed shifts: In: -  Out: 2751 [Urine:2750; Stool:1]  Intake/Output this shift:  No intake/output data recorded. Weight change:  Gen: NAD CVS: RRR Resp:CTA Abd: +BS, soft, NT/ND Ext: trace pretibial edema of RLE, none on LLE  Recent Labs  Lab 01/21/22 0844 01/22/22 0456 01/23/22 0533 01/24/22 0352 01/25/22 0647 01/26/22 0547 01/27/22 0333  NA 123* 122* 124* 123* 123* 129* 131*  K 4.1 4.4 4.2 4.4 4.5 4.5 4.7  CL 94* 94* 94* 95* 95* 99 104  CO2 19* 20* 22 20* 21* 23 17*  GLUCOSE 91 90 92 96 95 100* 108*  BUN 7* '9 8 8 9 10 17  '$ CREATININE 0.62 0.61 0.57* 0.53* 0.60* 0.64 0.76  CALCIUM 8.7* 8.2* 8.7* 8.3* 8.5* 8.7* 7.6*   Liver Function Tests: No results for input(s): "AST", "ALT", "ALKPHOS", "BILITOT", "PROT", "ALBUMIN" in the last 168 hours. No results for input(s): "LIPASE", "AMYLASE" in the last 168 hours. No results for input(s): "AMMONIA" in the last 168 hours. CBC: Recent Labs  Lab 01/21/22 0844  WBC 4.3  HGB 11.6*  HCT 31.5*  MCV 88.5  PLT 236   Cardiac Enzymes: No results for input(s): "CKTOTAL", "CKMB", "CKMBINDEX", "TROPONINI" in the last 168 hours. CBG: No results for input(s): "GLUCAP" in the last 168 hours.  Iron Studies: No results for input(s): "IRON", "TIBC", "TRANSFERRIN", "FERRITIN" in the last 72 hours. Studies/Results: No results found.  ALPRAZolam  0.25 mg Oral BID   brimonidine  1 drop Both Eyes BID   cephALEXin  500 mg Oral TID   finasteride  5 mg Oral q morning   flecainide   50 mg Oral BID   lacosamide  200 mg Oral BID   latanoprost  1 drop Both Eyes QHS   levETIRAcetam  2,250 mg Oral Daily   phenytoin  75 mg Oral Daily   phenytoin  300 mg Oral QHS   polyethylene glycol  17 g Oral Daily   sodium chloride flush  3 mL Intravenous Q12H   sodium chloride  2 g Oral TID WC   tamsulosin  0.4 mg Oral Daily   timolol  1 drop Both Eyes BID   warfarin  2.5 mg Oral Once per day on Sun Mon Tue Thu Fri Sat   And   warfarin  2 mg Oral Once per day on Wed   Warfarin - Pharmacist Dosing Inpatient   Does not apply q1600    BMET    Component Value Date/Time   NA 131 (L) 01/27/2022 0333   NA 134 (A) 11/16/2021 1627   K 4.7 01/27/2022 0333   CL 104 01/27/2022 0333   CO2 17 (L) 01/27/2022 0333   GLUCOSE 108 (H) 01/27/2022 0333   BUN 17 01/27/2022 0333   BUN 14 11/16/2021 1627   CREATININE 0.76 01/27/2022 0333   CALCIUM 7.6 (L) 01/27/2022 0333   GFRNONAA >60  01/27/2022 0333   GFRAA >90 01/03/2020 0000   CBC    Component Value Date/Time   WBC 4.3 01/21/2022 0844   RBC 3.56 (L) 01/21/2022 0844   HGB 11.6 (L) 01/21/2022 0844   HGB 13.9 08/02/2019 1128   HCT 31.5 (L) 01/21/2022 0844   HCT 38.9 08/02/2019 1128   PLT 236 01/21/2022 0844   PLT 225 08/02/2019 1128   MCV 88.5 01/21/2022 0844   MCV 92 08/02/2019 1128   MCH 32.6 01/21/2022 0844   MCHC 36.8 (H) 01/21/2022 0844   RDW 12.1 01/21/2022 0844   RDW 13.2 08/02/2019 1128   LYMPHSABS 1.5 01/19/2022 1139   LYMPHSABS 1.7 08/02/2019 1128   MONOABS 0.7 01/19/2022 1139   EOSABS 0.6 (H) 01/19/2022 1139   EOSABS 0.6 (H) 08/02/2019 1128   BASOSABS 0.1 01/19/2022 1139   BASOSABS 0.1 08/02/2019 1128    Assessment/Plan:   Acute on chronic hyponatremia - baseline Na 130-133.  Sosm 258, SNa 123, Uosm 436, UNa 142, TSH 2.685, am cortisol 3.6.  He has been taking NaCl tabs for quite some time and is up to 6 grams/day.  Serum Na rose from 123-124 up to 129 8/12 and 131 today.  I think there was another mistake on the  cosyntropin stim test as the base cortisol was 20.5, 30 min 9.6, and 60 min 8.5.  The patient also states that they only drew blood twice that morning and not 3.  REpeat am cortisol yesterday was 8.6.  Na continues to improve and he is stable for discharge today.  He is asymptomatic.  Low uric acid level more in favor of SIADH over cerebral salt wasting.  Will need to have sodium levels checked later this week at [facility].   No need for outpatient follow up with our office if continues to improve as an outpatient.   Parkinsonian syndrome - s/p vagus nerve stimulator. Chronic atrial fibrillation - rate controlled.  On coumadin Seizure disorder - on Vimpat, levetiracetam, and dilantin.  Right lower extremity cellulitis - completed abx. Disposition - stable for discharge to home.     Donetta Potts, MD [facility]

## 2022-01-27 NOTE — Progress Notes (Signed)
CSW arranged transport via [facility] for pick up at 12:30pm.  The number to call for report is [phone].  Madilyn Fireman, MSW, LCSW Transitions of Care  Clinical Social Worker II [phone]

## 2022-01-27 NOTE — Progress Notes (Signed)
Report given to [facility], LPN at [facility] facility. Both IV's removed. Patient dressed and has belongings with him. Waiting for [facility] for transportation.

## 2022-01-28 ENCOUNTER — Telehealth: Payer: Self-pay

## 2022-01-28 ENCOUNTER — Encounter: Payer: Self-pay | Admitting: Adult Health

## 2022-01-28 ENCOUNTER — Non-Acute Institutional Stay (SKILLED_NURSING_FACILITY): Payer: Medicare Other | Admitting: Adult Health

## 2022-01-28 DIAGNOSIS — E041 Nontoxic single thyroid nodule: Secondary | ICD-10-CM | POA: Diagnosis not present

## 2022-01-28 DIAGNOSIS — L03115 Cellulitis of right lower limb: Secondary | ICD-10-CM

## 2022-01-28 DIAGNOSIS — I951 Orthostatic hypotension: Secondary | ICD-10-CM

## 2022-01-28 DIAGNOSIS — R531 Weakness: Secondary | ICD-10-CM

## 2022-01-28 DIAGNOSIS — I482 Chronic atrial fibrillation, unspecified: Secondary | ICD-10-CM | POA: Diagnosis not present

## 2022-01-28 DIAGNOSIS — G20 Parkinson's disease: Secondary | ICD-10-CM

## 2022-01-28 DIAGNOSIS — Z9181 History of falling: Secondary | ICD-10-CM | POA: Diagnosis not present

## 2022-01-28 DIAGNOSIS — G3184 Mild cognitive impairment, so stated: Secondary | ICD-10-CM | POA: Diagnosis not present

## 2022-01-28 DIAGNOSIS — L111 Transient acantholytic dermatosis [Grover]: Secondary | ICD-10-CM | POA: Diagnosis not present

## 2022-01-28 DIAGNOSIS — R278 Other lack of coordination: Secondary | ICD-10-CM | POA: Diagnosis not present

## 2022-01-28 DIAGNOSIS — B353 Tinea pedis: Secondary | ICD-10-CM

## 2022-01-28 DIAGNOSIS — G9341 Metabolic encephalopathy: Secondary | ICD-10-CM | POA: Diagnosis not present

## 2022-01-28 DIAGNOSIS — R2689 Other abnormalities of gait and mobility: Secondary | ICD-10-CM | POA: Diagnosis not present

## 2022-01-28 DIAGNOSIS — G40909 Epilepsy, unspecified, not intractable, without status epilepticus: Secondary | ICD-10-CM | POA: Diagnosis not present

## 2022-01-28 DIAGNOSIS — E871 Hypo-osmolality and hyponatremia: Secondary | ICD-10-CM | POA: Diagnosis not present

## 2022-01-28 NOTE — Progress Notes (Unsigned)
Location:    [facility] Room Number: 156 Place of Service:  SNF [phone]) Provider:  Royal Hawthorn, NP  [name], MD  Patient Care Team: [name], MD as PCP - General (Internal Medicine) Rutherford Guys, MD as Consulting Physician (Ophthalmology) Kathrynn Ducking, MD (Inactive) as Consulting Physician (Neurology) Gayland Curry, DO (Geriatric Medicine)  Extended Emergency Contact Information Primary Emergency Contact: Fryer,[name] Address: [address] [place] [address] Johnnette Litter of [place] Phone: [phone] Mobile Phone: [phone] Relation: Spouse Secondary Emergency Contact: Derringer,Kristian Mobile Phone: [phone] Relation: Son  Code Status:  DNR Goals of care: Advanced Directive information    01/28/2022   10:10 AM  Advanced Directives  Does Patient Have a Medical Advance Directive? Yes  Type of Advance Directive Out of facility DNR (pink MOST or yellow form)  Does patient want to make changes to medical advance directive? No - Patient declined  Pre-existing out of facility DNR order (yellow form or pink MOST form) Pink MOST form placed in chart (order not valid for inpatient use);Yellow form placed in chart (order not valid for inpatient use)     Chief Complaint  Patient presents with  . Acute Visit    HPI:  Pt is a 86 y.o. male seen today for an acute visit for hospital follow up.  He was admitted 01/19/22-01/27/22 due to hyponatremia. PMH significant for afib, HLD, seizures, PD, orthostatic hypotension, frequent falls, grovers disease, chronic hyponatremia, ICD implant, vagal nerve stimulator implant. He was admitted due to acute on chronic hyponatremia with confusion and gait abnormality, likely precipitated by acute RLE cellulitis. NA was 123 on admit. He was given saline, sodium tablets were increased, and FR maintained 1500cc. There was difficulty getting his NA to stabilize. He was  evaluated for adrenal insuff but there was difficulty getting labs drawn at the right time. Sosm 258, SNa 123, Uosm 436, UNa 142, TSH 2.685, am cortisol 3.6. Renal note in indicates SIADH most likely the cause.  He was continued on Keflex for 10 days for the RLE cellulitis which has improved.  Due to weakness he was admitted to [facility] for therapy. He is a fall risk due to attempts to get up without help.    Past Medical History:  Diagnosis Date  . Abnormality of gait 12/21/2013  . Atrial fibrillation ([place])   . Blind left eye   . Carcinoma in situ of prostate   . Cardiac arrest ([place])   . Glaucoma   . Hypercholesteremia   . Hyperlipidemia   . Metabolic encephalopathy   . Mild left ventricular systolic dysfunction   . Presence of other specified functional implants   . PSA elevation   . Right frontal lobe lesion   . Seizures ([place])   . Tinea pedis   . 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 Urinary urgency    Past Surgical History:  Procedure Laterality Date  . CARDIAC DEFIBRILLATOR PLACEMENT    . cataract surgery    . COLONOSCOPY    . CRANIOTOMY Right    right anterior temporal resection  . defibrillator replaced  March 2016  . IMPLANTATION VAGAL NERVE STIMULATOR  June 2016  . NASAL SINUS SURGERY    . TONSILLECTOMY     age 38  . VASECTOMY  1985    Allergies  Allergen Reactions  . Depakote [Divalproex Sodium] Other (See Comments)  Arthralgias     Allergies as of 01/28/2022       Reactions   Depakote [divalproex Sodium] Other (See Comments)   Arthralgias        Medication List        Accurate as of January 28, 2022 10:10 AM. If you have any questions, ask your nurse or doctor.          acetaminophen 325 MG tablet Commonly known as: TYLENOL Take 650 mg by mouth every 6 (six) hours as needed for mild pain or moderate pain.   ALPRAZolam 0.25 MG tablet Commonly known as: XANAX Take 1 tablet (0.25 mg total) by mouth 2 (two) times daily.    brimonidine-timolol 0.2-0.5 % ophthalmic solution Commonly known as: COMBIGAN Place 1 drop into both eyes every 12 (twelve) hours.   cephALEXin 500 MG capsule Commonly known as: KEFLEX Take 1 capsule (500 mg total) by mouth 3 (three) times daily for 10 days.   cholecalciferol 10 MCG (400 UNIT) Tabs tablet Commonly known as: VITAMIN D3 Take 400 Units by mouth 2 (two) times daily.   clobetasol cream 0.05 % Commonly known as: TEMOVATE Apply 1 application  topically 2 (two) times daily as needed (rash).   econazole nitrate 1 % cream Apply 1 Application topically 2 (two) times daily.   finasteride 5 MG tablet Commonly known as: PROSCAR Take 5 mg by mouth every morning.   flecainide 50 MG tablet Commonly known as: TAMBOCOR Take 50 mg by mouth 2 (two) times daily.   fluocinonide ointment 0.05 % Commonly known as: LIDEX Apply 1 Application topically 2 (two) times daily as needed (rash).   lacosamide 200 MG Tabs tablet Commonly known as: Vimpat Take 1 tablet (200 mg total) by mouth 2 (two) times daily.   latanoprost 0.005 % ophthalmic solution Commonly known as: XALATAN Place 1 drop into both eyes at bedtime.   levETIRAcetam 750 MG 24 hr tablet Commonly known as: KEPPRA XR Take 2,250 mg by mouth daily.   LORazepam 2 MG/ML concentrated solution Commonly known as: ATIVAN Take 2 mg by mouth as needed for anxiety.   miconazole 2 % powder Commonly known as: MICOTIN Apply topically 2 (two) times daily.   phenytoin 50 MG tablet Commonly known as: DILANTIN Chew 75 mg by mouth daily.   PHENYTOIN SODIUM EXTENDED PO Take 75 mg by mouth every morning.   polyethylene glycol 17 g packet Commonly known as: MIRALAX / GLYCOLAX Take 17 g by mouth every other day.   sodium chloride 1 g tablet Take 2 tablets (2 g total) by mouth 2 (two) times daily with a meal.   tamsulosin 0.4 MG Caps capsule Commonly known as: FLOMAX Take 0.4 mg by mouth daily.   triamcinolone cream 0.1  % Commonly known as: KENALOG Apply 1 Application topically 2 (two) times daily as needed (flares).   Tums Smoothies 750 MG chewable tablet Generic drug: calcium carbonate Chew 2 tablets by mouth 4 (four) times daily as needed for heartburn.   warfarin 2.5 MG tablet Commonly known as: COUMADIN Take 2.5 mg by mouth. Once A Day on Sun, Mon, Tue, Wed, Thu, Fri, Sat What changed: Another medication with the same name was removed. Continue taking this medication, and follow the directions you see here. Changed by: Royal Hawthorn, NP        Review of Systems  Immunization History  Administered Date(s) Administered  . Influenza, High Dose Seasonal PF 04/07/2020  . Influenza, Seasonal, Injecte, Preservative Fre 02/22/2010  .  Influenza,inj,Quad PF,6+ Mos 04/07/2018, 04/15/2019  . Influenza-Unspecified 03/17/2013, 02/24/2014, 02/16/2015, 03/21/2016, 03/17/2017, 04/07/2018, 03/21/2021  . Moderna SARS-COV2 Booster Vaccination 03/28/2021  . Moderna Sars-Covid-2 Vaccination 06/28/2019, 07/27/2019, 05/01/2020  . Pneumococcal Conjugate-13 07/05/2013  . Pneumococcal Polysaccharide-23 06/17/2006  . Td 06/18/2007  . Tdap 12/07/2015, 09/17/2020  . Zoster Recombinat (Shingrix) 12/04/2016  . Zoster, Live 10/06/2008   Pertinent  Health Maintenance Due  Topic Date Due  . INFLUENZA VACCINE  01/15/2022      01/25/2022    9:00 AM 01/25/2022    7:41 PM 01/26/2022    9:30 AM 01/26/2022    7:59 PM 01/27/2022    8:00 AM  Fall Risk  Patient Fall Risk Level High fall risk High fall risk High fall risk High fall risk High fall risk   Functional Status Survey:    Vitals:   01/28/22 0953  BP: 134/77  Pulse: 60  Resp: 20  Temp: 98.2 F (36.8 C)  SpO2: 97%  Weight: 171 lb 12.8 oz (77.9 kg)  Height: '5\' 5"'$  (1.651 m)   Body mass index is 28.59 kg/m. Physical Exam  Labs reviewed: Recent Labs    01/25/22 0647 01/26/22 0547 01/27/22 0333  NA 123* 129* 131*  K 4.5 4.5 4.7  CL 95* 99 104  CO2  21* 23 17*  GLUCOSE 95 100* 108*  BUN '9 10 17  '$ CREATININE 0.60* 0.64 0.76  CALCIUM 8.5* 8.7* 7.6*   No results for input(s): "AST", "ALT", "ALKPHOS", "BILITOT", "PROT", "ALBUMIN" in the last 8760 hours. Recent Labs    01/19/22 1139 01/19/22 1143 01/20/22 0250 01/21/22 0844  WBC 5.3  --  4.1 4.3  NEUTROABS 2.4  --   --   --   HGB 11.6* 11.2* 11.7* 11.6*  HCT 32.2* 33.0* 31.7* 31.5*  MCV 90.4  --  91.9 88.5  PLT 234  --  213 236   Lab Results  Component Value Date   TSH 2.685 01/19/2022   No results found for: "HGBA1C" Lab Results  Component Value Date   CHOL 214 (A) 05/15/2021   HDL 61 05/15/2021   LDLCALC 141 05/15/2021   TRIG 79 05/15/2021    Significant Diagnostic Results in last 30 days:  US THYROID  Result Date: 01/21/2022 CLINICAL DATA:  Thyroid nodule EXAM: THYROID ULTRASOUND TECHNIQUE: Ultrasound examination of the thyroid gland and adjacent soft tissues was performed. COMPARISON:  CT chest without contrast 01/14/2022 FINDINGS: Parenchymal Echotexture: Mildly heterogenous Isthmus: 0.4 cm Right lobe: 2.2 x 1.1 x 1.4 cm Left lobe: 1.4 x 0.8 x 0.9 _________________________________________________________ Estimated total number of nodules >/= 1 cm: 1 Number of spongiform nodules >/=  2 cm not described below (TR1): 0 Number of mixed cystic and solid nodules >/= 1.5 cm not described below (TR2): 0 _________________________________________________________ 2.3 cm cystic right inferior thyroid nodule corresponds to the abnormality seen on recent chest CT, and does not meet criteria for imaging surveillance or FNA. IMPRESSION: Solitary, cystic right inferior thyroid nodule does not meet criteria for imaging surveillance or FNA. The above is in keeping with the ACR TI-RADS recommendations - J Am Coll Radiol 2017;14:587-595. Electronically Signed   By: Miachel Roux M.D.   On: 01/21/2022 08:07   CT Chest Wo Contrast  Result Date: 01/14/2022 CLINICAL DATA:  Pneumonia, complication  suspected, xray done EXAM: CT CHEST WITHOUT CONTRAST TECHNIQUE: Multidetector CT imaging of the chest was performed following the standard protocol without IV contrast. RADIATION DOSE REDUCTION: This exam was performed according to the departmental  dose-optimization program which includes automated exposure control, adjustment of the mA and/or kV according to patient size and/or use of iterative reconstruction technique. COMPARISON:  Correlation is made with a chest x-ray done on the same day earlier FINDINGS: Cardiovascular: Mild atheromatous calcifications of the thoracic aorta. No aneurysm. Severe atheromatous no significant vascular findings. Normal heart size. No pericardial effusion. Mediastinum/Nodes: Hypodense lesion in the right thyroid lobe measuring 2.2 x 1.6 cm. Left thyroid lobe has a normal appearance. No mediastinal mass or significant lymphadenopathy. No significant axillary lymphadenopathy. Small hiatal hernia. Lungs/Pleura: No focal consolidation, pleural effusion or vascular congestion. There are some interstitial changes seen with a subpleural reticulations at the bibasilar regions. There is band of chronic atelectasis/pulmonary scarring seen at the left lung base. Upper Abdomen: No acute abnormality. Musculoskeletal: Again seen is the left subclavian pacer defibrillator. There is a nerve stimulator at the right side of the chest. Mild-to-moderate spondylosis of the cervical and thoracic spine. IMPRESSION: 1. No focal consolidation, pleural effusion or vascular congestion. Interstitial changes at the lung bases. There is a band of atelectasis/scarring at the left lung base. 2. Mild cardiomegaly. Severe atheromatous calcifications of the coronary arteries. 3.  2.2 x 1.6 cm hypodense lesion in the right thyroid lobe. Pathology: 2.2 cm incidental right thyroid nodule. Recommend thyroid US.Reference: J Am Coll Radiol. 2015 Feb;12(2): 143-50 Electronically Signed   By: Frazier Richards M.D.   On:  01/14/2022 10:47   CT HEAD WO CONTRAST (5MM)  Result Date: 01/14/2022 CLINICAL DATA:  Head trauma.  Fall on Coumadin. EXAM: CT HEAD WITHOUT CONTRAST TECHNIQUE: Contiguous axial images were obtained from the base of the skull through the vertex without intravenous contrast. RADIATION DOSE REDUCTION: This exam was performed according to the departmental dose-optimization program which includes automated exposure control, adjustment of the mA and/or kV according to patient size and/or use of iterative reconstruction technique. COMPARISON:  Head CT 01/29/2019 FINDINGS: Brain: There is no evidence of an acute infarct, intracranial hemorrhage, mass, midline shift, or extra-axial fluid collection. Sequelae of anterior right temporal lobectomy are again identified. There is an unchanged small chronic infarct involving the right cingulate gyrus. Hypodensities elsewhere in the cerebral white matter bilaterally are unchanged and nonspecific but compatible with mild chronic small vessel ischemic disease. There is mild global cerebral atrophy. Vascular: Calcified atherosclerosis at the skull base. No hyperdense vessel. Skull: Right pterional craniotomy.  No acute fracture. Sinuses/Orbits: Mucosal thickening throughout the paranasal sinuses including subtotal opacification of the right maxillary sinus by circumferential mucosal thickening. Bubbly secretions in the left maxillary sinus. Small, chronic left mastoid effusion. Bilateral cataract extraction. Other: Mild right occipital scalp soft tissue swelling. IMPRESSION: 1. No evidence of acute intracranial abnormality. 2. Mild right occipital scalp soft tissue swelling. 3. Chronic brain findings as above. Electronically Signed   By: Logan Bores M.D.   On: 01/14/2022 10:36   DG Chest Portable 1 View  Result Date: 01/14/2022 CLINICAL DATA:  86 year old male presenting for evaluation of fall, level 2 trauma with history of blood thinners EXAM: PORTABLE CHEST 1 VIEW  COMPARISON:  September 03, 2018. FINDINGS: Nerve stimulator and cardiac pacer defibrillator in place over the chest. Leads extending cephalad from nerve stimulator. Dual lead pacer defibrillator with similar appearance to prior imaging. EKG leads project over the chest. Trachea midline. Cardiomediastinal contours and hilar structures are stable accounting for portable technique and AP projection. Subtle interstitial opacities and patchy opacities in the chest greatest in the LEFT mid and lower chest. No visible  pneumothorax. No lobar consolidation. No gross pleural effusion on frontal radiograph. On limited assessment no acute skeletal findings. IMPRESSION: Subtle interstitial opacities and patchy opacities in the chest greatest in the LEFT mid and lower chest. Findings may represent asymmetric edema or atypical infection. Electronically Signed   By: Zetta Bills M.D.   On: 01/14/2022 09:45    Assessment/Plan There are no diagnoses linked to this encounter.   Family/ staff Communication:   Labs/tests ordered:

## 2022-01-28 NOTE — Progress Notes (Signed)
This encounter was created in error - please disregard.

## 2022-01-28 NOTE — Telephone Encounter (Signed)
Transition Care Management Unsuccessful Follow-up Telephone Call  Date of discharge and from where:  01/27/2022, [facility]  Attempts:  1st Attempt  Reason for unsuccessful TCM follow-up call:  Unable to leave message, Unable to be reached ;due to release into a skilled nursing facility.

## 2022-01-29 ENCOUNTER — Encounter: Payer: Self-pay | Admitting: Adult Health

## 2022-01-29 DIAGNOSIS — R2689 Other abnormalities of gait and mobility: Secondary | ICD-10-CM | POA: Diagnosis not present

## 2022-01-29 DIAGNOSIS — Z9181 History of falling: Secondary | ICD-10-CM | POA: Diagnosis not present

## 2022-01-29 DIAGNOSIS — E871 Hypo-osmolality and hyponatremia: Secondary | ICD-10-CM | POA: Diagnosis not present

## 2022-01-29 DIAGNOSIS — R278 Other lack of coordination: Secondary | ICD-10-CM | POA: Diagnosis not present

## 2022-01-29 DIAGNOSIS — G9341 Metabolic encephalopathy: Secondary | ICD-10-CM | POA: Diagnosis not present

## 2022-01-29 DIAGNOSIS — G20 Parkinson's disease: Secondary | ICD-10-CM | POA: Diagnosis not present

## 2022-01-30 DIAGNOSIS — G9341 Metabolic encephalopathy: Secondary | ICD-10-CM | POA: Diagnosis not present

## 2022-01-30 DIAGNOSIS — L03116 Cellulitis of left lower limb: Secondary | ICD-10-CM | POA: Diagnosis not present

## 2022-01-30 DIAGNOSIS — R278 Other lack of coordination: Secondary | ICD-10-CM | POA: Diagnosis not present

## 2022-01-30 DIAGNOSIS — Z9181 History of falling: Secondary | ICD-10-CM | POA: Diagnosis not present

## 2022-01-30 DIAGNOSIS — G20 Parkinson's disease: Secondary | ICD-10-CM | POA: Diagnosis not present

## 2022-01-30 DIAGNOSIS — R2689 Other abnormalities of gait and mobility: Secondary | ICD-10-CM | POA: Diagnosis not present

## 2022-01-30 DIAGNOSIS — E871 Hypo-osmolality and hyponatremia: Secondary | ICD-10-CM | POA: Diagnosis not present

## 2022-01-30 DIAGNOSIS — M6289 Other specified disorders of muscle: Secondary | ICD-10-CM | POA: Diagnosis not present

## 2022-01-31 ENCOUNTER — Non-Acute Institutional Stay (SKILLED_NURSING_FACILITY): Payer: Medicare Other | Admitting: Adult Health

## 2022-01-31 ENCOUNTER — Encounter: Payer: Self-pay | Admitting: Adult Health

## 2022-01-31 DIAGNOSIS — I482 Chronic atrial fibrillation, unspecified: Secondary | ICD-10-CM

## 2022-01-31 DIAGNOSIS — G20 Parkinson's disease: Secondary | ICD-10-CM | POA: Diagnosis not present

## 2022-01-31 DIAGNOSIS — I951 Orthostatic hypotension: Secondary | ICD-10-CM

## 2022-01-31 DIAGNOSIS — E871 Hypo-osmolality and hyponatremia: Secondary | ICD-10-CM

## 2022-01-31 DIAGNOSIS — G40909 Epilepsy, unspecified, not intractable, without status epilepticus: Secondary | ICD-10-CM | POA: Diagnosis not present

## 2022-01-31 DIAGNOSIS — G9341 Metabolic encephalopathy: Secondary | ICD-10-CM | POA: Diagnosis not present

## 2022-01-31 DIAGNOSIS — R350 Frequency of micturition: Secondary | ICD-10-CM

## 2022-01-31 DIAGNOSIS — R531 Weakness: Secondary | ICD-10-CM

## 2022-01-31 DIAGNOSIS — N401 Enlarged prostate with lower urinary tract symptoms: Secondary | ICD-10-CM | POA: Diagnosis not present

## 2022-01-31 DIAGNOSIS — E041 Nontoxic single thyroid nodule: Secondary | ICD-10-CM

## 2022-01-31 DIAGNOSIS — R2689 Other abnormalities of gait and mobility: Secondary | ICD-10-CM | POA: Diagnosis not present

## 2022-01-31 DIAGNOSIS — R278 Other lack of coordination: Secondary | ICD-10-CM | POA: Diagnosis not present

## 2022-01-31 DIAGNOSIS — Z9181 History of falling: Secondary | ICD-10-CM | POA: Diagnosis not present

## 2022-01-31 NOTE — Progress Notes (Addendum)
Location:  [facility] Room Number: 156A Place of Service:  SNF [phone]) Provider:  Royal Hawthorn, NP  [name], MD  Patient Care Team: [name], MD as PCP - General (Internal Medicine) Rutherford Guys, MD as Consulting Physician (Ophthalmology) Kathrynn Ducking, MD (Inactive) as Consulting Physician (Neurology) Gayland Curry, DO (Geriatric Medicine)  Extended Emergency Contact Information Primary Emergency Contact: Mancillas,[name] Address: [address]          [place] [address] Johnnette Litter of [place] Phone: [phone] Mobile Phone: [phone] Relation: Spouse Secondary Emergency Contact: Porcaro,Davan Mobile Phone: [phone] Relation: Son  Code Status:  DNR Goals of care: Advanced Directive information    01/28/2022   10:10 AM  Advanced Directives  Does Patient Have a Medical Advance Directive? Yes  Type of Advance Directive Out of facility DNR (pink MOST or yellow form)  Does patient want to make changes to medical advance directive? No - Patient declined  Pre-existing out of facility DNR order (yellow form or pink MOST form) Pink MOST form placed in chart (order not valid for inpatient use);Yellow form placed in chart (order not valid for inpatient use)     Chief Complaint  Patient presents with   Acute Visit    Skilled Rehab Discharge.     HPI:  Pt is a 86 y.o. male seen today for an acute visit for discharge from skilled rehab.  He was admitted 01/19/22-01/27/22 due to hyponatremia. PMH significant for afib, HLD, seizures, PD, orthostatic hypotension, frequent falls, grovers disease, chronic hyponatremia, ICD implant, vagal nerve stimulator implant. He was admitted due to acute on chronic hyponatremia with confusion and gait abnormality, likely precipitated by acute RLE cellulitis. NA was 123 on admit. He was given saline, sodium tablets were increased, and FR maintained 1500cc. There was difficulty  getting his NA to stabilize. He was evaluated for adrenal insuff but there was difficulty getting labs drawn at the right time. Sosm 258, SNa 123, Uosm 436, UNa 142, TSH 2.685, am cortisol 3.6. Renal note in indicates SIADH most likely the cause.  He was continued on Keflex for 10 days for the RLE cellulitis which has improved.  Due to weakness he was admitted to [facility] for therapy.   He was seen in the ED on 7/31 due to a fall with a head injury and scalp laceration. He was lightheaded when standing at that time but did not have a seizure or LOC . CT of the head showed no acute bleed. CXR was done showing possible pna. CT of the chest was done which showed no focal consolidation. Right thyroid had a hypodense lesion 2.2 x 1.6 cm  Thyroid US was done which showed a solitary, cystic right inferior thyroid nodule that does not meet criteria for surveillance. TSH is 2.685  Update 01/31/22 He has worked with therapy and has made gains. He remains a fall risk which is an long term issue.  He feels stronger and more mentally clear and is requesting to discharge. He left the unit and went over to [place] even though he was told to take someone with him. He is eating and drinking well. He reports urinary frequency but no pain or burning. Feels he is a little swollen. BP has been up 120-170s. On 2 grams of NA bid.  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[place])    Urinary urgency    Past Surgical History:  Procedure Laterality Date   CARDIAC DEFIBRILLATOR PLACEMENT      cataract surgery     COLONOSCOPY     CRANIOTOMY Right    right anterior temporal resection   defibrillator replaced  March 2016   IMPLANTATION VAGAL NERVE STIMULATOR  June 2016   NASAL SINUS SURGERY     TONSILLECTOMY     age 12   VASECTOMY  4    Allergies  Allergen Reactions   Depakote [Divalproex Sodium] Other (See Comments)    Arthralgias     Outpatient Encounter Medications as of 01/31/2022  Medication Sig   acetaminophen (TYLENOL) 325 MG tablet Take 650 mg by mouth every 6 (six) hours as needed for mild pain or moderate pain.   ALPRAZolam (XANAX) 0.25 MG tablet Take 1 tablet (0.25 mg total) by mouth 2 (two) times daily.   brimonidine-timolol (COMBIGAN) 0.2-0.5 % ophthalmic solution Place 1 drop into both eyes every 12 (twelve) hours.    calcium carbonate (TUMS SMOOTHIES) 750 MG chewable tablet Chew 2 tablets by mouth 4 (four) times daily as needed for heartburn.   cholecalciferol (VITAMIN D) 400 units TABS tablet Take 400 Units by mouth 2 (two) times daily.   clobetasol cream (TEMOVATE) 8.18 % Apply 1 application  topically 2 (two) times daily as needed (rash).   econazole nitrate 1 % cream Apply 1 Application topically 2 (two) times daily.   finasteride (PROSCAR) 5 MG tablet Take 5 mg by mouth every morning.    flecainide (TAMBOCOR) 50 MG tablet Take 50 mg by mouth 2 (two) times daily.   fluocinonide ointment (LIDEX) 5.63 % Apply 1 Application topically 2 (two) times daily as needed (rash).   lacosamide (VIMPAT) 200 MG TABS tablet Take 1 tablet (200 mg total) by mouth 2 (two) times daily.   latanoprost (XALATAN) 0.005 % ophthalmic solution Place 1 drop into both eyes at bedtime.   Levetiracetam 750 MG TB24 Take 2,250 mg by mouth daily.   LORazepam (ATIVAN) 2 MG/ML concentrated solution Take 2 mg by mouth as needed for anxiety or seizure. MAR instructions:If seizure activity, given '2mg'$  IM.If continued seizure activity after 50mn give Ativan '2mg'$  IM again.   miconazole  (MICOTIN) 2 % powder Apply topically 2 (two) times daily.   phenytoin (DILANTIN) 50 MG tablet Chew 75 mg by mouth daily.   PHENYTOIN SODIUM EXTENDED PO Take 300 mg by mouth daily. In the evening.   polyethylene glycol (MIRALAX / GLYCOLAX) packet Take 17 g by mouth every other day.   sodium chloride 1 g tablet Take 2 tablets (2 g total) by mouth 2 (two) times daily with a meal.   tamsulosin (FLOMAX) 0.4 MG CAPS capsule Take 0.4 mg by mouth daily.   triamcinolone cream (KENALOG) 0.1 % Apply 1 Application topically 2 (two) times daily as needed (flares).   warfarin (COUMADIN) 2.5 MG tablet Take 2.5 mg by mouth daily.   No facility-administered encounter medications on file as of 01/31/2022.    Review of Systems  Constitutional:  Negative for activity change, appetite change, chills, diaphoresis, fatigue, fever and unexpected weight change.  HENT:  Negative for congestion.   Respiratory:  Negative for cough, shortness of breath, wheezing and stridor.   Cardiovascular:  Positive for leg swelling. Negative for chest pain and palpitations.  Gastrointestinal:  Negative for abdominal distention, abdominal pain, constipation and diarrhea.  Genitourinary:  Negative for difficulty urinating, dysuria, flank pain and urgency.  Musculoskeletal:  Positive for gait problem. Negative for arthralgias, back pain, joint swelling and myalgias.  Skin:  Positive for rash.  Neurological:  Positive for tremors. Negative for dizziness, seizures, syncope, facial asymmetry, speech difficulty, weakness and headaches.  Hematological:  Negative for adenopathy. Does not bruise/bleed easily.  Psychiatric/Behavioral:  Negative for agitation, behavioral problems and confusion.        Short term memory loss    Immunization History  Administered Date(s) Administered   Influenza, High Dose Seasonal PF 04/07/2020   Influenza, Seasonal, Injecte, Preservative Fre 02/22/2010   Influenza,inj,Quad PF,6+ Mos 04/07/2018, 04/15/2019    Influenza-Unspecified 03/17/2013, 02/24/2014, 02/16/2015, 03/21/2016, 03/17/2017, 04/07/2018, 03/21/2021   Moderna SARS-COV2 Booster Vaccination 03/28/2021   Moderna Sars-Covid-2 Vaccination 06/28/2019, 07/27/2019, 05/01/2020   Pneumococcal Conjugate-13 07/05/2013   Pneumococcal Polysaccharide-23 06/17/2006   Td 06/18/2007   Tdap 12/07/2015, 09/17/2020   Zoster Recombinat (Shingrix) 12/04/2016   Zoster, Live 10/06/2008   Pertinent  Health Maintenance Due  Topic Date Due   INFLUENZA VACCINE  01/15/2022      01/25/2022    9:00 AM 01/25/2022    7:41 PM 01/26/2022    9:30 AM 01/26/2022    7:59 PM 01/27/2022    8:00 AM  Fall Risk  Patient Fall Risk Level High fall risk High fall risk High fall risk High fall risk High fall risk   Functional Status Survey:    Vitals:   01/31/22 1121  BP: (!) 172/85  Pulse: 60  Resp: 14  Temp: 97.9 F (36.6 C)  TempSrc: Skin  SpO2: 95%  Weight: 171 lb 12.8 oz (77.9 kg)  Height: '5\' 5"'$  (1.651 m)   Body mass index is 28.59 kg/m. Physical Exam Vitals and nursing note reviewed.  Constitutional:      General: He is not in acute distress.    Appearance: He is not diaphoretic.  HENT:     Head: Normocephalic and atraumatic.  Neck:     Thyroid: No thyromegaly.     Vascular: No JVD.     Trachea: No tracheal deviation.  Cardiovascular:     Rate and Rhythm: Normal rate and regular rhythm.     Heart sounds: No murmur heard. Pulmonary:     Effort: Pulmonary effort is normal. No respiratory distress.     Breath sounds: Normal breath sounds. No wheezing.  Abdominal:     General: Bowel sounds are normal. There is no distension.     Palpations: Abdomen is soft.     Tenderness: There is no abdominal tenderness.  Musculoskeletal:     Right lower leg: Edema (+1) present.     Comments: Mild erythema to RLE. NO warmth or drainage.   Lymphadenopathy:     Cervical: No cervical adenopathy.  Skin:    General: Skin is warm and dry.     Findings: Rash  (chronic maculopapular rash to legs, arms trunk) present.  Neurological:     Mental Status: He is alert and oriented to person, place, and time.     Cranial Nerves: No cranial nerve deficit.  Labs reviewed: Recent Labs    01/25/22 0647 01/26/22 0547 01/27/22 0333  NA 123* 129* 131*  K 4.5 4.5 4.7  CL 95* 99 104  CO2 21* 23 17*  GLUCOSE 95 100* 108*  BUN '9 10 17  '$ CREATININE 0.60* 0.64 0.76  CALCIUM 8.5* 8.7* 7.6*   No results for input(s): "AST", "ALT", "ALKPHOS", "BILITOT", "PROT", "ALBUMIN" in the last 8760 hours. Recent Labs    01/19/22 1139 01/19/22 1143 01/20/22 0250 01/21/22 0844  WBC 5.3  --  4.1 4.3  NEUTROABS 2.4  --   --   --   HGB 11.6* 11.2* 11.7* 11.6*  HCT 32.2* 33.0* 31.7* 31.5*  MCV 90.4  --  91.9 88.5  PLT 234  --  213 236   Lab Results  Component Value Date   TSH 2.685 01/19/2022   No results found for: "HGBA1C" Lab Results  Component Value Date   CHOL 214 (A) 05/15/2021   HDL 61 05/15/2021   LDLCALC 141 05/15/2021   TRIG 79 05/15/2021    Significant Diagnostic Results in last 30 days:  US THYROID  Result Date: 01/21/2022 CLINICAL DATA:  Thyroid nodule EXAM: THYROID ULTRASOUND TECHNIQUE: Ultrasound examination of the thyroid gland and adjacent soft tissues was performed. COMPARISON:  CT chest without contrast 01/14/2022 FINDINGS: Parenchymal Echotexture: Mildly heterogenous Isthmus: 0.4 cm Right lobe: 2.2 x 1.1 x 1.4 cm Left lobe: 1.4 x 0.8 x 0.9 _________________________________________________________ Estimated total number of nodules >/= 1 cm: 1 Number of spongiform nodules >/=  2 cm not described below (TR1): 0 Number of mixed cystic and solid nodules >/= 1.5 cm not described below (TR2): 0 _________________________________________________________ 2.3 cm cystic right inferior thyroid nodule corresponds to the abnormality seen on recent chest CT, and does not meet criteria for imaging surveillance or FNA. IMPRESSION: Solitary, cystic right  inferior thyroid nodule does not meet criteria for imaging surveillance or FNA. The above is in keeping with the ACR TI-RADS recommendations - J Am Coll Radiol 2017;14:587-595. Electronically Signed   By: Miachel Roux M.D.   On: 01/21/2022 08:07   CT Chest Wo Contrast  Result Date: 01/14/2022 CLINICAL DATA:  Pneumonia, complication suspected, xray done EXAM: CT CHEST WITHOUT CONTRAST TECHNIQUE: Multidetector CT imaging of the chest was performed following the standard protocol without IV contrast. RADIATION DOSE REDUCTION: This exam was performed according to the departmental dose-optimization program which includes automated exposure control, adjustment of the mA and/or kV according to patient size and/or use of iterative reconstruction technique. COMPARISON:  Correlation is made with a chest x-ray done on the same day earlier FINDINGS: Cardiovascular: Mild atheromatous calcifications of the thoracic aorta. No aneurysm. Severe atheromatous no significant vascular findings. Normal heart size. No pericardial effusion. Mediastinum/Nodes: Hypodense lesion in the right thyroid lobe measuring 2.2 x 1.6 cm. Left thyroid lobe has a normal appearance. No mediastinal mass or significant lymphadenopathy. No significant axillary lymphadenopathy. Small hiatal hernia. Lungs/Pleura: No focal consolidation, pleural effusion or vascular congestion. There are some interstitial changes seen with a subpleural reticulations at the bibasilar regions. There is band of chronic atelectasis/pulmonary scarring seen at the left lung base. Upper Abdomen: No acute abnormality. Musculoskeletal: Again seen is the left subclavian pacer defibrillator. There is a nerve stimulator at the right side of the chest. Mild-to-moderate spondylosis of the cervical and thoracic spine. IMPRESSION: 1. No focal consolidation, pleural effusion or vascular congestion. Interstitial changes at the lung bases. There is a band of atelectasis/scarring at the left  lung base.  2. Mild cardiomegaly. Severe atheromatous calcifications of the coronary arteries. 3.  2.2 x 1.6 cm hypodense lesion in the right thyroid lobe. Pathology: 2.2 cm incidental right thyroid nodule. Recommend thyroid US.Reference: J Am Coll Radiol. 2015 Feb;12(2): 143-50 Electronically Signed   By: Frazier Richards M.D.   On: 01/14/2022 10:47   CT HEAD WO CONTRAST (5MM)  Result Date: 01/14/2022 CLINICAL DATA:  Head trauma.  Fall on Coumadin. EXAM: CT HEAD WITHOUT CONTRAST TECHNIQUE: Contiguous axial images were obtained from the base of the skull through the vertex without intravenous contrast. RADIATION DOSE REDUCTION: This exam was performed according to the departmental dose-optimization program which includes automated exposure control, adjustment of the mA and/or kV according to patient size and/or use of iterative reconstruction technique. COMPARISON:  Head CT 01/29/2019 FINDINGS: Brain: There is no evidence of an acute infarct, intracranial hemorrhage, mass, midline shift, or extra-axial fluid collection. Sequelae of anterior right temporal lobectomy are again identified. There is an unchanged small chronic infarct involving the right cingulate gyrus. Hypodensities elsewhere in the cerebral white matter bilaterally are unchanged and nonspecific but compatible with mild chronic small vessel ischemic disease. There is mild global cerebral atrophy. Vascular: Calcified atherosclerosis at the skull base. No hyperdense vessel. Skull: Right pterional craniotomy.  No acute fracture. Sinuses/Orbits: Mucosal thickening throughout the paranasal sinuses including subtotal opacification of the right maxillary sinus by circumferential mucosal thickening. Bubbly secretions in the left maxillary sinus. Small, chronic left mastoid effusion. Bilateral cataract extraction. Other: Mild right occipital scalp soft tissue swelling. IMPRESSION: 1. No evidence of acute intracranial abnormality. 2. Mild right occipital scalp  soft tissue swelling. 3. Chronic brain findings as above. Electronically Signed   By: Logan Bores M.D.   On: 01/14/2022 10:36   DG Chest Portable 1 View  Result Date: 01/14/2022 CLINICAL DATA:  86 year old male presenting for evaluation of fall, level 2 trauma with history of blood thinners EXAM: PORTABLE CHEST 1 VIEW COMPARISON:  September 03, 2018. FINDINGS: Nerve stimulator and cardiac pacer defibrillator in place over the chest. Leads extending cephalad from nerve stimulator. Dual lead pacer defibrillator with similar appearance to prior imaging. EKG leads project over the chest. Trachea midline. Cardiomediastinal contours and hilar structures are stable accounting for portable technique and AP projection. Subtle interstitial opacities and patchy opacities in the chest greatest in the LEFT mid and lower chest. No visible pneumothorax. No lobar consolidation. No gross pleural effusion on frontal radiograph. On limited assessment no acute skeletal findings. IMPRESSION: Subtle interstitial opacities and patchy opacities in the chest greatest in the LEFT mid and lower chest. Findings may represent asymmetric edema or atypical infection. Electronically Signed   By: Zetta Bills M.D.   On: 01/14/2022 09:45    Assessment/Plan  1. Hyponatremia Move labs up to am Conitnue Fr 1500cc If possible would like to reduce sodium tablets due to edema and HTN   2. Weakness Has improved but remains a fall risk which is not new Should have his walker with him at all times Ok to return to [place]  3. Cellulitis of right lower extremity Improving Complete 10 day course of Keflex   4. Chronic atrial fibrillation Rate is controlled Lab Results  Component Value Date   INR 2.4 (H) 01/27/2022   INR 2.1 (H) 01/25/2022   INR 2.1 (H) 01/24/2022   PROTIME 21.7 (A) 11/16/2021   PROTIME 25.2 (A) 08/08/2020   PROTIME 25.5 (A) 11/24/2018    On coumadin Fall risk, going to see cardiology to  evaluate risk vs benefit  (left message with nurse regarding ensuring apt)  5. Parkinsonian syndrome associated with symptomatic orthostatic hypotension ([facility]) Rise slowly Compression hose  6. Thyroid nodule No further work up needed   7. Seizure disorder ([place]) Followed by neurology  8. Grover's disease Recommend applying clobetasol to legs which is already ordered prn discussed with nurse.   9. Tinea pedis of both feet Continue econazole   10. Mild cognitive impairment Worsened with low sodium, now back to baseline  11. Urinary frequency with hx of BPH On Flomax and proscar Possibly due to edema Check PVR bladder scan  Family/ staff Communication: resident   Labs/tests ordered:  CBC BMP dilantin level in am.   Addendum: Sodium returned 02/01/22 at 136. He is having edema and HTN. Will reduce Sodium tablets to 1 gram bid and repeat BMP in 1 week. He has improved significantly.  Discharge review, assessment, plan, and coordination took >30 min

## 2022-02-01 DIAGNOSIS — R2689 Other abnormalities of gait and mobility: Secondary | ICD-10-CM | POA: Diagnosis not present

## 2022-02-01 DIAGNOSIS — R278 Other lack of coordination: Secondary | ICD-10-CM | POA: Diagnosis not present

## 2022-02-01 DIAGNOSIS — G20 Parkinson's disease: Secondary | ICD-10-CM | POA: Diagnosis not present

## 2022-02-01 DIAGNOSIS — E871 Hypo-osmolality and hyponatremia: Secondary | ICD-10-CM | POA: Diagnosis not present

## 2022-02-01 DIAGNOSIS — G9341 Metabolic encephalopathy: Secondary | ICD-10-CM | POA: Diagnosis not present

## 2022-02-01 DIAGNOSIS — G40211 Localization-related (focal) (partial) symptomatic epilepsy and epileptic syndromes with complex partial seizures, intractable, with status epilepticus: Secondary | ICD-10-CM | POA: Diagnosis not present

## 2022-02-01 DIAGNOSIS — M6289 Other specified disorders of muscle: Secondary | ICD-10-CM | POA: Diagnosis not present

## 2022-02-01 DIAGNOSIS — Z9181 History of falling: Secondary | ICD-10-CM | POA: Diagnosis not present

## 2022-02-01 DIAGNOSIS — L03116 Cellulitis of left lower limb: Secondary | ICD-10-CM | POA: Diagnosis not present

## 2022-02-01 LAB — COMPREHENSIVE METABOLIC PANEL
Calcium: 9 (ref 8.7–10.7)
eGFR: >90

## 2022-02-01 LAB — BASIC METABOLIC PANEL
BUN: 13 (ref 4–21)
CO2: 23 — AB (ref 13–22)
Chloride: 99 (ref 99–108)
Creatinine: 0.7 (ref 0.6–1.3)
Glucose: 97
Potassium: 4.5 mEq/L (ref 3.5–5.1)
Sodium: 136 — AB (ref 137–147)

## 2022-02-01 LAB — CBC AND DIFFERENTIAL
HCT: 35 — AB (ref 41–53)
Hemoglobin: 12.1 — AB (ref 13.5–17.5)
Platelets: 242 10*3/uL (ref 150–400)
WBC: 7.4

## 2022-02-01 LAB — CBC: RBC: 3.61 — AB (ref 3.87–5.11)

## 2022-02-01 MED ORDER — SODIUM CHLORIDE 1 G PO TABS
1.0000 g | ORAL_TABLET | Freq: Two times a day (BID) | ORAL | Status: DC
Start: 2022-02-01 — End: 2022-08-26

## 2022-02-01 NOTE — Addendum Note (Signed)
Addended by: Barnie Mort on: 02/01/2022 03:47 PM   Modules accepted: Orders

## 2022-02-02 DIAGNOSIS — R0989 Other specified symptoms and signs involving the circulatory and respiratory systems: Secondary | ICD-10-CM | POA: Diagnosis not present

## 2022-02-04 ENCOUNTER — Non-Acute Institutional Stay: Payer: Medicare Other | Admitting: Internal Medicine

## 2022-02-04 ENCOUNTER — Encounter: Payer: Self-pay | Admitting: Internal Medicine

## 2022-02-04 DIAGNOSIS — G40909 Epilepsy, unspecified, not intractable, without status epilepticus: Secondary | ICD-10-CM | POA: Diagnosis not present

## 2022-02-04 DIAGNOSIS — G9341 Metabolic encephalopathy: Secondary | ICD-10-CM | POA: Diagnosis not present

## 2022-02-04 DIAGNOSIS — R350 Frequency of micturition: Secondary | ICD-10-CM | POA: Diagnosis not present

## 2022-02-04 DIAGNOSIS — G20 Parkinson's disease: Secondary | ICD-10-CM | POA: Diagnosis not present

## 2022-02-04 DIAGNOSIS — N401 Enlarged prostate with lower urinary tract symptoms: Secondary | ICD-10-CM | POA: Diagnosis not present

## 2022-02-04 DIAGNOSIS — R531 Weakness: Secondary | ICD-10-CM

## 2022-02-04 DIAGNOSIS — I951 Orthostatic hypotension: Secondary | ICD-10-CM

## 2022-02-04 DIAGNOSIS — E871 Hypo-osmolality and hyponatremia: Secondary | ICD-10-CM

## 2022-02-04 DIAGNOSIS — I482 Chronic atrial fibrillation, unspecified: Secondary | ICD-10-CM

## 2022-02-04 DIAGNOSIS — R2689 Other abnormalities of gait and mobility: Secondary | ICD-10-CM | POA: Diagnosis not present

## 2022-02-04 DIAGNOSIS — Z9181 History of falling: Secondary | ICD-10-CM | POA: Diagnosis not present

## 2022-02-04 DIAGNOSIS — R278 Other lack of coordination: Secondary | ICD-10-CM | POA: Diagnosis not present

## 2022-02-04 NOTE — Progress Notes (Unsigned)
Provider:  Dr. Veleta Miners Location:  [place] Room Number: [place] 536 Place of Service:  SNF (31)  PCP: [name], MD Patient Care Team: [name], MD as PCP - General (Internal Medicine) Rutherford Guys, MD as Consulting Physician (Ophthalmology) Kathrynn Ducking, MD (Inactive) as Consulting Physician (Neurology) Gayland Curry, DO (Geriatric Medicine)  Extended Emergency Contact Information Primary Emergency Contact: Luhman,[name] Address: [address]          [place] [address] Johnnette Litter of [place] Phone: [phone] Mobile Phone: [phone] Relation: Spouse Secondary Emergency Contact: Barba,Akia Mobile Phone: [phone] Relation: Son  Code Status: DNR Goals of Care: Advanced Directive information    01/28/2022   10:10 AM  Advanced Directives  Does Patient Have a Medical Advance Directive? Yes  Type of Advance Directive Out of facility DNR (pink MOST or yellow form)  Does patient want to make changes to medical advance directive? No - Patient declined  Pre-existing out of facility DNR order (yellow form or pink MOST form) Pink MOST form placed in chart (order not valid for inpatient use);Yellow form placed in chart (order not valid for inpatient use)      Chief Complaint  Patient presents with   Acute Visit    HPI: Patient is a 86 y.o. male seen today for Episode of Weakness and Confusion this morning Patient was admitted in the hospital from 8/05-8/13 for acute on chronic hyponatremia and cellulitis His work-up of hyponatremia Pointed to SIADH as most likely cause He was started on 4 tabs of Sodium eventually his sodium came back normal And since his blood pressure was high and he was having some edema still reduced sodium to 1 tablet twice daily He was discharged to [place]      Patient has a history of refractive seizure disorder for 30 years Is on multiple medications follow closely with  neurology had seen a number of specialist has had surgery done and now also has a vagal stimulator.  Also gets as needed lorazepam .  His seizure are complex including hallucinations.  He states that he knows they are coming.  He had a seizure on 07/29 responded well to IM lorazepam Hyponatremia History of chronic A. fib s/p ICD defibrillator On chronic Coumadin and flecainide.  Patient has not seen a cardiologist in [facility] Per Dr. Cyndi Lennert note.  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 He has never used smokeless tobacco. He reports that he does not currently use alcohol. He reports that he does not use drugs. Social History   Socioeconomic History   Marital status: Married    Spouse name: Opal Sidles   Number of children: 3   Years of education: MBA   Highest education level: Not on file  Occupational History   Occupation: Retired   Occupation: Charity fundraiser  Tobacco Use   Smoking status: Never   Smokeless tobacco: Never   Tobacco comments:    quit 1960s  Vaping Use   Vaping Use: Never used  Substance and Sexual Activity    Alcohol use: Not Currently    Comment: former beer drinker   Drug use: No   Sexual activity: Not Currently  Other Topics Concern   Not on file  Social History Narrative   Lives Assisted Living, [facility]   Right Handed   Drinks 2-3 cups caffeine daily   Social Determinants of Health   Financial Resource Strain: Low Risk  (04/15/2018)   Overall Financial Resource Strain (CARDIA)    Difficulty of Paying Living Expenses: Not hard at all  Food Insecurity: No Food Insecurity (04/15/2018)   Hunger Vital Sign    Worried About Running Out of Food in the Last Year: Never true    [place] in the Last Year: Never true  Transportation Needs: No Transportation Needs (04/15/2018)   PRAPARE - Hydrologist (Medical): No    Lack of Transportation (Non-Medical): No  Physical Activity: Sufficiently Active (04/15/2018)   Exercise Vital Sign    Days of Exercise per Week: 5 days    Minutes of Exercise per Session: 30 min  Stress: No Stress Concern Present (04/15/2018)   [place]    Feeling of Stress : Not at all  Social Connections: Somewhat Isolated (04/15/2018)   Social Connection and Isolation Panel [NHANES]    Frequency of Communication with Friends and Family: Three times a week    Frequency of Social Gatherings with Friends and Family: Three times a week    Attends Religious Services: Never    Active Member of Clubs or Organizations: No    Attends Archivist Meetings: Never    Marital Status: Married  Human resources officer Violence: Not At Risk (04/15/2018)   Humiliation, Afraid, Rape, and Kick questionnaire    Fear of Current or Ex-Partner: No    Emotionally Abused: No    Physically Abused: No    Sexually Abused: No    Functional Status Survey:    Family History  Problem Relation Age of Onset   Cancer - Lung Mother    Cancer - Prostate Father     Health Maintenance   Topic Date Due   Zoster Vaccines- Shingrix (2 of 2) 01/29/2017   COVID-19 Vaccine (4 - Moderna risk series) 05/23/2021   INFLUENZA VACCINE  01/15/2022   TETANUS/TDAP  09/18/2030   Pneumonia Vaccine 66+ Years old  Completed   HPV VACCINES  Aged Out    Allergies  Allergen Reactions   Depakote [Divalproex Sodium] Other (See Comments)    Arthralgias     Outpatient Encounter Medications as of 02/04/2022  Medication Sig   acetaminophen (TYLENOL) 325 MG tablet Take 650 mg by mouth every 6 (six) hours as needed for mild pain or moderate pain.   ALPRAZolam (XANAX) 0.25 MG tablet Take  1 tablet (0.25 mg total) by mouth 2 (two) times daily.   brimonidine-timolol (COMBIGAN) 0.2-0.5 % ophthalmic solution Place 1 drop into both eyes every 12 (twelve) hours.    calcium carbonate (TUMS SMOOTHIES) 750 MG chewable tablet Chew 2 tablets by mouth 4 (four) times daily as needed for heartburn.   cholecalciferol (VITAMIN D) 400 units TABS tablet Take 400 Units by mouth 2 (two) times daily.   clobetasol cream (TEMOVATE) 9.98 % Apply 1 application  topically 2 (two) times daily as needed (rash).   econazole nitrate 1 % cream Apply 1 Application topically 2 (two) times daily.   finasteride (PROSCAR) 5 MG tablet Take 5 mg by mouth every morning.    flecainide (TAMBOCOR) 50 MG tablet Take 50 mg by mouth 2 (two) times daily.   fluocinonide ointment (LIDEX) 3.38 % Apply 1 Application topically 2 (two) times daily as needed (rash).   lacosamide (VIMPAT) 200 MG TABS tablet Take 1 tablet (200 mg total) by mouth 2 (two) times daily.   latanoprost (XALATAN) 0.005 % ophthalmic solution Place 1 drop into both eyes at bedtime.   Levetiracetam 750 MG TB24 Take 2,250 mg by mouth daily.   LORazepam (ATIVAN) 2 MG/ML concentrated solution Take 2 mg by mouth as needed for anxiety or seizure. MAR instructions:If seizure activity, given '2mg'$  IM.If continued seizure activity after 52mn give Ativan '2mg'$  IM again.   miconazole  (MICOTIN) 2 % powder Apply topically 2 (two) times daily.   phenytoin (DILANTIN) 50 MG tablet Chew 75 mg by mouth daily.   PHENYTOIN SODIUM EXTENDED PO Take 300 mg by mouth daily. In the evening.   polyethylene glycol (MIRALAX / GLYCOLAX) packet Take 17 g by mouth every other day.   sodium chloride 1 g tablet Take 1 tablet (1 g total) by mouth 2 (two) times daily with a meal.   tamsulosin (FLOMAX) 0.4 MG CAPS capsule Take 0.4 mg by mouth daily.   triamcinolone cream (KENALOG) 0.1 % Apply 1 Application topically 2 (two) times daily as needed (flares).   warfarin (COUMADIN) 2.5 MG tablet Take 2.5 mg by mouth daily.   No facility-administered encounter medications on file as of 02/04/2022.    Review of Systems  Vitals:   02/04/22 1000  BP: (!) 156/88  Pulse: 64  Resp: 18  Temp: (!) 97.3 F (36.3 C)  TempSrc: Skin  SpO2: 96%  Weight: 171 lb 12.8 oz (77.9 kg)  Height: '5\' 5"'$  (1.651 m)   Body mass index is 28.59 kg/m. Physical Exam Vitals reviewed.  Constitutional:      Appearance: Normal appearance.  HENT:     Head: Normocephalic.     Nose: Nose normal.     Mouth/Throat:     Mouth: Mucous membranes are moist.     Pharynx: Oropharynx is clear.  Eyes:     Pupils: Pupils are equal, round, and reactive to light.  Cardiovascular:     Rate and Rhythm: Normal rate and regular rhythm.     Pulses: Normal pulses.     Heart sounds: No murmur heard. Pulmonary:     Effort: Pulmonary effort is normal. No respiratory distress.     Breath sounds: Normal breath sounds. No rales.  Abdominal:     General: Abdomen is flat. Bowel sounds are normal.     Palpations: Abdomen is soft.  Musculoskeletal:        General: Swelling present.     Cervical back: Neck supple.     Comments: Mild edema  Bilateral with Chronic Venous changes  Skin:    General: Skin is warm.  Neurological:     General: No focal deficit present.     Mental Status: He is alert and oriented to person, place, and time.   Psychiatric:        Mood and Affect: Mood normal.        Thought Content: Thought content normal.     Labs reviewed: Basic Metabolic Panel: Recent Labs    01/25/22 0647 01/26/22 0547 01/27/22 0333  NA 123* 129* 131*  K 4.5 4.5 4.7  CL 95* 99 104  CO2 21* 23 17*  GLUCOSE 95 100* 108*  BUN '9 10 17  '$ CREATININE 0.60* 0.64 0.76  CALCIUM 8.5* 8.7* 7.6*   Liver Function Tests: No results for input(s): "AST", "ALT", "ALKPHOS", "BILITOT", "PROT", "ALBUMIN" in the last 8760 hours. No results for input(s): "LIPASE", "AMYLASE" in the last 8760 hours. No results for input(s): "AMMONIA" in the last 8760 hours. CBC: Recent Labs    01/19/22 1139 01/19/22 1143 01/20/22 0250 01/21/22 0844  WBC 5.3  --  4.1 4.3  NEUTROABS 2.4  --   --   --   HGB 11.6* 11.2* 11.7* 11.6*  HCT 32.2* 33.0* 31.7* 31.5*  MCV 90.4  --  91.9 88.5  PLT 234  --  213 236   Cardiac Enzymes: No results for input(s): "CKTOTAL", "CKMB", "CKMBINDEX", "TROPONINI" in the last 8760 hours. BNP: Invalid input(s): "POCBNP" No results found for: "HGBA1C" Lab Results  Component Value Date   TSH 2.685 01/19/2022   Lab Results  Component Value Date   YNWGNFAO13 086 03/15/2021   No results found for: "FOLATE" No results found for: "IRON", "TIBC", "FERRITIN"  Imaging and Procedures obtained prior to SNF admission: US THYROID  Result Date: 01/21/2022 CLINICAL DATA:  Thyroid nodule EXAM: THYROID ULTRASOUND TECHNIQUE: Ultrasound examination of the thyroid gland and adjacent soft tissues was performed. COMPARISON:  CT chest without contrast 01/14/2022 FINDINGS: Parenchymal Echotexture: Mildly heterogenous Isthmus: 0.4 cm Right lobe: 2.2 x 1.1 x 1.4 cm Left lobe: 1.4 x 0.8 x 0.9 _________________________________________________________ Estimated total number of nodules >/= 1 cm: 1 Number of spongiform nodules >/=  2 cm not described below (TR1): 0 Number of mixed cystic and solid nodules >/= 1.5 cm not described below (TR2):  0 _________________________________________________________ 2.3 cm cystic right inferior thyroid nodule corresponds to the abnormality seen on recent chest CT, and does not meet criteria for imaging surveillance or FNA. IMPRESSION: Solitary, cystic right inferior thyroid nodule does not meet criteria for imaging surveillance or FNA. The above is in keeping with the ACR TI-RADS recommendations - J Am Coll Radiol 2017;14:587-595. Electronically Signed   By: Miachel Roux M.D.   On: 01/21/2022 08:07    Assessment/Plan 1. Hyponatremia Repeat BMP will be done in few days Last NA was 136 He is on Sodium 1 gm BID  2. Weakness At baseline now ? Etiology Will Check BMP and Vimpat and Dilantin and Keppra levels   3. Chronic atrial fibrillation On Coumadin Also on Flecainide  Needs appointment with his Cardiology for Follow up and Discuss risk and Benefit of continuing Coumadin due to his recurent falls and seizures  4. Seizure disorder ([place]) On Number of Meds Vimpat increased in 03/23  5. Benign prostatic hyperplasia with urinary frequency On Flomax   6. Parkinsonian syndrome associated with symptomatic orthostatic hypotension (HCC) Not progressive per Dr Jannifer Franklin Not on any meds  7 Anxiety Has been on  Xanax for long time  8 Recurent Falls Refusing Therapy  [place] Disease On Kenalog ICD (implantable cardioverter-defibrillator) in place Referal To  Cardiology Family/ staff Communication:   Labs/tests ordered: Vimpat ,Keppra, dilatnin

## 2022-02-05 DIAGNOSIS — Z85820 Personal history of malignant melanoma of skin: Secondary | ICD-10-CM | POA: Diagnosis not present

## 2022-02-05 DIAGNOSIS — E871 Hypo-osmolality and hyponatremia: Secondary | ICD-10-CM | POA: Diagnosis not present

## 2022-02-05 DIAGNOSIS — M6289 Other specified disorders of muscle: Secondary | ICD-10-CM | POA: Diagnosis not present

## 2022-02-05 DIAGNOSIS — L111 Transient acantholytic dermatosis [Grover]: Secondary | ICD-10-CM | POA: Diagnosis not present

## 2022-02-05 DIAGNOSIS — B353 Tinea pedis: Secondary | ICD-10-CM | POA: Diagnosis not present

## 2022-02-05 DIAGNOSIS — L570 Actinic keratosis: Secondary | ICD-10-CM | POA: Diagnosis not present

## 2022-02-05 DIAGNOSIS — L821 Other seborrheic keratosis: Secondary | ICD-10-CM | POA: Diagnosis not present

## 2022-02-05 DIAGNOSIS — L03116 Cellulitis of left lower limb: Secondary | ICD-10-CM | POA: Diagnosis not present

## 2022-02-06 DIAGNOSIS — G9341 Metabolic encephalopathy: Secondary | ICD-10-CM | POA: Diagnosis not present

## 2022-02-06 DIAGNOSIS — R278 Other lack of coordination: Secondary | ICD-10-CM | POA: Diagnosis not present

## 2022-02-06 DIAGNOSIS — Z9181 History of falling: Secondary | ICD-10-CM | POA: Diagnosis not present

## 2022-02-06 DIAGNOSIS — G20 Parkinson's disease: Secondary | ICD-10-CM | POA: Diagnosis not present

## 2022-02-06 DIAGNOSIS — L03116 Cellulitis of left lower limb: Secondary | ICD-10-CM | POA: Diagnosis not present

## 2022-02-06 DIAGNOSIS — E871 Hypo-osmolality and hyponatremia: Secondary | ICD-10-CM | POA: Diagnosis not present

## 2022-02-06 DIAGNOSIS — R2689 Other abnormalities of gait and mobility: Secondary | ICD-10-CM | POA: Diagnosis not present

## 2022-02-06 DIAGNOSIS — M6289 Other specified disorders of muscle: Secondary | ICD-10-CM | POA: Diagnosis not present

## 2022-02-06 DIAGNOSIS — N390 Urinary tract infection, site not specified: Secondary | ICD-10-CM | POA: Diagnosis not present

## 2022-02-07 DIAGNOSIS — G40911 Epilepsy, unspecified, intractable, with status epilepticus: Secondary | ICD-10-CM | POA: Diagnosis not present

## 2022-02-07 DIAGNOSIS — E871 Hypo-osmolality and hyponatremia: Secondary | ICD-10-CM | POA: Diagnosis not present

## 2022-02-07 DIAGNOSIS — R531 Weakness: Secondary | ICD-10-CM | POA: Diagnosis not present

## 2022-02-07 DIAGNOSIS — Z7901 Long term (current) use of anticoagulants: Secondary | ICD-10-CM | POA: Diagnosis not present

## 2022-02-07 LAB — COMPREHENSIVE METABOLIC PANEL
Calcium: 8.8 (ref 8.7–10.7)
eGFR: 89

## 2022-02-07 LAB — BASIC METABOLIC PANEL
BUN: 14 (ref 4–21)
CO2: 22 (ref 13–22)
Chloride: 100 (ref 99–108)
Creatinine: 0.8 (ref 0.6–1.3)
Glucose: 81
Potassium: 4.4 mEq/L (ref 3.5–5.1)
Sodium: 136 — AB (ref 137–147)

## 2022-02-08 DIAGNOSIS — E871 Hypo-osmolality and hyponatremia: Secondary | ICD-10-CM | POA: Diagnosis not present

## 2022-02-08 DIAGNOSIS — M6289 Other specified disorders of muscle: Secondary | ICD-10-CM | POA: Diagnosis not present

## 2022-02-08 DIAGNOSIS — L03116 Cellulitis of left lower limb: Secondary | ICD-10-CM | POA: Diagnosis not present

## 2022-02-11 ENCOUNTER — Encounter: Payer: Self-pay | Admitting: Neurology

## 2022-02-11 ENCOUNTER — Encounter: Payer: Self-pay | Admitting: Internal Medicine

## 2022-02-11 ENCOUNTER — Ambulatory Visit (INDEPENDENT_AMBULATORY_CARE_PROVIDER_SITE_OTHER): Payer: Medicare Other | Admitting: Neurology

## 2022-02-11 VITALS — BP 161/69 | HR 61 | Ht 69.0 in | Wt 171.0 lb

## 2022-02-11 DIAGNOSIS — G40919 Epilepsy, unspecified, intractable, without status epilepticus: Secondary | ICD-10-CM

## 2022-02-11 DIAGNOSIS — R278 Other lack of coordination: Secondary | ICD-10-CM | POA: Diagnosis not present

## 2022-02-11 DIAGNOSIS — R2689 Other abnormalities of gait and mobility: Secondary | ICD-10-CM | POA: Diagnosis not present

## 2022-02-11 DIAGNOSIS — G40211 Localization-related (focal) (partial) symptomatic epilepsy and epileptic syndromes with complex partial seizures, intractable, with status epilepticus: Secondary | ICD-10-CM | POA: Diagnosis not present

## 2022-02-11 DIAGNOSIS — G20 Parkinson's disease: Secondary | ICD-10-CM | POA: Diagnosis not present

## 2022-02-11 DIAGNOSIS — Z9181 History of falling: Secondary | ICD-10-CM | POA: Diagnosis not present

## 2022-02-11 DIAGNOSIS — R269 Unspecified abnormalities of gait and mobility: Secondary | ICD-10-CM

## 2022-02-11 DIAGNOSIS — G9341 Metabolic encephalopathy: Secondary | ICD-10-CM | POA: Diagnosis not present

## 2022-02-11 DIAGNOSIS — E871 Hypo-osmolality and hyponatremia: Secondary | ICD-10-CM | POA: Diagnosis not present

## 2022-02-11 NOTE — Patient Instructions (Signed)
Continue with Vimpat 200 mg twice daily Continue with Keppra 750 mg, 3 tablets daily Continue with phenytoin 75 mg in the morning and 300 mg at nighttime VNS setting changed, he tolerated procedure well Follow-up in 3 months at that time we will do additional VNS change.

## 2022-02-11 NOTE — Procedures (Signed)
    History: 86 year old man with intractable epilepsy    VAGAL NERVE STIMULATOR SETTINGS:  Output current: 1.357 milliamps  Signal frequency: 20 Hz  Pulse width: 250 microseconds  On time: 14 seconds  Off time: 1.1 minutes  Magnet current: 1.625 milliamps  Magnet on time: 60 seconds  Pulse width: 250 microseconds  Autostim current: 1.375 milliamps  Autostim pulse width: 250 microseconds  Autostim on time: 60 seconds   The VNS unit was interrogated, and the settings were changed as above. The patient appeared to tolerate the changes well.   Alric Ran, MD 02/11/2022, 1:24 PM  [facility] [address] [place], [place] [address] [phone]

## 2022-02-11 NOTE — Progress Notes (Signed)
Reason for visit: Intractable seizures, gait disorder  Cameron Potts is an 86 y.o. male  Interval History 02/11/2022:  Cameron Potts presents today for follow-up, he is accompanied by his wife. Since last visit in March, he has been doing well, denies any seizures, denies any side effect from the medications.  He still have balance issue and had fallen in the month of July.  He reported he bumped his head very slightly.  3 days later he started getting worse, having confusion and was taken to the ED. His head CT did not show any bleeding but his sodium was noted to be 122.  He does have chronic hyponatremia, he was seen by nephrology in the hospital.  He was told that hyponatremia was secondary to cerebral salt wasting.  He was treated and on discharge his sodium level was back to normal at 136.  In the hospital his antiseizure medications level were checked also and there were all within normal limits.  Since discharge from the hospital, he continues to do well.  He reported he is back to his normal self now.      Interval history 09/13/2021:  Patient presents today for follow-up, she is accompanied by his wife.  Last visit with Dr. Jannifer Franklin was in September, at that time plan was to continue current medications.  He is known to have breakthrough seizure, and wife report that the last breakthrough seizure was 6 weeks ago.  He does have Ativan currently at the facility and usually gets it as soon as the seizure started because he is known to go into status per wife.  Ativan use to stop the seizures   History of present illness: Cameron Potts is an 86 year old right-handed white male with a history of intractable seizures.  He currently is on Dilantin, Vimpat, and Keppra and he continues to have breakthrough seizures.  He last had a brief event that occurred 2 weeks ago.  He had a fall associated with this.  He had a another seizure about a month prior to this.  He has Ativan that he receives as  needed at his extended care facility.  His most recent Dilantin level on 12 March 2021 was 18.0.  The patient is also on Coumadin.  He walks with a walker.  He is getting physical therapy on a regular basis.  He has had a general decline in his balance according to his wife and according to the patient.  He has a vagal nerve stimulator in place, this was inserted in 2013.  He tolerates this fairly well.  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 He has never used smokeless tobacco. He reports that he does not currently use alcohol. He reports that he does not use drugs.    Allergies  Allergen Reactions   Depakote [Divalproex Sodium] Other (See Comments)    Arthralgias     Medications:  Current Meds  Medication Sig   acetaminophen (TYLENOL) 325 MG tablet Take 650 mg by mouth every 6 (six) hours as needed for mild pain or moderate pain.   ALPRAZolam (XANAX) 0.25 MG tablet Take 1 tablet (0.25 mg total) by mouth 2 (two) times daily.    brimonidine-timolol (COMBIGAN) 0.2-0.5 % ophthalmic solution Place 1 drop into both eyes every 12 (twelve) hours.    calcium carbonate (TUMS SMOOTHIES) 750 MG chewable tablet Chew 2 tablets by mouth 4 (four) times daily as needed for heartburn.   cholecalciferol (VITAMIN D) 400 units TABS tablet Take 400 Units by mouth 2 (two) times daily.   clobetasol cream (TEMOVATE) 7.00 % Apply 1 application  topically 2 (two) times daily as needed (rash).   econazole nitrate 1 % cream Apply 1 Application topically 2 (two) times daily.   finasteride (PROSCAR) 5 MG tablet Take 5 mg by mouth every morning.    flecainide (TAMBOCOR) 50 MG tablet Take 50 mg by mouth 2 (two) times daily.   fluocinonide ointment (LIDEX) 1.74 % Apply 1 Application topically 2 (two) times daily as needed (rash).   lacosamide (VIMPAT) 200 MG TABS tablet Take 1 tablet (200 mg total) by mouth 2 (two) times daily.   latanoprost (XALATAN) 0.005 % ophthalmic solution Place 1 drop into both eyes at bedtime.   Levetiracetam 750 MG TB24 Take 2,250 mg by mouth daily.   LORazepam (ATIVAN) 2 MG/ML concentrated solution Take 2 mg by mouth as needed for anxiety or seizure. MAR instructions:If seizure activity, given '2mg'$  IM.If continued seizure activity after 57mn give Ativan '2mg'$  IM again.   miconazole (MICOTIN) 2 % powder Apply topically 2 (two) times daily.   phenytoin (DILANTIN) 50 MG tablet Chew 75 mg by mouth daily.   PHENYTOIN SODIUM EXTENDED PO Take 300 mg by mouth daily. In the evening.   polyethylene glycol (MIRALAX / GLYCOLAX) packet Take 17 g by mouth every other day.   sodium chloride 1 g tablet Take 1 tablet (1 g total) by mouth 2 (two) times daily with a meal.   tamsulosin (FLOMAX) 0.4 MG CAPS capsule Take 0.4 mg by mouth daily.   triamcinolone cream (KENALOG) 0.1 % Apply 1 Application topically 2 (two) times daily as needed (flares).   warfarin (COUMADIN) 2.5 MG tablet Take 2.5 mg by mouth daily.      ROS: Out of a complete 14  system review of symptoms, the patient complains only of the following symptoms, and all other reviewed systems are negative.  Walking difficulty Seizures  Blood pressure (!) 161/69, pulse 61, height '5\' 9"'$  (1.753 m), weight 171 lb (77.6 kg).   Physical Exam  General: The patient is alert and cooperative at the time of the examination.  Skin: No significant peripheral edema is noted.   Neurologic Exam  Mental status: The patient is  alert and oriented x 3 at the time of the examination. The patient has apparent normal recent and remote memory, with an apparently normal attention span and concentration ability.  Cranial nerves: Facial symmetry is present. Speech is normal, no aphasia or dysarthria is noted. Extraocular movements are full. Visual fields are full. Decrease facial movements.   Motor: The patient has good strength in all 4 extremities.  Sensory examination: Soft touch sensation is symmetric on the face, arms, and legs.  Coordination: The patient has good finger-nose-finger and heel-to-shin bilaterally.  Gait and station: The patient has a somewhat wide-based gait, the patient walks with a walker.  With the walker, he has good stride and turns, good stability.  Tandem gait was not attempted.  Romberg is positive, he tends to fall backwards.  Reflexes: Deep tendon reflexes are symmetric.   Assessment/Plan:  1.  Intractable seizures  2.  Gait disorder  He is stable on VNS and 3 antiepileptic medications.  I interrogated his VNS and made some changes. He tolerated the procedure well. Continue with current medications. He continues to get physical therapy.  I will see him in 3 months for CNS adjustment.    Patient Instructions  Continue with Vimpat 200 mg twice daily Continue with Keppra 750 mg, 3 tablets daily Continue with phenytoin 75 mg in the morning and 300 mg at nighttime VNS setting changed, he tolerated procedure well Follow-up in 3 months at that time we  will do additional VNS change.   I have spent a total of 45 minutes dedicated to this patient today, preparing to see patient, performing a medically appropriate examination and evaluation, ordering tests and/or medications and procedures, and counseling and educating the patient/family/caregiver; independently interpreting result and communicating results to the family/patient/caregiver; and documenting clinical information in the electronic medical record.   Alric Ran, MD 02/11/2022 1:23 PM  [facility] [address] [place], [place] [address]  Phone [phone] Fax [phone]

## 2022-02-13 DIAGNOSIS — M6289 Other specified disorders of muscle: Secondary | ICD-10-CM | POA: Diagnosis not present

## 2022-02-13 DIAGNOSIS — L03116 Cellulitis of left lower limb: Secondary | ICD-10-CM | POA: Diagnosis not present

## 2022-02-13 DIAGNOSIS — E871 Hypo-osmolality and hyponatremia: Secondary | ICD-10-CM | POA: Diagnosis not present

## 2022-02-19 DIAGNOSIS — L03116 Cellulitis of left lower limb: Secondary | ICD-10-CM | POA: Diagnosis not present

## 2022-02-19 DIAGNOSIS — M6289 Other specified disorders of muscle: Secondary | ICD-10-CM | POA: Diagnosis not present

## 2022-02-19 DIAGNOSIS — E871 Hypo-osmolality and hyponatremia: Secondary | ICD-10-CM | POA: Diagnosis not present

## 2022-02-21 DIAGNOSIS — M6289 Other specified disorders of muscle: Secondary | ICD-10-CM | POA: Diagnosis not present

## 2022-02-21 DIAGNOSIS — E871 Hypo-osmolality and hyponatremia: Secondary | ICD-10-CM | POA: Diagnosis not present

## 2022-02-21 DIAGNOSIS — L03116 Cellulitis of left lower limb: Secondary | ICD-10-CM | POA: Diagnosis not present

## 2022-02-22 DIAGNOSIS — M6289 Other specified disorders of muscle: Secondary | ICD-10-CM | POA: Diagnosis not present

## 2022-02-22 DIAGNOSIS — E871 Hypo-osmolality and hyponatremia: Secondary | ICD-10-CM | POA: Diagnosis not present

## 2022-02-22 DIAGNOSIS — L03116 Cellulitis of left lower limb: Secondary | ICD-10-CM | POA: Diagnosis not present

## 2022-02-25 ENCOUNTER — Encounter: Payer: Self-pay | Admitting: Cardiovascular Disease

## 2022-02-25 ENCOUNTER — Ambulatory Visit: Payer: Medicare Other | Attending: Cardiovascular Disease | Admitting: Cardiovascular Disease

## 2022-02-25 ENCOUNTER — Telehealth: Payer: Self-pay | Admitting: Neurology

## 2022-02-25 VITALS — BP 150/81 | HR 62 | Ht 69.0 in | Wt 171.4 lb

## 2022-02-25 DIAGNOSIS — G20C Parkinsonism, unspecified: Secondary | ICD-10-CM

## 2022-02-25 DIAGNOSIS — Z95810 Presence of automatic (implantable) cardiac defibrillator: Secondary | ICD-10-CM

## 2022-02-25 DIAGNOSIS — Z79899 Other long term (current) drug therapy: Secondary | ICD-10-CM

## 2022-02-25 DIAGNOSIS — I4892 Unspecified atrial flutter: Secondary | ICD-10-CM | POA: Diagnosis not present

## 2022-02-25 DIAGNOSIS — Z5181 Encounter for therapeutic drug level monitoring: Secondary | ICD-10-CM | POA: Insufficient documentation

## 2022-02-25 DIAGNOSIS — I447 Left bundle-branch block, unspecified: Secondary | ICD-10-CM | POA: Diagnosis not present

## 2022-02-25 DIAGNOSIS — G40909 Epilepsy, unspecified, not intractable, without status epilepticus: Secondary | ICD-10-CM

## 2022-02-25 DIAGNOSIS — D6869 Other thrombophilia: Secondary | ICD-10-CM

## 2022-02-25 DIAGNOSIS — E7800 Pure hypercholesterolemia, unspecified: Secondary | ICD-10-CM

## 2022-02-25 DIAGNOSIS — I2510 Atherosclerotic heart disease of native coronary artery without angina pectoris: Secondary | ICD-10-CM

## 2022-02-25 DIAGNOSIS — E871 Hypo-osmolality and hyponatremia: Secondary | ICD-10-CM | POA: Diagnosis not present

## 2022-02-25 DIAGNOSIS — G20 Parkinson's disease: Secondary | ICD-10-CM | POA: Diagnosis not present

## 2022-02-25 DIAGNOSIS — G40211 Localization-related (focal) (partial) symptomatic epilepsy and epileptic syndromes with complex partial seizures, intractable, with status epilepticus: Secondary | ICD-10-CM

## 2022-02-25 DIAGNOSIS — I951 Orthostatic hypotension: Secondary | ICD-10-CM | POA: Insufficient documentation

## 2022-02-25 NOTE — Patient Instructions (Signed)
Medication Instructions:  STOP the Warfarin   *If you need a refill on your cardiac medications before your next appointment, please call your pharmacy*   Lab Work: None ordered If you have labs (blood work) drawn today and your tests are completely normal, you will receive your results only by: [place] (if you have MyChart) OR A paper copy in the mail If you have any lab test that is abnormal or we need to change your treatment, we will call you to review the results.   Testing/Procedures: Your physician has requested that you have an echocardiogram. Echocardiography is a painless test that uses sound waves to create images of your heart. It provides your doctor with information about the size and shape of your heart and how well your heart's chambers and valves are working. You may receive an ultrasound enhancing agent through an IV if needed to better visualize your heart during the echo.This procedure takes approximately one hour. There are no restrictions for this procedure. This will take place at the [address], [address].   Follow-Up: At [facility], you and your health needs are our priority.  As part of our continuing mission to provide you with exceptional heart care, we have created designated Provider Care Teams.  These Care Teams include your primary Cardiologist (physician) and Advanced Practice Providers (APPs -  Physician Assistants and Nurse Practitioners) who all work together to provide you with the care you need, when you need it.  We recommend signing up for the patient portal called "MyChart".  Sign up information is provided on this After Visit Summary.  MyChart is used to connect with patients for Virtual Visits (Telemedicine).  Patients are able to view lab/test results, encounter notes, upcoming appointments, etc.  Non-urgent messages can be sent to your provider as well.   To learn more about what you can do with MyChart, go to  [email or web address].    Your next appointment:   3 month(s) on a device day  The format for your next appointment:   In Person  Provider:   Sanda Klein, MD

## 2022-02-25 NOTE — Progress Notes (Signed)
History of severe orthostatic hypotension complicated by falls, while on anticoagulation.  Tolerate systolic blood pressure up to 160.

## 2022-02-25 NOTE — Telephone Encounter (Signed)
I called Cameron Potts back She reports on 09/23/2021 pt had a seizure.  Cameron Potts confirmed the pt is taking  Dilantin Chewable 75 mg in the morning  Dilantin XR 300 mg in the evening  Keppra 2250 mg once at 6 pm   I advised I would fwd this message to Dr. April Manson and see if any changes need to be made and would update once he is back in the office tomorrow. Cameron Potts reports today the pt has been doing well no complaints.

## 2022-02-25 NOTE — Progress Notes (Addendum)
Cardiology Office Note:    Date:  02/25/2022   ID:  Cameron Potts, DOB September 29, 1935, MRN [id number]  PCP:  [name], MD   [place] Providers Cardiologist:  Sanda Klein, MD     Referring MD: [name], MD   Chief Complaint  Patient presents with   Atrial Fibrillation   ICD check  Cameron Potts is a 86 y.o. male who is being seen today for the evaluation of atrial fibrillation and ICD management at the request of [name], MD.   History of Present Illness:    Cameron Potts is a 86 y.o. male with a hx of paroxysmal atrial flutter/fibrillation and an episode of cardiac arrest (per limited documents available this apparently occurred not long after a right hippocampal mass resection complicated by seizures and cardiac arrest in 2008), which led to ICD implantation.  Cameron Potts lived in [place] for many years after which he retired to [place] and received care at the [facility] in [place].  For the last four years he has been living in [place], his wife's native town.  He is a resident at [facility]  He has continued receiving some of his medical care in the [facility] in [place].  Despite numerous hospitalizations for epilepsy and hyponatremia since 2019, there have been no active cardiac issues and cardiology in [place] has not yet been involved in his care, until today.  Cameron Potts is alert and oriented and able to hold a very coherent conversation, but is not able to provide too many details about his cardiac condition.  He is accompanied today by his grand son, Earleen Reaper, who has very little to add regarding his grandfather's medical history.  He was recently hospitalized for symptomatic hyponatremia associated with confusion, lethargy and falls.  Sodium level was as low as 123.  He received intravenous fluids.  Head CT showed mild right occipital scalp hematoma, but not intracranial hemorrhage.  He underwent a  CT of the chest that showed mild cardiomegaly, but an echocardiogram was not performed.  He was previously cared for by his cardiologist at the [facility] in [place] (he cannot recall their name), but he has not had a defibrillator check since 2018.  He was enrolled in a warfarin anticoagulation clinic without interruption while in [place].  Most of the information about his cardiac history is obtained from records from a hospitalization in 2015 at the [facility].  The patient himself is not entirely clear about the sequence of his cardiac health events.  He received his defibrillator after cardiac arrest following intractable seizures at the time of his brain surgery in 2008.  In 2015 he presented to the emergency room with an ICD shock not associated with syncope.  He was in sinus rhythm on presentation.  Device interrogation demonstrated that he had atrial flutter with variable ventricular conduction.  VT therapy zone was increased to 180 bpm and the patient's beta-blocker was increased.  [Addendum by Massie Kluver MD, Deborra Medina. on 01-Jul-2013 08:01 EST Pacemaker interrogation revealed the patient had atrial flutter with a slow atrial rate and fast ventricular rate with conduction. The patient's blood work is stable. Cardiology request the patient increase his Toprol-XL to 75 mg daily. The pacemaker nurse is also adjusting the defibrillator so that it will sense the irregularity of the atrial rhythm and she is turning up the threshold for VT to 180 beats per minute.]   From the  records in care everywhere we can deduce that he underwent a generator change out in 2016.    The patient's subsequent outpatient cardiology follow-up is available through "care everywhere".  There is no mention of cardiac issues throughout the subsequent hospitalizations and records that I am able to review.   He is on chronic treatment with flecainide 50 mg twice daily but is not receiving a beta-blocker (presumably due  to orthostatic hypotension).  Interrogation of his defibrillator in the office today shows that the device has not been interrogated in person since 11/28/2016.  There is no record of remote device downloads available since then either.  His device is a Medtronic Evera XT device (model number DD BB 1 D1), implanted as a generator change out in 2016.  The ventricular lead is a Environmental health practitioner lead implanted in 2008 (model not clear).  The atrial lead is a Medtronic 5076-52 cm lead also implanted in 2008.  There have been no episodes of high ventricular rate (either nonsustained or sustained) or any episodes of atrial fibrillation since the last device in office check in June 2018.  Technical parameters are normal on both the atrial and the ventricular lead.  Generator longevity is estimated at approximately 5 years.  Cannot comment on data from his original generator which she had from 2008 until 2016, but the current device has never delivered therapy for ventricular tachycardia or ventricular fibrillation since its implantation in 2016.  He has 96.5% atrial paced-ventricular sensed rhythm.  Findings suggest that there is no problem with atrial undersensing.  He never requires ventricular pacing with the current device settings of AAIR-DDDR, but has a very long AV delay in excess of 400 ms.  Activity level is roughly an hour a day and heart rate histogram distribution is appropriate.  There is suggestion of fluid overload in the last couple of weeks that is likely associated with treatment for hyponatremia during his hospitalization.  ECG shows atrial paced, ventricular sensed rhythm with very long AV delay at 440 ms and left bundle branch block with a QRS of 134 ms.  QTc is normal (appropriate for LBBB) at 487 ms  He has a history of drug-resistant focal epilepsy for which he underwent a right anterior temporal lobectomy in 2008.  He underwent implantation of a vagal nerve stimulator in the right  subclavian area for this in 2016.  This led to some improvement, but he has had multiple breakthrough seizures since, including intractable seizures requiring numerous episodes of hospitalization.  He also carries a diagnosis of parkinsonism and associated orthostatic hypotension.  He is on chronic warfarin anticoagulation (suspect warfarin was chosen over direct oral anticoagulants due to treatment with a variety of antiepileptic medications that have drug interactions including phenytoin and Keppra and Vimpat).  Recently has had increasingly frequent falls and there is concern about the safety of chronic anticoagulation.  He has had recurrent problems with hyponatremia going back at least to 2012, also leading to a recent hospitalization at [facility] in August 2023 (admission sodium level 125, nadir 123, discharged 137).  He received intravenous normal saline during that hospitalization, with improvement.  Even before that hospitalization he was receiving salt tablets.  Also appear to have cellulitis of the left lower leg treated with antibiotics.  Incidental note was made of a thyroid nodule on chest CT.  Functional status is not too bad.  He denies shortness of breath with activity, orthopnea, PND or lower extremity edema.  He walks with a  walker.  He denies chest pain at rest or with activity and has no history of coronary disease.  He denies palpitations.  He has orthostatic dizziness.  He has not had problems with claudication.  He denies headaches.  He has not had recent seizures.  As far as I know he is never had a stroke or TIA.  Head CT shows sequelae of previous right temporal lobectomy as well as a "unchanged small chronic infarct involving the right cingulate gyrus" comparison to 2020.  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   brimonidine-timolol (COMBIGAN) 0.2-0.5 % ophthalmic solution Place 1 drop into both eyes every 12 (twelve) hours.    calcium carbonate (TUMS SMOOTHIES) 750 MG chewable tablet Chew 2 tablets by mouth 4 (four) times daily as needed for heartburn.   DILANTIN 100 MG ER capsule Take 100 mg by mouth 3 (three) times daily.   econazole nitrate 1 % cream Apply 1 Application topically 2 (two) times daily.   flecainide (TAMBOCOR) 50 MG tablet Take 50 mg by mouth 2 (two) times daily.   fluocinonide ointment (LIDEX) 2.59 % Apply 1 Application topically 2 (two) times daily as needed (rash).   lacosamide (VIMPAT) 200 MG TABS tablet Take 1 tablet (200 mg total) by mouth 2 (two) times daily.   latanoprost (XALATAN) 0.005 % ophthalmic solution Place 1 drop into both eyes at bedtime.   Levetiracetam 750 MG TB24 Take 2,250 mg by mouth daily.   LORazepam (ATIVAN) 2 MG/ML concentrated solution Take 2 mg by mouth as needed for anxiety or seizure. MAR instructions:If seizure activity, given '2mg'$  IM.If continued seizure activity after 83mn give Ativan '2mg'$  IM again.   miconazole (MICOTIN) 2 % powder Apply topically 2  (two) times daily.   phenytoin (DILANTIN) 50 MG tablet Chew 75 mg by mouth daily.   PHENYTOIN SODIUM EXTENDED PO Take 300 mg by mouth daily. In the evening.   polyethylene glycol (MIRALAX / GLYCOLAX) packet Take 17 g by mouth every other day.   tamsulosin (FLOMAX) 0.4 MG CAPS capsule Take 0.4 mg by mouth daily.     Allergies:   Depakote [divalproex sodium]   Social History   Socioeconomic History   Marital status: Married    Spouse name: JOpal Sidles  Number of children: 3   Years  of education: MBA   Highest education level: Not on file  Occupational History   Occupation: Retired   Occupation: Charity fundraiser  Tobacco Use   Smoking status: Never   Smokeless tobacco: Never   Tobacco comments:    quit 1960s  Vaping Use   Vaping Use: Never used  Substance and Sexual Activity   Alcohol use: Not Currently    Comment: former beer drinker   Drug use: No   Sexual activity: Not Currently  Other Topics Concern   Not on file  Social History Narrative   Lives Assisted Living, [facility]   Right Handed   Drinks 2-3 cups caffeine daily   Social Determinants of Health   Financial Resource Strain: Low Risk  (04/15/2018)   Overall Financial Resource Strain (CARDIA)    Difficulty of Paying Living Expenses: Not hard at all  Food Insecurity: No Food Insecurity (04/15/2018)   Hunger Vital Sign    Worried About Running Out of Food in the Last Year: Never true    [place] in the Last Year: Never true  Transportation Needs: No Transportation Needs (04/15/2018)   PRAPARE - Hydrologist (Medical): No    Lack of Transportation (Non-Medical): No  Physical Activity: Sufficiently Active (04/15/2018)   Exercise Vital Sign    Days of Exercise per Week: 5 days    Minutes of Exercise per Session: 30 min  Stress: No Stress Concern Present (04/15/2018)   [place]    Feeling of Stress : Not at all   Social Connections: Somewhat Isolated (04/15/2018)   Social Connection and Isolation Panel [NHANES]    Frequency of Communication with Friends and Family: Three times a week    Frequency of Social Gatherings with Friends and Family: Three times a week    Attends Religious Services: Never    Active Member of Clubs or Organizations: No    Attends Archivist Meetings: Never    Marital Status: Married     Family History: The patient's family history includes Cancer - Lung in his mother; Cancer - Prostate in his father.  ROS:   Please see the history of present illness.     All other systems reviewed and are negative.  EKGs/Labs/Other Studies Reviewed:    The following studies were reviewed today: Review of multiple imaging studies from recent hospitalizations in [place] Review of hospitalization records and notes from [facility], [place]  EKG:  EKG is ordered today.  The ekg ordered today demonstrates atrial paced, ventricular sensed rhythm with very long AV delay approximately 440 ms, left bundle branch block, QRS 134 ms, QTc 487 ms  Recent Labs: 01/19/2022: TSH 2.685 02/01/2022: Hemoglobin 12.1; Platelets 242 02/07/2022: BUN 14; Creatinine 0.8; Potassium 4.4; Sodium 136  Recent Lipid Panel    Component Value Date/Time   CHOL 214 (A) 05/15/2021 0000   TRIG 79 05/15/2021 0000   HDL 61 05/15/2021 0000   LDLCALC 141 05/15/2021 0000     Risk Assessment/Calculations:    CHA2DS2-VASc Score = 4   This indicates a 4.8% annual risk of stroke. The patient's score is based upon: CHF History: 0 HTN History: 0 Diabetes History: 0 Stroke History: 2 Vascular Disease History: 0 Age Score: 2 Gender Score: 0     HYPERTENSION CONTROL Vitals:   02/25/22 0910 02/25/22 0930  BP: (!) 158/84 (!) 150/81    The patient's blood pressure is elevated  above target today.  In order to address the patient's elevated BP: Blood pressure will be monitored at home to  determine if medication changes need to be made.       Physical Exam:    VS:  BP (!) 150/81   Pulse 62   Ht '5\' 9"'$  (1.753 m)   Wt 171 lb 6.4 oz (77.7 kg)   SpO2 95%   BMI 25.31 kg/m     Wt Readings from Last 3 Encounters:  02/25/22 171 lb 6.4 oz (77.7 kg)  02/11/22 171 lb (77.6 kg)  02/04/22 171 lb 12.8 oz (77.9 kg)     GEN:  Well nourished, well developed in no acute distress HEENT: Normal NECK: No JVD; No carotid bruits LYMPHATICS: No lymphadenopathy CARDIAC: RRR, no murmurs, rubs, gallops RESPIRATORY:  Clear to auscultation without rales, wheezing or rhonchi .  Healthy appearing device sites in both the right subclavian and left subclavian areas ABDOMEN: Soft, non-tender, non-distended MUSCULOSKELETAL:  No edema; No deformity  SKIN: Warm and dry NEUROLOGIC:  Alert and oriented x 3 PSYCHIATRIC:  Normal affect   ASSESSMENT:    1. Atrial flutter, unspecified type ([place])   2. Acquired thrombophilia ([place])   3. LBBB (left bundle branch block)   4. Encounter for monitoring flecainide therapy   5. Coronary artery calcification seen on CAT scan   6. Hypercholesterolemia   7. ICD (implantable cardioverter-defibrillator) in place   8. Acute hyponatremia   9. Seizure disorder ([place])   10. Parkinsonian syndrome associated with symptomatic orthostatic hypotension (HCC)    PLAN:    In order of problems listed above:  History of atrial flutter: The only documented atrial arrhythmia that I can identify at this time is a spell of slow atrial flutter with high ventricular rate due to 1: 1 AV conduction that occurred in 2015 and led to defibrillator discharge.  It is concerning that he is on flecainide therapy without any AV node blocking agents, but these have been withheld due to his history of orthostatic hypotension.  Could consider using digoxin.  Would like to obtain a few more data before making this decision.  Having said that, he has not had any atrial arrhythmia documented on  his dual-chamber ICD at least since 2018, for more than 5 years.   Acquired thrombophilia: Even though he meets clinical criteria for full anticoagulation, at this point the risk of falls and serious intracranial hemorrhage outweighs the benefit of anticoagulant.  In addition, due to his seizure medications we are unable to use the safer novel anticoagulants and therefore have to use warfarin, with a higher intracranial bleeding risk.  I have advised Mr. Heckendorn to stop treatment with warfarin.  We will continue to monitor for recurrence of atrial fibrillation via his device and can revisit this decision in the future. LBBB: Unknown chronicity.  Also has evidence of very long AV conduction times.  During testing of his lead capture thresholds today, there was evidence of increased sinus node recovery time in the absence of a robust junctional/ventricular escape rhythm.  Thankfully lead parameters are excellent and device function is normal.  We will check an echocardiogram to see if there is any evidence of underlying structural heart disease.  Need to reevaluate LVEF while he is on treatment with flecainide.   Flecainide: I am concerned about the use of this medication.  He has a wide QRS complex and I am not sure how long that is been the case.  Flecainide can  increase to the pacing thresholds.  Flecainide may have been responsible for atrial flutter with 1: 1 AV conduction.  Unable to use beta-blockers in conjunction with flecainide, due to orthostatic hypotension.  No known clinical history of CAD or PAD, but these are definitely possible at his age.  On the other hand, flecainide may be the reason why he has had such a low burden of atrial arrhythmia.  No changes were made to his medications at this time.  Once we have a little more information, it may be worthwhile getting EP consultation.  The presence or absence of structural heart disease will be important, wait for the echocardiogram.  Coronary artery  calcifications: " Severe atheromatous calcifications in the coronary arteries" described on CT.  No history of angina and no clinical events of coronary insufficiency.  Does not have intermittent claudication.  Not currently on lipid-lowering therapy.  If there is evidence of depressed LVEF or regional wall motion abnormalities on echocardiography, plan to stop flecainide and perform an evaluation for coronary artery disease. HLP: Most recent LDL cholesterol from a year ago was 141.  Again he has not had clinical events associated with vascular disease, but has some imaging evidence of atherosclerosis. ICD: Normal device function.  With current device settings he has not had any inappropriate discharges (did have ICD shock for atrial flutter with high ventricular rate in 2015, on his old generator).  No device reprogramming performed today.  He does not currently require ventricular pacing but has a very long AV delay and a left bundle branch block.  It is possible that he will begin using his ventricular lead in the future. Hyponatremia: May be related to SIADH or chronic treatment with levetiracetam, but his seizures have been very difficult to manage, so I do not think this medication can be stopped.  Does not appear to have adrenal insufficiency (baseline cortisol level 20.5, albeit with a paradoxical drop in response to ACTH on labs performed 01/26/2022).  He is tolerating salt loading without clinical heart failure.  The "heart failure" trigger on his Optivol readings correlates with intravenous fluid administration during his recent hospitalization and is already showing signs of returning to baseline.   Seizure disorder:  e is on 3 different medications and has a vagal nerve stimulator.  He has had breakthrough seizures despite this.  Has history of right temporal lobectomy although I am uncertain whether this was performed to control his seizures or was the cause for his seizures. Orthostatic  hypotension: Has had numerous falls, with serious potential consequences while on anticoagulation.  Seems to be related to his parkinsonism syndrome, not sure if there is an association with salt wasting/hyponatremia.  Avoid "perfect" control of his blood pressure.  Would tolerate systolic blood pressure of 160.  Avoid sudden changes in position.  Consider compression stockings and/or abdominal binder if the situation worsens.           Medication Adjustments/Labs and Tests Ordered: Current medicines are reviewed at length with the patient today.  Concerns regarding medicines are outlined above.  Orders Placed This Encounter  Procedures   EKG 12-Lead   ECHOCARDIOGRAM COMPLETE   No orders of the defined types were placed in this encounter.   Patient Instructions  Medication Instructions:  STOP the Warfarin   *If you need a refill on your cardiac medications before your next appointment, please call your pharmacy*   Lab Work: None ordered If you have labs (blood work) drawn today and your tests  are completely normal, you will receive your results only by: MyChart Message (if you have MyChart) OR A paper copy in the mail If you have any lab test that is abnormal or we need to change your treatment, we will call you to review the results.   Testing/Procedures: Your physician has requested that you have an echocardiogram. Echocardiography is a painless test that uses sound waves to create images of your heart. It provides your doctor with information about the size and shape of your heart and how well your heart's chambers and valves are working. You may receive an ultrasound enhancing agent through an IV if needed to better visualize your heart during the echo.This procedure takes approximately one hour. There are no restrictions for this procedure. This will take place at the [address], [address].   Follow-Up: At [facility], you and your health needs are our  priority.  As part of our continuing mission to provide you with exceptional heart care, we have created designated Provider Care Teams.  These Care Teams include your primary Cardiologist (physician) and Advanced Practice Providers (APPs -  Physician Assistants and Nurse Practitioners) who all work together to provide you with the care you need, when you need it.  We recommend signing up for the patient portal called "MyChart".  Sign up information is provided on this After Visit Summary.  MyChart is used to connect with patients for Virtual Visits (Telemedicine).  Patients are able to view lab/test results, encounter notes, upcoming appointments, etc.  Non-urgent messages can be sent to your provider as well.   To learn more about what you can do with MyChart, go to [email or web address].    Your next appointment:   3 month(s) on a device day  The format for your next appointment:   In Person  Provider:   Sanda Klein, MD         Signed, Sanda Klein, MD  02/25/2022 3:17 PM    [place]

## 2022-02-25 NOTE — Telephone Encounter (Signed)
Cameron Potts is calling from [facility]. Stated Pt had a seizure on 9/9 with a fall. Stated had swipe device twice and gave Ativan. Stated she needs to know if Pt medication need to be adjusted.

## 2022-02-26 ENCOUNTER — Other Ambulatory Visit: Payer: Self-pay | Admitting: Neurology

## 2022-02-26 ENCOUNTER — Telehealth: Payer: Self-pay

## 2022-02-26 MED ORDER — CLOBAZAM 10 MG PO TABS
10.0000 mg | ORAL_TABLET | Freq: Every evening | ORAL | 5 refills | Status: DC
Start: 2022-02-26 — End: 2022-05-13

## 2022-02-26 NOTE — Telephone Encounter (Signed)
-----   Message from Simone Curia, RN sent at 02/25/2022 10:31 AM EDT ----- Please see below from Dr. Loletha Grayer office.   ----- Message ----- From: Ricci Barker, RN Sent: 02/25/2022  10:28 AM EDT To: [facility]  This patient will need to be set up with the device clinic. He is a new patient with Dr. Sallyanne Kuster.  We are trying to get him a transmitter first though.  Defib Evera Dr Arlana Pouch Df1 ([id number]) Mayer Masker [id number] Implanted: Qty: 1 on 09/07/2014  Implant Cardiac Defibrillator Chest Medtronic    [id number]  Description: Device Manufacturer - [employer] Device Status Text [phone].

## 2022-02-26 NOTE — Telephone Encounter (Signed)
I called Tabatha back and left vm for call back to discuss.

## 2022-02-26 NOTE — Telephone Encounter (Signed)
I called Tabatha back and we discussed message she verbalized understanding and appreciation for the call. She will update the pt on the change in medication therapy and understands the dosage confirmed with read back.

## 2022-02-26 NOTE — Telephone Encounter (Signed)
I put the patient in [facility] and ordered him a monitor. He should receive it in 7-10 business days.

## 2022-02-26 NOTE — Telephone Encounter (Signed)
Please contact [facility] and advised them to add a fourth agent: Clobazam 10 mg at night, initially start with 1/2 tablet for one week then increase to full tablet. Please inform [facility] that somnolence is the main side effect of this medication, that is why he should take it at night. Please remind them to contact us for any additional concerns after starting the medication.   Dr. April Manson

## 2022-02-27 NOTE — Telephone Encounter (Signed)
Noted. There are no interactions between Coumadin and Clobazam.

## 2022-02-27 NOTE — Telephone Encounter (Signed)
I called and spoke with Cameron Potts about this. She now sts the pt is agreeable to starting the Clobazam tonight but would like to have Dilantin drawn after being off the Coumadin for 10 days ( re draw would be 9/21 or 9/22) if provider is agreeable.   I will send to Dr. April Manson for review. If he is ok with this, order will be sent to # [id number].

## 2022-02-27 NOTE — Telephone Encounter (Signed)
Received a telephone from [place] today. She sts Mr. Magnan is worried over starting the Clobazam. She sts he d/c Coumadin on 02/24/2022 and would like to wait 10 days before adding a new medication if Dr. April Manson is agreeable.   I will send message to MD .  To note [phone] is the call back for [facility].

## 2022-02-27 NOTE — Telephone Encounter (Signed)
That is ok.    Thanks.

## 2022-02-28 NOTE — Telephone Encounter (Signed)
Dr. April Manson agreed to the lab order. I have placed and faxed to the facility attention Marsha/[facility].

## 2022-02-28 NOTE — Addendum Note (Signed)
Addended by: Verlin Grills on: 02/28/2022 07:51 AM   Modules accepted: Orders

## 2022-03-04 ENCOUNTER — Telehealth: Payer: Self-pay | Admitting: Neurology

## 2022-03-04 NOTE — Telephone Encounter (Signed)
[facility] (Crystal) Requesting order for Dilantin levels.  Fax [phone]

## 2022-03-04 NOTE — Telephone Encounter (Addendum)
Per pt's request Dilantin to be checked 10 days post d/c of coumadin, no change in the parameters at this time. Updated Crystal at [facility] notified of this and verbalized understanding/appreciation for the call.

## 2022-03-04 NOTE — Telephone Encounter (Signed)
I have faxed the order to [facility], confirmation received.

## 2022-03-04 NOTE — Telephone Encounter (Signed)
Crystal @ [facility] has called back for 1. When does Dr April Manson want the dilantin levels drawn and 2. What are the dilantin perimeters, Crystal can be called at [phone]

## 2022-03-07 DIAGNOSIS — R7989 Other specified abnormal findings of blood chemistry: Secondary | ICD-10-CM | POA: Diagnosis not present

## 2022-03-07 LAB — BASIC METABOLIC PANEL
BUN: 15 (ref 4–21)
CO2: 25 — AB (ref 13–22)
Chloride: 103 (ref 99–108)
Creatinine: 0.7 (ref 0.6–1.3)
Glucose: 97
Potassium: 4.4 mEq/L (ref 3.5–5.1)
Sodium: 138 (ref 137–147)

## 2022-03-07 LAB — COMPREHENSIVE METABOLIC PANEL
Calcium: 8.9 (ref 8.7–10.7)
eGFR: >90

## 2022-03-11 ENCOUNTER — Encounter: Payer: Self-pay | Admitting: Internal Medicine

## 2022-03-11 NOTE — Telephone Encounter (Signed)
I called Tabitha at [facility]. Patient was started on clobazam '5mg'$  QHS and his family noticed some confusion. I confirmed that patient saw his PCP since there was an acute onset of confusion to rule out other processes and Tabitha confirmed that he saw his PCP while taking clobazam '5mg'$ . When the clobazam was increased to '10mg'$ , the family noted that he was significantly more confused. His wife has stopped giving him the clobazam for the past three days and does not wish for him to resume it. Family reports that his confusion is improving.  Lawerance Bach will hold the clobazam until further instructions from Dr. April Manson when he returns. I will send as well to the work in MD as an FYI in case anything else is recommended.

## 2022-03-11 NOTE — Telephone Encounter (Signed)
Please advise if ok to continue 5 mg daily of the Clobazam until Dr. April Manson returns to see if s/x improve.   Thanks!

## 2022-03-11 NOTE — Telephone Encounter (Signed)
He can go back down to the 5 mg daily for now, thanks

## 2022-03-11 NOTE — Telephone Encounter (Signed)
[facility] ([facility]) on 03/06/22 from 5 mg to 10 mg. Pt can not walk, pt said feel like he is drugged, some confusion. Pt is requesting to decrease back 5 mg or discontinue medication. Would like a call from the nurse.  Contact info:[phone]

## 2022-03-12 ENCOUNTER — Other Ambulatory Visit (HOSPITAL_BASED_OUTPATIENT_CLINIC_OR_DEPARTMENT_OTHER): Payer: PRIVATE HEALTH INSURANCE

## 2022-03-18 ENCOUNTER — Non-Acute Institutional Stay: Payer: Medicare Other | Admitting: Adult Health

## 2022-03-18 ENCOUNTER — Ambulatory Visit: Payer: Medicare Other | Admitting: Neurology

## 2022-03-18 ENCOUNTER — Encounter: Payer: Self-pay | Admitting: Adult Health

## 2022-03-18 VITALS — BP 144/88 | HR 79 | Temp 97.5°F | Ht 69.0 in | Wt 174.0 lb

## 2022-03-18 DIAGNOSIS — Z95810 Presence of automatic (implantable) cardiac defibrillator: Secondary | ICD-10-CM | POA: Diagnosis not present

## 2022-03-18 DIAGNOSIS — R269 Unspecified abnormalities of gait and mobility: Secondary | ICD-10-CM

## 2022-03-18 DIAGNOSIS — L111 Transient acantholytic dermatosis [Grover]: Secondary | ICD-10-CM

## 2022-03-18 DIAGNOSIS — G40919 Epilepsy, unspecified, intractable, without status epilepticus: Secondary | ICD-10-CM | POA: Diagnosis not present

## 2022-03-18 DIAGNOSIS — E871 Hypo-osmolality and hyponatremia: Secondary | ICD-10-CM

## 2022-03-18 DIAGNOSIS — E782 Mixed hyperlipidemia: Secondary | ICD-10-CM | POA: Diagnosis not present

## 2022-03-18 DIAGNOSIS — E559 Vitamin D deficiency, unspecified: Secondary | ICD-10-CM

## 2022-03-18 DIAGNOSIS — I482 Chronic atrial fibrillation, unspecified: Secondary | ICD-10-CM | POA: Diagnosis not present

## 2022-03-18 DIAGNOSIS — R350 Frequency of micturition: Secondary | ICD-10-CM | POA: Diagnosis not present

## 2022-03-18 DIAGNOSIS — N401 Enlarged prostate with lower urinary tract symptoms: Secondary | ICD-10-CM

## 2022-03-18 DIAGNOSIS — I2510 Atherosclerotic heart disease of native coronary artery without angina pectoris: Secondary | ICD-10-CM | POA: Diagnosis not present

## 2022-03-18 DIAGNOSIS — F4322 Adjustment disorder with anxiety: Secondary | ICD-10-CM

## 2022-03-18 NOTE — Progress Notes (Addendum)
Location: [facility]  POS: clinic  Provider:  Cindi Carbon, [place] [phone]   Code Status: DNR Goals of Care:     01/28/2022   10:10 AM  Advanced Directives  Does Patient Have a Medical Advance Directive? Yes  Type of Advance Directive Out of facility DNR (pink MOST or yellow form)  Does patient want to make changes to medical advance directive? No - Patient declined  Pre-existing out of facility DNR order (yellow form or pink MOST form) Pink MOST form placed in chart (order not valid for inpatient use);Yellow form placed in chart (order not valid for inpatient use)     Chief Complaint  Patient presents with   Medical Management of Chronic Issues    2 Month follow up   Quality Metric Gaps    To discuss need for zoster,covid and flu or postpone if patient refuses. NCIR Verified.     HPI: Patient is a 86 y.o. male seen today for medical management of chronic diseases.   PMH significant for afib, HLD, seizures, PD, orthostatic hypotension, frequent falls, grovers disease, chronic hyponatremia, ICD implant, vagal nerve stimulator implant.  He was in the hospital in August for acute hyponatremia and cellulitis. SIADH was determined to be the most likely cause in the setting of an acute illness. Current NA 138 9/21 on salt tabs. FR 1500 ordered but he says he is not compliant  SBP ranging 130-170s.   Hx of orthostatic hypotension, no current low numbers Frequently falls, uses a walker.  Chronic grover's disease and fungal rash to feet. Applies clotrimazole to his feet   He was seen by Dr Sallyanne Kuster for evaluation of continued management of coumadin for CVA risk reduction due to hx of aflutter. They recommend coumadin be stopped due falls and risk of intracranial hemorrhage. He also had his ICD interrogated which had not been done in quite sometime. He is atrial paced with no ventricular pacing, long AV delay. Echo due later this week.    Has been having seizures and saw Dr. April Manson with neurology, hx of recurrent epilepsy and vagal nerve stimulator. During his recent visit his VNS setting was changed. He continues on vimpat, keppra, and phenytoin. Phenytoin 22.9 03/07/22 He had a seizure on 9/9 received Ativan and VNS swiped. He was also placed on clobazem for seizures but this causes lethargy and confusion and so it was placed on hold until his neurologist can review.  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[place])    Urinary urgency     Past Surgical History:  Procedure Laterality Date   CARDIAC DEFIBRILLATOR PLACEMENT     cataract surgery     COLONOSCOPY     CRANIOTOMY Right    right anterior temporal resection   defibrillator replaced  March 2016   IMPLANTATION VAGAL NERVE STIMULATOR  June 2016   NASAL SINUS SURGERY     TONSILLECTOMY     age 18  VASECTOMY  1985    Allergies  Allergen Reactions   Depakote [Divalproex Sodium] Other (See Comments)    Arthralgias     Outpatient Encounter Medications as of 03/18/2022  Medication Sig   acetaminophen (TYLENOL) 325 MG tablet Take 650 mg by mouth every 6 (six) hours as needed for mild pain or moderate pain.   ALPRAZolam (XANAX) 0.25 MG tablet Take 1 tablet (0.25 mg total) by mouth 2 (two) times daily.   brimonidine-timolol (COMBIGAN) 0.2-0.5 % ophthalmic solution Place 1 drop into both eyes every 12 (twelve) hours.    calcium carbonate (TUMS SMOOTHIES) 750 MG chewable tablet Chew 2 tablets by mouth 4 (four) times daily as needed for  heartburn.   cholecalciferol (VITAMIN D) 400 units TABS tablet Take 400 Units by mouth 2 (two) times daily.   clobetasol cream (TEMOVATE) 8.34 % Apply 1 application  topically 2 (two) times daily as needed (rash).   DILANTIN 100 MG ER capsule Take 300 mg by mouth. In the evening   econazole nitrate 1 % cream Apply 1 Application topically 2 (two) times daily.   finasteride (PROSCAR) 5 MG tablet Take 5 mg by mouth every morning.   flecainide (TAMBOCOR) 50 MG tablet Take 50 mg by mouth 2 (two) times daily.   fluocinonide ointment (LIDEX) 1.96 % Apply 1 Application topically 2 (two) times daily as needed (rash).   lacosamide (VIMPAT) 200 MG TABS tablet Take 1 tablet (200 mg total) by mouth 2 (two) times daily.   latanoprost (XALATAN) 0.005 % ophthalmic solution Place 1 drop into both eyes at bedtime.   Levetiracetam 750 MG TB24 Take 2,250 mg by mouth daily.   LORazepam (ATIVAN) 2 MG/ML concentrated solution as needed for anxiety or seizure. MAR instructions:If seizure activity, given '2mg'$  IM.If continued seizure activity after 58mn give Ativan '2mg'$  IM again.   miconazole (MICOTIN) 2 % powder Apply topically 2 (two) times daily.   phenytoin (DILANTIN) 50 MG tablet Chew 75 mg by mouth daily.   polyethylene glycol (MIRALAX / GLYCOLAX) packet Take 17 g by mouth every other day.   sodium chloride 1 g tablet Take 1 tablet (1 g total) by mouth 2 (two) times daily with a meal.   tamsulosin (FLOMAX) 0.4 MG CAPS capsule Take 0.4 mg by mouth daily.   triamcinolone cream (KENALOG) 0.1 % Apply 1 Application topically 2 (two) times daily as needed (flares).   cloBAZam (ONFI) 10 MG tablet Take 1 tablet (10 mg total) by mouth at bedtime. (Patient not taking: Reported on 03/18/2022)   [DISCONTINUED] PHENYTOIN SODIUM EXTENDED PO Take 300 mg by mouth daily. In the evening.   No facility-administered encounter medications on file as of 03/18/2022.    Review of Systems:  Review of Systems  Constitutional:  Negative  for activity change, appetite change, chills, diaphoresis, fatigue, fever and unexpected weight change.  HENT:  Negative for congestion.   Respiratory:  Negative for cough, shortness of breath, wheezing and stridor.   Cardiovascular:  Positive for leg swelling. Negative for chest pain and palpitations.  Gastrointestinal:  Negative for abdominal distention, abdominal pain, constipation and diarrhea.  Genitourinary:  Negative for difficulty urinating and dysuria.  Musculoskeletal:  Positive for gait problem. Negative for arthralgias, back pain, joint swelling and myalgias.  Skin:  Positive for rash.  Neurological:  Positive for seizures and syncope (hx). Negative for dizziness, facial asymmetry, speech difficulty, weakness and headaches.  Hematological:  Negative for adenopathy. Does not bruise/bleed easily.  Psychiatric/Behavioral:  Positive for behavioral  problems and confusion. Negative for agitation.     Health Maintenance  Topic Date Due   Zoster Vaccines- Shingrix (2 of 2) 01/29/2017   COVID-19 Vaccine (4 - Moderna risk series) 05/23/2021   INFLUENZA VACCINE  01/15/2022   TETANUS/TDAP  09/18/2030   Pneumonia Vaccine 74+ Years old  Completed   HPV VACCINES  Aged Out    Physical Exam: Vitals:   03/18/22 1346  BP: (!) 144/88  Pulse: 79  Temp: (!) 97.5 F (36.4 C)  TempSrc: Skin  SpO2: 98%  Weight: 174 lb (78.9 kg)  Height: '5\' 9"'$  (1.753 m)   Body mass index is 25.7 kg/m. Physical Exam Vitals and nursing note reviewed.  Constitutional:      General: He is not in acute distress.    Appearance: He is not diaphoretic.  HENT:     Head: Normocephalic and atraumatic.     Right Ear: Tympanic membrane and ear canal normal.     Left Ear: Tympanic membrane and ear canal normal.     Mouth/Throat:     Mouth: Mucous membranes are moist.     Pharynx: Oropharynx is clear.  Eyes:     General:        Right eye: No discharge.        Left eye: No discharge.     Conjunctiva/sclera:  Conjunctivae normal.     Pupils: Pupils are equal, round, and reactive to light.  Neck:     Thyroid: No thyromegaly.     Vascular: No JVD.     Trachea: No tracheal deviation.  Cardiovascular:     Rate and Rhythm: Normal rate and regular rhythm.     Heart sounds: No murmur heard. Pulmonary:     Effort: Pulmonary effort is normal. No respiratory distress.     Breath sounds: Normal breath sounds. No wheezing.  Abdominal:     General: Bowel sounds are normal. There is no distension.     Palpations: Abdomen is soft.     Tenderness: There is no abdominal tenderness.  Musculoskeletal:     Cervical back: No rigidity or tenderness.     Comments: Trace BLE edema R>L  Lymphadenopathy:     Cervical: No cervical adenopathy.  Skin:    General: Skin is warm and dry.     Findings: Rash (maculopapular rash to Both arms and legs. erythema to both feet L>R) present.  Neurological:     Mental Status: He is alert and oriented to person, place, and time.     Cranial Nerves: No cranial nerve deficit.  Psychiatric:     Comments: Easily angered     Labs reviewed: Basic Metabolic Panel: Recent Labs    01/19/22 1658 01/19/22 2206 01/25/22 0647 01/26/22 0547 01/27/22 0333 02/01/22 0000 02/07/22 0000 03/07/22 0000  NA 123*   < > 123* 129* 131* 136* 136* 138  K 3.8   < > 4.5 4.5 4.7 4.5 4.4 4.4  CL 91*   < > 95* 99 104 99 100 103  CO2 21*   < > 21* 23 17* 23* 22 25*  GLUCOSE 131*   < > 95 100* 108*  --   --   --   BUN 7*   < > '9 10 17 13 14 15  '$ CREATININE 0.62   < > 0.60* 0.64 0.76 0.7 0.8 0.7  CALCIUM 8.5*   < > 8.5* 8.7* 7.6* 9.0 8.8 8.9  TSH 2.685  --   --   --   --   --   --   --    < > =  values in this interval not displayed.   Liver Function Tests: No results for input(s): "AST", "ALT", "ALKPHOS", "BILITOT", "PROT", "ALBUMIN" in the last 8760 hours. No results for input(s): "LIPASE", "AMYLASE" in the last 8760 hours. No results for input(s): "AMMONIA" in the last 8760  hours. CBC: Recent Labs    01/19/22 1139 01/19/22 1143 01/20/22 0250 01/21/22 0844 02/01/22 0000  WBC 5.3  --  4.1 4.3 7.4  NEUTROABS 2.4  --   --   --   --   HGB 11.6*   < > 11.7* 11.6* 12.1*  HCT 32.2*   < > 31.7* 31.5* 35*  MCV 90.4  --  91.9 88.5  --   PLT 234  --  213 236 242   < > = values in this interval not displayed.   Lipid Panel: Recent Labs    05/15/21 0000  CHOL 214*  HDL 61  LDLCALC 141  TRIG 79   No results found for: "HGBA1C"  Procedures since last visit: No results found.  Assessment/Plan  1. Refractory epilepsy ([place]) Followed by neurology on complex regimen including phenytoin, keppra, and xanax. Has prn ativan for acute event  2. Grover's disease Long term issue on prn steroid creams  3. Vitamin D deficiency On Vit D bid  4. ICD (implantable cardioverter-defibrillator) in place Now followed by cardiology with remote transmission   5. Hyponatremia Improved Continue sodium tabs Recommended compression hose due to swelling likely from salt tabs but he refused.   6. Mixed hyperlipidemia Lab Results  Component Value Date   LDLCALC 141 05/15/2021     7. Benign prostatic hyperplasia with urinary frequency Current on flomax and proscar No current issues.   8. Abnormality of gait Needs walker with him at all times due to fall risk   9. Adjustment disorder with anxious mood Has periods of being easily angered and difficult to work with per staff Would likely benefit from meds but due to his complex regimen and recent issues with s/e will hold off   10. Chronic atrial fibrillation Off coumadin due to risk Followed by cardiology TSH Ok will check labs prior to next visit.   Labs/tests ordered:  * No order type specified *CBC BMP DIlantin level, lipid prior to next apt.  Next appt:  3 months with Dr Lyndel Safe  He will get his covid and flu vaccine soon  Total time 40 min   time greater than 50% of total time spent doing pt counseling  and coordination of care

## 2022-03-18 NOTE — Telephone Encounter (Signed)
Pt rescheduled with Dr. April Manson for office visit on 04/17/2022 at 11:45am.

## 2022-03-18 NOTE — Telephone Encounter (Signed)
Can we move his appointment sooner than 12/7 to discuss medications changes. We can do in person or virtual visit.   Thanks

## 2022-03-19 ENCOUNTER — Telehealth: Payer: Self-pay | Admitting: Neurology

## 2022-03-19 NOTE — Telephone Encounter (Signed)
Pt had confusions, falls, unsteady on his feet. Discontinued medication on 03/11/22. Requesting order to discontinue the cloBAZam (ONFI) 10 MG tablet. Would like a call from the nurse.

## 2022-03-19 NOTE — Telephone Encounter (Signed)
I have faxed the cancellation order for this med to [facility], confirmation received.

## 2022-03-19 NOTE — Telephone Encounter (Signed)
I have typed the d/c order for [facility] and will have Dr. April Manson sign.

## 2022-03-19 NOTE — Telephone Encounter (Signed)
OK to discontinue Clobazam

## 2022-03-19 NOTE — Telephone Encounter (Signed)
Pt has been moved up to next available f/u (04/17/2022). [facility] requesting d/c order for Clobazam, pt refused med on 03/11/2022 and has been off since.   Ok to send d/c order?

## 2022-03-20 ENCOUNTER — Other Ambulatory Visit: Payer: Self-pay | Admitting: Orthopedic Surgery

## 2022-03-20 DIAGNOSIS — G40919 Epilepsy, unspecified, intractable, without status epilepticus: Secondary | ICD-10-CM

## 2022-03-20 MED ORDER — LACOSAMIDE 200 MG PO TABS
200.0000 mg | ORAL_TABLET | Freq: Two times a day (BID) | ORAL | 6 refills | Status: DC
Start: 2022-03-20 — End: 2022-05-16

## 2022-03-21 ENCOUNTER — Ambulatory Visit (INDEPENDENT_AMBULATORY_CARE_PROVIDER_SITE_OTHER): Payer: Medicare Other

## 2022-03-21 DIAGNOSIS — I4892 Unspecified atrial flutter: Secondary | ICD-10-CM | POA: Diagnosis not present

## 2022-03-21 LAB — ECHOCARDIOGRAM COMPLETE: Area-P 1/2: 4.39 cm2

## 2022-03-22 ENCOUNTER — Encounter: Payer: Self-pay | Admitting: Neurology

## 2022-03-22 DIAGNOSIS — Z23 Encounter for immunization: Secondary | ICD-10-CM | POA: Diagnosis not present

## 2022-03-22 NOTE — Progress Notes (Signed)
Recent Dilantin done on September 21 showed result of 22.9.  Okay to continue with Dilantin 300 mg at bedtime.

## 2022-03-27 ENCOUNTER — Encounter: Payer: Medicare Other | Admitting: Internal Medicine

## 2022-03-28 ENCOUNTER — Telehealth: Payer: Self-pay | Admitting: Cardiovascular Disease

## 2022-03-28 NOTE — Telephone Encounter (Signed)
Patient's wife called to get results of patient's echo

## 2022-03-28 NOTE — Telephone Encounter (Signed)
The patient's wife has been made aware of results.

## 2022-04-17 ENCOUNTER — Ambulatory Visit: Payer: Medicare Other | Admitting: Neurology

## 2022-04-22 DIAGNOSIS — Z23 Encounter for immunization: Secondary | ICD-10-CM | POA: Diagnosis not present

## 2022-04-23 ENCOUNTER — Ambulatory Visit: Payer: Medicare Other | Admitting: Neurology

## 2022-04-30 ENCOUNTER — Other Ambulatory Visit: Payer: Self-pay | Admitting: Orthopedic Surgery

## 2022-04-30 DIAGNOSIS — R451 Restlessness and agitation: Secondary | ICD-10-CM

## 2022-04-30 MED ORDER — ALPRAZOLAM 0.25 MG PO TABS: 0.2500 mg | ORAL_TABLET | Freq: Two times a day (BID) | ORAL | 5 refills | Status: DC

## 2022-05-02 ENCOUNTER — Telehealth: Payer: Self-pay | Admitting: Neurology

## 2022-05-02 NOTE — Telephone Encounter (Signed)
Pt wife is calling to reschedule appointment with Dr. April Manson from 11/6. Gave her sooner appointment of 3/19 @ 2:45 pm. She said he needs to be seen sooner and requested a nurse give her a call.

## 2022-05-02 NOTE — Telephone Encounter (Signed)
11/27 at 1200

## 2022-05-02 NOTE — Telephone Encounter (Signed)
I called pt wife. She said she will be out of town on the days I offered her. Stated she will be free on 11/27 and then the week on December the 4th. She is requesting appointment on these days.

## 2022-05-02 NOTE — Telephone Encounter (Signed)
OK for an add on at 4 PM or 12 PM

## 2022-05-13 ENCOUNTER — Encounter: Payer: Self-pay | Admitting: Neurology

## 2022-05-13 ENCOUNTER — Ambulatory Visit (INDEPENDENT_AMBULATORY_CARE_PROVIDER_SITE_OTHER): Payer: Medicare Other | Admitting: Neurology

## 2022-05-13 VITALS — BP 164/74 | HR 75 | Ht 69.0 in | Wt 173.5 lb

## 2022-05-13 DIAGNOSIS — G40211 Localization-related (focal) (partial) symptomatic epilepsy and epileptic syndromes with complex partial seizures, intractable, with status epilepticus: Secondary | ICD-10-CM | POA: Diagnosis not present

## 2022-05-13 DIAGNOSIS — R269 Unspecified abnormalities of gait and mobility: Secondary | ICD-10-CM | POA: Diagnosis not present

## 2022-05-13 NOTE — Patient Instructions (Signed)
Continue with Vimpat 200 mg BID  Keppra XR 2250 mg by mouth daily   Dilantin 75 mg morning and 300 mg VNS interrogated and changes made. If breakthrough seizure, will increase either Dilantin to 150/300 mg or Keppra to 3000 mg daily.  Follow up as scheduled in March.

## 2022-05-13 NOTE — Progress Notes (Signed)
Reason for visit: Intractable seizures, gait disorder  Cameron Potts is an 86 y.o. male  Interval History 05/13/2022: Patient presents today for follow-up, he is accompanied by wife.  Since last visit in August, he had 1 breakthrough seizure in September.  At that time, we had tried him on clobazam but he could not tolerate due to side effects of somnolence and confusion.  Therefore clobazam has been discontinued.  He is currently on Vimpat, Dilantin, and Keppra.  He could not tolerate higher dose of VNS change due to cough and hoarseness.  He has not had any additional seizures.  He reports that he is back to his baseline.   Interval History 02/11/2022:  Cameron Potts presents today for follow-up, he is accompanied by his wife. Since last visit in March, he has been doing well, denies any seizures, denies any side effect from the medications.  He still have balance issue and had fallen in the month of July.  He reported he bumped his head very slightly.  3 days later he started getting worse, having confusion and was taken to the ED. His head CT did not show any bleeding but his sodium was noted to be 122.  He does have chronic hyponatremia, he was seen by nephrology in the hospital.  He was told that hyponatremia was secondary to cerebral salt wasting.  He was treated and on discharge his sodium level was back to normal at 136.  In the hospital his antiseizure medications level were checked also and there were all within normal limits.  Since discharge from the hospital, he continues to do well.  He reported he is back to his normal self now.     Interval history 09/13/2021:  Patient presents today for follow-up, she is accompanied by his wife.  Last visit with Dr. Jannifer Franklin was in September, at that time plan was to continue current medications.  He is known to have breakthrough seizure, and wife report that the last breakthrough seizure was 6 weeks ago.  He does have Ativan currently at the  facility and usually gets it as soon as the seizure started because he is known to go into status per wife.  Ativan use to stop the seizures   History of present illness: Cameron Potts is an 86 year old right-handed white male with a history of intractable seizures.  He currently is on Dilantin, Vimpat, and Keppra and he continues to have breakthrough seizures.  He last had a brief event that occurred 2 weeks ago.  He had a fall associated with this.  He had a another seizure about a month prior to this.  He has Ativan that he receives as needed at his extended care facility.  His most recent Dilantin level on 12 March 2021 was 18.0.  The patient is also on Coumadin.  He walks with a walker.  He is getting physical therapy on a regular basis.  He has had a general decline in his balance according to his wife and according to the patient.  He has a vagal nerve stimulator in place, this was inserted in 2013.  He tolerates this fairly well.   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 He has never used smokeless tobacco. He reports that he does not currently use alcohol. He reports that he does not use drugs.    Allergies  Allergen Reactions   Depakote [Divalproex Sodium] Other (See Comments)    Arthralgias     Medications:  Current Meds  Medication Sig   acetaminophen (TYLENOL) 325 MG tablet Take 650 mg by mouth every 6 (six) hours as needed for mild pain or moderate pain.   ALPRAZolam (XANAX) 0.25 MG tablet Take 1 tablet (0.25 mg total) by mouth 2 (two) times daily.   brimonidine-timolol (COMBIGAN) 0.2-0.5 % ophthalmic solution Place 1 drop into both eyes every 12 (twelve) hours.    calcium carbonate (TUMS SMOOTHIES) 750 MG chewable tablet Chew 2 tablets by mouth 4 (four) times daily as needed for heartburn.   cholecalciferol (VITAMIN D) 400 units TABS tablet Take 400 Units by mouth 2 (two) times daily.   clobetasol cream (TEMOVATE) 8.25 % Apply 1 application  topically 2 (two) times daily as needed (rash).   DILANTIN 100 MG ER capsule Take 300 mg by mouth. In the evening   econazole nitrate 1 % cream Apply 1 Application topically 2 (two) times daily.   finasteride (PROSCAR) 5 MG tablet Take 5 mg by mouth every morning.   flecainide (TAMBOCOR) 50 MG tablet Take 50 mg by mouth 2 (two) times daily.   fluocinonide ointment (LIDEX) 0.53 % Apply 1 Application topically 2 (two) times daily as needed (rash).   latanoprost (XALATAN) 0.005 % ophthalmic solution Place 1 drop into both eyes at bedtime.   Levetiracetam 750 MG TB24 Take 2,250 mg by mouth daily.   LORazepam (ATIVAN) 2 MG/ML concentrated solution as needed for anxiety or seizure. MAR instructions:If seizure activity, given '2mg'$  IM.If continued seizure activity after 56mn give Ativan '2mg'$  IM again.   phenytoin (DILANTIN) 50 MG tablet Chew 75 mg by mouth daily.   polyethylene glycol (MIRALAX / GLYCOLAX) packet Take 17 g by mouth every other day.   sodium chloride 1 g tablet  Take 1 tablet (1 g total) by mouth 2 (two) times daily with a meal.   tamsulosin (FLOMAX) 0.4 MG CAPS capsule Take 0.4 mg by mouth daily.      ROS: Out of a complete 14 system review of symptoms, the patient complains only of the following symptoms, and all other reviewed systems are negative.  Walking difficulty Seizures  Blood pressure (!) 164/74, pulse 75, height '5\' 9"'$  (1.753 m), weight 173 lb 8 oz (78.7 kg).   Physical Exam  General:  The patient is alert and cooperative at the time of the examination.  Skin: No significant peripheral edema is noted.   Neurologic Exam  Mental status: The patient is alert and oriented x 3 at the time of the examination. The patient has apparent normal recent and remote memory, with an apparently normal attention span and concentration ability.  Cranial nerves: Facial symmetry is present. Speech is normal, no aphasia or dysarthria is noted. Extraocular movements are full. Visual fields are full. Decrease facial movements.   Motor: The patient has good strength in all 4 extremities.  Sensory examination: Soft touch sensation is symmetric on the face, arms, and legs.  Coordination: The patient has good finger-nose-finger and heel-to-shin bilaterally.  Gait and station: The patient has a somewhat wide-based gait, the patient walks with a walker.  With the walker, he has good stride and turns, good stability.  Tandem gait was not attempted.  Romberg is positive, he tends to fall backwards.  Reflexes: Deep tendon reflexes are symmetric.   Assessment/Plan:  1.  Intractable seizures s/p VNS placement  2.  Gait disorder  He is stable on VNS and 3 antiepileptic medications.  I interrogated his VNS and made changes. He tolerated the procedure well. Continue with current medications including Vimpat 200 mg BID, Keppra XR 2250 mg by mouth daily and Dilantin 75 mg morning and 300 mg PM. He continues to get physical therapy.  I will see him in 3 months     Patient Instructions  Continue with Vimpat 200 mg BID  Keppra XR 2250 mg by mouth daily   Dilantin 75 mg morning and 300 mg VNS interrogated and changes made. If breakthrough seizure, will increase either Dilantin to 150/300 mg or Keppra to 3000 mg daily.  Follow up as scheduled in March.    I have spent a total of 30 minutes dedicated to this patient today, preparing to see patient, performing a medically appropriate examination and evaluation, ordering tests and/or medications and procedures, and counseling and educating the patient/family/caregiver; independently interpreting result and communicating results to the family/patient/caregiver; and documenting clinical information in the electronic medical record.   Alric Ran, MD 05/13/2022 2:51 PM  [facility] [address] [place], [place] [address]  Phone [phone] Fax [phone]

## 2022-05-16 ENCOUNTER — Other Ambulatory Visit: Payer: Self-pay | Admitting: Adult Health

## 2022-05-16 DIAGNOSIS — G40919 Epilepsy, unspecified, intractable, without status epilepticus: Secondary | ICD-10-CM

## 2022-05-16 MED ORDER — LACOSAMIDE 200 MG PO TABS
200.0000 mg | ORAL_TABLET | Freq: Two times a day (BID) | ORAL | 2 refills | Status: DC
Start: 2022-05-16 — End: 2022-06-18

## 2022-05-23 ENCOUNTER — Ambulatory Visit: Payer: Medicare Other | Admitting: Neurology

## 2022-06-03 ENCOUNTER — Ambulatory Visit: Payer: PRIVATE HEALTH INSURANCE | Admitting: Cardiovascular Disease

## 2022-06-05 DIAGNOSIS — D692 Other nonthrombocytopenic purpura: Secondary | ICD-10-CM | POA: Diagnosis not present

## 2022-06-05 DIAGNOSIS — L720 Epidermal cyst: Secondary | ICD-10-CM | POA: Diagnosis not present

## 2022-06-05 DIAGNOSIS — Z85820 Personal history of malignant melanoma of skin: Secondary | ICD-10-CM | POA: Diagnosis not present

## 2022-06-05 DIAGNOSIS — L570 Actinic keratosis: Secondary | ICD-10-CM | POA: Diagnosis not present

## 2022-06-05 DIAGNOSIS — L111 Transient acantholytic dermatosis [Grover]: Secondary | ICD-10-CM | POA: Diagnosis not present

## 2022-06-07 ENCOUNTER — Telehealth: Payer: Self-pay | Admitting: Cardiovascular Disease

## 2022-06-07 NOTE — Telephone Encounter (Signed)
Spoke to patient's wife.Appointment scheduled with Dr.Croitoru on a device day 1/22 at 8:20 am.

## 2022-06-07 NOTE — Telephone Encounter (Signed)
Wife called to follow-up on RN's call regarding setting patient up for heart monitor.

## 2022-06-07 NOTE — Telephone Encounter (Addendum)
Patient's wife voiced confusion over whether a heart monitor was to be set up or not. Patient saw Dr. Sallyanne Kuster in September and was to have a 67-monthf/u on a device day. Call wife at [phone]

## 2022-06-18 ENCOUNTER — Encounter: Payer: Self-pay | Admitting: Orthopedic Surgery

## 2022-06-18 ENCOUNTER — Non-Acute Institutional Stay: Payer: Medicare Other | Admitting: Orthopedic Surgery

## 2022-06-18 DIAGNOSIS — L111 Transient acantholytic dermatosis [Grover]: Secondary | ICD-10-CM

## 2022-06-18 DIAGNOSIS — R21 Rash and other nonspecific skin eruption: Secondary | ICD-10-CM | POA: Diagnosis not present

## 2022-06-18 MED ORDER — PREDNISONE 10 MG PO TABS: 20.0000 mg | ORAL_TABLET | Freq: Every day | ORAL | 0 refills | Status: AC

## 2022-06-18 MED ORDER — PREDNISONE 10 MG PO TABS: ORAL_TABLET | ORAL | 0 refills | Status: DC

## 2022-06-18 NOTE — Addendum Note (Signed)
Addended byWindell Moulding E on: 06/18/2022 02:51 PM   Modules accepted: Orders

## 2022-06-18 NOTE — Progress Notes (Addendum)
Location:  [facility] Room Number: [phone] Place of Service:  ALF [phone]) Provider:  Windell Moulding, NP   Patient Care Team: [name], MD as PCP - General (Internal Medicine) Sanda Klein, MD as PCP - Cardiology (Cardiology) Rutherford Guys, MD as Consulting Physician (Ophthalmology) Kathrynn Ducking, MD (Inactive) as Consulting Physician (Neurology) Gayland Curry, DO (Geriatric Medicine)  Extended Emergency Contact Information Primary Emergency Contact: Derner,[name] Address: [address]          [place] [address] Johnnette Litter of [place] Phone: [phone] Mobile Phone: [phone] Relation: Spouse Secondary Emergency Contact: Rottinghaus,Taray Mobile Phone: [phone] Relation: Son  Code Status:  DNR Goals of care: Advanced Directive information    06/18/2022   10:40 AM  Advanced Directives  Does Patient Have a Medical Advance Directive? Yes  Type of Advance Directive Living will;Out of facility DNR (pink MOST or yellow form)  Does patient want to make changes to medical advance directive? No - Patient declined  Pre-existing out of facility DNR order (yellow form or pink MOST form) Pink MOST form placed in chart (order not valid for inpatient use);Yellow form placed in chart (order not valid for inpatient use)     Chief Complaint  Patient presents with   Acute Visit    Patient is begin treated today for itching    HPI:  Pt is a 87 y.o. male seen today for acute visit due to increased itching.   H/o grover's disease. He reports increased itching with rash to extremities, trunk and back x 2 days. He has been applying triamcinolone cream daily. He believes rash has improved within last 24 hours, but itching has not improved. Denies changes to soaps, detergents.   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[place])    Urinary urgency    Past Surgical History:  Procedure Laterality Date   CARDIAC DEFIBRILLATOR PLACEMENT     cataract surgery     COLONOSCOPY     CRANIOTOMY Right    right anterior temporal resection   defibrillator replaced  March 2016   IMPLANTATION VAGAL NERVE STIMULATOR  June 2016   NASAL SINUS SURGERY     TONSILLECTOMY     age 68   VASECTOMY  55    Allergies  Allergen Reactions   Depakote [Divalproex Sodium] Other (See Comments)    Arthralgias     Outpatient Encounter Medications as of 06/18/2022  Medication Sig   acetaminophen (TYLENOL) 325 MG tablet Take 650 mg by mouth every 6 (six) hours as needed for mild pain or moderate pain.   ALPRAZolam (XANAX) 0.25 MG tablet Take 1 tablet (0.25 mg total) by mouth 2 (two) times daily.   brimonidine-timolol (COMBIGAN) 0.2-0.5 % ophthalmic solution Place 1 drop into both eyes every 12 (twelve) hours.    calcium carbonate (TUMS SMOOTHIES) 750 MG chewable tablet Chew 2 tablets by mouth 4 (four) times daily as needed for  heartburn.   cholecalciferol (VITAMIN D) 400 units TABS tablet Take 400 Units by mouth 2 (two) times daily.   clobetasol cream (TEMOVATE) 2.40 % Apply 1 application  topically 2 (two) times daily as needed (rash).   DILANTIN 100 MG ER capsule Take 300 mg by mouth. In the evening   econazole nitrate 1 % cream Apply 1 Application topically 2 (two) times daily.   finasteride (PROSCAR) 5 MG tablet Take 5 mg by mouth every morning.   flecainide (TAMBOCOR) 50 MG tablet Take 50 mg by mouth 2 (two) times daily.   fluocinonide ointment (LIDEX) 9.73 % Apply 1 Application topically 2 (two) times daily as  needed (rash).   latanoprost (XALATAN) 0.005 % ophthalmic solution Place 1 drop into both eyes at bedtime.   Levetiracetam 750 MG TB24 Take 2,250 mg by mouth daily.   LORazepam (ATIVAN) 2 MG/ML concentrated solution as needed for anxiety or seizure. MAR instructions:If seizure activity, given '2mg'$  IM.If continued seizure activity after 50mn give Ativan '2mg'$  IM again.   phenytoin (DILANTIN) 50 MG tablet Chew 75 mg by mouth daily.   polyethylene glycol (MIRALAX / GLYCOLAX) packet Take 17 g by mouth every other day.   sodium chloride 1 g tablet Take 1 tablet (1 g total) by mouth 2 (two) times daily with a meal.   tamsulosin (FLOMAX) 0.4 MG CAPS capsule Take 0.4 mg by mouth daily.   [DISCONTINUED] lacosamide (VIMPAT) 200 MG TABS tablet Take 1 tablet (200 mg total) by mouth 2 (two) times daily.   No facility-administered encounter medications on file as of 06/18/2022.    Review of Systems  Constitutional:  Negative for chills, fatigue and fever.  Respiratory:  Negative for cough and shortness of breath.   Cardiovascular:  Negative for chest pain and leg swelling.  Skin:  Positive for color change and rash.       itching  Psychiatric/Behavioral:  Negative for confusion and dysphoric mood. The patient is not nervous/anxious.     Immunization History  Administered Date(s) Administered   Fluad Quad(high Dose 65+) 03/22/2022   Influenza, High Dose Seasonal PF 04/07/2020   Influenza, Seasonal, Injecte, Preservative Fre 02/22/2010   Influenza,inj,Quad PF,6+ Mos 04/07/2018, 04/15/2019   Influenza-Unspecified 03/17/2013, 02/24/2014, 02/16/2015, 03/21/2016, 03/17/2017, 04/07/2018, 03/21/2021   Moderna SARS-COV2 Booster Vaccination 01/11/2021, 03/28/2021   Moderna Sars-Covid-2 Vaccination 06/28/2019, 07/27/2019, 05/01/2020, 04/22/2022   Pneumococcal Conjugate-13 07/05/2013   Pneumococcal Polysaccharide-23 06/17/2006   Td 06/18/2007   Tdap 12/07/2015, 09/17/2020   Zoster Recombinat (Shingrix)  12/04/2016   Zoster, Live 10/06/2008   Pertinent  Health Maintenance Due  Topic Date Due   INFLUENZA VACCINE  Completed      01/25/2022    7:41 PM 01/26/2022    9:30 AM 01/26/2022    7:59 PM 01/27/2022    8:00 AM 06/18/2022   10:38 AM  Fall Risk  Falls in the past year?     0  Was there an injury with Fall?     0  Fall Risk Category Calculator     0  Fall Risk Category     Low  Patient Fall Risk Level High fall risk High fall risk High fall risk High fall risk High fall risk  Patient at Risk for Falls Due to     History of fall(s)  Fall risk Follow up     Falls evaluation completed   Functional Status Survey:    Vitals:   06/18/22 1027  BP: (!) 166/76  Pulse: 68  Resp: 16  Temp: (!) 97.2 F (36.2 C)  SpO2: 98%  Weight: 172 lb 8 oz (78.2 kg)  Height: '5\' 5"'$  (1.651 m)   Body mass index is 28.71 kg/m. Physical Exam Vitals reviewed.  Constitutional:      General: He is not in acute distress. HENT:     Head: Normocephalic.  Eyes:     General:        Right eye: No discharge.        Left eye: No discharge.  Cardiovascular:     Rate and Rhythm: Normal rate. Rhythm irregular.     Pulses: Normal pulses.     Heart sounds: Normal heart sounds.  Pulmonary:     Effort: Pulmonary effort is normal. No respiratory distress.     Breath sounds: Normal breath sounds. No wheezing.  Abdominal:     General: Bowel sounds are normal.     Palpations: Abdomen is soft.  Musculoskeletal:     Cervical back: Neck supple.     Right lower leg: No edema.     Left lower leg: No edema.  Skin:    Capillary Refill: Capillary refill takes less than 2 seconds.     Findings: Erythema and rash present.     Comments: Scattered smooth papules to extremities, trunk and lower back. Vary in size, some areas with plaques. Extremities with purpura rash to bilateral forearms. No scratch marks or skin breakdown.   Neurological:     General: No focal deficit present.     Mental Status: He is alert and  oriented to person, place, and time.  Psychiatric:        Mood and Affect: Mood normal.        Behavior: Behavior normal.     Labs reviewed: Recent Labs    01/25/22 0647 01/26/22 0547 01/27/22 0333 02/01/22 0000 02/07/22 0000 03/07/22 0000  NA 123* 129* 131* 136* 136* 138  K 4.5 4.5 4.7 4.5 4.4 4.4  CL 95* 99 104 99 100 103  CO2 21* 23 17* 23* 22 25*  GLUCOSE 95 100* 108*  --   --   --   BUN '9 10 17 13 14 15  '$ CREATININE 0.60* 0.64 0.76 0.7 0.8 0.7  CALCIUM 8.5* 8.7* 7.6* 9.0 8.8 8.9   No results for input(s): "AST", "ALT", "ALKPHOS", "BILITOT", "PROT", "ALBUMIN" in the last 8760 hours. Recent Labs    01/19/22 1139 01/19/22 1143 01/20/22 0250 01/21/22 0844 02/01/22 0000  WBC 5.3  --  4.1 4.3 7.4  NEUTROABS 2.4  --   --   --   --   HGB 11.6*   < > 11.7* 11.6* 12.1*  HCT 32.2*   < > 31.7* 31.5* 35*  MCV 90.4  --  91.9 88.5  --   PLT 234  --  213 236 242   < > = values in this interval not displayed.   Lab Results  Component Value Date   TSH 2.685 01/19/2022   No results found for: "HGBA1C" Lab Results  Component Value Date   CHOL 214 (A) 05/15/2021   HDL 61 05/15/2021   LDLCALC 141 05/15/2021   TRIG 79 05/15/2021    Significant Diagnostic Results in last 30 days:  No results found.  Assessment/Plan 1. Rash and nonspecific skin eruption - suspect related to Grover's disease - involves extremities, trunk and lower back - itching not improved with triamcinolone cream - will start prednisone 20 mg x 5 days> reduced dose due  to dilantin  2. Grover's disease - see above    Family/ staff Communication: plan discussed with patient and nurse  Labs/tests ordered:  none

## 2022-06-24 ENCOUNTER — Encounter: Payer: Self-pay | Admitting: Adult Health

## 2022-06-24 ENCOUNTER — Non-Acute Institutional Stay: Payer: Medicare Other | Admitting: Adult Health

## 2022-06-24 DIAGNOSIS — R7989 Other specified abnormal findings of blood chemistry: Secondary | ICD-10-CM | POA: Diagnosis not present

## 2022-06-24 DIAGNOSIS — L111 Transient acantholytic dermatosis [Grover]: Secondary | ICD-10-CM

## 2022-06-24 DIAGNOSIS — I10 Essential (primary) hypertension: Secondary | ICD-10-CM

## 2022-06-24 LAB — CBC AND DIFFERENTIAL
HCT: 37 — AB (ref 41–53)
Hemoglobin: 12.4 — AB (ref 13.5–17.5)
Neutrophils Absolute: 4
Platelets: 257 10*3/uL (ref 150–400)
WBC: 7.3

## 2022-06-24 LAB — BASIC METABOLIC PANEL
BUN: 16 (ref 4–21)
CO2: 26 — AB (ref 13–22)
Chloride: 100 (ref 99–108)
Creatinine: 0.7 (ref 0.6–1.3)
Glucose: 99
Potassium: 5.1 mEq/L (ref 3.5–5.1)
Sodium: 139 (ref 137–147)

## 2022-06-24 LAB — LIPID PANEL
Cholesterol: 214 — AB (ref 0–200)
HDL: 83 — AB (ref 35–70)
Triglycerides: 59 (ref 40–160)

## 2022-06-24 LAB — COMPREHENSIVE METABOLIC PANEL
Calcium: 9.4 (ref 8.7–10.7)
eGFR: >90

## 2022-06-24 LAB — CBC: RBC: 3.82 — AB (ref 3.87–5.11)

## 2022-06-24 NOTE — Progress Notes (Unsigned)
Location:  [facility] Room Number: 825K Place of Service:  ALF [phone]) Provider:  Wert,Christina NP  [name], MD  Patient Care Team: [name], MD as PCP - General (Internal Medicine) Sanda Klein, MD as PCP - Cardiology (Cardiology) Rutherford Guys, MD as Consulting Physician (Ophthalmology) Kathrynn Ducking, MD (Inactive) as Consulting Physician (Neurology) Gayland Curry, DO (Geriatric Medicine)  Extended Emergency Contact Information Primary Emergency Contact: Busser,[name] Address: [address]          [place] [address] Johnnette Litter of [place] Phone: [phone] Mobile Phone: [phone] Relation: Spouse Secondary Emergency Contact: Maillet,Jaremy Mobile Phone: [phone] Relation: Son  Code Status:  DNR Goals of care: Advanced Directive information    06/24/2022   11:42 AM  Advanced Directives  Does Patient Have a Medical Advance Directive? Yes  Type of Advance Directive Living will;Out of facility DNR (pink MOST or yellow form)  Does patient want to make changes to medical advance directive? No - Patient declined  Pre-existing out of facility DNR order (yellow form or pink MOST form) Pink MOST form placed in chart (order not valid for inpatient use);Yellow form placed in chart (order not valid for inpatient use)     Chief Complaint  Patient presents with   Acute Visit    Rash     HPI:  Pt is a 87 y.o. male seen today for an acute visit for an itchy rash  Pt has an extensive hx of skin rashes including Grover's disease and tinea pedis.  Reports maculopapular rash to trunk and arms with itching.  Has tried a prednisone taper (06/18/22) for rash without relief. No wheezing or sob No change in meds He is trying another detergent starting today I called [facility] and they confirmed there are no new suppliers for his meds either.   Also BP 1/7 183/104, repeat 1/8 190/78 No chest pain sob  or doe. No edema Labs drawn this am pending.  Hx of falls and orthostatic hypotension    Past Medical History:  Diagnosis Date   Abnormality of gait 12/21/2013   Atrial fibrillation (HCC)    Blind left eye    Carcinoma in situ of prostate    Cardiac arrest ([facility])    Glaucoma    Hypercholesteremia    Hyperlipidemia    Metabolic encephalopathy    Mild left ventricular systolic dysfunction    Presence of other specified functional implants    PSA elevation    Right frontal lobe lesion    Seizures (HCC)    Tinea pedis    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grover)    Urinary urgency    Past Surgical History:  Procedure Laterality Date   CARDIAC DEFIBRILLATOR PLACEMENT     cataract surgery     COLONOSCOPY     CRANIOTOMY Right    right anterior temporal resection   defibrillator replaced  March 2016   IMPLANTATION VAGAL NERVE STIMULATOR  June 2016   NASAL SINUS SURGERY     TONSILLECTOMY     age 37   VASECTOMY  72    Allergies  Allergen Reactions   Depakote [Divalproex Sodium] Other (See Comments)    Arthralgias     Outpatient Encounter Medications as of 06/24/2022  Medication Sig   acetaminophen (TYLENOL) 325 MG tablet Take 650 mg by mouth every 6 (six) hours as needed for mild pain or moderate pain.   ALPRAZolam (XANAX) 0.25 MG tablet Take 1  tablet (0.25 mg total) by mouth 2 (two) times daily.   brimonidine-timolol (COMBIGAN) 0.2-0.5 % ophthalmic solution Place 1 drop into both eyes every 12 (twelve) hours.    calcium carbonate (TUMS SMOOTHIES) 750 MG chewable tablet Chew 2 tablets by mouth 4 (four) times daily as needed for heartburn.   cholecalciferol (VITAMIN D) 400 units TABS tablet Take 400 Units by mouth 2 (two) times daily.   clindamycin (CLEOCIN T) 1 % lotion Apply 1 Application topically 2 (two) times daily.   clobetasol cream (TEMOVATE) 2.58 % Apply 1 application  topically 2 (two) times daily as needed (rash).   DILANTIN 100 MG ER capsule Take 300 mg by mouth.  In the evening   econazole nitrate 1 % cream Apply 1 Application topically 2 (two) times daily.   finasteride (PROSCAR) 5 MG tablet Take 5 mg by mouth every morning.   flecainide (TAMBOCOR) 50 MG tablet Take 50 mg by mouth 2 (two) times daily.   fluocinonide ointment (LIDEX) 5.27 % Apply 1 Application topically 2 (two) times daily as needed (rash).   lacosamide (VIMPAT) 200 MG TABS tablet Take 200 mg by mouth 2 (two) times daily.   latanoprost (XALATAN) 0.005 % ophthalmic solution Place 1 drop into both eyes at bedtime.   Levetiracetam 750 MG TB24 Take 2,250 mg by mouth daily.   LORazepam (ATIVAN) 2 MG/ML concentrated solution as needed for anxiety or seizure. MAR instructions:If seizure activity, given '2mg'$  IM.If continued seizure activity after 53mn give Ativan '2mg'$  IM again.   miconazole (MICOTIN) 2 % powder Apply 1 Application topically as needed for itching.   phenytoin (DILANTIN) 50 MG tablet Chew 75 mg by mouth daily.   polyethylene glycol (MIRALAX / GLYCOLAX) packet Take 17 g by mouth every other day.   sodium chloride 1 g tablet Take 1 tablet (1 g total) by mouth 2 (two) times daily with a meal.   tamsulosin (FLOMAX) 0.4 MG CAPS capsule Take 0.4 mg by mouth daily.   triamcinolone cream (KENALOG) 0.1 % Apply 1 Application topically 2 (two) times daily.   No facility-administered encounter medications on file as of 06/24/2022.    Review of Systems  Constitutional:  Negative for activity change, appetite change, chills, diaphoresis, fatigue, fever and unexpected weight change.  Respiratory:  Negative for cough, shortness of breath, wheezing and stridor.   Cardiovascular:  Negative for chest pain, palpitations and leg swelling.  Gastrointestinal:  Negative for abdominal distention, abdominal pain, constipation and diarrhea.  Genitourinary:  Negative for difficulty urinating and dysuria.  Musculoskeletal:  Positive for gait problem. Negative for arthralgias, back pain, joint swelling and  myalgias.  Skin:  Positive for rash.  Neurological:  Negative for dizziness, seizures, syncope, facial asymmetry, speech difficulty, weakness and headaches.  Hematological:  Negative for adenopathy. Does not bruise/bleed easily.  Psychiatric/Behavioral:  Negative for agitation, behavioral problems and confusion.     Immunization History  Administered Date(s) Administered   Fluad Quad(high Dose 65+) 03/22/2022   Influenza, High Dose Seasonal PF 04/07/2020   Influenza, Seasonal, Injecte, Preservative Fre 02/22/2010   Influenza,inj,Quad PF,6+ Mos 04/07/2018, 04/15/2019   Influenza-Unspecified 03/17/2013, 02/24/2014, 02/16/2015, 03/21/2016, 03/17/2017, 04/07/2018, 03/21/2021   Moderna SARS-COV2 Booster Vaccination 01/11/2021, 03/28/2021   Moderna Sars-Covid-2 Vaccination 06/28/2019, 07/27/2019, 05/01/2020, 04/22/2022   Pneumococcal Conjugate-13 07/05/2013   Pneumococcal Polysaccharide-23 06/17/2006   Td 06/18/2007   Tdap 12/07/2015, 09/17/2020   Zoster Recombinat (Shingrix) 12/04/2016   Zoster, Live 10/06/2008   Pertinent  Health Maintenance Due  Topic Date Due  INFLUENZA VACCINE  Completed      01/25/2022    7:41 PM 01/26/2022    9:30 AM 01/26/2022    7:59 PM 01/27/2022    8:00 AM 06/18/2022   10:38 AM  Fall Risk  Falls in the past year?     0  Was there an injury with Fall?     0  Fall Risk Category Calculator     0  Fall Risk Category     Low  Patient Fall Risk Level High fall risk High fall risk High fall risk High fall risk High fall risk  Patient at Risk for Falls Due to     History of fall(s)  Fall risk Follow up     Falls evaluation completed   Functional Status Survey:    Vitals:   06/24/22 1133  BP: (!) 183/104  Pulse: 62  Resp: 17  Temp: (!) 97.2 F (36.2 C)  TempSrc: Temporal  SpO2: 98%  Weight: 172 lb 12.8 oz (78.4 kg)  Height: '5\' 5"'$  (1.651 m)   Body mass index is 28.76 kg/m. Physical Exam Vitals and nursing note reviewed.  Constitutional:       General: He is not in acute distress.    Appearance: He is not diaphoretic.  HENT:     Head: Normocephalic and atraumatic.  Neck:     Thyroid: No thyromegaly.     Vascular: No JVD.     Trachea: No tracheal deviation.  Cardiovascular:     Rate and Rhythm: Normal rate. Rhythm irregular.     Heart sounds: No murmur heard. Pulmonary:     Effort: Pulmonary effort is normal. No respiratory distress.     Breath sounds: Normal breath sounds. No wheezing.  Abdominal:     General: Bowel sounds are normal. There is no distension.     Palpations: Abdomen is soft.     Tenderness: There is no abdominal tenderness.  Musculoskeletal:     Right lower leg: No edema.     Left lower leg: No edema.  Lymphadenopathy:     Cervical: No cervical adenopathy.  Skin:    General: Skin is warm and dry.     Findings: Rash (maculopapular to trunk and arms. Erythema to both feet with dry scaly skin) present.  Neurological:     Mental Status: He is alert and oriented to person, place, and time.     Cranial Nerves: No cranial nerve deficit.  Psychiatric:        Mood and Affect: Mood normal.     Labs reviewed: Recent Labs    01/25/22 0647 01/26/22 0547 01/27/22 0333 02/01/22 0000 02/07/22 0000 03/07/22 0000  NA 123* 129* 131* 136* 136* 138  K 4.5 4.5 4.7 4.5 4.4 4.4  CL 95* 99 104 99 100 103  CO2 21* 23 17* 23* 22 25*  GLUCOSE 95 100* 108*  --   --   --   BUN '9 10 17 13 14 15  '$ CREATININE 0.60* 0.64 0.76 0.7 0.8 0.7  CALCIUM 8.5* 8.7* 7.6* 9.0 8.8 8.9   No results for input(s): "AST", "ALT", "ALKPHOS", "BILITOT", "PROT", "ALBUMIN" in the last 8760 hours. Recent Labs    01/19/22 1139 01/19/22 1143 01/20/22 0250 01/21/22 0844 02/01/22 0000  WBC 5.3  --  4.1 4.3 7.4  NEUTROABS 2.4  --   --   --   --   HGB 11.6*   < > 11.7* 11.6* 12.1*  HCT 32.2*   < >  31.7* 31.5* 35*  MCV 90.4  --  91.9 88.5  --   PLT 234  --  213 236 242   < > = values in this interval not displayed.   Lab Results   Component Value Date   TSH 2.685 01/19/2022   No results found for: "HGBA1C" Lab Results  Component Value Date   CHOL 214 (A) 05/15/2021   HDL 61 05/15/2021   LDLCALC 141 05/15/2021   TRIG 79 05/15/2021    Significant Diagnostic Results in last 30 days:  No results found.  Assessment/Plan  1. Grover's disease Continues with rash to trunk and itching Did not feel prednisone helped Due to high bp will not prescribe oral steroids again today, consider dexamethasone later. Refer back to derm if not better  - doxycycline (VIBRA-TABS) 100 MG tablet; Take 1 tablet (100 mg total) by mouth 2 (two) times daily for 14 days.  Dispense: 28 tablet; Refill: 0 - clobetasol ointment (TEMOVATE) 0.05 %; Apply 1 Application topically 2 (two) times daily for 14 days.  Dispense: 30 g; Refill: 0 - cetirizine (ZYRTEC) 10 MG tablet; Take 1 tablet (10 mg total) by mouth at bedtime.  Dispense: 30 tablet; Refill: 0  2. Essential hypertension Manual BP bid x 1 week and report to me Has hx of orthostatic hypotension, will need to have loose control  - hydrALAZINE (APRESOLINE) 10 MG tablet; Take 1 tablet (10 mg total) by mouth 2 (two) times daily as needed.  Dispense: 30 tablet; Refill: 0   Family/ staff Communication: resident   Labs/tests ordered: Labs pending today for CBC BMP   Total time 62mn:  time greater than 50% of total time spent doing pt counseling and coordination of care

## 2022-06-25 ENCOUNTER — Encounter: Payer: Medicare Other | Admitting: Internal Medicine

## 2022-06-25 ENCOUNTER — Encounter: Payer: Self-pay | Admitting: Adult Health

## 2022-06-25 MED ORDER — HYDRALAZINE HCL 10 MG PO TABS: 10.0000 mg | ORAL_TABLET | Freq: Two times a day (BID) | ORAL | 0 refills | Status: DC | PRN

## 2022-06-25 MED ORDER — CLOBETASOL PROPIONATE 0.05 % EX OINT: 1.0000 | TOPICAL_OINTMENT | Freq: Two times a day (BID) | CUTANEOUS | 0 refills | Status: DC

## 2022-06-25 MED ORDER — CETIRIZINE HCL 10 MG PO TABS: 10.0000 mg | ORAL_TABLET | Freq: Every day | ORAL | 0 refills | Status: DC

## 2022-06-25 MED ORDER — DOXYCYCLINE HYCLATE 100 MG PO TABS: 100.0000 mg | ORAL_TABLET | Freq: Two times a day (BID) | ORAL | 0 refills | Status: DC

## 2022-06-27 ENCOUNTER — Non-Acute Institutional Stay: Payer: Medicare Other | Admitting: Adult Health

## 2022-06-27 ENCOUNTER — Encounter: Payer: Self-pay | Admitting: Adult Health

## 2022-06-27 DIAGNOSIS — L111 Transient acantholytic dermatosis [Grover]: Secondary | ICD-10-CM | POA: Diagnosis not present

## 2022-06-27 MED ORDER — PREDNISONE 10 MG PO TABS: ORAL_TABLET | ORAL | 0 refills | Status: AC

## 2022-06-27 NOTE — Progress Notes (Signed)
Location:   [facility] Room Number: [place] of Service:  ALF [phone]) Provider:  Royal Hawthorn, NP  [name], MD  Patient Care Team: [name], MD as PCP - General (Internal Medicine) Sanda Klein, MD as PCP - Cardiology (Cardiology) Rutherford Guys, MD as Consulting Physician (Ophthalmology) Kathrynn Ducking, MD (Inactive) as Consulting Physician (Neurology) Gayland Curry, DO (Geriatric Medicine)  Extended Emergency Contact Information Primary Emergency Contact: Dohmen,[name] Address: [address] [place] [address] Johnnette Litter of [place] Phone: [phone] Mobile Phone: [phone] Relation: Spouse Secondary Emergency Contact: Sobotta,Ivor Mobile Phone: [phone] Relation: Son  Code Status:  DNR Goals of care: Advanced Directive information    06/27/2022   10:43 AM  Advanced Directives  Does Patient Have a Medical Advance Directive? Yes  Type of Advance Directive Living will;Out of facility DNR (pink MOST or yellow form)  Does patient want to make changes to medical advance directive? No - Patient declined  Pre-existing out of facility DNR order (yellow form or pink MOST form) Pink MOST form placed in chart (order not valid for inpatient use);Yellow form placed in chart (order not valid for inpatient use)     Chief Complaint  Patient presents with   Acute Visit    Rash    HPI:  Pt is a 87 y.o. male seen today for an acute visit for rash  He has a hx of Grover's disease with exacerbation this past two weeks with maculopapular itchy rash to trunk and arms. He has tried low dose prednisone (due to dilantin) with no relief. He has been on doxycycline and clobetasol for 4 days with only mild relief. Also on zyrtec.   He is going to see dermatology 1/17 and is requesting something else to help until then. He has not had a change in meds. No change in vendor per pharmacy. Has tried  hypoallergenic detergent.   BP improved 116/74 132/86  Past Medical History:  Diagnosis Date   Abnormality of gait 12/21/2013   Atrial fibrillation (HCC)    Blind left eye    Carcinoma in situ of prostate    Cardiac arrest ([facility])    Glaucoma    Hypercholesteremia    Hyperlipidemia    Metabolic encephalopathy    Mild left ventricular systolic dysfunction    Presence of other specified functional implants    PSA elevation    Right frontal lobe lesion    Seizures (HCC)    Tinea pedis    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grover)    Urinary urgency    Past Surgical History:  Procedure Laterality Date   CARDIAC DEFIBRILLATOR PLACEMENT     cataract surgery     COLONOSCOPY     CRANIOTOMY Right    right anterior temporal resection   defibrillator replaced  March 2016   IMPLANTATION VAGAL NERVE STIMULATOR  June 2016   NASAL SINUS SURGERY     TONSILLECTOMY     age 60   VASECTOMY  36    Allergies  Allergen Reactions   Depakote [Divalproex Sodium] Other (See Comments)    Arthralgias     Allergies as of 06/27/2022       Reactions   Depakote [divalproex Sodium] Other (See Comments)   Arthralgias        Medication List        Accurate as of June 27, 2022 10:55 AM. If  you have any questions, ask your nurse or doctor.          acetaminophen 325 MG tablet Commonly known as: TYLENOL Take 650 mg by mouth every 6 (six) hours as needed for mild pain or moderate pain.   ALPRAZolam 0.25 MG tablet Commonly known as: XANAX Take 1 tablet (0.25 mg total) by mouth 2 (two) times daily.   brimonidine-timolol 0.2-0.5 % ophthalmic solution Commonly known as: COMBIGAN Place 1 drop into both eyes every 12 (twelve) hours.   cetirizine 10 MG tablet Commonly known as: ZYRTEC Take 1 tablet (10 mg total) by mouth at bedtime.   cholecalciferol 10 MCG (400 UNIT) Tabs tablet Commonly known as: VITAMIN D3 Take 400 Units by mouth 2 (two) times daily.   clindamycin 1 %  lotion Commonly known as: CLEOCIN T Apply 1 Application topically 2 (two) times daily.   clobetasol cream 0.05 % Commonly known as: TEMOVATE Apply 1 application  topically 2 (two) times daily as needed (rash).   clobetasol ointment 0.05 % Commonly known as: TEMOVATE Apply 1 Application topically 2 (two) times daily for 14 days.   Dilantin 100 MG ER capsule Generic drug: phenytoin Take 300 mg by mouth. In the evening   doxycycline 100 MG tablet Commonly known as: VIBRA-TABS Take 1 tablet (100 mg total) by mouth 2 (two) times daily for 14 days.   econazole nitrate 1 % cream Apply 1 Application topically 2 (two) times daily.   finasteride 5 MG tablet Commonly known as: PROSCAR Take 5 mg by mouth every morning.   flecainide 50 MG tablet Commonly known as: TAMBOCOR Take 50 mg by mouth 2 (two) times daily.   fluocinonide ointment 0.05 % Commonly known as: LIDEX Apply 1 Application topically 2 (two) times daily as needed (rash).   hydrALAZINE 10 MG tablet Commonly known as: APRESOLINE Take 1 tablet (10 mg total) by mouth 2 (two) times daily as needed.   lacosamide 200 MG Tabs tablet Commonly known as: VIMPAT Take 200 mg by mouth 2 (two) times daily.   latanoprost 0.005 % ophthalmic solution Commonly known as: XALATAN Place 1 drop into both eyes at bedtime.   levETIRAcetam 750 MG 24 hr tablet Commonly known as: KEPPRA XR Take 2,250 mg by mouth daily.   LORazepam 2 MG/ML concentrated solution Commonly known as: ATIVAN as needed for anxiety or seizure. MAR instructions:If seizure activity, given '2mg'$  IM.If continued seizure activity after 31mn give Ativan '2mg'$  IM again.   miconazole 2 % powder Commonly known as: MICOTIN Apply 1 Application topically as needed for itching.   phenytoin 50 MG tablet Commonly known as: DILANTIN Chew 75 mg by mouth daily.   polyethylene glycol 17 g packet Commonly known as: MIRALAX / GLYCOLAX Take 17 g by mouth every other day. And as  needed   sodium chloride 1 g tablet Take 1 tablet (1 g total) by mouth 2 (two) times daily with a meal.   tamsulosin 0.4 MG Caps capsule Commonly known as: FLOMAX Take 0.4 mg by mouth daily.   triamcinolone cream 0.1 % Commonly known as: KENALOG Apply 1 Application topically 2 (two) times daily.   Tums Smoothies 750 MG chewable tablet Generic drug: calcium carbonate Chew 2 tablets by mouth 4 (four) times daily as needed for heartburn.        Review of Systems  Constitutional:  Negative for activity change, appetite change, chills, diaphoresis, fatigue, fever and unexpected weight change.  Respiratory:  Negative for cough, shortness of breath,  wheezing and stridor.   Cardiovascular:  Negative for chest pain, palpitations and leg swelling.  Gastrointestinal:  Negative for abdominal distention, abdominal pain, constipation and diarrhea.  Genitourinary:  Negative for difficulty urinating and dysuria.  Musculoskeletal:  Positive for gait problem. Negative for arthralgias, back pain, joint swelling and myalgias.  Skin:  Positive for rash.  Neurological:  Negative for dizziness, seizures, syncope, facial asymmetry, speech difficulty, weakness and headaches.  Hematological:  Negative for adenopathy. Does not bruise/bleed easily.  Psychiatric/Behavioral:  Negative for agitation, behavioral problems and confusion.     Immunization History  Administered Date(s) Administered   Fluad Quad(high Dose 65+) 03/22/2022   Influenza, High Dose Seasonal PF 04/07/2020   Influenza, Seasonal, Injecte, Preservative Fre 02/22/2010   Influenza,inj,Quad PF,6+ Mos 04/07/2018, 04/15/2019   Influenza-Unspecified 03/17/2013, 02/24/2014, 02/16/2015, 03/21/2016, 03/17/2017, 04/07/2018, 03/21/2021   Moderna SARS-COV2 Booster Vaccination 01/11/2021, 03/28/2021   Moderna Sars-Covid-2 Vaccination 06/28/2019, 07/27/2019, 05/01/2020, 04/22/2022   Pneumococcal Conjugate-13 07/05/2013   Pneumococcal  Polysaccharide-23 06/17/2006   Td 06/18/2007   Tdap 12/07/2015, 09/17/2020   Zoster Recombinat (Shingrix) 12/04/2016   Zoster, Live 10/06/2008   Pertinent  Health Maintenance Due  Topic Date Due   INFLUENZA VACCINE  Completed      01/25/2022    7:41 PM 01/26/2022    9:30 AM 01/26/2022    7:59 PM 01/27/2022    8:00 AM 06/18/2022   10:38 AM  Fall Risk  Falls in the past year?     0  Was there an injury with Fall?     0  Fall Risk Category Calculator     0  Fall Risk Category     Low  Patient Fall Risk Level High fall risk High fall risk High fall risk High fall risk High fall risk  Patient at Risk for Falls Due to     History of fall(s)  Fall risk Follow up     Falls evaluation completed   Functional Status Survey:    Vitals:   06/27/22 1040  BP: 116/74  Pulse: 82  Resp: 17  Temp: (!) 97.2 F (36.2 C)  SpO2: 97%  Weight: 172 lb 12.8 oz (78.4 kg)  Height: '5\' 5"'$  (1.651 m)   Body mass index is 28.76 kg/m. Physical Exam Vitals and nursing note reviewed.  Constitutional:      General: He is not in acute distress.    Appearance: He is not diaphoretic.  HENT:     Head: Normocephalic and atraumatic.  Neck:     Thyroid: No thyromegaly.     Vascular: No JVD.     Trachea: No tracheal deviation.  Cardiovascular:     Rate and Rhythm: Normal rate. Rhythm irregular.     Heart sounds: No murmur heard. Pulmonary:     Effort: Pulmonary effort is normal. No respiratory distress.     Breath sounds: Normal breath sounds. No wheezing.  Abdominal:     General: Bowel sounds are normal. There is no distension.     Palpations: Abdomen is soft.     Tenderness: There is no abdominal tenderness.  Lymphadenopathy:     Cervical: No cervical adenopathy.  Skin:    General: Skin is warm and dry.     Findings: Rash (maculopapular rash to trunk and arms.) present.  Neurological:     Mental Status: He is alert and oriented to person, place, and time.     Cranial Nerves: No cranial nerve  deficit.     Labs reviewed: Recent  Labs    01/25/22 0647 01/26/22 0547 01/27/22 0333 02/01/22 0000 02/07/22 0000 03/07/22 0000  NA 123* 129* 131* 136* 136* 138  K 4.5 4.5 4.7 4.5 4.4 4.4  CL 95* 99 104 99 100 103  CO2 21* 23 17* 23* 22 25*  GLUCOSE 95 100* 108*  --   --   --   BUN '9 10 17 13 14 15  '$ CREATININE 0.60* 0.64 0.76 0.7 0.8 0.7  CALCIUM 8.5* 8.7* 7.6* 9.0 8.8 8.9   No results for input(s): "AST", "ALT", "ALKPHOS", "BILITOT", "PROT", "ALBUMIN" in the last 8760 hours. Recent Labs    01/19/22 1139 01/19/22 1143 01/20/22 0250 01/21/22 0844 02/01/22 0000  WBC 5.3  --  4.1 4.3 7.4  NEUTROABS 2.4  --   --   --   --   HGB 11.6*   < > 11.7* 11.6* 12.1*  HCT 32.2*   < > 31.7* 31.5* 35*  MCV 90.4  --  91.9 88.5  --   PLT 234  --  213 236 242   < > = values in this interval not displayed.   Lab Results  Component Value Date   TSH 2.685 01/19/2022   No results found for: "HGBA1C" Lab Results  Component Value Date   CHOL 214 (A) 05/15/2021   HDL 61 05/15/2021   LDLCALC 141 05/15/2021   TRIG 79 05/15/2021    Significant Diagnostic Results in last 30 days:  No results found.  Assessment/Plan  1. Grover's disease Pt has excessive itching and scratching with only minimal improvement with doxycycline.  Will try higher dose taper of prednisone  Continue zyrtec and doxycyline to complete 2 week course.  - predniSONE (DELTASONE) 10 MG tablet; Take 4 tablets (40 mg total) by mouth daily with breakfast for 4 days, THEN 3 tablets (30 mg total) daily with breakfast for 3 days, THEN 2 tablets (20 mg total) daily with breakfast for 2 days, THEN 1 tablet (10 mg total) daily with breakfast for 1 day.  Dispense: 30 tablet; Refill: 0  2. HTN Improved  Family/ staff Communication: resident

## 2022-07-03 DIAGNOSIS — L111 Transient acantholytic dermatosis [Grover]: Secondary | ICD-10-CM | POA: Diagnosis not present

## 2022-07-03 DIAGNOSIS — R21 Rash and other nonspecific skin eruption: Secondary | ICD-10-CM | POA: Diagnosis not present

## 2022-07-03 DIAGNOSIS — L853 Xerosis cutis: Secondary | ICD-10-CM | POA: Diagnosis not present

## 2022-07-03 DIAGNOSIS — L570 Actinic keratosis: Secondary | ICD-10-CM | POA: Diagnosis not present

## 2022-07-08 ENCOUNTER — Encounter: Payer: Self-pay | Admitting: Adult Health

## 2022-07-08 ENCOUNTER — Non-Acute Institutional Stay: Payer: Medicare Other | Admitting: Adult Health

## 2022-07-08 ENCOUNTER — Ambulatory Visit: Payer: PRIVATE HEALTH INSURANCE | Admitting: Cardiovascular Disease

## 2022-07-08 VITALS — BP 158/76 | HR 65 | Temp 97.6°F | Resp 19 | Ht 65.0 in | Wt 173.4 lb

## 2022-07-08 DIAGNOSIS — R21 Rash and other nonspecific skin eruption: Secondary | ICD-10-CM | POA: Diagnosis not present

## 2022-07-08 DIAGNOSIS — R062 Wheezing: Secondary | ICD-10-CM | POA: Diagnosis not present

## 2022-07-08 DIAGNOSIS — R569 Unspecified convulsions: Secondary | ICD-10-CM | POA: Diagnosis not present

## 2022-07-08 DIAGNOSIS — R296 Repeated falls: Secondary | ICD-10-CM

## 2022-07-08 DIAGNOSIS — G40909 Epilepsy, unspecified, not intractable, without status epilepticus: Secondary | ICD-10-CM

## 2022-07-08 MED ORDER — EUCERIN EX LOTN: TOPICAL_LOTION | Freq: Two times a day (BID) | CUTANEOUS | 0 refills | Status: AC

## 2022-07-08 MED ORDER — HYDROXYZINE PAMOATE 25 MG PO CAPS: 25.0000 mg | ORAL_CAPSULE | Freq: Every day | ORAL | 0 refills | Status: DC

## 2022-07-08 MED ORDER — HYDROXYZINE HCL 10 MG PO TABS: 10.0000 mg | ORAL_TABLET | Freq: Every morning | ORAL | 0 refills | Status: DC

## 2022-07-08 NOTE — Progress Notes (Signed)
Location:  [facility] Room Number: [phone] Place of Service:  ALF [phone]) Provider:  Royal Hawthorn NP  [name], MD  Patient Care Team: [name], MD as PCP - General (Internal Medicine) Sanda Klein, MD as PCP - Cardiology (Cardiology) Rutherford Guys, MD as Consulting Physician (Ophthalmology) Kathrynn Ducking, MD (Inactive) as Consulting Physician (Neurology) Gayland Curry, DO (Geriatric Medicine)  Extended Emergency Contact Information Primary Emergency Contact: Bisson,[name] Address: [address]          [place] [address] Johnnette Litter of [place] Phone: [phone] Mobile Phone: [phone] Relation: Spouse Secondary Emergency Contact: Trauger,Keelan Mobile Phone: [phone] Relation: Son  Code Status:  DNR Goals of care: Advanced Directive information    07/08/2022    9:52 AM  Advanced Directives  Does Patient Have a Medical Advance Directive? Yes  Type of Advance Directive Living will;Out of facility DNR (pink MOST or yellow form)  Does patient want to make changes to medical advance directive? No - Patient declined  Pre-existing out of facility DNR order (yellow form or pink MOST form) Pink MOST form placed in chart (order not valid for inpatient use);Yellow form placed in chart (order not valid for inpatient use)     Chief Complaint  Patient presents with   Acute Visit    Patient has had a fall    Quality Metric Gaps    Discussed the need for [name] and Shingles vaccine    HPI:  Pt is a 87 y.o. 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[place])    Urinary urgency    Past Surgical History:  Procedure Laterality Date   CARDIAC DEFIBRILLATOR PLACEMENT     cataract surgery     COLONOSCOPY     CRANIOTOMY Right    right anterior temporal resection   defibrillator replaced  March 2016   IMPLANTATION VAGAL NERVE STIMULATOR  June 2016   NASAL SINUS SURGERY     TONSILLECTOMY     age 60   VASECTOMY  46    Allergies  Allergen Reactions   Depakote [Divalproex Sodium] Other (See Comments)    Arthralgias     Outpatient Encounter Medications as of 07/08/2022  Medication Sig   acetaminophen (TYLENOL) 325 MG tablet Take 650 mg by mouth every 6 (six) hours as needed for mild pain or moderate pain.   ALPRAZolam (XANAX) 0.25 MG tablet Take 1 tablet (0.25 mg total) by mouth 2 (two) times daily.   brimonidine-timolol (COMBIGAN) 0.2-0.5 % ophthalmic solution Place 1 drop into both eyes every 12 (twelve) hours.    calcium carbonate (TUMS SMOOTHIES) 750 MG chewable tablet Chew 2 tablets by mouth 4 (four) times daily as needed for heartburn.   camphor-menthol (SARNA) lotion Apply 1 Application topically as needed for itching.   cholecalciferol (VITAMIN D) 400 units TABS tablet Take 400 Units  by mouth 2 (two) times daily.   clindamycin (CLEOCIN T) 1 % lotion Apply 1 Application topically 2 (two) times daily.   clobetasol cream (TEMOVATE) 1.32 % Apply 1 application  topically 2 (two) times daily as needed (rash).   DILANTIN 100 MG ER capsule Take 300 mg by mouth. In the evening   econazole nitrate 1 % cream Apply 1 Application topically 2 (two) times daily.   finasteride (PROSCAR) 5 MG tablet Take 5 mg by mouth every morning.   flecainide (TAMBOCOR) 50 MG tablet Take 50 mg by mouth 2 (two) times daily.   fluocinonide ointment (LIDEX) 4.40 % Apply 1 Application topically 2 (two) times daily as needed (rash).   hydrALAZINE  (APRESOLINE) 10 MG tablet Take 1 tablet (10 mg total) by mouth 2 (two) times daily as needed.   lacosamide (VIMPAT) 200 MG TABS tablet Take 200 mg by mouth 2 (two) times daily.   latanoprost (XALATAN) 0.005 % ophthalmic solution Place 1 drop into both eyes at bedtime.   Levetiracetam 750 MG TB24 Take 2,250 mg by mouth daily.   LORazepam (ATIVAN) 2 MG/ML concentrated solution as needed for anxiety or seizure. MAR instructions:If seizure activity, given '2mg'$  IM.If continued seizure activity after 38mn give Ativan '2mg'$  IM again.   miconazole (MICOTIN) 2 % powder Apply 1 Application topically as needed for itching.   phenytoin (DILANTIN) 50 MG tablet Chew 75 mg by mouth daily.   polyethylene glycol (MIRALAX / GLYCOLAX) packet Take 17 g by mouth every other day. And as needed   sodium chloride 1 g tablet Take 1 tablet (1 g total) by mouth 2 (two) times daily with a meal.   tamsulosin (FLOMAX) 0.4 MG CAPS capsule Take 0.4 mg by mouth daily.   triamcinolone cream (KENALOG) 0.1 % Apply 1 Application topically 2 (two) times daily.   cetirizine (ZYRTEC) 10 MG tablet Take 1 tablet (10 mg total) by mouth at bedtime. (Patient not taking: Reported on 07/08/2022)   doxycycline (VIBRA-TABS) 100 MG tablet Take 1 tablet (100 mg total) by mouth 2 (two) times daily for 14 days. (Patient not taking: Reported on 07/08/2022)   No facility-administered encounter medications on file as of 07/08/2022.    Review of Systems  Immunization History  Administered Date(s) Administered   Fluad Quad(high Dose 65+) 03/22/2022   Influenza, High Dose Seasonal PF 04/07/2020   Influenza, Seasonal, Injecte, Preservative Fre 02/22/2010   Influenza,inj,Quad PF,6+ Mos 04/07/2018, 04/15/2019   Influenza-Unspecified 03/17/2013, 02/24/2014, 02/16/2015, 03/21/2016, 03/17/2017, 04/07/2018, 03/21/2021   Moderna SARS-COV2 Booster Vaccination 01/11/2021, 03/28/2021   Moderna Sars-Covid-2 Vaccination 06/28/2019, 07/27/2019, 05/01/2020,  04/22/2022   Pneumococcal Conjugate-13 07/05/2013   Pneumococcal Polysaccharide-23 06/17/2006   Td 06/18/2007   Tdap 12/07/2015, 09/17/2020   Zoster Recombinat (Shingrix) 12/04/2016   Zoster, Live 10/06/2008   Pertinent  Health Maintenance Due  Topic Date Due   INFLUENZA VACCINE  Completed      01/26/2022    9:30 AM 01/26/2022    7:59 PM 01/27/2022    8:00 AM 06/18/2022   10:38 AM 07/08/2022    9:52 AM  Fall Risk  Falls in the past year?    0 1  Was there an injury with Fall?    0 0  Fall Risk Category Calculator    0 1  Fall Risk Category (Retired)    Low   (RETIRED) Patient Fall Risk Level High fall risk High fall risk High fall risk High fall risk   Patient at Risk for Falls  Due to    History of fall(s) History of fall(s)  Fall risk Follow up    Falls evaluation completed Falls evaluation completed   Functional Status Survey:    Vitals:   07/08/22 0944  BP: (!) 162/82  Pulse: 62  Resp: 20  Temp: (!) 97.3 F (36.3 C)  TempSrc: Temporal  SpO2: 93%  Weight: 172 lb 12.8 oz (78.4 kg)  Height: '5\' 5"'$  (1.651 m)   Body mass index is 28.76 kg/m. Physical Exam  Labs reviewed: Recent Labs    01/25/22 0647 01/26/22 0547 01/27/22 0333 02/01/22 0000 02/07/22 0000 03/07/22 0000 06/24/22 0000  NA 123* 129* 131*   < > 136* 138 139  K 4.5 4.5 4.7   < > 4.4 4.4 5.1  CL 95* 99 104   < > 100 103 100  CO2 21* 23 17*   < > 22 25* 26*  GLUCOSE 95 100* 108*  --   --   --   --   BUN '9 10 17   '$ < > '14 15 16  '$ CREATININE 0.60* 0.64 0.76   < > 0.8 0.7 0.7  CALCIUM 8.5* 8.7* 7.6*   < > 8.8 8.9 9.4   < > = values in this interval not displayed.   No results for input(s): "AST", "ALT", "ALKPHOS", "BILITOT", "PROT", "ALBUMIN" in the last 8760 hours. Recent Labs    01/19/22 1139 01/19/22 1143 01/20/22 0250 01/21/22 0844 02/01/22 0000 06/24/22 0000  WBC 5.3  --  4.1 4.3 7.4 7.3  NEUTROABS 2.4  --   --   --   --  4.00  HGB 11.6*   < > 11.7* 11.6* 12.1* 12.4*  HCT 32.2*   < >  31.7* 31.5* 35* 37*  MCV 90.4  --  91.9 88.5  --   --   PLT 234  --  213 236 242 257   < > = values in this interval not displayed.   Lab Results  Component Value Date   TSH 2.685 01/19/2022   No results found for: "HGBA1C" Lab Results  Component Value Date   CHOL 214 (A) 06/24/2022   HDL 83 (A) 06/24/2022   LDLCALC 141 05/15/2021   TRIG 59 06/24/2022    Significant Diagnostic Results in last 30 days:  No results found.  Assessment/Plan There are no diagnoses linked to this encounter.

## 2022-07-08 NOTE — Progress Notes (Addendum)
Location:  [facility]  POS: Clinic  Provider: Royal Hawthorn, ANP  Code Status: DNR Goals of Care:     07/08/2022    3:04 PM  Advanced Directives  Does Patient Have a Medical Advance Directive? Yes  Type of Advance Directive Living will;Out of facility DNR (pink MOST or yellow form)  Does patient want to make changes to medical advance directive? No - Patient declined  Pre-existing out of facility DNR order (yellow form or pink MOST form) Pink MOST form placed in chart (order not valid for inpatient use);Yellow form placed in chart (order not valid for inpatient use)     Chief Complaint  Patient presents with   Medical Management of Chronic Issues    Patient here for itching and follow up    HPI: Patient is a 87 y.o. male seen today  for follow up regarding itching.   He has a hx of Grover's disease for years. He just completed a prednisone taper with modest relief. He is using sarna lotion as well Prior to taking prednisone the tried clobetasol ointment and doxycycline with no relief.  I have checked with the pharmacy and he is not on any new med formulations. He is trying mild detergent.  He reports he wore flannel pajamas and it made his back itching worse. His legs are not itching. Arms are somewhat bothersome. Chest is itching. The rash is maculopapular. He saw dermatology 1/17 but at that time had found some relief with prednisone but the rash is back, worse, and it leading to excessive scratching, excoriation, etc.   Also the nurse wrote an SBAR because he fell two times in 48 hrs. He tripped on his walker and sustained two skin tears.   Another time he was reaching for something that was not there and fell and hit his head on the walker. He had a seizure on 1/19 per the nurse and received ativan and had an unsteady gait afterward. The nursing notes do not not report any jerking or shaking.  He endorses period delusions for years due to his seizure meds.   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[place])    Urinary urgency     Past Surgical History:  Procedure Laterality Date   CARDIAC DEFIBRILLATOR PLACEMENT     cataract surgery     COLONOSCOPY     CRANIOTOMY Right    right anterior temporal resection   defibrillator replaced  March 2016   IMPLANTATION VAGAL NERVE STIMULATOR  June 2016   NASAL SINUS SURGERY     TONSILLECTOMY     age 36   VASECTOMY  33    Allergies  Allergen Reactions   Depakote [Divalproex Sodium] Other (See Comments)    Arthralgias     Outpatient Encounter Medications as of 07/08/2022  Medication Sig   acetaminophen (TYLENOL) 325 MG tablet Take 650 mg  by mouth every 6 (six) hours as needed for mild pain or moderate pain.   ALPRAZolam (XANAX) 0.25 MG tablet Take 1 tablet (0.25 mg total) by mouth 2 (two) times daily.   brimonidine-timolol (COMBIGAN) 0.2-0.5 % ophthalmic solution Place 1 drop into both eyes every 12 (twelve) hours.    calcium carbonate (TUMS SMOOTHIES) 750 MG chewable tablet Chew 2 tablets by mouth 4 (four) times daily as needed for heartburn.   camphor-menthol (SARNA) lotion Apply 1 Application topically as needed for itching.   cholecalciferol (VITAMIN D) 400 units TABS tablet Take 400 Units by mouth 2 (two) times daily.   clindamycin (CLEOCIN T) 1 % lotion Apply 1 Application topically 2 (two) times daily.   clobetasol cream (TEMOVATE) 3.00 % Apply 1 application  topically 2 (two) times daily as needed  (rash).   DILANTIN 100 MG ER capsule Take 300 mg by mouth. In the evening   econazole nitrate 1 % cream Apply 1 Application topically 2 (two) times daily.   finasteride (PROSCAR) 5 MG tablet Take 5 mg by mouth every morning.   flecainide (TAMBOCOR) 50 MG tablet Take 50 mg by mouth 2 (two) times daily.   fluocinonide ointment (LIDEX) 9.23 % Apply 1 Application topically 2 (two) times daily as needed (rash).   hydrALAZINE (APRESOLINE) 10 MG tablet Take 1 tablet (10 mg total) by mouth 2 (two) times daily as needed.   lacosamide (VIMPAT) 200 MG TABS tablet Take 200 mg by mouth 2 (two) times daily.   latanoprost (XALATAN) 0.005 % ophthalmic solution Place 1 drop into both eyes at bedtime.   Levetiracetam 750 MG TB24 Take 2,250 mg by mouth daily.   LORazepam (ATIVAN) 2 MG/ML concentrated solution as needed for anxiety or seizure. MAR instructions:If seizure activity, given '2mg'$  IM.If continued seizure activity after 77mn give Ativan '2mg'$  IM again.   miconazole (MICOTIN) 2 % powder Apply 1 Application topically as needed for itching.   phenytoin (DILANTIN) 50 MG tablet Chew 75 mg by mouth daily.   polyethylene glycol (MIRALAX / GLYCOLAX) packet Take 17 g by mouth every other day. And as needed   sodium chloride 1 g tablet Take 1 tablet (1 g total) by mouth 2 (two) times daily with a meal.   tamsulosin (FLOMAX) 0.4 MG CAPS capsule Take 0.4 mg by mouth daily.   triamcinolone cream (KENALOG) 0.1 % Apply 1 Application topically 2 (two) times daily.   cetirizine (ZYRTEC) 10 MG tablet Take 1 tablet (10 mg total) by mouth at bedtime. (Patient not taking: Reported on 07/08/2022)   doxycycline (VIBRA-TABS) 100 MG tablet Take 1 tablet (100 mg total) by mouth 2 (two) times daily for 14 days. (Patient not taking: Reported on 07/08/2022)   No facility-administered encounter medications on file as of 07/08/2022.    Review of Systems:  Review of Systems  Constitutional:  Negative for activity change, appetite change,  chills, diaphoresis, fatigue, fever and unexpected weight change.  Respiratory:  Negative for cough, shortness of breath, wheezing and stridor.   Cardiovascular:  Negative for chest pain, palpitations and leg swelling.  Gastrointestinal:  Negative for abdominal distention, abdominal pain, constipation and diarrhea.  Genitourinary:  Negative for difficulty urinating and dysuria.  Musculoskeletal:  Positive for gait problem. Negative for arthralgias, back pain, joint swelling and myalgias.  Skin:  Positive for rash.  Neurological:  Positive for seizures. Negative for dizziness, syncope, facial asymmetry, speech difficulty, weakness and headaches.  Hematological:  Negative for adenopathy. Does not bruise/bleed easily.  Psychiatric/Behavioral:  Positive for confusion. Negative for agitation and behavioral problems.     Health Maintenance  Topic Date Due   Zoster Vaccines- Shingrix (2 of 2) 01/29/2017   Medicare Annual Wellness ([name])  06/14/2020   COVID-19 Vaccine (5 - 2023-24 season) 07/24/2022 (Originally 06/17/2022)   DTaP/Tdap/Td (4 - Td or Tdap) 09/18/2030   Pneumonia Vaccine 59+ Years old  Completed   INFLUENZA VACCINE  Completed   HPV VACCINES  Aged Out    Physical Exam: Vitals:   07/08/22 1504  BP: (!) 158/76  Pulse: 65  Resp: 19  Temp: 97.6 F (36.4 C)  TempSrc: Temporal  SpO2: 97%  Weight: 173 lb 6.4 oz (78.7 kg)  Height: '5\' 5"'$  (1.651 m)   Body mass index is 28.86 kg/m. Physical Exam Vitals and nursing note reviewed.  Constitutional:      General: He is not in acute distress.    Appearance: He is not diaphoretic.  HENT:     Head: Normocephalic.     Mouth/Throat:     Mouth: Mucous membranes are moist.     Pharynx: Oropharynx is clear.  Neck:     Thyroid: No thyromegaly.     Vascular: No JVD.     Trachea: No tracheal deviation.  Cardiovascular:     Rate and Rhythm: Normal rate. Rhythm irregular.     Heart sounds: No murmur heard. Pulmonary:     Effort:  Pulmonary effort is normal. No respiratory distress.     Breath sounds: Normal breath sounds. No wheezing.  Abdominal:     General: Bowel sounds are normal. There is no distension.     Palpations: Abdomen is soft.     Tenderness: There is no abdominal tenderness.  Lymphadenopathy:     Cervical: No cervical adenopathy.  Skin:    General: Skin is warm and dry.     Comments: Skin tear to right forearm x 2 covered with polymem Abrasion to forehead Maculopapular rash to chest, back, and arms Also noted to legs with reticulated appearance.    Neurological:     General: No focal deficit present.     Mental Status: He is alert. Mental status is at baseline.     Cranial Nerves: No cranial nerve deficit.     Labs reviewed: Basic Metabolic Panel: Recent Labs    01/19/22 1658 01/19/22 2206 01/25/22 0647 01/26/22 0547 01/27/22 0333 02/01/22 0000 02/07/22 0000 03/07/22 0000 06/24/22 0000  NA 123*   < > 123* 129* 131*   < > 136* 138 139  K 3.8   < > 4.5 4.5 4.7   < > 4.4 4.4 5.1  CL 91*   < > 95* 99 104   < > 100 103 100  CO2 21*   < > 21* 23 17*   < > 22 25* 26*  GLUCOSE 131*   < > 95 100* 108*  --   --   --   --   BUN 7*   < > '9 10 17   '$ < > '14 15 16  '$ CREATININE 0.62   < > 0.60* 0.64 0.76   < > 0.8 0.7 0.7  CALCIUM 8.5*   < > 8.5* 8.7* 7.6*   < > 8.8 8.9 9.4  TSH 2.685  --   --   --   --   --   --   --   --    < > = values in this interval not displayed.  Liver Function Tests: No results for input(s): "AST", "ALT", "ALKPHOS", "BILITOT", "PROT", "ALBUMIN" in the last 8760 hours. No results for input(s): "LIPASE", "AMYLASE" in the last 8760 hours. No results for input(s): "AMMONIA" in the last 8760 hours. CBC: Recent Labs    01/19/22 1139 01/19/22 1143 01/20/22 0250 01/21/22 0844 02/01/22 0000 06/24/22 0000  WBC 5.3  --  4.1 4.3 7.4 7.3  NEUTROABS 2.4  --   --   --   --  4.00  HGB 11.6*   < > 11.7* 11.6* 12.1* 12.4*  HCT 32.2*   < > 31.7* 31.5* 35* 37*  MCV 90.4  --   91.9 88.5  --   --   PLT 234  --  213 236 242 257   < > = values in this interval not displayed.   Lipid Panel: Recent Labs    06/24/22 0000  CHOL 214*  HDL 83*  TRIG 59   No results found for: "HGBA1C"  Procedures since last visit: No results found.  Assessment/Plan  1. Rash Discussed with D Faucette PA with derm The rash to his chest appears to be Grover's, the back and arms may be a contact dermatitis or other uncertain etiology Will try triamcinolone mixed with Eucerin 1:1 to arms, legs, chest, back x 2 weeks. His skin is very dry and ensuring proper application is paramount.  Add atarax for itching.  F/U with derm if not improved.  Consider adding back prednisone if dilantin levels are acceptable when labs return if not improving.   2. Seizure disorder ([place]) Its unclear from the notes of this was a tonic clonic seizure.I discussed with the supervisor that we need to be certain this is the case before giving the ativan which affects his gait.   3. Falls frequently Long term issue, exacerbated by ativan Poor safety awareness Encouraged to use walker Ordered labs to ensure normal electrolytes.    Labs/tests ordered:  * No order type specified * BMP Dilantin. level     Total time 57mn:  time greater than 50% of total time spent doing pt counseling and coordination of care

## 2022-07-09 NOTE — Progress Notes (Signed)
This encounter was created in error - please disregard.

## 2022-07-12 ENCOUNTER — Other Ambulatory Visit: Payer: Self-pay | Admitting: Adult Health

## 2022-07-12 MED ORDER — DIAZEPAM 10 MG RE GEL: 10.0000 mg | Freq: Every day | RECTAL | 2 refills | Status: DC | PRN

## 2022-07-12 NOTE — Progress Notes (Signed)
[facility] reports that there is a Producer, television/film/video of IM ativan and Mr Brutus needs a substitute for his prn seizure med. PR valium ordered

## 2022-08-06 ENCOUNTER — Ambulatory Visit: Payer: PRIVATE HEALTH INSURANCE | Admitting: Nurse Practitioner

## 2022-08-17 DIAGNOSIS — M545 Low back pain, unspecified: Secondary | ICD-10-CM | POA: Diagnosis not present

## 2022-08-17 DIAGNOSIS — M25559 Pain in unspecified hip: Secondary | ICD-10-CM | POA: Diagnosis not present

## 2022-08-24 ENCOUNTER — Emergency Department (HOSPITAL_BASED_OUTPATIENT_CLINIC_OR_DEPARTMENT_OTHER)
Admission: EM | Admit: 2022-08-24 | Discharge: 2022-08-24 | Disposition: A | Discharge status: Home / Self Care | Payer: Medicare Other | Attending: Emergency Medicine | Admitting: Emergency Medicine

## 2022-08-24 ENCOUNTER — Emergency Department (HOSPITAL_BASED_OUTPATIENT_CLINIC_OR_DEPARTMENT_OTHER): Payer: Medicare Other

## 2022-08-24 ENCOUNTER — Other Ambulatory Visit: Payer: Self-pay

## 2022-08-24 ENCOUNTER — Encounter (HOSPITAL_BASED_OUTPATIENT_CLINIC_OR_DEPARTMENT_OTHER): Payer: Self-pay

## 2022-08-24 DIAGNOSIS — Z9889 Other specified postprocedural states: Secondary | ICD-10-CM | POA: Diagnosis not present

## 2022-08-24 DIAGNOSIS — W01198A Fall on same level from slipping, tripping and stumbling with subsequent striking against other object, initial encounter: Secondary | ICD-10-CM | POA: Insufficient documentation

## 2022-08-24 DIAGNOSIS — S0990XA Unspecified injury of head, initial encounter: Secondary | ICD-10-CM | POA: Insufficient documentation

## 2022-08-24 DIAGNOSIS — R5383 Other fatigue: Secondary | ICD-10-CM | POA: Diagnosis not present

## 2022-08-24 DIAGNOSIS — Y9301 Activity, walking, marching and hiking: Secondary | ICD-10-CM | POA: Insufficient documentation

## 2022-08-24 DIAGNOSIS — E871 Hypo-osmolality and hyponatremia: Secondary | ICD-10-CM | POA: Diagnosis not present

## 2022-08-24 LAB — CBC WITH DIFFERENTIAL/PLATELET
Abs Immature Granulocytes: 0.02 10*3/uL (ref 0.00–0.07)
Basophils Absolute: 0.1 10*3/uL (ref 0.0–0.1)
Basophils Relative: 2 %
Eosinophils Absolute: 0.5 10*3/uL (ref 0.0–0.5)
Eosinophils Relative: 11 %
HCT: 32.3 % — ABNORMAL LOW (ref 39.0–52.0)
Hemoglobin: 12 g/dL — ABNORMAL LOW (ref 13.0–17.0)
Immature Granulocytes: 0 %
Lymphocytes Relative: 30 %
Lymphs Abs: 1.4 10*3/uL (ref 0.7–4.0)
MCH: 33 pg (ref 26.0–34.0)
MCHC: 37.2 g/dL — ABNORMAL HIGH (ref 30.0–36.0)
MCV: 88.7 fL (ref 80.0–100.0)
Monocytes Absolute: 0.7 10*3/uL (ref 0.1–1.0)
Monocytes Relative: 15 %
Neutro Abs: 1.9 10*3/uL (ref 1.7–7.7)
Neutrophils Relative %: 42 %
Platelets: 219 10*3/uL (ref 150–400)
RBC: 3.64 MIL/uL — ABNORMAL LOW (ref 4.22–5.81)
RDW: 12.3 % (ref 11.5–15.5)
WBC: 4.6 10*3/uL (ref 4.0–10.5)
nRBC: 0 % (ref 0.0–0.2)

## 2022-08-24 LAB — RAPID URINE DRUG SCREEN, HOSP PERFORMED
Amphetamines: NOT DETECTED
Barbiturates: NOT DETECTED
Benzodiazepines: POSITIVE — AB
Cocaine: NOT DETECTED
Opiates: NOT DETECTED
Tetrahydrocannabinol: NOT DETECTED

## 2022-08-24 LAB — URINALYSIS, W/ REFLEX TO CULTURE (INFECTION SUSPECTED)
Bacteria, UA: NONE SEEN
Bilirubin Urine: NEGATIVE
Glucose, UA: NEGATIVE mg/dL
Hgb urine dipstick: NEGATIVE
Ketones, ur: NEGATIVE mg/dL
Leukocytes,Ua: NEGATIVE
Nitrite: NEGATIVE
Protein, ur: NEGATIVE mg/dL
Specific Gravity, Urine: 1.018 (ref 1.005–1.030)
pH: 6.5 (ref 5.0–8.0)

## 2022-08-24 LAB — COMPREHENSIVE METABOLIC PANEL
ALT: 26 U/L (ref 0–44)
AST: 27 U/L (ref 15–41)
Albumin: 4.3 g/dL (ref 3.5–5.0)
Alkaline Phosphatase: 116 U/L (ref 38–126)
Anion gap: 8 (ref 5–15)
BUN: 14 mg/dL (ref 8–23)
CO2: 25 mmol/L (ref 22–32)
Calcium: 9.1 mg/dL (ref 8.9–10.3)
Chloride: 92 mmol/L — ABNORMAL LOW (ref 98–111)
Creatinine, Ser: 0.73 mg/dL (ref 0.61–1.24)
GFR, Estimated: >60 mL/min (ref >60)
Glucose, Bld: 102 mg/dL — ABNORMAL HIGH (ref 70–99)
Potassium: 4.3 mmol/L (ref 3.5–5.1)
Sodium: 125 mmol/L — ABNORMAL LOW (ref 135–145)
Total Bilirubin: 0.5 mg/dL (ref 0.3–1.2)
Total Protein: 7.3 g/dL (ref 6.5–8.1)

## 2022-08-24 LAB — LACTIC ACID, PLASMA: Lactic Acid, Venous: 0.6 mmol/L (ref 0.5–1.9)

## 2022-08-24 LAB — MAGNESIUM: Magnesium: 1.7 mg/dL (ref 1.7–2.4)

## 2022-08-24 LAB — CBG MONITORING, ED: Glucose-Capillary: 105 mg/dL — ABNORMAL HIGH (ref 70–99)

## 2022-08-24 LAB — ETHANOL: Alcohol, Ethyl (B): <10 mg/dL (ref <10)

## 2022-08-24 MED ORDER — SODIUM CHLORIDE 0.9 % IV BOLUS
1000.0000 mL | Freq: Once | INTRAVENOUS | Status: AC
  Administered 2022-08-24: 1000 mL via INTRAVENOUS

## 2022-08-24 NOTE — ED Provider Notes (Signed)
[facility] Provider Note   CSN: [id number] Arrival date & time: 08/24/22  1416     History Chief Complaint  Patient presents with   Fall    HPI Cameron Potts is a 87 y.o. male presenting for chief complaint of fall.  He is an 87 year old male with an extensive medical history.  He states that he was at his nursing facility 3 days ago when he was leaning back while walking to reach for something and lost his balance falling over backwards bring his walker with him.  He hit his head on a rocking chair.  He refused emergency department transport at that time.  X-ray was performed in the outpatient setting with no acute pathology detected.  Patient denies fevers chills nausea vomiting syncope shortness of breath.  Wife was checking on him at the facility today and noted that he appeared confused with increased response times. States that she he is normally very sharp but that over the last 2 days he has been intermittently confused.  Patient's recorded medical, surgical, social, medication list and allergies were reviewed in the Snapshot window as part of the initial history.   Review of Systems   Review of Systems  Constitutional:  Negative for chills and fever.  HENT:  Negative for ear pain and sore throat.   Eyes:  Negative for pain and visual disturbance.  Respiratory:  Negative for cough and shortness of breath.   Cardiovascular:  Negative for chest pain and palpitations.  Gastrointestinal:  Negative for abdominal pain and vomiting.  Genitourinary:  Negative for dysuria and hematuria.  Musculoskeletal:  Negative for arthralgias and back pain.  Skin:  Negative for color change and rash.  Neurological:  Negative for seizures, syncope and headaches.  All other systems reviewed and are negative.   Physical Exam Updated Vital Signs BP 122/62 (BP Location: Right Arm)   Pulse 73   Temp 97.9 F (36.6 C) (Oral)   Resp 18   Ht 5'  5" (1.651 m)   Wt 78.7 kg   SpO2 99%   BMI 28.87 kg/m  Physical Exam Vitals and nursing note reviewed.  Constitutional:      General: He is not in acute distress.    Appearance: He is well-developed.  HENT:     Head: Normocephalic and atraumatic.  Eyes:     Conjunctiva/sclera: Conjunctivae normal.  Cardiovascular:     Rate and Rhythm: Normal rate and regular rhythm.     Heart sounds: No murmur heard. Pulmonary:     Effort: Pulmonary effort is normal. No respiratory distress.     Breath sounds: Normal breath sounds.  Abdominal:     Palpations: Abdomen is soft.     Tenderness: There is no abdominal tenderness.  Musculoskeletal:        General: No swelling.     Cervical back: Neck supple.  Skin:    General: Skin is warm and dry.     Capillary Refill: Capillary refill takes less than 2 seconds.  Neurological:     Mental Status: He is alert.  Psychiatric:        Mood and Affect: Mood normal.      ED Course/ Medical Decision Making/ A&P    Procedures Procedures   Medications Ordered in ED Medications  sodium chloride 0.9 % bolus 1,000 mL (0 mLs Intravenous Stopped 08/24/22 1812)   Medical Decision Making:   Cameron Potts is a 87 y.o. male  who presented to the ED today with a fall at their living facility detailed above. They are not on a blood thinner. Patient's presentation is complicated by their history of multiple comorbid medical problems.  Patient placed on continuous vitals and telemetry monitoring while in ED which was reviewed periodically.   Complete initial physical exam performed, notably the patient  was hemodynamically stable  in no acute distress. No obvious deformities or injuries appreciated on extensive physical exam including active range of motion of all joints.     Reviewed and confirmed nursing documentation for past medical history, family history, social history.    Initial Assessment/Plan:   This is a patient presenting with a  moderate blunt mechanism trauma.  As such, I have considered intracranial injuries including intracranial hemorrhage, intrathoracic injuries including blunt myocardial or blunt lung injury, blunt abdominal injuries including aortic dissection, bladder injury, spleen injury, liver injury and I have considered orthopedic injuries including extremity or spinal injury. This is most consistent with an acute life/limb threatening illness complicated by underlying chronic conditions.  With the patient's presentation of moderate mechanism trauma but an otherwise reassuring exam, patient warrants targeted evaluation for potential traumatic injuries. Will proceed with targeted evaluation for potential injuries. Will proceed with CT Head, Images reviewed and agree with radiology interpretation.  CT HEAD WO CONTRAST (5MM)  Result Date: 08/24/2022 CLINICAL DATA:  Head trauma.  Fall 2-3 days ago with head strike. EXAM: CT HEAD WITHOUT CONTRAST TECHNIQUE: Contiguous axial images were obtained from the base of the skull through the vertex without intravenous contrast. RADIATION DOSE REDUCTION: This exam was performed according to the departmental dose-optimization program which includes automated exposure control, adjustment of the mA and/or kV according to patient size and/or use of iterative reconstruction technique. COMPARISON:  Head CT 01/14/2022. FINDINGS: Brain: Unchanged postoperative appearance from prior right anterior temporal lobectomy. Unchanged encephalomalacia along the right anterior cingulate gyrus. Gray-white differentiation is otherwise preserved no acute intracranial hemorrhage. No hydrocephalus or extra-axial collection. Vascular: No hyperdense vessel or unexpected calcification. Skull: Prior right frontotemporal craniotomy. Sinuses/Orbits: Mild mucosal disease throughout the paranasal sinuses. Trace opacification of the left mastoid. Orbits are unremarkable. Other: None. IMPRESSION: 1. No acute  intracranial abnormality. 2. Unchanged postoperative appearance from prior right anterior temporal lobectomy. Unchanged encephalomalacia along the right anterior cingulate gyrus. Electronically Signed   By: Emmit Alexanders M.D.   On: 08/24/2022 15:48    Final Reassessment and Plan:   Screening laboratory evaluation grossly reassuring.  CT head with no focal pathology. I was called back to bedside as patient is requesting discharge.  He would feel more comfortable going home.  He was hyponatremic slightly.  Treated with 1 L IV normal saline but patient is requesting discharge prior to repeat lab check.  Given well appearance, stability, history of similar, patient is stable for outpatient care management.  Patient discharged with strict return precautions to the care of wife.  Disposition:  I have considered need for hospitalization, however, considering all of the above, I believe this patient is stable for discharge at this time.  Patient/family educated about specific return precautions for given chief complaint and symptoms.  Patient/family educated about follow-up with PCP.     Patient/family expressed understanding of return precautions and need for follow-up. Patient spoken to regarding all imaging and laboratory results and appropriate follow up for these results. All education provided in verbal form with additional information in written form. Time was allowed for answering of patient questions. Patient discharged.  Emergency Department Medication Summary:   Medications  sodium chloride 0.9 % bolus 1,000 mL (0 mLs Intravenous Stopped 08/24/22 1812)           Clinical Impression:  1. Other fatigue      Discharge   Final Clinical Impression(s) / ED Diagnoses Final diagnoses:  Other fatigue    Rx / DC Orders ED Discharge Orders     None         Tretha Sciara, MD 08/24/22 2128

## 2022-08-24 NOTE — ED Notes (Signed)
Dc instructions reviewed with patient. Patient voiced understanding. Dc with belongings.  °

## 2022-08-24 NOTE — ED Triage Notes (Signed)
Patient here POV from [facility].  Endorses Fall sustained 2-3 Days ago in which he stumbled and fell backwards hitting his Posterior Head on a Chair.  No Pain currently. Visitor endorses Patient has been more confused and lethargic than normal since fall.   NAD noted during Triage. Currently A&Ox4. GCS 15. BIB Wheelchair.

## 2022-08-26 ENCOUNTER — Encounter: Payer: Self-pay | Admitting: Internal Medicine

## 2022-08-26 ENCOUNTER — Non-Acute Institutional Stay (SKILLED_NURSING_FACILITY): Payer: Medicare Other | Admitting: Internal Medicine

## 2022-08-26 DIAGNOSIS — R296 Repeated falls: Secondary | ICD-10-CM | POA: Diagnosis not present

## 2022-08-26 DIAGNOSIS — I10 Essential (primary) hypertension: Secondary | ICD-10-CM | POA: Diagnosis not present

## 2022-08-26 DIAGNOSIS — E871 Hypo-osmolality and hyponatremia: Secondary | ICD-10-CM | POA: Diagnosis not present

## 2022-08-26 DIAGNOSIS — G40919 Epilepsy, unspecified, intractable, without status epilepticus: Secondary | ICD-10-CM | POA: Diagnosis not present

## 2022-08-26 DIAGNOSIS — L111 Transient acantholytic dermatosis [Grover]: Secondary | ICD-10-CM | POA: Diagnosis not present

## 2022-08-26 DIAGNOSIS — G40909 Epilepsy, unspecified, not intractable, without status epilepticus: Secondary | ICD-10-CM | POA: Diagnosis not present

## 2022-08-26 LAB — BASIC METABOLIC PANEL
BUN: 11 (ref 4–21)
CO2: 17 (ref 13–22)
Chloride: 95 — AB (ref 99–108)
Creatinine: 0.6 (ref 0.6–1.3)
Glucose: 93
Potassium: 4.7 mEq/L (ref 3.5–5.1)
Sodium: 126 — AB (ref 137–147)

## 2022-08-26 LAB — COMPREHENSIVE METABOLIC PANEL: Calcium: 8.8 (ref 8.7–10.7)

## 2022-08-26 NOTE — Progress Notes (Unsigned)
Provider:  Sherre Lain Location:   [facility] Room Number: [id number] Place of Service:  SNF (31)  PCP: [name], MD Patient Care Team: [name], MD as PCP - General (Internal Medicine) Sanda Klein, MD as PCP - Cardiology (Cardiology) Rutherford Guys, MD as Consulting Physician (Ophthalmology) Kathrynn Ducking, MD (Inactive) as Consulting Physician (Neurology) Gayland Curry, DO (Geriatric Medicine)  Extended Emergency Contact Information Primary Emergency Contact: Arlington,[name] Address: [address]          [place] [address] Johnnette Litter of [place] Phone: [phone] Mobile Phone: [phone] Relation: Spouse Secondary Emergency Contact: Montone,Yasuo Mobile Phone: [phone] Relation: Son  Code Status: DNR Goals of Care: Advanced Directive information    08/26/2022    4:00 PM  Advanced Directives  Does Patient Have a Medical Advance Directive? Yes  Type of Advance Directive Living will;Out of facility DNR (pink MOST or yellow form)  Does patient want to make changes to medical advance directive? No - Patient declined  Copy of [place] in Chart? Yes - validated most recent copy scanned in chart (See row information)      Chief Complaint  Patient presents with  . New Admit To SNF    New Admission.    HPI: Patient is a 87 y.o. male seen today for admission to Rehab for weakness and Hyponatremia  Patient usually lives in [place] in [place]  He fell few days ago and was on Neuro check when staff and his wife noticed that he was more confused and weak He went to ED CT done showed no new Abnormality But his Sodium was 126  He  refused admission And was admitted in [place] where he was given extra sodium This morning he says he feels better and wants to go back to his room He walked out of rehab and tod the staff he is going back to his room  Later AL nurse called me and told me he is  confused and walking around in AL and his wife wants him to go back to Rehab His repeat Sodium is 126 He does have diagnosis of SIADH from last admission for hyonatremia   Patient also has a history of refractive seizure disorder for 30 years Is on multiple medications follow closely with neurology had seen a number of specialist has had surgery done and now also has a vagal stimulator.  Also gets as needed lV valium .  His seizure are complex including hallucinations.  He states that he knows they are coming.    History of chronic A. fib s/p ICD defibrillator Not on Coumadin anymore due to his multiple falls and Seizures  Past Medical History:  Diagnosis Date  . Abnormality of gait 12/21/2013  . Atrial fibrillation ([place])   . Blind left eye   . Carcinoma in situ of prostate   . Cardiac arrest ([place])   . Glaucoma   . Hypercholesteremia   . Hyperlipidemia   . Metabolic encephalopathy   . Mild left ventricular systolic dysfunction   . Presence of other specified functional implants   . PSA elevation   . Right frontal lobe lesion   . Seizures ([place])   . Tinea pedis   . 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 Urinary urgency    Past Surgical History:  Procedure Laterality Date  . CARDIAC DEFIBRILLATOR PLACEMENT    . cataract surgery    . COLONOSCOPY    .  CRANIOTOMY Right    right anterior temporal resection  . defibrillator replaced  March 2016  . IMPLANTATION VAGAL NERVE STIMULATOR  June 2016  . NASAL SINUS SURGERY    . TONSILLECTOMY     age 28  . VASECTOMY  1985    reports that he has never smoked. He has never used smokeless tobacco. He reports that he does not currently use alcohol. He reports that he does not use drugs. Social History   Socioeconomic History  . Marital status: Married    Spouse name: Opal Sidles  . Number of children: 3  . Years of education: MBA  . Highest education level: Not on file  Occupational History  . Occupation: Retired  . Occupation:  Charity fundraiser  Tobacco Use  . Smoking status: Never  . Smokeless tobacco: Never  . Tobacco comments:    quit 1960s  Vaping Use  . Vaping Use: Never used  Substance and Sexual Activity  . Alcohol use: Not Currently    Comment: former beer drinker  . Drug use: No  . Sexual activity: Not Currently  Other Topics Concern  . Not on file  Social History Narrative   Lives Assisted Living, [facility]   Right Handed   Drinks 2-3 cups caffeine daily   Social Determinants of Health   Financial Resource Strain: Low Risk  (04/15/2018)   Overall Financial Resource Strain (CARDIA)   . Difficulty of Paying Living Expenses: Not hard at all  Food Insecurity: No Food Insecurity (04/15/2018)   Hunger Vital Sign   . Worried About Charity fundraiser in the Last Year: Never true   . Ran Out of Food in the Last Year: Never true  Transportation Needs: No Transportation Needs (04/15/2018)   PRAPARE - Transportation   . Lack of Transportation (Medical): No   . Lack of Transportation (Non-Medical): No  Physical Activity: Sufficiently Active (04/15/2018)   Exercise Vital Sign   . Days of Exercise per Week: 5 days   . Minutes of Exercise per Session: 30 min  Stress: No Stress Concern Present (04/15/2018)   [place]   . Feeling of Stress : Not at all  Social Connections: Somewhat Isolated (04/15/2018)   Social Connection and Isolation Panel [NHANES]   . Frequency of Communication with Friends and Family: Three times a week   . Frequency of Social Gatherings with Friends and Family: Three times a week   . Attends Religious Services: Never   . Active Member of Clubs or Organizations: No   . Attends Archivist Meetings: Never   . Marital Status: Married  Human resources officer Violence: Not At Risk (04/15/2018)   Humiliation, Afraid, Rape, and Kick questionnaire   . Fear of Current or Ex-Partner: No   . Emotionally Abused:  No   . Physically Abused: No   . Sexually Abused: No    Functional Status Survey:    Family History  Problem Relation Age of Onset  . Cancer - Lung Mother   . Cancer - Prostate Father     Health Maintenance  Topic Date Due  . Zoster Vaccines- Shingrix (2 of 2) 01/29/2017  . Medicare Annual Wellness ([name])  06/14/2020  . COVID-19 Vaccine (5 - 2023-24 season) 06/17/2022  . DTaP/Tdap/Td (4 - Td or Tdap) 09/18/2030  . Pneumonia Vaccine 85+ Years old  Completed  . INFLUENZA VACCINE  Completed  . HPV VACCINES  Aged Out    Allergies  Allergen Reactions  . Depakote [Divalproex Sodium] Other (See Comments)    Arthralgias     Allergies as of 08/26/2022       Reactions   Depakote [divalproex Sodium] Other (See Comments)   Arthralgias        Medication List        Accurate as of August 26, 2022  4:02 PM. If you have any questions, ask your nurse or doctor.          STOP taking these medications    clindamycin 1 % lotion Commonly known as: CLEOCIN T Stopped by: [name], MD   diazepam 10 MG Gel Commonly known as: DIASTAT ACUDIAL Stopped by: [name], MD   fluocinonide ointment 0.05 % Commonly known as: LIDEX Stopped by: [name], MD   hydrOXYzine 10 MG tablet Commonly known as: ATARAX Stopped by: [name], MD   hydrOXYzine 25 MG capsule Commonly known as: VISTARIL Stopped by: [name], MD       TAKE these medications    acetaminophen 325 MG tablet Commonly known as: TYLENOL Take 650 mg by mouth every 6 (six) hours as needed for mild pain or moderate pain.   ALPRAZolam 0.25 MG tablet Commonly known as: XANAX Take 0.25 mg by mouth 2 (two) times daily as needed for anxiety. What changed: Another medication with the same name was removed. Continue taking this medication, and follow the directions you see here. Changed by: [name], MD   brimonidine-timolol 0.2-0.5 % ophthalmic solution Commonly known as:  COMBIGAN Place 1 drop into both eyes every 12 (twelve) hours.   camphor-menthol lotion Commonly known as: SARNA Apply 1 Application topically every 8 (eight) hours as needed for itching.   cetirizine 10 MG tablet Commonly known as: ZYRTEC Take 10 mg by mouth as needed for allergies.   cholecalciferol 10 MCG (400 UNIT) Tabs tablet Commonly known as: VITAMIN D3 Take 400 Units by mouth 2 (two) times daily.   clobetasol cream 0.05 % Commonly known as: TEMOVATE Apply 1 application  topically 2 (two) times daily as needed (rash).   Dilantin 100 MG ER capsule Generic drug: phenytoin Take 300 mg by mouth. In the evening   econazole nitrate 1 % cream Apply 1 Application topically 2 (two) times daily as needed.   finasteride 5 MG tablet Commonly known as: PROSCAR Take 5 mg by mouth every morning.   flecainide 50 MG tablet Commonly known as: TAMBOCOR Take 50 mg by mouth 2 (two) times daily.   hydrALAZINE 10 MG tablet Commonly known as: APRESOLINE Take 1 tablet (10 mg total) by mouth 2 (two) times daily as needed.   lacosamide 200 MG Tabs tablet Commonly known as: VIMPAT Take 200 mg by mouth 2 (two) times daily.   latanoprost 0.005 % ophthalmic solution Commonly known as: XALATAN Place 1 drop into both eyes at bedtime.   levETIRAcetam 750 MG 24 hr tablet Commonly known as: KEPPRA XR Take 2,250 mg by mouth daily.   LORazepam 2 MG/ML concentrated solution Commonly known as: ATIVAN Take 0.5 mg by mouth as needed for anxiety.   miconazole 2 % powder Commonly known as: MICOTIN Apply 1 Application topically 2 (two) times daily.   phenytoin 50 MG tablet Commonly known as: DILANTIN Chew 75 mg by mouth daily.   polyethylene glycol 17 g packet Commonly known as: MIRALAX / GLYCOLAX Take 17 g by mouth every other day. And as needed   polyethylene glycol 17  g packet Commonly known as: MIRALAX / GLYCOLAX Take 17 g by mouth as needed.   sodium chloride 1 g tablet Take 1 g  by mouth 3 (three) times daily. What changed: Another medication with the same name was removed. Continue taking this medication, and follow the directions you see here. Changed by: [name], MD   tamsulosin 0.4 MG Caps capsule Commonly known as: FLOMAX Take 0.4 mg by mouth daily.   triamcinolone cream 0.1 % Commonly known as: KENALOG Apply 1 Application topically 2 (two) times daily.   triamcinolone cream 0.1 % Commonly known as: KENALOG Apply 1 Application topically as needed.   Tums Smoothies 750 MG chewable tablet Generic drug: calcium carbonate Chew 2 tablets by mouth 4 (four) times daily as needed for heartburn.        Review of Systems  Vitals:   08/26/22 1547  BP: (!) 140/82  Pulse: 60  Resp: 20  Temp: 97.6 F (36.4 C)  SpO2: 96%  Weight: 173 lb (78.5 kg)  Height: '5\' 5"'$  (1.651 m)   Body mass index is 28.79 kg/m. Physical Exam  Labs reviewed: Basic Metabolic Panel: Recent Labs    01/26/22 0547 01/27/22 0333 02/01/22 0000 03/07/22 0000 06/24/22 0000 08/24/22 1447  NA 129* 131*   < > 138 139 125*  K 4.5 4.7   < > 4.4 5.1 4.3  CL 99 104   < > 103 100 92*  CO2 23 17*   < > 25* 26* 25  GLUCOSE 100* 108*  --   --   --  102*  BUN 10 17   < > '15 16 14  '$ CREATININE 0.64 0.76   < > 0.7 0.7 0.73  CALCIUM 8.7* 7.6*   < > 8.9 9.4 9.1  MG  --   --   --   --   --  1.7   < > = values in this interval not displayed.   Liver Function Tests: Recent Labs    08/24/22 1447  AST 27  ALT 26  ALKPHOS 116  BILITOT 0.5  PROT 7.3  ALBUMIN 4.3   No results for input(s): "LIPASE", "AMYLASE" in the last 8760 hours. No results for input(s): "AMMONIA" in the last 8760 hours. CBC: Recent Labs    01/19/22 1139 01/19/22 1143 01/20/22 0250 01/21/22 0844 02/01/22 0000 06/24/22 0000 08/24/22 1447  WBC 5.3  --  4.1 4.3 7.4 7.3 4.6  NEUTROABS 2.4  --   --   --   --  4.00 1.9  HGB 11.6*   < > 11.7* 11.6* 12.1* 12.4* 12.0*  HCT 32.2*   < > 31.7* 31.5* 35* 37*  32.3*  MCV 90.4  --  91.9 88.5  --   --  88.7  PLT 234  --  213 236 242 257 219   < > = values in this interval not displayed.   Cardiac Enzymes: No results for input(s): "CKTOTAL", "CKMB", "CKMBINDEX", "TROPONINI" in the last 8760 hours. BNP: Invalid input(s): "POCBNP" No results found for: "HGBA1C" Lab Results  Component Value Date   TSH 2.685 01/19/2022   Lab Results  Component Value Date   [id number] 03/15/2021   No results found for: "FOLATE" No results found for: "IRON", "TIBC", "FERRITIN"  Imaging and Procedures obtained prior to SNF admission: CT HEAD WO CONTRAST (5MM)  Result Date: 08/24/2022 CLINICAL DATA:  Head trauma.  Fall 2-3 days ago with head strike. EXAM: CT HEAD WITHOUT CONTRAST TECHNIQUE: Contiguous  axial images were obtained from the base of the skull through the vertex without intravenous contrast. RADIATION DOSE REDUCTION: This exam was performed according to the departmental dose-optimization program which includes automated exposure control, adjustment of the mA and/or kV according to patient size and/or use of iterative reconstruction technique. COMPARISON:  Head CT 01/14/2022. FINDINGS: Brain: Unchanged postoperative appearance from prior right anterior temporal lobectomy. Unchanged encephalomalacia along the right anterior cingulate gyrus. Gray-white differentiation is otherwise preserved no acute intracranial hemorrhage. No hydrocephalus or extra-axial collection. Vascular: No hyperdense vessel or unexpected calcification. Skull: Prior right frontotemporal craniotomy. Sinuses/Orbits: Mild mucosal disease throughout the paranasal sinuses. Trace opacification of the left mastoid. Orbits are unremarkable. Other: None. IMPRESSION: 1. No acute intracranial abnormality. 2. Unchanged postoperative appearance from prior right anterior temporal lobectomy. Unchanged encephalomalacia along the right anterior cingulate gyrus. Electronically Signed   By: Emmit Alexanders M.D.    On: 08/24/2022 15:48    Assessment/Plan There are no diagnoses linked to this encounter.   Family/ staff Communication:   Labs/tests ordered:

## 2022-08-27 ENCOUNTER — Encounter: Payer: Self-pay | Admitting: Orthopedic Surgery

## 2022-08-27 ENCOUNTER — Non-Acute Institutional Stay (SKILLED_NURSING_FACILITY): Payer: Medicare Other | Admitting: Orthopedic Surgery

## 2022-08-27 DIAGNOSIS — G40909 Epilepsy, unspecified, not intractable, without status epilepticus: Secondary | ICD-10-CM | POA: Diagnosis not present

## 2022-08-27 DIAGNOSIS — R296 Repeated falls: Secondary | ICD-10-CM | POA: Diagnosis not present

## 2022-08-27 DIAGNOSIS — E871 Hypo-osmolality and hyponatremia: Secondary | ICD-10-CM

## 2022-08-27 NOTE — Progress Notes (Signed)
Location:   [facility] Room Number: 154-A Place of Service:  SNF [id number]) Provider:  Windell Moulding, NP  PCP: [name], MD  Patient Care Team: [name], MD as PCP - General (Internal Medicine) Sanda Klein, MD as PCP - Cardiology (Cardiology) Rutherford Guys, MD as Consulting Physician (Ophthalmology) Kathrynn Ducking, MD (Inactive) as Consulting Physician (Neurology) Gayland Curry, DO (Geriatric Medicine)  Extended Emergency Contact Information Primary Emergency Contact: Duckett,[name] Address: [address] [address] Johnnette Litter of [place] Phone: [phone] Mobile Phone: [phone] Relation: Spouse Secondary Emergency Contact: Stargell,Jasun Mobile Phone: [phone] Relation: Son  Code Status:  DNR Goals of care: Advanced Directive information    08/27/2022   10:27 AM  Advanced Directives  Does Patient Have a Medical Advance Directive? Yes  Type of Advance Directive Living will;Out of facility DNR (pink MOST or yellow form)  Does patient want to make changes to medical advance directive? No - Patient declined     Chief Complaint  Patient presents with   Medical Management of Chronic Issues    Routine Visit.    Immunizations    Discuss the need for Shingrix vaccine, and Covid Booster.    Health Maintenance    Discuss the need for [name].    HPI:  Pt is a 87 y.o. male seen today for acute visit due to hyponatremia.   He currently resides on the rehabilitation unit at [facility]. PMH: atrial fibrillation, HLD, Parkinsonian syndrome, s/p thyroid nodule, seizure disorder, status epilepticus, Grover's disease, osteopenia, BPH, and abnormal gait.   03/09 ED visit due to fall. He fell backwards and hit head on rocking chair. Xrays negative for acute fractures/dislocations. Wife reported increased confusion x 2 days. Na+ 126, he was given 1L IV NS. Phenytoin level 8.1.He was discharged to  [facility] rehab. He stayed a brief time in rehab and was discharged back to [place]. 03/11 he was noted to have increased lethargy and readmitted back to rehab. He was given 1L IV NS. Na+ was found to be 126. He was started on sodium tablets 2g BID. He was given extra dose as well.   Today he has completed IV fluids. He is requesting to go back home. Still confused at times per nursing. He is eating and drinking well.      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[place])    Urinary urgency    Past Surgical History:  Procedure Laterality Date   CARDIAC DEFIBRILLATOR PLACEMENT     cataract surgery     COLONOSCOPY     CRANIOTOMY Right    right anterior temporal resection   defibrillator replaced  March 2016   IMPLANTATION VAGAL NERVE STIMULATOR  June 2016   NASAL SINUS SURGERY     TONSILLECTOMY     age 40   VASECTOMY  69    Allergies  Allergen Reactions   Depakote [Divalproex Sodium] Other (See Comments)    Arthralgias     Allergies as of 08/27/2022       Reactions   Depakote [divalproex Sodium] Other (See Comments)   Arthralgias        Medication List        Accurate as of August 27, 2022 10:27 AM. If you have any questions, ask your nurse or doctor.          acetaminophen 325 MG tablet Commonly known as: TYLENOL Take 650 mg by mouth every 6 (six) hours as needed for mild pain or moderate pain.   ALPRAZolam 0.25 MG tablet Commonly known as: XANAX Take 0.25 mg by mouth 2 (two) times daily as needed for anxiety.   brimonidine-timolol  0.2-0.5 % ophthalmic solution Commonly known as: COMBIGAN Place 1 drop into both eyes every 12 (twelve) hours.   camphor-menthol lotion Commonly known as: SARNA Apply 1 Application topically every 8 (eight) hours as needed for itching.   cetirizine 10 MG tablet Commonly known as: ZYRTEC Take 10 mg by mouth as needed for allergies.   cholecalciferol 10 MCG (400 UNIT) Tabs tablet Commonly known as: VITAMIN D3 Take 400 Units by mouth 2 (two) times daily.   clobetasol cream 0.05 % Commonly known as: TEMOVATE Apply 1 application  topically 2 (two) times daily as needed (rash).   Dilantin 100 MG ER capsule Generic drug: phenytoin Take 300 mg by mouth every evening.   Dilantin 100 MG ER capsule Generic drug: phenytoin Take 300 mg by mouth. In the evening   econazole nitrate 1 % cream Apply 1 Application topically 2 (two) times daily as needed.   finasteride 5 MG tablet Commonly known as: PROSCAR Take 5 mg by mouth every morning.   flecainide 50 MG tablet Commonly known as: TAMBOCOR Take 50 mg by mouth 2 (two) times daily.   hydrALAZINE 10 MG tablet Commonly known as: APRESOLINE Take 1 tablet (10 mg total) by mouth 2 (two) times daily as needed.   lacosamide 200 MG Tabs tablet Commonly known as: VIMPAT Take 200 mg by mouth 2 (two) times daily.   latanoprost 0.005 % ophthalmic solution Commonly known as: XALATAN Place 1 drop into both eyes at bedtime.   levETIRAcetam 750 MG 24 hr tablet Commonly known as: KEPPRA XR Take 2,250 mg by mouth daily.   LORazepam 2 MG/ML concentrated solution Commonly known as: ATIVAN Take 0.5 mg by mouth as needed for anxiety.   miconazole 2 % powder Commonly known as: MICOTIN Apply 1 Application topically 2 (two) times daily.   phenytoin 50 MG tablet Commonly known as: DILANTIN Chew 75 mg by mouth daily.   polyethylene glycol 17 g packet Commonly known as: MIRALAX / GLYCOLAX Take 17 g by mouth every other day.   polyethylene  glycol 17 g packet Commonly known as: MIRALAX / GLYCOLAX Take 17 g by mouth as needed.   sodium chloride 1 g tablet Take 1 g by mouth 2 (two) times daily with a meal.   tamsulosin 0.4 MG Caps capsule Commonly known as: FLOMAX Take 0.4 mg by mouth daily.   triamcinolone cream 0.1 % Commonly known as: KENALOG Apply 1 Application topically 2 (two) times daily.   triamcinolone cream 0.1 % Commonly known as: KENALOG Apply 1 Application topically as needed.   Tums Smoothies 750 MG chewable tablet Generic drug: calcium carbonate Chew 2 tablets by mouth 4 (four) times daily as needed for heartburn.  Review of Systems  Constitutional:  Negative for activity change and appetite change.  HENT:  Negative for congestion.   Eyes:  Positive for visual disturbance.  Respiratory:  Negative for cough, shortness of breath and wheezing.   Cardiovascular:  Negative for chest pain and leg swelling.  Gastrointestinal:  Negative for abdominal distention and abdominal pain.  Genitourinary:  Negative for dysuria.  Musculoskeletal:  Positive for gait problem.  Skin:  Negative for wound.  Neurological:  Positive for weakness. Negative for dizziness and headaches.  Psychiatric/Behavioral:  Negative for sleep disturbance.     Immunization History  Administered Date(s) Administered   Fluad Quad(high Dose 65+) 03/22/2022   Influenza, High Dose Seasonal PF 04/07/2020   Influenza, Seasonal, Injecte, Preservative Fre 02/22/2010   Influenza,inj,Quad PF,6+ Mos 04/07/2018, 04/15/2019   Influenza-Unspecified 03/17/2013, 02/24/2014, 02/16/2015, 03/21/2016, 03/17/2017, 04/07/2018, 03/21/2021   Moderna SARS-COV2 Booster Vaccination 01/11/2021, 03/28/2021   Moderna Sars-Covid-2 Vaccination 06/28/2019, 07/27/2019, 05/01/2020, 04/22/2022   Pneumococcal Conjugate-13 07/05/2013   Pneumococcal Polysaccharide-23 06/17/2006   Td 06/18/2007   Tdap 12/07/2015, 09/17/2020   Zoster Recombinat (Shingrix)  12/04/2016   Zoster, Live 10/06/2008   Pertinent  Health Maintenance Due  Topic Date Due   INFLUENZA VACCINE  Completed      01/26/2022    7:59 PM 01/27/2022    8:00 AM 06/18/2022   10:38 AM 07/08/2022    9:52 AM 07/08/2022    3:03 PM  Fall Risk  Falls in the past year?   0 1 1  Was there an injury with Fall?   0 0 0  Fall Risk Category Calculator   0 1 1  Fall Risk Category (Retired)   Low    (RETIRED) Patient Fall Risk Level High fall risk High fall risk High fall risk    Patient at Risk for Falls Due to   History of fall(s) History of fall(s) History of fall(s);Impaired balance/gait;Impaired mobility  Fall risk Follow up   Falls evaluation completed Falls evaluation completed Falls evaluation completed   Functional Status Survey:    Vitals:   08/27/22 1021  BP: (!) 146/76  Pulse: 60  Resp: 17  Temp: (!) 97.2 F (36.2 C)  SpO2: 94%  Weight: 173 lb (78.5 kg)  Height: '5\' 5"'$  (1.651 m)   Body mass index is 28.79 kg/m. Physical Exam Vitals reviewed.  Constitutional:      General: He is not in acute distress. HENT:     Head: Normocephalic.     Right Ear: There is no impacted cerumen.     Left Ear: There is no impacted cerumen.     Nose: Nose normal.     Mouth/Throat:     Mouth: Mucous membranes are moist.  Eyes:     General:        Right eye: No discharge.        Left eye: No discharge.  Cardiovascular:     Rate and Rhythm: Normal rate. Rhythm irregular.     Pulses: Normal pulses.     Heart sounds: Normal heart sounds.  Pulmonary:     Effort: Pulmonary effort is normal. No respiratory distress.     Breath sounds: Normal breath sounds. No wheezing.  Abdominal:     General: Bowel sounds are normal. There is no distension.     Palpations: Abdomen is soft.     Tenderness: There is no abdominal tenderness.  Musculoskeletal:     Cervical back: Neck supple.     Right lower leg: No  edema.     Left lower leg: No edema.  Skin:    General: Skin is warm and dry.      Capillary Refill: Capillary refill takes less than 2 seconds.  Neurological:     General: No focal deficit present.     Mental Status: He is alert. Mental status is at baseline.     Motor: Weakness present.     Gait: Gait abnormal.  Psychiatric:        Mood and Affect: Mood normal.     Comments: Alert to self/place, disorientated to situation and time, follows commands      Labs reviewed: Recent Labs    01/26/22 0547 01/27/22 0333 02/01/22 0000 06/24/22 0000 08/24/22 1447 08/26/22 0000  NA 129* 131*   < > 139 125* 126*  K 4.5 4.7   < > 5.1 4.3 4.7  CL 99 104   < > 100 92* 95*  CO2 23 17*   < > 26* 25 17  GLUCOSE 100* 108*  --   --  102*  --   BUN 10 17   < > '16 14 11  '$ CREATININE 0.64 0.76   < > 0.7 0.73 0.6  CALCIUM 8.7* 7.6*   < > 9.4 9.1 8.8  MG  --   --   --   --  1.7  --    < > = values in this interval not displayed.   Recent Labs    08/24/22 1447  AST 27  ALT 26  ALKPHOS 116  BILITOT 0.5  PROT 7.3  ALBUMIN 4.3   Recent Labs    01/19/22 1139 01/19/22 1143 01/20/22 0250 01/21/22 0844 02/01/22 0000 06/24/22 0000 08/24/22 1447  WBC 5.3  --  4.1 4.3 7.4 7.3 4.6  NEUTROABS 2.4  --   --   --   --  4.00 1.9  HGB 11.6*   < > 11.7* 11.6* 12.1* 12.4* 12.0*  HCT 32.2*   < > 31.7* 31.5* 35* 37* 32.3*  MCV 90.4  --  91.9 88.5  --   --  88.7  PLT 234  --  213 236 242 257 219   < > = values in this interval not displayed.   Lab Results  Component Value Date   TSH 2.685 01/19/2022   No results found for: "HGBA1C" Lab Results  Component Value Date   CHOL 214 (A) 06/24/2022   HDL 83 (A) 06/24/2022   LDLCALC 141 05/15/2021   TRIG 59 06/24/2022    Significant Diagnostic Results in last 30 days:  CT HEAD WO CONTRAST (5MM)  Result Date: 08/24/2022 CLINICAL DATA:  Head trauma.  Fall 2-3 days ago with head strike. EXAM: CT HEAD WITHOUT CONTRAST TECHNIQUE: Contiguous axial images were obtained from the base of the skull through the vertex without intravenous  contrast. RADIATION DOSE REDUCTION: This exam was performed according to the departmental dose-optimization program which includes automated exposure control, adjustment of the mA and/or kV according to patient size and/or use of iterative reconstruction technique. COMPARISON:  Head CT 01/14/2022. FINDINGS: Brain: Unchanged postoperative appearance from prior right anterior temporal lobectomy. Unchanged encephalomalacia along the right anterior cingulate gyrus. Gray-white differentiation is otherwise preserved no acute intracranial hemorrhage. No hydrocephalus or extra-axial collection. Vascular: No hyperdense vessel or unexpected calcification. Skull: Prior right frontotemporal craniotomy. Sinuses/Orbits: Mild mucosal disease throughout the paranasal sinuses. Trace opacification of the left mastoid. Orbits are unremarkable. Other: None. IMPRESSION: 1. No acute intracranial abnormality. 2. Unchanged  postoperative appearance from prior right anterior temporal lobectomy. Unchanged encephalomalacia along the right anterior cingulate gyrus. Electronically Signed   By: Emmit Alexanders M.D.   On: 08/24/2022 15:48    Assessment/Plan: 1. Hyponatremia - 03/09 Na+ 126 in ED> given 1L IV NS - 03/11 Na+ 126> readmitted to [place] rehab - 03/11 given 1L IV NS/ sodium 2g tablets BID started - confused but no behaviors - eating and drinking well> fluid restriction 1500cc/daily - repeat bmp > if still low consider increasing sodium tablets to TID - Schedule bmp 03/14  2. Seizure disorder (HCC) - Phenytoin 8.1 08/24/2022 - no recent episodes - cont Dilantin, Keppra and Vimpat  3. Falls frequently - cont rehab - cont PT/OT    Family/ staff Communication: plan discussed with patient and nurse  Labs/tests ordered: none

## 2022-08-28 ENCOUNTER — Encounter (HOSPITAL_BASED_OUTPATIENT_CLINIC_OR_DEPARTMENT_OTHER): Payer: Self-pay | Admitting: Emergency Medicine

## 2022-08-28 ENCOUNTER — Emergency Department (HOSPITAL_BASED_OUTPATIENT_CLINIC_OR_DEPARTMENT_OTHER)
Admission: EM | Admit: 2022-08-28 | Discharge: 2022-08-28 | Disposition: A | Discharge status: Home / Self Care | Payer: Medicare Other | Attending: Emergency Medicine | Admitting: Emergency Medicine

## 2022-08-28 ENCOUNTER — Other Ambulatory Visit: Payer: Self-pay

## 2022-08-28 DIAGNOSIS — E871 Hypo-osmolality and hyponatremia: Secondary | ICD-10-CM | POA: Insufficient documentation

## 2022-08-28 DIAGNOSIS — Z79899 Other long term (current) drug therapy: Secondary | ICD-10-CM | POA: Insufficient documentation

## 2022-08-28 DIAGNOSIS — G20C Parkinsonism, unspecified: Secondary | ICD-10-CM | POA: Insufficient documentation

## 2022-08-28 LAB — CBC WITH DIFFERENTIAL/PLATELET
Abs Immature Granulocytes: 0.02 10*3/uL (ref 0.00–0.07)
Basophils Absolute: 0.1 10*3/uL (ref 0.0–0.1)
Basophils Relative: 1 %
Eosinophils Absolute: 0.6 10*3/uL — ABNORMAL HIGH (ref 0.0–0.5)
Eosinophils Relative: 12 %
HCT: 29.9 % — ABNORMAL LOW (ref 39.0–52.0)
Hemoglobin: 11 g/dL — ABNORMAL LOW (ref 13.0–17.0)
Immature Granulocytes: 0 %
Lymphocytes Relative: 29 %
Lymphs Abs: 1.5 10*3/uL (ref 0.7–4.0)
MCH: 32.9 pg (ref 26.0–34.0)
MCHC: 36.8 g/dL — ABNORMAL HIGH (ref 30.0–36.0)
MCV: 89.5 fL (ref 80.0–100.0)
Monocytes Absolute: 0.7 10*3/uL (ref 0.1–1.0)
Monocytes Relative: 14 %
Neutro Abs: 2.2 10*3/uL (ref 1.7–7.7)
Neutrophils Relative %: 44 %
Platelets: 212 10*3/uL (ref 150–400)
RBC: 3.34 MIL/uL — ABNORMAL LOW (ref 4.22–5.81)
RDW: 12.4 % (ref 11.5–15.5)
WBC: 5.1 10*3/uL (ref 4.0–10.5)
nRBC: 0 % (ref 0.0–0.2)

## 2022-08-28 LAB — COMPREHENSIVE METABOLIC PANEL
ALT: 16 U/L (ref 0–44)
AST: 20 U/L (ref 15–41)
Albumin: 4 g/dL (ref 3.5–5.0)
Alkaline Phosphatase: 116 U/L (ref 38–126)
Anion gap: 6 (ref 5–15)
BUN: 10 mg/dL (ref 8–23)
CO2: 25 mmol/L (ref 22–32)
Calcium: 8.9 mg/dL (ref 8.9–10.3)
Chloride: 97 mmol/L — ABNORMAL LOW (ref 98–111)
Creatinine, Ser: 0.62 mg/dL (ref 0.61–1.24)
GFR, Estimated: >60 mL/min (ref >60)
Glucose, Bld: 98 mg/dL (ref 70–99)
Potassium: 4.2 mmol/L (ref 3.5–5.1)
Sodium: 128 mmol/L — ABNORMAL LOW (ref 135–145)
Total Bilirubin: 0.6 mg/dL (ref 0.3–1.2)
Total Protein: 6.9 g/dL (ref 6.5–8.1)

## 2022-08-28 MED ORDER — SODIUM CHLORIDE 0.9 % IV BOLUS
1000.0000 mL | Freq: Once | INTRAVENOUS | Status: AC
  Administered 2022-08-28: 1000 mL via INTRAVENOUS

## 2022-08-28 NOTE — ED Triage Notes (Signed)
Pt via pov from [facility] (is normally in assisted living) with continued hyponatremia. He has been receiving salt pills since his discharge from this ED on Saturday. Apparently this has been an ongoing problem as he was hospitalized for the same a year ago. They have also been giving him a slow drip of NS. Pt's wife states this causes confusion, balance issues, fatigue. Pt alert &  oriented x 4; nad noted.

## 2022-08-28 NOTE — ED Notes (Signed)
Discharge paperwork given and verbally understood. 

## 2022-08-28 NOTE — ED Notes (Addendum)
Discharge paperwork given and verbally understood by the Pt and family. 

## 2022-08-28 NOTE — ED Provider Notes (Signed)
[facility] Provider Note   CSN: [id number] Arrival date & time: 08/28/22  1227     History  Chief Complaint  Patient presents with   hyponatremia    Cameron Potts is a 87 y.o. male with past medical history significant for cerebral salt wasting syndrome, seizures, A-fib, parkinsonian syndrome, vagus nerve stimulator, hyponatremia, metabolic encephalopathy presents to the ED with concerns of hyponatremia.  Patient is coming in [place] from [place] rehab with continued hyponatremia despite increasing salt tablets and giving IVF.  Patient has been receiving increase in his salt pill since his discharge from ED on Saturday.  Patient does have chronically low sodium with his baseline being 130-133.  Patient was seen in ED on 08/24/2022 for a fall.  Patient's wife states that [facility] has been giving him a slow drip of normal saline which took approximately 13 hours to complete.  Patient's wife states patient has been having increased confusion, balance issues and fatigue related to his low sodium.  Patient and his wife are requesting normal saline IV fluids to help boost his sodium.  Denies chest pain, shortness of breath, decreased urine, dizziness, lightheadedness, headache, seizure, syncope, weakness, confusion.  Patient's last sodium was 126.        Home Medications Prior to Admission medications   Medication Sig Start Date End Date Taking? Authorizing Provider  acetaminophen (TYLENOL) 325 MG tablet Take 650 mg by mouth every 6 (six) hours as needed for mild pain or moderate pain.    [provider]  ALPRAZolam Duanne Moron) 0.25 MG tablet Take 0.25 mg by mouth 2 (two) times daily as needed for anxiety.    [provider]  brimonidine-timolol (COMBIGAN) 0.2-0.5 % ophthalmic solution Place 1 drop into both eyes every 12 (twelve) hours.  01/05/18   [provider]  calcium carbonate (TUMS SMOOTHIES) 750 MG chewable  tablet Chew 2 tablets by mouth 4 (four) times daily as needed for heartburn.    [provider]  camphor-menthol Timoteo Ace) lotion Apply 1 Application topically every 8 (eight) hours as needed for itching.    [provider]  cetirizine (ZYRTEC) 10 MG tablet Take 10 mg by mouth as needed for allergies.    [provider]  cholecalciferol (VITAMIN D) 400 units TABS tablet Take 400 Units by mouth 2 (two) times daily.    [provider]  clobetasol cream (TEMOVATE) [id number] % Apply 1 application  topically 2 (two) times daily as needed (rash).    [provider]  DILANTIN 100 MG ER capsule Take 300 mg by mouth. In the evening 02/14/22   [provider]  econazole nitrate 1 % cream Apply 1 Application topically 2 (two) times daily as needed.    [provider]  finasteride (PROSCAR) 5 MG tablet Take 5 mg by mouth every morning. 02/12/19   [provider]  flecainide (TAMBOCOR) 50 MG tablet Take 50 mg by mouth 2 (two) times daily.    [provider]  hydrALAZINE (APRESOLINE) 10 MG tablet Take 1 tablet (10 mg total) by mouth 2 (two) times daily as needed. 06/25/22   Royal Hawthorn, NP  lacosamide (VIMPAT) 200 MG TABS tablet Take 200 mg by mouth 2 (two) times daily.    [provider]  latanoprost (XALATAN) 0.005 % ophthalmic solution Place 1 drop into both eyes at bedtime.    [provider]  Levetiracetam 750 MG TB24 Take 2,250 mg by mouth daily.  [provider]  LORazepam (ATIVAN) 2 MG/ML concentrated solution Take 0.5 mg by mouth as needed for anxiety.    [provider]  miconazole (MICOTIN) 2 % powder Apply 1 Application topically 2 (two) times daily.    [provider]  phenytoin (DILANTIN) 100 MG ER capsule Take 300 mg by mouth every evening.    [provider]  phenytoin (DILANTIN) 50 MG tablet Chew 75 mg by mouth daily.    [provider]  polyethylene glycol  (MIRALAX / GLYCOLAX) 17 g packet Take 17 g by mouth as needed.    [provider]  polyethylene glycol (MIRALAX / GLYCOLAX) packet Take 17 g by mouth every other day.    [provider]  sodium chloride 1 g tablet Take 2 g by mouth 2 (two) times daily with a meal.    [provider]  tamsulosin (FLOMAX) 0.4 MG CAPS capsule Take 0.4 mg by mouth daily.    [provider]  triamcinolone cream (KENALOG) 0.1 % Apply 1 Application topically 2 (two) times daily.    [provider]  triamcinolone cream (KENALOG) 0.1 % Apply 1 Application topically as needed.    [provider]      Allergies    Depakote [divalproex sodium]    Review of Systems   Review of Systems  Constitutional:  Positive for fatigue.  Respiratory:  Negative for shortness of breath.   Cardiovascular:  Negative for chest pain.  Genitourinary:  Negative for decreased urine volume.  Neurological:  Negative for dizziness, seizures, syncope, weakness, light-headedness and headaches.  Psychiatric/Behavioral:  Negative for confusion.     Physical Exam Updated Vital Signs BP (!) 187/66 (BP Location: Right Arm)   Pulse 60   Temp 98 F (36.7 C) (Oral)   Resp 16   Ht '5\' 9"'$  (1.753 m)   Wt 78.5 kg   SpO2 96%   BMI 25.55 kg/m  Physical Exam Vitals and nursing note reviewed.  Constitutional:      General: He is not in acute distress.    Appearance: He is ill-appearing.  HENT:     Mouth/Throat:     Mouth: Mucous membranes are moist.     Pharynx: Oropharynx is clear.  Cardiovascular:     Rate and Rhythm: Normal rate and regular rhythm.     Pulses: Normal pulses.     Heart sounds: Normal heart sounds.  Pulmonary:     Effort: Pulmonary effort is normal. No respiratory distress.     Breath sounds: Normal breath sounds and air entry.  Abdominal:     General: Abdomen is flat. Bowel sounds are normal. There is no distension.     Palpations: Abdomen is soft.     Tenderness:  There is no abdominal tenderness.  Musculoskeletal:     Right lower leg: No edema.     Left lower leg: No edema.  Skin:    General: Skin is warm and dry.     Capillary Refill: Capillary refill takes less than 2 seconds.  Neurological:     General: No focal deficit present.     Mental Status: He is alert and oriented to person, place, and time. Mental status is at baseline.  Psychiatric:        Mood and Affect: Mood normal.        Behavior: Behavior normal.     ED Results / Procedures / Treatments   Labs (all labs ordered are listed, but only abnormal results  are displayed) Labs Reviewed  COMPREHENSIVE METABOLIC PANEL - Abnormal; Notable for the following components:      Result Value   Sodium 128 (*)    Chloride 97 (*)    All other components within normal limits  CBC WITH DIFFERENTIAL/PLATELET - Abnormal; Notable for the following components:   RBC 3.34 (*)    Hemoglobin 11.0 (*)    HCT 29.9 (*)    MCHC 36.8 (*)    Eosinophils Absolute 0.6 (*)    All other components within normal limits    EKG None  Radiology No results found.  Procedures Procedures    Medications Ordered in ED Medications  sodium chloride 0.9 % bolus 1,000 mL (0 mLs Intravenous Stopped 08/28/22 1549)    ED Course/ Medical Decision Making/ A&P                             Medical Decision Making Amount and/or Complexity of Data Reviewed Labs: ordered.   This patient presents to the ED with chief complaint(s) of hyponatremia with pertinent past medical history of hyponatremia, cerebral salt wasting syndrome.  The complaint involves an extensive differential diagnosis and also carries with it a high risk of complications and morbidity.    The differential diagnosis includes hyponatremia, metabolic derangement, renal dysfunction  The initial plan is to obtain baseline labs to assess sodium  Additional history obtained: Additional history obtained from spouse Records reviewed Nursing Home  Documents  Initial Assessment:   Exam significant for a chronically ill-appearing patient who is not in acute distress.  Heart rate is normal with regular rhythm.  Lungs are clear to auscultation bilaterally.  Patient does not have appreciable bilateral lower edema.  He does not appear to be fluid overloaded.  Abdomen is soft and nontender to palpation.  Independent ECG/labs interpretation:  The following labs were independently interpreted:  CBC without leukocytosis, he does have anemia but appears to be chronically anemic.  Metabolic panel with a sodium of 128 which is up from 126 yesterday.  No other electrolyte disturbances.  LFTs are within normal range.  Renal function appears normal.  Treatment and Reassessment: Patient was given 1 L NS IV fluids to help with hyponatremia.  Patient and wife are requesting discharge following fluid administration.  Patient to continue taking his increased amount of sodium tablets.  Patient is taking a total of 6 g/day beginning today.  Advised patient to have [facility] continue to monitor his sodium and to return to the ED if it does not improve.  Upon reassessment following fluids, patient is snacking on crackers and drinking orange juice.  He reports that he feels improved.  Patient is not confused and is alert and oriented.  I believe patient is appropriate for discharge back to [facility].  Disposition:   The patient has been appropriately medically screened and/or stabilized in the ED. I have low suspicion for any other emergent medical condition which would require further screening, evaluation or treatment in the ED or require inpatient management. At time of discharge the patient is hemodynamically stable and in no acute distress. I have discussed work-up results and diagnosis with patient and answered all questions. Patient is agreeable with discharge plan. We discussed strict return precautions for returning to the emergency department and they  verbalized understanding.            Final Clinical Impression(s) / ED Diagnoses Final diagnoses:  Hyponatremia    Rx /  DC Orders ED Discharge Orders     None         Pat Kocher, [place] 08/28/22 1733    Davonna Belling, MD 08/29/22 [phone]

## 2022-08-28 NOTE — Discharge Instructions (Signed)
Thank you for allowing me to be part of your care today.  You received 1 L of normal saline fluid bolus to help with your hyponatremia.  Please continue to have this monitored and return to the ED if you have any worsening of your symptoms or have any new concerns.  Please continue to take your salt tablets as prescribed.

## 2022-08-29 ENCOUNTER — Ambulatory Visit: Payer: PRIVATE HEALTH INSURANCE | Attending: Cardiovascular Disease | Admitting: Cardiovascular Disease

## 2022-08-29 DIAGNOSIS — I10 Essential (primary) hypertension: Secondary | ICD-10-CM | POA: Diagnosis not present

## 2022-08-30 ENCOUNTER — Encounter: Payer: Self-pay | Admitting: Adult Health

## 2022-08-30 ENCOUNTER — Non-Acute Institutional Stay (SKILLED_NURSING_FACILITY): Payer: Medicare Other | Admitting: Adult Health

## 2022-08-30 DIAGNOSIS — E871 Hypo-osmolality and hyponatremia: Secondary | ICD-10-CM | POA: Diagnosis not present

## 2022-08-30 DIAGNOSIS — G40909 Epilepsy, unspecified, not intractable, without status epilepticus: Secondary | ICD-10-CM | POA: Diagnosis not present

## 2022-08-30 DIAGNOSIS — R269 Unspecified abnormalities of gait and mobility: Secondary | ICD-10-CM

## 2022-08-30 DIAGNOSIS — I10 Essential (primary) hypertension: Secondary | ICD-10-CM | POA: Diagnosis not present

## 2022-08-30 LAB — BASIC METABOLIC PANEL
BUN: 8 (ref 4–21)
CO2: 21 (ref 13–22)
Chloride: 99 (ref 99–108)
Creatinine: 0.6 (ref 0.6–1.3)
Glucose: 89
Sodium: 130 — AB (ref 137–147)

## 2022-08-30 LAB — COMPREHENSIVE METABOLIC PANEL
Calcium: 8.5 — AB (ref 8.7–10.7)
eGFR: >90

## 2022-08-30 NOTE — Progress Notes (Signed)
Location:  Medical illustrator of Service:  SNF (31) Provider:  Tamsen Roers, MD  Patient Care Team: Mahlon Gammon, MD as PCP - General (Internal Medicine) Thurmon Fair, MD as PCP - Cardiology (Cardiology) Jethro Bolus, MD as Consulting Physician (Ophthalmology) [name], MD (Inactive) as Consulting Physician (Neurology) Kermit Balo, DO (Geriatric Medicine)  Extended Emergency Contact Information Primary Emergency Contact: Graley,[name] Address: [address]          [address] [place], [place] [address] [place] Home Phone: [phone] Mobile Phone: [phone] Relation: Spouse Secondary Emergency Contact: Christiana Fuchs Home Phone: [phone] Mobile Phone: [phone] Relation: Son  Code Status:  DNR Goals of care: Advanced Directive information    08/30/2022   12:18 PM  Advanced Directives  Does Patient Have a Medical Advance Directive? Yes  Type of Advance Directive Living will;Out of facility DNR (pink MOST or yellow form)  Copy of Healthcare Power of Attorney in Chart? Yes - validated most recent copy scanned in chart (See row information)     Chief Complaint  Patient presents with   Acute Visit    Discharge visit   Immunizations    Discussed the need for Shingles vaccine     HPI:  Pt is a 87 y.o. male seen today for discharge from skilled rehab back to [facility].  PMH: atrial fibrillation, HLD, Parkinsonian syndrome, s/p thyroid nodule, seizure disorder with vagus nerve stimulator, status epilepticus, Grover's disease, osteopenia, BPH, and falls with abnormal gait.   08/24/22: ED visit due to fall. He fell backwards and hit his head. Xrays were negative for acute fx. CT of the head show no acute process. NA was 126. He was given 1 liter of NS and discharged.   Seen in the ED for hyponatremia 3/13. Na was 128. He was given 1 liter of NS.  Then discharged on sodium tablets.   Na 08/30/22  returned at 130. Pt independent in ADLs and back to baseline cognitively. Ready for discharge back to [facility].    Past Medical History:  Diagnosis Date   Abnormality of gait 12/21/2013   Atrial fibrillation (HCC)    Blind left eye    Carcinoma in situ of prostate    Cardiac arrest (HCC)    Glaucoma    Hypercholesteremia    Hyperlipidemia    Metabolic encephalopathy    Mild left ventricular systolic dysfunction    Presence of other specified functional implants    PSA elevation    Right frontal lobe lesion    Seizures (HCC)    Tinea pedis    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grover)    Urinary urgency    Past Surgical History:  Procedure Laterality Date   CARDIAC DEFIBRILLATOR PLACEMENT     cataract surgery     COLONOSCOPY     CRANIOTOMY Right    right anterior temporal resection   defibrillator replaced  March 2016   IMPLANTATION VAGAL NERVE STIMULATOR  June 2016   NASAL SINUS SURGERY     TONSILLECTOMY     age 62   VASECTOMY  13    Allergies  Allergen Reactions   Depakote [Divalproex Sodium] Other (See Comments)    Arthralgias     Outpatient Encounter Medications as of 08/30/2022  Medication Sig   acetaminophen (TYLENOL) 325 MG tablet Take 650 mg by mouth every 6 (six) hours as needed for mild pain or moderate pain.   ALPRAZolam (XANAX) 0.25  MG tablet Take 0.25 mg by mouth 2 (two) times daily as needed for anxiety.   brimonidine-timolol (COMBIGAN) 0.2-0.5 % ophthalmic solution Place 1 drop into both eyes every 12 (twelve) hours.    calcium carbonate (TUMS SMOOTHIES) 750 MG chewable tablet Chew 2 tablets by mouth 4 (four) times daily as needed for heartburn.   camphor-menthol (SARNA) lotion Apply 1 Application topically every 8 (eight) hours as needed for itching.   cetirizine (ZYRTEC) 10 MG tablet Take 10 mg by mouth as needed for allergies.   cholecalciferol (VITAMIN D) 400 units TABS tablet Take 400 Units by mouth 2 (two) times daily.   clobetasol cream (TEMOVATE)  0.05 % Apply 1 application  topically 2 (two) times daily as needed (rash).   econazole nitrate 1 % cream Apply 1 Application topically 2 (two) times daily as needed.   finasteride (PROSCAR) 5 MG tablet Take 5 mg by mouth every morning.   flecainide (TAMBOCOR) 50 MG tablet Take 50 mg by mouth 2 (two) times daily.   hydrALAZINE (APRESOLINE) 10 MG tablet Take 1 tablet (10 mg total) by mouth 2 (two) times daily as needed.   lacosamide (VIMPAT) 200 MG TABS tablet Take 200 mg by mouth 2 (two) times daily.   latanoprost (XALATAN) 0.005 % ophthalmic solution Place 1 drop into both eyes at bedtime.   Levetiracetam 750 MG TB24 Take 2,250 mg by mouth daily.   LORazepam (ATIVAN) 2 MG/ML concentrated solution Take 0.5 mg by mouth as needed for anxiety.   miconazole (MICOTIN) 2 % powder Apply 1 Application topically 2 (two) times daily.   phenytoin (DILANTIN) 100 MG ER capsule Take 300 mg by mouth every evening.   phenytoin (DILANTIN) 50 MG tablet Chew 75 mg by mouth daily.   polyethylene glycol (MIRALAX / GLYCOLAX) 17 g packet Take 17 g by mouth as needed.   polyethylene glycol (MIRALAX / GLYCOLAX) packet Take 17 g by mouth every other day.   sodium chloride 1 g tablet Take 2 g by mouth 2 (two) times daily with a meal.   tamsulosin (FLOMAX) 0.4 MG CAPS capsule Take 0.4 mg by mouth daily.   triamcinolone cream (KENALOG) 0.1 % Apply 1 Application topically 2 (two) times daily.   triamcinolone cream (KENALOG) 0.1 % Apply 1 Application topically as needed.   DILANTIN 100 MG ER capsule Take 300 mg by mouth. In the evening (Patient not taking: Reported on 08/30/2022)   No facility-administered encounter medications on file as of 08/30/2022.    Review of Systems  Constitutional:  Negative for activity change, appetite change, chills, diaphoresis, fatigue, fever and unexpected weight change.  Respiratory:  Negative for cough, shortness of breath, wheezing and stridor.   Cardiovascular:  Positive for leg  swelling. Negative for chest pain and palpitations.  Gastrointestinal:  Negative for abdominal distention, abdominal pain, constipation and diarrhea.  Genitourinary:  Negative for difficulty urinating and dysuria.  Musculoskeletal:  Positive for gait problem. Negative for arthralgias, back pain, joint swelling and myalgias.  Neurological:  Negative for dizziness, seizures, syncope, facial asymmetry, speech difficulty, weakness and headaches.  Hematological:  Negative for adenopathy. Does not bruise/bleed easily.  Psychiatric/Behavioral:  Negative for agitation, behavioral problems and confusion.     Immunization History  Administered Date(s) Administered   Fluad Quad(high Dose 65+) 03/22/2022   Influenza, High Dose Seasonal PF 04/07/2020   Influenza, Seasonal, Injecte, Preservative Fre 02/22/2010   Influenza,inj,Quad PF,6+ Mos 04/07/2018, 04/15/2019   Influenza-Unspecified 03/17/2013, 02/24/2014, 02/16/2015, 03/21/2016, 03/17/2017, 04/07/2018, 03/21/2021  Moderna SARS-COV2 Booster Vaccination 01/11/2021, 03/28/2021   Moderna Sars-Covid-2 Vaccination 06/28/2019, 07/27/2019, 05/01/2020, 04/22/2022   Pneumococcal Conjugate-13 07/05/2013   Pneumococcal Polysaccharide-23 06/17/2006   Td 06/18/2007   Tdap 12/07/2015, 09/17/2020   Zoster Recombinat (Shingrix) 12/04/2016   Zoster, Live 10/06/2008   Pertinent  Health Maintenance Due  Topic Date Due   INFLUENZA VACCINE  Completed      01/26/2022    7:59 PM 01/27/2022    8:00 AM 06/18/2022   10:38 AM 07/08/2022    9:52 AM 07/08/2022    3:03 PM  Fall Risk  Falls in the past year?   0 1 1  Was there an injury with Fall?   0 0 0  Fall Risk Category Calculator   0 1 1  Fall Risk Category (Retired)   Low    (RETIRED) Patient Fall Risk Level High fall risk High fall risk High fall risk    Patient at Risk for Falls Due to   History of fall(s) History of fall(s) History of fall(s);Impaired balance/gait;Impaired mobility  Fall risk Follow up    Falls evaluation completed Falls evaluation completed Falls evaluation completed   Functional Status Survey:    Vitals:   08/30/22 1212  BP: 136/62  Pulse: 61  Resp: 12  Temp: 97.8 F (36.6 C)  TempSrc: Temporal  SpO2: 96%  Weight: 171 lb 6.4 oz (77.7 kg)  Height: 5\' 9"  (1.753 m)   Body mass index is 25.31 kg/m. Physical Exam Vitals and nursing note reviewed.  Constitutional:      General: He is not in acute distress.    Appearance: He is not diaphoretic.  HENT:     Head: Normocephalic and atraumatic.  Neck:     Thyroid: No thyromegaly.     Vascular: No JVD.     Trachea: No tracheal deviation.  Cardiovascular:     Rate and Rhythm: Normal rate and regular rhythm.     Heart sounds: No murmur heard. Pulmonary:     Effort: Pulmonary effort is normal. No respiratory distress.     Breath sounds: Normal breath sounds. No wheezing.  Abdominal:     General: Bowel sounds are normal. There is no distension.     Palpations: Abdomen is soft.     Tenderness: There is no abdominal tenderness.  Musculoskeletal:     Comments: Trace edema to ankles  Lymphadenopathy:     Cervical: No cervical adenopathy.  Skin:    General: Skin is warm and dry.  Neurological:     General: No focal deficit present.     Mental Status: He is alert and oriented to person, place, and time.     Cranial Nerves: No cranial nerve deficit.     Labs reviewed: Recent Labs    01/27/22 0333 02/01/22 0000 08/24/22 1447 08/26/22 0000 08/28/22 1345  NA 131*   < > 125* 126* 128*  K 4.7   < > 4.3 4.7 4.2  CL 104   < > 92* 95* 97*  CO2 17*   < > 25 17 25   GLUCOSE 108*  --  102*  --  98  BUN 17   < > 14 11 10   CREATININE 0.76   < > 0.73 0.6 0.62  CALCIUM 7.6*   < > 9.1 8.8 8.9  MG  --   --  1.7  --   --    < > = values in this interval not displayed.   Recent Labs  08/24/22 1447 08/28/22 1345  AST 27 20  ALT 26 16  ALKPHOS 116 116  BILITOT 0.5 0.6  PROT 7.3 6.9  ALBUMIN 4.3 4.0   Recent  Labs    01/21/22 0844 02/01/22 0000 06/24/22 0000 08/24/22 1447 08/28/22 1345  WBC 4.3   < > 7.3 4.6 5.1  NEUTROABS  --   --  4.00 1.9 2.2  HGB 11.6*   < > 12.4* 12.0* 11.0*  HCT 31.5*   < > 37* 32.3* 29.9*  MCV 88.5  --   --  88.7 89.5  PLT 236   < > 257 219 212   < > = values in this interval not displayed.   Lab Results  Component Value Date   TSH 2.685 01/19/2022   No results found for: "HGBA1C" Lab Results  Component Value Date   CHOL 214 (A) 06/24/2022   HDL 83 (A) 06/24/2022   LDLCALC 141 05/15/2021   TRIG 59 06/24/2022    Significant Diagnostic Results in last 30 days:  CT HEAD WO CONTRAST ( )  Result Date: 08/24/2022 CLINICAL DATA:  Head trauma.  Fall 2-3 days ago with head strike. EXAM: CT HEAD WITHOUT CONTRAST TECHNIQUE: Contiguous axial images were obtained from the base of the skull through the vertex without intravenous contrast. RADIATION DOSE REDUCTION: This exam was performed according to the departmental dose-optimization program which includes automated exposure control, adjustment of the mA and/or kV according to patient size and/or use of iterative reconstruction technique. COMPARISON:  Head CT 01/14/2022. FINDINGS: Brain: Unchanged postoperative appearance from prior right anterior temporal lobectomy. Unchanged encephalomalacia along the right anterior cingulate gyrus. Gray-white differentiation is otherwise preserved no acute intracranial hemorrhage. No hydrocephalus or extra-axial collection. Vascular: No hyperdense vessel or unexpected calcification. Skull: Prior right frontotemporal craniotomy. Sinuses/Orbits: Mild mucosal disease throughout the paranasal sinuses. Trace opacification of the left mastoid. Orbits are unremarkable. Other: None. IMPRESSION: 1. No acute intracranial abnormality. 2. Unchanged postoperative appearance from prior right anterior temporal lobectomy. Unchanged encephalomalacia along the right anterior cingulate gyrus. Electronically  Signed   By: Orvan Falconer M.D.   On: 08/24/2022 15:48    Assessment/Plan  1. Hyponatremia Improved but on high dose sodium tablets and ready for discharge back to [facility] Will need bp checked bid after discharge back to [facility] Consider dose reduction after next sodium check in two weeks.  Could consider lasix if BP will tolerate.   2. Abnormality of gait With falls due to poor judgement, some cognitive issues along with low sodium Improved, ready for discharge.  F/U in clinic in two weeks   3. Seizure disorder (HCC) On vimpat, dilantin, keppra Followed by Neurology   4. HTN On the low side today recheck 105/62, on high dose sodium Monitor BP BID  Family/ staff Communication: resident and nurse.   Labs/tests ordered:  BMP 2 weeks

## 2022-09-02 ENCOUNTER — Encounter: Payer: Self-pay | Admitting: Adult Health

## 2022-09-03 ENCOUNTER — Ambulatory Visit (INDEPENDENT_AMBULATORY_CARE_PROVIDER_SITE_OTHER): Payer: Medicare Other | Admitting: Neurology

## 2022-09-03 ENCOUNTER — Encounter: Payer: Self-pay | Admitting: Neurology

## 2022-09-03 VITALS — BP 119/64 | HR 73 | Ht 69.0 in | Wt 175.0 lb

## 2022-09-03 DIAGNOSIS — Z9689 Presence of other specified functional implants: Secondary | ICD-10-CM

## 2022-09-03 DIAGNOSIS — G40919 Epilepsy, unspecified, intractable, without status epilepticus: Secondary | ICD-10-CM | POA: Diagnosis not present

## 2022-09-03 DIAGNOSIS — G40211 Localization-related (focal) (partial) symptomatic epilepsy and epileptic syndromes with complex partial seizures, intractable, with status epilepticus: Secondary | ICD-10-CM | POA: Diagnosis not present

## 2022-09-03 DIAGNOSIS — R269 Unspecified abnormalities of gait and mobility: Secondary | ICD-10-CM | POA: Diagnosis not present

## 2022-09-03 NOTE — Patient Instructions (Addendum)
Continue with Keppra 2250 mg daily Continue with Dilantin 75 mg in the morning and 300 mg in the evening Continue with Vimpat 200 mg twice daily VNS settings changed today patient tolerated procedure well Follow-up in 6 months or sooner if worse.

## 2022-09-03 NOTE — Progress Notes (Signed)
Reason for visit: Intractable seizures, gait disorder  Cameron Potts is an 87 y.o. male  Interval history 09/03/2022:  Cameron Potts presents today for follow-up, he is accompanied by grandson.  Last visit was in November.  Since then he has not had any seizure or seizure-like activity.  He continues to do well.  He swipes VNS twice a day at least, no adverse reaction.  He is compliant with his medication no side effects   Interval History 05/13/2022: Patient presents today for follow-up, he is accompanied by wife.  Since last visit in August, he had 1 breakthrough seizure in September.  At that time, we had tried him on clobazam but he could not tolerate due to side effects of somnolence and confusion.  Therefore clobazam has been discontinued.  He is currently on Vimpat, Dilantin, and Keppra.  He could not tolerate higher dose of VNS change due to cough and hoarseness.  He has not had any additional seizures.  He reports that he is back to his baseline.   Interval History 02/11/2022:  Cameron Potts presents today for follow-up, he is accompanied by his wife. Since last visit in March, he has been doing well, denies any seizures, denies any side effect from the medications.  He still have balance issue and had fallen in the month of July.  He reported he bumped his head very slightly.  3 days later he started getting worse, having confusion and was taken to the ED. His head CT did not show any bleeding but his sodium was noted to be 122.  He does have chronic hyponatremia, he was seen by nephrology in the hospital.  He was told that hyponatremia was secondary to cerebral salt wasting.  He was treated and on discharge his sodium level was back to normal at 136.  In the hospital his antiseizure medications level were checked also and there were all within normal limits.  Since discharge from the hospital, he continues to do well.  He reported he is back to his normal self now.     Interval  history 09/13/2021:  Patient presents today for follow-up, she is accompanied by his wife.  Last visit with Dr. Jannifer Franklin was in September, at that time plan was to continue current medications.  He is known to have breakthrough seizure, and wife report that the last breakthrough seizure was 6 weeks ago.  He does have Ativan currently at the facility and usually gets it as soon as the seizure started because he is known to go into status per wife.  Ativan use to stop the seizures   History of present illness: Cameron Potts is an 87 year old right-handed white male with a history of intractable seizures.  He currently is on Dilantin, Vimpat, and Keppra and he continues to have breakthrough seizures.  He last had a brief event that occurred 2 weeks ago.  He had a fall associated with this.  He had a another seizure about a month prior to this.  He has Ativan that he receives as needed at his extended care facility.  His most recent Dilantin level on 12 March 2021 was 18.0.  The patient is also on Coumadin.  He walks with a walker.  He is getting physical therapy on a regular basis.  He has had a general decline in his balance according to his wife and according to the patient.  He has a vagal nerve stimulator in place, this was inserted in 2013.  He tolerates this fairly well.   Past Medical History:  Diagnosis Date   Abnormality of gait 12/21/2013   Atrial fibrillation (HCC)    Blind left eye    Carcinoma in situ of prostate    Cardiac arrest ([place])    Glaucoma    Hypercholesteremia    Hyperlipidemia    Metabolic encephalopathy    Mild left ventricular systolic dysfunction    Presence of other specified functional implants    PSA elevation    Right frontal lobe lesion    Seizures (HCC)    Tinea pedis    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grover)    Urinary urgency     Past Surgical History:  Procedure Laterality Date   CARDIAC DEFIBRILLATOR PLACEMENT     cataract surgery     COLONOSCOPY      CRANIOTOMY Right    right anterior temporal resection   defibrillator replaced  March 2016   IMPLANTATION VAGAL NERVE STIMULATOR  June 2016   NASAL SINUS SURGERY     TONSILLECTOMY     age 6   VASECTOMY  22    Family History  Problem Relation Age of Onset   Cancer - Lung Mother    Cancer - Prostate Father     Social history:  reports that he has never smoked. He has never used smokeless tobacco. He reports that he does not currently use alcohol. He reports that he does not use drugs.    Allergies  Allergen Reactions   Depakote [Divalproex Sodium] Other (See Comments)    Arthralgias     Medications:  Current Meds  Medication Sig   acetaminophen (TYLENOL) 325 MG tablet Take 650 mg by mouth every 6 (six) hours as needed for mild pain or moderate pain.   ALPRAZolam (XANAX) 0.25 MG tablet Take 0.25 mg by mouth 2 (two) times daily as needed for anxiety.   brimonidine-timolol (COMBIGAN) 0.2-0.5 % ophthalmic solution Place 1 drop into both eyes every 12 (twelve) hours.    calcium carbonate (TUMS SMOOTHIES) 750 MG chewable tablet Chew 2 tablets by mouth 4 (four) times daily as needed for heartburn.   camphor-menthol (SARNA) lotion Apply 1 Application topically every 8 (eight) hours as needed for itching.   cetirizine (ZYRTEC) 10 MG tablet Take 10 mg by mouth as needed for allergies.   cholecalciferol (VITAMIN D) 400 units TABS tablet Take 400 Units by mouth 2 (two) times daily.   clobetasol cream (TEMOVATE) [id number] % Apply 1 application  topically 2 (two) times daily as needed (rash).   DILANTIN 100 MG ER capsule Take 300 mg by mouth. In the evening   econazole nitrate 1 % cream Apply 1 Application topically 2 (two) times daily as needed.   finasteride (PROSCAR) 5 MG tablet Take 5 mg by mouth every morning.   flecainide (TAMBOCOR) 50 MG tablet Take 50 mg by mouth 2 (two) times daily.   hydrALAZINE (APRESOLINE) 10 MG tablet Take 1 tablet (10 mg total) by mouth 2 (two) times daily as  needed.   lacosamide (VIMPAT) 200 MG TABS tablet Take 200 mg by mouth 2 (two) times daily.   latanoprost (XALATAN) 0.005 % ophthalmic solution Place 1 drop into both eyes at bedtime.   Levetiracetam 750 MG TB24 Take 2,250 mg by mouth daily.   LORazepam (ATIVAN) 2 MG/ML concentrated solution Take 0.5 mg by mouth as needed for anxiety.   miconazole (MICOTIN) 2 % powder Apply 1 Application topically 2 (two) times daily.   phenytoin (  DILANTIN) 100 MG ER capsule Take 300 mg by mouth every evening.   phenytoin (DILANTIN) 50 MG tablet Chew 75 mg by mouth daily.   polyethylene glycol (MIRALAX / GLYCOLAX) 17 g packet Take 17 g by mouth as needed.   polyethylene glycol (MIRALAX / GLYCOLAX) packet Take 17 g by mouth every other day.   sodium chloride 1 g tablet Take 2 g by mouth 3 (three) times daily with meals.   tamsulosin (FLOMAX) 0.4 MG CAPS capsule Take 0.4 mg by mouth daily.   triamcinolone cream (KENALOG) 0.1 % Apply 1 Application topically 2 (two) times daily.   triamcinolone cream (KENALOG) 0.1 % Apply 1 Application topically as needed.      ROS: Out of a complete 14 system review of symptoms, the patient complains only of the following symptoms, and all other reviewed systems are negative.  Walking difficulty Seizures  Blood pressure 119/64, pulse 73, height 5\' 9"  (1.753 m), weight 175 lb (79.4 kg).   Physical Exam  General: The patient is alert and cooperative at the time of the examination.  Skin: No significant peripheral edema is noted.   Neurologic Exam  Mental status: The patient is alert and oriented x 3 at the time of the examination. The patient has apparent normal recent and remote memory, with an apparently normal attention span and concentration ability.  Cranial nerves: Facial symmetry is present. Speech is normal, no aphasia or dysarthria is noted. Extraocular movements are full. Visual fields are full. Decrease facial movements.   Motor: The patient has good  strength in all 4 extremities.  Sensory examination: Soft touch sensation is symmetric on the face, arms, and legs.  Coordination: The patient has good finger-nose-finger and heel-to-shin bilaterally.  Gait and station: The patient has a somewhat wide-based gait, the patient walks with a walker.  With the walker, he has good stride and turns, good stability.  Tandem gait was not attempted.  Romberg is positive, he tends to fall backwards.  Reflexes: Deep tendon reflexes are symmetric.   Assessment/Plan:  1.  Intractable seizures s/p VNS placement  2.  Gait disorder  He is stable on VNS and 3 antiepileptic medications.  I interrogated his VNS and made changes. He tolerated the procedure well. Continue with current medications including Vimpat 200 mg BID, Keppra XR 2250 mg by mouth daily and Dilantin 75 mg morning and 300 mg PM. He continues to get physical therapy at the facility.  I will see him in 6 months or sooner if worse    Patient Instructions  Continue with Keppra 2250 mg daily Continue with Dilantin 75 mg in the morning and 300 mg in the evening Continue with Vimpat 200 mg twice daily VNS settings changed today patient tolerated procedure well Follow-up in 6 months or sooner if worse.    Alric Ran, MD 09/03/2022 3:20 PM  [facility] [address] [place], [place] [address]  Phone [phone] Fax [phone]

## 2022-09-06 ENCOUNTER — Encounter: Payer: Self-pay | Admitting: Adult Health

## 2022-09-06 ENCOUNTER — Non-Acute Institutional Stay: Payer: Medicare Other | Admitting: Adult Health

## 2022-09-06 DIAGNOSIS — E871 Hypo-osmolality and hyponatremia: Secondary | ICD-10-CM

## 2022-09-06 DIAGNOSIS — R600 Localized edema: Secondary | ICD-10-CM

## 2022-09-06 DIAGNOSIS — R531 Weakness: Secondary | ICD-10-CM | POA: Diagnosis not present

## 2022-09-06 DIAGNOSIS — Z79899 Other long term (current) drug therapy: Secondary | ICD-10-CM | POA: Diagnosis not present

## 2022-09-06 NOTE — Progress Notes (Addendum)
Location:  [facility] Room Number: [id number] Place of Service:  ALF [phone]) Provider:  Earney Hamburg, MD  Patient Care Team: [name], MD as PCP - General (Internal Medicine) Sanda Klein, MD as PCP - Cardiology (Cardiology) Rutherford Guys, MD as Consulting Physician (Ophthalmology) Kathrynn Ducking, MD (Inactive) as Consulting Physician (Neurology) Gayland Curry, DO (Geriatric Medicine)  Extended Emergency Contact Information Primary Emergency Contact: Skidgel,[name] Address: [address] [place]          [place] [address] Johnnette Litter of [place] Phone: [phone] Mobile Phone: [phone] Relation: Spouse Secondary Emergency Contact: [place] Phone: [phone] Mobile Phone: [phone] Relation: Son  Code Status:  DNR Goals of care: Advanced Directive information    08/30/2022   12:18 PM  Advanced Directives  Does Patient Have a Medical Advance Directive? Yes  Type of Advance Directive Living will;Out of facility DNR (pink MOST or yellow form)  Copy of Outlook in Chart? Yes - validated most recent copy scanned in chart (See row information)     Chief Complaint  Patient presents with   Acute Visit    Patient is being seen for Swelling    Immunizations    Discussed the need for shingles vaccine [facility] verified   Quality Metric Gaps    Discussed the need for [name]    HPI:  Pt is a 87 y.o. male seen today for an acute visit for weakness, swelling.  PMH: afib off coumadin due to falls, seizures, right temporal lobe resection, vagus nerve stimulator, SIADH, HTN, Grover's disease, Cardiac arrest with defibrillator, HLD, and BPH  Resident reports having no energy and feeling weaker the past couple of days. His legs are swelling more now as well making it harder for him to ambulate.  BP ranges 120-150/60-80 Denies any cough, sore throat, sob.  Weight: 174 lb 12.8 oz  (79.3 kg)  Gained 3 lbs in the past week but weight has been variable.  Current on Na tablets 2 grams TID for SIADH. Last NA 130 on 08/30/22 Not compliant with fluid restriction per nurse.  Not agreeable to compression hose.  Needs BMP checked and wants phenytoin level checked too, last one was low at 8.1. No seizures reported this week. Felt bad yesterday but no seizure per his report.  Denies any dysuria or bladder pain. Slow stream.   Past Medical History:  Diagnosis Date   Abnormality of gait 12/21/2013   Atrial fibrillation (HCC)    Blind left eye    Carcinoma in situ of prostate    Cardiac arrest ([place])    Glaucoma    Hypercholesteremia    Hyperlipidemia    Metabolic encephalopathy    Mild left ventricular systolic dysfunction    Presence of other specified functional implants    PSA elevation    Right frontal lobe lesion    Seizures (HCC)    Tinea pedis    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grover)    Urinary urgency    Past Surgical History:  Procedure Laterality Date   CARDIAC DEFIBRILLATOR PLACEMENT     cataract surgery     COLONOSCOPY     CRANIOTOMY Right    right anterior temporal resection   defibrillator replaced  March 2016   IMPLANTATION VAGAL NERVE STIMULATOR  June 2016   NASAL SINUS SURGERY     TONSILLECTOMY     age [place]  Allergies  Allergen Reactions   Depakote [Divalproex Sodium] Other (See Comments)    Arthralgias     Outpatient Encounter Medications as of 09/06/2022  Medication Sig   acetaminophen (TYLENOL) 325 MG tablet Take 650 mg by mouth every 6 (six) hours as needed for mild pain or moderate pain.   ALPRAZolam (XANAX) 0.25 MG tablet Take 0.25 mg by mouth 2 (two) times daily as needed for anxiety.   brimonidine-timolol (COMBIGAN) 0.2-0.5 % ophthalmic solution Place 1 drop into both eyes every 12 (twelve) hours.    calcium carbonate (TUMS SMOOTHIES) 750 MG chewable tablet Chew 2 tablets by mouth 4 (four) times daily as needed  for heartburn.   camphor-menthol (SARNA) lotion Apply 1 Application topically every 8 (eight) hours as needed for itching.   cetirizine (ZYRTEC) 10 MG tablet Take 10 mg by mouth as needed for allergies.   cholecalciferol (VITAMIN D) 400 units TABS tablet Take 400 Units by mouth 2 (two) times daily.   clobetasol cream (TEMOVATE) [id number] % Apply 1 application  topically 2 (two) times daily as needed (rash).   DILANTIN 100 MG ER capsule Take 300 mg by mouth. In the evening   econazole nitrate 1 % cream Apply 1 Application topically 2 (two) times daily as needed.   finasteride (PROSCAR) 5 MG tablet Take 5 mg by mouth every morning.   flecainide (TAMBOCOR) 50 MG tablet Take 50 mg by mouth 2 (two) times daily.   hydrALAZINE (APRESOLINE) 10 MG tablet Take 1 tablet (10 mg total) by mouth 2 (two) times daily as needed.   lacosamide (VIMPAT) 200 MG TABS tablet Take 200 mg by mouth 2 (two) times daily.   latanoprost (XALATAN) 0.005 % ophthalmic solution Place 1 drop into both eyes at bedtime.   Levetiracetam 750 MG TB24 Take 2,250 mg by mouth daily.   LORazepam (ATIVAN) 2 MG/ML concentrated solution Take 0.5 mg by mouth as needed for anxiety.   phenytoin (DILANTIN) 50 MG tablet Chew 75 mg by mouth daily.   polyethylene glycol (MIRALAX / GLYCOLAX) 17 g packet Take 17 g by mouth as needed.   polyethylene glycol (MIRALAX / GLYCOLAX) packet Take 17 g by mouth every other day.   sodium chloride 1 g tablet Take 2 g by mouth 3 (three) times daily with meals.   tamsulosin (FLOMAX) 0.4 MG CAPS capsule Take 0.4 mg by mouth daily.   triamcinolone cream (KENALOG) 0.1 % Apply 1 Application topically 2 (two) times daily.   triamcinolone cream (KENALOG) 0.1 % Apply 1 Application topically as needed.   miconazole (MICOTIN) 2 % powder Apply 1 Application topically 2 (two) times daily. (Patient not taking: Reported on 09/06/2022)   phenytoin (DILANTIN) 100 MG ER capsule Take 300 mg by mouth every evening. (Patient not taking:  Reported on 09/06/2022)   No facility-administered encounter medications on file as of 09/06/2022.    Review of Systems  Constitutional:  Positive for activity change and fatigue. Negative for appetite change, chills, diaphoresis, fever and unexpected weight change.  Respiratory:  Negative for cough, shortness of breath, wheezing and stridor.   Cardiovascular:  Positive for leg swelling. Negative for chest pain and palpitations.  Gastrointestinal:  Negative for abdominal distention, abdominal pain, constipation and diarrhea.  Genitourinary:  Negative for difficulty urinating and dysuria.  Musculoskeletal:  Positive for gait problem. Negative for arthralgias, back pain, joint swelling and myalgias.  Neurological:  Positive for seizures (h/o) and weakness. Negative for dizziness, syncope, facial asymmetry, speech difficulty and headaches.  Hematological:  Negative for adenopathy. Does not bruise/bleed easily.  Psychiatric/Behavioral:  Positive for confusion. Negative for agitation and behavioral problems.     Immunization History  Administered Date(s) Administered   Fluad Quad(high Dose 65+) 03/22/2022   Influenza, High Dose Seasonal PF 04/07/2020   Influenza, Seasonal, Injecte, Preservative Fre 02/22/2010   Influenza,inj,Quad PF,6+ Mos 04/07/2018, 04/15/2019   Influenza-Unspecified 03/17/2013, 02/24/2014, 02/16/2015, 03/21/2016, 03/17/2017, 04/07/2018, 03/21/2021   Moderna SARS-COV2 Booster Vaccination 01/11/2021, 03/28/2021   Moderna Sars-Covid-2 Vaccination 06/28/2019, 07/27/2019, 05/01/2020, 04/22/2022   Pneumococcal Conjugate-13 07/05/2013   Pneumococcal Polysaccharide-23 06/17/2006   Td 06/18/2007   Tdap 12/07/2015, 09/17/2020   Zoster Recombinat (Shingrix) 12/04/2016   Zoster, Live 10/06/2008   Pertinent  Health Maintenance Due  Topic Date Due   INFLUENZA VACCINE  Completed      01/26/2022    7:59 PM 01/27/2022    8:00 AM 06/18/2022   10:38 AM 07/08/2022    9:52 AM 07/08/2022     3:03 PM  Fall Risk  Falls in the past year?   0 1 1  Was there an injury with Fall?   0 0 0  Fall Risk Category Calculator   0 1 1  Fall Risk Category (Retired)   Low    (RETIRED) Patient Fall Risk Level High fall risk High fall risk High fall risk    Patient at Risk for Falls Due to   History of fall(s) History of fall(s) History of fall(s);Impaired balance/gait;Impaired mobility  Fall risk Follow up   Falls evaluation completed Falls evaluation completed Falls evaluation completed   Functional Status Survey:    Vitals:   09/06/22 1002  BP: (!) 142/82  Pulse: 60  Resp: 18  Temp: 98 F (36.7 C)  TempSrc: Temporal  SpO2: 97%  Weight: 174 lb 12.8 oz (79.3 kg)  Height: 5\' 9"  (1.753 m)   Body mass index is 25.81 kg/m. Physical Exam Vitals and nursing note reviewed.  Constitutional:      General: He is not in acute distress.    Appearance: He is not diaphoretic.  HENT:     Head: Normocephalic and atraumatic.  Eyes:     Conjunctiva/sclera: Conjunctivae normal.     Pupils: Pupils are equal, round, and reactive to light.  Neck:     Thyroid: No thyromegaly.     Vascular: No JVD.     Trachea: No tracheal deviation.  Cardiovascular:     Rate and Rhythm: Normal rate. Rhythm irregular.     Heart sounds: No murmur heard. Pulmonary:     Effort: Pulmonary effort is normal. No respiratory distress.     Breath sounds: Normal breath sounds. No wheezing.  Abdominal:     General: Bowel sounds are normal. There is no distension.     Palpations: Abdomen is soft.     Tenderness: There is no abdominal tenderness.  Musculoskeletal:     Right lower leg: Edema (3+) present.     Left lower leg: Edema (2+) present.     Comments: No calf tenderness or redness.   Lymphadenopathy:     Cervical: No cervical adenopathy.  Skin:    General: Skin is warm and dry.     Findings: Bruising (BUE) present.  Neurological:     General: No focal deficit present.     Mental Status: He is alert.  Mental status is at baseline.  Psychiatric:        Mood and Affect: Mood normal.     Labs reviewed: Recent  Labs    01/27/22 0333 02/01/22 0000 08/24/22 1447 08/26/22 0000 08/28/22 1345  NA 131*   < > 125* 126* 128*  K 4.7   < > 4.3 4.7 4.2  CL 104   < > 92* 95* 97*  CO2 17*   < > 25 17 25   GLUCOSE 108*  --  102*  --  98  BUN 17   < > 14 11 10   CREATININE 0.76   < > 0.73 0.6 0.62  CALCIUM 7.6*   < > 9.1 8.8 8.9  MG  --   --  1.7  --   --    < > = values in this interval not displayed.   Recent Labs    08/24/22 1447 08/28/22 1345  AST 27 20  ALT 26 16  ALKPHOS 116 116  BILITOT 0.5 0.6  PROT 7.3 6.9  ALBUMIN 4.3 4.0   Recent Labs    01/21/22 0844 02/01/22 0000 06/24/22 0000 08/24/22 1447 08/28/22 1345  WBC 4.3   < > 7.3 4.6 5.1  NEUTROABS  --   --  4.00 1.9 2.2  HGB 11.6*   < > 12.4* 12.0* 11.0*  HCT 31.5*   < > 37* 32.3* 29.9*  MCV 88.5  --   --  88.7 89.5  PLT 236   < > 257 219 212   < > = values in this interval not displayed.   Lab Results  Component Value Date   TSH 2.685 01/19/2022   No results found for: "HGBA1C" Lab Results  Component Value Date   CHOL 214 (A) 06/24/2022   HDL 83 (A) 06/24/2022   LDLCALC 141 05/15/2021   TRIG 59 06/24/2022    Significant Diagnostic Results in last 30 days:  Vagal Nerve Stimulation  Result Date: 09/03/2022 Alric Ran, MD     09/03/2022  3:22 PM   CT HEAD WO CONTRAST (5MM)  Result Date: 08/24/2022 CLINICAL DATA:  Head trauma.  Fall 2-3 days ago with head strike. EXAM: CT HEAD WITHOUT CONTRAST TECHNIQUE: Contiguous axial images were obtained from the base of the skull through the vertex without intravenous contrast. RADIATION DOSE REDUCTION: This exam was performed according to the departmental dose-optimization program which includes automated exposure control, adjustment of the mA and/or kV according to patient size and/or use of iterative reconstruction technique. COMPARISON:  Head CT 01/14/2022. FINDINGS:  Brain: Unchanged postoperative appearance from prior right anterior temporal lobectomy. Unchanged encephalomalacia along the right anterior cingulate gyrus. Gray-white differentiation is otherwise preserved no acute intracranial hemorrhage. No hydrocephalus or extra-axial collection. Vascular: No hyperdense vessel or unexpected calcification. Skull: Prior right frontotemporal craniotomy. Sinuses/Orbits: Mild mucosal disease throughout the paranasal sinuses. Trace opacification of the left mastoid. Orbits are unremarkable. Other: None. IMPRESSION: 1. No acute intracranial abnormality. 2. Unchanged postoperative appearance from prior right anterior temporal lobectomy. Unchanged encephalomalacia along the right anterior cingulate gyrus. Electronically Signed   By: Emmit Alexanders M.D.   On: 08/24/2022 15:48    Assessment/Plan  1. Localized edema Likely due to high dose sodium Will recheck NA if acceptable can reduce sodium tablets BP ok No sob Consider lasix but holding off for now due to hx of orthostatic hypotension Refused ted hose in the past.   2. Weakness ?due to hyponatremia.  Otherwise no acute signs of infection   3. Hyponatremia Last NA 130 08/30/22 Has SIADH hx Difficult to control Not compliant with restricting fluids, has hx of orthostatic hypotension Will recheck BMP  Family/ staff Communication: nurse Change to 6 weeks since he just saw me and prn if worsening  Labs/tests ordered:  BMP phenytoin stat. D/c scheduled lab draw.

## 2022-09-09 ENCOUNTER — Telehealth: Payer: Self-pay | Admitting: Neurology

## 2022-09-09 NOTE — Telephone Encounter (Signed)
Tabatha called from [facility]. Stated she needs to talk to nurse because pt Dilantin level is a 13.0 and Dr. Jannifer Franklin wanted it to be a 34. Stated pt is confused and has fell 3 times in last week.

## 2022-09-09 NOTE — Telephone Encounter (Signed)
Returned call to facility and spoke to [place], she stated that the pt took a fall yesterday and has been seeming extra confused. Dilantin level is a 13.0 and Dr. Jannifer Franklin (retired)wanted it to be a 109. They wanted to know if the provider had any recommendations.

## 2022-09-09 NOTE — Telephone Encounter (Signed)
Please do not increase the Dilantin. Please have patient work with PT for gait training.

## 2022-09-10 DIAGNOSIS — E871 Hypo-osmolality and hyponatremia: Secondary | ICD-10-CM | POA: Diagnosis not present

## 2022-09-10 DIAGNOSIS — Z7901 Long term (current) use of anticoagulants: Secondary | ICD-10-CM | POA: Diagnosis not present

## 2022-09-10 NOTE — Telephone Encounter (Signed)
Returned call to tabitha and relayed Dr. Jabier Mutton recommendations for pt and gait training. Pt nurse tabitha voiced gratitude and understanding

## 2022-09-12 DIAGNOSIS — L570 Actinic keratosis: Secondary | ICD-10-CM | POA: Diagnosis not present

## 2022-09-12 DIAGNOSIS — L853 Xerosis cutis: Secondary | ICD-10-CM | POA: Diagnosis not present

## 2022-09-12 DIAGNOSIS — L309 Dermatitis, unspecified: Secondary | ICD-10-CM | POA: Diagnosis not present

## 2022-09-12 DIAGNOSIS — Z85828 Personal history of other malignant neoplasm of skin: Secondary | ICD-10-CM | POA: Diagnosis not present

## 2022-09-12 DIAGNOSIS — I872 Venous insufficiency (chronic) (peripheral): Secondary | ICD-10-CM | POA: Diagnosis not present

## 2022-09-12 DIAGNOSIS — I8311 Varicose veins of right lower extremity with inflammation: Secondary | ICD-10-CM | POA: Diagnosis not present

## 2022-09-12 DIAGNOSIS — L111 Transient acantholytic dermatosis [Grover]: Secondary | ICD-10-CM | POA: Diagnosis not present

## 2022-09-12 DIAGNOSIS — I8312 Varicose veins of left lower extremity with inflammation: Secondary | ICD-10-CM | POA: Diagnosis not present

## 2022-09-16 ENCOUNTER — Other Ambulatory Visit: Payer: Self-pay | Admitting: Internal Medicine

## 2022-09-16 MED ORDER — ALPRAZOLAM 0.25 MG PO TABS: 0.2500 mg | ORAL_TABLET | Freq: Two times a day (BID) | ORAL | 0 refills | Status: DC | PRN

## 2022-09-17 ENCOUNTER — Non-Acute Institutional Stay: Payer: Medicare Other | Admitting: Internal Medicine

## 2022-09-17 ENCOUNTER — Encounter: Payer: Self-pay | Admitting: Internal Medicine

## 2022-09-17 VITALS — BP 138/84 | HR 69 | Temp 97.6°F | Resp 17 | Ht 69.0 in

## 2022-09-17 DIAGNOSIS — R600 Localized edema: Secondary | ICD-10-CM

## 2022-09-17 DIAGNOSIS — G3184 Mild cognitive impairment, so stated: Secondary | ICD-10-CM

## 2022-09-17 DIAGNOSIS — R2689 Other abnormalities of gait and mobility: Secondary | ICD-10-CM | POA: Diagnosis not present

## 2022-09-17 DIAGNOSIS — R350 Frequency of micturition: Secondary | ICD-10-CM

## 2022-09-17 DIAGNOSIS — E871 Hypo-osmolality and hyponatremia: Secondary | ICD-10-CM | POA: Diagnosis not present

## 2022-09-17 DIAGNOSIS — N401 Enlarged prostate with lower urinary tract symptoms: Secondary | ICD-10-CM

## 2022-09-17 DIAGNOSIS — R296 Repeated falls: Secondary | ICD-10-CM

## 2022-09-17 DIAGNOSIS — G20A1 Parkinson's disease without dyskinesia, without mention of fluctuations: Secondary | ICD-10-CM | POA: Diagnosis not present

## 2022-09-17 DIAGNOSIS — E782 Mixed hyperlipidemia: Secondary | ICD-10-CM

## 2022-09-17 DIAGNOSIS — I10 Essential (primary) hypertension: Secondary | ICD-10-CM | POA: Diagnosis not present

## 2022-09-17 DIAGNOSIS — G40909 Epilepsy, unspecified, not intractable, without status epilepticus: Secondary | ICD-10-CM

## 2022-09-17 DIAGNOSIS — R21 Rash and other nonspecific skin eruption: Secondary | ICD-10-CM

## 2022-09-18 DIAGNOSIS — R2689 Other abnormalities of gait and mobility: Secondary | ICD-10-CM | POA: Diagnosis not present

## 2022-09-18 DIAGNOSIS — G20A1 Parkinson's disease without dyskinesia, without mention of fluctuations: Secondary | ICD-10-CM | POA: Diagnosis not present

## 2022-09-18 DIAGNOSIS — R296 Repeated falls: Secondary | ICD-10-CM | POA: Diagnosis not present

## 2022-09-18 NOTE — Progress Notes (Addendum)
Location:  [facility] features editor of Service:  Clinic (12)  Provider:   Code Status: DNR Goals of Care:     09/17/2022    3:13 PM  Advanced Directives  Does Patient Have a Medical Advance Directive? Yes  Type of Advance Directive Living will;Out of facility DNR (pink MOST or yellow form);Healthcare Power of Attorney  Does patient want to make changes to medical advance directive? No - Patient declined  Copy of Healthcare Power of Attorney in Chart? No - copy requested     Chief Complaint  Patient presents with   Medical Management of Chronic Issues    Patient is here for a follow up from rehab. Patient states he has been having a rash would like to discuss dermatology     HPI: Patient is a 87 y.o. male seen today for medical management of chronic diseases.   [place] in [place] in [place]  Recurrent falls Continues to be his problem.  He fell again on 3/23.  Has bruise on his left cheek. He is working with therapy now supposed to be using walker all the time. Hyponatremia Last time was triggered by his fall/possible concussion.  He has SIADH.  On high dose of sodium tablets right now.  His last sodium was 136 Rash He has a diagnosis of Grovers disease.  Was seen by dermatology this time and was told that there is some soap he needs to use.  He continues to need triamcinolone which is helping his rash. Cognitive impairment Needs a lot of help in the a.m.Can walk with his walker but forgets to use it [employer] very easily  Bilateral lower extremity edema and hypertension most likely due to sodium tablets.  It is slightly better since the dose was reduced  Patient also has a history of refractive seizure disorder for 30 years Is on multiple medications follow closely with neurology had seen a number of specialist has had surgery done and now also has a vagal stimulator.  Also gets as needed lV valium .  His seizure are complex including hallucinations.  He states  that he knows they are coming.     History of chronic A. fib s/p ICD defibrillator Not on Coumadin anymore due to his multiple falls and Seizures  Past Medical History:  Diagnosis Date   Abnormality of gait 12/21/2013   Atrial fibrillation    Blind left eye    Carcinoma in situ of prostate    Cardiac arrest    Glaucoma    Hypercholesteremia    Hyperlipidemia    Metabolic encephalopathy    Mild left ventricular systolic dysfunction    Presence of other specified functional implants    PSA elevation    Right frontal lobe lesion    Seizures    Tinea pedis    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grover)    Urinary urgency     Past Surgical History:  Procedure Laterality Date   CARDIAC DEFIBRILLATOR PLACEMENT     cataract surgery     COLONOSCOPY     CRANIOTOMY Right    right anterior temporal resection   defibrillator replaced  March 2016   IMPLANTATION VAGAL NERVE STIMULATOR  June 2016   NASAL SINUS SURGERY     TONSILLECTOMY     age 89   VASECTOMY  25    Allergies  Allergen Reactions   Depakote [Divalproex Sodium] Other (See Comments)    Arthralgias     Outpatient Encounter Medications as  of 09/17/2022  Medication Sig   acetaminophen (TYLENOL) 325 MG tablet Take 650 mg by mouth every 6 (six) hours as needed for mild pain or moderate pain.   ALPRAZolam (XANAX) 0.25 MG tablet Take 1 tablet (0.25 mg total) by mouth 2 (two) times daily as needed for anxiety.   brimonidine-timolol (COMBIGAN) 0.2-0.5 % ophthalmic solution Place 1 drop into both eyes every 12 (twelve) hours.    calcium carbonate (TUMS SMOOTHIES) 750 MG chewable tablet Chew 2 tablets by mouth 4 (four) times daily as needed for heartburn.   camphor-menthol (SARNA) lotion Apply 1 Application topically every 8 (eight) hours as needed for itching.   cetirizine (ZYRTEC) 10 MG tablet Take 10 mg by mouth as needed for allergies.   cholecalciferol (VITAMIN D) 400 units TABS tablet Take 400 Units by mouth 2 (two) times  daily.   clobetasol cream (TEMOVATE) 0.05 % Apply 1 application  topically 2 (two) times daily as needed (rash).   DILANTIN 100 MG ER capsule Take 300 mg by mouth. In the evening   econazole nitrate 1 % cream Apply 1 Application topically 2 (two) times daily as needed.   finasteride (PROSCAR) 5 MG tablet Take 5 mg by mouth every morning.   flecainide (TAMBOCOR) 50 MG tablet Take 50 mg by mouth 2 (two) times daily.   hydrALAZINE (APRESOLINE) 10 MG tablet Take 1 tablet (10 mg total) by mouth 2 (two) times daily as needed.   lacosamide (VIMPAT) 200 MG TABS tablet Take 200 mg by mouth 2 (two) times daily.   latanoprost (XALATAN) 0.005 % ophthalmic solution Place 1 drop into both eyes at bedtime.   Levetiracetam 750 MG TB24 Take 2,250 mg by mouth daily.   LORazepam (ATIVAN) 2 MG/ML concentrated solution Take 0.5 mg by mouth as needed for anxiety.   miconazole (MICOTIN) 2 % powder Apply 1 Application topically 2 (two) times daily.   phenytoin (DILANTIN) 100 MG ER capsule Take 300 mg by mouth every evening.   phenytoin (DILANTIN) 50 MG tablet Chew 75 mg by mouth daily.   polyethylene glycol (MIRALAX / GLYCOLAX) 17 g packet Take 17 g by mouth as needed.   polyethylene glycol (MIRALAX / GLYCOLAX) packet Take 17 g by mouth every other day.   sodium chloride 1 g tablet Take 1 g by mouth 2 (two) times daily.   tamsulosin (FLOMAX) 0.4 MG CAPS capsule Take 0.4 mg by mouth daily.   triamcinolone cream (KENALOG) 0.1 % Apply 1 Application topically 2 (two) times daily.   triamcinolone cream (KENALOG) 0.1 % Apply 1 Application topically as needed.   No facility-administered encounter medications on file as of 09/17/2022.    Review of Systems:  Review of Systems  Constitutional:  Negative for activity change, appetite change and unexpected weight change.  HENT: Negative.    Respiratory:  Negative for cough and shortness of breath.   Gastrointestinal:  Negative for constipation.  Genitourinary:  Negative for  frequency.  Musculoskeletal:  Positive for gait problem. Negative for arthralgias and myalgias.  Skin:  Positive for rash.  Neurological:  Negative for dizziness and weakness.  Psychiatric/Behavioral:  Positive for confusion. Negative for sleep disturbance.   All other systems reviewed and are negative.   Health Maintenance  Topic Date Due   Medicare Annual Wellness ([name])  10/02/2022 (Originally 06/14/2020)   COVID-19 Vaccine (5 - 2023-24 season) 10/03/2022 (Originally 06/17/2022)   Zoster Vaccines- Shingrix (2 of 2) 01/16/2023 (Originally 01/29/2017)   INFLUENZA VACCINE  01/16/2023  DTaP/Tdap/Td (4 - Td or Tdap) 09/18/2030   Pneumonia Vaccine 73+ Years old  Completed   HPV VACCINES  Aged Out    Physical Exam: Vitals:   09/17/22 1510  BP: 138/84  Pulse: 69  Resp: 17  Temp: 97.6 F (36.4 C)  TempSrc: Temporal  SpO2: 98%  Height: 5\' 9"  (1.753 m)   Body mass index is 25.81 kg/m. Physical Exam Vitals reviewed.  Constitutional:      Appearance: Normal appearance.  HENT:     Head: Normocephalic.     Nose: Nose normal.     Mouth/Throat:     Mouth: Mucous membranes are moist.     Pharynx: Oropharynx is clear.  Eyes:     Pupils: Pupils are equal, round, and reactive to light.  Cardiovascular:     Rate and Rhythm: Normal rate and regular rhythm.     Pulses: Normal pulses.     Heart sounds: No murmur heard. Pulmonary:     Effort: Pulmonary effort is normal. No respiratory distress.     Breath sounds: Normal breath sounds. No rales.  Abdominal:     General: Abdomen is flat. Bowel sounds are normal.     Palpations: Abdomen is soft.  Musculoskeletal:        General: Swelling present.     Cervical back: Neck supple.  Skin:    General: Skin is warm.     Comments: Rash Much Better Still has small area in the back   Neurological:     General: No focal deficit present.     Mental Status: He is alert.  Psychiatric:        Mood and Affect: Mood normal.        Thought  Content: Thought content normal.     Labs reviewed: Basic Metabolic Panel: Recent Labs    01/19/22 1658 01/19/22 2206 01/27/22 0333 02/01/22 0000 08/24/22 1447 08/26/22 0000 08/28/22 1345 08/30/22 0000  NA 123*   < > 131*   < > 125* 126* 128* 130*  K 3.8   < > 4.7   < > 4.3 4.7 4.2  --   CL 91*   < > 104   < > 92* 95* 97* 99  CO2 21*   < > 17*   < > 25 17 25 21   GLUCOSE 131*   < > 108*  --  102*  --  98  --   BUN 7*   < > 17   < > 14 11 10 8   CREATININE 0.62   < > 0.76   < > 0.73 0.6 0.62 0.6  CALCIUM 8.5*   < > 7.6*   < > 9.1 8.8 8.9 8.5*  MG  --   --   --   --  1.7  --   --   --   TSH 2.685  --   --   --   --   --   --   --    < > = values in this interval not displayed.   Liver Function Tests: Recent Labs    08/24/22 1447 08/28/22 1345  AST 27 20  ALT 26 16  ALKPHOS 116 116  BILITOT 0.5 0.6  PROT 7.3 6.9  ALBUMIN 4.3 4.0   No results for input(s): "LIPASE", "AMYLASE" in the last 8760 hours. No results for input(s): "AMMONIA" in the last 8760 hours. CBC: Recent Labs    01/21/22 0844 02/01/22 0000 06/24/22 0000 08/24/22 1447 08/28/22  1345  WBC 4.3   < > 7.3 4.6 5.1  NEUTROABS  --   --  4.00 1.9 2.2  HGB 11.6*   < > 12.4* 12.0* 11.0*  HCT 31.5*   < > 37* 32.3* 29.9*  MCV 88.5  --   --  88.7 89.5  PLT 236   < > 257 219 212   < > = values in this interval not displayed.   Lipid Panel: Recent Labs    06/24/22 0000  CHOL 214*  HDL 83*  TRIG 59   No results found for: "HGBA1C"  Procedures since last visit: Vagal Nerve Stimulation  Result Date: 09/03/2022 Windell Norfolk, MD     09/03/2022  3:22 PM   CT HEAD WO CONTRAST ( )  Result Date: 08/24/2022 CLINICAL DATA:  Head trauma.  Fall 2-3 days ago with head strike. EXAM: CT HEAD WITHOUT CONTRAST TECHNIQUE: Contiguous axial images were obtained from the base of the skull through the vertex without intravenous contrast. RADIATION DOSE REDUCTION: This exam was performed according to the departmental  dose-optimization program which includes automated exposure control, adjustment of the mA and/or kV according to patient size and/or use of iterative reconstruction technique. COMPARISON:  Head CT 01/14/2022. FINDINGS: Brain: Unchanged postoperative appearance from prior right anterior temporal lobectomy. Unchanged encephalomalacia along the right anterior cingulate gyrus. Gray-white differentiation is otherwise preserved no acute intracranial hemorrhage. No hydrocephalus or extra-axial collection. Vascular: No hyperdense vessel or unexpected calcification. Skull: Prior right frontotemporal craniotomy. Sinuses/Orbits: Mild mucosal disease throughout the paranasal sinuses. Trace opacification of the left mastoid. Orbits are unremarkable. Other: None. IMPRESSION: 1. No acute intracranial abnormality. 2. Unchanged postoperative appearance from prior right anterior temporal lobectomy. Unchanged encephalomalacia along the right anterior cingulate gyrus. Electronically Signed   By: Orvan Falconer M.D.   On: 08/24/2022 15:48    Assessment/Plan 1. Edema of both lower extremities Change Salt tablets to 2 Gm in the Am and 1 gm inPM Repeat BMP in 1 week Goal is to reduce his sodium to 1gm BID Reducing Sodium tabs has helped his Edema and Hypertension  2. Hyponatremia AS Above  Last Sodium was 136  3. Recurrent falls Now working with therapy Continues to be his issue  4. Seizure disorder Follows with Neurology On Number of Meds Keprra Dilantin and Vimpat   5. Essential hypertension BP has been running high due to Sodium tabs Trying to cut back on them He has orthostatic BP and with his falls being Conservative Using Hydralazine PRN  6. Rash and nonspecific skin eruption Seen Dermatology On Triamcinolone  7. Anxiety  Uses xanax PRN 8. Benign prostatic hyperplasia with urinary frequency Flomax and Proscar  9. Mild cognitive impairment IN [place] but needs lot of Cueing and help 10 PAF Off  Coumadin due to falls On Flecianide   Labs/tests ordered:  BMP in 1 week Next appt:  10/21/2022

## 2022-09-20 DIAGNOSIS — R296 Repeated falls: Secondary | ICD-10-CM | POA: Diagnosis not present

## 2022-09-20 DIAGNOSIS — R2689 Other abnormalities of gait and mobility: Secondary | ICD-10-CM | POA: Diagnosis not present

## 2022-09-20 DIAGNOSIS — G20A1 Parkinson's disease without dyskinesia, without mention of fluctuations: Secondary | ICD-10-CM | POA: Diagnosis not present

## 2022-09-23 ENCOUNTER — Encounter: Payer: Medicare Other | Admitting: Adult Health

## 2022-09-24 DIAGNOSIS — R2689 Other abnormalities of gait and mobility: Secondary | ICD-10-CM | POA: Diagnosis not present

## 2022-09-24 DIAGNOSIS — G20A1 Parkinson's disease without dyskinesia, without mention of fluctuations: Secondary | ICD-10-CM | POA: Diagnosis not present

## 2022-09-24 DIAGNOSIS — E871 Hypo-osmolality and hyponatremia: Secondary | ICD-10-CM | POA: Diagnosis not present

## 2022-09-24 DIAGNOSIS — R296 Repeated falls: Secondary | ICD-10-CM | POA: Diagnosis not present

## 2022-09-26 ENCOUNTER — Other Ambulatory Visit: Payer: Self-pay

## 2022-09-26 ENCOUNTER — Encounter (HOSPITAL_COMMUNITY): Payer: Self-pay

## 2022-09-26 ENCOUNTER — Emergency Department (HOSPITAL_COMMUNITY): Payer: Medicare Other

## 2022-09-26 ENCOUNTER — Emergency Department (HOSPITAL_COMMUNITY)
Admission: EM | Admit: 2022-09-26 | Discharge: 2022-09-27 | Disposition: A | Discharge status: Home / Self Care | Payer: Medicare Other | Attending: Emergency Medicine | Admitting: Emergency Medicine

## 2022-09-26 DIAGNOSIS — I959 Hypotension, unspecified: Secondary | ICD-10-CM | POA: Diagnosis not present

## 2022-09-26 DIAGNOSIS — U071 COVID-19: Secondary | ICD-10-CM | POA: Diagnosis not present

## 2022-09-26 DIAGNOSIS — R4182 Altered mental status, unspecified: Secondary | ICD-10-CM | POA: Diagnosis not present

## 2022-09-26 DIAGNOSIS — F0390 Unspecified dementia without behavioral disturbance: Secondary | ICD-10-CM | POA: Insufficient documentation

## 2022-09-26 DIAGNOSIS — R0902 Hypoxemia: Secondary | ICD-10-CM | POA: Diagnosis not present

## 2022-09-26 DIAGNOSIS — G9340 Encephalopathy, unspecified: Secondary | ICD-10-CM | POA: Diagnosis not present

## 2022-09-26 DIAGNOSIS — G9349 Other encephalopathy: Secondary | ICD-10-CM

## 2022-09-26 DIAGNOSIS — R509 Fever, unspecified: Secondary | ICD-10-CM | POA: Diagnosis not present

## 2022-09-26 LAB — COMPREHENSIVE METABOLIC PANEL
ALT: 15 U/L (ref 0–44)
AST: 22 U/L (ref 15–41)
Albumin: 4 g/dL (ref 3.5–5.0)
Alkaline Phosphatase: 113 U/L (ref 38–126)
Anion gap: 7 (ref 5–15)
BUN: 15 mg/dL (ref 8–23)
CO2: 24 mmol/L (ref 22–32)
Calcium: 8.4 mg/dL — ABNORMAL LOW (ref 8.9–10.3)
Chloride: 99 mmol/L (ref 98–111)
Creatinine, Ser: 0.78 mg/dL (ref 0.61–1.24)
GFR, Estimated: >60 mL/min (ref >60)
Glucose, Bld: 120 mg/dL — ABNORMAL HIGH (ref 70–99)
Potassium: 3.9 mmol/L (ref 3.5–5.1)
Sodium: 130 mmol/L — ABNORMAL LOW (ref 135–145)
Total Bilirubin: 0.7 mg/dL (ref 0.3–1.2)
Total Protein: 7.3 g/dL (ref 6.5–8.1)

## 2022-09-26 LAB — CBC WITH DIFFERENTIAL/PLATELET
Abs Immature Granulocytes: 0.02 10*3/uL (ref 0.00–0.07)
Basophils Absolute: 0.1 10*3/uL (ref 0.0–0.1)
Basophils Relative: 1 %
Eosinophils Absolute: 0.2 10*3/uL (ref 0.0–0.5)
Eosinophils Relative: 3 %
HCT: 31.6 % — ABNORMAL LOW (ref 39.0–52.0)
Hemoglobin: 10.8 g/dL — ABNORMAL LOW (ref 13.0–17.0)
Immature Granulocytes: 0 %
Lymphocytes Relative: 15 %
Lymphs Abs: 0.9 10*3/uL (ref 0.7–4.0)
MCH: 32 pg (ref 26.0–34.0)
MCHC: 34.2 g/dL (ref 30.0–36.0)
MCV: 93.8 fL (ref 80.0–100.0)
Monocytes Absolute: 0.9 10*3/uL (ref 0.1–1.0)
Monocytes Relative: 15 %
Neutro Abs: 3.8 10*3/uL (ref 1.7–7.7)
Neutrophils Relative %: 66 %
Platelets: 207 10*3/uL (ref 150–400)
RBC: 3.37 MIL/uL — ABNORMAL LOW (ref 4.22–5.81)
RDW: 12.5 % (ref 11.5–15.5)
WBC: 5.9 10*3/uL (ref 4.0–10.5)
nRBC: 0 % (ref 0.0–0.2)

## 2022-09-26 LAB — LACTIC ACID, PLASMA: Lactic Acid, Venous: 1 mmol/L (ref 0.5–1.9)

## 2022-09-26 MED ORDER — ACETAMINOPHEN 325 MG PO TABS
650.0000 mg | ORAL_TABLET | Freq: Once | ORAL | Status: AC
  Administered 2022-09-26: 650 mg via ORAL
  Filled 2022-09-26: qty 2

## 2022-09-26 MED ORDER — SODIUM CHLORIDE 0.9 % IV BOLUS
1000.0000 mL | Freq: Once | INTRAVENOUS | Status: AC
  Administered 2022-09-26: 1000 mL via INTRAVENOUS

## 2022-09-26 NOTE — ED Triage Notes (Signed)
Pt BIB EMS from [facility] for [facility]. Pt has had three falls today.  Covid Positive  Hx of hyponatremia w/ same symptoms  No blood thinners  131/46 60 HR 95% RA 128 cbg

## 2022-09-26 NOTE — ED Provider Notes (Signed)
[facility] Provider [facility]   CSN: [id number] Arrival date & time: 09/26/22  2208     History  No chief complaint on file.   Cameron Potts is a 87 y.o. male.  HPI    This is an 87 year old male with a history of dementia who presents from [facility] with concerns for altered mental status.  Per nursing report and EMS, he is normally alert and oriented x 3.  He did test positive for COVID by report.  EMS reported increasing falls as well.  On my evaluation he is only oriented to himself.  He does not provide much information but does deny pain.  Level 5 caveat for altered mental status.   He is DNR. Home Medications Prior to Admission medications   Medication Sig Start Date End Date Taking? Authorizing Provider  acetaminophen (TYLENOL) 325 MG tablet Take 650 mg by mouth every 6 (six) hours as needed for mild pain or moderate pain.    [provider]  ALPRAZolam Prudy Feeler(XANAX) 0.25 MG tablet Take 1 tablet (0.25 mg total) by mouth 2 (two) times daily as needed for anxiety. 09/16/22   Mahlon GammonGupta, Anjali L, MD  brimonidine-timolol (COMBIGAN) 0.2-0.5 % ophthalmic solution Place 1 drop into both eyes every 12 (twelve) hours.  01/05/18   [provider]  calcium carbonate (TUMS SMOOTHIES) 750 MG chewable tablet Chew 2 tablets by mouth 4 (four) times daily as needed for heartburn.    [provider]  camphor-menthol Wynelle Fanny(SARNA) lotion Apply 1 Application topically every 8 (eight) hours as needed for itching.    [provider]  cetirizine (ZYRTEC) 10 MG tablet Take 10 mg by mouth as needed for allergies.    [provider]  cholecalciferol (VITAMIN D) 400 units TABS tablet Take 400 Units by mouth 2 (two) times daily.    [provider]  clobetasol cream (TEMOVATE) 0.05 % Apply 1 application  topically 2 (two) times daily as needed (rash).    [provider]  DILANTIN 100 MG ER capsule Take 300 mg  by mouth. In the evening 02/14/22   [provider]  econazole nitrate 1 % cream Apply 1 Application topically 2 (two) times daily as needed.    [provider]  finasteride (PROSCAR) 5 MG tablet Take 5 mg by mouth every morning. 02/12/19   [provider]  flecainide (TAMBOCOR) 50 MG tablet Take 50 mg by mouth 2 (two) times daily.    [provider]  hydrALAZINE (APRESOLINE) 10 MG tablet Take 1 tablet (10 mg total) by mouth 2 (two) times daily as needed. 06/25/22   Fletcher AnonWert, Christina, NP  lacosamide (VIMPAT) 200 MG TABS tablet Take 200 mg by mouth 2 (two) times daily.    [provider]  latanoprost (XALATAN) 0.005 % ophthalmic solution Place 1 drop into both eyes at bedtime.    [provider]  Levetiracetam 750 MG TB24 Take 2,250 mg by mouth daily.    [provider]  LORazepam (ATIVAN) 2 MG/ML concentrated solution Take 0.5 mg by mouth as needed for anxiety.    [provider]  miconazole (MICOTIN) 2 % powder Apply 1 Application topically 2 (two) times daily.    [provider]  phenytoin (DILANTIN) 100 MG ER capsule Take 300 mg by mouth every evening.    [provider]  phenytoin (DILANTIN) 50 MG tablet Chew 75 mg by mouth daily.    [provider]  polyethylene  glycol (MIRALAX / GLYCOLAX) 17 g packet Take 17 g by mouth as needed.    [provider]  polyethylene glycol (MIRALAX / GLYCOLAX) packet Take 17 g by mouth every other day.    [provider]  sodium chloride 1 g tablet Take 2 g by mouth. Take 2 gm in the AM and 1 gm in the Pm    [provider]  tamsulosin (FLOMAX) 0.4 MG CAPS capsule Take 0.4 mg by mouth daily.    [provider]  triamcinolone cream (KENALOG) 0.1 % Apply 1 Application topically 2 (two) times daily.    [provider]  triamcinolone cream (KENALOG) 0.1 % Apply 1 Application topically as needed.    [provider]       Allergies    Depakote [divalproex sodium]    Review of Systems   Review of Systems  Constitutional:  Positive for fever.  Psychiatric/Behavioral:  Positive for confusion.   All other systems reviewed and are negative.   Physical Exam Updated Vital Signs BP (!) 121/38   Pulse 60   Temp 100.1 F (37.8 C) (Rectal)   Resp 20   SpO2 95%  Physical Exam Vitals and nursing note reviewed.  Constitutional:      Appearance: He is well-developed.     Comments: Chronically ill-appearing but nontoxic  HENT:     Head: Normocephalic and atraumatic.     Mouth/Throat:     Mouth: Mucous membranes are dry.  Eyes:     Pupils: Pupils are equal, round, and reactive to light.  Cardiovascular:     Rate and Rhythm: Normal rate and regular rhythm.     Heart sounds: Normal heart sounds. No murmur heard. Pulmonary:     Effort: Pulmonary effort is normal. No respiratory distress.     Breath sounds: Normal breath sounds. No wheezing.  Abdominal:     Palpations: Abdomen is soft.     Tenderness: There is no abdominal tenderness. There is no rebound.  Musculoskeletal:     Cervical back: Neck supple.  Lymphadenopathy:     Cervical: No cervical adenopathy.  Skin:    General: Skin is warm and dry.     Comments: Wound right forearm  Neurological:     Mental Status: He is alert and oriented to person, place, and time.     Comments: Oriented only to himself  Psychiatric:        Mood and Affect: Mood normal.     Comments: Cooperative     ED Results / Procedures / Treatments   Labs (all labs ordered are listed, but only abnormal results are displayed) Labs Reviewed  RESP PANEL BY RT-PCR (RSV, FLU A&B, COVID)  [id number] - Abnormal; Notable for the following components:      Result Value   SARS Coronavirus 2 by RT PCR POSITIVE (*)    All other components within normal limits  COMPREHENSIVE METABOLIC PANEL - Abnormal; Notable for the following components:   Sodium 130 (*)    Glucose, Bld 120 (*)     Calcium 8.4 (*)    All other components within normal limits  CBC WITH DIFFERENTIAL/PLATELET - Abnormal; Notable for the following components:   RBC 3.37 (*)    Hemoglobin 10.8 (*)    HCT 31.6 (*)    All other components within normal limits  LACTIC ACID, PLASMA  URINALYSIS, W/ REFLEX TO CULTURE (INFECTION SUSPECTED)    EKG None  Radiology DG Chest Portable 1 View  Result Date: 09/26/2022 CLINICAL DATA:  Fevers EXAM: PORTABLE CHEST 1 VIEW COMPARISON:  01/14/2022 FINDINGS: Cardiac shadow is enlarged. Defibrillator is again noted and stable. Aortic calcifications are seen. Central vascular congestion is noted and stable. No focal infiltrate is seen. Stimulator pack is noted of the right chest. IMPRESSION: Mild vascular congestion. Electronically Signed   By: Alcide Clever M.D.   On: 09/26/2022 23:35    Procedures Procedures    Medications Ordered in ED Medications  sodium chloride 0.9 % bolus 1,000 mL (1,000 mLs Intravenous Bolus 09/26/22 2338)  acetaminophen (TYLENOL) tablet 650 mg (650 mg Oral Given 09/26/22 2337)  LORazepam (ATIVAN) injection 0.5 mg (0.5 mg Intravenous Given 09/27/22 0039)    ED Course/ Medical Decision Making/ A&P Clinical Course as of 09/27/22 0334  Fri Sep 27, 2022  0309 Poke with the patient's wife, Cameron Potts.  I updated her.  She says at baseline he can be quite agitated but normally is oriented to himself and time.  Suspect that his increased confusion is related to his COVID and fever.  Will try to get him to a higher level of care at [facility].  She is agreeable to plan. [CH]    Clinical Course User Index [CH] Delfin Squillace, Mayer Masker, MD                             Medical Decision Making Amount and/or Complexity of Data Reviewed Labs: ordered. Radiology: ordered.  Risk OTC drugs. Prescription drug management.   This patient presents to the ED for concern of altered mental status, this involves an extensive number of treatment options, and is a  complaint that carries with it a high risk of complications and morbidity.  I considered the following differential and admission for this acute, potentially life threatening condition.  The differential diagnosis includes infection such as COVID, pneumonia, UTI, worsening dementia, metabolic derangement  MDM:    This is an 87 year old male who presents with increased confusion.  History of dementia.  Per the patient's wife he is normally oriented but can be agitated and confused.  He has had increasing confusion.  Did test positive for COVID per report.  He is nontoxic-appearing.  O2 sats 95%.  He has a temperature of 101.5.  He was given Tylenol.  Lactate normal.  No leukocytosis.  Chest x-ray without obvious pneumonia.  Urinalysis without obvious UTI.  COVID confirmed.  I have reviewed his chart.  Phenytoin levels approximately 2 weeks ago were within normal limits.  Do not feel this needs to be repeated.  Suspect that his increased confusion is likely related to acute COVID infection and fever.  Discussed this with the wife.  Will attempt to discuss with [facility] regarding their ability to care for him at an increased level of care.  Will plan for discharge and ongoing outpatient management of his known COVID.  (Labs, imaging, consults)  Labs: I Ordered, and personally interpreted labs.  The pertinent results include: CBC, CMP, urinalysis, COVID-19, lactate  Imaging Studies ordered: I ordered imaging studies including chest x-ray I independently visualized and interpreted imaging. I agree with the radiologist interpretation  Additional history obtained from EMS, wife.  External records from outside source obtained and reviewed including outpatient records  Cardiac Monitoring: The patient was maintained on a cardiac monitor.  If on the cardiac monitor, I personally viewed and interpreted the cardiac monitored which showed an underlying rhythm of: Sinus rhythm  Reevaluation: After the  interventions noted above, I reevaluated the patient and found that they have :stayed the same  Social Determinants of Health:  dementia, assisted living  Disposition: Discharge  Co morbidities that complicate the patient evaluation  Past Medical History:  Diagnosis Date   Abnormality of gait 12/21/2013   Atrial fibrillation    Blind left eye    Carcinoma in situ of prostate    Cardiac arrest    Glaucoma    Hypercholesteremia    Hyperlipidemia    Metabolic encephalopathy    Mild left ventricular systolic dysfunction    Presence of other specified functional implants    PSA elevation    Right frontal lobe lesion    Seizures    Tinea pedis    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grover)    Urinary urgency      Medicines Meds ordered this encounter  Medications   sodium chloride 0.9 % bolus 1,000 mL   acetaminophen (TYLENOL) tablet 650 mg   LORazepam (ATIVAN) injection 0.5 mg    I have reviewed the patients home medicines and have made adjustments as needed  Problem List / ED Course: Problem List Items Addressed This Visit   None Visit Diagnoses     COVID-19    -  Primary   Encephalopathy due to COVID-19 virus                       Final Clinical Impression(s) / ED Diagnoses Final diagnoses:  COVID-19  Encephalopathy due to COVID-19 virus    Rx / DC Orders ED Discharge Orders     None         Shon Baton, MD 09/27/22 [phone]

## 2022-09-27 DIAGNOSIS — Z7401 Bed confinement status: Secondary | ICD-10-CM | POA: Diagnosis not present

## 2022-09-27 DIAGNOSIS — R404 Transient alteration of awareness: Secondary | ICD-10-CM | POA: Diagnosis not present

## 2022-09-27 DIAGNOSIS — U071 COVID-19: Secondary | ICD-10-CM | POA: Diagnosis not present

## 2022-09-27 LAB — URINALYSIS, W/ REFLEX TO CULTURE (INFECTION SUSPECTED)
Bacteria, UA: NONE SEEN
Bilirubin Urine: NEGATIVE
Glucose, UA: NEGATIVE mg/dL
Hgb urine dipstick: NEGATIVE
Ketones, ur: NEGATIVE mg/dL
Leukocytes,Ua: NEGATIVE
Nitrite: NEGATIVE
Protein, ur: NEGATIVE mg/dL
Specific Gravity, Urine: 1.018 (ref 1.005–1.030)
pH: 6 (ref 5.0–8.0)

## 2022-09-27 LAB — RESP PANEL BY RT-PCR (RSV, FLU A&B, COVID)  RVPGX2
Influenza A by PCR: NEGATIVE
Influenza B by PCR: NEGATIVE
Resp Syncytial Virus by PCR: NEGATIVE
SARS Coronavirus 2 by RT PCR: POSITIVE — AB

## 2022-09-27 MED ORDER — LORAZEPAM 2 MG/ML IJ SOLN
0.5000 mg | Freq: Once | INTRAMUSCULAR | Status: AC
  Administered 2022-09-27: 0.5 mg via INTRAVENOUS
  Filled 2022-09-27: qty 1

## 2022-09-27 NOTE — ED Notes (Signed)
[facility] arranged for transport 

## 2022-09-27 NOTE — ED Notes (Signed)
Patient has a urine culture in the main lab 

## 2022-09-27 NOTE — ED Notes (Signed)
RN attempted to call [facility] to give report

## 2022-09-27 NOTE — Discharge Instructions (Signed)
You were seen today for confusion.  This is likely related to your COVID-19 infection and fever.  Continue outpatient medications and supportive measures.

## 2022-09-30 ENCOUNTER — Other Ambulatory Visit: Payer: Self-pay | Admitting: Internal Medicine

## 2022-09-30 ENCOUNTER — Non-Acute Institutional Stay (SKILLED_NURSING_FACILITY): Payer: Medicare Other | Admitting: Internal Medicine

## 2022-09-30 ENCOUNTER — Encounter: Payer: Self-pay | Admitting: Internal Medicine

## 2022-09-30 DIAGNOSIS — R531 Weakness: Secondary | ICD-10-CM | POA: Diagnosis not present

## 2022-09-30 DIAGNOSIS — E871 Hypo-osmolality and hyponatremia: Secondary | ICD-10-CM | POA: Diagnosis not present

## 2022-09-30 DIAGNOSIS — R21 Rash and other nonspecific skin eruption: Secondary | ICD-10-CM | POA: Diagnosis not present

## 2022-09-30 DIAGNOSIS — R410 Disorientation, unspecified: Secondary | ICD-10-CM

## 2022-09-30 DIAGNOSIS — R600 Localized edema: Secondary | ICD-10-CM

## 2022-09-30 DIAGNOSIS — I10 Essential (primary) hypertension: Secondary | ICD-10-CM | POA: Diagnosis not present

## 2022-09-30 DIAGNOSIS — U071 COVID-19: Secondary | ICD-10-CM | POA: Diagnosis not present

## 2022-09-30 DIAGNOSIS — G40909 Epilepsy, unspecified, not intractable, without status epilepticus: Secondary | ICD-10-CM

## 2022-09-30 DIAGNOSIS — R296 Repeated falls: Secondary | ICD-10-CM | POA: Diagnosis not present

## 2022-09-30 MED ORDER — ALPRAZOLAM 0.25 MG PO TABS: 0.2500 mg | ORAL_TABLET | Freq: Two times a day (BID) | ORAL | 0 refills | Status: DC | PRN

## 2022-09-30 NOTE — Progress Notes (Unsigned)
Provider:   Location:  [facility] Room Number: 160A Place of Service:  SNF (31)  PCP: Mahlon Gammon, MD Patient Care Team: Mahlon Gammon, MD as PCP - General (Internal Medicine) Thurmon Fair, MD as PCP - Cardiology (Cardiology) Jethro Bolus, MD as Consulting Physician (Ophthalmology) [name], MD (Inactive) as Consulting Physician (Neurology) Kermit Balo, DO (Geriatric Medicine)  Extended Emergency Contact Information Primary Emergency Contact: Dugar,[name] Address: [address]          [address] [place], [place] [address] D[place] of [place] Home Phone: [phone] Mobile Phone: [phone] Relation: Spouse Secondary Emergency Contact: Christiana Fuchs Home Phone: [phone] Mobile Phone: [phone] Relation: Son  Code Status: DNR Goals of Care: Advanced Directive information    09/30/2022    8:59 AM  Advanced Directives  Does Patient Have a Medical Advance Directive? Yes  Type of Advance Directive Living will;Out of facility DNR (pink MOST or yellow form);Healthcare Power of Attorney  Does patient want to make changes to medical advance directive? No - Patient declined  Copy of Healthcare Power of Attorney in Chart? No - copy requested      Chief Complaint  Patient presents with   New Admit To SNF    Patient is being seen for Admission to SNF    HPI: Patient is a 87 y.o. male seen today for Readmission to SNF Post Covid  [place] from [place] to [place] due to weakness and Confusion Patient has a history of she is seizure disorder, recurrent falls, hyponatremia, and rash, cognitive impairment history of chronic A-fib s/p ICD defibrillator  Patient lives in [place] but does need a lot of assistance. He was sent to ED on 4/11 for extreme weakness, falls, confusion .  He was diagnosed with COVID-19 in ED.  Was discharged back to rehab Per rehab nurse he was unable to do anything initially but is slowly getting better is  getting more independent and getting up.  His mental status was back to baseline this morning.  He still looked weak. Appetite is fair C/o Itching in the back No Nausea or Vomiting  No Cough or SOB or fever  Past Medical History:  Diagnosis Date   Abnormality of gait 12/21/2013   Atrial fibrillation    Blind left eye    Carcinoma in situ of prostate    Cardiac arrest    Glaucoma    Hypercholesteremia    Hyperlipidemia    Metabolic encephalopathy    Mild left ventricular systolic dysfunction    Presence of other specified functional implants    PSA elevation    Right frontal lobe lesion    Seizures    Tinea pedis    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grover)    Urinary urgency    Past Surgical History:  Procedure Laterality Date   CARDIAC DEFIBRILLATOR PLACEMENT     cataract surgery     COLONOSCOPY     CRANIOTOMY Right    right anterior temporal resection   defibrillator replaced  March 2016   IMPLANTATION VAGAL NERVE STIMULATOR  June 2016   NASAL SINUS SURGERY     TONSILLECTOMY     age 27   VASECTOMY  63    reports that he has never smoked. He has never used smokeless tobacco. He reports that he does not currently use alcohol. He reports that he does not use drugs. Social History   Socioeconomic History   Marital status: Married    Spouse  name: Erskine Squibb   Number of children: 3   Years of education: MBA   Highest education level: Not on file  Occupational History   Occupation: Retired   Occupation: Field seismologist  Tobacco Use   Smoking status: Never   Smokeless tobacco: Never   Tobacco comments:    quit 1960s  Vaping Use   Vaping Use: Never used  Substance and Sexual Activity   Alcohol use: Not Currently    Comment: former beer drinker   Drug use: No   Sexual activity: Not Currently  Other Topics Concern   Not on file  Social History Narrative   Lives Assisted Living, [facility]   Right Handed   Drinks 2-3 cups caffeine daily   Social  Determinants of Health   Financial Resource Strain: Low Risk  (04/15/2018)   Overall Financial Resource Strain (CARDIA)    Difficulty of Paying Living Expenses: Not hard at all  Food Insecurity: No Food Insecurity (04/15/2018)   Hunger Vital Sign    Worried About Running Out of Food in the Last Year: Never true    Ran Out of Food in the Last Year: Never true  Transportation Needs: No Transportation Needs (04/15/2018)   PRAPARE - Administrator, Civil Service (Medical): No    Lack of Transportation (Non-Medical): No  Physical Activity: Sufficiently Active (04/15/2018)   Exercise Vital Sign    Days of Exercise per Week: 5 days    Minutes of Exercise per Session: 30 min  Stress: No Stress Concern Present (04/15/2018)   [employer] of Occupational Health - Occupational Stress Questionnaire    Feeling of Stress : Not at all  Social Connections: Somewhat Isolated (04/15/2018)   Social Connection and Isolation Panel [NHANES]    Frequency of Communication with Friends and Family: Three times a week    Frequency of Social Gatherings with Friends and Family: Three times a week    Attends Religious Services: Never    Active Member of Clubs or Organizations: No    Attends Banker Meetings: Never    Marital Status: Married  Catering manager Violence: Not At Risk (04/15/2018)   Humiliation, Afraid, Rape, and Kick questionnaire    Fear of Current or Ex-Partner: No    Emotionally Abused: No    Physically Abused: No    Sexually Abused: No    Functional Status Survey:    Family History  Problem Relation Age of Onset   Cancer - Lung Mother    Cancer - Prostate Father     Health Maintenance  Topic Date Due   Medicare Annual Wellness ([name])  10/02/2022 (Originally 06/14/2020)   COVID-19 Vaccine (5 - 2023-24 season) 10/03/2022 (Originally 06/17/2022)   Zoster Vaccines- Shingrix (2 of 2) 01/16/2023 (Originally 01/29/2017)   INFLUENZA VACCINE  01/16/2023    DTaP/Tdap/Td (4 - Td or Tdap) 09/18/2030   Pneumonia Vaccine 74+ Years old  Completed   HPV VACCINES  Aged Out    Allergies  Allergen Reactions   Depakote [Divalproex Sodium] Other (See Comments)    Arthralgias     Outpatient Encounter Medications as of 09/30/2022  Medication Sig   acetaminophen (TYLENOL) 325 MG tablet Take 650 mg by mouth every 6 (six) hours as needed for mild pain or moderate pain.   ALPRAZolam (XANAX) 0.25 MG tablet Take 1 tablet (0.25 mg total) by mouth 2 (two) times daily as needed for anxiety.   brimonidine-timolol (COMBIGAN) 0.2-0.5 % ophthalmic solution Place 1 drop into  both eyes every 12 (twelve) hours.    calcium carbonate (TUMS SMOOTHIES) 750 MG chewable tablet Chew 2 tablets by mouth 4 (four) times daily as needed for heartburn.   camphor-menthol (SARNA) lotion Apply 1 Application topically every 8 (eight) hours as needed for itching.   cetirizine (ZYRTEC) 10 MG tablet Take 10 mg by mouth as needed for allergies.   cholecalciferol (VITAMIN D) 400 units TABS tablet Take 400 Units by mouth 2 (two) times daily.   clobetasol cream (TEMOVATE) 0.05 % Apply 1 application  topically 2 (two) times daily as needed (rash).   DILANTIN 100 MG ER capsule Take 300 mg by mouth. In the evening   econazole nitrate 1 % cream Apply 1 Application topically 2 (two) times daily as needed.   finasteride (PROSCAR) 5 MG tablet Take 5 mg by mouth every morning.   flecainide (TAMBOCOR) 50 MG tablet Take 50 mg by mouth 2 (two) times daily.   hydrALAZINE (APRESOLINE) 10 MG tablet Take 1 tablet (10 mg total) by mouth 2 (two) times daily as needed.   lacosamide (VIMPAT) 200 MG TABS tablet Take 200 mg by mouth 2 (two) times daily.   LAGEVRIO 200 MG CAPS capsule Take by mouth.   latanoprost (XALATAN) 0.005 % ophthalmic solution Place 1 drop into both eyes at bedtime.   Levetiracetam 750 MG TB24 Take 2,250 mg by mouth daily.   LORazepam (ATIVAN) 2 MG/ML concentrated solution Take 0.5 mg by  mouth as needed for anxiety.   miconazole (MICOTIN) 2 % powder Apply 1 Application topically 2 (two) times daily.   phenytoin (DILANTIN) 50 MG tablet Chew 75 mg by mouth daily.   polyethylene glycol (MIRALAX / GLYCOLAX) 17 g packet Take 17 g by mouth as needed.   polyethylene glycol (MIRALAX / GLYCOLAX) packet Take 17 g by mouth every other day.   sodium chloride 1 g tablet Take 2 g by mouth. Take 2 gm in the AM and 1 gm in the Pm   tamsulosin (FLOMAX) 0.4 MG CAPS capsule Take 0.4 mg by mouth daily.   triamcinolone cream (KENALOG) 0.1 % Apply 1 Application topically 2 (two) times daily.   triamcinolone cream (KENALOG) 0.1 % Apply 1 Application topically as needed.   phenytoin (DILANTIN) 100 MG ER capsule Take 300 mg by mouth every evening. (Patient not taking: Reported on 09/30/2022)   No facility-administered encounter medications on file as of 09/30/2022.    Review of Systems  Constitutional:  Positive for activity change. Negative for appetite change and unexpected weight change.  HENT: Negative.    Respiratory:  Negative for cough and shortness of breath.   Cardiovascular:  Positive for leg swelling.  Gastrointestinal:  Negative for constipation.  Genitourinary:  Positive for frequency.  Musculoskeletal:  Positive for gait problem. Negative for arthralgias and myalgias.  Skin: Negative.  Negative for rash.  Neurological:  Positive for weakness. Negative for dizziness.  Psychiatric/Behavioral:  Positive for confusion. Negative for sleep disturbance.   All other systems reviewed and are negative.   Vitals:   09/30/22 0848  BP: (!) 161/74  Pulse: 60  Resp: 14  Temp: 97.8 F (36.6 C)  TempSrc: Temporal  SpO2: 96%  Weight: 170 lb (77.1 kg)  Height: 5\' 9"  (1.753 m)   Body mass index is 25.1 kg/m. Physical Exam Vitals reviewed.  Constitutional:      Appearance: Normal appearance.  HENT:     Head: Normocephalic.     Nose: Nose normal.  Mouth/Throat:     Mouth: Mucous  membranes are moist.     Pharynx: Oropharynx is clear.  Eyes:     Pupils: Pupils are equal, round, and reactive to light.  Cardiovascular:     Rate and Rhythm: Normal rate and regular rhythm.     Pulses: Normal pulses.     Heart sounds: No murmur heard. Pulmonary:     Effort: Pulmonary effort is normal. No respiratory distress.     Breath sounds: Normal breath sounds. No rales.  Abdominal:     General: Abdomen is flat. Bowel sounds are normal.     Palpations: Abdomen is soft.  Musculoskeletal:        General: Swelling present.     Cervical back: Neck supple.  Skin:    General: Skin is warm.     Comments: Multiple Skin tears on her Arms  Neurological:     General: No focal deficit present.     Mental Status: He is alert.  Psychiatric:        Mood and Affect: Mood normal.        Thought Content: Thought content normal.     Labs reviewed: Basic Metabolic Panel: Recent Labs    08/24/22 1447 08/26/22 0000 08/28/22 1345 08/30/22 0000 09/26/22 2224  NA 125* 126* 128* 130* 130*  K 4.3 4.7 4.2  --  3.9  CL 92* 95* 97* 99 99  CO2 25 17 25 21 24   GLUCOSE 102*  --  98  --  120*  BUN 14 11 10 8 15   CREATININE 0.73 0.6 0.62 0.6 0.78  CALCIUM 9.1 8.8 8.9 8.5* 8.4*  MG 1.7  --   --   --   --    Liver Function Tests: Recent Labs    08/24/22 1447 08/28/22 1345 09/26/22 2224  AST 27 20 22   ALT 26 16 15   ALKPHOS 116 116 113  BILITOT 0.5 0.6 0.7  PROT 7.3 6.9 7.3  ALBUMIN 4.3 4.0 4.0   No results for input(s): "LIPASE", "AMYLASE" in the last 8760 hours. No results for input(s): "AMMONIA" in the last 8760 hours. CBC: Recent Labs    08/24/22 1447 08/28/22 1345 09/26/22 2224  WBC 4.6 5.1 5.9  NEUTROABS 1.9 2.2 3.8  HGB 12.0* 11.0* 10.8*  HCT 32.3* 29.9* 31.6*  MCV 88.7 89.5 93.8  PLT 219 212 207   Cardiac Enzymes: No results for input(s): "CKTOTAL", "CKMB", "CKMBINDEX", "TROPONINI" in the last 8760 hours. BNP: Invalid input(s): "POCBNP" No results found for:  "HGBA1C" Lab Results  Component Value Date   TSH 2.685 01/19/2022   Lab Results  Component Value Date   VITAMINB12 587 03/15/2021   No results found for: "FOLATE" No results found for: "IRON", "TIBC", "FERRITIN"  Imaging and Procedures obtained prior to SNF admission: DG Chest Portable 1 View  Result Date: 09/26/2022 CLINICAL DATA:  Fevers EXAM: PORTABLE CHEST 1 VIEW COMPARISON:  01/14/2022 FINDINGS: Cardiac shadow is enlarged. Defibrillator is again noted and stable. Aortic calcifications are seen. Central vascular congestion is noted and stable. No focal infiltrate is seen. Stimulator pack is noted of the right chest. IMPRESSION: Mild vascular congestion. Electronically Signed   By: Alcide Clever M.D.   On: 09/26/2022 23:35    Assessment/Plan 1. COVID-19 On Molnupiravir Still weak Will need to stay in Rehab till Wed As discussed with the nurse NO Cough or SOB  2. Weakness Due to Covid Continue to stay in rehab  3. Disorientation Mental status seemed  at baseline  4. Hyponatremia Sodium level was 130 in ED Cannot reduce his Sodium tabs anymore and will keep him on same dose  5. Edema of both lower extremities Can be due to Sodium Tabs Continue Elevation for now  6. Essential hypertension BP still elevated Will Continue to watch Does have Hydralazine PRN for High BP Little reluctant to start anyhting due to his h/o Recurent falls  7. Seizure disorder On Multiple meds Follows with Neyrology  8. Recurrent falls Contineu to be his issue as does nto use his elaker  9. Rash and nonspecific skin eruption On Trimicinolone    Family/ staff Communication:   Labs/tests ordered:

## 2022-10-01 ENCOUNTER — Other Ambulatory Visit: Payer: Self-pay | Admitting: Orthopedic Surgery

## 2022-10-01 DIAGNOSIS — G40919 Epilepsy, unspecified, intractable, without status epilepticus: Secondary | ICD-10-CM

## 2022-10-01 DIAGNOSIS — Z79899 Other long term (current) drug therapy: Secondary | ICD-10-CM | POA: Diagnosis not present

## 2022-10-01 MED ORDER — LACOSAMIDE 200 MG PO TABS: 200.0000 mg | ORAL_TABLET | Freq: Two times a day (BID) | ORAL | 3 refills | Status: DC

## 2022-10-21 ENCOUNTER — Non-Acute Institutional Stay: Payer: Medicare Other | Admitting: Adult Health

## 2022-10-21 ENCOUNTER — Encounter: Payer: Self-pay | Admitting: Adult Health

## 2022-10-21 VITALS — BP 142/86 | HR 77 | Temp 97.9°F | Resp 16 | Ht 69.0 in | Wt 166.2 lb

## 2022-10-21 DIAGNOSIS — E871 Hypo-osmolality and hyponatremia: Secondary | ICD-10-CM | POA: Diagnosis not present

## 2022-10-21 DIAGNOSIS — D638 Anemia in other chronic diseases classified elsewhere: Secondary | ICD-10-CM | POA: Diagnosis not present

## 2022-10-21 DIAGNOSIS — I951 Orthostatic hypotension: Secondary | ICD-10-CM | POA: Diagnosis not present

## 2022-10-21 DIAGNOSIS — R350 Frequency of micturition: Secondary | ICD-10-CM

## 2022-10-21 DIAGNOSIS — G20C Parkinsonism, unspecified: Secondary | ICD-10-CM | POA: Diagnosis not present

## 2022-10-21 DIAGNOSIS — R269 Unspecified abnormalities of gait and mobility: Secondary | ICD-10-CM

## 2022-10-21 DIAGNOSIS — I482 Chronic atrial fibrillation, unspecified: Secondary | ICD-10-CM | POA: Diagnosis not present

## 2022-10-21 DIAGNOSIS — R21 Rash and other nonspecific skin eruption: Secondary | ICD-10-CM

## 2022-10-21 DIAGNOSIS — N401 Enlarged prostate with lower urinary tract symptoms: Secondary | ICD-10-CM | POA: Diagnosis not present

## 2022-10-21 DIAGNOSIS — G40909 Epilepsy, unspecified, not intractable, without status epilepticus: Secondary | ICD-10-CM

## 2022-10-21 NOTE — Progress Notes (Signed)
Location:  [facility]  POS: Clinic  Provider: Fletcher Anon, ANP  Code Status: DNR Goals of Care:     10/21/2022    1:58 PM  Advanced Directives  Does Patient Have a Medical Advance Directive? Yes  Type of Advance Directive Living will;Out of facility DNR (pink MOST or yellow form);Healthcare Power of Attorney  Does patient want to make changes to medical advance directive? No - Patient declined  Copy of Healthcare Power of Attorney in Chart? No - copy requested     Chief Complaint  Patient presents with   Medical Management of Chronic Issues    Patient is here for a follow up   Quality Metric Gaps    Patient declined [name]    HPI: Patient is a 87 y.o. male seen today for medical management of chronic diseases.  He is here for f/u after a rehab admission due to weakness and Covid.   PMH: afib off coumadin due to falls, seizures, right temporal lobe resection, vagus nerve stimulator, SIADH, HTN, Grover's disease, Cardiac arrest with defibrillator, HLD, and BPH  Reports that his balance is terrible but he uses a walker but unchanged Falls frequently Lives in [place] No acute complaints Has grover's disease and uses triamcinolone/eucerin which help  Reports some itching to arms and legs Can cause scratching with excoriation  Has poly mem to left arm due to scratching.   He was in rehab in April due to covid and weakness now at baseline.  Does need help with cuing, dressing, bathing, med management in [place] Past Medical History:  Diagnosis Date   Abnormality of gait 12/21/2013   Atrial fibrillation (HCC)    Blind left eye    Carcinoma in situ of prostate    Cardiac arrest (HCC)    Glaucoma    Hypercholesteremia    Hyperlipidemia    Metabolic encephalopathy    Mild left ventricular systolic dysfunction    Presence of other specified functional implants    PSA elevation    Right frontal lobe lesion    Seizures (HCC)    Tinea pedis    Transient acantholytic dermatosis  (grover)    Urinary urgency     Past Surgical History:  Procedure Laterality Date   CARDIAC DEFIBRILLATOR PLACEMENT     cataract surgery     COLONOSCOPY     CRANIOTOMY Right    right anterior temporal resection   defibrillator replaced  March 2016   IMPLANTATION VAGAL NERVE STIMULATOR  June 2016   NASAL SINUS SURGERY     TONSILLECTOMY     age 39   VASECTOMY  26    Allergies  Allergen Reactions   Depakote [Divalproex Sodium] Other (See Comments)    Arthralgias     Outpatient Encounter Medications as of 10/21/2022  Medication Sig   acetaminophen (TYLENOL) 325 MG tablet Take 650 mg by mouth every 6 (six) hours as needed for mild pain or moderate pain.   ALPRAZolam (XANAX) 0.25 MG tablet Take 1 tablet (0.25 mg total) by mouth 2 (two) times daily as needed for anxiety.   brimonidine-timolol (COMBIGAN) 0.2-0.5 % ophthalmic solution Place 1 drop into both eyes every 12 (twelve) hours.    calcium carbonate (TUMS SMOOTHIES) 750 MG chewable tablet Chew 2 tablets by mouth 4 (four) times daily as needed for heartburn.   camphor-menthol (SARNA) lotion Apply 1 Application topically every 8 (eight) hours as needed for itching.   cetirizine (ZYRTEC) 10 MG tablet Take 10 mg by mouth  as needed for allergies.   cholecalciferol (VITAMIN D) 400 units TABS tablet Take 400 Units by mouth 2 (two) times daily.   clobetasol cream (TEMOVATE) 0.05 % Apply 1 application  topically 2 (two) times daily as needed (rash).   DILANTIN 100 MG ER capsule Take 300 mg by mouth. In the evening   econazole nitrate 1 % cream Apply 1 Application topically 2 (two) times daily as needed.   finasteride (PROSCAR) 5 MG tablet Take 5 mg by mouth every morning.   flecainide (TAMBOCOR) 50 MG tablet Take 50 mg by mouth 2 (two) times daily.   hydrALAZINE (APRESOLINE) 10 MG tablet Take 1 tablet (10 mg total) by mouth 2 (two) times daily as needed.   lacosamide (VIMPAT) 200 MG TABS tablet Take 1 tablet (200 mg total) by mouth 2  (two) times daily.   latanoprost (XALATAN) 0.005 % ophthalmic solution Place 1 drop into both eyes at bedtime.   Levetiracetam 750 MG TB24 Take 2,250 mg by mouth daily.   loratadine (CLARITIN) 10 MG tablet Take 10 mg by mouth daily.   LORazepam (ATIVAN) 2 MG/ML concentrated solution Take 0.5 mg by mouth as needed for anxiety.   miconazole (MICOTIN) 2 % powder Apply 1 Application topically 2 (two) times daily.   phenytoin (DILANTIN) 50 MG tablet Chew 75 mg by mouth daily.   polyethylene glycol (MIRALAX / GLYCOLAX) 17 g packet Take 17 g by mouth as needed.   polyethylene glycol (MIRALAX / GLYCOLAX) packet Take 17 g by mouth every other day.   sodium chloride 1 g tablet Take 2 g by mouth. Take 2 gm in the AM and 1 gm in the Pm   tamsulosin (FLOMAX) 0.4 MG CAPS capsule Take 0.4 mg by mouth daily.   triamcinolone cream (KENALOG) 0.1 % Apply 1 Application topically 2 (two) times daily.   LAGEVRIO 200 MG CAPS capsule Take 4 capsules by mouth 2 (two) times daily. (Patient not taking: Reported on 10/21/2022)   triamcinolone cream (KENALOG) 0.1 % Apply 1 Application topically as needed. (Patient not taking: Reported on 10/21/2022)   No facility-administered encounter medications on file as of 10/21/2022.    Review of Systems:  Review of Systems  Constitutional:  Negative for activity change, appetite change, chills, diaphoresis, fatigue, fever and unexpected weight change.  Respiratory:  Negative for cough, shortness of breath, wheezing and stridor.   Cardiovascular:  Positive for leg swelling. Negative for chest pain and palpitations.  Gastrointestinal:  Negative for abdominal distention, abdominal pain, constipation and diarrhea.  Genitourinary:  Negative for difficulty urinating and dysuria.  Musculoskeletal:  Positive for gait problem. Negative for arthralgias, back pain, joint swelling and myalgias.  Skin:  Positive for rash.  Neurological:  Positive for seizures (h/o) and weakness. Negative for  dizziness, syncope, facial asymmetry, speech difficulty and headaches.  Hematological:  Negative for adenopathy. Does not bruise/bleed easily.  Psychiatric/Behavioral:  Negative for agitation, behavioral problems and confusion.     Health Maintenance  Topic Date Due   Medicare Annual Wellness ([name])  06/14/2020   Zoster Vaccines- Shingrix (2 of 2) 01/16/2023 (Originally 01/29/2017)   COVID-19 Vaccine (6 - 2023-24 season) 12/04/2022   INFLUENZA VACCINE  01/16/2023   DTaP/Tdap/Td (4 - Td or Tdap) 09/18/2030   Pneumonia Vaccine 68+ Years old  Completed   HPV VACCINES  Aged Out    Physical Exam: Vitals:   10/21/22 1356  BP: (!) 142/86  Pulse: 77  Resp: 16  Temp: 97.9 F (36.6 C)  TempSrc: Temporal  SpO2: 99%  Weight: 166 lb 3.2 oz (75.4 kg)  Height: 5\' 9"  (1.753 m)   Body mass index is 24.54 kg/m. Physical Exam Constitutional:      General: He is not in acute distress.    Appearance: He is not diaphoretic.  HENT:     Head: Normocephalic and atraumatic.     Right Ear: Tympanic membrane normal.     Left Ear: Tympanic membrane normal.     Nose: Nose normal.     Mouth/Throat:     Mouth: Mucous membranes are moist.     Pharynx: Oropharynx is clear.  Neck:     Thyroid: No thyromegaly.     Vascular: No JVD.     Trachea: No tracheal deviation.  Cardiovascular:     Rate and Rhythm: Normal rate. Rhythm irregular.     Heart sounds: No murmur heard. Pulmonary:     Effort: Pulmonary effort is normal. No respiratory distress.     Breath sounds: Normal breath sounds. No wheezing.  Abdominal:     General: Bowel sounds are normal. There is no distension.     Palpations: Abdomen is soft.     Tenderness: There is no abdominal tenderness.  Lymphadenopathy:     Cervical: No cervical adenopathy.  Skin:    General: Skin is warm and dry.     Comments: Maculopapular rash to arms and chest.  Improved overall. Excoriation to left lower arm covered with polymem clean dry intact   Neurological:     Mental Status: He is alert and oriented to person, place, and time.     Cranial Nerves: No cranial nerve deficit.     Labs reviewed: Basic Metabolic Panel: Recent Labs    01/19/22 1658 01/19/22 2206 08/24/22 1447 08/26/22 0000 08/28/22 1345 08/30/22 0000 09/26/22 2224  NA 123*   < > 125* 126* 128* 130* 130*  K 3.8   < > 4.3 4.7 4.2  --  3.9  CL 91*   < > 92* 95* 97* 99 99  CO2 21*   < > 25 17 25 21 24   GLUCOSE 131*   < > 102*  --  98  --  120*  BUN 7*   < > 14 11 10 8 15   CREATININE 0.62   < > 0.73 0.6 0.62 0.6 0.78  CALCIUM 8.5*   < > 9.1 8.8 8.9 8.5* 8.4*  MG  --   --  1.7  --   --   --   --   TSH 2.685  --   --   --   --   --   --    < > = values in this interval not displayed.   Liver Function Tests: Recent Labs    08/24/22 1447 08/28/22 1345 09/26/22 2224  AST 27 20 22   ALT 26 16 15   ALKPHOS 116 116 113  BILITOT 0.5 0.6 0.7  PROT 7.3 6.9 7.3  ALBUMIN 4.3 4.0 4.0   No results for input(s): "LIPASE", "AMYLASE" in the last 8760 hours. No results for input(s): "AMMONIA" in the last 8760 hours. CBC: Recent Labs    08/24/22 1447 08/28/22 1345 09/26/22 2224  WBC 4.6 5.1 5.9  NEUTROABS 1.9 2.2 3.8  HGB 12.0* 11.0* 10.8*  HCT 32.3* 29.9* 31.6*  MCV 88.7 89.5 93.8  PLT 219 212 207   Lipid Panel: Recent Labs    06/24/22 0000  CHOL 214*  HDL 83*  TRIG 59  No results found for: "HGBA1C"  Procedures since last visit: DG Chest Portable 1 View  Result Date: 09/26/2022 CLINICAL DATA:  Fevers EXAM: PORTABLE CHEST 1 VIEW COMPARISON:  01/14/2022 FINDINGS: Cardiac shadow is enlarged. Defibrillator is again noted and stable. Aortic calcifications are seen. Central vascular congestion is noted and stable. No focal infiltrate is seen. Stimulator pack is noted of the right chest. IMPRESSION: Mild vascular congestion. Electronically Signed   By: Alcide Clever M.D.   On: 09/26/2022 23:35    Assessment/Plan  1. Rash Improved  Continue  Eucerin/Triamcinolone.   2. Seizure disorder (HCC) No new events Continue dilantin and vimpat and keppra  Follows with Dr Teresa Coombs  3. Hyponatremia NA 130 on salt tabs Monitor labs, he doesn't want labs drawn frequently   4. Parkinsonian syndrome associated with symptomatic orthostatic hypotension No current issues Rise slowly   5. Benign prostatic hyperplasia with urinary frequency Continue flomax  6. Abnormality of gait Falls frequent due to gait instability and poor judgement  Fall prec Walker at all times.   7. Chronic atrial fibrillation Not on anticoagulation due to falls with head bleeds Rate is controlled  8. Anemia of chronic disease Lab Results  Component Value Date   HGB 10.8 (L) 09/26/2022   Normal MCV Follow CBC    Labs/tests ordered:  * No order type specified * BMP CBC Dilantin level prior to apt   Next appt: pt wants apt in 5 months not sooner    Total time 30:  time greater than 50% of total time spent doing pt counseling and coordination of care

## 2022-10-31 DIAGNOSIS — E871 Hypo-osmolality and hyponatremia: Secondary | ICD-10-CM | POA: Diagnosis not present

## 2022-10-31 LAB — BASIC METABOLIC PANEL
BUN: 12 (ref 4–21)
CO2: 26 — AB (ref 13–22)
Chloride: 101 (ref 99–108)
Creatinine: 0.7 (ref 0.6–1.3)
Glucose: 92
Potassium: 5.1 mEq/L (ref 3.5–5.1)
Sodium: 136 — AB (ref 137–147)

## 2022-10-31 LAB — COMPREHENSIVE METABOLIC PANEL
Calcium: 9.1 (ref 8.7–10.7)
eGFR: >90

## 2022-11-15 DIAGNOSIS — N401 Enlarged prostate with lower urinary tract symptoms: Secondary | ICD-10-CM | POA: Diagnosis not present

## 2022-11-15 DIAGNOSIS — R350 Frequency of micturition: Secondary | ICD-10-CM | POA: Diagnosis not present

## 2023-01-01 DIAGNOSIS — D692 Other nonthrombocytopenic purpura: Secondary | ICD-10-CM | POA: Diagnosis not present

## 2023-01-01 DIAGNOSIS — L570 Actinic keratosis: Secondary | ICD-10-CM | POA: Diagnosis not present

## 2023-01-01 DIAGNOSIS — L111 Transient acantholytic dermatosis [Grover]: Secondary | ICD-10-CM | POA: Diagnosis not present

## 2023-01-27 ENCOUNTER — Other Ambulatory Visit: Payer: Self-pay | Admitting: Adult Health

## 2023-01-27 DIAGNOSIS — G40919 Epilepsy, unspecified, intractable, without status epilepticus: Secondary | ICD-10-CM

## 2023-01-27 MED ORDER — LACOSAMIDE 200 MG PO TABS
200.0000 mg | ORAL_TABLET | Freq: Two times a day (BID) | ORAL | 3 refills | Status: DC
Start: 2023-01-27 — End: 2023-05-29

## 2023-01-28 DIAGNOSIS — B351 Tinea unguium: Secondary | ICD-10-CM | POA: Diagnosis not present

## 2023-01-28 DIAGNOSIS — M2041 Other hammer toe(s) (acquired), right foot: Secondary | ICD-10-CM | POA: Diagnosis not present

## 2023-01-28 DIAGNOSIS — M2042 Other hammer toe(s) (acquired), left foot: Secondary | ICD-10-CM | POA: Diagnosis not present

## 2023-02-06 DIAGNOSIS — M6389 Disorders of muscle in diseases classified elsewhere, multiple sites: Secondary | ICD-10-CM | POA: Diagnosis not present

## 2023-02-06 DIAGNOSIS — R296 Repeated falls: Secondary | ICD-10-CM | POA: Diagnosis not present

## 2023-02-06 DIAGNOSIS — G20A1 Parkinson's disease without dyskinesia, without mention of fluctuations: Secondary | ICD-10-CM | POA: Diagnosis not present

## 2023-02-06 DIAGNOSIS — R278 Other lack of coordination: Secondary | ICD-10-CM | POA: Diagnosis not present

## 2023-02-07 DIAGNOSIS — R278 Other lack of coordination: Secondary | ICD-10-CM | POA: Diagnosis not present

## 2023-02-07 DIAGNOSIS — G20A1 Parkinson's disease without dyskinesia, without mention of fluctuations: Secondary | ICD-10-CM | POA: Diagnosis not present

## 2023-02-07 DIAGNOSIS — M6389 Disorders of muscle in diseases classified elsewhere, multiple sites: Secondary | ICD-10-CM | POA: Diagnosis not present

## 2023-02-07 DIAGNOSIS — R296 Repeated falls: Secondary | ICD-10-CM | POA: Diagnosis not present

## 2023-02-10 DIAGNOSIS — M6389 Disorders of muscle in diseases classified elsewhere, multiple sites: Secondary | ICD-10-CM | POA: Diagnosis not present

## 2023-02-10 DIAGNOSIS — G20A1 Parkinson's disease without dyskinesia, without mention of fluctuations: Secondary | ICD-10-CM | POA: Diagnosis not present

## 2023-02-10 DIAGNOSIS — R278 Other lack of coordination: Secondary | ICD-10-CM | POA: Diagnosis not present

## 2023-02-10 DIAGNOSIS — R296 Repeated falls: Secondary | ICD-10-CM | POA: Diagnosis not present

## 2023-02-13 DIAGNOSIS — R278 Other lack of coordination: Secondary | ICD-10-CM | POA: Diagnosis not present

## 2023-02-13 DIAGNOSIS — R296 Repeated falls: Secondary | ICD-10-CM | POA: Diagnosis not present

## 2023-02-13 DIAGNOSIS — M6389 Disorders of muscle in diseases classified elsewhere, multiple sites: Secondary | ICD-10-CM | POA: Diagnosis not present

## 2023-02-13 DIAGNOSIS — G20A1 Parkinson's disease without dyskinesia, without mention of fluctuations: Secondary | ICD-10-CM | POA: Diagnosis not present

## 2023-02-18 ENCOUNTER — Encounter: Payer: Self-pay | Admitting: Orthopedic Surgery

## 2023-02-18 ENCOUNTER — Non-Acute Institutional Stay (SKILLED_NURSING_FACILITY): Payer: Medicare Other | Admitting: Orthopedic Surgery

## 2023-02-18 DIAGNOSIS — S81802A Unspecified open wound, left lower leg, initial encounter: Secondary | ICD-10-CM

## 2023-02-18 NOTE — Progress Notes (Signed)
[facility] Room Number: 536-A Place of Service:  SNF [phone]) Provider:  Hazle Nordmann, NP  PCP: Mahlon Gammon, MD  Patient Care Team: Mahlon Gammon, MD as PCP - General (Internal Medicine) Thurmon Fair, MD as PCP - Cardiology (Cardiology) Jethro Bolus, MD as Consulting Physician (Ophthalmology) [name], MD (Inactive) as Consulting Physician (Neurology) Kermit Balo, DO (Geriatric Medicine)  Extended Emergency Contact Information Primary Emergency Contact: Aday,[name] Address: [address]          [address] [place], [place] [address] Darden Amber of [place] Home Phone: [phone] Mobile Phone: [phone] Relation: Spouse Secondary Emergency Contact: Christiana Fuchs Home Phone: [phone] Mobile Phone: [phone] Relation: Son  Code Status:  DNR Goals of care: Advanced Directive information    02/18/2023   11:29 AM  Advanced Directives  Does Patient Have a Medical Advance Directive? Yes  Type of Advance Directive Out of facility DNR (pink MOST or yellow form);Living will  Does patient want to make changes to medical advance directive? No - Patient declined     Chief Complaint  Patient presents with   Acute Visit    Boil on shin    HPI:  Pt is a 87 y.o. male seen today for acute visit due to left lower leg lesion.   He currently resides on the assisted living unit at [facility]. PMH: atrial fibrillation, HLD, Parkinsonian syndrome, s/p thyroid nodule, seizure disorder, status epilepticus, Grover's disease, osteopenia, BPH, and abnormal gait.   Nursing noted open lesion to left lower leg. He denies pain, swelling or drainage. H/o Grover's disease with rashes/itching to body, specifically trunk portion. No recent exacerbation of disease. No recent fall or injury. He admits skin is thin and fragile. Afebrile. Vitals stable.    Past Medical History:  Diagnosis Date   Abnormality of gait 12/21/2013   Atrial  fibrillation (HCC)    Blind left eye    Carcinoma in situ of prostate    Cardiac arrest (HCC)    Glaucoma    Hypercholesteremia    Hyperlipidemia    Metabolic encephalopathy    Mild left ventricular systolic dysfunction    Presence of other specified functional implants    PSA elevation    Right frontal lobe lesion    Seizures (HCC)    Tinea pedis    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grover)    Urinary urgency    Past Surgical History:  Procedure Laterality Date   CARDIAC DEFIBRILLATOR PLACEMENT     cataract surgery     COLONOSCOPY     CRANIOTOMY Right    right anterior temporal resection   defibrillator replaced  March 2016   IMPLANTATION VAGAL NERVE STIMULATOR  June 2016   NASAL SINUS SURGERY     TONSILLECTOMY     age 57   VASECTOMY  71    Allergies  Allergen Reactions   Depakote [Divalproex Sodium] Other (See Comments)    Arthralgias     Allergies as of 02/18/2023       Reactions   Depakote [divalproex Sodium] Other (See Comments)   Arthralgias        Medication List        Accurate as of February 18, 2023 11:29 AM. If you have any questions, ask your nurse or doctor.          STOP taking these medications    loratadine 10 MG tablet Commonly known as: CLARITIN Stopped by: Octavia Heir  TAKE these medications    acetaminophen 325 MG tablet Commonly known as: TYLENOL Take 650 mg by mouth every 6 (six) hours as needed for mild pain or moderate pain.   ALPRAZolam 0.25 MG tablet Commonly known as: XANAX Take 1 tablet (0.25 mg total) by mouth 2 (two) times daily as needed for anxiety.   brimonidine-timolol 0.2-0.5 % ophthalmic solution Commonly known as: COMBIGAN Place 1 drop into both eyes every 12 (twelve) hours.   camphor-menthol lotion Commonly known as: SARNA Apply 1 Application topically every 8 (eight) hours as needed for itching.   cetirizine 10 MG tablet Commonly known as: ZYRTEC Take 10 mg by mouth as needed for  allergies.   cholecalciferol 10 MCG (400 UNIT) Tabs tablet Commonly known as: VITAMIN D3 Take 400 Units by mouth 2 (two) times daily.   clobetasol cream 0.05 % Commonly known as: TEMOVATE Apply 1 application  topically 2 (two) times daily as needed (rash).   Dilantin 100 MG ER capsule Generic drug: phenytoin Take 300 mg by mouth. In the evening   econazole nitrate 1 % cream Apply 1 Application topically 2 (two) times daily as needed.   finasteride 5 MG tablet Commonly known as: PROSCAR Take 5 mg by mouth every morning.   flecainide 50 MG tablet Commonly known as: TAMBOCOR Take 50 mg by mouth 2 (two) times daily.   hydrALAZINE 10 MG tablet Commonly known as: APRESOLINE Take 1 tablet (10 mg total) by mouth 2 (two) times daily as needed.   lacosamide 200 MG Tabs tablet Commonly known as: VIMPAT Take 1 tablet (200 mg total) by mouth 2 (two) times daily.   latanoprost 0.005 % ophthalmic solution Commonly known as: XALATAN Place 1 drop into both eyes at bedtime.   levETIRAcetam 750 MG 24 hr tablet Commonly known as: KEPPRA XR Take 2,250 mg by mouth daily.   LORazepam 2 MG/ML concentrated solution Commonly known as: ATIVAN Take 0.5 mg by mouth as needed for anxiety.   miconazole 2 % powder Commonly known as: MICOTIN Apply 1 Application topically 2 (two) times daily.   phenytoin 50 MG tablet Commonly known as: DILANTIN Chew 75 mg by mouth daily.   polyethylene glycol 17 g packet Commonly known as: MIRALAX / GLYCOLAX Take 17 g by mouth every other day.   polyethylene glycol 17 g packet Commonly known as: MIRALAX / GLYCOLAX Take 17 g by mouth as needed.   sodium chloride 1 g tablet Take 2 g by mouth. Take 2 gm in the AM and 1 gm in the Pm   tamsulosin 0.4 MG Caps capsule Commonly known as: FLOMAX Take 0.4 mg by mouth daily.   triamcinolone cream 0.1 % Commonly known as: KENALOG Apply 1 Application topically 2 (two) times daily as needed.   Tums Smoothies  750 MG chewable tablet Generic drug: calcium carbonate Chew 2 tablets by mouth 4 (four) times daily as needed for heartburn.        Review of Systems  Unable to perform ROS: Dementia    Immunization History  Administered Date(s) Administered   Fluad Quad(high Dose 65+) 03/22/2022   Influenza, High Dose Seasonal PF 04/07/2020   Influenza, Seasonal, Injecte, Preservative Fre 02/22/2010   Influenza,inj,Quad PF,6+ Mos 04/07/2018, 04/15/2019   Influenza-Unspecified 03/17/2013, 02/24/2014, 02/16/2015, 03/21/2016, 03/17/2017, 04/07/2018, 03/21/2021   Moderna SARS-COV2 Booster Vaccination 01/11/2021, 03/28/2021   Moderna Sars-Covid-2 Vaccination 06/28/2019, 07/27/2019, 05/01/2020, 04/22/2022, 10/09/2022   Pneumococcal Conjugate-13 07/05/2013   Pneumococcal Polysaccharide-23 06/17/2006   Td 06/18/2007  Tdap 12/07/2015, 09/17/2020   Zoster Recombinant(Shingrix) 12/04/2016   Zoster, Live 10/06/2008   Pertinent  Health Maintenance Due  Topic Date Due   INFLUENZA VACCINE  01/16/2023      07/08/2022    9:52 AM 07/08/2022    3:03 PM 09/06/2022   10:18 AM 09/17/2022    3:12 PM 10/21/2022    1:58 PM  Fall Risk  Falls in the past year? 1 1 0 1 1  Was there an injury with Fall? 0 0 0 1 1  Fall Risk Category Calculator 1 1 0 3 3  Patient at Risk for Falls Due to History of fall(s) History of fall(s);Impaired balance/gait;Impaired mobility History of fall(s);Impaired balance/gait;Impaired mobility History of fall(s);Impaired mobility;Impaired balance/gait Impaired balance/gait;Impaired mobility;History of fall(s)  Fall risk Follow up Falls evaluation completed Falls evaluation completed Falls evaluation completed Falls evaluation completed Falls evaluation completed   Functional Status Survey:    Vitals:   02/18/23 1101  BP: (!) 146/60  Pulse: 60  Resp: 18  Temp: 97.9 F (36.6 C)  SpO2: 98%  Weight: 166 lb 3.2 oz (75.4 kg)  Height: 5\' 9"  (1.753 m)   Body mass index is 24.54  kg/m. Physical Exam Vitals reviewed.  Constitutional:      General: He is not in acute distress. HENT:     Head: Normocephalic.  Eyes:     General:        Right eye: No discharge.        Left eye: No discharge.  Cardiovascular:     Rate and Rhythm: Normal rate. Rhythm irregular.     Pulses: Normal pulses.     Heart sounds: Normal heart sounds.  Pulmonary:     Effort: Pulmonary effort is normal. No respiratory distress.     Breath sounds: Normal breath sounds. No wheezing.  Musculoskeletal:     Cervical back: Neck supple.  Skin:    Findings: Lesion present. No rash.     Comments: Approx 0.5 cm open lesion to left shin, granulation tissue to wound bed, no erythema/swelling or drainage.   Neurological:     General: No focal deficit present.     Mental Status: He is alert and oriented to person, place, and time.     Motor: Weakness present.     Gait: Gait abnormal.     Comments: walker  Psychiatric:        Mood and Affect: Mood normal.     Labs reviewed: Recent Labs    08/24/22 1447 08/26/22 0000 08/28/22 1345 08/30/22 0000 09/26/22 2224  NA 125* 126* 128* 130* 130*  K 4.3 4.7 4.2  --  3.9  CL 92* 95* 97* 99 99  CO2 25 17 25 21 24   GLUCOSE 102*  --  98  --  120*  BUN 14 11 10 8 15   CREATININE 0.73 0.6 0.62 0.6 0.78  CALCIUM 9.1 8.8 8.9 8.5* 8.4*  MG 1.7  --   --   --   --    Recent Labs    08/24/22 1447 08/28/22 1345 09/26/22 2224  AST 27 20 22   ALT 26 16 15   ALKPHOS 116 116 113  BILITOT 0.5 0.6 0.7  PROT 7.3 6.9 7.3  ALBUMIN 4.3 4.0 4.0   Recent Labs    08/24/22 1447 08/28/22 1345 09/26/22 2224  WBC 4.6 5.1 5.9  NEUTROABS 1.9 2.2 3.8  HGB 12.0* 11.0* 10.8*  HCT 32.3* 29.9* 31.6*  MCV 88.7 89.5 93.8  PLT 219  212 207   Lab Results  Component Value Date   TSH 2.685 01/19/2022   No results found for: "HGBA1C" Lab Results  Component Value Date   CHOL 214 (A) 06/24/2022   HDL 83 (A) 06/24/2022   LDLCALC 141 05/15/2021   TRIG 59 06/24/2022     Significant Diagnostic Results in last 30 days:  No results found.  Assessment/Plan 1. Open wound of left lower leg, initial encounter - h/o Grover's Disease - approx 0.5 cm open lesion to left shin> no sign of infection - start antibiotic ointment applications daily x 7 days - cover with gauze dressing daily - notify PCP if increased redness/tenderness/purulent drainage or pain    Family/ staff Communication: plan discussed with patient and nurse  Labs/tests ordered: none

## 2023-02-19 DIAGNOSIS — R278 Other lack of coordination: Secondary | ICD-10-CM | POA: Diagnosis not present

## 2023-02-19 DIAGNOSIS — R296 Repeated falls: Secondary | ICD-10-CM | POA: Diagnosis not present

## 2023-02-19 DIAGNOSIS — M6389 Disorders of muscle in diseases classified elsewhere, multiple sites: Secondary | ICD-10-CM | POA: Diagnosis not present

## 2023-02-19 DIAGNOSIS — G20A1 Parkinson's disease without dyskinesia, without mention of fluctuations: Secondary | ICD-10-CM | POA: Diagnosis not present

## 2023-02-21 DIAGNOSIS — G20A1 Parkinson's disease without dyskinesia, without mention of fluctuations: Secondary | ICD-10-CM | POA: Diagnosis not present

## 2023-02-21 DIAGNOSIS — R278 Other lack of coordination: Secondary | ICD-10-CM | POA: Diagnosis not present

## 2023-02-21 DIAGNOSIS — R296 Repeated falls: Secondary | ICD-10-CM | POA: Diagnosis not present

## 2023-02-21 DIAGNOSIS — M6389 Disorders of muscle in diseases classified elsewhere, multiple sites: Secondary | ICD-10-CM | POA: Diagnosis not present

## 2023-02-25 DIAGNOSIS — G20A1 Parkinson's disease without dyskinesia, without mention of fluctuations: Secondary | ICD-10-CM | POA: Diagnosis not present

## 2023-02-25 DIAGNOSIS — R296 Repeated falls: Secondary | ICD-10-CM | POA: Diagnosis not present

## 2023-02-25 DIAGNOSIS — R278 Other lack of coordination: Secondary | ICD-10-CM | POA: Diagnosis not present

## 2023-02-25 DIAGNOSIS — M6389 Disorders of muscle in diseases classified elsewhere, multiple sites: Secondary | ICD-10-CM | POA: Diagnosis not present

## 2023-02-27 DIAGNOSIS — M6389 Disorders of muscle in diseases classified elsewhere, multiple sites: Secondary | ICD-10-CM | POA: Diagnosis not present

## 2023-02-27 DIAGNOSIS — G20A1 Parkinson's disease without dyskinesia, without mention of fluctuations: Secondary | ICD-10-CM | POA: Diagnosis not present

## 2023-02-27 DIAGNOSIS — R296 Repeated falls: Secondary | ICD-10-CM | POA: Diagnosis not present

## 2023-02-27 DIAGNOSIS — R278 Other lack of coordination: Secondary | ICD-10-CM | POA: Diagnosis not present

## 2023-03-03 ENCOUNTER — Non-Acute Institutional Stay: Payer: Self-pay | Admitting: Adult Health

## 2023-03-03 ENCOUNTER — Encounter: Payer: Self-pay | Admitting: Adult Health

## 2023-03-03 DIAGNOSIS — M6389 Disorders of muscle in diseases classified elsewhere, multiple sites: Secondary | ICD-10-CM | POA: Diagnosis not present

## 2023-03-03 DIAGNOSIS — G40909 Epilepsy, unspecified, not intractable, without status epilepticus: Secondary | ICD-10-CM | POA: Diagnosis not present

## 2023-03-03 DIAGNOSIS — R278 Other lack of coordination: Secondary | ICD-10-CM | POA: Diagnosis not present

## 2023-03-03 DIAGNOSIS — R350 Frequency of micturition: Secondary | ICD-10-CM | POA: Diagnosis not present

## 2023-03-03 DIAGNOSIS — I482 Chronic atrial fibrillation, unspecified: Secondary | ICD-10-CM | POA: Diagnosis not present

## 2023-03-03 DIAGNOSIS — R269 Unspecified abnormalities of gait and mobility: Secondary | ICD-10-CM | POA: Diagnosis not present

## 2023-03-03 DIAGNOSIS — E871 Hypo-osmolality and hyponatremia: Secondary | ICD-10-CM | POA: Diagnosis not present

## 2023-03-03 DIAGNOSIS — N401 Enlarged prostate with lower urinary tract symptoms: Secondary | ICD-10-CM

## 2023-03-03 DIAGNOSIS — R296 Repeated falls: Secondary | ICD-10-CM | POA: Diagnosis not present

## 2023-03-03 DIAGNOSIS — G20A1 Parkinson's disease without dyskinesia, without mention of fluctuations: Secondary | ICD-10-CM | POA: Diagnosis not present

## 2023-03-03 NOTE — Progress Notes (Addendum)
Location:  [facility] Room Number: 536A Place of Service:  SNF [phone]) Provider:  Tamsen Roers, MD  Patient Care Team: Mahlon Gammon, MD as PCP - General (Internal Medicine) Thurmon Fair, MD as PCP - Cardiology (Cardiology) Jethro Bolus, MD as Consulting Physician (Ophthalmology) [name], MD (Inactive) as Consulting Physician (Neurology) Kermit Balo, DO (Geriatric Medicine)  Extended Emergency Contact Information Primary Emergency Contact: Hibler,[name] Address: [address]          [address] [place], [place] [address] Darden Amber of [place] Home Phone: [phone] Mobile Phone: [phone] Relation: Spouse Secondary Emergency Contact: Christiana Fuchs Home Phone: [phone] Mobile Phone: [phone] Relation: Son  Code Status:  DNR Goals of care: Advanced Directive information    03/03/2023    9:31 AM  Advanced Directives  Does Patient Have a Medical Advance Directive? Yes  Type of Advance Directive Out of facility DNR (pink MOST or yellow form);Living will  Does patient want to make changes to medical advance directive? No - Patient declined     Chief Complaint  Patient presents with   Acute Visit    Patient is being seen for acute weakness   Immunizations    Patient is due for covid, shingles, and flu vaccine    Quality Metric Gaps    Patient is due for [name]    HPI:  Pt is a 87 y.o. male seen today for an acute visit for weakness.  PMH: afib off coumadin due to falls, seizures, right temporal lobe resection, vagus nerve stimulator, SIADH, HTN, Grover's disease, Cardiac arrest with defibrillator, HLD, and BPH  The nurse reports he had two instances of dizziness following bowel movements. He declined straining while on the toilet and takes miralax.  He has been feeling weaker and requests that his sodium be checked.   He reports no recent seizure activity. He has an apt to see neurology 9/19.   No falls but has an extensive fall hx with gait issues and is not on DOAC for afib due to this  Sodium tends to run low with hx of SIADH. He takes sodium tablets but the cause him to have a sore throat so he is taking them with pudding.   Chronic hx of orthostatic hypotension as well BPH on Proscar and FLomax: denies urinary issues  Has some memory problems and behavior issues.  MMSE 30/30    Past Medical History:  Diagnosis Date   Abnormality of gait 12/21/2013   Atrial fibrillation (HCC)    Blind left eye    Carcinoma in situ of prostate    Cardiac arrest (HCC)    Glaucoma    Hypercholesteremia    Hyperlipidemia    Metabolic encephalopathy    Mild left ventricular systolic dysfunction    Presence of other specified functional implants    PSA elevation    Right frontal lobe lesion    Seizures (HCC)    Tinea pedis    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grover)    Urinary urgency    Past Surgical History:  Procedure Laterality Date   CARDIAC DEFIBRILLATOR PLACEMENT     cataract surgery     COLONOSCOPY     CRANIOTOMY Right    right anterior temporal resection   defibrillator replaced  March 2016   IMPLANTATION VAGAL NERVE STIMULATOR  June 2016   NASAL SINUS SURGERY     TONSILLECTOMY     age 70   VASECTOMY  32  Allergies  Allergen Reactions   Depakote [Divalproex Sodium] Other (See Comments)    Arthralgias     Outpatient Encounter Medications as of 03/03/2023  Medication Sig   acetaminophen (TYLENOL) 325 MG tablet Take 650 mg by mouth every 6 (six) hours as needed for mild pain or moderate pain.   ALPRAZolam (XANAX) 0.25 MG tablet Take 1 tablet (0.25 mg total) by mouth 2 (two) times daily as needed for anxiety.   brimonidine-timolol (COMBIGAN) 0.2-0.5 % ophthalmic solution Place 1 drop into both eyes every 12 (twelve) hours.    calcium carbonate (TUMS SMOOTHIES) 750 MG chewable tablet Chew 2 tablets by mouth 4 (four) times daily as needed for heartburn.    camphor-menthol (SARNA) lotion Apply 1 Application topically every 8 (eight) hours as needed for itching.   cetirizine (ZYRTEC) 10 MG tablet Take 10 mg by mouth as needed for allergies.   cholecalciferol (VITAMIN D) 400 units TABS tablet Take 400 Units by mouth 2 (two) times daily.   clobetasol cream (TEMOVATE) 0.05 % Apply 1 application  topically 2 (two) times daily as needed (rash).   DILANTIN 100 MG ER capsule Take 300 mg by mouth. In the evening   econazole nitrate 1 % cream Apply 1 Application topically 2 (two) times daily as needed.   finasteride (PROSCAR) 5 MG tablet Take 5 mg by mouth every morning.   flecainide (TAMBOCOR) 50 MG tablet Take 50 mg by mouth 2 (two) times daily.   hydrALAZINE (APRESOLINE) 10 MG tablet Take 1 tablet (10 mg total) by mouth 2 (two) times daily as needed.   lacosamide (VIMPAT) 200 MG TABS tablet Take 1 tablet (200 mg total) by mouth 2 (two) times daily.   latanoprost (XALATAN) 0.005 % ophthalmic solution Place 1 drop into both eyes at bedtime.   Levetiracetam 750 MG TB24 Take 2,250 mg by mouth daily.   LORazepam (ATIVAN) 2 MG/ML concentrated solution Take 0.5 mg by mouth as needed for anxiety.   miconazole (MICOTIN) 2 % powder Apply 1 Application topically 2 (two) times daily.   phenytoin (DILANTIN) 50 MG tablet Chew 75 mg by mouth daily.   polyethylene glycol (MIRALAX / GLYCOLAX) 17 g packet Take 17 g by mouth as needed.   polyethylene glycol (MIRALAX / GLYCOLAX) packet Take 17 g by mouth every other day.   sodium chloride 1 g tablet Take 2 g by mouth. Take 2 gm in the AM and 1 gm in the Pm   tamsulosin (FLOMAX) 0.4 MG CAPS capsule Take 0.4 mg by mouth daily.   triamcinolone cream (KENALOG) 0.1 % Apply 1 Application topically 2 (two) times daily as needed.   No facility-administered encounter medications on file as of 03/03/2023.    Review of Systems  Constitutional:  Negative for activity change, appetite change, chills, diaphoresis, fatigue, fever and  unexpected weight change.  Respiratory:  Negative for cough, shortness of breath, wheezing and stridor.   Cardiovascular:  Positive for leg swelling. Negative for chest pain and palpitations.  Gastrointestinal:  Negative for abdominal distention, abdominal pain, constipation and diarrhea.  Genitourinary:  Negative for difficulty urinating and dysuria.  Musculoskeletal:  Positive for gait problem. Negative for arthralgias, back pain, joint swelling and myalgias.  Neurological:  Positive for weakness. Negative for dizziness, seizures, syncope, facial asymmetry, speech difficulty and headaches.  Hematological:  Negative for adenopathy. Does not bruise/bleed easily.  Psychiatric/Behavioral:  Negative for agitation, behavioral problems and confusion.     Immunization History  Administered Date(s) Administered  Fluad Quad(high Dose 65+) 03/22/2022   Influenza, High Dose Seasonal PF 04/07/2020   Influenza, Seasonal, Injecte, Preservative Fre 02/22/2010   Influenza,inj,Quad PF,6+ Mos 04/07/2018, 04/15/2019   Influenza-Unspecified 03/17/2013, 02/24/2014, 02/16/2015, 03/21/2016, 03/17/2017, 04/07/2018, 03/21/2021   Moderna SARS-COV2 Booster Vaccination 01/11/2021, 03/28/2021   Moderna Sars-Covid-2 Vaccination 06/28/2019, 07/27/2019, 05/01/2020, 04/22/2022, 10/09/2022   Pneumococcal Conjugate-13 07/05/2013   Pneumococcal Polysaccharide-23 06/17/2006   Td 06/18/2007   Tdap 12/07/2015, 09/17/2020   Zoster Recombinant(Shingrix) 12/04/2016   Zoster, Live 10/06/2008   Pertinent  Health Maintenance Due  Topic Date Due   INFLUENZA VACCINE  01/16/2023      07/08/2022    3:03 PM 09/06/2022   10:18 AM 09/17/2022    3:12 PM 10/21/2022    1:58 PM 03/03/2023    9:30 AM  Fall Risk  Falls in the past year? 1 0 1 1 0  Was there an injury with Fall? 0 0 1 1 0  Fall Risk Category Calculator 1 0 3 3 0  Patient at Risk for Falls Due to History of fall(s);Impaired balance/gait;Impaired mobility History of  fall(s);Impaired balance/gait;Impaired mobility History of fall(s);Impaired mobility;Impaired balance/gait Impaired balance/gait;Impaired mobility;History of fall(s) No Fall Risks  Fall risk Follow up Falls evaluation completed Falls evaluation completed Falls evaluation completed Falls evaluation completed    Functional Status Survey:    Vitals:   03/03/23 0926  BP: (!) 152/68  Pulse: 65  Resp: 18  Temp: (!) 97.2 F (36.2 C)  TempSrc: Temporal  SpO2: 96%  Weight: 166 lb 3.2 oz (75.4 kg)  Height: 5\' 9"  (1.753 m)   Body mass index is 24.54 kg/m. Physical Exam Vitals and nursing note reviewed.  Constitutional:      General: He is not in acute distress.    Appearance: He is not diaphoretic.  HENT:     Head: Normocephalic and atraumatic.     Right Ear: Tympanic membrane normal.     Left Ear: Tympanic membrane normal.     Nose: Nose normal.     Mouth/Throat:     Mouth: Mucous membranes are moist.     Pharynx: Oropharynx is clear.  Eyes:     Conjunctiva/sclera: Conjunctivae normal.     Pupils: Pupils are equal, round, and reactive to light.  Neck:     Thyroid: No thyromegaly.     Vascular: No JVD.     Trachea: No tracheal deviation.  Cardiovascular:     Rate and Rhythm: Normal rate. Rhythm irregular.     Heart sounds: No murmur heard. Pulmonary:     Effort: Pulmonary effort is normal. No respiratory distress.     Breath sounds: Normal breath sounds. No wheezing.  Abdominal:     General: Bowel sounds are normal. There is no distension.     Palpations: Abdomen is soft.     Tenderness: There is no abdominal tenderness.  Musculoskeletal:     Right lower leg: No edema.     Left lower leg: No edema.  Lymphadenopathy:     Cervical: No cervical adenopathy.  Skin:    General: Skin is warm and dry.     Comments: Left shin round erythematous scaly lesion Right cheek round erythematous scaly lesion   Neurological:     General: No focal deficit present.     Mental Status: He  is alert and oriented to person, place, and time. Mental status is at baseline.     Cranial Nerves: No cranial nerve deficit.  Psychiatric:  Mood and Affect: Mood normal.     Labs reviewed: Recent Labs    08/24/22 1447 08/26/22 0000 08/28/22 1345 08/30/22 0000 09/26/22 2224  NA 125* 126* 128* 130* 130*  K 4.3 4.7 4.2  --  3.9  CL 92* 95* 97* 99 99  CO2 25 17 25 21 24   GLUCOSE 102*  --  98  --  120*  BUN 14 11 10 8 15   CREATININE 0.73 0.6 0.62 0.6 0.78  CALCIUM 9.1 8.8 8.9 8.5* 8.4*  MG 1.7  --   --   --   --    Recent Labs    08/24/22 1447 08/28/22 1345 09/26/22 2224  AST 27 20 22   ALT 26 16 15   ALKPHOS 116 116 113  BILITOT 0.5 0.6 0.7  PROT 7.3 6.9 7.3  ALBUMIN 4.3 4.0 4.0   Recent Labs    08/24/22 1447 08/28/22 1345 09/26/22 2224  WBC 4.6 5.1 5.9  NEUTROABS 1.9 2.2 3.8  HGB 12.0* 11.0* 10.8*  HCT 32.3* 29.9* 31.6*  MCV 88.7 89.5 93.8  PLT 219 212 207   Lab Results  Component Value Date   TSH 2.685 01/19/2022   No results found for: "HGBA1C" Lab Results  Component Value Date   CHOL 214 (A) 06/24/2022   HDL 83 (A) 06/24/2022   LDLCALC 141 05/15/2021   TRIG 59 06/24/2022    Significant Diagnostic Results in last 30 days:  No results found.  Assessment/Plan  1. Seizure disorder (HCC) No new seizures Continue current meds and f/u with neurology Labs ordered as below  2. Hyponatremia Will check sodium due to perceived weakness, seems to be back to baseline.   3. Benign prostatic hyperplasia with urinary frequency Continue Proscar and Flomax.   4. Abnormality of gait Chronic gait issues and fall risk Must have walker at all times.   5. Chronic atrial fibrillation Rate is controlled Not on DOAC due to falls  6. Constipation Continue miralax.   Transient weakness and dizziness but seems to be back to baseline, will check labs   Family/ staff Communication: nurse and resident.   Labs/tests ordered:  CBC CMP TSH Dilantin level  in am  Flu and covid vaccine will be given at [facility] to f/u regarding missed remote transmission check   Total time :  time greater than 50% of total time spent doing pt counseling and coordination of care

## 2023-03-04 DIAGNOSIS — G40919 Epilepsy, unspecified, intractable, without status epilepticus: Secondary | ICD-10-CM | POA: Diagnosis not present

## 2023-03-04 DIAGNOSIS — Z79899 Other long term (current) drug therapy: Secondary | ICD-10-CM | POA: Diagnosis not present

## 2023-03-04 LAB — BASIC METABOLIC PANEL
BUN: 13 (ref 4–21)
CO2: 23 — AB (ref 13–22)
Chloride: 98 — AB (ref 99–108)
Creatinine: 0.8 (ref 0.6–1.3)
Glucose: 92
Potassium: 4.6 meq/L (ref 3.5–5.1)
Sodium: 135 — AB (ref 137–147)

## 2023-03-04 LAB — HEPATIC FUNCTION PANEL
ALT: 18 U/L (ref 10–40)
AST: 25 (ref 14–40)
Alkaline Phosphatase: 148 — AB (ref 25–125)
Bilirubin, Total: 0.3

## 2023-03-04 LAB — CBC AND DIFFERENTIAL
HCT: 35 — AB (ref 41–53)
Hemoglobin: 12.3 — AB (ref 13.5–17.5)
Platelets: 226 10*3/uL (ref 150–400)
WBC: 6.1

## 2023-03-04 LAB — COMPREHENSIVE METABOLIC PANEL
Albumin: 4.2 (ref 3.5–5.0)
Calcium: 8.8 (ref 8.7–10.7)
Globulin: 135
eGFR: 87

## 2023-03-04 LAB — CBC: RBC: 3.74 — AB (ref 3.87–5.11)

## 2023-03-05 DIAGNOSIS — L570 Actinic keratosis: Secondary | ICD-10-CM | POA: Diagnosis not present

## 2023-03-05 DIAGNOSIS — L111 Transient acantholytic dermatosis [Grover]: Secondary | ICD-10-CM | POA: Diagnosis not present

## 2023-03-06 ENCOUNTER — Ambulatory Visit (INDEPENDENT_AMBULATORY_CARE_PROVIDER_SITE_OTHER): Payer: Medicare Other | Admitting: Neurology

## 2023-03-06 ENCOUNTER — Encounter: Payer: Self-pay | Admitting: Neurology

## 2023-03-06 VITALS — BP 128/82 | Ht 68.0 in | Wt 169.0 lb

## 2023-03-06 DIAGNOSIS — R269 Unspecified abnormalities of gait and mobility: Secondary | ICD-10-CM

## 2023-03-06 DIAGNOSIS — Z9689 Presence of other specified functional implants: Secondary | ICD-10-CM | POA: Diagnosis not present

## 2023-03-06 DIAGNOSIS — G40211 Localization-related (focal) (partial) symptomatic epilepsy and epileptic syndromes with complex partial seizures, intractable, with status epilepticus: Secondary | ICD-10-CM

## 2023-03-06 NOTE — Progress Notes (Signed)
Reason for visit: Intractable seizures, gait disorder  Cameron Potts is an 87 y.o. male  Interval History 03/06/2023:  Cameron Potts presents today for follow-up, he is accompanied by his son.  Last visit was in March since then she has not had any seizure or seizure activity.  He is compliant with her antiseizure medications including phenytoin, Keppra and Vimpat.  He continues to swipe his VNS at least twice a day, he reports lately he has trouble on taking his Keppra, feel like he is choking, nurses at the facility are helping him with taking his medication with custard.  No other complaints.   Interval history 09/03/2022:  Cameron Potts presents today for follow-up, he is accompanied by grandson.  Last visit was in November.  Since then he has not had any seizure or seizure-like activity.  He continues to do well.  He swipes VNS twice a day at least, no adverse reaction.  He is compliant with his medication no side effects   Interval History 05/13/2022: Patient presents today for follow-up, he is accompanied by wife.  Since last visit in August, he had 1 breakthrough seizure in September.  At that time, we had tried him on clobazam but he could not tolerate due to side effects of somnolence and confusion.  Therefore clobazam has been discontinued.  He is currently on Vimpat, Dilantin, and Keppra.  He could not tolerate higher dose of VNS change due to cough and hoarseness.  He has not had any additional seizures.  He reports that he is back to his baseline.   Interval History 02/11/2022:  Cameron Potts presents today for follow-up, he is accompanied by his wife. Since last visit in March, he has been doing well, denies any seizures, denies any side effect from the medications.  He still have balance issue and had fallen in the month of July.  He reported he bumped his head very slightly.  3 days later he started getting worse, having confusion and was taken to the ED. His head CT did not show  any bleeding but his sodium was noted to be 122.  He does have chronic hyponatremia, he was seen by nephrology in the hospital.  He was told that hyponatremia was secondary to cerebral salt wasting.  He was treated and on discharge his sodium level was back to normal at 136.  In the hospital his antiseizure medications level were checked also and there were all within normal limits.  Since discharge from the hospital, he continues to do well.  He reported he is back to his normal self now.     Interval history 09/13/2021:  Patient presents today for follow-up, she is accompanied by his wife.  Last visit with Dr. Anne Hahn was in September, at that time plan was to continue current medications.  He is known to have breakthrough seizure, and wife report that the last breakthrough seizure was 6 weeks ago.  He does have Ativan currently at the facility and usually gets it as soon as the seizure started because he is known to go into status per wife.  Ativan use to stop the seizures   History of present illness: Cameron Potts is an 87 year old right-handed white male with a history of intractable seizures.  He currently is on Dilantin, Vimpat, and Keppra and he continues to have breakthrough seizures.  He last had a brief event that occurred 2 weeks ago.  He had a fall associated with this.  He had  a another seizure about a month prior to this.  He has Ativan that he receives as needed at his extended care facility.  His most recent Dilantin level on 12 March 2021 was 18.0.  The patient is also on Coumadin.  He walks with a walker.  He is getting physical therapy on a regular basis.  He has had a general decline in his balance according to his wife and according to the patient.  He has a vagal nerve stimulator in place, this was inserted in 2013.  He tolerates this fairly well.   Past Medical History:  Diagnosis Date   Abnormality of gait 12/21/2013   Atrial fibrillation (HCC)    Blind left eye    Carcinoma  in situ of prostate    Cardiac arrest (HCC)    Glaucoma    Hypercholesteremia    Hyperlipidemia    Metabolic encephalopathy    Mild left ventricular systolic dysfunction    Orthostatic hypotension    Presence of other specified functional implants    PSA elevation    Right frontal lobe lesion    Seizures (HCC)    Tinea pedis    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grover)    Urinary urgency     Past Surgical History:  Procedure Laterality Date   CARDIAC DEFIBRILLATOR PLACEMENT     cataract surgery     COLONOSCOPY     CRANIOTOMY Right    right anterior temporal resection   defibrillator replaced  March 2016   IMPLANTATION VAGAL NERVE STIMULATOR  June 2016   NASAL SINUS SURGERY     TONSILLECTOMY     age 87   VASECTOMY  64    Family History  Problem Relation Age of Onset   Cancer - Lung Mother    Cancer - Prostate Father     Social history:  reports that he has never smoked. He has never used smokeless tobacco. He reports that he does not currently use alcohol. He reports that he does not use drugs.    Allergies  Allergen Reactions   Depakote [Divalproex Sodium] Other (See Comments)    Arthralgias     Medications:  Current Meds  Medication Sig   acetaminophen (TYLENOL) 325 MG tablet Take 650 mg by mouth every 6 (six) hours as needed for mild pain or moderate pain.   ALPRAZolam (XANAX) 0.25 MG tablet Take 1 tablet (0.25 mg total) by mouth 2 (two) times daily as needed for anxiety.   brimonidine-timolol (COMBIGAN) 0.2-0.5 % ophthalmic solution Place 1 drop into both eyes every 12 (twelve) hours.    calcium carbonate (TUMS SMOOTHIES) 750 MG chewable tablet Chew 2 tablets by mouth 4 (four) times daily as needed for heartburn.   camphor-menthol (SARNA) lotion Apply 1 Application topically every 8 (eight) hours as needed for itching.   cetirizine (ZYRTEC) 10 MG tablet Take 10 mg by mouth as needed for allergies.   cholecalciferol (VITAMIN D) 400 units TABS tablet Take  400 Units by mouth 2 (two) times daily.   clobetasol cream (TEMOVATE) 0.05 % Apply 1 application  topically 2 (two) times daily as needed (rash).   DILANTIN 100 MG ER capsule Take 300 mg by mouth. In the evening   econazole nitrate 1 % cream Apply 1 Application topically 2 (two) times daily as needed.   finasteride (PROSCAR) 5 MG tablet Take 5 mg by mouth every morning.   flecainide (TAMBOCOR) 50 MG tablet Take 50 mg by mouth 2 (two) times daily.  hydrALAZINE (APRESOLINE) 10 MG tablet Take 1 tablet (10 mg total) by mouth 2 (two) times daily as needed.   lacosamide (VIMPAT) 200 MG TABS tablet Take 1 tablet (200 mg total) by mouth 2 (two) times daily.   latanoprost (XALATAN) 0.005 % ophthalmic solution Place 1 drop into both eyes at bedtime.   Levetiracetam 750 MG TB24 Take 2,250 mg by mouth daily.   LORazepam (ATIVAN) 2 MG/ML concentrated solution Take 0.5 mg by mouth as needed for anxiety.   miconazole (MICOTIN) 2 % powder Apply 1 Application topically 2 (two) times daily.   phenytoin (DILANTIN) 50 MG tablet Chew 75 mg by mouth daily.   polyethylene glycol (MIRALAX / GLYCOLAX) 17 g packet Take 17 g by mouth as needed.   polyethylene glycol (MIRALAX / GLYCOLAX) packet Take 17 g by mouth every other day.   sodium chloride 1 g tablet Take 2 g by mouth. Take 2 gm in the AM and 1 gm in the Pm   tamsulosin (FLOMAX) 0.4 MG CAPS capsule Take 0.4 mg by mouth daily.   triamcinolone cream (KENALOG) 0.1 % Apply 1 Application topically 2 (two) times daily as needed.      ROS: Out of a complete 14 system review of symptoms, the patient complains only of the following symptoms, and all other reviewed systems are negative.  Walking difficulty Seizures  Blood pressure 128/82, height 5\' 8"  (1.727 m), weight 169 lb (76.7 kg).   Physical Exam  General: The patient is alert and cooperative at the time of the examination.  Skin: No significant peripheral edema is noted.   Neurologic Exam  Mental  status: The patient is alert and oriented x 3 at the time of the examination. The patient has apparent normal recent and remote memory, with an apparently normal attention span and concentration ability.  Cranial nerves: Facial symmetry is present. Speech is normal, no aphasia or dysarthria is noted. Extraocular movements are full. Visual fields are full. Decrease facial movements.   Motor: The patient has good strength in all 4 extremities.  Sensory examination: Soft touch sensation is symmetric on the face, arms, and legs.  Coordination: The patient has good finger-nose-finger and heel-to-shin bilaterally.  Gait and station: The patient has a somewhat wide-based gait, the patient walks with a walker.  With the walker, he has good stride and turns, good stability.  Tandem gait was not attempted.  Romberg is positive, he tends to fall backwards.   Assessment/Plan:  1.  Intractable seizures s/p VNS placement  2.  Gait disorder  He is stable on VNS and 3 antiepileptic medications.  I interrogated his VNS and made changes. He tolerated the procedure well. Continue with current medications including Vimpat 200 mg BID, Keppra XR 2250 mg by mouth daily and Dilantin 75 mg morning and 300 mg PM. He continues to get physical therapy at the facility.  I will see him in 6 months or sooner if worse    Patient Instructions  Continue with current antiseizure medications Keppra extended release 2250 nightly Dilantin 75 mg in the morning and 300 mg in the evening Vimpat 200 mg twice daily VNS interrogated today and changes made, patient tolerated procedure well Continue to follow-up PCP and return in 6 months or sooner if worse.    Windell Norfolk, MD 03/07/2023 10:01 AM  [facility] [address] [place], [place] [address]  Phone [phone] Fax [phone]

## 2023-03-07 NOTE — Patient Instructions (Signed)
Continue with current antiseizure medications Keppra extended release 2250 nightly Dilantin 75 mg in the morning and 300 mg in the evening Vimpat 200 mg twice daily VNS interrogated today and changes made, patient tolerated procedure well Continue to follow-up PCP and return in 6 months or sooner if worse.

## 2023-03-19 DIAGNOSIS — R351 Nocturia: Secondary | ICD-10-CM | POA: Diagnosis not present

## 2023-03-19 DIAGNOSIS — N401 Enlarged prostate with lower urinary tract symptoms: Secondary | ICD-10-CM | POA: Diagnosis not present

## 2023-03-20 DIAGNOSIS — D649 Anemia, unspecified: Secondary | ICD-10-CM | POA: Diagnosis not present

## 2023-03-20 DIAGNOSIS — R569 Unspecified convulsions: Secondary | ICD-10-CM | POA: Diagnosis not present

## 2023-03-20 DIAGNOSIS — E871 Hypo-osmolality and hyponatremia: Secondary | ICD-10-CM | POA: Diagnosis not present

## 2023-03-24 ENCOUNTER — Encounter: Payer: Self-pay | Admitting: Adult Health

## 2023-03-24 ENCOUNTER — Non-Acute Institutional Stay: Payer: Medicare Other | Admitting: Adult Health

## 2023-03-24 ENCOUNTER — Telehealth: Payer: Self-pay

## 2023-03-24 VITALS — BP 118/78 | HR 62 | Temp 97.6°F | Resp 16 | Ht 68.0 in | Wt 171.2 lb

## 2023-03-24 DIAGNOSIS — I951 Orthostatic hypotension: Secondary | ICD-10-CM

## 2023-03-24 DIAGNOSIS — R350 Frequency of micturition: Secondary | ICD-10-CM | POA: Diagnosis not present

## 2023-03-24 DIAGNOSIS — E871 Hypo-osmolality and hyponatremia: Secondary | ICD-10-CM

## 2023-03-24 DIAGNOSIS — G20C Parkinsonism, unspecified: Secondary | ICD-10-CM

## 2023-03-24 DIAGNOSIS — L111 Transient acantholytic dermatosis [Grover]: Secondary | ICD-10-CM

## 2023-03-24 DIAGNOSIS — G40909 Epilepsy, unspecified, not intractable, without status epilepticus: Secondary | ICD-10-CM | POA: Diagnosis not present

## 2023-03-24 DIAGNOSIS — R200 Anesthesia of skin: Secondary | ICD-10-CM | POA: Diagnosis not present

## 2023-03-24 DIAGNOSIS — N401 Enlarged prostate with lower urinary tract symptoms: Secondary | ICD-10-CM | POA: Diagnosis not present

## 2023-03-24 DIAGNOSIS — I482 Chronic atrial fibrillation, unspecified: Secondary | ICD-10-CM

## 2023-03-24 NOTE — Progress Notes (Signed)
Location:  [facility]  POS: Clinic  Provider: Fletcher Anon, ANP  Code Status: DNR Goals of Care:     03/24/2023    2:10 PM  Advanced Directives  Does Patient Have a Medical Advance Directive? Yes  Type of Advance Directive Out of facility DNR (pink MOST or yellow form);Living will  Does patient want to make changes to medical advance directive? No - Patient declined     Chief Complaint  Patient presents with   Medical Management of Chronic Issues    Patient is being seen for follow up. Patient would like to discuss a weak arm     HPI: Patient is a 87 y.o. male seen today for medical management of chronic diseases.   PMH: afib off coumadin due to falls, seizures, right temporal lobe resection, vagus nerve stimulator, SIADH, HTN, Grover's disease, Cardiac arrest with defibrillator, HLD, and BPH Reports some mild tingling in the left hand at the thumb and index finger and mild swelling. No known injury. Did sleep "funny" one day.  At first reported weakness but says now that he can move it. No speech issues, dizziness, etc.   Has orthostatic hypotension. No new issues. BP is labile at times. Ranging 110-170s systolic.   Hyponatremia on salt tablets NA 135 Seizures: no new seizures. Well controlled on keppra, vimpat, and dilantin.  Last level was slightly elevated per epic notes. As VNS which was interrogated by neurology with changes made 03/06/23.  Has issues with recurrent falls due to gait instability and poor safety awareness. Has a walker.   BPH: has urinary frequency, currently on flomax and proscar. Reports he saw neurology with no new orders.   Afib: HR controlled on flecainide. Not on coumadin due to falls. Has defibrillator, device check scheduled for Dec.  Past Medical History:  Diagnosis Date   Abnormality of gait 12/21/2013   Atrial fibrillation (HCC)    Blind left eye    Carcinoma in situ of prostate    Cardiac arrest (HCC)    Glaucoma     Hypercholesteremia    Hyperlipidemia    Metabolic encephalopathy    Mild left ventricular systolic dysfunction    Orthostatic hypotension    Presence of other specified functional implants    PSA elevation    Right frontal lobe lesion    Seizures (HCC)    Tinea pedis    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grover)    Urinary urgency     Past Surgical History:  Procedure Laterality Date   CARDIAC DEFIBRILLATOR PLACEMENT     cataract surgery     COLONOSCOPY     CRANIOTOMY Right    right anterior temporal resection   defibrillator replaced  March 2016   IMPLANTATION VAGAL NERVE STIMULATOR  June 2016   NASAL SINUS SURGERY     TONSILLECTOMY     age 35   VASECTOMY  24    Allergies  Allergen Reactions   Depakote [Divalproex Sodium] Other (See Comments)    Arthralgias     Outpatient Encounter Medications as of 03/24/2023  Medication Sig   acetaminophen (TYLENOL) 325 MG tablet Take 650 mg by mouth every 6 (six) hours as needed for mild pain or moderate pain.   ALPRAZolam (XANAX) 0.25 MG tablet Take 1 tablet (0.25 mg total) by mouth 2 (two) times daily as needed for anxiety.   brimonidine-timolol (COMBIGAN) 0.2-0.5 % ophthalmic solution Place 1 drop into both eyes every 12 (twelve) hours.    calcium carbonate (TUMS SMOOTHIES)  750 MG chewable tablet Chew 2 tablets by mouth 4 (four) times daily as needed for heartburn.   camphor-menthol (SARNA) lotion Apply 1 Application topically every 8 (eight) hours as needed for itching.   cetirizine (ZYRTEC) 10 MG tablet Take 10 mg by mouth as needed for allergies.   cholecalciferol (VITAMIN D) 400 units TABS tablet Take 400 Units by mouth 2 (two) times daily.   clobetasol cream (TEMOVATE) 0.05 % Apply 1 application  topically 2 (two) times daily as needed (rash).   DILANTIN 100 MG ER capsule Take 300 mg by mouth. In the evening   econazole nitrate 1 % cream Apply 1 Application topically 2 (two) times daily as needed.   finasteride (PROSCAR) 5  MG tablet Take 5 mg by mouth every morning.   flecainide (TAMBOCOR) 50 MG tablet Take 50 mg by mouth 2 (two) times daily.   hydrALAZINE (APRESOLINE) 10 MG tablet Take 1 tablet (10 mg total) by mouth 2 (two) times daily as needed.   lacosamide (VIMPAT) 200 MG TABS tablet Take 1 tablet (200 mg total) by mouth 2 (two) times daily.   latanoprost (XALATAN) 0.005 % ophthalmic solution Place 1 drop into both eyes at bedtime.   Levetiracetam 750 MG TB24 Take 2,250 mg by mouth daily.   LORazepam (ATIVAN) 2 MG/ML concentrated solution Take 0.5 mg by mouth as needed for anxiety.   miconazole (MICOTIN) 2 % powder Apply 1 Application topically 2 (two) times daily.   phenytoin (DILANTIN) 50 MG tablet Chew 75 mg by mouth daily.   polyethylene glycol (MIRALAX / GLYCOLAX) 17 g packet Take 17 g by mouth as needed.   polyethylene glycol (MIRALAX / GLYCOLAX) packet Take 17 g by mouth every other day.   sodium chloride 1 g tablet Take 2 g by mouth. Take 2 gm in the AM and 1 gm in the Pm   tamsulosin (FLOMAX) 0.4 MG CAPS capsule Take 0.4 mg by mouth daily.   triamcinolone cream (KENALOG) 0.1 % Apply 1 Application topically 2 (two) times daily as needed.   No facility-administered encounter medications on file as of 03/24/2023.    Review of Systems:  Review of Systems  Constitutional:  Negative for activity change, appetite change, chills, diaphoresis, fatigue, fever and unexpected weight change.  Respiratory:  Negative for cough, shortness of breath, wheezing and stridor.   Cardiovascular:  Positive for leg swelling. Negative for chest pain and palpitations.  Gastrointestinal:  Negative for abdominal distention, abdominal pain, constipation and diarrhea.  Genitourinary:  Negative for difficulty urinating and dysuria.  Musculoskeletal:  Positive for gait problem. Negative for arthralgias, back pain, joint swelling, myalgias, neck pain and neck stiffness.  Neurological:  Positive for numbness. Negative for  dizziness, seizures, syncope, facial asymmetry, speech difficulty, weakness and headaches.  Hematological:  Negative for adenopathy. Does not bruise/bleed easily.  Psychiatric/Behavioral:  Negative for agitation, behavioral problems and confusion.     Health Maintenance  Topic Date Due   Zoster Vaccines- Shingrix (2 of 2) 01/29/2017   INFLUENZA VACCINE  01/16/2023   COVID-19 Vaccine (6 - 2023-24 season) 02/16/2023   Medicare Annual Wellness ([name])  04/02/2023 (Originally 06/14/2020)   DTaP/Tdap/Td (4 - Td or Tdap) 09/18/2030   Pneumonia Vaccine 41+ Years old  Completed   HPV VACCINES  Aged Out    Physical Exam: Vitals:   03/24/23 1408  BP: 118/78  Pulse: 62  Resp: 16  Temp: 97.6 F (36.4 C)  TempSrc: Temporal  SpO2: 99%  Weight: 171 lb  3.2 oz (77.7 kg)  Height: 5\' 8"  (1.727 m)   Body mass index is 26.03 kg/m. Physical Exam Vitals and nursing note reviewed.  Constitutional:      General: He is not in acute distress.    Appearance: He is not diaphoretic.  HENT:     Head: Normocephalic and atraumatic.     Right Ear: Tympanic membrane normal.     Left Ear: Tympanic membrane normal.     Nose: Nose normal.     Mouth/Throat:     Mouth: Mucous membranes are moist.     Pharynx: Oropharynx is clear.  Neck:     Thyroid: No thyromegaly.     Vascular: No JVD.     Trachea: No tracheal deviation.  Cardiovascular:     Rate and Rhythm: Normal rate. Rhythm irregular.     Heart sounds: No murmur heard. Pulmonary:     Effort: Pulmonary effort is normal. No respiratory distress.     Breath sounds: Normal breath sounds. No wheezing.  Abdominal:     General: Bowel sounds are normal. There is no distension.     Palpations: Abdomen is soft.     Tenderness: There is no abdominal tenderness.  Musculoskeletal:        General: No tenderness, deformity or signs of injury.     Cervical back: No rigidity.     Comments: Trace edema to BLE Trace edema to left hand.   Lymphadenopathy:      Cervical: No cervical adenopathy.  Skin:    General: Skin is warm and dry.     Findings: Bruising present.     Comments: Dry scaly erythematous skin to arms and legs.   Neurological:     General: No focal deficit present.     Mental Status: He is alert and oriented to person, place, and time. Mental status is at baseline.     Cranial Nerves: No cranial nerve deficit.  Psychiatric:        Mood and Affect: Mood normal.     Labs reviewed: Basic Metabolic Panel: Recent Labs    08/24/22 1447 08/26/22 0000 08/28/22 1345 08/30/22 0000 09/26/22 2224 10/31/22 0000  NA 125*   < > 128* 130* 130* 136*  K 4.3   < > 4.2  --  3.9 5.1  CL 92*   < > 97* 99 99 101  CO2 25   < > 25 21 24  26*  GLUCOSE 102*  --  98  --  120*  --   BUN 14   < > 10 8 15 12   CREATININE 0.73   < > 0.62 0.6 0.78 0.7  CALCIUM 9.1   < > 8.9 8.5* 8.4* 9.1  MG 1.7  --   --   --   --   --    < > = values in this interval not displayed.   Liver Function Tests: Recent Labs    08/24/22 1447 08/28/22 1345 09/26/22 2224  AST 27 20 22   ALT 26 16 15   ALKPHOS 116 116 113  BILITOT 0.5 0.6 0.7  PROT 7.3 6.9 7.3  ALBUMIN 4.3 4.0 4.0   No results for input(s): "LIPASE", "AMYLASE" in the last 8760 hours. No results for input(s): "AMMONIA" in the last 8760 hours. CBC: Recent Labs    08/24/22 1447 08/28/22 1345 09/26/22 2224  WBC 4.6 5.1 5.9  NEUTROABS 1.9 2.2 3.8  HGB 12.0* 11.0* 10.8*  HCT 32.3* 29.9* 31.6*  MCV 88.7 89.5 93.8  PLT 219 212 207   Lipid Panel: Recent Labs    06/24/22 0000  CHOL 214*  HDL 83*  TRIG 59   No results found for: "HGBA1C"  Procedures since last visit: Vagal Nerve Stimulation  Result Date: 03/06/2023 Windell Norfolk, MD     03/07/2023 10:04 AM    Assessment/Plan  1. Hand numbness In a distribution of C6-7 No neck pain or change in function No focal deficit Recommend stretching and let us know if this is not improving.   2. Hyponatremia Continue sodium tablets and  FR  3. Parkinsonian syndrome associated with symptomatic orthostatic hypotension (HCC) Noted Rise slowly   4. Seizure disorder (HCC) Followed by neurology   5. Benign prostatic hyperplasia with urinary frequency Continue flomax and Proscar  6. Chronic atrial fibrillation Continue flecainide for rate control Off anticoagulation per pharmacy   7. Grover's disease Using prn triamcinolone   Labs/tests ordered:  * No order type specified * CBC CMP Lipid prior to apt  Next appt:  F/U in 5 months with Peggye Ley NP   Total time :  time greater than 50% of total time spent doing pt counseling and coordination of care

## 2023-03-24 NOTE — Telephone Encounter (Signed)
Acknowledgment of lab faxed to [facility]

## 2023-04-08 DIAGNOSIS — Z23 Encounter for immunization: Secondary | ICD-10-CM | POA: Diagnosis not present

## 2023-04-10 ENCOUNTER — Non-Acute Institutional Stay: Payer: Medicare Other | Admitting: Adult Health

## 2023-04-10 ENCOUNTER — Encounter: Payer: Self-pay | Admitting: Adult Health

## 2023-04-10 DIAGNOSIS — L03115 Cellulitis of right lower limb: Secondary | ICD-10-CM

## 2023-04-10 MED ORDER — DOXYCYCLINE HYCLATE 100 MG PO TABS
100.0000 mg | ORAL_TABLET | Freq: Two times a day (BID) | ORAL | 0 refills | Status: AC
Start: 2023-04-10 — End: 2023-04-20

## 2023-04-10 NOTE — Progress Notes (Addendum)
Location:  Medical illustrator of Service:  ALF (13) Provider:   Peggye Ley, ANP [facility] [phone]   Mahlon Gammon, MD  Patient Care Team: Mahlon Gammon, MD as PCP - General (Internal Medicine) Thurmon Fair, MD as PCP - Cardiology (Cardiology) Jethro Bolus, MD as Consulting Physician (Ophthalmology) [name], MD (Inactive) as Consulting Physician (Neurology) Kermit Balo, DO (Geriatric Medicine)  Extended Emergency Contact Information Primary Emergency Contact: [name],[name] Address: [address]          [address] [place], [place] [address] Darden Amber of [place] Home Phone: [phone] Mobile Phone: [phone] Relation: Spouse Secondary Emergency Contact: Christiana Fuchs Home Phone: [phone] Mobile Phone: [phone] Relation: Son  Code Status:  DNR Goals of care: Advanced Directive information    03/24/2023    2:10 PM  Advanced Directives  Does Patient Have a Medical Advance Directive? Yes  Type of Advance Directive Out of facility DNR (pink MOST or yellow form);Living will  Does patient want to make changes to medical advance directive? No - Patient declined     Chief Complaint  Patient presents with   Acute Visit    Right lower ext redness    HPI:  Pt is a 87 y.o. male seen today for an acute visit for right lower extremity redness  Cameron Potts resides in assisted living at [facility].   The nurse saw me in the hallway and reported increased swelling and redness to Cameron Potts's RLE which was first noticed today. He is not having pain or drainage. Has had issues with skin rashes and cellulitis in the past. He does not feel ill. No fever no nausea or vomiting.   Past Medical History:  Diagnosis Date   Abnormality of gait 12/21/2013   Atrial fibrillation (HCC)    Blind left eye    Carcinoma in situ of prostate    Cardiac arrest (HCC)    Glaucoma    Hypercholesteremia    Hyperlipidemia     Metabolic encephalopathy    Mild left ventricular systolic dysfunction    Orthostatic hypotension    Presence of other specified functional implants    PSA elevation    Right frontal lobe lesion    Seizures (HCC)    Tinea pedis    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grover)    Urinary urgency    Past Surgical History:  Procedure Laterality Date   CARDIAC DEFIBRILLATOR PLACEMENT     cataract surgery     COLONOSCOPY     CRANIOTOMY Right    right anterior temporal resection   defibrillator replaced  March 2016   IMPLANTATION VAGAL NERVE STIMULATOR  June 2016   NASAL SINUS SURGERY     TONSILLECTOMY     age 49   VASECTOMY  42    Allergies  Allergen Reactions   Depakote [Divalproex Sodium] Other (See Comments)    Arthralgias     Outpatient Encounter Medications as of 04/10/2023  Medication Sig   doxycycline (VIBRA-TABS) 100 MG tablet Take 1 tablet (100 mg total) by mouth 2 (two) times daily for 10 days.   acetaminophen (TYLENOL) 325 MG tablet Take 650 mg by mouth every 6 (six) hours as needed for mild pain or moderate pain.   ALPRAZolam (XANAX) 0.25 MG tablet Take 1 tablet (0.25 mg total) by mouth 2 (two) times daily as needed for anxiety.   brimonidine-timolol (COMBIGAN) 0.2-0.5 % ophthalmic solution Place 1 drop into both eyes every 12 (  twelve) hours.    calcium carbonate (TUMS SMOOTHIES) 750 MG chewable tablet Chew 2 tablets by mouth 4 (four) times daily as needed for heartburn.   camphor-menthol (SARNA) lotion Apply 1 Application topically every 8 (eight) hours as needed for itching.   cetirizine (ZYRTEC) 10 MG tablet Take 10 mg by mouth as needed for allergies.   cholecalciferol (VITAMIN D) 400 units TABS tablet Take 400 Units by mouth 2 (two) times daily.   clobetasol cream (TEMOVATE) 0.05 % Apply 1 application  topically 2 (two) times daily as needed (rash).   DILANTIN 100 MG ER capsule Take 300 mg by mouth. In the evening   econazole nitrate 1 % cream Apply 1  Application topically 2 (two) times daily as needed.   finasteride (PROSCAR) 5 MG tablet Take 5 mg by mouth every morning.   flecainide (TAMBOCOR) 50 MG tablet Take 50 mg by mouth 2 (two) times daily.   hydrALAZINE (APRESOLINE) 10 MG tablet Take 1 tablet (10 mg total) by mouth 2 (two) times daily as needed.   lacosamide (VIMPAT) 200 MG TABS tablet Take 1 tablet (200 mg total) by mouth 2 (two) times daily.   latanoprost (XALATAN) 0.005 % ophthalmic solution Place 1 drop into both eyes at bedtime.   Levetiracetam 750 MG TB24 Take 2,250 mg by mouth daily.   LORazepam (ATIVAN) 2 MG/ML concentrated solution Take 0.5 mg by mouth as needed for anxiety.   miconazole (MICOTIN) 2 % powder Apply 1 Application topically 2 (two) times daily.   phenytoin (DILANTIN) 50 MG tablet Chew 75 mg by mouth daily.   polyethylene glycol (MIRALAX / GLYCOLAX) 17 g packet Take 17 g by mouth as needed.   polyethylene glycol (MIRALAX / GLYCOLAX) packet Take 17 g by mouth every other day.   sodium chloride 1 g tablet Take 2 g by mouth. Take 2 gm in the AM and 1 gm in the Pm   tamsulosin (FLOMAX) 0.4 MG CAPS capsule Take 0.4 mg by mouth daily.   triamcinolone cream (KENALOG) 0.1 % Apply 1 Application topically 2 (two) times daily as needed.   No facility-administered encounter medications on file as of 04/10/2023.    Review of Systems  Constitutional:  Negative for activity change, appetite change, chills, diaphoresis, fatigue, fever and unexpected weight change.  Respiratory:  Negative for cough, shortness of breath, wheezing and stridor.   Cardiovascular:  Positive for leg swelling. Negative for chest pain and palpitations.  Gastrointestinal:  Negative for abdominal distention, abdominal pain, constipation and diarrhea.  Genitourinary:  Negative for difficulty urinating and dysuria.  Musculoskeletal:  Positive for gait problem. Negative for arthralgias, back pain, joint swelling and myalgias.  Skin:  Positive for color  change and rash.  Neurological:  Negative for dizziness, seizures, syncope, facial asymmetry, speech difficulty, weakness and headaches.  Hematological:  Negative for adenopathy. Does not bruise/bleed easily.  Psychiatric/Behavioral:  Negative for agitation, behavioral problems and confusion.     Immunization History  Administered Date(s) Administered   Fluad Quad(high Dose 65+) 03/22/2022, 04/08/2023   Influenza, High Dose Seasonal PF 04/07/2020   Influenza, Seasonal, Injecte, Preservative Fre 02/22/2010   Influenza,inj,Quad PF,6+ Mos 04/07/2018, 04/15/2019   Influenza-Unspecified 03/17/2013, 02/24/2014, 02/16/2015, 03/21/2016, 03/17/2017, 04/07/2018, 03/21/2021   Moderna SARS-COV2 Booster Vaccination 01/11/2021, 03/28/2021, 04/08/2023   Moderna Sars-Covid-2 Vaccination 06/28/2019, 07/27/2019, 05/01/2020, 04/22/2022, 10/09/2022   Pneumococcal Conjugate-13 07/05/2013   Pneumococcal Polysaccharide-23 06/17/2006   Td 06/18/2007   Tdap 12/07/2015, 09/17/2020   Zoster Recombinant(Shingrix) 12/04/2016   Zoster,  Live 10/06/2008   Pertinent  Health Maintenance Due  Topic Date Due   INFLUENZA VACCINE  Completed      09/06/2022   10:18 AM 09/17/2022    3:12 PM 10/21/2022    1:58 PM 03/03/2023    9:30 AM 03/24/2023    2:10 PM  Fall Risk  Falls in the past year? 0 1 1 0 1  Was there an injury with Fall? 0 1 1 0 1  Fall Risk Category Calculator 0 3 3 0 2  Patient at Risk for Falls Due to History of fall(s);Impaired balance/gait;Impaired mobility History of fall(s);Impaired mobility;Impaired balance/gait Impaired balance/gait;Impaired mobility;History of fall(s) No Fall Risks History of fall(s);Impaired balance/gait;Impaired mobility  Fall risk Follow up Falls evaluation completed Falls evaluation completed Falls evaluation completed     Functional Status Survey:    Vitals:   04/10/23 1654 04/11/23 0824  BP: (!) 158/68 (!) 161/81  Pulse: 60   Resp: 18   Temp: 97.6 F (36.4 C)   SpO2:  95%    There is no height or weight on file to calculate BMI. Physical Exam Vitals and nursing note reviewed.  Constitutional:      Appearance: Normal appearance.  Musculoskeletal:     Comments: RLE +2 edema no TTP no drainage Mild warmth noted LLE no edema  Skin:    General: Skin is warm and dry.     Findings: Erythema (from toes to below the right knee) present.  Neurological:     General: No focal deficit present.     Mental Status: He is alert. Mental status is at baseline.  Psychiatric:        Mood and Affect: Mood normal.     Labs reviewed: Recent Labs    08/24/22 1447 08/26/22 0000 08/28/22 1345 08/30/22 0000 09/26/22 2224 10/31/22 0000 03/04/23 0000  NA 125*   < > 128*   < > 130* 136* 135*  K 4.3   < > 4.2  --  3.9 5.1 4.6  CL 92*   < > 97*   < > 99 101 98*  CO2 25   < > 25   < > 24 26* 23*  GLUCOSE 102*  --  98  --  120*  --   --   BUN 14   < > 10   < > 15 12 13   CREATININE 0.73   < > 0.62   < > 0.78 0.7 0.8  CALCIUM 9.1   < > 8.9   < > 8.4* 9.1 8.8  MG 1.7  --   --   --   --   --   --    < > = values in this interval not displayed.   Recent Labs    08/24/22 1447 08/28/22 1345 09/26/22 2224 03/04/23 0000  AST 27 20 22 25   ALT 26 16 15 18   ALKPHOS 116 116 113 148*  BILITOT 0.5 0.6 0.7  --   PROT 7.3 6.9 7.3  --   ALBUMIN 4.3 4.0 4.0 4.2   Recent Labs    08/24/22 1447 08/28/22 1345 09/26/22 2224 03/04/23 0000  WBC 4.6 5.1 5.9 6.1  NEUTROABS 1.9 2.2 3.8  --   HGB 12.0* 11.0* 10.8* 12.3*  HCT 32.3* 29.9* 31.6* 35*  MCV 88.7 89.5 93.8  --   PLT 219 212 207 226   Lab Results  Component Value Date   TSH 2.685 01/19/2022   No results found for: "HGBA1C" Lab  Results  Component Value Date   CHOL 214 (A) 06/24/2022   HDL 83 (A) 06/24/2022   LDLCALC 141 05/15/2021   TRIG 59 06/24/2022    Significant Diagnostic Results in last 30 days:  No results found.  Assessment/Plan 1. Cellulitis of right lower extremity  - doxycycline  (VIBRA-TABS) 100 MG tablet; Take 1 tablet (100 mg total) by mouth 2 (two) times daily for 10 days.  Dispense: 20 tablet; Refill: 0  Monitor vitals and for improvement of redness  Family/ staff Communication: resident and his wife.   Labs/tests ordered:  NA   Total time :  time greater than 50% of total time spent doing pt counseling and coordination of care

## 2023-04-23 DIAGNOSIS — G9341 Metabolic encephalopathy: Secondary | ICD-10-CM | POA: Diagnosis not present

## 2023-04-24 ENCOUNTER — Encounter: Payer: Self-pay | Admitting: Adult Health

## 2023-04-24 ENCOUNTER — Non-Acute Institutional Stay: Payer: Medicare Other | Admitting: Adult Health

## 2023-04-24 DIAGNOSIS — R200 Anesthesia of skin: Secondary | ICD-10-CM

## 2023-04-24 DIAGNOSIS — M65331 Trigger finger, right middle finger: Secondary | ICD-10-CM | POA: Diagnosis not present

## 2023-04-24 DIAGNOSIS — R269 Unspecified abnormalities of gait and mobility: Secondary | ICD-10-CM | POA: Diagnosis not present

## 2023-04-24 NOTE — Progress Notes (Signed)
Location:  Medical illustrator of Service:  ALF (13) Provider:   Peggye Ley, ANP [facility] [phone]   Cameron Gammon, MD  Patient Care Team: Cameron Gammon, MD as PCP - General (Internal Medicine) Thurmon Fair, MD as PCP - Cardiology (Cardiology) Jethro Bolus, MD as Consulting Physician (Ophthalmology) [name], MD (Inactive) as Consulting Physician (Neurology) Kermit Balo, DO (Geriatric Medicine)  Extended Emergency Contact Information Primary Emergency Contact: Cameron Potts Address: [address]          [address] [place], [place] [address] [place] of [place] Home Phone: [phone] Mobile Phone: [phone] Relation: Spouse Secondary Emergency Contact: Christiana Fuchs Home Phone: [phone] Mobile Phone: [phone] Relation: Son  Code Status:  DNR Goals of care: Advanced Directive information    03/24/2023    2:10 PM  Advanced Directives  Does Patient Have a Medical Advance Directive? Yes  Type of Advance Directive Out of facility DNR (pink MOST or yellow form);Living will  Does patient want to make changes to medical advance directive? No - Patient declined     Chief Complaint  Patient presents with   Acute Visit    weakness    HPI:  Pt is a 87 y.o. male seen today for an acute visit for weakness.  C[place]on Potts resides in [place]  PMH:  afib off coumadin due to falls, seizures, right temporal lobe resection, vagus nerve stimulator, SIADH, HTN, Grover's disease, Cardiac arrest with defibrillator, HLD, and BPH  The nurse reports he has been a little more unsteady and needed help with ADLs for 3 days. NO one sided weakness, slurred speech, or seizure. Intermittently he is using a WC for a long distance. This has happened before and resolves He reports a contracture in the right hand that is painful of the middle finger intermittent. It locks then relaxes. He has seen hand surgery in the past but he says  treatment quit working.  He also reported left arm and hand numbness going from the upper arm to the thumb and index finger. His is improving and has been going on off and on for months. He denies any neck pain. Denies any dizziness.  No vital sign abnormality.  Labs were checked for the above complaint. Sodium was normal.  Dilantin level 22.9 (goal 18-22).    CT of the head 08/24/22 IMPRESSION: 1. No acute intracranial abnormality. 2. Unchanged postoperative appearance from prior right anterior temporal lobectomy. Unchanged encephalomalacia along the right anterior cingulate gyrus.  Also noted CT of the chest showed mild to moderate spondylosis of the cervical and thoracic spine.  Past Medical History:  Diagnosis Date   Abnormality of gait 12/21/2013   Atrial fibrillation (HCC)    Blind left eye    Carcinoma in situ of prostate    Cardiac arrest (HCC)    Glaucoma    Hypercholesteremia    Hyperlipidemia    Metabolic encephalopathy    Mild left ventricular systolic dysfunction    Orthostatic hypotension    Presence of other specified functional implants    PSA elevation    Right frontal lobe lesion    Seizures (HCC)    Tinea pedis    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grover)    Urinary urgency    Past Surgical History:  Procedure Laterality Date   CARDIAC DEFIBRILLATOR PLACEMENT     cataract surgery     COLONOSCOPY     CRANIOTOMY Right    right anterior temporal  resection   defibrillator replaced  March 2016   IMPLANTATION VAGAL NERVE STIMULATOR  June 2016   NASAL SINUS SURGERY     TONSILLECTOMY     age 65   VASECTOMY  76    Allergies  Allergen Reactions   Depakote [Divalproex Sodium] Other (See Comments)    Arthralgias     Outpatient Encounter Medications as of 04/24/2023  Medication Sig   acetaminophen (TYLENOL) 325 MG tablet Take 650 mg by mouth every 6 (six) hours as needed for mild pain or moderate pain.   ALPRAZolam (XANAX) 0.25 MG tablet Take 1 tablet  (0.25 mg total) by mouth 2 (two) times daily as needed for anxiety.   brimonidine-timolol (COMBIGAN) 0.2-0.5 % ophthalmic solution Place 1 drop into both eyes every 12 (twelve) hours.    calcium carbonate (TUMS SMOOTHIES) 750 MG chewable tablet Chew 2 tablets by mouth 4 (four) times daily as needed for heartburn.   camphor-menthol (SARNA) lotion Apply 1 Application topically every 8 (eight) hours as needed for itching.   cetirizine (ZYRTEC) 10 MG tablet Take 10 mg by mouth as needed for allergies.   cholecalciferol (VITAMIN D) 400 units TABS tablet Take 400 Units by mouth 2 (two) times daily.   clobetasol cream (TEMOVATE) 0.05 % Apply 1 application  topically 2 (two) times daily as needed (rash).   DILANTIN 100 MG ER capsule Take 300 mg by mouth. In the evening   econazole nitrate 1 % cream Apply 1 Application topically 2 (two) times daily as needed.   finasteride (PROSCAR) 5 MG tablet Take 5 mg by mouth every morning.   flecainide (TAMBOCOR) 50 MG tablet Take 50 mg by mouth 2 (two) times daily.   hydrALAZINE (APRESOLINE) 10 MG tablet Take 1 tablet (10 mg total) by mouth 2 (two) times daily as needed.   lacosamide (VIMPAT) 200 MG TABS tablet Take 1 tablet (200 mg total) by mouth 2 (two) times daily.   latanoprost (XALATAN) 0.005 % ophthalmic solution Place 1 drop into both eyes at bedtime.   Levetiracetam 750 MG TB24 Take 2,250 mg by mouth daily.   LORazepam (ATIVAN) 2 MG/ML concentrated solution Take 0.5 mg by mouth as needed for anxiety.   miconazole (MICOTIN) 2 % powder Apply 1 Application topically 2 (two) times daily.   phenytoin (DILANTIN) 50 MG tablet Chew 75 mg by mouth daily.   polyethylene glycol (MIRALAX / GLYCOLAX) 17 g packet Take 17 g by mouth as needed.   polyethylene glycol (MIRALAX / GLYCOLAX) packet Take 17 g by mouth every other day.   sodium chloride 1 g tablet Take 2 g by mouth. Take 2 gm in the AM and 1 gm in the Pm   tamsulosin (FLOMAX) 0.4 MG CAPS capsule Take 0.4 mg by  mouth daily.   triamcinolone cream (KENALOG) 0.1 % Apply 1 Application topically 2 (two) times daily as needed.   No facility-administered encounter medications on file as of 04/24/2023.    Review of Systems  Constitutional:  Positive for activity change. Negative for appetite change, chills, diaphoresis, fatigue, fever and unexpected weight change.  HENT:  Negative for congestion.   Eyes:  Negative for visual disturbance.  Respiratory:  Negative for cough, shortness of breath, wheezing and stridor.   Cardiovascular:  Positive for leg swelling. Negative for chest pain and palpitations.  Gastrointestinal:  Negative for abdominal distention, abdominal pain, constipation and diarrhea.  Genitourinary:  Negative for difficulty urinating and dysuria.  Musculoskeletal:  Positive for gait problem. Negative  for arthralgias, back pain, joint swelling and myalgias.       Contracture right  Neurological:  Positive for weakness and numbness. Negative for dizziness, seizures, syncope, facial asymmetry, speech difficulty, light-headedness and headaches.  Hematological:  Negative for adenopathy. Does not bruise/bleed easily.  Psychiatric/Behavioral:  Negative for agitation, behavioral problems and confusion.     Immunization History  Administered Date(s) Administered   Fluad Quad(high Dose 65+) 03/22/2022, 04/08/2023   Influenza, High Dose Seasonal PF 04/07/2020   Influenza, Seasonal, Injecte, Preservative Fre 02/22/2010   Influenza,inj,Quad PF,6+ Mos 04/07/2018, 04/15/2019   Influenza-Unspecified 03/17/2013, 02/24/2014, 02/16/2015, 03/21/2016, 03/17/2017, 04/07/2018, 03/21/2021   Moderna SARS-COV2 Booster Vaccination 01/11/2021, 03/28/2021, 04/08/2023   Moderna Sars-Covid-2 Vaccination 06/28/2019, 07/27/2019, 05/01/2020, 04/22/2022, 10/09/2022   Pneumococcal Conjugate-13 07/05/2013   Pneumococcal Polysaccharide-23 06/17/2006   Td 06/18/2007   Tdap 12/07/2015, 09/17/2020   Zoster  Recombinant(Shingrix) 12/04/2016   Zoster, Live 10/06/2008   Pertinent  Health Maintenance Due  Topic Date Due   INFLUENZA VACCINE  Completed      09/06/2022   10:18 AM 09/17/2022    3:12 PM 10/21/2022    1:58 PM 03/03/2023    9:30 AM 03/24/2023    2:10 PM  Fall Risk  Falls in the past year? 0 1 1 0 1  Was there an injury with Fall? 0 1 1 0 1  Fall Risk Category Calculator 0 3 3 0 2  Patient at Risk for Falls Due to History of fall(s);Impaired balance/gait;Impaired mobility History of fall(s);Impaired mobility;Impaired balance/gait Impaired balance/gait;Impaired mobility;History of fall(s) No Fall Risks History of fall(s);Impaired balance/gait;Impaired mobility  Fall risk Follow up Falls evaluation completed Falls evaluation completed Falls evaluation completed     Functional Status Survey:    Vitals:   04/24/23 1157  BP: (!) 140/72  Pulse: 62  Resp: 20  Temp: (!) 97 F (36.1 C)  SpO2: 100%   There is no height or weight on file to calculate BMI. Physical Exam Vitals and nursing note reviewed.  Constitutional:      General: He is not in acute distress.    Appearance: He is not diaphoretic.  HENT:     Head: Normocephalic and atraumatic.     Mouth/Throat:     Mouth: Mucous membranes are moist.     Pharynx: Oropharynx is clear.  Eyes:     Extraocular Movements: Extraocular movements intact.     Conjunctiva/sclera: Conjunctivae normal.     Pupils: Pupils are equal, round, and reactive to light.  Neck:     Thyroid: No thyromegaly.     Vascular: No JVD.     Trachea: No tracheal deviation.  Cardiovascular:     Rate and Rhythm: Normal rate. Rhythm irregular.     Heart sounds: No murmur heard. Pulmonary:     Effort: Pulmonary effort is normal. No respiratory distress.     Breath sounds: Normal breath sounds. No wheezing.  Abdominal:     General: Bowel sounds are normal. There is no distension.     Palpations: Abdomen is soft.     Tenderness: There is no abdominal  tenderness.  Musculoskeletal:     Right hand: Deformity (contracture of middle finger and 5th digit which can be relaxed) present. No bony tenderness. Normal range of motion. Normal capillary refill. Normal pulse.     Left hand: Normal. No swelling, deformity, lacerations or tenderness. Normal range of motion. Normal strength. Normal capillary refill. Normal pulse.     Cervical back: No rigidity or tenderness.  Comments: BLE edema +1 R>L  Lymphadenopathy:     Cervical: No cervical adenopathy.  Skin:    General: Skin is warm and dry.     Findings: Erythema (mild and improved to BLE) present.  Neurological:     General: No focal deficit present.     Mental Status: He is alert. Mental status is at baseline.     Cranial Nerves: No cranial nerve deficit.     Motor: No weakness.     Coordination: Coordination normal.     Gait: Gait abnormal.     Comments: Strength 4/5 BUE and BLE      Labs reviewed: Recent Labs    08/24/22 1447 08/26/22 0000 08/28/22 1345 08/30/22 0000 09/26/22 2224 10/31/22 0000 03/04/23 0000  NA 125*   < > 128*   < > 130* 136* 135*  K 4.3   < > 4.2  --  3.9 5.1 4.6  CL 92*   < > 97*   < > 99 101 98*  CO2 25   < > 25   < > 24 26* 23*  GLUCOSE 102*  --  98  --  120*  --   --   BUN 14   < > 10   < > 15 12 13   CREATININE 0.73   < > 0.62   < > 0.78 0.7 0.8  CALCIUM 9.1   < > 8.9   < > 8.4* 9.1 8.8  MG 1.7  --   --   --   --   --   --    < > = values in this interval not displayed.   Recent Labs    08/24/22 1447 08/28/22 1345 09/26/22 2224 03/04/23 0000  AST 27 20 22 25   ALT 26 16 15 18   ALKPHOS 116 116 113 148*  BILITOT 0.5 0.6 0.7  --   PROT 7.3 6.9 7.3  --   ALBUMIN 4.3 4.0 4.0 4.2   Recent Labs    08/24/22 1447 08/28/22 1345 09/26/22 2224 03/04/23 0000  WBC 4.6 5.1 5.9 6.1  NEUTROABS 1.9 2.2 3.8  --   HGB 12.0* 11.0* 10.8* 12.3*  HCT 32.3* 29.9* 31.6* 35*  MCV 88.7 89.5 93.8  --   PLT 219 212 207 226   Lab Results  Component Value  Date   TSH 2.685 01/19/2022   No results found for: "HGBA1C" Lab Results  Component Value Date   CHOL 214 (A) 06/24/2022   HDL 83 (A) 06/24/2022   LDLCALC 141 05/15/2021   TRIG 59 06/24/2022    Significant Diagnostic Results in last 30 days:  No results found.  Assessment/Plan  1. Abnormality of gait Long standing issue with falls and gait due to prior hx of long term seizure disorder.  Slight worsening with normal vitals, normal labs Continue to monitor at this time.  If there are further issues I would reach out to neurology.  Declined PT  2. Hand numbness Intermittent over a period of months to the left arm and and hand in a C6-7 distribution.  He does not want further eval at this time due to multiple health issues.    3. Trigger middle finger of right hand Declined hand surgery apt   Family/ staff Communication: discussed with resident and nursing supervisor.   Labs/tests ordered:  CBC BMP Dilantin level complete

## 2023-04-27 ENCOUNTER — Encounter: Payer: Self-pay | Admitting: Neurology

## 2023-04-27 NOTE — Progress Notes (Signed)
Patient had recent labs completed on November 6, his sodium is normal 137, his phenytoin level is slightly elevated at 22.9 he was also anemic, hemoglobin at 12 hematocrit 33.6.  They are report of increasing weakness, difficulty standing in the morning as well as fatigue and confusion.  No seizures reported.  Ask [facility] to repeat Dilantin level with Keppra and Vimpat next week.   Dr. Teresa Coombs

## 2023-04-28 ENCOUNTER — Telehealth: Payer: Self-pay

## 2023-04-28 NOTE — Telephone Encounter (Signed)
Dilantin level order faxed to [facility]

## 2023-05-06 DIAGNOSIS — Z7901 Long term (current) use of anticoagulants: Secondary | ICD-10-CM | POA: Diagnosis not present

## 2023-05-12 NOTE — Telephone Encounter (Signed)
No additional recommendations, please continue current medications at the same dose.   Dr. Teresa Coombs

## 2023-05-12 NOTE — Telephone Encounter (Addendum)
Phenytoin & Keppra level received via fax from [facility].  05/06/23 Phenytoin level -24.5. Keppra -37.6 Copy scanned in chart, original on MD desk.

## 2023-05-13 ENCOUNTER — Telehealth: Payer: Self-pay

## 2023-05-13 NOTE — Telephone Encounter (Signed)
Faxed orders to [facility]

## 2023-05-22 ENCOUNTER — Encounter: Payer: PRIVATE HEALTH INSURANCE | Admitting: Cardiovascular Disease

## 2023-05-27 ENCOUNTER — Ambulatory Visit (INDEPENDENT_AMBULATORY_CARE_PROVIDER_SITE_OTHER): Payer: Medicare Other

## 2023-05-27 DIAGNOSIS — I447 Left bundle-branch block, unspecified: Secondary | ICD-10-CM

## 2023-05-28 LAB — CUP PACEART REMOTE DEVICE CHECK
Battery Remaining Longevity: 11 mo
Battery Voltage: 2.84 V
Brady Statistic AP VP Percent: 0.21 %
Brady Statistic AP VS Percent: 99.78 %
Brady Statistic AS VP Percent: 0 %
Brady Statistic AS VS Percent: 0.01 %
Brady Statistic RA Percent Paced: 99.98 %
Brady Statistic RV Percent Paced: 0.2 %
Date Time Interrogation Session: 20241210033324
HighPow Impedance: 72 Ohm
Lead Channel Impedance Value: 361 Ohm
Lead Channel Impedance Value: 418 Ohm
Lead Channel Impedance Value: 513 Ohm
Lead Channel Pacing Threshold Amplitude: 0.75 V
Lead Channel Pacing Threshold Amplitude: 1.625 V
Lead Channel Pacing Threshold Pulse Width: 0.4 ms
Lead Channel Pacing Threshold Pulse Width: 0.4 ms
Lead Channel Sensing Intrinsic Amplitude: 1.875 mV
Lead Channel Sensing Intrinsic Amplitude: 1.875 mV
Lead Channel Sensing Intrinsic Amplitude: 6.25 mV
Lead Channel Sensing Intrinsic Amplitude: 6.25 mV
Lead Channel Setting Pacing Amplitude: 2 V
Lead Channel Setting Pacing Amplitude: 3.25 V
Lead Channel Setting Pacing Pulse Width: 0.4 ms
Lead Channel Setting Sensing Sensitivity: 0.3 mV
Zone Setting Status: 755011

## 2023-05-29 ENCOUNTER — Encounter: Payer: Self-pay | Admitting: Cardiovascular Disease

## 2023-05-29 ENCOUNTER — Other Ambulatory Visit: Payer: Self-pay | Admitting: Adult Health

## 2023-05-29 ENCOUNTER — Ambulatory Visit: Payer: Medicare Other | Attending: Cardiovascular Disease | Admitting: Cardiovascular Disease

## 2023-05-29 ENCOUNTER — Encounter: Payer: PRIVATE HEALTH INSURANCE | Admitting: Cardiovascular Disease

## 2023-05-29 VITALS — BP 150/60 | HR 60 | Ht 68.0 in | Wt 170.4 lb

## 2023-05-29 DIAGNOSIS — I951 Orthostatic hypotension: Secondary | ICD-10-CM | POA: Diagnosis not present

## 2023-05-29 DIAGNOSIS — Z8679 Personal history of other diseases of the circulatory system: Secondary | ICD-10-CM | POA: Diagnosis not present

## 2023-05-29 DIAGNOSIS — G20C Parkinsonism, unspecified: Secondary | ICD-10-CM | POA: Diagnosis not present

## 2023-05-29 DIAGNOSIS — E871 Hypo-osmolality and hyponatremia: Secondary | ICD-10-CM | POA: Insufficient documentation

## 2023-05-29 DIAGNOSIS — I2510 Atherosclerotic heart disease of native coronary artery without angina pectoris: Secondary | ICD-10-CM | POA: Insufficient documentation

## 2023-05-29 DIAGNOSIS — E7800 Pure hypercholesterolemia, unspecified: Secondary | ICD-10-CM | POA: Insufficient documentation

## 2023-05-29 DIAGNOSIS — Z79899 Other long term (current) drug therapy: Secondary | ICD-10-CM | POA: Insufficient documentation

## 2023-05-29 DIAGNOSIS — Z5181 Encounter for therapeutic drug level monitoring: Secondary | ICD-10-CM | POA: Insufficient documentation

## 2023-05-29 DIAGNOSIS — I447 Left bundle-branch block, unspecified: Secondary | ICD-10-CM | POA: Diagnosis not present

## 2023-05-29 DIAGNOSIS — I495 Sick sinus syndrome: Secondary | ICD-10-CM | POA: Diagnosis not present

## 2023-05-29 DIAGNOSIS — Z95810 Presence of automatic (implantable) cardiac defibrillator: Secondary | ICD-10-CM | POA: Insufficient documentation

## 2023-05-29 DIAGNOSIS — G40909 Epilepsy, unspecified, not intractable, without status epilepticus: Secondary | ICD-10-CM | POA: Diagnosis not present

## 2023-05-29 DIAGNOSIS — G40919 Epilepsy, unspecified, intractable, without status epilepticus: Secondary | ICD-10-CM

## 2023-05-29 DIAGNOSIS — I482 Chronic atrial fibrillation, unspecified: Secondary | ICD-10-CM | POA: Diagnosis not present

## 2023-05-29 MED ORDER — LACOSAMIDE 200 MG PO TABS
200.0000 mg | ORAL_TABLET | Freq: Two times a day (BID) | ORAL | 3 refills | Status: DC
Start: 2023-05-29 — End: 2023-09-25

## 2023-05-29 NOTE — Telephone Encounter (Signed)
Patient is requesting a refill of the following medications: Requested Prescriptions   Pending Prescriptions Disp Refills   lacosamide (VIMPAT) 200 MG TABS tablet [Pharmacy Med Name: Lacosamide 200 MG Tablet] 60 tablet 5    Sig: TAKE 1 TABLET BY MOUTH TWICE A DAY (CONTROL)    Date of last refill:01/27/2023  Refill amount: 60 tablets 3 refill

## 2023-05-29 NOTE — Progress Notes (Signed)
Cardiology Office Note:    Date:  05/29/2023   ID:  Cameron Potts, DOB 25-Mar-1936, MRN [id number]  PCP:  Mahlon Gammon, MD   [facility] Providers Cardiologist:  Thurmon Fair, MD     Referring MD: Mahlon Gammon, MD   Chief Complaint  Patient presents with   ICD check    History of Present Illness:    Cameron Potts is a 87 y.o. male with a hx of paroxysmal atrial flutter/fibrillation and an episode of cardiac arrest (per limited documents available this apparently occurred not long after a right hippocampal mass resection complicated by seizures and cardiac arrest in 2008), which led to ICD implantation.  In 2015 he had an inappropriate defibrillator discharge for atrial flutter with rapid ventricular response.  He underwent ICD generator change out in 2016.  He has recurrent problems with hyponatremia.  Cameron Potts lived in [place] for many years after which he retired to [place] and received care at the [facility] in [place].  For the last 5 years he has been living in [place], his wife's native town.  He is a resident at [facility].  He has continued receiving some of his medical care in the [facility] in [place].  Despite numerous hospitalizations for epilepsy and hyponatremia since 2019, there have been no active cardiac issues.   Functional status remains very variable sometimes he feels so weak that he cannot get out of the chair, but at other times he is able to walk with a walker.  Limitations are not related to shortness of breath or chest pain.  He does have orthostatic dizziness.  He has not had any recent seizures.  From a cardiac point of view Cameron Potts has done very well over the last year.  He has not had any problems with congestive heart failure, chest discomfort, rhythm problems or defibrillator discharges.  He had a brief ED evaluation for hyponatremia in March and had a COVID-19 related ED visit on April 11th but otherwise  has not had any serious health problems this year.  His OptiVol (thoracic impedance monitor) did show a dip in March/April, but he did not have clinical heart failure and the thoracic impedance has been normal since then.  His biggest complaint today is problems with numbness and paresthesias in his left fourth and fifth fingers, but no real motor dysfunction.  He remains quite sedentary and is prone to balance problems and falls.  He has not had any serious injuries.  He does have symptoms consistent with orthostatic hypotension.  He did have an ER visit for hyponatremia in March when his sodium was as low as 125, but most recently his sodium is almost normal at 135.  He typically has mild hyponatremia with a sodium level in the 130 range.  Interrogation of his defibrillator shows normal device function.  Estimated generator longevity is 10 months.  Lead parameters are excellent.  He has virtually 100% atrial paced, ventricular sensed rhythm.  There is no underlying atrial activity when pacing is taken down to 30 bpm, but he has normal AV conduction with atrial pacing.  He does have a junctional escape rhythm at about 28 bpm.  He has not had any episodes of high ventricular rate or defibrillator intervention.  He has not had any atrial fibrillation.  His sedentary status is a perfect about activity level of 0.7 hours/day.  His thoracic impedance/OptiVol was out of range in March-April, but has been  normal since.  His previous appointment we stopped treatment with warfarin since we have not seen any significant arrhythmia since 2015.  He continues to be prone to falls, but he has not had any serious bleeding events.  He has not had any focal neurological events consistent with stroke or TIA.  He is on chronic treatment with flecainide 50 mg twice daily but is not receiving a beta-blocker (presumably due to orthostatic hypotension). His device is a Medtronic Evera XT device (model number DD BB 1 D1),  implanted as a generator change out in 2016.  The ventricular lead is a Retail buyer lead implanted in 2008 (model not clear).  The atrial lead is a Medtronic 5076-52 cm lead also implanted in 2008.  Cannot comment on data from his original generator which she had from 2008 until 2016, but the current device has never delivered therapy for ventricular tachycardia or ventricular fibrillation since its implantation in 2016.  He has a history of drug-resistant focal epilepsy for which he underwent a right anterior temporal lobectomy in 2008.  He underwent implantation of a vagal nerve stimulator in the right subclavian area for this in 2016.  This led to some improvement, but he has had multiple breakthrough seizures since, including intractable seizures requiring numerous episodes of hospitalization.  He also carries a diagnosis of parkinsonism and associated orthostatic hypotension. ead CT shows sequelae of previous right temporal lobectomy as well as a "unchanged small chronic infarct involving the right cingulate gyrus" comparison to 2020.  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    camphor-menthol (SARNA) lotion Apply 1 Application topically every 8 (eight) hours as needed for itching.   cetirizine (ZYRTEC) 10 MG tablet Take 10 mg by mouth as needed for allergies.   cholecalciferol (VITAMIN D) 400 units TABS tablet Take 400 Units by mouth 2 (two) times daily.   clobetasol cream (TEMOVATE) 0.05 % Apply 1 application  topically 2 (two) times daily as needed (rash).   DILANTIN 100 MG ER capsule Take 300 mg by mouth. In the evening   econazole nitrate 1 % cream Apply 1 Application topically 2 (two) times daily as needed.   finasteride (PROSCAR) 5 MG tablet Take 5 mg by mouth every morning.   flecainide (TAMBOCOR) 50 MG tablet  Take 50 mg by mouth 2 (two) times daily.   latanoprost (XALATAN) 0.005 % ophthalmic solution Place 1 drop into both eyes at bedtime.   Levetiracetam 750 MG TB24 Take 2,250 mg by mouth daily.   LORazepam (ATIVAN) 2 MG/ML concentrated solution Take 0.5 mg by mouth as needed for anxiety.   miconazole (MICOTIN) 2 % powder Apply 1 Application topically 2 (two) times daily.   phenytoin (DILANTIN) 50 MG tablet Chew 75 mg by mouth daily.   polyethylene glycol (MIRALAX / GLYCOLAX) packet Take 17 g by mouth every other day.   sodium chloride 1 g tablet Take 2 g by mouth. Take 2 gm in the AM and 1 gm in the Pm   tamsulosin (FLOMAX) 0.4 MG CAPS capsule Take 0.4 mg by mouth daily.   triamcinolone cream (KENALOG) 0.1 % Apply 1 Application topically 2 (two) times daily as needed.   [DISCONTINUED] lacosamide (VIMPAT) 200 MG TABS tablet Take 1 tablet (200 mg total) by mouth 2 (two) times daily.     Allergies:   Depakote [divalproex sodium]   Social History   Socioeconomic History   Marital status: Married    Spouse name: Erskine Squibb   Number of children: 3   Years of education: MBA    Highest education level: Not on file  Occupational History   Occupation: Retired   Occupation: Field seismologist  Tobacco Use   Smoking status: Never   Smokeless tobacco: Never   Tobacco comments:    quit 1960s  Vaping Use   Vaping status: Never Used  Substance and Sexual Activity   Alcohol use: Not Currently    Comment: former beer drinker   Drug use: No   Sexual activity: Not Currently  Other Topics Concern   Not on file  Social History Narrative   Lives Assisted Living, [facility]   Right Handed   Drinks 2-3 cups caffeine daily   Social Drivers of Health   Financial Resource Strain: Low Risk  (04/15/2018)   Overall Financial Resource Strain (CARDIA)    Difficulty of Paying Living Expenses: Not hard at all  Food Insecurity: No Food Insecurity (04/15/2018)   Hunger Vital Sign    Worried About Running Out of Food in the Last Year: Never true    Ran Out of Food in the Last Year: Never true  Transportation Needs: No Transportation Needs (04/15/2018)   PRAPARE - Administrator, Civil Service (Medical): No    Lack of Transportation (Non-Medical): No  Physical Activity: Sufficiently Active (04/15/2018)   Exercise Vital Sign    Days of Exercise per Week: 5 days    Minutes of Exercise per Session: 30 min  Stress: No Stress Concern Present (04/15/2018)   [employer] of Occupational Health - Occupational Stress Questionnaire    Feeling of Stress : Not at all  Social Connections: Somewhat Isolated (04/15/2018)   Social Connection and Isolation Panel [NHANES]    Frequency of Communication with Friends and Family: Three times a week    Frequency of Social Gatherings with Friends and Family: Three times a week    Attends Religious Services: Never    Active Member of Clubs or Organizations: No    Attends Banker Meetings: Never    Marital Status: Married     Family History: The patient's family history includes Cancer - Lung in his mother;  Cancer - Prostate in his father.  ROS:   Please see the history of present illness.  All other systems reviewed and are negative.  EKGs/Labs/Other Studies Reviewed:    The following studies were reviewed today: Review of multiple imaging studies from recent hospitalizations in [place] Review of hospitalization records and notes from [facility], [place]  EKG:  EKG is ordered today.  The ekg ordered today demonstrates atrial paced, ventricular sensed rhythm with very long AV delay approximately 440 ms, left bundle branch block, QRS 134 ms, QTc 487 ms  Recent Labs: 08/24/2022: Magnesium 1.7 03/04/2023: ALT 18; BUN 13; Creatinine 0.8; Hemoglobin 12.3; Platelets 226; Potassium 4.6; Sodium 135  Recent Lipid Panel    Component Value Date/Time   CHOL 214 (A) 06/24/2022 0000   TRIG 59 06/24/2022 0000   HDL 83 (A) 06/24/2022 0000   LDLCALC 141 05/15/2021 0000     Risk Assessment/Calculations:    CHA2DS2-VASc Score =     This indicates a  % annual risk of stroke. The patient's score is based upon:       HYPERTENSION CONTROL Vitals:   05/29/23 1014 05/29/23 2026  BP: (!) 152/64 (!) 150/60    The patient's blood pressure is elevated above target today.  In order to address the patient's elevated BP:        Physical Exam:    VS:  BP (!) 150/60   Pulse 60   Ht 5\' 8"  (1.727 m)   Wt 170 lb 6.4 oz (77.3 kg)   SpO2 94%   BMI 25.91 kg/m     Wt Readings from Last 3 Encounters:  05/29/23 170 lb 6.4 oz (77.3 kg)  03/24/23 171 lb 3.2 oz (77.7 kg)  03/06/23 169 lb (76.7 kg)     General: Alert, oriented x3, no distress, healthy defibrillator site. Head: no evidence of trauma, PERRL, EOMI, no exophtalmos or lid lag, no myxedema, no xanthelasma; normal ears, nose and oropharynx Neck: normal jugular venous pulsations and no hepatojugular reflux; brisk carotid pulses without delay and no carotid bruits Chest: clear to auscultation, no signs of consolidation by  percussion or palpation, normal fremitus, symmetrical and full respiratory excursions Cardiovascular: normal position and quality of the apical impulse, regular rhythm, normal first and second heart sounds, no murmurs, rubs or gallops Abdomen: no tenderness or distention, no masses by palpation, no abnormal pulsatility or arterial bruits, normal bowel sounds, no hepatosplenomegaly Extremities: no clubbing, cyanosis or edema; 2+ radial, ulnar and brachial pulses bilaterally; 2+ right femoral, posterior tibial and dorsalis pedis pulses; 2+ left femoral, posterior tibial and dorsalis pedis pulses; no subclavian or femoral bruits Neurological: grossly nonfocal, except that he keeps his head hanging down throughout the entire interview. Psych: Normal mood and affect   ASSESSMENT:    1. History of atrial flutter   2. SSS (sick sinus syndrome) (HCC)   3. LBBB (left bundle branch block)   4. Encounter for monitoring flecainide therapy   5. Coronary artery calcification seen on CAT scan   6. Hypercholesterolemia   7. ICD (implantable cardioverter-defibrillator) in place   8. Hyponatremia   9. Seizure disorder (HCC)   10. Parkinsonian syndrome associated with symptomatic orthostatic hypotension (HCC)    PLAN:    In order of problems listed above:  History of atrial flutter: None has been detected since 2015 when he had an appropriate defibrillator discharge.  On the look at night, but not on any AV nodal blocking agents due to orthostatic hypotension.  Not on anticoagulants due to very low prevalence of arrhythmia and high risk of injury/bleeding. Acquired thrombophilia:  Anticoagulants are not justified.  His arrhythmia burden is very low.  He cannot take direct oral anticoagulants due to his seizure medicines.  The risk of warfarin related bleeding exceeds the benefit.  Continue to monitor for arrhythmia recurrence with his pacemaker.  SSS: He does not have any detectable atrial electrical  activity, likely due to his advanced age, may be also due to flecainide therapy.  He is "atrially pacemaker dependent".  He does have a junctional escape rhythm but this is quite slow, less than 30 bpm.  He has normal 1: 1 AV conduction, LVH with rather long AV conduction times (AV delay over 400 ms). LBBB: Today this is incomplete on his ECG, with a QRS duration of 160 ms.  No evidence of structural heart disease.  Essentially had a normal echo in October 2023. Flecainide: Prescribed after he developed atrial flutter with 1: 1 AV conduction, but unable to use AV nodal blocking agents due to severe orthostatic hypotension.  No known clinical history of CAD or PAD, but these are definitely possible at his age.  No evidence of significant structural heart disease by echo, at this point continuing the flecainide appears to be a reasonable decision.   Coronary artery calcifications: " Severe atheromatous calcifications in the coronary arteries" described on CT. he remains free of angina pectoris, intermittent claudication or any other symptoms of severe atherosclerotic disease.  Not currently on lipid-lowering therapy.  Considering his age and comorbid conditions, uncertain what the lipid-lowering therapy would make much of a difference at this point. HLP: Most recent LDL cholesterol was 141 in 2022.  Again he has not had clinical events associated with vascular disease, but has some imaging evidence of atherosclerosis. ICD: Normal device function.  He has never received appropriate VT/VF intervention (did have ICD shock for atrial flutter with high ventricular rate in 2015, on his old generator).  No device reprogramming performed today.  Thankfully he does not require ventricular pacing yet.  He does appear to be pacemaker dependent (no atrial activity, very slow junctional escape rhythm), but I would advise downgrading his device to a pacemaker next year.  He has never received appropriate VT therapies, he has  normal left ventricular function, he is now 87 years old.   Hyponatremia: Continues to give him problems intermittently, but currently not a big issue May be related to SIADH or chronic treatment with levetiracetam, but his seizures have been very difficult to manage, so I do not think this medication can be stopped.  Does not appear to have adrenal insufficiency (baseline cortisol level 20.5, albeit with a paradoxical drop in response to ACTH on labs performed 01/26/2022).  He is tolerating salt loading without clinical heart failure.  Stable OptiVol for the most part during the last 12 months. Seizure disorder:  He is on 3 different medications and has a vagal nerve stimulator.  He has had breakthrough seizures despite this.  Has history of right temporal lobectomy although I am uncertain whether this was performed to control his seizures or was the cause for his seizures. Orthostatic hypotension: Systolic blood pressure today is 152 which is probably the best range for him.  Has had numerous falls, and a volatile blood pressure is probably related to his parkinsonism syndrome, may be also associated with salt wasting/hyponatremia.  Avoid "perfect" control of his blood pressure.  Tolerate systolic blood pressure of 160.  Avoid sudden changes in position.  Consider compression stockings and/or abdominal binder if the situation worsens.  Medication Adjustments/Labs and Tests Ordered: Current medicines are reviewed at length with the patient today.  Concerns regarding medicines are outlined above.  Orders Placed This Encounter  Procedures   EKG 12-Lead   No orders of the defined types were placed in this encounter.   Patient Instructions  Medication Instructions:  No changes *If you need a refill on your cardiac medications before your next appointment, please call your pharmacy*  Follow-Up: At [facility], you and your health needs are our priority.  As part of our  continuing mission to provide you with exceptional heart care, we have created designated Provider Care Teams.  These Care Teams include your primary Cardiologist (physician) and Advanced Practice Providers (APPs -  Physician Assistants and Nurse Practitioners) who all work together to provide you with the care you need, when you need it.  We recommend signing up for the patient portal called "MyChart".  Sign up information is provided on this After Visit Summary.  MyChart is used to connect with patients for Virtual Visits (Telemedicine).  Patients are able to view lab/test results, encounter notes, upcoming appointments, etc.  Non-urgent messages can be sent to your provider as well.   To learn more about what you can do with MyChart, go to [email or web address].    Your next appointment:   9 month(s)  Provider:   Thurmon Fair, MD       Signed, Thurmon Fair, MD  05/29/2023 8:26 PM    [facility]

## 2023-05-29 NOTE — Patient Instructions (Signed)
Medication Instructions:  No changes *If you need a refill on your cardiac medications before your next appointment, please call your pharmacy*  Follow-Up: At [facility], you and your health needs are our priority.  As part of our continuing mission to provide you with exceptional heart care, we have created designated Provider Care Teams.  These Care Teams include your primary Cardiologist (physician) and Advanced Practice Providers (APPs -  Physician Assistants and Nurse Practitioners) who all work together to provide you with the care you need, when you need it.  We recommend signing up for the patient portal called "MyChart".  Sign up information is provided on this After Visit Summary.  MyChart is used to connect with patients for Virtual Visits (Telemedicine).  Patients are able to view lab/test results, encounter notes, upcoming appointments, etc.  Non-urgent messages can be sent to your provider as well.   To learn more about what you can do with MyChart, go to [email or web address].    Your next appointment:   9 month(s)  Provider:   Thurmon Fair, MD

## 2023-06-04 DIAGNOSIS — L570 Actinic keratosis: Secondary | ICD-10-CM | POA: Diagnosis not present

## 2023-06-04 DIAGNOSIS — L821 Other seborrheic keratosis: Secondary | ICD-10-CM | POA: Diagnosis not present

## 2023-06-04 DIAGNOSIS — L814 Other melanin hyperpigmentation: Secondary | ICD-10-CM | POA: Diagnosis not present

## 2023-06-05 ENCOUNTER — Encounter: Payer: Self-pay | Admitting: Internal Medicine

## 2023-06-10 DIAGNOSIS — R4182 Altered mental status, unspecified: Secondary | ICD-10-CM | POA: Diagnosis not present

## 2023-06-21 DIAGNOSIS — I10 Essential (primary) hypertension: Secondary | ICD-10-CM | POA: Diagnosis not present

## 2023-06-21 LAB — CBC AND DIFFERENTIAL
HCT: 34 — AB (ref 41–53)
Hemoglobin: 12.3 — AB (ref 13.5–17.5)
Platelets: 237 10*3/uL (ref 150–400)
WBC: 4.8

## 2023-06-21 LAB — BASIC METABOLIC PANEL
BUN: 12 (ref 4–21)
CO2: 24 — AB (ref 13–22)
Chloride: 92 — AB (ref 99–108)
Creatinine: 0.7 (ref 0.6–1.3)
Glucose: 95
Potassium: 4.7 meq/L (ref 3.5–5.1)
Sodium: 126 — AB (ref 137–147)

## 2023-06-21 LAB — CBC: RBC: 3.67 — AB (ref 3.87–5.11)

## 2023-06-21 LAB — COMPREHENSIVE METABOLIC PANEL
Calcium: 9 (ref 8.7–10.7)
eGFR: >90

## 2023-06-22 ENCOUNTER — Telehealth: Payer: Self-pay | Admitting: Adult Health

## 2023-06-22 NOTE — Telephone Encounter (Signed)
 Late entry 06/21/23 Nurse called to report lab results. She reports the labs were requested by his wife due to unsteady gait and increase need for assistance He has issue with hyponatremia. NA 126, prior reading 136.  Order given to increase sodium tablets Dilantin  level 26, goal 18-22 per neuro  Will hold Dilantin  x 1 day then resume. They are reaching out to neuro as well.   Follow up BMP and Dilantin  level ordered 06/24/23

## 2023-06-23 ENCOUNTER — Telehealth: Payer: Self-pay | Admitting: Neurology

## 2023-06-23 ENCOUNTER — Encounter: Payer: Self-pay | Admitting: Adult Health

## 2023-06-23 ENCOUNTER — Non-Acute Institutional Stay: Payer: Self-pay | Admitting: Adult Health

## 2023-06-23 DIAGNOSIS — R531 Weakness: Secondary | ICD-10-CM | POA: Diagnosis not present

## 2023-06-23 DIAGNOSIS — G40909 Epilepsy, unspecified, not intractable, without status epilepticus: Secondary | ICD-10-CM | POA: Diagnosis not present

## 2023-06-23 DIAGNOSIS — E871 Hypo-osmolality and hyponatremia: Secondary | ICD-10-CM

## 2023-06-23 DIAGNOSIS — J9811 Atelectasis: Secondary | ICD-10-CM | POA: Diagnosis not present

## 2023-06-23 NOTE — Telephone Encounter (Signed)
 Pt's wife, Erskine Squibb Vanorder;phenytoin (DILANTIN) 50 MG tablet  Dilantin level is 26. Pt is at [facility] and they have been trying to fax over his levels today.

## 2023-06-23 NOTE — Progress Notes (Signed)
 Location:  Medical Illustrator of Service:  ALF (13) Provider:   Bari America, ANP [facility] [phone]   Charlanne Fredia CROME, MD  Patient Care Team: Charlanne Fredia CROME, MD as PCP - General (Internal Medicine) Francyne Headland, MD as PCP - Cardiology (Cardiology) Roz Anes, MD as Consulting Physician (Ophthalmology) Gregg Lek, MD as Consulting Physician (Neurology)  Extended Emergency Contact Information Primary Emergency Contact: Gadson,[name] Address: [address]          [address] [place], [place] [address] [place] Home Phone: [phone] Mobile Phone: [phone] Relation: Spouse Secondary Emergency Contact: Fayette MADISON Ruth Home Phone: [phone] Mobile Phone: [phone] Relation: Son  Code Status:  DNR Goals of care: Advanced Directive information    03/24/2023    2:10 PM  Advanced Directives  Does Patient Have a Medical Advance Directive? Yes  Type of Advance Directive Out of facility DNR (pink MOST or yellow form);Living will  Does patient want to make changes to medical advance directive? No - Patient declined     Chief Complaint  Patient presents with   Acute Visit    Feels weak    HPI:  Pt is a 88 y.o. male seen today for an acute visit for feeling weak PMH:  afib off coumadin  due to falls, seizures, right temporal lobe resection, vagus nerve stimulator, SIADH, HTN, Grover's disease, Cardiac arrest with defibrillator, HLD, and BPH  Mr Sian reports for the past couple of weeks he has felt weak. His wife is in his room and states it started around Thanksgiving.  He has needed more assistance with ADls and has fatigue. More of a general weakness. No focal weakness. Remains ambulatory with a walker. High fall risk with hx of unsteady gait.  He has some chronic nasal congestion but denies any cough, sore throat, or sob No urinary or bowel issues No recent seizures.  Labs were drawn due to the  weakness  Dilantin  level 26.6 NA 126 BUN 11.7 Cr 0.66 WBC 4.8 Hgb 12.3 MCV 92.1 Plt 237  Labs were called to me when I was on call. We increased the sodium tablets, and held the dilantin  for 24 hrs and then resumed. Staff was reaching out to neurology. He had been drinking more water which may have triggered the low sodium    Past Medical History:  Diagnosis Date   Abnormality of gait 12/21/2013   Atrial fibrillation (HCC)    Blind left eye    Carcinoma in situ of prostate    Cardiac arrest (HCC)    Glaucoma    Hypercholesteremia    Hyperlipidemia    Metabolic encephalopathy    Mild left ventricular systolic dysfunction    Orthostatic hypotension    Presence of other specified functional implants    PSA elevation    Right frontal lobe lesion    Seizures (HCC)    Tinea pedis    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grover)    Urinary urgency    Past Surgical History:  Procedure Laterality Date   CARDIAC DEFIBRILLATOR PLACEMENT     cataract surgery     COLONOSCOPY     CRANIOTOMY Right    right anterior temporal resection   defibrillator replaced  March 2016   IMPLANTATION VAGAL NERVE STIMULATOR  June 2016   NASAL SINUS SURGERY     TONSILLECTOMY     age 36   VASECTOMY  76    Allergies  Allergen Reactions   Depakote [Divalproex  Sodium] Other (See Comments)    Arthralgias     Outpatient Encounter Medications as of 06/23/2023  Medication Sig   hydrALAZINE  (APRESOLINE ) 10 MG tablet Take 1 tablet (10 mg total) by mouth 2 (two) times daily as needed.   lacosamide  (VIMPAT ) 200 MG TABS tablet Take 1 tablet (200 mg total) by mouth 2 (two) times daily.   latanoprost  (XALATAN ) 0.005 % ophthalmic solution Place 1 drop into both eyes at bedtime.   Levetiracetam  750 MG TB24 Take 2,250 mg by mouth daily.   LORazepam  (ATIVAN ) 2 MG/ML concentrated solution Take 0.5 mg by mouth as needed for anxiety.   miconazole (MICOTIN) 2 % powder Apply 1 Application topically 2 (two) times daily.    phenytoin  (DILANTIN ) 50 MG tablet Chew 75 mg by mouth daily.   polyethylene glycol (MIRALAX  / GLYCOLAX ) packet Take 17 g by mouth every other day.   sodium chloride  1 g tablet Take 2 g by mouth 3 (three) times daily with meals. Take 2 gm in the AM  1 gram at lunch and 1 gm in the Pm   tamsulosin  (FLOMAX ) 0.4 MG CAPS capsule Take 0.4 mg by mouth daily.   triamcinolone cream (KENALOG) 0.1 % Apply 1 Application topically 2 (two) times daily as needed.   acetaminophen  (TYLENOL ) 325 MG tablet Take 650 mg by mouth every 6 (six) hours as needed for mild pain or moderate pain. (Patient not taking: Reported on 05/29/2023)   ALPRAZolam  (XANAX ) 0.25 MG tablet Take 1 tablet (0.25 mg total) by mouth 2 (two) times daily as needed for anxiety. (Patient not taking: Reported on 05/29/2023)   brimonidine -timolol  (COMBIGAN ) 0.2-0.5 % ophthalmic solution Place 1 drop into both eyes every 12 (twelve) hours.    calcium  carbonate (TUMS SMOOTHIES) 750 MG chewable tablet Chew 2 tablets by mouth 4 (four) times daily as needed for heartburn. (Patient not taking: Reported on 05/29/2023)   camphor-menthol (SARNA) lotion Apply 1 Application topically every 8 (eight) hours as needed for itching.   cetirizine  (ZYRTEC ) 10 MG tablet Take 10 mg by mouth as needed for allergies.   cholecalciferol (VITAMIN D ) 400 units TABS tablet Take 400 Units by mouth 2 (two) times daily.   clobetasol  cream (TEMOVATE ) 0.05 % Apply 1 application  topically 2 (two) times daily as needed (rash).   DILANTIN  100 MG ER capsule Take 300 mg by mouth. In the evening   econazole nitrate 1 % cream Apply 1 Application topically 2 (two) times daily as needed.   finasteride  (PROSCAR ) 5 MG tablet Take 5 mg by mouth every morning.   flecainide  (TAMBOCOR ) 50 MG tablet Take 50 mg by mouth 2 (two) times daily.   No facility-administered encounter medications on file as of 06/23/2023.    Review of Systems  Constitutional:  Positive for activity change and  fatigue. Negative for appetite change, chills, diaphoresis, fever and unexpected weight change.  HENT:  Positive for congestion.   Respiratory:  Negative for cough, shortness of breath, wheezing and stridor.   Cardiovascular:  Positive for leg swelling. Negative for chest pain and palpitations.  Gastrointestinal:  Negative for abdominal distention, abdominal pain, constipation and diarrhea.  Genitourinary:  Negative for difficulty urinating and dysuria.  Musculoskeletal:  Positive for gait problem. Negative for arthralgias, back pain, joint swelling and myalgias.  Skin:  Positive for rash (chronic issue).  Neurological:  Positive for weakness. Negative for dizziness, seizures, syncope, facial asymmetry, speech difficulty and headaches.  Hematological:  Negative for adenopathy. Does not bruise/bleed easily.  Psychiatric/Behavioral:  Negative for agitation, behavioral problems and confusion.     Immunization History  Administered Date(s) Administered   Fluad Quad(high Dose 65+) 03/22/2022, 04/08/2023   Influenza, High Dose Seasonal PF 04/07/2020   Influenza, Seasonal, Injecte, Preservative Fre 02/22/2010   Influenza,inj,Quad PF,6+ Mos 04/07/2018, 04/15/2019   Influenza-Unspecified 03/17/2013, 02/24/2014, 02/16/2015, 03/21/2016, 03/17/2017, 04/07/2018, 03/21/2021   Moderna SARS-COV2 Booster Vaccination 01/11/2021, 03/28/2021, 04/08/2023   Moderna Sars-Covid-2 Vaccination 06/28/2019, 07/27/2019, 05/01/2020, 04/22/2022, 10/09/2022   Pneumococcal Conjugate-13 07/05/2013   Pneumococcal Polysaccharide-23 06/17/2006   Td 06/18/2007   Tdap 12/07/2015, 09/17/2020   Zoster Recombinant(Shingrix) 12/04/2016   Zoster, Live 10/06/2008   Pertinent  Health Maintenance Due  Topic Date Due   INFLUENZA VACCINE  Completed      09/06/2022   10:18 AM 09/17/2022    3:12 PM 10/21/2022    1:58 PM 03/03/2023    9:30 AM 03/24/2023    2:10 PM  Fall Risk  Falls in the past year? 0 1 1 0 1  Was there an injury  with Fall? 0 1 1 0 1  Fall Risk Category Calculator 0 3 3 0 2  Patient at Risk for Falls Due to History of fall(s);Impaired balance/gait;Impaired mobility History of fall(s);Impaired mobility;Impaired balance/gait Impaired balance/gait;Impaired mobility;History of fall(s) No Fall Risks History of fall(s);Impaired balance/gait;Impaired mobility  Fall risk Follow up Falls evaluation completed Falls evaluation completed Falls evaluation completed     Functional Status Survey:    Vitals:   06/23/23 1232  BP: 134/68  Pulse: 66  Resp: 18  Temp: (!) 96.4 F (35.8 C)  SpO2: 97%  Weight: 172 lb 3.2 oz (78.1 kg)   Body mass index is 26.18 kg/m. Wt Readings from Last 3 Encounters:  06/23/23 172 lb 3.2 oz (78.1 kg)  05/29/23 170 lb 6.4 oz (77.3 kg)  03/24/23 171 lb 3.2 oz (77.7 kg)    Physical Exam Vitals and nursing note reviewed.  Constitutional:      General: He is not in acute distress.    Appearance: He is not diaphoretic.  HENT:     Head: Normocephalic and atraumatic.     Right Ear: Tympanic membrane normal.     Left Ear: Tympanic membrane normal.     Nose: Nose normal.     Mouth/Throat:     Mouth: Mucous membranes are moist.     Pharynx: Oropharynx is clear.  Eyes:     Conjunctiva/sclera: Conjunctivae normal.     Pupils: Pupils are equal, round, and reactive to light.  Neck:     Thyroid : No thyromegaly.     Vascular: No JVD.     Trachea: No tracheal deviation.  Cardiovascular:     Rate and Rhythm: Normal rate. Rhythm irregular.     Heart sounds: No murmur heard. Pulmonary:     Effort: Pulmonary effort is normal. No respiratory distress.     Breath sounds: Wheezing (LUL) and rhonchi (LUL) present.  Abdominal:     General: Bowel sounds are normal. There is no distension.     Palpations: Abdomen is soft.     Tenderness: There is no abdominal tenderness.  Musculoskeletal:     Comments: Trace edema to BLE  Lymphadenopathy:     Cervical: No cervical adenopathy.   Skin:    General: Skin is warm and dry.  Neurological:     General: No focal deficit present.     Mental Status: He is alert and oriented to person, place, and time. Mental status is at  baseline.     Cranial Nerves: No cranial nerve deficit.     Labs reviewed: Recent Labs    08/24/22 1447 08/26/22 0000 08/28/22 1345 08/30/22 0000 09/26/22 2224 10/31/22 0000 03/04/23 0000  NA 125*   < > 128*   < > 130* 136* 135*  K 4.3   < > 4.2  --  3.9 5.1 4.6  CL 92*   < > 97*   < > 99 101 98*  CO2 25   < > 25   < > 24 26* 23*  GLUCOSE 102*  --  98  --  120*  --   --   BUN 14   < > 10   < > 15 12 13   CREATININE 0.73   < > 0.62   < > 0.78 0.7 0.8  CALCIUM  9.1   < > 8.9   < > 8.4* 9.1 8.8  MG 1.7  --   --   --   --   --   --    < > = values in this interval not displayed.   Recent Labs    08/24/22 1447 08/28/22 1345 09/26/22 2224 03/04/23 0000  AST 27 20 22 25   ALT 26 16 15 18   ALKPHOS 116 116 113 148*  BILITOT 0.5 0.6 0.7  --   PROT 7.3 6.9 7.3  --   ALBUMIN 4.3 4.0 4.0 4.2   Recent Labs    08/24/22 1447 08/28/22 1345 09/26/22 2224 03/04/23 0000  WBC 4.6 5.1 5.9 6.1  NEUTROABS 1.9 2.2 3.8  --   HGB 12.0* 11.0* 10.8* 12.3*  HCT 32.3* 29.9* 31.6* 35*  MCV 88.7 89.5 93.8  --   PLT 219 212 207 226   Lab Results  Component Value Date   TSH 2.685 01/19/2022   No results found for: HGBA1C Lab Results  Component Value Date   CHOL 214 (A) 06/24/2022   HDL 83 (A) 06/24/2022   LDLCALC 141 05/15/2021   TRIG 59 06/24/2022    Significant Diagnostic Results in last 30 days:  CUP PACEART REMOTE DEVICE CHECK Result Date: 05/28/2023 Scheduled remote reviewed. Normal device function.  Next remote 91 days. MC, CVRS   Assessment/Plan  1. Weakness (Primary) Likely due to low sodium and elevated dilantin  level He has rhonchi and wheezing in the right lung Will order chest xray   2. Seizure disorder (HCC) Dilantin  held x 24 hrs Prior goal level 18-22 Resumed this  am Labs faxed to neurology for further guidance Level ordered in am  3. Hyponatremia Multifactorial chronic issue Worse now with increased water intake Encourage to return to prior fluid restriction Sodium tablets increased on 1/4 Labs ordered   Family/ staff Communication: resident and his wife Slater   Labs/tests ordered:   BMP Dilantin  level in am Also q 2 monthly BMP and Dilantin  level (discussed with his wife)   Total time :  time greater than 50% of total time spent doing pt counseling and coordination of care

## 2023-06-24 ENCOUNTER — Telehealth: Payer: Self-pay | Admitting: Internal Medicine

## 2023-06-24 DIAGNOSIS — E871 Hypo-osmolality and hyponatremia: Secondary | ICD-10-CM | POA: Diagnosis not present

## 2023-06-24 DIAGNOSIS — Z79899 Other long term (current) drug therapy: Secondary | ICD-10-CM | POA: Diagnosis not present

## 2023-06-24 MED ORDER — SODIUM CHLORIDE 1 G PO TABS: 2.0000 g | ORAL_TABLET | Freq: Three times a day (TID) | ORAL | Status: AC

## 2023-06-24 NOTE — Telephone Encounter (Signed)
 [facility] (Stique) checked Dilantin levels and was 17.2,  Sodium level 127, put a hold on patient's Dilantin on 06/22/23  for 24 hours. Called earlier today with information please review before sending orders. Contact info: [phone]

## 2023-06-24 NOTE — Telephone Encounter (Signed)
 Tabitha with [facility] requesting call back regarding pt's dilantin levels, and also states he has also been having a lot of confusion and falls lately as well. [phone]

## 2023-06-24 NOTE — Telephone Encounter (Signed)
 Sodium came back as 127 Changing his Sodium tabs to 2 gram TID Repeat BMP in 1 week

## 2023-06-24 NOTE — Telephone Encounter (Signed)
 Attempted to call back to [facility]. Unable to leave voicemail. Message sent to Dr. Teresa Coombs

## 2023-06-24 NOTE — Telephone Encounter (Signed)
 Call to [facility], spoke with Tabitha, She reports patient sodium level at 26 and sodium level was at 127 so they have increase sodium tablet to 3x daily. He has not had any seizures. He has increased confusion and multiple falls, some with head strikes. Tabitha reports neuro checks have been fine. She is faxing critical lab report and and I advised Dr. Gregg would review and will reach out with recommendations.

## 2023-06-25 NOTE — Telephone Encounter (Signed)
 Call to [facility] and spoke with Mistique, reviewed medications, lab levels and Dr. Karie Georges recommendation. Verbalized understanding. No further questions.

## 2023-06-25 NOTE — Telephone Encounter (Signed)
 Okay with increasing sodium tablet to 3 times daily. Ok to resume dilantin after holding for 24 hrs.

## 2023-07-02 DIAGNOSIS — E871 Hypo-osmolality and hyponatremia: Secondary | ICD-10-CM | POA: Diagnosis not present

## 2023-07-07 NOTE — Addendum Note (Signed)
Addended by: Elease Etienne A on: 07/07/2023 01:10 PM   Modules accepted: Orders

## 2023-07-07 NOTE — Progress Notes (Signed)
Remote ICD transmission.   

## 2023-07-14 ENCOUNTER — Telehealth: Payer: Self-pay

## 2023-07-14 NOTE — Telephone Encounter (Signed)
Cameron Potts

## 2023-07-23 DIAGNOSIS — I10 Essential (primary) hypertension: Secondary | ICD-10-CM | POA: Diagnosis not present

## 2023-07-23 DIAGNOSIS — G40001 Localization-related (focal) (partial) idiopathic epilepsy and epileptic syndromes with seizures of localized onset, not intractable, with status epilepticus: Secondary | ICD-10-CM | POA: Diagnosis not present

## 2023-08-04 ENCOUNTER — Non-Acute Institutional Stay (SKILLED_NURSING_FACILITY): Payer: Self-pay | Admitting: Internal Medicine

## 2023-08-04 ENCOUNTER — Encounter: Payer: Self-pay | Admitting: Internal Medicine

## 2023-08-04 DIAGNOSIS — R531 Weakness: Secondary | ICD-10-CM

## 2023-08-04 DIAGNOSIS — G40909 Epilepsy, unspecified, not intractable, without status epilepticus: Secondary | ICD-10-CM | POA: Diagnosis not present

## 2023-08-04 DIAGNOSIS — E871 Hypo-osmolality and hyponatremia: Secondary | ICD-10-CM

## 2023-08-04 DIAGNOSIS — R410 Disorientation, unspecified: Secondary | ICD-10-CM

## 2023-08-04 DIAGNOSIS — W19XXXA Unspecified fall, initial encounter: Secondary | ICD-10-CM | POA: Diagnosis not present

## 2023-08-04 DIAGNOSIS — R4182 Altered mental status, unspecified: Secondary | ICD-10-CM | POA: Diagnosis not present

## 2023-08-04 NOTE — Progress Notes (Unsigned)
 [facility] Room Number: 536-A Place of Service:  SNF [phone]) Provider:  Merian Capron  PCP: Mahlon Gammon, MD  Patient Care Team: Mahlon Gammon, MD as PCP - General (Internal Medicine) Thurmon Fair, MD as PCP - Cardiology (Cardiology) Jethro Bolus, MD as Consulting Physician (Ophthalmology) Windell Norfolk, MD as Consulting Physician (Neurology)  Extended Emergency Contact Information Primary Emergency Contact: Bellerose,[name] Address: [address]          [address] [place], [place] [address] [place] Home Phone: [phone] Mobile Phone: [phone] Relation: Spouse Secondary Emergency Contact: Christiana Fuchs Home Phone: [phone] Mobile Phone: [phone] Relation: Son  Code Status:  DNR Goals of care: Advanced Directive information    08/04/2023    2:25 PM  Advanced Directives  Does Patient Have a Medical Advance Directive? Yes  Type of Advance Directive Living will;Out of facility DNR (pink MOST or yellow form)  Does patient want to make changes to medical advance directive? No - Patient declined     Chief Complaint  Patient presents with   Acute Visit    Weakness and Confusion.    HPI:  Pt is a 88 y.o. male seen today for an acute visit for weakness fall and possible confusion  Lives in [place]  Patient has a history of is seizure disorder, recurrent falls, hyponatremia, and rash, cognitive impairment history of chronic A-fib s/p ICD defibrillator   Patient had Fall this morning  He says he came after doing exercise in big room and Missed his recliner and fell He denies Dizziness Just feels weak Grandson in the room He was confused initially but then he got better He says he does not have BP No Fever or Cough or nausea or Dysuria or Diarrhea  He is unable to get up from his recliner without Assist Wt Readings from Last 3 Encounters:  08/04/23 169 lb 9.6 oz (76.9 kg)  06/23/23 172 lb 3.2 oz  (78.1 kg)  05/29/23 170 lb 6.4 oz (77.3 kg)     Past Medical History:  Diagnosis Date   Abnormality of gait 12/21/2013   Atrial fibrillation (HCC)    Blind left eye    Carcinoma in situ of prostate    Cardiac arrest (HCC)    Glaucoma    Hypercholesteremia    Hyperlipidemia    Metabolic encephalopathy    Mild left ventricular systolic dysfunction    Orthostatic hypotension    Presence of other specified functional implants    PSA elevation    Right frontal lobe lesion    Seizures (HCC)    Tinea pedis    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grover)    Urinary urgency    Past Surgical History:  Procedure Laterality Date   CARDIAC DEFIBRILLATOR PLACEMENT     cataract surgery     COLONOSCOPY     CRANIOTOMY Right    right anterior temporal resection   defibrillator replaced  March 2016   IMPLANTATION VAGAL NERVE STIMULATOR  June 2016   NASAL SINUS SURGERY     TONSILLECTOMY     age 42   VASECTOMY  89    Allergies  Allergen Reactions   Depakote [Divalproex Sodium] Other (See Comments)    Arthralgias     Allergies as of 08/04/2023       Reactions   Depakote [divalproex Sodium] Other (See Comments)   Arthralgias        Medication List  Accurate as of August 04, 2023  2:25 PM. If you have any questions, ask your nurse or doctor.          acetaminophen 325 MG tablet Commonly known as: TYLENOL Take 650 mg by mouth every 6 (six) hours as needed for mild pain (pain score 1-3) or moderate pain (pain score 4-6).   ALPRAZolam 0.25 MG tablet Commonly known as: XANAX Take 1 tablet (0.25 mg total) by mouth 2 (two) times daily as needed for anxiety.   brimonidine-timolol 0.2-0.5 % ophthalmic solution Commonly known as: COMBIGAN Place 1 drop into both eyes every 12 (twelve) hours.   camphor-menthol lotion Commonly known as: SARNA Apply 1 Application topically every 8 (eight) hours as needed for itching.   cetirizine 10 MG tablet Commonly known as:  ZYRTEC Take 10 mg by mouth as needed for allergies.   cholecalciferol 10 MCG (400 UNIT) Tabs tablet Commonly known as: VITAMIN D3 Take 400 Units by mouth 2 (two) times daily.   clobetasol cream 0.05 % Commonly known as: TEMOVATE Apply 1 application  topically 2 (two) times daily as needed (rash).   phenytoin 100 MG ER capsule Commonly known as: DILANTIN Take 300 mg by mouth every evening.   Dilantin 100 MG ER capsule Generic drug: phenytoin Take 300 mg by mouth. In the evening   econazole nitrate 1 % cream Apply 1 Application topically 2 (two) times daily as needed.   finasteride 5 MG tablet Commonly known as: PROSCAR Take 5 mg by mouth every morning.   flecainide 50 MG tablet Commonly known as: TAMBOCOR Take 50 mg by mouth 2 (two) times daily.   hydrALAZINE 10 MG tablet Commonly known as: APRESOLINE Take 1 tablet (10 mg total) by mouth 2 (two) times daily as needed.   lacosamide 200 MG Tabs tablet Commonly known as: VIMPAT Take 1 tablet (200 mg total) by mouth 2 (two) times daily.   latanoprost 0.005 % ophthalmic solution Commonly known as: XALATAN Place 1 drop into both eyes at bedtime.   levETIRAcetam 750 MG 24 hr tablet Commonly known as: KEPPRA XR Take 2,250 mg by mouth daily.   LORazepam 2 MG/ML concentrated solution Commonly known as: ATIVAN Take 0.5 mg by mouth as needed for anxiety.   miconazole 2 % powder Commonly known as: MICOTIN Apply 1 Application topically 2 (two) times daily.   nystatin powder Commonly known as: MYCOSTATIN/NYSTOP Apply 1 Application topically in the morning and at bedtime.   phenytoin 50 MG tablet Commonly known as: DILANTIN Chew 75 mg by mouth in the morning. What changed: Another medication with the same name was removed. Continue taking this medication, and follow the directions you see here. Changed by: Alichia Alridge L Keniesha Adderly   polyethylene glycol 17 g packet Commonly known as: MIRALAX / GLYCOLAX Take 17 g by mouth every  other day.   polyethylene glycol 17 g packet Commonly known as: MIRALAX / GLYCOLAX Take 17 g by mouth as needed.   sodium chloride 1 g tablet Take 2 tablets (2 g total) by mouth 3 (three) times daily.   tamsulosin 0.4 MG Caps capsule Commonly known as: FLOMAX Take 0.4 mg by mouth in the morning.   triamcinolone cream 0.1 % Commonly known as: KENALOG Apply 1 Application topically 2 (two) times daily as needed.   Tums Smoothies 750 MG chewable tablet Generic drug: calcium carbonate Chew 2 tablets by mouth 4 (four) times daily as needed for heartburn.        Review of Systems  Constitutional:  Positive for activity change. Negative for appetite change and unexpected weight change.  HENT: Negative.    Respiratory:  Negative for cough and shortness of breath.   Cardiovascular:  Negative for leg swelling.  Gastrointestinal:  Negative for constipation.  Genitourinary:  Negative for frequency.  Musculoskeletal:  Positive for gait problem. Negative for arthralgias and myalgias.  Skin: Negative.  Negative for rash.  Neurological:  Positive for weakness. Negative for dizziness.  Psychiatric/Behavioral:  Positive for confusion. Negative for sleep disturbance.   All other systems reviewed and are negative.   Immunization History  Administered Date(s) Administered   Fluad Quad(high Dose 65+) 03/22/2022, 04/08/2023   Fluad Trivalent(High Dose 65+) 04/08/2023   Influenza, High Dose Seasonal PF 04/07/2020   Influenza, Seasonal, Injecte, Preservative Fre 02/22/2010   Influenza,inj,Quad PF,6+ Mos 04/07/2018, 04/15/2019   Influenza-Unspecified 03/17/2013, 02/24/2014, 02/16/2015, 03/21/2016, 03/17/2017, 04/07/2018, 03/21/2021   Moderna SARS-COV2 Booster Vaccination 01/11/2021, 03/28/2021, 04/08/2023   Moderna Sars-Covid-2 Vaccination 06/28/2019, 07/27/2019, 05/01/2020, 04/22/2022, 10/09/2022   Pneumococcal Conjugate-13 07/05/2013   Pneumococcal Polysaccharide-23 06/17/2006   Td  06/18/2007   Tdap 12/07/2015, 09/17/2020   Zoster Recombinant(Shingrix) 12/04/2016   Zoster, Live 10/06/2008   Pertinent  Health Maintenance Due  Topic Date Due   INFLUENZA VACCINE  Completed      09/06/2022   10:18 AM 09/17/2022    3:12 PM 10/21/2022    1:58 PM 03/03/2023    9:30 AM 03/24/2023    2:10 PM  Fall Risk  Falls in the past year? 0 1 1 0 1  Was there an injury with Fall? 0 1 1 0 1  Fall Risk Category Calculator 0 3 3 0 2  Patient at Risk for Falls Due to History of fall(s);Impaired balance/gait;Impaired mobility History of fall(s);Impaired mobility;Impaired balance/gait Impaired balance/gait;Impaired mobility;History of fall(s) No Fall Risks History of fall(s);Impaired balance/gait;Impaired mobility  Fall risk Follow up Falls evaluation completed Falls evaluation completed Falls evaluation completed     Functional Status Survey:    Vitals:   08/04/23 1410  BP: (!) 188/74  Pulse: 65  Resp: 19  Temp: (!) 97.3 F (36.3 C)  SpO2: 97%  Weight: 169 lb 9.6 oz (76.9 kg)  Height: 5\' 8"  (1.727 m)   Body mass index is 25.79 kg/m. Physical Exam Vitals reviewed.  Constitutional:      Appearance: Normal appearance.  HENT:     Head: Normocephalic.     Nose: Nose normal.     Mouth/Throat:     Mouth: Mucous membranes are moist.     Pharynx: Oropharynx is clear.  Eyes:     Pupils: Pupils are equal, round, and reactive to light.  Cardiovascular:     Rate and Rhythm: Normal rate and regular rhythm.     Pulses: Normal pulses.     Heart sounds: No murmur heard. Pulmonary:     Effort: Pulmonary effort is normal. No respiratory distress.     Breath sounds: Normal breath sounds. No rales.  Abdominal:     General: Abdomen is flat. Bowel sounds are normal.     Palpations: Abdomen is soft.  Musculoskeletal:        General: Swelling present.     Cervical back: Neck supple.  Skin:    General: Skin is warm.  Neurological:     General: No focal deficit present.     Mental  Status: He is alert and oriented to person, place, and time.     Comments: Was confused Initially but then cleared  up and was appropriate But still was weak and needed Assist with standing up   Psychiatric:        Mood and Affect: Mood normal.        Thought Content: Thought content normal.     Labs reviewed: Recent Labs    08/24/22 1447 08/26/22 0000 08/28/22 1345 08/30/22 0000 09/26/22 2224 10/31/22 0000 03/04/23 0000 06/21/23 0000  NA 125*   < > 128*   < > 130* 136* 135* 126*  K 4.3   < > 4.2  --  3.9 5.1 4.6 4.7  CL 92*   < > 97*   < > 99 101 98* 92*  CO2 25   < > 25   < > 24 26* 23* 24*  GLUCOSE 102*  --  98  --  120*  --   --   --   BUN 14   < > 10   < > 15 12 13 12   CREATININE 0.73   < > 0.62   < > 0.78 0.7 0.8 0.7  CALCIUM 9.1   < > 8.9   < > 8.4* 9.1 8.8 9.0  MG 1.7  --   --   --   --   --   --   --    < > = values in this interval not displayed.   Recent Labs    08/24/22 1447 08/28/22 1345 09/26/22 2224 03/04/23 0000  AST 27 20 22 25   ALT 26 16 15 18   ALKPHOS 116 116 113 148*  BILITOT 0.5 0.6 0.7  --   PROT 7.3 6.9 7.3  --   ALBUMIN 4.3 4.0 4.0 4.2   Recent Labs    08/24/22 1447 08/28/22 1345 09/26/22 2224 03/04/23 0000 06/21/23 0000  WBC 4.6 5.1 5.9 6.1 4.8  NEUTROABS 1.9 2.2 3.8  --   --   HGB 12.0* 11.0* 10.8* 12.3* 12.3*  HCT 32.3* 29.9* 31.6* 35* 34*  MCV 88.7 89.5 93.8  --   --   PLT 219 212 207 226 237   Lab Results  Component Value Date   TSH 2.685 01/19/2022   No results found for: "HGBA1C" Lab Results  Component Value Date   CHOL 214 (A) 06/24/2022   HDL 83 (A) 06/24/2022   LDLCALC 141 05/15/2021   TRIG 59 06/24/2022    Significant Diagnostic Results in last 30 days:  No results found.  Assessment/Plan 1. Fall, initial encounter (Primary) Will Order Labs CBC AND CMP Labs came back all in Normal Limits No White Count Sodium was in good range  Discussed with Nurses to have therapy see him and also Need more help in AL  as he is unable to do his ADLS  Continues to stay confused and unable to stand up Has had more falls since this morning Will Send him out for CT Scan of head  2. Weakness Labs are all WNL Therapy to eval  3. Seizure disorder (HCC) On Multiple Meds per Neurology Doe shave Follow up with them  4. Hyponatremia On Sodium tabs Sodium was good level  5. Confusion Seems at baseline later on Will Continue to follow  6 Patient has h/o A Fib but is not on any Anticoagulation due to recurrent Falls    Family/ staff Communication:   Labs/tests ordered:   Stat CBC,CMP

## 2023-08-05 ENCOUNTER — Emergency Department (HOSPITAL_COMMUNITY): Payer: Medicare Other

## 2023-08-05 ENCOUNTER — Emergency Department (HOSPITAL_COMMUNITY)
Admission: EM | Admit: 2023-08-05 | Discharge: 2023-08-05 | Disposition: A | Discharge status: Home / Self Care | Payer: Medicare Other | Attending: Emergency Medicine | Admitting: Emergency Medicine

## 2023-08-05 ENCOUNTER — Encounter (HOSPITAL_COMMUNITY): Payer: Self-pay

## 2023-08-05 ENCOUNTER — Other Ambulatory Visit: Payer: Self-pay

## 2023-08-05 DIAGNOSIS — J90 Pleural effusion, not elsewhere classified: Secondary | ICD-10-CM | POA: Diagnosis not present

## 2023-08-05 DIAGNOSIS — I10 Essential (primary) hypertension: Secondary | ICD-10-CM | POA: Diagnosis not present

## 2023-08-05 DIAGNOSIS — W1839XA Other fall on same level, initial encounter: Secondary | ICD-10-CM | POA: Insufficient documentation

## 2023-08-05 DIAGNOSIS — Y92009 Unspecified place in unspecified non-institutional (private) residence as the place of occurrence of the external cause: Secondary | ICD-10-CM | POA: Insufficient documentation

## 2023-08-05 DIAGNOSIS — Z95810 Presence of automatic (implantable) cardiac defibrillator: Secondary | ICD-10-CM | POA: Insufficient documentation

## 2023-08-05 DIAGNOSIS — G9389 Other specified disorders of brain: Secondary | ICD-10-CM | POA: Diagnosis not present

## 2023-08-05 DIAGNOSIS — I6782 Cerebral ischemia: Secondary | ICD-10-CM | POA: Diagnosis not present

## 2023-08-05 DIAGNOSIS — G40909 Epilepsy, unspecified, not intractable, without status epilepticus: Secondary | ICD-10-CM | POA: Diagnosis not present

## 2023-08-05 DIAGNOSIS — R918 Other nonspecific abnormal finding of lung field: Secondary | ICD-10-CM | POA: Diagnosis not present

## 2023-08-05 DIAGNOSIS — Z743 Need for continuous supervision: Secondary | ICD-10-CM | POA: Diagnosis not present

## 2023-08-05 DIAGNOSIS — G20C Parkinsonism, unspecified: Secondary | ICD-10-CM | POA: Insufficient documentation

## 2023-08-05 DIAGNOSIS — R296 Repeated falls: Secondary | ICD-10-CM | POA: Diagnosis not present

## 2023-08-05 DIAGNOSIS — Z79899 Other long term (current) drug therapy: Secondary | ICD-10-CM | POA: Diagnosis not present

## 2023-08-05 DIAGNOSIS — R0781 Pleurodynia: Secondary | ICD-10-CM | POA: Diagnosis not present

## 2023-08-05 DIAGNOSIS — I672 Cerebral atherosclerosis: Secondary | ICD-10-CM | POA: Diagnosis not present

## 2023-08-05 DIAGNOSIS — R531 Weakness: Secondary | ICD-10-CM | POA: Diagnosis not present

## 2023-08-05 DIAGNOSIS — I482 Chronic atrial fibrillation, unspecified: Secondary | ICD-10-CM | POA: Diagnosis not present

## 2023-08-05 DIAGNOSIS — R270 Ataxia, unspecified: Secondary | ICD-10-CM | POA: Diagnosis not present

## 2023-08-05 DIAGNOSIS — S2232XA Fracture of one rib, left side, initial encounter for closed fracture: Secondary | ICD-10-CM | POA: Diagnosis not present

## 2023-08-05 DIAGNOSIS — R4182 Altered mental status, unspecified: Secondary | ICD-10-CM | POA: Diagnosis not present

## 2023-08-05 LAB — RESP PANEL BY RT-PCR (RSV, FLU A&B, COVID)  RVPGX2
Influenza A by PCR: NEGATIVE
Influenza B by PCR: NEGATIVE
Resp Syncytial Virus by PCR: NEGATIVE
SARS Coronavirus 2 by RT PCR: NEGATIVE

## 2023-08-05 LAB — BASIC METABOLIC PANEL
Anion gap: 10 (ref 5–15)
BUN: 13 mg/dL (ref 8–23)
CO2: 24 mmol/L (ref 22–32)
Calcium: 9 mg/dL (ref 8.9–10.3)
Chloride: 100 mmol/L (ref 98–111)
Creatinine, Ser: 0.8 mg/dL (ref 0.61–1.24)
GFR, Estimated: >60 mL/min (ref >60)
Glucose, Bld: 101 mg/dL — ABNORMAL HIGH (ref 70–99)
Potassium: 4.4 mmol/L (ref 3.5–5.1)
Sodium: 134 mmol/L — ABNORMAL LOW (ref 135–145)

## 2023-08-05 LAB — URINALYSIS, ROUTINE W REFLEX MICROSCOPIC
Bilirubin Urine: NEGATIVE
Glucose, UA: NEGATIVE mg/dL
Hgb urine dipstick: NEGATIVE
Ketones, ur: NEGATIVE mg/dL
Leukocytes,Ua: NEGATIVE
Nitrite: NEGATIVE
Protein, ur: NEGATIVE mg/dL
Specific Gravity, Urine: 1.016 (ref 1.005–1.030)
pH: 6 (ref 5.0–8.0)

## 2023-08-05 LAB — CBC
HCT: 33.9 % — ABNORMAL LOW (ref 39.0–52.0)
Hemoglobin: 11.6 g/dL — ABNORMAL LOW (ref 13.0–17.0)
MCH: 32.2 pg (ref 26.0–34.0)
MCHC: 34.2 g/dL (ref 30.0–36.0)
MCV: 94.2 fL (ref 80.0–100.0)
Platelets: 209 10*3/uL (ref 150–400)
RBC: 3.6 MIL/uL — ABNORMAL LOW (ref 4.22–5.81)
RDW: 13.3 % (ref 11.5–15.5)
WBC: 6.4 10*3/uL (ref 4.0–10.5)
nRBC: 0 % (ref 0.0–0.2)

## 2023-08-05 MED ORDER — PHENYTOIN SODIUM EXTENDED 100 MG PO CAPS
300.0000 mg | ORAL_CAPSULE | Freq: Every evening | ORAL | Status: DC
  Administered 2023-08-05: 300 mg via ORAL
  Filled 2023-08-05: qty 3

## 2023-08-05 MED ORDER — LACOSAMIDE 50 MG PO TABS
200.0000 mg | ORAL_TABLET | Freq: Two times a day (BID) | ORAL | Status: DC
  Administered 2023-08-05: 200 mg via ORAL
  Filled 2023-08-05: qty 4

## 2023-08-05 MED ORDER — LEVETIRACETAM ER 750 MG PO TB24
2250.0000 mg | ORAL_TABLET | Freq: Every evening | ORAL | Status: DC
  Filled 2023-08-05 (×2): qty 3

## 2023-08-05 NOTE — ED Provider Notes (Signed)
 [place] EMERGENCY DEPARTMENT AT [facility] Provider Note   CSN: [id number] Arrival date & time: 08/05/23  1448     History  Chief Complaint  Patient presents with   Weakness    Cameron Potts is a 88 y.o. male with history of Parkinson's presenting from [facility] with concern for frequent falls, generalized weakness and slowing.  Patient ports he typically walks with a walker but has had some falls despite this.  He says he is orthostatic hypotension.  He says he is able to stand up sometimes feeling weak without assistance.  He is not on anticoagulation.  He was complaining of some left-sided mild rib pain but denied headache to me.  Patient's wife at the bedside reports that he typically does get up with a walker but has been more weak than normal.  He has been worked up for "parkinsonian-like symptoms" but has not been diagnosed with Parkinson's.  He is on several antiepileptics.  He typically is able to function fairly well independently.  He was sent in specifically for head imaging.  HPI     Home Medications Prior to Admission medications   Medication Sig Start Date End Date Taking? Authorizing Provider  acetaminophen (TYLENOL) 325 MG tablet Take 650 mg by mouth every 6 (six) hours as needed for mild pain (pain score 1-3) or moderate pain (pain score 4-6).   Yes [provider]  brimonidine-timolol (COMBIGAN) 0.2-0.5 % ophthalmic solution Place 1 drop into both eyes every 12 (twelve) hours.  01/05/18  Yes [provider]  cholecalciferol (VITAMIN D) 400 units TABS tablet Take 400 Units by mouth 2 (two) times daily.   Yes [provider]  DILANTIN 100 MG ER capsule Take 300 mg by mouth every evening. In the evening 02/14/22  Yes [provider]  finasteride (PROSCAR) 5 MG tablet Take 5 mg by mouth every morning. 02/12/19  Yes [provider]  flecainide (TAMBOCOR) 50 MG tablet Take 50 mg by mouth 2  (two) times daily.   Yes [provider]  lacosamide (VIMPAT) 200 MG TABS tablet Take 1 tablet (200 mg total) by mouth 2 (two) times daily. 05/29/23  Yes Wert, Trula Ore, NP  latanoprost (XALATAN) 0.005 % ophthalmic solution Place 1 drop into both eyes at bedtime.   Yes [provider]  Levetiracetam 750 MG TB24 Take 2,250 mg by mouth every evening.   Yes [provider]  phenytoin (DILANTIN) 50 MG tablet Chew 75 mg by mouth in the morning.   Yes [provider]  polyethylene glycol (MIRALAX / GLYCOLAX) packet Take 17 g by mouth every other day.   Yes [provider]  sodium chloride 1 g tablet Take 2 tablets (2 g total) by mouth 3 (three) times daily. 06/24/23  Yes Mahlon Gammon, MD  tamsulosin (FLOMAX) 0.4 MG CAPS capsule Take 0.4 mg by mouth in the morning.   Yes [provider]  ALPRAZolam (XANAX) 0.25 MG tablet Take 1 tablet (0.25 mg total) by mouth 2 (two) times daily as needed for anxiety. Patient not taking: Reported on 08/05/2023 09/30/22   Mahlon Gammon, MD  calcium carbonate (TUMS SMOOTHIES) 750 MG chewable tablet Chew 2 tablets by mouth 4 (four) times daily as needed for heartburn. Patient not taking: Reported on 08/05/2023    [provider]  camphor-menthol [facility]) lotion Apply 1 Application topically every 8 (eight) hours as needed for itching. Patient not taking: Reported on 08/05/2023  [provider]  cetirizine (ZYRTEC) 10 MG tablet Take 10 mg by mouth as needed for allergies. Patient not taking: Reported on 08/05/2023    [provider]  clobetasol cream (TEMOVATE) 0.05 % Apply 1 application  topically 2 (two) times daily as needed (rash). Patient not taking: Reported on 08/05/2023    [provider]  econazole nitrate 1 % cream Apply 1 Application topically 2 (two) times daily as needed. Patient not taking: Reported on 08/05/2023    [provider]  hydrALAZINE (APRESOLINE) 10 MG  tablet Take 1 tablet (10 mg total) by mouth 2 (two) times daily as needed. Patient not taking: Reported on 08/05/2023 06/25/22   Fletcher Anon, NP  LORazepam (ATIVAN) 2 MG/ML concentrated solution Take 0.5 mg by mouth as needed for anxiety. Patient not taking: Reported on 08/05/2023    [provider]  polyethylene glycol (MIRALAX / GLYCOLAX) 17 g packet Take 17 g by mouth as needed. Patient not taking: Reported on 08/05/2023    [provider]  triamcinolone cream (KENALOG) 0.1 % Apply 1 Application topically 2 (two) times daily as needed. Patient not taking: Reported on 08/05/2023    [provider]      Allergies    Depakote [divalproex sodium]    Review of Systems   Review of Systems  Physical Exam Updated Vital Signs BP (!) 196/77 (BP Location: Left Arm)   Pulse (!) 59   Temp 97.7 F (36.5 C) (Oral)   Resp 16   Ht 5\' 9"  (1.753 m)   Wt 78 kg   SpO2 99%   BMI 25.40 kg/m  Physical Exam Constitutional:      General: He is not in acute distress. HENT:     Head: Normocephalic and atraumatic.  Eyes:     Conjunctiva/sclera: Conjunctivae normal.     Pupils: Pupils are equal, round, and reactive to light.  Cardiovascular:     Rate and Rhythm: Normal rate and regular rhythm.  Pulmonary:     Effort: Pulmonary effort is normal. No respiratory distress.  Abdominal:     General: There is no distension.     Tenderness: There is no abdominal tenderness.  Skin:    General: Skin is warm and dry.  Neurological:     General: No focal deficit present.     Mental Status: He is alert and oriented to person, place, and time. Mental status is at baseline.     Comments: Patient was able to ambulate in the emergency department without any significant instability; he has equal and symmetrical strength in the upper and lower extremities; there is a mildly ataxic or shuffling gait consistent with Parkinson's  Psychiatric:        Mood and Affect: Mood normal.         Behavior: Behavior normal.     ED Results / Procedures / Treatments   Labs (all labs ordered are listed, but only abnormal results are displayed) Labs Reviewed  BASIC METABOLIC PANEL - Abnormal; Notable for the following components:      Result Value   Sodium 134 (*)    Glucose, Bld 101 (*)    All other components within normal limits  CBC - Abnormal; Notable for the following components:   RBC 3.60 (*)    Hemoglobin 11.6 (*)    HCT 33.9 (*)    All other components within normal limits  RESP PANEL BY RT-PCR (RSV, FLU A&B, COVID)  RVPGX2  URINALYSIS, ROUTINE W REFLEX  MICROSCOPIC  CBG MONITORING, ED    EKG EKG Interpretation Date/Time:  Tuesday August 05 2023 14:57:02 EST Ventricular Rate:  60 PR Interval:    QRS Duration:  128 QT Interval:  478 QTC Calculation: 478 R Axis:   43  Text Interpretation: A paced rhythm Non-specific intra-ventricular conduction block Cannot rule out Anterior infarct , age undetermined T wave abnormality, consider inferior ischemia Abnormal ECG When compared with ECG of 29-May-2023 10:27, PREVIOUS ECG IS PRESENT Confirmed by Alvester Chou [phone]) on 08/05/2023 5:00:37 PM  Radiology CT Head Wo Contrast Result Date: 08/05/2023 CLINICAL DATA:  Mental status change of unknown cause EXAM: CT HEAD WITHOUT CONTRAST TECHNIQUE: Contiguous axial images were obtained from the base of the skull through the vertex without intravenous contrast. RADIATION DOSE REDUCTION: This exam was performed according to the departmental dose-optimization program which includes automated exposure control, adjustment of the mA and/or kV according to patient size and/or use of iterative reconstruction technique. COMPARISON:  08/24/2022 FINDINGS: Brain: No change or acute finding. Previous right pterional craniotomy for right temporal lobectomy. Adjacent gliosis. No focal abnormality seen affecting the brainstem or cerebellum. Cerebral hemispheres otherwise show small-vessel  ischemic change of the white matter with an old right vertex cortical to subcortical infarction. No sign of mass lesion, hemorrhage, hydrocephalus or extra-axial collection. Vascular: There is atherosclerotic calcification of the major vessels at the base of the brain. Skull: Otherwise negative Sinuses/Orbits: Mucosal inflammatory changes of the paranasal sinuses. No advanced sinusitis. Orbits negative. Other: None IMPRESSION: No acute CT finding. Previous right pterional craniotomy for right temporal lobectomy. Adjacent gliosis. Small-vessel ischemic change of the white matter with an old right vertex cortical to subcortical infarction. Electronically Signed   By: Paulina Fusi M.D.   On: 08/05/2023 20:40   DG Chest 2 View Result Date: 08/05/2023 CLINICAL DATA:  left rib pain fx eval EXAM: CHEST - 2 VIEW COMPARISON:  Chest radiograph 09/26/2022 FINDINGS: Left-sided pacemaker in place. Right-sided stimulator with lead coursing into the neck. Stable heart size and mediastinal contours. Small left pleural effusion with ill-defined basilar opacity. No pneumothorax. Slight vascular congestion. No obvious displaced rib fracture on this non dedicated rib exam. IMPRESSION: 1. Small left pleural effusion and basilar opacity. 2. No obvious displaced rib fracture on these non dedicated rib views. Electronically Signed   By: Narda Rutherford M.D.   On: 08/05/2023 18:44    Procedures Procedures    Medications Ordered in ED Medications  levETIRAcetam (KEPPRA XR) 24 hr tablet 2,250 mg (has no administration in time range)  lacosamide (VIMPAT) tablet 200 mg (200 mg Oral Given 08/05/23 1944)  phenytoin (DILANTIN) ER capsule 300 mg (300 mg Oral Given 08/05/23 1944)    ED Course/ Medical Decision Making/ A&P Clinical Course as of 08/05/23 2115  Tue Aug 05, 2023  1828 Wife at bedside updated [MT]    Clinical Course User Index [MT] Renaye Rakers Kermit Balo, MD                                 Medical Decision  Making Amount and/or Complexity of Data Reviewed Labs: ordered. Radiology: ordered.  Risk Prescription drug management.   Patient is here with reported concern for generalized weakness.  There is not a clear timeline for how abruptly this may be changing.  Differential would include worsening Parkinson's versus infection including UTI versus electrolyte derangement or anemia versus CVA versus viral illness versus other.  Blood  sugar is normal and the patient is well-appearing on arrival.  He does not appear confused to me and is able to answer questions appropriately to me.  I personally ordered and reviewed the patient's labs and imaging, notable for no emergent findings.  I reviewed his external records including physician note from yesterday available on Care Everywhere records, reporting patient having a fall at home, history of hyponatremia seizure disorder, chronic A-fib with ICD defibrillator placement.  He was sent in for a CT scan of the head due to reported concerns of confusion.  CT imaging of the head and x-ray showed no acute emergent findings, question about infiltrate on Xray but no hypoxia, WBC elevation, or respiratory symptoms - doubt PNA  Supplemental history was provided by EMS.  Patient ambulating steadily in ED.  Supplemental history also provided by his wife.        Final Clinical Impression(s) / ED Diagnoses Final diagnoses:  Weakness    Rx / DC Orders ED Discharge Orders     None         Terald Sleeper, MD 08/05/23 2115

## 2023-08-05 NOTE — ED Triage Notes (Signed)
 Pt to ED via [facility] from [facility]. Pt has been having more falls, weakness, and slow response for past week and has been worsening. Pt is usually A&Ox4, very quick to respond, and ambulatory independently with walker. Pt is unable to stand w/o assistance now. Facility states pt did not hit his head or have LOC. Pt states he fell because he missed the chair.   Pt c/o pain in left rib. Pt confused on arrival.     EMS VS 122/ 74 99% RA Cbg 122 98.0

## 2023-08-05 NOTE — Discharge Instructions (Signed)
 CT scan of the brain is stable with no acute findings.  Labs and UA and Covid/flu testing without emergent findings.

## 2023-08-05 NOTE — ED Notes (Signed)
 [facility] called for transport to [facility]. ETA 30-45 mins

## 2023-08-05 NOTE — ED Notes (Signed)
 RN called main pharmacy to have meds verified.

## 2023-08-12 DIAGNOSIS — M6281 Muscle weakness (generalized): Secondary | ICD-10-CM | POA: Diagnosis not present

## 2023-08-12 DIAGNOSIS — R2689 Other abnormalities of gait and mobility: Secondary | ICD-10-CM | POA: Diagnosis not present

## 2023-08-12 DIAGNOSIS — G20C Parkinsonism, unspecified: Secondary | ICD-10-CM | POA: Diagnosis not present

## 2023-08-12 DIAGNOSIS — R296 Repeated falls: Secondary | ICD-10-CM | POA: Diagnosis not present

## 2023-08-14 DIAGNOSIS — G20C Parkinsonism, unspecified: Secondary | ICD-10-CM | POA: Diagnosis not present

## 2023-08-14 DIAGNOSIS — R296 Repeated falls: Secondary | ICD-10-CM | POA: Diagnosis not present

## 2023-08-14 DIAGNOSIS — R2689 Other abnormalities of gait and mobility: Secondary | ICD-10-CM | POA: Diagnosis not present

## 2023-08-14 DIAGNOSIS — M6281 Muscle weakness (generalized): Secondary | ICD-10-CM | POA: Diagnosis not present

## 2023-08-18 ENCOUNTER — Other Ambulatory Visit: Payer: Self-pay | Admitting: Adult Health

## 2023-08-18 MED ORDER — LORAZEPAM 2 MG/ML PO CONC: 1.0000 mg | ORAL | 1 refills | Status: DC | PRN

## 2023-08-19 DIAGNOSIS — R296 Repeated falls: Secondary | ICD-10-CM | POA: Diagnosis not present

## 2023-08-19 DIAGNOSIS — R2689 Other abnormalities of gait and mobility: Secondary | ICD-10-CM | POA: Diagnosis not present

## 2023-08-19 DIAGNOSIS — G20C Parkinsonism, unspecified: Secondary | ICD-10-CM | POA: Diagnosis not present

## 2023-08-19 DIAGNOSIS — M6281 Muscle weakness (generalized): Secondary | ICD-10-CM | POA: Diagnosis not present

## 2023-08-20 ENCOUNTER — Ambulatory Visit (INDEPENDENT_AMBULATORY_CARE_PROVIDER_SITE_OTHER): Payer: Medicare Other | Admitting: Neurology

## 2023-08-20 ENCOUNTER — Encounter: Payer: Self-pay | Admitting: Neurology

## 2023-08-20 VITALS — BP 118/72 | Ht 69.0 in | Wt 170.0 lb

## 2023-08-20 DIAGNOSIS — G40211 Localization-related (focal) (partial) symptomatic epilepsy and epileptic syndromes with complex partial seizures, intractable, with status epilepticus: Secondary | ICD-10-CM

## 2023-08-20 DIAGNOSIS — Z5181 Encounter for therapeutic drug level monitoring: Secondary | ICD-10-CM

## 2023-08-20 DIAGNOSIS — Z9689 Presence of other specified functional implants: Secondary | ICD-10-CM | POA: Diagnosis not present

## 2023-08-20 NOTE — Patient Instructions (Addendum)
 Decrease Dilantin to 75 mg in the morning and 200 mg at night, Dilantin goal between 15 and 18 Continue your other medications VNS Setting changed today, tolerated procedure well.  Continue to use a walker Returning 2 weeks at that time we will recheck that her Dilantin level and reassess symptoms.

## 2023-08-20 NOTE — Progress Notes (Signed)
 Reason for visit: Intractable seizures, gait disorder  Cameron Potts is an 88 y.o. male  Interval history 08/20/2023:  Patient presents today for follow-up, last visit was in September, he is accompanied by his son and wife.  They tell me since last visit he has not had any seizure or seizure-like activity.  He is compliant with his Dilantin, Keppra and Vimpat.  Family has reported that he is sleeping more, his balance is worse, he is noted to have mood swing, he is crying very easily, more emotional.  He reports some decrease in his physical activity.  Wife tells me that on a couple occasion, he had these event where he will have a jerk like motion, full body, hold onto his walker and unable to walk; he will be helped to his bed.  He needs more help than previously with bathing and dressing himself.  Cognitive exam is intact but physically he is requiring more help than previously, he has to rely more on his assistive device. He had 3 falls in the month of February.   Interval History 03/06/2023:  Cameron Potts presents today for follow-up, he is accompanied by his son.  Last visit was in March since then she has not had any seizure or seizure activity.  He is compliant with her antiseizure medications including phenytoin, Keppra and Vimpat.  He continues to swipe his VNS at least twice a day, he reports lately he has trouble on taking his Keppra, feel like he is choking, nurses at the facility are helping him with taking his medication with custard.  No other complaints.   Interval history 09/03/2022:  Cameron Potts presents today for follow-up, he is accompanied by grandson.  Last visit was in November.  Since then he has not had any seizure or seizure-like activity.  He continues to do well.  He swipes VNS twice a day at least, no adverse reaction.  He is compliant with his medication no side effects   Interval History 05/13/2022: Patient presents today for follow-up, he is accompanied by  wife.  Since last visit in August, he had 1 breakthrough seizure in September.  At that time, we had tried him on clobazam but he could not tolerate due to side effects of somnolence and confusion.  Therefore clobazam has been discontinued.  He is currently on Vimpat, Dilantin, and Keppra.  He could not tolerate higher dose of VNS change due to cough and hoarseness.  He has not had any additional seizures.  He reports that he is back to his baseline.   Interval History 02/11/2022:  Cameron Potts presents today for follow-up, he is accompanied by his wife. Since last visit in March, he has been doing well, denies any seizures, denies any side effect from the medications.  He still have balance issue and had fallen in the month of July.  He reported he bumped his head very slightly.  3 days later he started getting worse, having confusion and was taken to the ED. His head CT did not show any bleeding but his sodium was noted to be 122.  He does have chronic hyponatremia, he was seen by nephrology in the hospital.  He was told that hyponatremia was secondary to cerebral salt wasting.  He was treated and on discharge his sodium level was back to normal at 136.  In the hospital his antiseizure medications level were checked also and there were all within normal limits.  Since discharge from the hospital,  he continues to do well.  He reported he is back to his normal self now.     Interval history 09/13/2021:  Patient presents today for follow-up, she is accompanied by his wife.  Last visit with Cameron Potts was in September, at that time plan was to continue current medications.  He is known to have breakthrough seizure, and wife report that the last breakthrough seizure was 6 weeks ago.  He does have Ativan currently at the facility and usually gets it as soon as the seizure started because he is known to go into status per wife.  Ativan use to stop the seizures   History of present illness: Cameron Potts is an  88 year old right-handed white male with a history of intractable seizures.  He currently is on Dilantin, Vimpat, and Keppra and he continues to have breakthrough seizures.  He last had a brief event that occurred 2 weeks ago.  He had a fall associated with this.  He had a another seizure about a month prior to this.  He has Ativan that he receives as needed at his extended care facility.  His most recent Dilantin level on 12 March 2021 was 18.0.  The patient is also on Coumadin.  He walks with a walker.  He is getting physical therapy on a regular basis.  He has had a general decline in his balance according to his wife and according to the patient.  He has a vagal nerve stimulator in place, this was inserted in 2013.  He tolerates this fairly well.   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 He has never used smokeless tobacco. He reports that he does not currently use alcohol. He reports that he does not use  drugs.    Allergies  Allergen Reactions   Depakote [Divalproex Sodium] Other (See Comments)    Arthralgias     Medications:  Current Meds  Medication Sig   acetaminophen (TYLENOL) 325 MG tablet Take 650 mg by mouth every 6 (six) hours as needed for mild pain (pain score 1-3) or moderate pain (pain score 4-6).   ALPRAZolam (XANAX) 0.25 MG tablet Take 1 tablet (0.25 mg total) by mouth 2 (two) times daily as needed for anxiety.  brimonidine-timolol (COMBIGAN) 0.2-0.5 % ophthalmic solution Place 1 drop into both eyes every 12 (twelve) hours.    calcium carbonate (TUMS SMOOTHIES) 750 MG chewable tablet Chew 2 tablets by mouth 4 (four) times daily as needed for heartburn.   camphor-menthol (SARNA) lotion Apply 1 Application topically every 8 (eight) hours as needed for itching.   cetirizine (ZYRTEC) 10 MG tablet Take 10 mg by mouth as needed for allergies.   cholecalciferol (VITAMIN D) 400 units TABS tablet Take 400 Units by mouth 2 (two) times daily.   clobetasol cream (TEMOVATE) 0.05 % Apply 1 application  topically 2 (two) times daily as needed (rash).   DILANTIN 100 MG ER capsule Take 300 mg by mouth every evening. In the evening   econazole nitrate 1 % cream Apply 1 Application topically 2 (two) times daily as needed.   finasteride (PROSCAR) 5 MG tablet Take 5 mg by mouth every morning.   flecainide (TAMBOCOR) 50 MG tablet Take 50 mg by mouth 2 (two) times daily.   hydrALAZINE (APRESOLINE) 10 MG tablet Take 1 tablet (10 mg total) by mouth 2 (two) times daily as needed.   lacosamide (VIMPAT) 200 MG TABS tablet Take 1 tablet (200 mg total) by mouth 2 (two) times daily.   latanoprost (XALATAN) 0.005 % ophthalmic solution Place 1 drop into both eyes at bedtime.   Levetiracetam 750 MG TB24 Take 2,250 mg by mouth every evening.   LORazepam (ATIVAN) 2 MG/ML concentrated solution Take 0.5 mLs (1 mg total) by mouth as needed for seizure or anxiety.   phenytoin (DILANTIN) 50 MG tablet Chew 75 mg  by mouth in the morning.   polyethylene glycol (MIRALAX / GLYCOLAX) 17 g packet Take 17 g by mouth as needed.   polyethylene glycol (MIRALAX / GLYCOLAX) packet Take 17 g by mouth every other day.   sodium chloride 1 g tablet Take 2 tablets (2 g total) by mouth 3 (three) times daily.   tamsulosin (FLOMAX) 0.4 MG CAPS capsule Take 0.4 mg by mouth in the morning.   triamcinolone cream (KENALOG) 0.1 % Apply 1 Application topically 2 (two) times daily as needed.      ROS: Out of a complete 14 system review of symptoms, the patient complains only of the following symptoms, and all other reviewed systems are negative.  Walking difficulty Seizures  Blood pressure 118/72, height 5\' 9"  (1.753 m), weight 170 lb (77.1 kg).   Physical Exam  General: The patient is alert and cooperative at the time of the examination.  Skin: No significant peripheral edema is noted.   Neurologic Exam  Mental status: The patient is alert and oriented x 3 at the time of the examination. The patient has apparent normal recent and remote memory, with an apparently normal attention span and concentration ability.  Cranial nerves: Facial symmetry is present. Speech is normal, no aphasia or dysarthria is noted. Extraocular movements are full. Visual fields are full. Decrease facial movements.   Motor: The patient has good strength in all 4 extremities.  Sensory examination: Soft touch sensation is symmetric on the face, arms, and legs.  Coordination: The patient has good finger-nose-finger and heel-to-shin bilaterally.  Gait and station: The patient has a somewhat wide-based gait, the patient walks with a walker.  Tandem gait was not attempted.  Romberg is positive, he tends to fall backwards.   Assessment/Plan:  1.  Intractable seizures s/p VNS placement  2.  Gait disorder  He is stable on VNS and 3 antiepileptic  medications. His VNS setting were changed today and he tolerated the procedure well.  When he  comes to his somnolence, decreasing physical activity, I do suspect this is a side effect from his Dilantin.  My plan is to check the Dilantin level and decrease Dilantin to 75 mg in the morning and 200 mg in the evening and observe him for the next 2 weeks.  Will obtain a Dilantin level today and when he comes back in 2 weeks we will check the level 1 more time.  If there are no change in his dizziness, sleepiness despite lower Dilantin, we will go back to his previous dose to protect him from a seizure, standpoint.  Continue with current medications including Vimpat 200 mg BID, Keppra XR 2250 mg daily. I will see him in 2 weeks for follow, we will repeat Dilantin level. VNS settings changed today, tolerated the procedure well.    Patient Instructions  Decrease Dilantin to 75 mg in the morning and 200 mg at night, Dilantin goal between 15 and 18 Continue your other medications VNS Setting changed today, tolerated procedure well.  Continue to use a walker Returning 2 weeks at that time we will recheck that her Dilantin level and reassess symptoms.    Windell Norfolk, MD 08/22/2023 1:54 PM  [facility] [address] [place], [place] [address]  Phone [phone] Fax [phone]

## 2023-08-21 DIAGNOSIS — R2689 Other abnormalities of gait and mobility: Secondary | ICD-10-CM | POA: Diagnosis not present

## 2023-08-21 DIAGNOSIS — M6281 Muscle weakness (generalized): Secondary | ICD-10-CM | POA: Diagnosis not present

## 2023-08-21 DIAGNOSIS — R296 Repeated falls: Secondary | ICD-10-CM | POA: Diagnosis not present

## 2023-08-21 DIAGNOSIS — G20C Parkinsonism, unspecified: Secondary | ICD-10-CM | POA: Diagnosis not present

## 2023-08-21 LAB — BASIC METABOLIC PANEL
BUN/Creatinine Ratio: 21 (ref 10–24)
BUN: 16 mg/dL (ref 8–27)
CO2: 22 mmol/L (ref 20–29)
Calcium: 9.3 mg/dL (ref 8.6–10.2)
Chloride: 101 mmol/L (ref 96–106)
Creatinine, Ser: 0.75 mg/dL — ABNORMAL LOW (ref 0.76–1.27)
Glucose: 90 mg/dL (ref 70–99)
Potassium: 4.2 mmol/L (ref 3.5–5.2)
Sodium: 139 mmol/L (ref 134–144)
eGFR: 88 mL/min/{1.73_m2} (ref >59)

## 2023-08-21 LAB — PHENYTOIN LEVEL, TOTAL: Phenytoin (Dilantin), Serum: 25.1 ug/mL (ref 10.0–20.0)

## 2023-08-22 ENCOUNTER — Encounter: Payer: Self-pay | Admitting: Neurology

## 2023-08-22 ENCOUNTER — Encounter: Payer: Self-pay | Admitting: Adult Health

## 2023-08-22 DIAGNOSIS — D509 Iron deficiency anemia, unspecified: Secondary | ICD-10-CM | POA: Diagnosis not present

## 2023-08-22 DIAGNOSIS — I10 Essential (primary) hypertension: Secondary | ICD-10-CM | POA: Diagnosis not present

## 2023-08-23 DIAGNOSIS — D509 Iron deficiency anemia, unspecified: Secondary | ICD-10-CM | POA: Diagnosis not present

## 2023-08-23 DIAGNOSIS — I10 Essential (primary) hypertension: Secondary | ICD-10-CM | POA: Diagnosis not present

## 2023-08-25 ENCOUNTER — Non-Acute Institutional Stay: Payer: Medicare Other | Admitting: Adult Health

## 2023-08-25 ENCOUNTER — Encounter: Payer: Self-pay | Admitting: Adult Health

## 2023-08-25 VITALS — BP 130/86 | HR 62 | Temp 97.3°F | Resp 18 | Ht 69.0 in | Wt 170.4 lb

## 2023-08-25 DIAGNOSIS — G40909 Epilepsy, unspecified, not intractable, without status epilepticus: Secondary | ICD-10-CM | POA: Diagnosis not present

## 2023-08-25 DIAGNOSIS — N401 Enlarged prostate with lower urinary tract symptoms: Secondary | ICD-10-CM | POA: Diagnosis not present

## 2023-08-25 DIAGNOSIS — I482 Chronic atrial fibrillation, unspecified: Secondary | ICD-10-CM | POA: Diagnosis not present

## 2023-08-25 DIAGNOSIS — K5901 Slow transit constipation: Secondary | ICD-10-CM | POA: Diagnosis not present

## 2023-08-25 DIAGNOSIS — R350 Frequency of micturition: Secondary | ICD-10-CM | POA: Diagnosis not present

## 2023-08-25 DIAGNOSIS — E871 Hypo-osmolality and hyponatremia: Secondary | ICD-10-CM | POA: Diagnosis not present

## 2023-08-25 NOTE — Progress Notes (Signed)
 Location:  Medical illustrator of Service:  Clinic (12) Provider:  Tamsen Roers, MD  Patient Care Team: Mahlon Gammon, MD as PCP - General (Internal Medicine) Thurmon Fair, MD as PCP - Cardiology (Cardiology) Jethro Bolus, MD as Consulting Physician (Ophthalmology) Windell Norfolk, MD as Consulting Physician (Neurology)  Extended Emergency Contact Information Primary Emergency Contact: Whitter,[name] Address: [address]          [address] [place], [place] [address] [place] of [place] Home Phone: [phone] Mobile Phone: [phone] Relation: Spouse Secondary Emergency Contact: Christiana Fuchs Home Phone: [phone] Mobile Phone: [phone] Relation: Son  Code Status:  DNR Goals of care: Advanced Directive information    08/25/2023    2:26 PM  Advanced Directives  Does Patient Have a Medical Advance Directive? Yes  Type of Estate agent of [place];Living will;Out of facility DNR (pink MOST or yellow form)  Does patient want to make changes to medical advance directive? No - Patient declined  Copy of Healthcare Power of Attorney in Chart? Yes - validated most recent copy scanned in chart (See row information)     Chief Complaint  Patient presents with   Medical Management of Chronic Issues    5 moth follow up.    HPI:   PMH: afib off coumadin due to falls, seizures, right temporal lobe resection, vagus nerve stimulator, SIADH, HTN, Grover's disease, Cardiac arrest with defibrillator, HLD, and BPH   He has a history of seizures and is currently on Dilantin, which was recently adjusted to 75 mg in the morning and 200 mg in the evening due to high levels causing toxicity. Since the adjustment, he feels more stable with a steadier gait. He uses a vagal nerve stimulator (VNS) and has not experienced recent seizures, using the VNS to prevent onset when he senses a seizure coming.  He has a history of  hyponatremia, with a recent sodium level of 139 on March 5th. He takes sodium tablets three times a day. His appetite is normal, though he notes a decreased appetite, which he attributes to the Dilantin. No dizziness when standing slowly, but experiences it if he jumps up.  For atrial fibrillation, he is on flecainide and reports no fast heart rates or palpitations. He is no longer on Coumadin and feels relieved from not needing frequent blood draws. (Due to falls)  He is on Flomax for prostate issues and reports a weaker urine stream but feels he can empty his bladder adequately.  He takes Miralax for constipation and needs to remember to take prunes to manage bowel movements effectively.  He reports skin fragility, bruising easily, and has been using CeraVe mixed with Vaseline for Grover's disease, which has improved skin softness. He mentions hyperkeratotic lesions on his skin, which scab and fall off.  He reports sleep disturbances, feeling he sleeps too much and sometimes takes naps during the day. No depressive symptoms.  He has swelling in his legs, which has been stable. He mentions a past comment from a doctor about potential heart disease but denies any heart failure symptoms. He has home care assistance but feels he is mostly independent.  Past Medical History:  Diagnosis Date   Abnormality of gait 12/21/2013   Atrial fibrillation (HCC)    Blind left eye    Carcinoma in situ of prostate    Cardiac arrest (HCC)    Glaucoma    Hypercholesteremia    Hyperlipidemia  Metabolic encephalopathy    Mild left ventricular systolic dysfunction    Orthostatic hypotension    Presence of other specified functional implants    PSA elevation    Right frontal lobe lesion    Seizures (HCC)    Tinea pedis    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grover)    Urinary urgency    Past Surgical History:  Procedure Laterality Date   CARDIAC DEFIBRILLATOR PLACEMENT     cataract surgery      COLONOSCOPY     CRANIOTOMY Right    right anterior temporal resection   defibrillator replaced  March 2016   IMPLANTATION VAGAL NERVE STIMULATOR  June 2016   NASAL SINUS SURGERY     TONSILLECTOMY     age 39   VASECTOMY  28    Allergies  Allergen Reactions   Depakote [Divalproex Sodium] Other (See Comments)    Arthralgias     Outpatient Encounter Medications as of 08/25/2023  Medication Sig   acetaminophen (TYLENOL) 325 MG tablet Take 650 mg by mouth every 6 (six) hours as needed for mild pain (pain score 1-3) or moderate pain (pain score 4-6).   ALPRAZolam (XANAX) 0.25 MG tablet Take 1 tablet (0.25 mg total) by mouth 2 (two) times daily as needed for anxiety.   brimonidine-timolol (COMBIGAN) 0.2-0.5 % ophthalmic solution Place 1 drop into both eyes every 12 (twelve) hours.    calcium carbonate (TUMS SMOOTHIES) 750 MG chewable tablet Chew 2 tablets by mouth 4 (four) times daily as needed for heartburn.   camphor-menthol (SARNA) lotion Apply 1 Application topically every 8 (eight) hours as needed for itching.   cetirizine (ZYRTEC) 10 MG tablet Take 10 mg by mouth as needed for allergies.   cholecalciferol (VITAMIN D) 400 units TABS tablet Take 400 Units by mouth 2 (two) times daily.   clobetasol cream (TEMOVATE) 0.05 % Apply 1 application  topically 2 (two) times daily as needed (rash).   DILANTIN 100 MG ER capsule Take 300 mg by mouth every evening. In the evening   econazole nitrate 1 % cream Apply 1 Application topically 2 (two) times daily as needed.   finasteride (PROSCAR) 5 MG tablet Take 5 mg by mouth every morning.   flecainide (TAMBOCOR) 50 MG tablet Take 50 mg by mouth 2 (two) times daily.   hydrALAZINE (APRESOLINE) 10 MG tablet Take 1 tablet (10 mg total) by mouth 2 (two) times daily as needed.   lacosamide (VIMPAT) 200 MG TABS tablet Take 1 tablet (200 mg total) by mouth 2 (two) times daily.   latanoprost (XALATAN) 0.005 % ophthalmic solution Place 1 drop into both eyes at  bedtime.   Levetiracetam 750 MG TB24 Take 2,250 mg by mouth every evening.   LORazepam (ATIVAN) 2 MG/ML concentrated solution Take 0.5 mLs (1 mg total) by mouth as needed for seizure or anxiety.   phenytoin (DILANTIN) 50 MG tablet Chew 75 mg by mouth in the morning.   polyethylene glycol (MIRALAX / GLYCOLAX) 17 g packet Take 17 g by mouth as needed.   polyethylene glycol (MIRALAX / GLYCOLAX) packet Take 17 g by mouth every other day.   sodium chloride 1 g tablet Take 2 tablets (2 g total) by mouth 3 (three) times daily.   tamsulosin (FLOMAX) 0.4 MG CAPS capsule Take 0.4 mg by mouth in the morning.   triamcinolone cream (KENALOG) 0.1 % Apply 1 Application topically 2 (two) times daily as needed.   No facility-administered encounter medications on file as of 08/25/2023.  Review of Systems  Constitutional:  Negative for activity change, appetite change, chills, diaphoresis, fatigue, fever and unexpected weight change.  Respiratory:  Negative for cough, shortness of breath, wheezing and stridor.   Cardiovascular:  Positive for leg swelling. Negative for chest pain and palpitations.  Gastrointestinal:  Negative for abdominal distention, abdominal pain, constipation and diarrhea.  Genitourinary:  Negative for difficulty urinating and dysuria.  Musculoskeletal:  Positive for gait problem. Negative for arthralgias, back pain, joint swelling and myalgias.  Neurological:  Positive for weakness. Negative for dizziness, seizures, syncope, facial asymmetry, speech difficulty and headaches.  Hematological:  Negative for adenopathy. Does not bruise/bleed easily.  Psychiatric/Behavioral:  Negative for agitation, behavioral problems and confusion.     Immunization History  Administered Date(s) Administered   Fluad Quad(high Dose 65+) 03/22/2022, 04/08/2023   Fluad Trivalent(High Dose 65+) 04/08/2023   Influenza, High Dose Seasonal PF 04/07/2020   Influenza, Seasonal, Injecte, Preservative Fre  02/22/2010   Influenza,inj,Quad PF,6+ Mos 04/07/2018, 04/15/2019   Influenza-Unspecified 03/17/2013, 02/24/2014, 02/16/2015, 03/21/2016, 03/17/2017, 04/07/2018, 03/21/2021   Moderna SARS-COV2 Booster Vaccination 01/11/2021, 03/28/2021, 04/08/2023   Moderna Sars-Covid-2 Vaccination 06/28/2019, 07/27/2019, 05/01/2020, 04/22/2022, 10/09/2022   Pneumococcal Conjugate-13 07/05/2013   Pneumococcal Polysaccharide-23 06/17/2006   Td 06/18/2007   Tdap 12/07/2015, 09/17/2020   Zoster Recombinant(Shingrix) 12/04/2016   Zoster, Live 10/06/2008   Pertinent  Health Maintenance Due  Topic Date Due   INFLUENZA VACCINE  Completed      09/17/2022    3:12 PM 10/21/2022    1:58 PM 03/03/2023    9:30 AM 03/24/2023    2:10 PM 08/25/2023    2:26 PM  Fall Risk  Falls in the past year? 1 1 0 1 1  Was there an injury with Fall? 1 1 0 1 0  Fall Risk Category Calculator 3 3 0 2 1  Patient at Risk for Falls Due to History of fall(s);Impaired mobility;Impaired balance/gait Impaired balance/gait;Impaired mobility;History of fall(s) No Fall Risks History of fall(s);Impaired balance/gait;Impaired mobility Impaired balance/gait;Impaired mobility  Fall risk Follow up Falls evaluation completed Falls evaluation completed   Falls evaluation completed   Functional Status Survey:    Vitals:   08/22/23 1655  BP: 130/86  Pulse: 62  Resp: 18  Temp: (!) 97.3 F (36.3 C)  SpO2: 97%  Weight: 170 lb 6.4 oz (77.3 kg)  Height: 5\' 9"  (1.753 m)   Body mass index is 25.16 kg/m. Physical Exam Vitals and nursing note reviewed.  Constitutional:      General: He is not in acute distress.    Appearance: He is not diaphoretic.  HENT:     Head: Normocephalic and atraumatic.     Right Ear: Tympanic membrane normal.     Left Ear: Tympanic membrane normal.     Nose: Nose normal.     Mouth/Throat:     Mouth: Mucous membranes are moist.     Pharynx: Oropharynx is clear.  Eyes:     Conjunctiva/sclera: Conjunctivae normal.      Pupils: Pupils are equal, round, and reactive to light.  Neck:     Thyroid: No thyromegaly.     Vascular: No JVD.     Trachea: No tracheal deviation.  Cardiovascular:     Rate and Rhythm: Normal rate. Rhythm irregular.     Heart sounds: No murmur heard. Pulmonary:     Effort: Pulmonary effort is normal. No respiratory distress.     Breath sounds: Normal breath sounds. No wheezing.  Abdominal:     General:  Bowel sounds are normal. There is no distension.     Palpations: Abdomen is soft.     Tenderness: There is no abdominal tenderness.  Musculoskeletal:     Comments: BLE edema +1  Lymphadenopathy:     Cervical: No cervical adenopathy.  Skin:    General: Skin is warm and dry.     Comments: Xerosis Hyperkeratotic lesion to right forearm  Neurological:     General: No focal deficit present.     Mental Status: He is alert. Mental status is at baseline.     Cranial Nerves: No cranial nerve deficit.  Psychiatric:        Mood and Affect: Mood normal.     Labs reviewed: Recent Labs    09/26/22 2224 10/31/22 0000 06/21/23 0000 08/05/23 1457 08/20/23 1501  NA 130*   < > 126* 134* 139  K 3.9   < > 4.7 4.4 4.2  CL 99   < > 92* 100 101  CO2 24   < > 24* 24 22  GLUCOSE 120*  --   --  101* 90  BUN 15   < > 12 13 16   CREATININE 0.78   < > 0.7 0.80 0.75*  CALCIUM 8.4*   < > 9.0 9.0 9.3   < > = values in this interval not displayed.   Recent Labs    08/28/22 1345 09/26/22 2224 03/04/23 0000  AST 20 22 25   ALT 16 15 18   ALKPHOS 116 113 148*  BILITOT 0.6 0.7  --   PROT 6.9 7.3  --   ALBUMIN 4.0 4.0 4.2   Recent Labs    08/28/22 1345 09/26/22 2224 03/04/23 0000 06/21/23 0000 08/05/23 1457  WBC 5.1 5.9 6.1 4.8 6.4  NEUTROABS 2.2 3.8  --   --   --   HGB 11.0* 10.8* 12.3* 12.3* 11.6*  HCT 29.9* 31.6* 35* 34* 33.9*  MCV 89.5 93.8  --   --  94.2  PLT 212 207 226 237 209   Lab Results  Component Value Date   TSH 2.685 01/19/2022   No results found for:  "HGBA1C" Lab Results  Component Value Date   CHOL 214 (A) 06/24/2022   HDL 83 (A) 06/24/2022   LDLCALC 141 05/15/2021   TRIG 59 06/24/2022    Significant Diagnostic Results in last 30 days:  Vagal Nerve Stimulation Result Date: 08/20/2023 Windell Norfolk, MD     08/25/2023 10:48 AM   CT Head Wo Contrast Result Date: 08/05/2023 CLINICAL DATA:  Mental status change of unknown cause EXAM: CT HEAD WITHOUT CONTRAST TECHNIQUE: Contiguous axial images were obtained from the base of the skull through the vertex without intravenous contrast. RADIATION DOSE REDUCTION: This exam was performed according to the departmental dose-optimization program which includes automated exposure control, adjustment of the mA and/or kV according to patient size and/or use of iterative reconstruction technique. COMPARISON:  08/24/2022 FINDINGS: Brain: No change or acute finding. Previous right pterional craniotomy for right temporal lobectomy. Adjacent gliosis. No focal abnormality seen affecting the brainstem or cerebellum. Cerebral hemispheres otherwise show small-vessel ischemic change of the white matter with an old right vertex cortical to subcortical infarction. No sign of mass lesion, hemorrhage, hydrocephalus or extra-axial collection. Vascular: There is atherosclerotic calcification of the major vessels at the base of the brain. Skull: Otherwise negative Sinuses/Orbits: Mucosal inflammatory changes of the paranasal sinuses. No advanced sinusitis. Orbits negative. Other: None IMPRESSION: No acute CT finding. Previous right pterional craniotomy for right  temporal lobectomy. Adjacent gliosis. Small-vessel ischemic change of the white matter with an old right vertex cortical to subcortical infarction. Electronically Signed   By: Paulina Fusi M.D.   On: 08/05/2023 20:40   DG Chest 2 View Result Date: 08/05/2023 CLINICAL DATA:  left rib pain fx eval EXAM: CHEST - 2 VIEW COMPARISON:  Chest radiograph 09/26/2022 FINDINGS:  Left-sided pacemaker in place. Right-sided stimulator with lead coursing into the neck. Stable heart size and mediastinal contours. Small left pleural effusion with ill-defined basilar opacity. No pneumothorax. Slight vascular congestion. No obvious displaced rib fracture on this non dedicated rib exam. IMPRESSION: 1. Small left pleural effusion and basilar opacity. 2. No obvious displaced rib fracture on these non dedicated rib views. Electronically Signed   By: Narda Rutherford M.D.   On: 08/05/2023 18:44    Assessment/Plan  Seizure disorder Followed by neurology Recent adjustment of Dilantin dosage due to toxicity and gait instability. Current dosage is 75mg  in the morning and 200mg  in the evening. Vagal nerve stimulator settings were also adjusted. No recent seizure activity reported. -Continue current Dilantin dosage and monitor for improvement in gait stability and lethargy. -Check Dilantin level in two weeks.  Atrial Fibrillation Stable on Flecainide. No recent palpitations reported. -Continue Flecainide as prescribed.  Benign Prostatic Hyperplasia Reports weaker urine stream but able to empty bladder adequately. Currently on Flomax. -Continue Flomax as prescribed.  Constipation Managed with Miralax and prunes. -Continue current regimen and ensure regular intake of prunes.  Hyponatremia Sodium level was 139 on March 5th. Currently on sodium tablets. -Continue sodium tablets as prescribed.  Skin Condition (Grover's disease) Reports improvement in skin texture with use of CeraVe and Vaseline. Noted hyperkeratotic lesions and bruising. -Continue current skin care regimen.  Hx of orthostatic hypotension Rise slowly  General Health Maintenance / Followup Plans -Schedule follow-up appointment in four months.    Labs/tests ordered:  F/U 4 months with Dr Chales Abrahams with Lipid panel and TSH with next scheduled labs.     Total time :  time greater than 50% of total time spent  doing pt counseling and coordination of care

## 2023-08-26 ENCOUNTER — Ambulatory Visit (INDEPENDENT_AMBULATORY_CARE_PROVIDER_SITE_OTHER): Payer: PRIVATE HEALTH INSURANCE

## 2023-08-26 DIAGNOSIS — M6281 Muscle weakness (generalized): Secondary | ICD-10-CM | POA: Diagnosis not present

## 2023-08-26 DIAGNOSIS — G20C Parkinsonism, unspecified: Secondary | ICD-10-CM | POA: Diagnosis not present

## 2023-08-26 DIAGNOSIS — R296 Repeated falls: Secondary | ICD-10-CM | POA: Diagnosis not present

## 2023-08-26 DIAGNOSIS — R2689 Other abnormalities of gait and mobility: Secondary | ICD-10-CM | POA: Diagnosis not present

## 2023-08-26 DIAGNOSIS — I495 Sick sinus syndrome: Secondary | ICD-10-CM

## 2023-08-27 LAB — CUP PACEART REMOTE DEVICE CHECK
Battery Remaining Longevity: 8 mo
Battery Voltage: 2.82 V
Brady Statistic AP VP Percent: 0.2 %
Brady Statistic AP VS Percent: 99.63 %
Brady Statistic AS VP Percent: 0 %
Brady Statistic AS VS Percent: 0.16 %
Brady Statistic RA Percent Paced: 99.83 %
Brady Statistic RV Percent Paced: 0.2 %
Date Time Interrogation Session: 20250311001805
HighPow Impedance: 68 Ohm
Lead Channel Impedance Value: 361 Ohm
Lead Channel Impedance Value: 418 Ohm
Lead Channel Impedance Value: 513 Ohm
Lead Channel Pacing Threshold Amplitude: 0.75 V
Lead Channel Pacing Threshold Amplitude: 1.625 V
Lead Channel Pacing Threshold Pulse Width: 0.4 ms
Lead Channel Pacing Threshold Pulse Width: 0.4 ms
Lead Channel Sensing Intrinsic Amplitude: 1.375 mV
Lead Channel Sensing Intrinsic Amplitude: 1.375 mV
Lead Channel Sensing Intrinsic Amplitude: 5.875 mV
Lead Channel Sensing Intrinsic Amplitude: 5.875 mV
Lead Channel Setting Pacing Amplitude: 2 V
Lead Channel Setting Pacing Amplitude: 3.5 V
Lead Channel Setting Pacing Pulse Width: 0.4 ms
Lead Channel Setting Sensing Sensitivity: 0.3 mV
Zone Setting Status: 755011

## 2023-08-28 ENCOUNTER — Encounter: Payer: Self-pay | Admitting: Cardiovascular Disease

## 2023-08-28 DIAGNOSIS — R296 Repeated falls: Secondary | ICD-10-CM | POA: Diagnosis not present

## 2023-08-28 DIAGNOSIS — R2689 Other abnormalities of gait and mobility: Secondary | ICD-10-CM | POA: Diagnosis not present

## 2023-08-28 DIAGNOSIS — G20C Parkinsonism, unspecified: Secondary | ICD-10-CM | POA: Diagnosis not present

## 2023-08-28 DIAGNOSIS — M6281 Muscle weakness (generalized): Secondary | ICD-10-CM | POA: Diagnosis not present

## 2023-09-01 ENCOUNTER — Encounter: Admitting: Adult Health

## 2023-09-02 DIAGNOSIS — R296 Repeated falls: Secondary | ICD-10-CM | POA: Diagnosis not present

## 2023-09-02 DIAGNOSIS — G20C Parkinsonism, unspecified: Secondary | ICD-10-CM | POA: Diagnosis not present

## 2023-09-02 DIAGNOSIS — R2689 Other abnormalities of gait and mobility: Secondary | ICD-10-CM | POA: Diagnosis not present

## 2023-09-02 DIAGNOSIS — M6281 Muscle weakness (generalized): Secondary | ICD-10-CM | POA: Diagnosis not present

## 2023-09-03 DIAGNOSIS — L570 Actinic keratosis: Secondary | ICD-10-CM | POA: Diagnosis not present

## 2023-09-03 DIAGNOSIS — L821 Other seborrheic keratosis: Secondary | ICD-10-CM | POA: Diagnosis not present

## 2023-09-03 DIAGNOSIS — L814 Other melanin hyperpigmentation: Secondary | ICD-10-CM | POA: Diagnosis not present

## 2023-09-04 ENCOUNTER — Ambulatory Visit (INDEPENDENT_AMBULATORY_CARE_PROVIDER_SITE_OTHER): Admitting: Neurology

## 2023-09-04 ENCOUNTER — Encounter: Payer: Self-pay | Admitting: Neurology

## 2023-09-04 VITALS — BP 136/88 | HR 64 | Ht 69.0 in | Wt 170.0 lb

## 2023-09-04 DIAGNOSIS — G20C Parkinsonism, unspecified: Secondary | ICD-10-CM | POA: Diagnosis not present

## 2023-09-04 DIAGNOSIS — Z5181 Encounter for therapeutic drug level monitoring: Secondary | ICD-10-CM

## 2023-09-04 DIAGNOSIS — Z9689 Presence of other specified functional implants: Secondary | ICD-10-CM

## 2023-09-04 DIAGNOSIS — G40211 Localization-related (focal) (partial) symptomatic epilepsy and epileptic syndromes with complex partial seizures, intractable, with status epilepticus: Secondary | ICD-10-CM | POA: Diagnosis not present

## 2023-09-04 DIAGNOSIS — R296 Repeated falls: Secondary | ICD-10-CM | POA: Diagnosis not present

## 2023-09-04 DIAGNOSIS — R2689 Other abnormalities of gait and mobility: Secondary | ICD-10-CM | POA: Diagnosis not present

## 2023-09-04 DIAGNOSIS — M6281 Muscle weakness (generalized): Secondary | ICD-10-CM | POA: Diagnosis not present

## 2023-09-04 NOTE — Patient Instructions (Signed)
 Continue with Dilantin 75 mg in the morning and 200 mg in the evening  Will check a Dilantin level with BMP  Continue your other medications  Call for any seizures or any other concerns  Return in 6 months or sooner if worse

## 2023-09-04 NOTE — Progress Notes (Signed)
 Reason for visit: Intractable seizures, gait disorder  Cameron Potts is an 88 y.o. male  Interval history 09/04/2023:  Patient presents today for follow-up, he is accompanied by son.  Last visit was 2 weeks ago.  At that time he was complaining of worsening balance, more somnolence and mood swing.  We checked Dilantin level this was 25.1. We decreased his Dilantin to 75 mg in the morning and 200 mg in the evening and since then he has been doing better, less sleepy and his balance is better. No seizures    Interval history 08/20/2023:  Patient presents today for follow-up, last visit was in September, he is accompanied by his son and wife.  They tell me since last visit he has not had any seizure or seizure-like activity.  He is compliant with his Dilantin, Keppra and Vimpat.  Family has reported that he is sleeping more, his balance is worse, he is noted to have mood swing, he is crying very easily, more emotional.  He reports some decrease in his physical activity.  Wife tells me that on a couple occasion, he had these event where he will have a jerk like motion, full body, hold onto his walker and unable to walk; he will be helped to his bed.  He needs more help than previously with bathing and dressing himself.  Cognitive exam is intact but physically he is requiring more help than previously, he has to rely more on his assistive device. He had 3 falls in the month of February.   Interval History 03/06/2023:  Mr. Cameron Potts presents today for follow-up, he is accompanied by his son.  Last visit was in March since then she has not had any seizure or seizure activity.  He is compliant with her antiseizure medications including phenytoin, Keppra and Vimpat.  He continues to swipe his VNS at least twice a day, he reports lately he has trouble on taking his Keppra, feel like he is choking, nurses at the facility are helping him with taking his medication with custard.  No other  complaints.   Interval history 09/03/2022:  Mr. Cameron Potts presents today for follow-up, he is accompanied by grandson.  Last visit was in November.  Since then he has not had any seizure or seizure-like activity.  He continues to do well.  He swipes VNS twice a day at least, no adverse reaction.  He is compliant with his medication no side effects   Interval History 05/13/2022: Patient presents today for follow-up, he is accompanied by wife.  Since last visit in August, he had 1 breakthrough seizure in September.  At that time, we had tried him on clobazam but he could not tolerate due to side effects of somnolence and confusion.  Therefore clobazam has been discontinued.  He is currently on Vimpat, Dilantin, and Keppra.  He could not tolerate higher dose of VNS change due to cough and hoarseness.  He has not had any additional seizures.  He reports that he is back to his baseline.   Interval History 02/11/2022:  [name] presents today for follow-up, he is accompanied by his wife. Since last visit in March, he has been doing well, denies any seizures, denies any side effect from the medications.  He still have balance issue and had fallen in the month of July.  He reported he bumped his head very slightly.  3 days later he started getting worse, having confusion and was taken to the ED. His head  CT did not show any bleeding but his sodium was noted to be 122.  He does have chronic hyponatremia, he was seen by nephrology in the hospital.  He was told that hyponatremia was secondary to cerebral salt wasting.  He was treated and on discharge his sodium level was back to normal at 136.  In the hospital his antiseizure medications level were checked also and there were all within normal limits.  Since discharge from the hospital, he continues to do well.  He reported he is back to his normal self now.     Interval history 09/13/2021:  Patient presents today for follow-up, she is accompanied by his wife.   Last visit with Dr. Anne Potts was in September, at that time plan was to continue current medications.  He is known to have breakthrough seizure, and wife report that the last breakthrough seizure was 6 weeks ago.  He does have Ativan currently at the facility and usually gets it as soon as the seizure started because he is known to go into status per wife.  Ativan use to stop the seizures   History of present illness: Cameron Potts is an 88 year old right-handed white male with a history of intractable seizures.  He currently is on Dilantin, Vimpat, and Keppra and he continues to have breakthrough seizures.  He last had a brief event that occurred 2 weeks ago.  He had a fall associated with this.  He had a another seizure about a month prior to this.  He has Ativan that he receives as needed at his extended care facility.  His most recent Dilantin level on 12 March 2021 was 18.0.  The patient is also on Coumadin.  He walks with a walker.  He is getting physical therapy on a regular basis.  He has had a general decline in his balance according to his wife and according to the patient.  He has a vagal nerve stimulator in place, this was inserted in 2013.  He tolerates this fairly well.   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 He has never used smokeless tobacco. He reports that he does not currently use alcohol. He reports that he does not use drugs.    Allergies  Allergen Reactions   Depakote [Divalproex Sodium] Other (See Comments)    Arthralgias     Medications:  Current Meds  Medication Sig   acetaminophen (TYLENOL) 325 MG tablet Take 650 mg by mouth every 6 (six) hours as needed for mild pain (pain score 1-3) or moderate pain (pain score 4-6).   ALPRAZolam (XANAX) 0.25 MG tablet Take 1 tablet (0.25 mg total) by mouth 2 (two) times daily as needed for anxiety.   brimonidine-timolol (COMBIGAN) 0.2-0.5 % ophthalmic solution Place 1 drop into both eyes every 12 (twelve) hours.    calcium carbonate (TUMS SMOOTHIES) 750 MG chewable tablet Chew 2 tablets by mouth 4 (four) times daily as needed for heartburn.   camphor-menthol (SARNA) lotion Apply 1 Application topically every 8 (eight) hours as needed for itching.   cetirizine (ZYRTEC) 10 MG tablet Take 10 mg by mouth as needed for allergies.   cholecalciferol (VITAMIN D) 400 units TABS tablet Take 400 Units by mouth 2 (two) times daily.   clobetasol cream (TEMOVATE) 0.05 % Apply 1 application  topically 2 (two) times daily as needed (rash).   DILANTIN 100 MG ER capsule Take 200 mg by mouth daily. Take 100 mg 2 capsules evening. As of 08/20/23)   econazole nitrate 1 % cream Apply 1 Application topically 2 (two) times daily as needed.   finasteride (PROSCAR) 5 MG tablet Take 5 mg by mouth every morning.   flecainide (TAMBOCOR) 50 MG tablet Take 50 mg by mouth 2 (two) times daily.   hydrALAZINE (APRESOLINE) 10 MG tablet Take 1 tablet (10 mg total) by  mouth 2 (two) times daily as needed.   lacosamide (VIMPAT) 200 MG TABS tablet Take 1 tablet (200 mg total) by mouth 2 (two) times daily.   latanoprost (XALATAN) 0.005 % ophthalmic solution Place 1 drop into both eyes at bedtime.   Levetiracetam 750 MG TB24 Take 2,250 mg by mouth every evening.   LORazepam (ATIVAN) 2 MG/ML concentrated solution Take 0.5 mLs (1 mg total) by mouth as needed for seizure or anxiety.   phenytoin (DILANTIN) 50 MG tablet Chew 75 mg by mouth in the morning.   polyethylene glycol (MIRALAX / GLYCOLAX) 17 g packet Take 17 g by mouth as needed.   polyethylene glycol (MIRALAX / GLYCOLAX) packet Take 17 g by mouth every other day.   sodium chloride 1 g tablet Take 2 tablets (2 g total) by mouth 3 (three) times daily.   tamsulosin (FLOMAX) 0.4 MG CAPS capsule Take 0.4 mg by mouth in the morning.   triamcinolone cream (KENALOG) 0.1 % Apply 1 Application topically 2 (two) times daily as needed.      ROS: Out of a complete 14 system review of symptoms, the patient complains only of the following symptoms, and all other reviewed systems are negative.  Walking difficulty Seizures  Blood pressure 136/88, pulse 64, height 5\' 9"  (1.753 m), weight 170 lb (77.1 kg).   Physical Exam  General: The patient is alert and cooperative at the time of the examination.  Skin: No significant peripheral edema is noted.   Neurologic Exam  Mental status: The patient is alert and oriented x 3 at the time of the examination. The patient has apparent normal recent and remote memory, with an apparently normal attention span and concentration ability.  Cranial nerves: Facial symmetry is present. Speech is normal, no aphasia or dysarthria is noted. Extraocular movements are full. Visual fields are full. Decrease facial movements.   Motor: The patient has  good strength in all 4 extremities.  Sensory examination: Soft touch sensation is symmetric on the face, arms, and legs.  Coordination:  The patient has good finger-nose-finger and heel-to-shin bilaterally.  Gait and station: The patient has a somewhat wide-based gait, the patient walks with a walker.  Tandem gait was not attempted.  Romberg is positive, he tends to fall backwards.   Assessment/Plan:  1.  Intractable seizures s/p VNS placement  2.  Gait disorder  He is stable on VNS and 3 antiepileptic medications. Doing better since Dilantin reduced, we will check a level again and will continue him on Dilantin to 75 mg in the morning and 200 mg in the evening. My goal is a Dilantin level between [15 to 25].  Continue with your other medications including Vimpat 200 mg BID, Keppra XR 2250 mg daily.  I will call him to update him, otherwise follow up in 6 months or sooner if worse.     Patient Instructions  Continue with Dilantin 75 mg in the morning and 200 mg in the evening  Will check a Dilantin level with BMP  Continue your other medications  Call for any seizures or any other concerns  Return in 6 months or sooner if worse      Windell Norfolk, MD 09/04/2023 4:32 PM  [facility] [address] [place], [place] [address]  Phone [phone] Fax [phone]

## 2023-09-05 ENCOUNTER — Encounter: Payer: Self-pay | Admitting: Neurology

## 2023-09-05 LAB — BASIC METABOLIC PANEL
BUN/Creatinine Ratio: 16 (ref 10–24)
BUN: 13 mg/dL (ref 8–27)
CO2: 23 mmol/L (ref 20–29)
Calcium: 9 mg/dL (ref 8.6–10.2)
Chloride: 101 mmol/L (ref 96–106)
Creatinine, Ser: 0.81 mg/dL (ref 0.76–1.27)
Glucose: 94 mg/dL (ref 70–99)
Potassium: 4.1 mmol/L (ref 3.5–5.2)
Sodium: 138 mmol/L (ref 134–144)
eGFR: 86 mL/min/{1.73_m2} (ref >59)

## 2023-09-05 LAB — PHENYTOIN LEVEL, TOTAL: Phenytoin (Dilantin), Serum: 12.4 ug/mL (ref 10.0–20.0)

## 2023-09-09 DIAGNOSIS — G20C Parkinsonism, unspecified: Secondary | ICD-10-CM | POA: Diagnosis not present

## 2023-09-09 DIAGNOSIS — R296 Repeated falls: Secondary | ICD-10-CM | POA: Diagnosis not present

## 2023-09-09 DIAGNOSIS — M6281 Muscle weakness (generalized): Secondary | ICD-10-CM | POA: Diagnosis not present

## 2023-09-09 DIAGNOSIS — R2689 Other abnormalities of gait and mobility: Secondary | ICD-10-CM | POA: Diagnosis not present

## 2023-09-11 ENCOUNTER — Telehealth: Payer: Self-pay | Admitting: Neurology

## 2023-09-11 DIAGNOSIS — G20C Parkinsonism, unspecified: Secondary | ICD-10-CM | POA: Diagnosis not present

## 2023-09-11 DIAGNOSIS — R296 Repeated falls: Secondary | ICD-10-CM | POA: Diagnosis not present

## 2023-09-11 DIAGNOSIS — R2689 Other abnormalities of gait and mobility: Secondary | ICD-10-CM | POA: Diagnosis not present

## 2023-09-11 DIAGNOSIS — M6281 Muscle weakness (generalized): Secondary | ICD-10-CM | POA: Diagnosis not present

## 2023-09-11 NOTE — Telephone Encounter (Signed)
 Call back to Melida Gimenez, RN and advised of recheck dilantin level next week and if remains low will make medication adjustments. She verbalzied understanding

## 2023-09-11 NOTE — Telephone Encounter (Signed)
 Tabitha from [facility] called needing to speak to the Rn or MD regarding the pt's  DILANTIN 100 MG ER capsule level. She states that the range is to be 15-18 but the pt's level is a 12. Please advise.

## 2023-09-11 NOTE — Telephone Encounter (Signed)
 Call to tabitha, patients RN at [facility]. She states that his dilantin level was a 12 and they are very concerned about it being a low reading. She denies any seizures and reports medications compliance.

## 2023-09-11 NOTE — Telephone Encounter (Signed)
 For Mr. Klemens we are shooting for a Dilantin level between 15-18. Please have them check Dilantin level next week and if below 15, we will make some adjustment.

## 2023-09-15 ENCOUNTER — Ambulatory Visit: Payer: Medicare Other | Admitting: Neurology

## 2023-09-15 DIAGNOSIS — Z5181 Encounter for therapeutic drug level monitoring: Secondary | ICD-10-CM | POA: Diagnosis not present

## 2023-09-16 ENCOUNTER — Telehealth: Payer: Self-pay | Admitting: Neurology

## 2023-09-16 DIAGNOSIS — R296 Repeated falls: Secondary | ICD-10-CM | POA: Diagnosis not present

## 2023-09-16 DIAGNOSIS — M6281 Muscle weakness (generalized): Secondary | ICD-10-CM | POA: Diagnosis not present

## 2023-09-16 DIAGNOSIS — G20C Parkinsonism, unspecified: Secondary | ICD-10-CM | POA: Diagnosis not present

## 2023-09-16 DIAGNOSIS — R2689 Other abnormalities of gait and mobility: Secondary | ICD-10-CM | POA: Diagnosis not present

## 2023-09-16 NOTE — Telephone Encounter (Signed)
 Please advise Cameron Potts, no need to change Dilantin, please continue with 75 mg in the morning and 200 mg in the evening.

## 2023-09-16 NOTE — Telephone Encounter (Signed)
 [facility]/ Tabitha reporting Dilantin levels: drew on 09/15/23; level 14.4,  but Dr. Teresa Coombs set limit 15 to 18. Want to know if patient needs a change in his Dilantin that he takes. Currentlyh takes 75mg  in the morning and takes 200 mg in evening. Would like a call back.

## 2023-09-16 NOTE — Telephone Encounter (Signed)
 Called and relayed information to Cameron Potts no need to change Dilantin, please continue with 75 mg in the morning and 200 mg in the evening.

## 2023-09-18 DIAGNOSIS — M6281 Muscle weakness (generalized): Secondary | ICD-10-CM | POA: Diagnosis not present

## 2023-09-18 DIAGNOSIS — R296 Repeated falls: Secondary | ICD-10-CM | POA: Diagnosis not present

## 2023-09-18 DIAGNOSIS — R2689 Other abnormalities of gait and mobility: Secondary | ICD-10-CM | POA: Diagnosis not present

## 2023-09-18 DIAGNOSIS — G20C Parkinsonism, unspecified: Secondary | ICD-10-CM | POA: Diagnosis not present

## 2023-09-23 DIAGNOSIS — R2689 Other abnormalities of gait and mobility: Secondary | ICD-10-CM | POA: Diagnosis not present

## 2023-09-23 DIAGNOSIS — G20C Parkinsonism, unspecified: Secondary | ICD-10-CM | POA: Diagnosis not present

## 2023-09-23 DIAGNOSIS — R296 Repeated falls: Secondary | ICD-10-CM | POA: Diagnosis not present

## 2023-09-23 DIAGNOSIS — M6281 Muscle weakness (generalized): Secondary | ICD-10-CM | POA: Diagnosis not present

## 2023-09-25 ENCOUNTER — Other Ambulatory Visit: Payer: Self-pay | Admitting: Adult Health

## 2023-09-25 DIAGNOSIS — G40919 Epilepsy, unspecified, intractable, without status epilepticus: Secondary | ICD-10-CM

## 2023-09-25 MED ORDER — LACOSAMIDE 200 MG PO TABS: 200.0000 mg | ORAL_TABLET | Freq: Two times a day (BID) | ORAL | 3 refills | Status: DC

## 2023-09-30 DIAGNOSIS — R2689 Other abnormalities of gait and mobility: Secondary | ICD-10-CM | POA: Diagnosis not present

## 2023-09-30 DIAGNOSIS — M6281 Muscle weakness (generalized): Secondary | ICD-10-CM | POA: Diagnosis not present

## 2023-09-30 DIAGNOSIS — G20C Parkinsonism, unspecified: Secondary | ICD-10-CM | POA: Diagnosis not present

## 2023-09-30 DIAGNOSIS — R296 Repeated falls: Secondary | ICD-10-CM | POA: Diagnosis not present

## 2023-10-09 DIAGNOSIS — R296 Repeated falls: Secondary | ICD-10-CM | POA: Diagnosis not present

## 2023-10-09 DIAGNOSIS — G20C Parkinsonism, unspecified: Secondary | ICD-10-CM | POA: Diagnosis not present

## 2023-10-09 DIAGNOSIS — M6281 Muscle weakness (generalized): Secondary | ICD-10-CM | POA: Diagnosis not present

## 2023-10-09 DIAGNOSIS — R2689 Other abnormalities of gait and mobility: Secondary | ICD-10-CM | POA: Diagnosis not present

## 2023-10-14 NOTE — Progress Notes (Signed)
 Remote ICD transmission.

## 2023-10-14 NOTE — Addendum Note (Signed)
 Addended by: Lott Rouleau A on: 10/14/2023 11:55 AM   Modules accepted: Orders

## 2023-10-16 DIAGNOSIS — G20C Parkinsonism, unspecified: Secondary | ICD-10-CM | POA: Diagnosis not present

## 2023-10-16 DIAGNOSIS — R296 Repeated falls: Secondary | ICD-10-CM | POA: Diagnosis not present

## 2023-10-16 DIAGNOSIS — R2689 Other abnormalities of gait and mobility: Secondary | ICD-10-CM | POA: Diagnosis not present

## 2023-10-16 DIAGNOSIS — M6281 Muscle weakness (generalized): Secondary | ICD-10-CM | POA: Diagnosis not present

## 2023-10-17 DIAGNOSIS — I10 Essential (primary) hypertension: Secondary | ICD-10-CM | POA: Diagnosis not present

## 2023-10-17 DIAGNOSIS — Z79899 Other long term (current) drug therapy: Secondary | ICD-10-CM | POA: Diagnosis not present

## 2023-10-17 DIAGNOSIS — E871 Hypo-osmolality and hyponatremia: Secondary | ICD-10-CM | POA: Diagnosis not present

## 2023-10-17 LAB — LIPID PANEL
Cholesterol: 214 — AB (ref 0–200)
HDL: 66 (ref 35–70)
LDL Cholesterol: 137
LDl/HDL Ratio: 3.3
Triglycerides: 54 (ref 40–160)

## 2023-10-17 LAB — BASIC METABOLIC PANEL WITH GFR
BUN: 12 (ref 4–21)
CO2: 25 — AB (ref 13–22)
Chloride: 103 (ref 99–108)
Creatinine: 0.7 (ref 0.6–1.3)
Glucose: 88
Potassium: 4.6 meq/L (ref 3.5–5.1)
Sodium: 138 (ref 137–147)

## 2023-10-17 LAB — TSH: TSH: 4.44 (ref 0.41–5.90)

## 2023-10-17 LAB — COMPREHENSIVE METABOLIC PANEL WITH GFR
Calcium: 8.9 (ref 8.7–10.7)
eGFR: 89

## 2023-10-20 ENCOUNTER — Telehealth: Payer: Self-pay | Admitting: Neurology

## 2023-10-20 NOTE — Telephone Encounter (Signed)
 Dilantin  Levels - Cameron Potts called patients wife and she is having [facility] fax over Cameron Potts Dilantin  Levels. Please look out for that fax which shows patients level is 10.1 and dropped 4 1/2 in a month so she is concerned. Please call his wife to discuss.

## 2023-10-21 DIAGNOSIS — G20C Parkinsonism, unspecified: Secondary | ICD-10-CM | POA: Diagnosis not present

## 2023-10-21 DIAGNOSIS — R296 Repeated falls: Secondary | ICD-10-CM | POA: Diagnosis not present

## 2023-10-21 DIAGNOSIS — R2689 Other abnormalities of gait and mobility: Secondary | ICD-10-CM | POA: Diagnosis not present

## 2023-10-21 DIAGNOSIS — M6281 Muscle weakness (generalized): Secondary | ICD-10-CM | POA: Diagnosis not present

## 2023-10-21 NOTE — Telephone Encounter (Signed)
 Dilantin  level in March 2025 was within therapeutic range, no need to change dose.

## 2023-10-21 NOTE — Telephone Encounter (Signed)
 Cameron Potts (wife of pt) called and stated that the pts dilantin  level keeps decreasing and worrying her a lot. She is very upset that no one called her. I explained that the provider had an emergency and had to leave but that we have routed the call to the work in provider to advise. Pt wife was reassured that the work in provider has access to Dr. Elspeth Hals notes and plan of care. Pt wife voiced understanding to await callback

## 2023-10-21 NOTE — Telephone Encounter (Signed)
 Patient returned phone call,would like a call back.

## 2023-10-21 NOTE — Telephone Encounter (Signed)
 Dr. Omar Bibber,  As work in can you advise if changes need to be made to dilantin ? Thanks,  Cameron Potts  (Please send response to pod 2 calls)

## 2023-10-21 NOTE — Telephone Encounter (Signed)
 Lvm 1st attempt by hf 10/21/23

## 2023-10-21 NOTE — Telephone Encounter (Signed)
 Returned call to [place] pts wife, and stated labs were reviewed by the work in and stated, 'Dilantin  level in March 2025 was within therapeutic range, no need to change dose." Pt would like a personal call from Dr. Omar Bibber because she doesn't agree with the recommendations.

## 2023-10-21 NOTE — Telephone Encounter (Signed)
 Patient's wife called transferred to POD2

## 2023-10-21 NOTE — Telephone Encounter (Signed)
 Please get more specific information as you what patient's or his wife's concerns are? Is he having more seizures? He had an elevated phenytoin  level in early March and latest is in the therapeutic range.

## 2023-10-22 NOTE — Telephone Encounter (Signed)
 Dr. Omar Bibber,  The pt wife she stated that she was concerned that the work in provider would not be familiar with his range that Dr. Samara Crest has set for the dilantin  level and says that it keeps dropping. patients level is 10.1 and dropped 4 1/2 in a month so she is concerned. She was certain that a provider would make a dose change due to dropping.

## 2023-10-22 NOTE — Telephone Encounter (Signed)
 Tabitha from [facility] has called April, RN back

## 2023-10-22 NOTE — Telephone Encounter (Signed)
 Called the spouse and asked for sz frequency and the wife stated that the pt hasn't had one in a few months. Typically they stated the dilantin  was kept higher level than the 10.1  Placed the dilantin  report which includes the last few levels on Dr. Dail Drought desk   The recommendations need to be sent to [facility] and called to the pt wife as well

## 2023-10-22 NOTE — Telephone Encounter (Signed)
 Call to patient wife, no answer. Left message to return call.  Call to [facility], left message with tabitha mccall to call back

## 2023-10-22 NOTE — Telephone Encounter (Signed)
 As long as clinically he has not had any seizures, we should be okay.  His seizure risk will depend on his other medications as well including his Vimpat  and Keppra  levels.  So far, we do not have a pressing reason to change the Dilantin .  Please get in touch with his retirement facility to get more information about other medication levels such as Vimpat  and Keppra .  I would avoid any benzodiazepine medication in the context of seizure disorder on a day-to-day basis, even as needed because benzodiazepine use can alter his seizure risk.

## 2023-10-22 NOTE — Telephone Encounter (Signed)
 Please print me the latest levels for review. How often is he having Sz?

## 2023-10-22 NOTE — Telephone Encounter (Signed)
 Call from tabitha, gave verbal order for Keppra  and Vimpat  labs. She is aware Dr, Omar Bibber is reviewing chart. She is aware that Dr. Samara Crest is out of the office and that covering MD is aware of Dr. Samara Crest suggested dilantin  levels (15-18).  ( Dr Samara Crest note 09/11/23. Patient last seizure was 12/09/22 and same for last dose of ativan / last dose of xanax  was 2 months ago. Melba Spittle will fax vimpat  and keppra  lab results. Tabitha direct number [phone]

## 2023-10-23 ENCOUNTER — Telehealth: Payer: Self-pay

## 2023-10-23 DIAGNOSIS — G20C Parkinsonism, unspecified: Secondary | ICD-10-CM | POA: Diagnosis not present

## 2023-10-23 DIAGNOSIS — R296 Repeated falls: Secondary | ICD-10-CM | POA: Diagnosis not present

## 2023-10-23 DIAGNOSIS — G40001 Localization-related (focal) (partial) idiopathic epilepsy and epileptic syndromes with seizures of localized onset, not intractable, with status epilepticus: Secondary | ICD-10-CM | POA: Diagnosis not present

## 2023-10-23 DIAGNOSIS — R2689 Other abnormalities of gait and mobility: Secondary | ICD-10-CM | POA: Diagnosis not present

## 2023-10-23 DIAGNOSIS — M6281 Muscle weakness (generalized): Secondary | ICD-10-CM | POA: Diagnosis not present

## 2023-10-28 DIAGNOSIS — R296 Repeated falls: Secondary | ICD-10-CM | POA: Diagnosis not present

## 2023-10-28 DIAGNOSIS — M6281 Muscle weakness (generalized): Secondary | ICD-10-CM | POA: Diagnosis not present

## 2023-10-28 DIAGNOSIS — R2689 Other abnormalities of gait and mobility: Secondary | ICD-10-CM | POA: Diagnosis not present

## 2023-10-28 DIAGNOSIS — G20C Parkinsonism, unspecified: Secondary | ICD-10-CM | POA: Diagnosis not present

## 2023-10-29 ENCOUNTER — Telehealth: Payer: Self-pay | Admitting: Neurology

## 2023-10-29 ENCOUNTER — Other Ambulatory Visit: Payer: Self-pay | Admitting: Neurology

## 2023-10-29 MED ORDER — DILANTIN 100 MG PO CAPS: ORAL_CAPSULE | ORAL | 3 refills | Status: DC

## 2023-10-29 NOTE — Telephone Encounter (Signed)
 Pt wife Benigno Brakeman) is requesting to speak with Dr Samara Crest. Pt states that Pt Dilantin  level had dropped. Pt levels have to be a minium of 16 but Pt is 10 .Wife is very concern and would like to speak to Nurse or Provider as soon as possible .    Callback -Zuri Merriman [phone]

## 2023-10-29 NOTE — Telephone Encounter (Signed)
 Spoke with spouse and facility, will increase Dilantin  to 100 mg in the morning and 200 mg at night and will recheck a level next week.

## 2023-10-29 NOTE — Progress Notes (Signed)
 Patient last dilantin  level was 10.1 --> 14.4 -->22 Will increase Dilantin  to 100 mg AM and 200 mg PM.  Will recheck Dilantin  in a week.

## 2023-10-30 DIAGNOSIS — M6281 Muscle weakness (generalized): Secondary | ICD-10-CM | POA: Diagnosis not present

## 2023-10-30 DIAGNOSIS — G20C Parkinsonism, unspecified: Secondary | ICD-10-CM | POA: Diagnosis not present

## 2023-10-30 DIAGNOSIS — R2689 Other abnormalities of gait and mobility: Secondary | ICD-10-CM | POA: Diagnosis not present

## 2023-10-30 DIAGNOSIS — R296 Repeated falls: Secondary | ICD-10-CM | POA: Diagnosis not present

## 2023-11-04 DIAGNOSIS — G40919 Epilepsy, unspecified, intractable, without status epilepticus: Secondary | ICD-10-CM | POA: Diagnosis not present

## 2023-11-12 NOTE — Telephone Encounter (Signed)
 Dilantin  level received and faxed confirmation to [facility] at [phone]

## 2023-11-25 ENCOUNTER — Encounter

## 2023-11-28 ENCOUNTER — Ambulatory Visit

## 2023-11-28 DIAGNOSIS — I495 Sick sinus syndrome: Secondary | ICD-10-CM | POA: Diagnosis not present

## 2023-11-28 LAB — CUP PACEART REMOTE DEVICE CHECK
Battery Remaining Longevity: 5 mo
Battery Voltage: 2.82 V
Brady Statistic AP VP Percent: 0.36 %
Brady Statistic AP VS Percent: 99.23 %
Brady Statistic AS VP Percent: 0.24 %
Brady Statistic AS VS Percent: 0.17 %
Brady Statistic RA Percent Paced: 99.58 %
Brady Statistic RV Percent Paced: 0.6 %
Date Time Interrogation Session: 20250613001605
HighPow Impedance: 63 Ohm
Lead Channel Impedance Value: 361 Ohm
Lead Channel Impedance Value: 399 Ohm
Lead Channel Impedance Value: 475 Ohm
Lead Channel Pacing Threshold Amplitude: 0.75 V
Lead Channel Pacing Threshold Amplitude: 1.75 V
Lead Channel Pacing Threshold Pulse Width: 0.4 ms
Lead Channel Pacing Threshold Pulse Width: 0.4 ms
Lead Channel Sensing Intrinsic Amplitude: 1.5 mV
Lead Channel Sensing Intrinsic Amplitude: 1.5 mV
Lead Channel Sensing Intrinsic Amplitude: 4.25 mV
Lead Channel Sensing Intrinsic Amplitude: 4.25 mV
Lead Channel Setting Pacing Amplitude: 2 V
Lead Channel Setting Pacing Amplitude: 3.5 V
Lead Channel Setting Pacing Pulse Width: 0.4 ms
Lead Channel Setting Sensing Sensitivity: 0.3 mV
Zone Setting Status: 755011

## 2023-12-03 DIAGNOSIS — L814 Other melanin hyperpigmentation: Secondary | ICD-10-CM | POA: Diagnosis not present

## 2023-12-03 DIAGNOSIS — L570 Actinic keratosis: Secondary | ICD-10-CM | POA: Diagnosis not present

## 2023-12-03 DIAGNOSIS — L821 Other seborrheic keratosis: Secondary | ICD-10-CM | POA: Diagnosis not present

## 2023-12-08 DIAGNOSIS — N401 Enlarged prostate with lower urinary tract symptoms: Secondary | ICD-10-CM | POA: Diagnosis not present

## 2023-12-08 DIAGNOSIS — R350 Frequency of micturition: Secondary | ICD-10-CM | POA: Diagnosis not present

## 2023-12-08 DIAGNOSIS — R351 Nocturia: Secondary | ICD-10-CM | POA: Diagnosis not present

## 2023-12-10 ENCOUNTER — Ambulatory Visit: Payer: Self-pay | Admitting: Cardiovascular Disease

## 2023-12-11 ENCOUNTER — Telehealth: Payer: Self-pay | Admitting: Neurology

## 2023-12-11 NOTE — Telephone Encounter (Signed)
 [facility]/ [place] calling to verify patient is still followed and having VNS checked. Estimated battery life of 6 months, want to make sure patient is still followed.

## 2023-12-11 NOTE — Telephone Encounter (Signed)
 Spoke to megan vns rep and stated that the scheduled date is: With Neurology Rufino Falling, MD) 03/22/2024 at 3:45 PM

## 2023-12-29 ENCOUNTER — Encounter

## 2023-12-29 ENCOUNTER — Encounter: Payer: Self-pay | Admitting: Adult Health

## 2023-12-29 ENCOUNTER — Non-Acute Institutional Stay: Admitting: Adult Health

## 2023-12-29 VITALS — BP 112/78 | HR 60 | Temp 97.6°F | Ht 69.0 in | Wt 175.8 lb

## 2023-12-29 DIAGNOSIS — G40909 Epilepsy, unspecified, not intractable, without status epilepticus: Secondary | ICD-10-CM | POA: Diagnosis not present

## 2023-12-29 DIAGNOSIS — I951 Orthostatic hypotension: Secondary | ICD-10-CM | POA: Diagnosis not present

## 2023-12-29 DIAGNOSIS — L111 Transient acantholytic dermatosis [Grover]: Secondary | ICD-10-CM | POA: Diagnosis not present

## 2023-12-29 DIAGNOSIS — R269 Unspecified abnormalities of gait and mobility: Secondary | ICD-10-CM

## 2023-12-29 DIAGNOSIS — E871 Hypo-osmolality and hyponatremia: Secondary | ICD-10-CM | POA: Diagnosis not present

## 2023-12-29 DIAGNOSIS — I482 Chronic atrial fibrillation, unspecified: Secondary | ICD-10-CM | POA: Diagnosis not present

## 2023-12-29 DIAGNOSIS — G20C Parkinsonism, unspecified: Secondary | ICD-10-CM | POA: Diagnosis not present

## 2023-12-29 LAB — CUP PACEART REMOTE DEVICE CHECK
Battery Remaining Longevity: 3 mo
Battery Voltage: 2.83 V
Brady Statistic AP VP Percent: 0.1 %
Brady Statistic AP VS Percent: 99.76 %
Brady Statistic AS VP Percent: 0 %
Brady Statistic AS VS Percent: 0.13 %
Brady Statistic RA Percent Paced: 99.86 %
Brady Statistic RV Percent Paced: 0.1 %
Date Time Interrogation Session: 20250714052827
HighPow Impedance: 71 Ohm
Lead Channel Impedance Value: 361 Ohm
Lead Channel Impedance Value: 418 Ohm
Lead Channel Impedance Value: 513 Ohm
Lead Channel Pacing Threshold Amplitude: 0.75 V
Lead Channel Pacing Threshold Amplitude: 1.625 V
Lead Channel Pacing Threshold Pulse Width: 0.4 ms
Lead Channel Pacing Threshold Pulse Width: 0.4 ms
Lead Channel Sensing Intrinsic Amplitude: 1.625 mV
Lead Channel Sensing Intrinsic Amplitude: 1.625 mV
Lead Channel Sensing Intrinsic Amplitude: 4.25 mV
Lead Channel Sensing Intrinsic Amplitude: 4.25 mV
Lead Channel Setting Pacing Amplitude: 2 V
Lead Channel Setting Pacing Amplitude: 3.25 V
Lead Channel Setting Pacing Pulse Width: 0.4 ms
Lead Channel Setting Sensing Sensitivity: 0.3 mV
Zone Setting Status: 755011

## 2023-12-29 NOTE — Progress Notes (Signed)
 Location:  Medical illustrator of Service:  Clinic (12) Provider:  Tawni Darlean CARROLYN Charlanne Fredia LITTIE, MD  Patient Care Team: Charlanne Fredia LITTIE, MD as PCP - General (Internal Medicine) Francyne Headland, MD as PCP - Cardiology (Cardiology) Roz Anes, MD as Consulting Physician (Ophthalmology) Gregg Lek, MD as Consulting Physician (Neurology)  Extended Emergency Contact Information Primary Emergency Contact: Roma,[name] Address: [address]          [address] [place], [place] [address] [place] Home Phone: [phone] Mobile Phone: [phone] Relation: Spouse Secondary Emergency Contact: Fayette MADISON Ruth Home Phone: [phone] Mobile Phone: [phone] Relation: Son  Code Status:  DNR Goals of care: Advanced Directive information    12/29/2023    1:13 PM  Advanced Directives  Does Patient Have a Medical Advance Directive? Yes  Type of Estate agent of [place];Living will;Out of facility DNR (pink MOST or yellow form)  Does patient want to make changes to medical advance directive? No - Patient declined  Copy of Healthcare Power of Attorney in Chart? Yes - validated most recent copy scanned in chart (See row information)     Chief Complaint  Patient presents with   Medical Management of Chronic Issues    4 month follow up. Need for Shingles and Covid vaccine as well as an [name].    HPI:   PMH: afib off coumadin  due to falls, seizures, right temporal lobe resection, vagus nerve stimulator, SIADH, HTN, Grover's disease, Cardiac arrest with defibrillator, HLD, and BPH  No new seizures. Has VNS and on dilantin . Neuro manages the level which is checked q 2 months with his sodium due to hx of hyponatremia on sodium tablets.   Orthostatic hypotension: bp on the soft side today. No dizziness or new falls reported.    For atrial fibrillation, he is on flecainide  and reports no fast heart rates or palpitations. He is  no longer on Coumadin  and feels relieved from not needing frequent blood draws. (Due to falls)  He is on Flomax  for prostate issues and reports frequency. No difficulty urinating. No dysuria. Feels he urinates more when he takes sodium tablets.   He takes Miralax  for constipation and needs to remember to take prunes to manage bowel movements effectively.  He reports skin fragility, bruising easily, and has been using CeraVe mixed with Vaseline for Grover's disease, which has improved skin softness. He mentions hyperkeratotic lesions on his skin, which scab and fall off.  Has defibrillator   Gait issues uses walker.   Hx of PSA elevation, bph, followed by urology     Past Medical History:  Diagnosis Date   Abnormality of gait 12/21/2013   Atrial fibrillation (HCC)    Blind left eye    Carcinoma in situ of prostate    Cardiac arrest (HCC)    Glaucoma    Hypercholesteremia    Hyperlipidemia    Metabolic encephalopathy    Mild left ventricular systolic dysfunction    Orthostatic hypotension    Presence of other specified functional implants    PSA elevation    Right frontal lobe lesion    Seizures (HCC)    Tinea pedis    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grover)    Urinary urgency    Past Surgical History:  Procedure Laterality Date   CARDIAC DEFIBRILLATOR PLACEMENT     cataract surgery     COLONOSCOPY     CRANIOTOMY Right    right anterior temporal resection  defibrillator replaced  March 2016   IMPLANTATION VAGAL NERVE STIMULATOR  June 2016   NASAL SINUS SURGERY     TONSILLECTOMY     age 22   VASECTOMY  11    Allergies  Allergen Reactions   Depakote [Divalproex Sodium] Other (See Comments)    Arthralgias     Outpatient Encounter Medications as of 12/29/2023  Medication Sig   acetaminophen  (TYLENOL ) 325 MG tablet Take 650 mg by mouth every 6 (six) hours as needed for mild pain (pain score 1-3) or moderate pain (pain score 4-6).   ALPRAZolam  (XANAX ) 0.25 MG  tablet Take 1 tablet (0.25 mg total) by mouth 2 (two) times daily as needed for anxiety.   brimonidine -timolol  (COMBIGAN ) 0.2-0.5 % ophthalmic solution Place 1 drop into both eyes every 12 (twelve) hours.    calcium  carbonate (TUMS SMOOTHIES) 750 MG chewable tablet Chew 2 tablets by mouth 4 (four) times daily as needed for heartburn.   camphor-menthol (SARNA) lotion Apply 1 Application topically every 8 (eight) hours as needed for itching.   cetirizine  (ZYRTEC ) 10 MG tablet Take 10 mg by mouth as needed for allergies.   cholecalciferol (VITAMIN D ) 400 units TABS tablet Take 400 Units by mouth 2 (two) times daily.   clobetasol  cream (TEMOVATE ) 0.05 % Apply 1 application  topically 2 (two) times daily as needed (rash).   DILANTIN  100 MG ER capsule Take 1 capsule (100 mg total) by mouth every morning AND 2 capsules (200 mg total) at bedtime. Take 100 mg 2 capsules evening. As of 08/20/23).   econazole nitrate 1 % cream Apply 1 Application topically 2 (two) times daily as needed.   finasteride  (PROSCAR ) 5 MG tablet Take 5 mg by mouth every morning.   flecainide  (TAMBOCOR ) 50 MG tablet Take 50 mg by mouth 2 (two) times daily.   hydrALAZINE  (APRESOLINE ) 10 MG tablet Take 1 tablet (10 mg total) by mouth 2 (two) times daily as needed.   lacosamide  (VIMPAT ) 200 MG TABS tablet Take 1 tablet (200 mg total) by mouth 2 (two) times daily.   latanoprost  (XALATAN ) 0.005 % ophthalmic solution Place 1 drop into both eyes at bedtime.   Levetiracetam  750 MG TB24 Take 2,250 mg by mouth every evening.   LORazepam  (ATIVAN ) 2 MG/ML concentrated solution Take 0.5 mLs (1 mg total) by mouth as needed for seizure or anxiety.   polyethylene glycol (MIRALAX  / GLYCOLAX ) 17 g packet Take 17 g by mouth as needed.   polyethylene glycol (MIRALAX  / GLYCOLAX ) packet Take 17 g by mouth every other day.   sodium chloride  1 g tablet Take 2 tablets (2 g total) by mouth 3 (three) times daily.   talc powder Apply topically 2 (two) times  daily as needed.   tamsulosin  (FLOMAX ) 0.4 MG CAPS capsule Take 0.4 mg by mouth in the morning.   triamcinolone cream (KENALOG) 0.1 % Apply 1 Application topically 2 (two) times daily as needed.   No facility-administered encounter medications on file as of 12/29/2023.    Review of Systems  Constitutional:  Negative for activity change, appetite change, chills, diaphoresis, fatigue, fever and unexpected weight change.  Respiratory:  Negative for cough, shortness of breath, wheezing and stridor.   Cardiovascular:  Positive for leg swelling. Negative for chest pain and palpitations.  Gastrointestinal:  Negative for abdominal distention, abdominal pain, constipation and diarrhea.  Genitourinary:  Positive for frequency. Negative for difficulty urinating and dysuria.  Musculoskeletal:  Positive for gait problem. Negative for arthralgias, back pain,  joint swelling and myalgias.  Neurological:  Positive for weakness. Negative for dizziness, seizures, syncope, facial asymmetry, speech difficulty and headaches.  Hematological:  Negative for adenopathy. Does not bruise/bleed easily.  Psychiatric/Behavioral:  Negative for agitation, behavioral problems and confusion.     Immunization History  Administered Date(s) Administered   Fluad Quad(high Dose 65+) 03/22/2022, 04/08/2023   Fluad Trivalent(High Dose 65+) 04/08/2023   Influenza, High Dose Seasonal PF 04/07/2020   Influenza, Seasonal, Injecte, Preservative Fre 02/22/2010   Influenza,inj,Quad PF,6+ Mos 04/07/2018, 04/15/2019   Influenza-Unspecified 03/17/2013, 02/24/2014, 02/16/2015, 03/21/2016, 03/17/2017, 04/07/2018, 03/21/2021   Moderna SARS-COV2 Booster Vaccination 01/11/2021, 03/28/2021, 04/08/2023   Moderna Sars-Covid-2 Vaccination 06/28/2019, 07/27/2019, 05/01/2020, 04/22/2022, 10/09/2022   Pneumococcal Conjugate-13 07/05/2013   Pneumococcal Polysaccharide-23 06/17/2006   Td 06/18/2007   Tdap 12/07/2015, 09/17/2020   Zoster  Recombinant(Shingrix) 12/04/2016   Zoster, Live 10/06/2008   Pertinent  Health Maintenance Due  Topic Date Due   INFLUENZA VACCINE  01/16/2024      09/17/2022    3:12 PM 10/21/2022    1:58 PM 03/03/2023    9:30 AM 03/24/2023    2:10 PM 08/25/2023    2:26 PM  Fall Risk  Falls in the past year? 1 1 0 1 1  Was there an injury with Fall? 1 1 0 1 0  Fall Risk Category Calculator 3 3 0 2 1  Patient at Risk for Falls Due to History of fall(s);Impaired mobility;Impaired balance/gait Impaired balance/gait;Impaired mobility;History of fall(s) No Fall Risks History of fall(s);Impaired balance/gait;Impaired mobility Impaired balance/gait;Impaired mobility  Fall risk Follow up Falls evaluation completed Falls evaluation completed   Falls evaluation completed   Functional Status Survey:    Vitals:   12/29/23 1313  BP: 112/78  Pulse: 60  Temp: 97.6 F (36.4 C)  SpO2: 97%  Weight: 175 lb 12.8 oz (79.7 kg)  Height: 5' 9 (1.753 m)    Body mass index is 25.96 kg/m. Wt Readings from Last 3 Encounters:  12/29/23 175 lb 12.8 oz (79.7 kg)  09/04/23 170 lb (77.1 kg)  08/22/23 170 lb 6.4 oz (77.3 kg)    Physical Exam Vitals and nursing note reviewed.  Constitutional:      General: He is not in acute distress.    Appearance: He is not diaphoretic.  HENT:     Head: Normocephalic and atraumatic.     Right Ear: Tympanic membrane normal.     Left Ear: Tympanic membrane normal.     Nose: Nose normal.     Mouth/Throat:     Mouth: Mucous membranes are moist.     Pharynx: Oropharynx is clear.  Eyes:     Conjunctiva/sclera: Conjunctivae normal.     Pupils: Pupils are equal, round, and reactive to light.  Neck:     Thyroid : No thyromegaly.     Vascular: No JVD.     Trachea: No tracheal deviation.  Cardiovascular:     Rate and Rhythm: Normal rate. Rhythm irregular.     Heart sounds: No murmur heard. Pulmonary:     Effort: Pulmonary effort is normal. No respiratory distress.     Breath sounds:  Normal breath sounds. No wheezing.  Abdominal:     General: Bowel sounds are normal. There is no distension.     Palpations: Abdomen is soft.     Tenderness: There is no abdominal tenderness.  Musculoskeletal:     Comments: BLE edema +1  Lymphadenopathy:     Cervical: No cervical adenopathy.  Skin:  General: Skin is warm and dry.     Comments: Xerosis   Neurological:     General: No focal deficit present.     Mental Status: He is alert. Mental status is at baseline.     Cranial Nerves: No cranial nerve deficit.  Psychiatric:        Mood and Affect: Mood normal.     Labs reviewed: Recent Labs    08/05/23 1457 08/20/23 1501 09/04/23 1630 10/17/23 0000  NA 134* 139 138 138  K 4.4 4.2 4.1 4.6  CL 100 101 101 103  CO2 24 22 23  25*  GLUCOSE 101* 90 94  --   BUN 13 16 13 12   CREATININE 0.80 0.75* 0.81 0.7  CALCIUM  9.0 9.3 9.0 8.9   Recent Labs    03/04/23 0000  AST 25  ALT 18  ALKPHOS 148*  ALBUMIN 4.2   Recent Labs    03/04/23 0000 06/21/23 0000 08/05/23 1457  WBC 6.1 4.8 6.4  HGB 12.3* 12.3* 11.6*  HCT 35* 34* 33.9*  MCV  --   --  94.2  PLT 226 237 209   Lab Results  Component Value Date   TSH 4.44 10/17/2023   No results found for: HGBA1C Lab Results  Component Value Date   CHOL 214 (A) 10/17/2023   HDL 66 10/17/2023   LDLCALC 137 10/17/2023   TRIG 54 10/17/2023    Significant Diagnostic Results in last 30 days:  No results found.   Assessment/Plan  Seizure disorder Followed by neurology Continue current medication No new issues.   Atrial Fibrillation Stable on Flecainide . No recent palpitations reported. -Continue Flecainide  as prescribed. Not on coumadin  due to falls.   Benign Prostatic Hyperplasia Currently on Flomax . -Continue Flomax  as prescribed. F/u with urology with worsening issues.   Constipation Managed with Miralax  and prunes. -Continue current regimen and ensure regular intake of  prunes.  Hyponatremia  -Continue sodium tablets as prescribed.  Skin Condition (Grover's disease) Reports improvement in skin texture with use of CeraVe and Vaseline.   Hx of orthostatic hypotension Rise slowly  General Health Maintenance / Followup Plans -Schedule follow-up appointment in four months.    Labs/tests ordered:  F/U 4 months with me in 4 months Has BMP Dilantin  level q 2 months   Total time :  time greater than 50% of total time spent doing pt counseling and coordination of care

## 2023-12-31 DIAGNOSIS — L821 Other seborrheic keratosis: Secondary | ICD-10-CM | POA: Diagnosis not present

## 2023-12-31 DIAGNOSIS — L814 Other melanin hyperpigmentation: Secondary | ICD-10-CM | POA: Diagnosis not present

## 2023-12-31 DIAGNOSIS — L570 Actinic keratosis: Secondary | ICD-10-CM | POA: Diagnosis not present

## 2024-01-13 ENCOUNTER — Telehealth: Payer: Self-pay | Admitting: Orthopedic Surgery

## 2024-01-13 DIAGNOSIS — R410 Disorientation, unspecified: Secondary | ICD-10-CM | POA: Diagnosis not present

## 2024-01-13 MED ORDER — PHENYTOIN SODIUM EXTENDED 100 MG PO CAPS: 100.0000 mg | ORAL_CAPSULE | Freq: Every morning | ORAL | 0 refills | Status: DC

## 2024-01-13 NOTE — Telephone Encounter (Signed)
 Na+ 133, dilantin  level 7.7. Plan to increase dilantin  to 200 mg po BID x 2 days, then continue regular scheduled dilantin . Continue current sodium tablets as ordered.

## 2024-01-14 ENCOUNTER — Telehealth: Payer: Self-pay | Admitting: Neurology

## 2024-01-14 NOTE — Telephone Encounter (Signed)
 Call to Lindsay, RN. Facilty MD added 100 mg dilantin   for 2 days ( 200 mg BID) and then will go back to normal 100 mg in the am and 200 mg in the pm. Tabitha states overall the patient is doing well, no seizures but family has mentioned some confusion. Advised could be age related will inform Dr. Gregg.

## 2024-01-14 NOTE — Telephone Encounter (Signed)
 Tabitha RN, at [facility]  levels done yesterday his was 7.7 please call

## 2024-01-15 NOTE — Progress Notes (Signed)
 Remote ICD transmission.

## 2024-01-23 ENCOUNTER — Other Ambulatory Visit: Payer: Self-pay | Admitting: Adult Health

## 2024-01-23 DIAGNOSIS — G40919 Epilepsy, unspecified, intractable, without status epilepticus: Secondary | ICD-10-CM

## 2024-01-23 MED ORDER — LACOSAMIDE 200 MG PO TABS: 200.0000 mg | ORAL_TABLET | Freq: Two times a day (BID) | ORAL | 3 refills | Status: DC

## 2024-01-29 ENCOUNTER — Ambulatory Visit

## 2024-01-29 DIAGNOSIS — I495 Sick sinus syndrome: Secondary | ICD-10-CM

## 2024-01-29 LAB — CUP PACEART REMOTE DEVICE CHECK
Battery Remaining Longevity: 4 mo
Battery Voltage: 2.83 V
Brady Statistic AP VP Percent: 0.17 %
Brady Statistic AP VS Percent: 98.86 %
Brady Statistic AS VP Percent: 0.2 %
Brady Statistic AS VS Percent: 0.77 %
Brady Statistic RA Percent Paced: 98.91 %
Brady Statistic RV Percent Paced: 0.36 %
Date Time Interrogation Session: 20250814044225
HighPow Impedance: 72 Ohm
Lead Channel Impedance Value: 399 Ohm
Lead Channel Impedance Value: 399 Ohm
Lead Channel Impedance Value: 513 Ohm
Lead Channel Pacing Threshold Amplitude: 0.625 V
Lead Channel Pacing Threshold Amplitude: 1.75 V
Lead Channel Pacing Threshold Pulse Width: 0.4 ms
Lead Channel Pacing Threshold Pulse Width: 0.4 ms
Lead Channel Sensing Intrinsic Amplitude: 1.375 mV
Lead Channel Sensing Intrinsic Amplitude: 1.375 mV
Lead Channel Sensing Intrinsic Amplitude: 4.25 mV
Lead Channel Sensing Intrinsic Amplitude: 4.25 mV
Lead Channel Setting Pacing Amplitude: 1.75 V
Lead Channel Setting Pacing Amplitude: 3.5 V
Lead Channel Setting Pacing Pulse Width: 0.4 ms
Lead Channel Setting Sensing Sensitivity: 0.3 mV
Zone Setting Status: 755011

## 2024-02-08 ENCOUNTER — Ambulatory Visit: Payer: Self-pay | Admitting: Cardiovascular Disease

## 2024-02-14 ENCOUNTER — Inpatient Hospital Stay (HOSPITAL_COMMUNITY)

## 2024-02-14 ENCOUNTER — Emergency Department (HOSPITAL_COMMUNITY)

## 2024-02-14 ENCOUNTER — Encounter (HOSPITAL_COMMUNITY): Payer: Self-pay

## 2024-02-14 ENCOUNTER — Inpatient Hospital Stay (HOSPITAL_COMMUNITY)
Admission: EM | Admit: 2024-02-14 | Discharge: 2024-02-16 | DRG: 065 | Disposition: A | Discharge status: Nursing Home (Includes ICF) | Source: Skilled Nursing Facility | Attending: Family Medicine | Admitting: Family Medicine

## 2024-02-14 ENCOUNTER — Other Ambulatory Visit: Payer: Self-pay

## 2024-02-14 DIAGNOSIS — G629 Polyneuropathy, unspecified: Secondary | ICD-10-CM | POA: Diagnosis present

## 2024-02-14 DIAGNOSIS — Z8674 Personal history of sudden cardiac arrest: Secondary | ICD-10-CM | POA: Diagnosis not present

## 2024-02-14 DIAGNOSIS — I6522 Occlusion and stenosis of left carotid artery: Secondary | ICD-10-CM | POA: Diagnosis not present

## 2024-02-14 DIAGNOSIS — R4701 Aphasia: Secondary | ICD-10-CM | POA: Diagnosis present

## 2024-02-14 DIAGNOSIS — H5340 Unspecified visual field defects: Secondary | ICD-10-CM | POA: Diagnosis present

## 2024-02-14 DIAGNOSIS — Z888 Allergy status to other drugs, medicaments and biological substances status: Secondary | ICD-10-CM | POA: Diagnosis not present

## 2024-02-14 DIAGNOSIS — I951 Orthostatic hypotension: Secondary | ICD-10-CM | POA: Diagnosis not present

## 2024-02-14 DIAGNOSIS — Z66 Do not resuscitate: Secondary | ICD-10-CM | POA: Diagnosis present

## 2024-02-14 DIAGNOSIS — I10 Essential (primary) hypertension: Secondary | ICD-10-CM | POA: Diagnosis present

## 2024-02-14 DIAGNOSIS — Z86002 Personal history of in-situ neoplasm of other and unspecified genital organs: Secondary | ICD-10-CM

## 2024-02-14 DIAGNOSIS — R29702 NIHSS score 2: Secondary | ICD-10-CM | POA: Diagnosis present

## 2024-02-14 DIAGNOSIS — G20A1 Parkinson's disease without dyskinesia, without mention of fluctuations: Secondary | ICD-10-CM | POA: Diagnosis present

## 2024-02-14 DIAGNOSIS — Z9689 Presence of other specified functional implants: Secondary | ICD-10-CM | POA: Diagnosis present

## 2024-02-14 DIAGNOSIS — R296 Repeated falls: Secondary | ICD-10-CM | POA: Diagnosis present

## 2024-02-14 DIAGNOSIS — G40909 Epilepsy, unspecified, not intractable, without status epilepticus: Secondary | ICD-10-CM | POA: Diagnosis not present

## 2024-02-14 DIAGNOSIS — E785 Hyperlipidemia, unspecified: Secondary | ICD-10-CM | POA: Diagnosis not present

## 2024-02-14 DIAGNOSIS — E7800 Pure hypercholesterolemia, unspecified: Secondary | ICD-10-CM | POA: Diagnosis present

## 2024-02-14 DIAGNOSIS — H5462 Unqualified visual loss, left eye, normal vision right eye: Secondary | ICD-10-CM | POA: Diagnosis present

## 2024-02-14 DIAGNOSIS — G9389 Other specified disorders of brain: Secondary | ICD-10-CM | POA: Diagnosis not present

## 2024-02-14 DIAGNOSIS — R9082 White matter disease, unspecified: Secondary | ICD-10-CM | POA: Diagnosis not present

## 2024-02-14 DIAGNOSIS — I6501 Occlusion and stenosis of right vertebral artery: Secondary | ICD-10-CM | POA: Diagnosis not present

## 2024-02-14 DIAGNOSIS — Z79899 Other long term (current) drug therapy: Secondary | ICD-10-CM

## 2024-02-14 DIAGNOSIS — Z95810 Presence of automatic (implantable) cardiac defibrillator: Secondary | ICD-10-CM

## 2024-02-14 DIAGNOSIS — H409 Unspecified glaucoma: Secondary | ICD-10-CM | POA: Diagnosis present

## 2024-02-14 DIAGNOSIS — Z950 Presence of cardiac pacemaker: Secondary | ICD-10-CM | POA: Diagnosis not present

## 2024-02-14 DIAGNOSIS — I639 Cerebral infarction, unspecified: Principal | ICD-10-CM | POA: Diagnosis present

## 2024-02-14 DIAGNOSIS — I482 Chronic atrial fibrillation, unspecified: Secondary | ICD-10-CM | POA: Diagnosis present

## 2024-02-14 DIAGNOSIS — H539 Unspecified visual disturbance: Secondary | ICD-10-CM | POA: Diagnosis not present

## 2024-02-14 DIAGNOSIS — I63531 Cerebral infarction due to unspecified occlusion or stenosis of right posterior cerebral artery: Secondary | ICD-10-CM | POA: Diagnosis not present

## 2024-02-14 DIAGNOSIS — G40209 Localization-related (focal) (partial) symptomatic epilepsy and epileptic syndromes with complex partial seizures, not intractable, without status epilepticus: Secondary | ICD-10-CM | POA: Diagnosis not present

## 2024-02-14 DIAGNOSIS — G8194 Hemiplegia, unspecified affecting left nondominant side: Secondary | ICD-10-CM | POA: Diagnosis present

## 2024-02-14 DIAGNOSIS — G20C Parkinsonism, unspecified: Secondary | ICD-10-CM | POA: Diagnosis present

## 2024-02-14 DIAGNOSIS — F4322 Adjustment disorder with anxiety: Secondary | ICD-10-CM | POA: Diagnosis present

## 2024-02-14 DIAGNOSIS — N401 Enlarged prostate with lower urinary tract symptoms: Secondary | ICD-10-CM | POA: Diagnosis present

## 2024-02-14 DIAGNOSIS — E041 Nontoxic single thyroid nodule: Secondary | ICD-10-CM | POA: Diagnosis not present

## 2024-02-14 DIAGNOSIS — R29818 Other symptoms and signs involving the nervous system: Secondary | ICD-10-CM | POA: Diagnosis not present

## 2024-02-14 DIAGNOSIS — R29810 Facial weakness: Secondary | ICD-10-CM | POA: Diagnosis present

## 2024-02-14 DIAGNOSIS — R471 Dysarthria and anarthria: Secondary | ICD-10-CM | POA: Diagnosis present

## 2024-02-14 DIAGNOSIS — I4891 Unspecified atrial fibrillation: Secondary | ICD-10-CM

## 2024-02-14 DIAGNOSIS — T426X6A Underdosing of other antiepileptic and sedative-hypnotic drugs, initial encounter: Secondary | ICD-10-CM | POA: Diagnosis not present

## 2024-02-14 DIAGNOSIS — R269 Unspecified abnormalities of gait and mobility: Secondary | ICD-10-CM | POA: Diagnosis not present

## 2024-02-14 DIAGNOSIS — I69398 Other sequelae of cerebral infarction: Secondary | ICD-10-CM

## 2024-02-14 DIAGNOSIS — I6389 Other cerebral infarction: Secondary | ICD-10-CM | POA: Diagnosis not present

## 2024-02-14 DIAGNOSIS — I6523 Occlusion and stenosis of bilateral carotid arteries: Secondary | ICD-10-CM | POA: Diagnosis not present

## 2024-02-14 LAB — RAPID URINE DRUG SCREEN, HOSP PERFORMED
Amphetamines: NOT DETECTED
Barbiturates: NOT DETECTED
Benzodiazepines: NOT DETECTED
Cocaine: NOT DETECTED
Opiates: NOT DETECTED
Tetrahydrocannabinol: NOT DETECTED

## 2024-02-14 LAB — DIFFERENTIAL
Abs Immature Granulocytes: 0.02 K/uL (ref 0.00–0.07)
Basophils Absolute: 0.1 K/uL (ref 0.0–0.1)
Basophils Relative: 2 %
Eosinophils Absolute: 0.5 K/uL (ref 0.0–0.5)
Eosinophils Relative: 8 %
Immature Granulocytes: 0 %
Lymphocytes Relative: 27 %
Lymphs Abs: 1.6 K/uL (ref 0.7–4.0)
Monocytes Absolute: 0.6 K/uL (ref 0.1–1.0)
Monocytes Relative: 10 %
Neutro Abs: 3.2 K/uL (ref 1.7–7.7)
Neutrophils Relative %: 53 %

## 2024-02-14 LAB — COMPREHENSIVE METABOLIC PANEL WITH GFR
ALT: 18 U/L (ref 0–44)
AST: 23 U/L (ref 15–41)
Albumin: 4 g/dL (ref 3.5–5.0)
Alkaline Phosphatase: 126 U/L (ref 38–126)
Anion gap: 10 (ref 5–15)
BUN: 13 mg/dL (ref 8–23)
CO2: 20 mmol/L — ABNORMAL LOW (ref 22–32)
Calcium: 8.9 mg/dL (ref 8.9–10.3)
Chloride: 105 mmol/L (ref 98–111)
Creatinine, Ser: 0.74 mg/dL (ref 0.61–1.24)
GFR, Estimated: >60 mL/min (ref >60)
Glucose, Bld: 95 mg/dL (ref 70–99)
Potassium: 4.2 mmol/L (ref 3.5–5.1)
Sodium: 135 mmol/L (ref 135–145)
Total Bilirubin: 0.6 mg/dL (ref 0.0–1.2)
Total Protein: 7.5 g/dL (ref 6.5–8.1)

## 2024-02-14 LAB — CBC
HCT: 36.7 % — ABNORMAL LOW (ref 39.0–52.0)
Hemoglobin: 12.9 g/dL — ABNORMAL LOW (ref 13.0–17.0)
MCH: 32.8 pg (ref 26.0–34.0)
MCHC: 35.1 g/dL (ref 30.0–36.0)
MCV: 93.4 fL (ref 80.0–100.0)
Platelets: 209 K/uL (ref 150–400)
RBC: 3.93 MIL/uL — ABNORMAL LOW (ref 4.22–5.81)
RDW: 13.1 % (ref 11.5–15.5)
WBC: 6.1 K/uL (ref 4.0–10.5)
nRBC: 0 % (ref 0.0–0.2)

## 2024-02-14 LAB — I-STAT CHEM 8, ED
BUN: 14 mg/dL (ref 8–23)
Calcium, Ion: 1.13 mmol/L — ABNORMAL LOW (ref 1.15–1.40)
Chloride: 103 mmol/L (ref 98–111)
Creatinine, Ser: 0.7 mg/dL (ref 0.61–1.24)
Glucose, Bld: 97 mg/dL (ref 70–99)
HCT: 37 % — ABNORMAL LOW (ref 39.0–52.0)
Hemoglobin: 12.6 g/dL — ABNORMAL LOW (ref 13.0–17.0)
Potassium: 4.3 mmol/L (ref 3.5–5.1)
Sodium: 136 mmol/L (ref 135–145)
TCO2: 23 mmol/L (ref 22–32)

## 2024-02-14 LAB — URINALYSIS, ROUTINE W REFLEX MICROSCOPIC
Bilirubin Urine: NEGATIVE
Glucose, UA: NEGATIVE mg/dL
Hgb urine dipstick: NEGATIVE
Ketones, ur: NEGATIVE mg/dL
Leukocytes,Ua: NEGATIVE
Nitrite: NEGATIVE
Protein, ur: NEGATIVE mg/dL
Specific Gravity, Urine: 1.011 (ref 1.005–1.030)
pH: 7 (ref 5.0–8.0)

## 2024-02-14 LAB — PROTIME-INR
INR: 1 (ref 0.8–1.2)
Prothrombin Time: 13.9 s (ref 11.4–15.2)

## 2024-02-14 LAB — APTT: aPTT: 27 s (ref 24–36)

## 2024-02-14 LAB — HEMOGLOBIN A1C
Hgb A1c MFr Bld: 5.1 % (ref 4.8–5.6)
Mean Plasma Glucose: 99.67 mg/dL

## 2024-02-14 LAB — ETHANOL: Alcohol, Ethyl (B): <15 mg/dL (ref <15)

## 2024-02-14 LAB — MAGNESIUM: Magnesium: 2 mg/dL (ref 1.7–2.4)

## 2024-02-14 LAB — CBG MONITORING, ED: Glucose-Capillary: 90 mg/dL (ref 70–99)

## 2024-02-14 LAB — VALPROIC ACID LEVEL: Valproic Acid Lvl: <10 ug/mL — ABNORMAL LOW (ref 50–100)

## 2024-02-14 MED ORDER — LEVETIRACETAM ER 750 MG PO TB24
2250.0000 mg | ORAL_TABLET | Freq: Every evening | ORAL | Status: DC
  Filled 2024-02-14: qty 3

## 2024-02-14 MED ORDER — FLECAINIDE ACETATE 50 MG PO TABS
50.0000 mg | ORAL_TABLET | Freq: Two times a day (BID) | ORAL | Status: DC
  Administered 2024-02-14 – 2024-02-16 (×5): 50 mg via ORAL
  Filled 2024-02-14 (×5): qty 1

## 2024-02-14 MED ORDER — SODIUM CHLORIDE 1 G PO TABS
2.0000 g | ORAL_TABLET | Freq: Three times a day (TID) | ORAL | Status: DC
  Administered 2024-02-14: 2 g via ORAL
  Administered 2024-02-15 (×2): 1 g via ORAL
  Administered 2024-02-15 – 2024-02-16 (×2): 2 g via ORAL
  Filled 2024-02-14 (×7): qty 2

## 2024-02-14 MED ORDER — ATORVASTATIN CALCIUM 10 MG PO TABS
20.0000 mg | ORAL_TABLET | Freq: Every day | ORAL | Status: DC
  Administered 2024-02-14 – 2024-02-16 (×3): 20 mg via ORAL
  Filled 2024-02-14 (×3): qty 2

## 2024-02-14 MED ORDER — SENNOSIDES-DOCUSATE SODIUM 8.6-50 MG PO TABS: 1.0000 | ORAL_TABLET | Freq: Every evening | ORAL | Status: DC | PRN

## 2024-02-14 MED ORDER — FINASTERIDE 5 MG PO TABS
5.0000 mg | ORAL_TABLET | Freq: Every morning | ORAL | Status: DC
  Administered 2024-02-14 – 2024-02-16 (×3): 5 mg via ORAL
  Filled 2024-02-14 (×3): qty 1

## 2024-02-14 MED ORDER — PHENYTOIN SODIUM EXTENDED 100 MG PO CAPS: 100.0000 mg | ORAL_CAPSULE | ORAL | Status: DC

## 2024-02-14 MED ORDER — PHENYTOIN SODIUM EXTENDED 100 MG PO CAPS
200.0000 mg | ORAL_CAPSULE | Freq: Every day | ORAL | Status: DC
  Administered 2024-02-14: 200 mg via ORAL
  Filled 2024-02-14: qty 2

## 2024-02-14 MED ORDER — LORAZEPAM 2 MG/ML PO CONC: 1.0000 mg | Freq: Two times a day (BID) | ORAL | Status: DC | PRN

## 2024-02-14 MED ORDER — IOHEXOL 350 MG/ML SOLN
75.0000 mL | Freq: Once | INTRAVENOUS | Status: AC | PRN
  Administered 2024-02-14: 75 mL via INTRAVENOUS

## 2024-02-14 MED ORDER — SODIUM CHLORIDE 0.9 % IV SOLN: INTRAVENOUS | Status: AC

## 2024-02-14 MED ORDER — PHENYTOIN SODIUM EXTENDED 100 MG PO CAPS: 200.0000 mg | ORAL_CAPSULE | Freq: Every day | ORAL | Status: DC

## 2024-02-14 MED ORDER — ACETAMINOPHEN 325 MG PO TABS
650.0000 mg | ORAL_TABLET | ORAL | Status: DC | PRN
Start: 2024-02-14 — End: 2024-02-16

## 2024-02-14 MED ORDER — ASPIRIN 325 MG PO TABS
325.0000 mg | ORAL_TABLET | Freq: Once | ORAL | Status: AC
  Administered 2024-02-14: 325 mg via ORAL
  Filled 2024-02-14: qty 1

## 2024-02-14 MED ORDER — STROKE: EARLY STAGES OF RECOVERY BOOK
Freq: Once | Status: AC
  Filled 2024-02-14: qty 1

## 2024-02-14 MED ORDER — LACOSAMIDE 50 MG PO TABS
200.0000 mg | ORAL_TABLET | Freq: Two times a day (BID) | ORAL | Status: DC
  Administered 2024-02-14 – 2024-02-16 (×4): 200 mg via ORAL
  Filled 2024-02-14 (×5): qty 4

## 2024-02-14 MED ORDER — CLOPIDOGREL BISULFATE 75 MG PO TABS
75.0000 mg | ORAL_TABLET | Freq: Every day | ORAL | Status: DC
  Administered 2024-02-14 – 2024-02-16 (×3): 75 mg via ORAL
  Filled 2024-02-14 (×3): qty 1

## 2024-02-14 MED ORDER — PHENYTOIN SODIUM EXTENDED 100 MG PO CAPS
100.0000 mg | ORAL_CAPSULE | ORAL | Status: DC
  Administered 2024-02-14 – 2024-02-15 (×2): 100 mg via ORAL
  Filled 2024-02-14 (×2): qty 1

## 2024-02-14 MED ORDER — ACETAMINOPHEN 160 MG/5ML PO SOLN: 650.0000 mg | ORAL | Status: DC | PRN

## 2024-02-14 MED ORDER — ALPRAZOLAM 0.25 MG PO TABS: 0.2500 mg | ORAL_TABLET | Freq: Two times a day (BID) | ORAL | Status: DC | PRN

## 2024-02-14 MED ORDER — TAMSULOSIN HCL 0.4 MG PO CAPS
0.4000 mg | ORAL_CAPSULE | Freq: Every morning | ORAL | Status: DC
  Administered 2024-02-15 – 2024-02-16 (×2): 0.4 mg via ORAL
  Filled 2024-02-14 (×2): qty 1

## 2024-02-14 MED ORDER — ACETAMINOPHEN 650 MG RE SUPP: 650.0000 mg | RECTAL | Status: DC | PRN

## 2024-02-14 MED ORDER — LEVETIRACETAM (KEPPRA) 500 MG/5 ML ADULT IV PUSH
1500.0000 mg | Freq: Two times a day (BID) | INTRAVENOUS | Status: DC
  Administered 2024-02-14 – 2024-02-15 (×2): 1500 mg via INTRAVENOUS
  Filled 2024-02-14 (×2): qty 15

## 2024-02-14 MED ORDER — ASPIRIN 81 MG PO TBEC
81.0000 mg | DELAYED_RELEASE_TABLET | Freq: Every day | ORAL | Status: DC
  Administered 2024-02-15 – 2024-02-16 (×2): 81 mg via ORAL
  Filled 2024-02-14 (×2): qty 1

## 2024-02-14 NOTE — ED Notes (Signed)
 CCMD called.

## 2024-02-14 NOTE — Progress Notes (Signed)
 LTM EEG hooked up and running - no initial skin breakdown - push button tested - [facility] monitoring.

## 2024-02-14 NOTE — ED Triage Notes (Signed)
 Patient from [facility] for eval of vision changes in L eye and more shuffling gait since yesterday morning after taking his normal meds. On assessment patient states at baseline he has no peripheral vision in L eye and walks with a walker and vision is no worse but has a dull feeling around the L eye.

## 2024-02-14 NOTE — ED Notes (Signed)
 Pt family states pt normally takes his seizure meds at 8am and has not had them this morning. States that he is very sensitive to not taking them in a timely fashion and that if he starts seizing the seizures can last awhile.   EDP and Medic notified.

## 2024-02-14 NOTE — ED Provider Triage Note (Signed)
 Emergency Medicine Provider Triage Evaluation Note  Cameron Potts , a 88 y.o. male  was evaluated in triage.  Pt complains of dull feeling around L eye. Has chronic vision changes -- no change today. Denies headache. States he feels more weak and off balance but denies new limb weakness.  Review of Systems  Positive: weakness Negative: Fever   Physical Exam  BP (!) 193/86 (BP Location: Right Arm)   Pulse 64   Temp 97.9 F (36.6 C)   Resp 19   SpO2 99%  Gen:   Awake, no distress   Resp:  Normal effort  MSK:   Moves extremities without difficulty  Other:  Strength symmetric, smile symmetric, EOMI  Medical Decision Making  Medically screening exam initiated at 10:24 AM.  Appropriate orders placed.  Cameron Potts was informed that the remainder of the evaluation will be completed by another provider, this initial triage assessment does not replace that evaluation, and the importance of remaining in the ED until their evaluation is complete.  Labs, CT head   Neldon Hamp RAMAN, [place] 02/14/24 1025

## 2024-02-14 NOTE — H&P (Signed)
 History and Physical    Cameron Potts FMW:[id number] DOB: 1936-02-28 DOA: 02/14/2024  PCP: Charlanne Fredia CROME, MD   Patient coming from: [facility]  I have personally briefly reviewed patient's old medical records in [facility]  Chief Complaint:  Worsening Left sided peripheral Vision  HPI: Cameron Potts is a 88 y.o. 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  Patient was noted to have worsening peripheral visual field loss on the left side in the morning today at nursing home.  Patient describes to the nurse that something is wrong with his vision which got worse.  EMS was called and patient was brought in the ED.  ED Course: Patient was hemodynamically stable except hypertension. Temp 97.7, HR 62, RR 16, BP 183/75, SpO2 100% on room air Labs include sodium 136, potassium 4.3, chloride 103, bicarb 20, glucose 97, BUN 14, creatinine 0.70, calcium  8.9, anion gap 10, magnesium 2.0, alkaline phosphatase 126, albumin 4.0, AST 23, ALT 18, total protein 7.5, total bilirubin 0.6, hemoglobin 12.6, hematocrit 37.0, WBC 6.1, platelet 209, UA unremarkable, urine drug screen negative.  CT head:  Positive for small acute or subacute Right PCA territory infarct affecting the inferior right occipital pole. Correlate for Left visual field deficits. No hemorrhagic transformation or mass effect. Otherwise stable including chronic distal right ACA infarct, chronic right middle cranial fossa resection. Chronic paranasal sinus disease.  Review of Systems:   Review of Systems  Constitutional: Negative.   HENT: Negative.    Eyes:  Positive for blurred vision.       Left Sided peripheral visual loss.  Respiratory: Negative.    Cardiovascular: Negative.   Gastrointestinal: Negative.   Genitourinary: Negative.   Musculoskeletal: Negative.   Skin: Negative.   Neurological:  Positive for seizures.  Endo/Heme/Allergies: Negative.   Psychiatric/Behavioral:  Positive for depression. The patient is nervous/anxious.     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 He has never used smokeless tobacco. He reports that he does not currently use alcohol. He reports that he  does not use drugs.  Allergies  Allergen Reactions   Depakote [Divalproex Sodium] Other (See Comments)    Arthralgias     Family History  Problem Relation Age of Onset   Cancer - Lung Mother    Cancer - Prostate Father    Family history reviewed and not pertinent.  Prior to Admission medications   Medication Sig Start Date End Date Taking? Authorizing Provider  acetaminophen  (TYLENOL ) 325 MG tablet Take 650 mg  by mouth every 6 (six) hours as needed for mild pain (pain score 1-3) or moderate pain (pain score 4-6).   Yes [provider]  ALPRAZolam  (XANAX ) 0.25 MG tablet Take 1 tablet (0.25 mg total) by mouth 2 (two) times daily as needed for anxiety. 09/30/22  Yes Charlanne Fredia CROME, MD  brimonidine -timolol  (COMBIGAN ) 0.2-0.5 % ophthalmic solution Place 1 drop into both eyes every 12 (twelve) hours.  01/05/18  Yes [provider]  calcium  carbonate (TUMS SMOOTHIES) 750 MG chewable tablet Chew 2 tablets by mouth 4 (four) times daily as needed for heartburn.   Yes [provider]  camphor-menthol VIKKI) lotion Apply 1 Application topically every 8 (eight) hours as needed for itching.   Yes [provider]  cetirizine  (ZYRTEC ) 10 MG tablet Take 10 mg by mouth as needed for allergies.   Yes [provider]  cholecalciferol (VITAMIN D ) 400 units TABS tablet Take 400 Units by mouth 2 (two) times daily.   Yes [provider]  clobetasol  cream (TEMOVATE ) 0.05 % Apply 1 application  topically 2 (two) times daily as needed (rash).   Yes [provider]  DILANTIN  100 MG ER capsule Take 1 capsule (100 mg total) by mouth every morning AND 2 capsules (200 mg total) at bedtime. Take 100 mg 2 capsules evening. As of 08/20/23). Patient taking differently: Take 1 capsule (100 mg total) by mouth every morning AND 2 capsules (200 mg total) at bedtime.  10/29/23 02/14/24 Yes Camara, Amadou, MD  econazole nitrate 1 % cream Apply 1 Application topically 2 (two) times daily as needed.   Yes [provider]  finasteride  (PROSCAR ) 5 MG tablet Take 5 mg by mouth every morning. 02/12/19  Yes [provider]  flecainide  (TAMBOCOR ) 50 MG tablet Take 50 mg by mouth 2 (two) times daily.   Yes [provider]  hydrALAZINE  (APRESOLINE ) 10 MG tablet Take 1 tablet (10 mg total) by mouth 2 (two) times daily as needed. 06/25/22  Yes Wert, Tawni, NP  lacosamide   (VIMPAT ) 200 MG TABS tablet Take 1 tablet (200 mg total) by mouth 2 (two) times daily. 01/23/24  Yes Wert, Tawni, NP  latanoprost  (XALATAN ) 0.005 % ophthalmic solution Place 1 drop into both eyes at bedtime.   Yes [provider]  Levetiracetam  750 MG TB24 Take 2,250 mg by mouth every evening.   Yes [provider]  LORazepam  (ATIVAN ) 2 MG/ML concentrated solution Take 0.5 mLs (1 mg total) by mouth as needed for seizure or anxiety. Patient taking differently: Take 1-2 mg by mouth 2 (two) times daily as needed for seizure. 08/18/23  Yes Wert, Christina, NP  polyethylene glycol (MIRALAX  / GLYCOLAX ) 17 g packet Take 17 g by mouth every other day.   Yes [provider]  sodium chloride  1 g tablet Take 2 tablets (2 g total) by mouth 3 (three) times daily. 06/24/23  Yes Gupta, Anjali L, MD  talc powder Apply 1 Application topically 2 (two) times daily.   Yes [provider]  tamsulosin  (FLOMAX ) 0.4 MG CAPS capsule Take 0.4 mg by mouth in the morning.   Yes [provider]  triamcinolone cream (KENALOG) 0.1 % Apply 1 Application topically 2 (two) times daily as needed (Rash).   Yes [provider]  phenytoin  (DILANTIN ) 100 MG ER capsule Take 1 capsule (100 mg total) by mouth every morning for 2 days. Give with scheduled dilantin  Patient not taking: Reported on 02/14/2024 01/13/24 01/15/24  Gil Greig BRAVO, NP    Physical Exam: Vitals:   02/14/24 1118 02/14/24 1118 02/14/24 1205 02/14/24 1330  BP: (!) 195/91  (!) 213/93 (!) 183/75  Pulse: 62  (!) 59 62  Resp: 16  17 16   Temp:    97.7 F (36.5 C)  TempSrc:    Oral  SpO2: 100%  100% 100%  Weight:  77.1 kg    Height:  5' 8 (1.727 m)      Constitutional: Calm, comfortable, not in any acute distress. Vitals:   02/14/24 1118 02/14/24 1118 02/14/24 1205 02/14/24 1330  BP: (!) 195/91  (!) 213/93 (!) 183/75  Pulse: 62  (!) 59 62  Resp: 16  17 16   Temp:    97.7 F (36.5 C)  TempSrc:    Oral  SpO2:  100%  100% 100%  Weight:  77.1 kg    Height:  5' 8 (1.727 m)     Eyes: PERRL, lids and conjunctivae normal ENMT: Mucous membranes are moist. Posterior pharynx clear of any exudate or lesions. Neck: normal, supple, no masses, no thyromegaly Respiratory: Clear to auscultation bilaterally, no wheezing, no crackles. Normal respiratory effort. No accessory muscle use.  Cardiovascular: S1+ S2 audible,  Regular rate and rhythm, no murmurs / rubs / gallops. No extremity edema. 2+ pedal pulses.  Abdomen: Soft, no tenderness, no masses palpated. No hepatosplenomegaly. Bowel sounds positive.  Musculoskeletal: no clubbing / cyanosis. No joint deformity upper and lower extremities. Good ROM, no contractures.  Skin: no rashes, lesions, ulcers. No induration Neurologic: CN 2-12 grossly intact. Sensation intact, DTR normal. Strength 5/5 in all 4.  Decreased peripheral vision. Psychiatric: Normal judgment and insight. Alert and oriented x 3. Normal mood.     Labs on Admission: I have personally reviewed following labs and imaging studies  CBC: Recent Labs  Lab 02/14/24 1006 02/14/24 1012  WBC 6.1  --   NEUTROABS 3.2  --   HGB 12.9* 12.6*  HCT 36.7* 37.0*  MCV 93.4  --   PLT 209  --    Basic Metabolic Panel: Recent Labs  Lab 02/14/24 1006 02/14/24 1012 02/14/24 1026  NA 135 136  --   K 4.2 4.3  --   CL 105 103  --   CO2 20*  --   --   GLUCOSE 95 97  --   BUN 13 14  --   CREATININE 0.74 0.70  --   CALCIUM  8.9  --   --   MG  --   --  2.0   GFR: Estimated Creatinine Clearance: 64.1 mL/min (by C-G formula based on SCr of 0.7 mg/dL). Liver Function Tests: Recent Labs  Lab 02/14/24 1006  AST 23  ALT 18  ALKPHOS 126  BILITOT 0.6  PROT 7.5  ALBUMIN 4.0   No results for input(s): LIPASE, AMYLASE in the last 168 hours. No results for input(s): AMMONIA in the last 168 hours. Coagulation Profile: Recent Labs  Lab 02/14/24 1006  INR 1.0   Cardiac Enzymes: No results for  input(s):  CKTOTAL, CKMB, CKMBINDEX, TROPONINI in the last 168 hours. BNP (last 3 results) No results for input(s): PROBNP in the last 8760 hours. HbA1C: No results for input(s): HGBA1C in the last 72 hours. CBG: Recent Labs  Lab 02/14/24 1000  GLUCAP 90   Lipid Profile: No results for input(s): CHOL, HDL, LDLCALC, TRIG, CHOLHDL, LDLDIRECT in the last 72 hours. Thyroid  Function Tests: No results for input(s): TSH, T4TOTAL, FREET4, T3FREE, THYROIDAB in the last 72 hours. Anemia Panel: No results for input(s): VITAMINB12, FOLATE, FERRITIN, TIBC, IRON, RETICCTPCT in the last 72 hours. Urine analysis:    Component Value Date/Time   COLORURINE YELLOW 02/14/2024 1135   APPEARANCEUR CLEAR 02/14/2024 1135   LABSPEC 1.011 02/14/2024 1135   PHURINE 7.0 02/14/2024 1135   GLUCOSEU NEGATIVE 02/14/2024 1135   HGBUR NEGATIVE 02/14/2024 1135   BILIRUBINUR NEGATIVE 02/14/2024 1135   KETONESUR NEGATIVE 02/14/2024 1135   PROTEINUR NEGATIVE 02/14/2024 1135   UROBILINOGEN 0.2 04/10/2013 0910   NITRITE NEGATIVE 02/14/2024 1135   LEUKOCYTESUR NEGATIVE 02/14/2024 1135    Radiological Exams on Admission: CT HEAD WO CONTRAST Result Date: 02/14/2024 CLINICAL DATA:  88 year old male with left eye vision changes and abnormal gait since yesterday morning. EXAM: CT HEAD WITHOUT CONTRAST TECHNIQUE: Contiguous axial images were obtained from the base of the skull through the vertex without intravenous contrast. RADIATION DOSE REDUCTION: This exam was performed according to the departmental dose-optimization program which includes automated exposure control, adjustment of the mA and/or kV according to patient size and/or use of iterative reconstruction technique. COMPARISON:  Head CT 08/05/2023. FINDINGS: Brain: Chronic right middle cranial fossa resection cavity, subtotal absence of the right temporal lobe. Stable cerebral volume from earlier this year. New confluent  hypodensity in the right occipital pole since February (coronal image 15 and series 5, image 26), along the inferior calcarine sulcus. No associated hemorrhage or mass effect. See sagittal image 26. No other acute cortically based infarct identified. Stable encephalomalacia in the distal right ACA territory, right cingulate gyrus (coronal image 43). No superimposed No midline shift, ventriculomegaly, mass effect, acute intracranial hemorrhage. Vascular: Calcified atherosclerosis at the skull base. No suspicious intracranial vascular hyperdensity. Skull: Chronic right frontotemporal craniotomy. No acute osseous abnormality identified. Sinuses/Orbits: Chronic paranasal sinus disease with up to moderate mucoperiosteal thickening bilaterally has not significantly changed. Tympanic cavities and mastoids remain well aerated. Other: No acute orbit or scalp soft tissue finding. Chronic postoperative changes to both globes. IMPRESSION: 1. Positive for small acute or subacute Right PCA territory infarct affecting the inferior right occipital pole. Correlate for Left visual field deficits. No hemorrhagic transformation or mass effect. 2. Otherwise stable including chronic distal right ACA infarct, chronic right middle cranial fossa resection. Chronic paranasal sinus disease. Electronically Signed   By: VEAR Hurst M.D.   On: 02/14/2024 10:48    EKG: Independently reviewed.  Atrial paced rhythm, Incomplete bundle branch block.  Assessment/Plan Principal Problem:   Acute CVA (cerebrovascular accident) (HCC) Active Problems:   Complex partial seizure (HCC)   Chronic atrial fibrillation   Parkinsonian syndrome associated with symptomatic orthostatic hypotension (HCC)   Adjustment disorder with anxious mood   Hyperlipidemia   ICD (implantable cardioverter-defibrillator) in place   Status post VNS (vagus nerve stimulator) placement   Neuropathy, peripheral   Benign prostatic hyperplasia with urinary  frequency   Acute/subacute CVA: Patient presented with worsening left-sided peripheral vision field. CT head > small acute or subacute RCA territory infarct involving inferior right occipital lobe. Start CVA protocol, Frequent neurochecks  every 4 hours Allow permissive hypertension for 48 hours. Obtain 2D echocardiogram, carotid duplex. MRI cannot be obtained since patient has pacemaker and vagus nerve stimulator. N.p.o. until bedside swallow evaluation completed Obtain lipid profile, TSH, hemoglobin A1c. Neurology is consulted. EDP discussed the case with Dr. Matthews , recommended CVA workup PT and OT evaluation and speech evaluation.  Seizure disorder: Patient takes 3 different antiseizure medications. Resume Dilantin , Keppra  and Vimpat . Follow-up neurology recommendation.  Chronic atrial fibrillation: Heart rate is controlled, Continue flecainide , patient is not on anticoagulation.  BPH: Continue Proscar  and  Flomax .  Permanent pacemaker: Stable, No concerns,  follow-up outpatient  Hyperlipidemia Start Lipitor , obtain lipid profile  Adjustment disorder with anxious mood. Continue alprazolam .  Parkinson's disease: Not on anti-Parkinson's medication.   DVT prophylaxis: SCDs Code Status: DNR Family Communication: Wife at bed side Disposition Plan:    Status is: Inpatient Remains inpatient appropriate because: Inpatient   Consults called: Neurology Admission status: Inpatient   Darcel Dawley MD [facility]   If 7PM-7AM, please contact night-coverage   02/14/2024, 2:57 PM

## 2024-02-14 NOTE — Consult Note (Addendum)
 NEUROLOGY CONSULT NOTE   Date of service: February 14, 2024 Patient Name: Cameron Potts MRN:  [id number] DOB:  [birth year] Chief Complaint: Fluctuating left sided weakness and speech disturbance Requesting Provider: Leotis Bogus, MD  History of Present Illness  Cameron Potts is a 88 y.o. 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  Workup in the ED with CT head revealed a small acute/subacute right PCA territory infarct affecting the inferior right occipital pole for which neurology was consulted.  Shortly after neurology consult, a code stroke was activated from the floor as the bedside nurse noted a fluctuating exam with transient left facial droop, left hand weakness, slurred speech, and aphasia.  The nurse noted that the symptoms were possibly worse with lowering blood pressure and improved with higher blood pressures.  Of note, patient's wife reported at his last outpatient neurology visit with Dr. Gregg in March 2025, patient has occasional events where he will have a full body jerking motion, hold onto his walker, and will be unable to walk requiring assistance to transfer to bed.  They also noted at this time that the patient was sleeping more with worsening balance and mood swings.  LKW: 8/30 08:00 Modified rankin score: 4-Needs assistance to walk and tend to bodily needs IV Thrombolysis: No, patient is outside of the thrombolytic therapy time window EVT: Nb, presentation is not consistent with LVO  NIHSS components Score: Comment  1a Level of Conscious 0[x]  1[]  2[]  3[]      1b LOC  Questions 0[x]  1[]  2[]       1c LOC Commands 0[x]  1[]  2[]       2 Best Gaze 0[x]  1[]  2[]       3 Visual 0[]  1[x]  2[]  3[]     Chronic   4 Facial Palsy 0[x]  1[]  2[]  3[]      5a Motor Arm - left 0[x]  1[]  2[]  3[]  4[]  UN[]    5b Motor Arm - Right 0[x]  1[]  2[]  3[]  4[]  UN[]    6a Motor Leg - Left 0[x]  1[]  2[]  3[]  4[]  UN[]    6b Motor Leg - Right 0[x]  1[]  2[]  3[]  4[]  UN[]    7 Limb Ataxia 0[x]  1[]  2[]  UN[]      8 Sensory 0[x]  1[]  2[]  UN[]      9 Best Language 0[x]  1[]  2[]  3[]      10 Dysarthria 0[x]  1[]  2[]  UN[]      11 Extinct. and Inattention 0[]  1[x]  2[]       TOTAL:  2    ROS  Comprehensive ROS performed and pertinent positives documented in HPI  Past History   Past Medical History:  Diagnosis Date   Abnormality of gait 12/21/2013   Atrial fibrillation (HCC)    Blind left eye    Carcinoma in situ of prostate    Cardiac arrest (HCC)    Glaucoma    Hypercholesteremia    Hyperlipidemia    Metabolic encephalopathy    Mild left ventricular systolic dysfunction    Orthostatic hypotension    Presence of other specified functional implants    PSA elevation    Right frontal lobe lesion    Seizures (HCC)    Tinea pedis  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 He has never used smokeless tobacco. He reports that he does not currently use alcohol. He reports that he does not use drugs.  Allergies  Allergen Reactions   Depakote [Divalproex Sodium] Other (See  Comments)    Arthralgias     Medications   Current Facility-Administered Medications:    [START ON 02/15/2024]  stroke: early stages of recovery book, , Does not apply, Once, Leotis, Pardeep, MD   0.9 %  sodium chloride  infusion, , Intravenous, Continuous, Leotis, Pardeep, MD, Last Rate: 40 mL/hr at 02/14/24 1513, New Bag at 02/14/24 1513   acetaminophen  (TYLENOL ) tablet 650 mg, 650 mg, Oral, Q4H PRN **OR** acetaminophen  (TYLENOL ) 160 MG/5ML solution 650 mg, 650 mg, Per Tube, Q4H PRN **OR** acetaminophen  (TYLENOL ) suppository 650 mg, 650 mg, Rectal, Q4H PRN, Leotis, Pardeep, MD   ALPRAZolam  (XANAX ) tablet 0.25 mg, 0.25 mg, Oral, BID PRN, Zackowski, Scott, MD   atorvastatin  (LIPITOR) tablet 20 mg, 20 mg, Oral, Daily, Leotis, Pardeep, MD   finasteride  (PROSCAR ) tablet 5 mg, 5 mg, Oral, q morning, Khatri, Pardeep, MD, 5 mg at 02/14/24 1509   flecainide  (TAMBOCOR ) tablet 50 mg, 50 mg, Oral, BID, Zackowski, Scott, MD, 50 mg at 02/14/24 1422   lacosamide  (VIMPAT ) tablet 200 mg, 200 mg, Oral, BID, Zackowski, Scott, MD, 200 mg at 02/14/24 1509   levETIRAcetam  (KEPPRA  XR) 24 hr tablet 2,250 mg, 2,250 mg, Oral, QPM, Zackowski, Scott, MD   LORazepam  (ATIVAN ) 2 MG/ML concentrated solution 1-2 mg, 1-2 mg, Oral, BID PRN, Zackowski, Scott, MD   phenytoin  (DILANTIN ) ER capsule 100 mg, 100 mg, Oral, BH-q7a, 100 mg at 02/14/24 1422 **AND** phenytoin  (DILANTIN ) ER capsule 200 mg, 200 mg, Oral, QHS, Khatri, Pardeep, MD   senna-docusate (Senokot-S) tablet 1 tablet, 1 tablet, Oral, QHS PRN, Leotis, Pardeep, MD   sodium chloride  tablet 2 g, 2 g, Oral, TID, Leotis Bogus, MD   [START ON 02/15/2024] tamsulosin  (FLOMAX ) capsule 0.4 mg, 0.4 mg, Oral, q AM, Zackowski, Scott, MD  Vitals   Vitals:   02/14/24 1118 02/14/24 1205 02/14/24 1330 02/14/24 1515  BP:  (!) 213/93 (!) 183/75 (!) 196/74  Pulse:  (!) 59 62 60  Resp:  17 16 16   Temp:   97.7 F (36.5 C)   TempSrc:   Oral   SpO2:  100% 100% 100%  Weight: 77.1  kg     Height: 5' 8 (1.727 m)       Body mass index is 25.85 kg/m.  Physical Exam   Constitutional: Appears well-developed and well-nourished. In no acute distress Psych: Affect appropriate to situation. Calm and cooperative with exam, flat affect.  Eyes: No scleral injection.  HENT: No OP obstruction.  Head: Normocephalic.  Cardiovascular: Atrial paced rhythm on monitor, normal rate Respiratory: Effort normal, non-labored breathing on room air  GI: Soft.  No distension. There is no tenderness.  Skin: WDI.   Neurologic Examination   Mental Status: Patient is awake, alert, oriented to person, place, month, year, and situation. Patient is able to give some details regarding history of presentation.  He is able to recall that he is admitted to the hospital due to stroke findings on CT head. No signs of aphasia or neglect Cranial Nerves: II: Left inferior quadrantanopia  Potts,IV, VI: EOMI without ptosis or diploplia.  V: Facial sensation is intact and symmetric to light touch VII: Face is symmetric resting and with movement VIII: Hearing is intact to voice X: Palate elevates symmetrically XI: Shoulder shrug is symmetric. XII: Tongue protrudes midline  Motor: Tone is normal. Bulk is normal. 5/5 strength was present in all four extremities without asymmetry or weakness.  Left upper extremity with fine tremor at rest noted, not consistent with seizure activity.  Sensory: Sensation is symmetric to light touch and temperature in the arms and legs. He does appear to have some extinction on the left with DDS. Plantars: Toes are downgoing bilaterally.  Cerebellar: FNF and HKS are intact bilaterally  Labs/Imaging/Neurodiagnostic studies   CBC:  Recent Labs  Lab 03-14-24 1006 03-14-24 1012  WBC 6.1  --   NEUTROABS 3.2  --   HGB 12.9* 12.6*  HCT 36.7* 37.0*  MCV 93.4  --   PLT 209  --    Basic Metabolic Panel:  Lab Results  Component Value Date   NA 136 03/14/2024   K 4.3  Mar 14, 2024   CO2 20 (L) 03-14-24   GLUCOSE 97 Mar 14, 2024   BUN 14 03/14/2024   CREATININE 0.70 03/14/24   CALCIUM  8.9 03-14-2024   GFRNONAA >60 March 14, 2024   GFRAA >90 01/03/2020   Lipid Panel:  Lab Results  Component Value Date   LDLCALC 137 10/17/2023   HgbA1c: No results found for: HGBA1C Urine Drug Screen:     Component Value Date/Time   LABOPIA NONE DETECTED Mar 14, 2024 1135   COCAINSCRNUR NONE DETECTED 2024-03-14 1135   LABBENZ NONE DETECTED 03-14-24 1135   AMPHETMU NONE DETECTED March 14, 2024 1135   THCU NONE DETECTED 14-Mar-2024 1135   LABBARB NONE DETECTED 03/14/2024 1135    Alcohol Level     Component Value Date/Time   ETH <15 03-14-2024 1006   INR  Lab Results  Component Value Date   INR 1.0 03-14-24   APTT  Lab Results  Component Value Date   APTT 27 March 14, 2024   AED levels:  Lab Results  Component Value Date   PHENYTOIN  12.4 09/04/2023   LEVETIRACETA 33.9 01/14/2022   CT Head without contrast (Personally reviewed): Positive for small acute or subacute right PCA territory infarct affecting the inferior right occipital lobe.  Correlate for left visual field deficits.  No hemorrhagic transformation or mass effect.  Otherwise stable including chronic distal right ACA infarct, chronic right middle cranial fossa resection.  Chronic paranasal sinus disease.  Repeat CT head code stroke (Personally reviewed): Evolving inferior right occipital lobe infarct.  Stable remote infarct in the high medial right frontal lobe.  Right perianal craniotomy and temporal lobe resection.  Right cavernous internal carotid artery stent in place.  Atherosclerotic changes in the cavernous left ICA.  Mildly advanced atrophy and white matter disease, stable.  CT angio Head and Neck with contrast(Personally reviewed): 1. Near occlusive stenosis of the the mid right p2 segment corresponds with the right occipital infarct. 2. High-grade stenosis in the origin of the right  vertebral artery. 3. High-grade stenosis in the proximal A1 segments bilaterally, distal right segment and moderate segmental stenoses throughout the ACA and MCA branch vessels. 4. Moderate proximal left P1 and P2 segments. Marked attenuation of PCA branch vessels, right greater than left  Neurodiagnostics Overnight EEG, ordered pending       ASSESSMENT   Anothy Bufano Potts is a 88 y.o. 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  CT imaging in the ED revealed a small acute/subacute right PCA territory infarct affecting the inferior right occipital pole for which neurology was consulted.  On admission to the floor, patient's bedside nurse noticed fluctuating left facial droop, left hand weakness, slurred speech, and aphasia for which a code stroke was activated. - Assessment on arrival reveals patient without ongoing or fluctuating left sided symptoms.  Patient did appear to have slight left-sided neglect in addition to his chronic left peripheral vision changes. - CT imaging revealed an evolving inferior right occipital lobe infarct and vessel imaging revealed near occlusive stenosis of the mid right P2 segment associated with right occipital infarct as well as high-grade stenosis of the right vertebral artery origin, proximal A1 segments bilaterally, distal right segment, and moderate segmental stenoses throughout the ACA and MCA branch vessels.  - Presentation is concerning for breakthrough seizures with fluctuating left-sided symptoms in addition to stroke findings on CTH.  Will complete stroke workup due to CT  findings as well as increase patient's home Keppra  dose.  RECOMMENDATIONS  - HgbA1c, fasting lipid panel - MRI of the brain without contrast (Monday due to  PPM and when he is able to obtain his remote for VNS) - Frequent neuro checks - Echocardiogram - Prophylactic therapy- Antiplatelet med: Aspirin  - dose 325mg  PO or 300mg  PR and clopidogrel  75 mg PO daily - Risk factor modification - Telemetry monitoring - PT consult, OT consult, Speech consult - Stroke team to follow  Concern for breakthrough seizures - Continuous EEG with video - Seizure precautions - Continue home Dilantin , lacosamide  - Patient takes Keppra  XR 2250 mg nightly at home, will increase total dose to 1,500 mg BID  ______________________________________________________________________  Signed, Stevi W Toberman, NP [facility]   Attending Neurohospitalist Addendum Patient seen and examined with APP/Resident. Agree with the history and physical as documented above. Agree with the plan as documented, which I helped formulate. I have edited the note above to reflect my full findings and recommendations. I have independently reviewed the chart, obtained history, review of systems and examined the patient.I have personally reviewed pertinent head/neck/spine imaging (CT/MRI). Please feel free to call with any questions.  -- Elida Ross, MD [facility] [phone]  If 7pm- 7am, please page neurology on call as listed in [facility].

## 2024-02-14 NOTE — Plan of Care (Signed)
  Problem: Education: Goal: Knowledge of disease or condition will improve Outcome: Progressing Goal: Knowledge of secondary prevention will improve (MUST DOCUMENT ALL) Outcome: Progressing Goal: Knowledge of patient specific risk factors will improve (DELETE if not current risk factor) Outcome: Progressing   Problem: Ischemic Stroke/TIA Tissue Perfusion: Goal: Complications of ischemic stroke/TIA will be minimized Outcome: Progressing   Problem: Coping: Goal: Will verbalize positive feelings about self Outcome: Progressing Goal: Will identify appropriate support needs Outcome: Progressing   Problem: Health Behavior/Discharge Planning: Goal: Ability to manage health-related needs will improve Outcome: Progressing Goal: Goals will be collaboratively established with patient/family Outcome: Progressing   Problem: Self-Care: Goal: Ability to participate in self-care as condition permits will improve Outcome: Progressing Goal: Verbalization of feelings and concerns over difficulty with self-care will improve Outcome: Progressing Goal: Ability to communicate needs accurately will improve Outcome: Progressing   Problem: Nutrition: Goal: Risk of aspiration will decrease Outcome: Progressing Goal: Dietary intake will improve Outcome: Progressing   Problem: Education: Goal: Knowledge of General Education information will improve Description: Including pain rating scale, medication(s)/side effects and non-pharmacologic comfort measures Outcome: Progressing   Problem: Clinical Measurements: Goal: Ability to maintain clinical measurements within normal limits will improve Outcome: Progressing Goal: Will remain free from infection Outcome: Progressing Goal: Diagnostic test results will improve Outcome: Progressing Goal: Respiratory complications will improve Outcome: Progressing Goal: Cardiovascular complication will be avoided Outcome: Progressing   Problem:  Activity: Goal: Risk for activity intolerance will decrease Outcome: Progressing   Problem: Nutrition: Goal: Adequate nutrition will be maintained Outcome: Progressing   Problem: Coping: Goal: Level of anxiety will decrease Outcome: Progressing   Problem: Elimination: Goal: Will not experience complications related to bowel motility Outcome: Progressing Goal: Will not experience complications related to urinary retention Outcome: Progressing   Problem: Pain Managment: Goal: General experience of comfort will improve and/or be controlled Outcome: Progressing   Problem: Safety: Goal: Ability to remain free from injury will improve Outcome: Progressing   Problem: Skin Integrity: Goal: Risk for impaired skin integrity will decrease Outcome: Progressing

## 2024-02-14 NOTE — Progress Notes (Signed)
 Code stroke called around 1624. Pt's NIH initially on admission was a 1. Pt only had hemianopia but upon asking the pt more questions for his History pt was noted to have slurred speech, have difficulty finding his words, slight tremor of the lt arm as well as lt side neglect. Pt's sx came & went a few times within a 30 min timeframe. New orders for a repeat ct of the pt's head. Pt's wife came about 10 mins after pt left for ct scan & was updated. Pt was back to his baseline by the time he went down to get the repeat scan.pt' s bp was also elevated in the 200s initially. Pt's provider was updated. This RN will continue to monitor the pt. Adonte Vanriper R, RN

## 2024-02-14 NOTE — ED Notes (Signed)
Wife is waiting in lobby

## 2024-02-14 NOTE — Code Documentation (Signed)
 Stroke Response Nurse Documentation Code Documentation  Cameron Potts is a 88 y.o. male admitted to [facility]  on 02-14-2024 for CVA with past medical hx of CVA, Parkinson, Seizure, AF, Pacemaker, Vagus Nerve Stimulator. On No antithrombotic. Code stroke was activated by bedside RN .   Patient on Progressive Care unit where he was LKW at 1614 and now complaining of difficulty with word finding. RN was asking patient questions to complete admission hx when he was unable to find his words.    Stroke team at the bedside after patient activation. Patient to CT with team. NIHSS 2, see documentation for details and code stroke times. Patient with left hemianopia and left neglect on exam. The following imaging was completed:  CT Head and CTA. Patient is not a candidate for IV Thrombolytic due to stroke noted on CT. Patient is not a candidate for IR due to no LVO on CTA.   Care/Plan: continue q 4 hour NIHSS & VS.  EEG  Bedside handoff with RN Cameron Potts, Cameron Potts  Stroke Response RN

## 2024-02-14 NOTE — ED Provider Notes (Signed)
 [facility] AT [facility] Provider Note   CSN: [id number] Arrival date & time: 02/14/24  9064     Patient presents with: Eye Problem and Gait Problem   Cameron Potts is a 88 y.o. male.   Patient brought in from [facility].  Brought in by EMS.  Patient noted somewhere between 8 and 9 in the morning visual field on the left side being worse than baseline.  Has had longstanding visual field abnormalities there.  Has a history of fairly significant seizures on 3 antiseizure medicines.  The nurse that takes care of him on this floor felt that there was something wrong with the vision being worse.  And contacted the head nurse which contacted EMS to bring him in.  Patient denies any speech problems or any upper extremity lower extremity weakness.  Denies any seizure activity.  Patient normally walks with a walker.  Patient on blood thinner.  Patient has a vagal stimulator and a pacemaker.  However he did have an MRI down at the [facility] in [place]  since he has had both these devices in place.  Past medical history [facility] for glaucoma hypercholesterolemia right frontal lobe lesion visual defect left eye history of cardiac arrest hyperlipidemia history of atrial fibrillation metabolic encephalopathy.  No mention of seizures but apparently has a history of seizures.  Patient on 3 antiseizure medicines.  Patient is a DNR.  Patient's on Dilantin  100 mg extended release patient on Vimpat  supplemented with Ativan  I think his [facility] also list Depakote.       Prior to Admission medications   Medication Sig Start Date End Date Taking? Authorizing Provider  acetaminophen  (TYLENOL ) 325 MG tablet Take 650 mg by mouth every 6 (six) hours as needed for mild pain (pain score 1-3) or moderate pain (pain score 4-6).    [provider]  ALPRAZolam  (XANAX ) 0.25 MG tablet Take 1 tablet (0.25 mg total) by mouth 2 (two) times daily as needed for  anxiety. 09/30/22   Charlanne Fredia CROME, MD  brimonidine -timolol  (COMBIGAN ) 0.2-0.5 % ophthalmic solution Place 1 drop into both eyes every 12 (twelve) hours.  01/05/18   [provider]  calcium  carbonate (TUMS SMOOTHIES) 750 MG chewable tablet Chew 2 tablets by mouth 4 (four) times daily as needed for heartburn.    [provider]  camphor-menthol VIKKI) lotion Apply 1 Application topically every 8 (eight) hours as needed for itching.    [provider]  cetirizine  (ZYRTEC ) 10 MG tablet Take 10 mg by mouth as needed for allergies.    [provider]  cholecalciferol (VITAMIN D ) 400 units TABS tablet Take 400 Units by mouth 2 (two) times daily.    [provider]  clobetasol  cream (TEMOVATE ) 0.05 % Apply 1 application  topically 2 (two) times daily as needed (rash).    [provider]  DILANTIN  100 MG ER capsule Take 1 capsule (100 mg total) by mouth every morning AND 2 capsules (200 mg total) at bedtime. Take 100 mg 2 capsules evening. As of 08/20/23). 10/29/23 01/27/24  Gregg Lek, MD  econazole nitrate 1 % cream Apply 1 Application topically 2 (two) times daily as needed.    [provider]  finasteride  (PROSCAR ) 5 MG tablet Take 5 mg by mouth every morning. 02/12/19   [provider]  flecainide  (TAMBOCOR ) 50 MG tablet Take 50 mg by mouth 2 (two) times daily.    [provider]  hydrALAZINE  (APRESOLINE ) 10 MG  tablet Take 1 tablet (10 mg total) by mouth 2 (two) times daily as needed. 06/25/22   Darlean Maus, NP  lacosamide  (VIMPAT ) 200 MG TABS tablet Take 1 tablet (200 mg total) by mouth 2 (two) times daily. 01/23/24   Darlean Maus, NP  latanoprost  (XALATAN ) 0.005 % ophthalmic solution Place 1 drop into both eyes at bedtime.    [provider]  Levetiracetam  750 MG TB24 Take 2,250 mg by mouth every evening.    [provider]  LORazepam  (ATIVAN ) 2 MG/ML concentrated solution Take 0.5 mLs (1 mg total) by  mouth as needed for seizure or anxiety. 08/18/23   Darlean Maus, NP  phenytoin  (DILANTIN ) 100 MG ER capsule Take 1 capsule (100 mg total) by mouth every morning for 2 days. Give with scheduled dilantin  01/13/24 01/15/24  Gil Greig BRAVO, NP  polyethylene glycol (MIRALAX  / GLYCOLAX ) 17 g packet Take 17 g by mouth as needed.    [provider]  polyethylene glycol (MIRALAX  / GLYCOLAX ) packet Take 17 g by mouth every other day.    [provider]  sodium chloride  1 g tablet Take 2 tablets (2 g total) by mouth 3 (three) times daily. 06/24/23   Gupta, Anjali L, MD  talc powder Apply topically 2 (two) times daily as needed.    [provider]  tamsulosin  (FLOMAX ) 0.4 MG CAPS capsule Take 0.4 mg by mouth in the morning.    [provider]  triamcinolone cream (KENALOG) 0.1 % Apply 1 Application topically 2 (two) times daily as needed.    [provider]    Allergies: Depakote [divalproex sodium]    Review of Systems  Constitutional:  Negative for chills and fever.  HENT:  Negative for ear pain and sore throat.   Eyes:  Positive for visual disturbance. Negative for pain.  Respiratory:  Negative for cough and shortness of breath.   Cardiovascular:  Negative for chest pain and palpitations.  Gastrointestinal:  Negative for abdominal pain and vomiting.  Genitourinary:  Negative for dysuria and hematuria.  Musculoskeletal:  Negative for arthralgias and back pain.  Skin:  Negative for color change and rash.  Neurological:  Positive for seizures. Negative for syncope.  All other systems reviewed and are negative.   Updated Vital Signs BP (!) 195/91   Pulse 62   Temp 97.9 F (36.6 C)   Resp 16   Ht 1.727 m (5' 8)   Wt 77.1 kg   SpO2 100%   BMI 25.85 kg/m   Physical Exam Vitals and nursing note reviewed.  Constitutional:      General: He is not in acute distress.    Appearance: Normal appearance. He is well-developed.  HENT:     Head: Normocephalic  and atraumatic.     Mouth/Throat:     Mouth: Mucous membranes are moist.  Eyes:     Extraocular Movements: Extraocular movements intact.     Conjunctiva/sclera: Conjunctivae normal.     Pupils: Pupils are equal, round, and reactive to light.  Cardiovascular:     Rate and Rhythm: Normal rate and regular rhythm.     Heart sounds: No murmur heard. Pulmonary:     Effort: Pulmonary effort is normal. No respiratory distress.     Breath sounds: Normal breath sounds.  Abdominal:     Palpations: Abdomen is soft.     Tenderness: There is no abdominal tenderness.  Musculoskeletal:        General: No swelling.     Cervical  back: Normal range of motion and neck supple. No rigidity.  Skin:    General: Skin is warm and dry.     Capillary Refill: Capillary refill takes less than 2 seconds.  Neurological:     General: No focal deficit present.     Mental Status: He is alert and oriented to person, place, and time.     Cranial Nerves: Cranial nerve deficit present.     Sensory: No sensory deficit.     Motor: No weakness.     Comments: Clinically has a left-sided visual field defect.  Difficult to assess if this is worse than baseline.  Psychiatric:        Mood and Affect: Mood normal.     (all labs ordered are listed, but only abnormal results are displayed) Labs Reviewed  CBC - Abnormal; Notable for the following components:      Result Value   RBC 3.93 (*)    Hemoglobin 12.9 (*)    HCT 36.7 (*)    All other components within normal limits  I-STAT CHEM 8, ED - Abnormal; Notable for the following components:   Calcium , Ion 1.13 (*)    Hemoglobin 12.6 (*)    HCT 37.0 (*)    All other components within normal limits  DIFFERENTIAL  PROTIME-INR  APTT  COMPREHENSIVE METABOLIC PANEL WITH GFR  ETHANOL  MAGNESIUM  CBG MONITORING, ED    EKG: EKG Interpretation Date/Time:  Saturday February 14 2024 11:17:58 EDT Ventricular Rate:  62 PR Interval:    QRS Duration:  110 QT  Interval:  451 QTC Calculation: 458 R Axis:   56  Text Interpretation: ATRIAL PACED RHYTHM Incomplete left bundle branch block Confirmed by Jordan Pardini [phone]) on 02/14/2024 11:25:57 AM  Radiology: CT HEAD WO CONTRAST Result Date: 02/14/2024 CLINICAL DATA:  88 year old male with left eye vision changes and abnormal gait since yesterday morning. EXAM: CT HEAD WITHOUT CONTRAST TECHNIQUE: Contiguous axial images were obtained from the base of the skull through the vertex without intravenous contrast. RADIATION DOSE REDUCTION: This exam was performed according to the departmental dose-optimization program which includes automated exposure control, adjustment of the mA and/or kV according to patient size and/or use of iterative reconstruction technique. COMPARISON:  Head CT 08/05/2023. FINDINGS: Brain: Chronic right middle cranial fossa resection cavity, subtotal absence of the right temporal lobe. Stable cerebral volume from earlier this year. New confluent hypodensity in the right occipital pole since February (coronal image 15 and series 5, image 26), along the inferior calcarine sulcus. No associated hemorrhage or mass effect. See sagittal image 26. No other acute cortically based infarct identified. Stable encephalomalacia in the distal right ACA territory, right cingulate gyrus (coronal image 43). No superimposed No midline shift, ventriculomegaly, mass effect, acute intracranial hemorrhage. Vascular: Calcified atherosclerosis at the skull base. No suspicious intracranial vascular hyperdensity. Skull: Chronic right frontotemporal craniotomy. No acute osseous abnormality identified. Sinuses/Orbits: Chronic paranasal sinus disease with up to moderate mucoperiosteal thickening bilaterally has not significantly changed. Tympanic cavities and mastoids remain well aerated. Other: No acute orbit or scalp soft tissue finding. Chronic postoperative changes to both globes. IMPRESSION: 1. Positive for small acute  or subacute Right PCA territory infarct affecting the inferior right occipital pole. Correlate for Left visual field deficits. No hemorrhagic transformation or mass effect. 2. Otherwise stable including chronic distal right ACA infarct, chronic right middle cranial fossa resection. Chronic paranasal sinus disease. Electronically Signed   By: VEAR Hurst M.D.   On: 02/14/2024 10:48  Procedures   Medications Ordered in the ED - No data to display                                  Medical Decision Making Amount and/or Complexity of Data Reviewed Labs: ordered. Radiology: ordered.  Risk Decision regarding hospitalization.   Head CT shows evidence of an acute infarct that would affect vision on the left side.  Positive for small acute/subacute right PCA territorial infarct affecting the inferior right of supra lobe correlate with left visual field deficit.  No hemorrhagic transformation or mass effect.  Otherwise stable including chronic distal right ACA infarct chronic right middle cranial fossa resection and chronic paranasal sinus disease.  EKG consistent with atrial paced rhythm.  Blood sugar 90.  CBC white count 6.1 hemoglobin 12.9 platelets 209.  I-STAT Chem-8 without significant electrolyte abnormalities.  Creatinine normal.  Magnesium is pending.  Will send in levels for his antiseizure medicine.  Based on this clearly will need admission.  Will contact neurohospitalist.  And patient followed by [facility] will contact hospitalist for admission.  Based on the timing of this being yesterday out of the window for any acute intervention.  CRITICAL CARE Performed by: Raquel Sayres Total critical care time: 45 minutes Critical care time was exclusive of separately billable procedures and treating other patients. Critical care was necessary to treat or prevent imminent or life-threatening deterioration. Critical care was time spent personally by me on the following activities:  development of treatment plan with patient and/or surrogate as well as nursing, discussions with consultants, evaluation of patient's response to treatment, examination of patient, obtaining history from patient or surrogate, ordering and performing treatments and interventions, ordering and review of laboratory studies, ordering and review of radiographic studies, pulse oximetry and re-evaluation of patient's condition.   Final diagnoses:  Cerebrovascular accident (CVA), unspecified mechanism [facility])    ED Discharge Orders     None          Geraldene Hamilton, MD 02/14/24 1156

## 2024-02-15 ENCOUNTER — Inpatient Hospital Stay (HOSPITAL_COMMUNITY)

## 2024-02-15 DIAGNOSIS — I639 Cerebral infarction, unspecified: Secondary | ICD-10-CM | POA: Diagnosis not present

## 2024-02-15 DIAGNOSIS — G40909 Epilepsy, unspecified, not intractable, without status epilepticus: Secondary | ICD-10-CM | POA: Diagnosis not present

## 2024-02-15 DIAGNOSIS — I63531 Cerebral infarction due to unspecified occlusion or stenosis of right posterior cerebral artery: Secondary | ICD-10-CM

## 2024-02-15 DIAGNOSIS — E785 Hyperlipidemia, unspecified: Secondary | ICD-10-CM

## 2024-02-15 LAB — COMPREHENSIVE METABOLIC PANEL WITH GFR
ALT: 16 U/L (ref 0–44)
AST: 21 U/L (ref 15–41)
Albumin: 3.4 g/dL — ABNORMAL LOW (ref 3.5–5.0)
Alkaline Phosphatase: 116 U/L (ref 38–126)
Anion gap: 10 (ref 5–15)
BUN: 10 mg/dL (ref 8–23)
CO2: 22 mmol/L (ref 22–32)
Calcium: 8.6 mg/dL — ABNORMAL LOW (ref 8.9–10.3)
Chloride: 103 mmol/L (ref 98–111)
Creatinine, Ser: 0.65 mg/dL (ref 0.61–1.24)
GFR, Estimated: >60 mL/min (ref >60)
Glucose, Bld: 90 mg/dL (ref 70–99)
Potassium: 4.2 mmol/L (ref 3.5–5.1)
Sodium: 135 mmol/L (ref 135–145)
Total Bilirubin: 0.7 mg/dL (ref 0.0–1.2)
Total Protein: 6.4 g/dL — ABNORMAL LOW (ref 6.5–8.1)

## 2024-02-15 LAB — LIPID PANEL
Cholesterol: 184 mg/dL (ref 0–200)
HDL: 59 mg/dL (ref >40)
LDL Cholesterol: 113 mg/dL — ABNORMAL HIGH (ref 0–99)
Total CHOL/HDL Ratio: 3.1 ratio
Triglycerides: 59 mg/dL (ref <150)
VLDL: 12 mg/dL (ref 0–40)

## 2024-02-15 LAB — MAGNESIUM: Magnesium: 1.8 mg/dL (ref 1.7–2.4)

## 2024-02-15 LAB — CBC
HCT: 33.5 % — ABNORMAL LOW (ref 39.0–52.0)
Hemoglobin: 12 g/dL — ABNORMAL LOW (ref 13.0–17.0)
MCH: 32.8 pg (ref 26.0–34.0)
MCHC: 35.8 g/dL (ref 30.0–36.0)
MCV: 91.5 fL (ref 80.0–100.0)
Platelets: 186 K/uL (ref 150–400)
RBC: 3.66 MIL/uL — ABNORMAL LOW (ref 4.22–5.81)
RDW: 13.1 % (ref 11.5–15.5)
WBC: 5.4 K/uL (ref 4.0–10.5)
nRBC: 0 % (ref 0.0–0.2)

## 2024-02-15 LAB — PHOSPHORUS: Phosphorus: 3.9 mg/dL (ref 2.5–4.6)

## 2024-02-15 LAB — PHENYTOIN LEVEL, TOTAL
Phenytoin Lvl: 9.8 ug/mL — ABNORMAL LOW (ref 10.0–20.0)
Phenytoin Lvl: 8.9 ug/mL — ABNORMAL LOW (ref 10.0–20.0)

## 2024-02-15 MED ORDER — LEVETIRACETAM (KEPPRA) 500 MG/5 ML ADULT IV PUSH
1500.0000 mg | Freq: Two times a day (BID) | INTRAVENOUS | Status: DC
  Administered 2024-02-15 – 2024-02-16 (×2): 1500 mg via INTRAVENOUS
  Filled 2024-02-15 (×2): qty 15

## 2024-02-15 MED ORDER — PHENYTOIN SODIUM EXTENDED 100 MG PO CAPS
100.0000 mg | ORAL_CAPSULE | Freq: Every day | ORAL | Status: DC
  Administered 2024-02-16: 100 mg via ORAL
  Filled 2024-02-15: qty 1

## 2024-02-15 MED ORDER — PHENYTOIN SODIUM EXTENDED 100 MG PO CAPS
200.0000 mg | ORAL_CAPSULE | Freq: Every day | ORAL | Status: DC
  Administered 2024-02-15: 200 mg via ORAL
  Filled 2024-02-15: qty 2

## 2024-02-15 NOTE — Evaluation (Signed)
 Physical Therapy Evaluation Patient Details Name: Cameron Potts MRN: [id number] DOB: Sep 26, 1935 Today's Date: 02/15/2024  History of Present Illness  Pt is an 88 y.o. male presenting 8/30 from [facility] with worsening L visual field deficits. CTH Positive for small acute or subacute Right PCA territory infarct affecting the inferior right occipital pole. Correlate for Left visual field deficits. EEG consistent w/ known hx of focal epilepsy arising from right anterior temporal region; cortical dysfunction arising from right temporal region consistent with underlying craniotomy; mild diffuse encephalopathy. PMH: seizure disorder, CVA, HLD, HTN, atrial fibrillation, glaucoma, status post pacemaker, vagal nerve stimulator, gait abnormality, orthostatic hypotension, history of cardiac arrest   Clinical Impression  Pt in bed upon arrival of PT, agreeable to evaluation at this time. Prior to admission the pt was ambulating with use of rollator for all mobility, working with personal trainer at [facility]. The pt was able to complete mobility without physical assistance, but does require increased time for transitions and assist for awareness of lines. The pt was limited in mobility by continuous EEG monitoring, but was able to complete transfer with CGA and RW. Will benefit from continued skilled PT to address endurance and dynamic stability, but is likely able to return home with wife assist and continued skilled PT at [facility].       If plan is discharge home, recommend the following: A little help with walking and/or transfers;A little help with bathing/dressing/bathroom;Assistance with cooking/housework;Direct supervision/assist for medications management;Direct supervision/assist for financial management;Assist for transportation;Supervision due to cognitive status;Help with stairs or ramp for entrance   Can travel by private vehicle        Equipment Recommendations None recommended  by PT  Recommendations for Other Services       Functional Status Assessment Patient has had a recent decline in their functional status and demonstrates the ability to make significant improvements in function in a reasonable and predictable amount of time.     Precautions / Restrictions Precautions Precautions: Fall;Other (comment) (seizure) Recall of Precautions/Restrictions: Intact Precaution/Restrictions Comments: continuous EEG Restrictions Weight Bearing Restrictions Per Provider Order: No      Mobility  Bed Mobility Overal bed mobility: Needs Assistance Bed Mobility: Supine to Sit     Supine to sit: Supervision, Used rails     General bed mobility comments: no assist other than line management    Transfers Overall transfer level: Needs assistance Equipment used: Rolling walker (2 wheels) Transfers: Sit to/from Stand Sit to Stand: Contact guard assist           General transfer comment: CGA to rise from EOB, cues for use of UE on armrests. dependent on UE support to rise from recliner    Ambulation/Gait Ambulation/Gait assistance: Contact guard assist Gait Distance (Feet): 6 Feet Assistive device: Rolling walker (2 wheels) Gait Pattern/deviations: Step-through pattern, Decreased stride length Gait velocity: decreased Gait velocity interpretation: <1.31 ft/sec, indicative of household ambulator   General Gait Details: limited by EEG lines but stable with small steps from EOB to recliner, CGA with VSS.   Balance Overall balance assessment: Needs assistance, Mild deficits observed, not formally tested Sitting-balance support: No upper extremity supported, Feet supported Sitting balance-Leahy Scale: Good     Standing balance support: Bilateral upper extremity supported Standing balance-Leahy Scale: Fair Standing balance comment: dependent on BUE support for dynamic balance  Pertinent Vitals/Pain Pain  Assessment Pain Assessment: No/denies pain    Home Living Family/patient expects to be discharged to:: Assisted living                 Home Equipment: Rollator (4 wheels) Additional Comments: walk in shower, seat, bars, staff helps with meds    Prior Function Prior Level of Function : Needs assist             Mobility Comments: pt reports some falls always uses rollator, active with personal trainer ADLs Comments: asks for assist at times with showers, reports independent with dressing and self-care, staff assists with medications     Extremity/Trunk Assessment   Upper Extremity Assessment Upper Extremity Assessment: Defer to OT evaluation    Lower Extremity Assessment Lower Extremity Assessment: Generalized weakness (no focal deficits, grossly 4-/5 to MMT)    Cervical / Trunk Assessment Cervical / Trunk Assessment: Kyphotic  Communication   Communication Communication: No apparent difficulties    Cognition Arousal: Alert Behavior During Therapy: Impulsive   PT - Cognitive impairments: No family/caregiver present to determine baseline, Awareness, Memory, Attention, Safety/Judgement                       PT - Cognition Comments: pt with mild deficits in memory, awareness of safety and lines, and needing repeated cues for safety. mild impulsivity without awareness of lines. no family present to confirm baseline Following commands: Intact       Cueing Cueing Techniques: Verbal cues, Gestural cues, Tactile cues     General Comments General comments (skin integrity, edema, etc.): VSS on RA, pt on continuous EEG.    Exercises Other Exercises Other Exercises: 5x sit-stand from recliner. dependent on UE support   Assessment/Plan    PT Assessment Patient needs continued PT services  PT Problem List Decreased strength;Decreased range of motion;Decreased activity tolerance;Decreased balance;Decreased mobility;Decreased safety awareness       PT  Treatment Interventions DME instruction;Gait training;Stair training;Functional mobility training;Therapeutic activities;Balance training;Therapeutic exercise;Patient/family education    PT Goals (Current goals can be found in the Care Plan section)  Acute Rehab PT Goals Patient Stated Goal: return home to [facility] PT Goal Formulation: With patient Time For Goal Achievement: 02/29/24 Potential to Achieve Goals: Good    Frequency Min 2X/week        AM-PAC PT 6 Clicks Mobility  Outcome Measure Help needed turning from your back to your side while in a flat bed without using bedrails?: None Help needed moving from lying on your back to sitting on the side of a flat bed without using bedrails?: A Little Help needed moving to and from a bed to a chair (including a wheelchair)?: A Little Help needed standing up from a chair using your arms (e.g., wheelchair or bedside chair)?: A Little Help needed to walk in hospital room?: Total (<20 ft this session) Help needed climbing 3-5 steps with a railing? : A Lot 6 Click Score: 16    End of Session Equipment Utilized During Treatment: Gait belt Activity Tolerance: Patient tolerated treatment well Patient left: in chair;with call bell/phone within reach;with chair alarm set Nurse Communication: Mobility status PT Visit Diagnosis: Unsteadiness on feet (R26.81);Other abnormalities of gait and mobility (R26.89);Muscle weakness (generalized) (M62.81)    Time: 9152-9089 PT Time Calculation (min) (ACUTE ONLY): 23 min   Charges:   PT Evaluation $PT Eval Moderate Complexity: 1 Mod   PT General Charges $$ ACUTE PT VISIT: 1 Visit  Izetta Call, PT, DPT   Acute Rehabilitation Department Office [phone] Secure Chat Communication Preferred  Izetta JULIANNA Call 02/15/2024, 10:45 AM

## 2024-02-15 NOTE — Progress Notes (Signed)
 STROKE TEAM PROGRESS NOTE   SUBJECTIVE (INTERVAL HISTORY) His wife and RN are at the bedside.  Overall his condition is completely resolved. Per wife and RN, pt lives in [facility] nursing facility. Yesterday he felt he had some worsening left visual field vision difficulty, not able to read on the left and does not feel well, dull brain. He was about to use his VNS but nursing staff requested him to go to ED. In ED, his CT and CTA concerning for R occipital infarct. While on the 6E unit, he had 3 episodes of aphasia, slurry speech, left arm and hand weakness, with one episode with left arm twitching/shaking. Each episode lasted about 5-8 min, all 3 episodes happened within 30 min. Code stroke called, CT stated showed evolving R occipital infarct.   Per wife, pt had previous R temporal surgery and then intractable seizure, and was told to have small stroke on the right brain but unaware of when it happened. Has residue left visual field deficit at baseline. But he is functioning well at [facility].   He followed with Dr Gregg at [facility] for seizure, in 10/2023 his dilantin  increased to 100/200, but his last total dilantin  level on 01/13/24 was 7.7. currently dilantin  free level is pending.    OBJECTIVE Temp:  [97.7 F (36.5 C)-98.6 F (37 C)] 97.8 F (36.6 C) (08/31 0815) Pulse Rate:  [59-63] 63 (08/31 0815) Cardiac Rhythm: Atrial paced (08/31 0722) Resp:  [16-20] 18 (08/31 0815) BP: (128-213)/(54-93) 154/81 (08/31 0815) SpO2:  [94 %-100 %] 94 % (08/31 0815)  Recent Labs  Lab 02/14/24 1000  GLUCAP 90   Recent Labs  Lab 02/14/24 1006 02/14/24 1012 02/14/24 1026 02/15/24 0513  NA 135 136  --  135  K 4.2 4.3  --  4.2  CL 105 103  --  103  CO2 20*  --   --  22  GLUCOSE 95 97  --  90  BUN 13 14  --  10  CREATININE 0.74 0.70  --  0.65  CALCIUM  8.9  --   --  8.6*  MG  --   --  2.0 1.8  PHOS  --   --   --  3.9   Recent Labs  Lab 02/14/24 1006 02/15/24 0513  AST 23 21  ALT 18 16   ALKPHOS 126 116  BILITOT 0.6 0.7  PROT 7.5 6.4*  ALBUMIN 4.0 3.4*   Recent Labs  Lab 02/14/24 1006 02/14/24 1012 02/15/24 0513  WBC 6.1  --  5.4  NEUTROABS 3.2  --   --   HGB 12.9* 12.6* 12.0*  HCT 36.7* 37.0* 33.5*  MCV 93.4  --  91.5  PLT 209  --  186   No results for input(s): CKTOTAL, CKMB, CKMBINDEX, TROPONINI in the last 168 hours. Recent Labs    02/14/24 1006  LABPROT 13.9  INR 1.0   Recent Labs    02/14/24 1135  COLORURINE YELLOW  LABSPEC 1.011  PHURINE 7.0  GLUCOSEU NEGATIVE  HGBUR NEGATIVE  BILIRUBINUR NEGATIVE  KETONESUR NEGATIVE  PROTEINUR NEGATIVE  NITRITE NEGATIVE  LEUKOCYTESUR NEGATIVE       Component Value Date/Time   CHOL 184 02/15/2024 0513   TRIG 59 02/15/2024 0513   HDL 59 02/15/2024 0513   CHOLHDL 3.1 02/15/2024 0513   VLDL 12 02/15/2024 0513   LDLCALC 113 (H) 02/15/2024 0513   Lab Results  Component Value Date   HGBA1C 5.1 02/14/2024      Component  Value Date/Time   LABOPIA NONE DETECTED 02/14/2024 1135   COCAINSCRNUR NONE DETECTED 02/14/2024 1135   LABBENZ NONE DETECTED 02/14/2024 1135   AMPHETMU NONE DETECTED 02/14/2024 1135   THCU NONE DETECTED 02/14/2024 1135   LABBARB NONE DETECTED 02/14/2024 1135    Recent Labs  Lab 02/14/24 1006  ETH <15    I have personally reviewed the radiological images below and agree with the radiology interpretations.  Overnight EEG with video Result Date: 02/15/2024 Shelton Arlin KIDD, MD     02/15/2024  6:57 AM Patient Name: Cameron Potts MRN: [id number] Epilepsy Attending: Arlin KIDD Shelton Referring Physician/Provider: Denna Mimi ORN, NP Duration: 02/14/2024 1754 to 02/15/2024 0645 Patient history:  88 y.o. 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 EEG to evaluate for seizure Level of alertness: Awake, asleep AEDs during EEG study: LEV, LCM, PHT Technical aspects: This EEG study was done with scalp electrodes positioned according to the 10-20 International system of electrode placement. Electrical activity was reviewed with band pass filter of 1-70Hz , sensitivity of 7 uV/mm, display speed of 53mm/sec with a 60Hz  notched filter applied as appropriate. EEG data were recorded continuously and digitally stored.  Video monitoring was available and reviewed as appropriate. Description: The posterior dominant rhythm consists of 7.5 Hz activity of moderate voltage (25-35 uV) seen predominantly in posterior head regions, symmetric and reactive to eye opening and eye closing. Sleep was characterized by vertex waves, sleep spindles (12 to 14 Hz), maximal frontocentral region.  EEG showed continuous polymorphic 3 to 6 Hz theta-delta slowing in right temporal region consistent with breach artifact. Spikes were noted in right anterior temporal region, predominantly during sleep. Intermittent generalized 3-5hz  theta-delta slowing was also noted. Hyperventilation and photic stimulation were not performed.   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 Additionally there is cortical dysfunction arising from right temporal region consistent with underlying craniotomy. Lastly there was mild diffuse encephalopathy. No seizures were seen throughout the recording. Priyanka KIDD Shelton   CT ANGIO HEAD NECK W WO CM (CODE STROKE) Result Date: 02/14/2024 EXAM: CT HEAD WITHOUT CTA HEAD AND NECK WITH AND WITHOUT  02/14/2024 04:59:55 PM TECHNIQUE: CTA of the head and neck was performed with and without the administration of intravenous contrast. Noncontrast CT of the head with reconstructed 2-D images are also provided for review. Multiplanar 2D and/or 3D reformatted images are provided for review. Automated exposure control, iterative reconstruction, and/or weight based adjustment of the mA/kV was utilized to reduce the radiation dose to as low as reasonably achievable. COMPARISON: CT head without contrast 02/14/24. CLINICAL HISTORY: Neuro deficit, acute, stroke suspected. FINDINGS: CT HEAD: BRAIN AND VENTRICLES: No acute intracranial hemorrhage. No mass effect. No midline shift. No extra-axial fluid collection. No evidence of acute infarct. No hydrocephalus. ORBITS: No acute abnormality. SINUSES AND MASTOIDS: No acute abnormality. CTA NECK: AORTIC ARCH AND ARCH VESSELS: Atherosclerotic calcifications are present at the origin of the left subclavian artery without a significant stenosis. Calcifications are present in the distal  arch. No aneurysm or dissection is present. CERVICAL CAROTID ARTERIES: Mural calcifications are present along the right common carotid artery. More complex calcifications are present at the right carotid bifurcation. No significant stenosis of greater than 50% is present relative to the more distal vessels. Atherosclerotic calcifications are present at the left carotid bifurcation without significant stenosis. CERVICAL VERTEBRAL ARTERIES: A high-grade stenosis present in the origin of the right vertebral artery. No additional stenosis are present in either vertebral artery in the neck. LUNGS AND MEDIASTINUM: Unremarkable. SOFT TISSUES: A right thyroid  nodule measures 2.5 cm. Esophagus is distended. No mass lesion is present. Bilateral subcutaneous battery packs and control units are placed over the chest. BONES: Multilevel degenerative changes are present in the cervical spine. Ankylosis is present  across the disc space at C2-3 and C3-4. Slight degenerative anterolisthesis present at C4-C5. Uncovertebral spurring contributes to foraminal narrowing bilaterally at C5-6 and C6-7. CTA HEAD: ANTERIOR CIRCULATION: Right cavernous ICA stent is patent. Atherosclerotic calcifications are present in the cavernous left ICA without significant stenosis relative to the ICA terminus. High-grade stenosis are present in the proximal A1 segments bilaterally. Migraine stenosis present in the distal right M1 segment. Segmental narrowing is present throughout the ACA and MCA branch vessels. POSTERIOR CIRCULATION: High-grade stenosis are present in the proximal left P1 and P2 segments. A high-grade stenosis is present in the more distal right P2 segment. Marked attenuation of PCA branch vessels is noted, right greater than left. OTHER: No dural venous sinus thrombosis on this non-dedicated study. IMPRESSION: 1. Migraine, near occlusive, stenosis of the the mid right p2 segment corresponds with the right occipital infarct. 2. High-grade stenosis in the origin of the right vertebral artery. 3. High-grade stenosis in the proximal A1 segments bilaterally, distal right M1 segment and moderate egmental stenoses throughout the ACA and MCA branch vessels. 4. Moderate proximal left P1 and P2 segments. Marked attenuation of PCA branch vessels, right greater than left. Electronically signed by: Lonni Necessary MD 02/14/2024 05:11 PM EDT RP Workstation: [id number]   CT HEAD CODE STROKE WO CONTRAST Result Date: 02/14/2024 EXAM: CT HEAD WITHOUT 02/14/2024 04:51:03 PM TECHNIQUE: CT of the head was performed without the administration of intravenous contrast. Automated exposure control, iterative reconstruction, and/or weight based adjustment of the mA/kV was utilized to reduce the radiation dose to as low as reasonably achievable. COMPARISON: CT head without contrast 02/14/2024 at 10:35 AM. CLINICAL HISTORY: Neuro deficit, acute, stroke  suspected. FINDINGS: BRAIN AND VENTRICLES: Evolving inferior right occipital lobe infarct is noted. Right perianal craniotomy and temporal lobe resection is again noted. A remote infarct in the high medial right frontal lobe is stable. Atrophy and white matter disease, mildly advanced for age is otherwise stable. ORBITS: Lens replacement. SINUSES AND MASTOIDS: Moderate mucosal thickening is present in the maxillary sinuses bilaterally. Bilateral maxillary antrostomies are noted. Minimal fluid is present in the inferior left mastoid air cells. No obstructing nasopharyngeal lesion is present. SOFT TISSUES AND SKULL: No acute skull fracture. No acute soft tissue abnormality. VASCULATURE: Right cavernous internal carotid artery stent is in place. Atherosclerotic changes are present in the cavernous left ICA. IMPRESSION: 1. Evolving inferior right occipital lobe infarct. 2. Stable remote infarct in the high medial right frontal lobe. 3. Right perianal craniotomy and temporal lobe resection. 4. Right cavernous internal carotid artery stent in place. Atherosclerotic changes in the cavernous left ICA. 5. Mildly advanced atrophy and white matter disease, stable. Electronically signed by: Lonni Necessary MD 02/14/2024 05:01 PM EDT RP Workstation: [id number]  CT HEAD WO CONTRAST Result Date: 02/14/2024 CLINICAL DATA:  88 year old male with left eye vision changes and abnormal gait since yesterday morning. EXAM: CT HEAD WITHOUT CONTRAST TECHNIQUE: Contiguous axial images were obtained from the base of the skull through the vertex without intravenous contrast. RADIATION DOSE REDUCTION: This exam was performed according to the departmental dose-optimization program which includes automated exposure control, adjustment of the mA and/or kV according to patient size and/or use of iterative reconstruction technique. COMPARISON:  Head CT 08/05/2023. FINDINGS: Brain: Chronic right middle cranial fossa resection cavity, subtotal  absence of the right temporal lobe. Stable cerebral volume from earlier this year. New confluent hypodensity in the right occipital pole since February (coronal image 15 and series 5, image 26), along the inferior calcarine sulcus. No associated hemorrhage or mass effect. See sagittal image 26. No other acute cortically based infarct identified. Stable encephalomalacia in the distal right ACA territory, right cingulate gyrus (coronal image 43). No superimposed No midline shift, ventriculomegaly, mass effect, acute intracranial hemorrhage. Vascular: Calcified atherosclerosis at the skull base. No suspicious intracranial vascular hyperdensity. Skull: Chronic right frontotemporal craniotomy. No acute osseous abnormality identified. Sinuses/Orbits: Chronic paranasal sinus disease with up to moderate mucoperiosteal thickening bilaterally has not significantly changed. Tympanic cavities and mastoids remain well aerated. Other: No acute orbit or scalp soft tissue finding. Chronic postoperative changes to both globes. IMPRESSION: 1. Positive for small acute or subacute Right PCA territory infarct affecting the inferior right occipital pole. Correlate for Left visual field deficits. No hemorrhagic transformation or mass effect. 2. Otherwise stable including chronic distal right ACA infarct, chronic right middle cranial fossa resection. Chronic paranasal sinus disease. Electronically Signed   By: VEAR Hurst M.D.   On: 02/14/2024 10:48   CUP PACEART REMOTE DEVICE CHECK Result Date: 01/29/2024 ICD Monthly battery check.  Estimated 4 months. Normal device function. No new alerts. 6 AT/AF episodes, all on 01/13/24 between 14:48 and 16:56, longest 54 min 15 sec, burden 0.3% since 12/29/23, not on OAC due to contraindication with low occurrence per Epic. Follow up as scheduled monthly. MC, CVRS ICD Monthly battery check.  Estimated 4 months. Normal device function. No new alerts. 6 AT/AF episodes, all on 01/13/24 between 14:48 and  16:56, longest 54 min 15 sec, V rates > 100 bpm ~ 35%, burden 0.3% since 12/29/23, not on OAC due to contraindication with low occurrence per Epic. Follow up as scheduled monthly. MC, CVRS    PHYSICAL EXAM  Temp:  [97.7 F (36.5 C)-98.6 F (37 C)] 97.8 F (36.6 C) (08/31 0815) Pulse Rate:  [59-63] 63 (08/31 0815) Resp:  [16-20] 18 (08/31 0815) BP: (128-213)/(54-93) 154/81 (08/31 0815) SpO2:  [94 %-100 %] 94 % (08/31 0815)  General - Well nourished, well developed, in no apparent distress.  Ophthalmologic - fundi not visualized due to noncooperation.  Cardiovascular - Regular rhythm and rate.  Mental Status -  Level of arousal and orientation to time, place, and person were intact. Language including expression, naming, repetition, comprehension was assessed and found intact. Attention span and concentration were normal. Fund of Knowledge was assessed and was intact.  Cranial Nerves II - XII - II - Visual field intact OD, on the left, exam not very consistent but he had left upper quadrantanopia vs. Simultagnosia. Left lower quadrant decreased visual acuity.  Potts, IV, VI - Extraocular movements intact. V - Facial sensation intact bilaterally. VII - Facial movement intact bilaterally. VIII - Hearing & vestibular intact bilaterally. X - Palate elevates symmetrically.  XI - Chin turning & shoulder shrug intact bilaterally. XII - Tongue protrusion intact.  Motor Strength - The patient's strength was normal in all extremities and pronator drift was absent.  Bulk was normal and fasciculations were absent.   Motor Tone - Muscle tone was assessed at the neck and appendages and was normal.  Reflexes - The patient's reflexes were symmetrical in all extremities and he had no pathological reflexes.  Sensory - Light touch, temperature/pinprick were assessed and were symmetrical.    Coordination - The patient had normal movements in the hands with no ataxia or dysmetria.  Tremor was  absent.  Gait and Station - deferred.   ASSESSMENT/PLAN Mr. Cameron Potts is a 88 y.o. male with history of epilepsy s/p VNS on AEDs, afib not on AC now, hx of cardiac arrest s/p pacemaker, orthostatic hypotension, gait disorder, CNS mass s/p R temporal resection, stroke with residue left [facility] admitted for episode of worsening left HH, confusion. While on the floor, he had 3 episodes of aphasia, slurry speech, left sided weakness. No TNK given due to outside window.    Possible stroke: possible right PCA infarct secondary to R PCA large vessel disease source CT R PCA small acute or subacute infarct affecting inferior R occipital pole, chronic R ACA infarct, and R middle cranial fossa resection CT repeat Evolving inferior right occipital lobe infarct CTA head and neck near occlusive, stenosis of the the mid right p2 segment corresponds with the right occipital infarct. High-grade stenosis in the origin of the right [place]. High-grade stenosis in the proximal A1 segments bilaterally, distal right M1 segment and moderate egmental stenoses throughout the ACA and MCA branch vessels. Moderate proximal left P1 and P2 segments. Marked attenuation of PCA branch vessels, right greater than left. MRI  not able to perform due to VNS 2D Echo  pending LDL 113 HgbA1c 5.1 UDS neg SCDs for VTE prophylaxis No antithrombotic prior to admission, now on aspirin  81 mg daily and clopidogrel  75 mg daily DAPT for 3 months and then ASA alone Patient counseled to be compliant with his antithrombotic medications Ongoing aggressive stroke risk factor management Therapy recommendations:  HH PT and OT Disposition:  pending  Epilepsy  Since R temporal mass resection S/p VNS On home dilantin  100/200, keppra  XR 2250, vimpat  200 bid Last dilantin  level 7.7 on 01/13/24 Yesterday he felt he had some worsening of the baseline left visual field vision difficulty, not able to read on the left and does not feel well, dull  brain. While on the 6E unit, he had 3 episodes of aphasia, slurry speech, left arm and hand weakness, with one episode with left arm twitching/shaking. Each episode lasted about 5-8 min, all 3 episodes happened within 30 min. Post episodes, he was back to baseline. This morning, he is at baseline LTM EEG overnight showed R temporal spike with breach artifact Now on dilantin  100/200, keppra  1500 bid and vimpat  200 bid Dilantin  free level pending Will check dilantin  total level today and in am - pending Will adjust dilantin  level accordingly.   Afib On flecainide  Was on warfarin in the past but d/c due to frequent falls and epilepsy Now on ASA and plavix  for large vessel disease with stroke  Hypertension Hx of orthostatic hypotension Stable on the high end Long term BP goal normotensive  Hyperlipidemia Home meds:  none  LDL 113, goal < 70 Now on lipitor 20 No high intensity statin due to advanced age Continue statin at  discharge  Other Stroke Risk Factors Advanced age Hx of cardiac arrest s/p pacemaker  Other Active Problems Gait disorder R temporal mass s/p resection  Hospital day # 1  I discussed with Dr. Leotis. I spent extensive total face-to-face time with the patient and hsi wife, reviewing test results, images and medication, and discussing the diagnosis, treatment plan, dilantin  monitoring and dosage and stroke management. This patient's care requiresreview of multiple databases, neurological assessment, discussion with family, other specialists and medical decision making of high complexity.    Ary Cummins, MD PhD Stroke Neurology 02/15/2024 11:38 AM    To contact Stroke Continuity provider, please refer to [email or web address]. After hours, contact General Neurology

## 2024-02-15 NOTE — Evaluation (Signed)
 Occupational Therapy Evaluation Patient Details Name: Cameron Potts MRN: [id number] DOB: Jul 19, 1935 Today's Date: 02/15/2024   History of Present Illness   Pt is an 88 y.o. male presenting 8/30 from [facility] with worsening L visual field deficits. CTH Positive for small acute or subacute Right PCA territory infarct affecting the inferior right occipital pole. Correlate for Left visual field deficits. EEG consistent w/ known hx of focal epilepsy arising from right anterior temporal region; cortical dysfunction arising from right temporal region consistent with underlying craniotomy; mild diffuse encephalopathy. PMH: seizure disorder, CVA, HLD, HTN, atrial fibrillation, glaucoma, status post pacemaker, vagal nerve stimulator, gait abnormality, orthostatic hypotension, history of cardiac arrest     Clinical Impressions PTA, pt lived at ALF and was independent for BADL and received assist with IADL. Upon eval, pt presents with decreased cognition, attention, organization, and safety. Pt needing up to set-up for UB ADL and CGA for LB ADL. Pt to continue to benefit from acute OT services and follow up OT at [facility] at discharge.     If plan is discharge home, recommend the following:   A little help with walking and/or transfers;A little help with bathing/dressing/bathroom;Assistance with cooking/housework;Direct supervision/assist for financial management;Direct supervision/assist for medications management;Assist for transportation;Help with stairs or ramp for entrance     Functional Status Assessment   Patient has had a recent decline in their functional status and demonstrates the ability to make significant improvements in function in a reasonable and predictable amount of time.     Equipment Recommendations   None recommended by OT     Recommendations for Other Services         Precautions/Restrictions   Precautions Precautions: Fall;Other (comment)  (seizure) Recall of Precautions/Restrictions: Intact Precaution/Restrictions Comments: continuous EEG Restrictions Weight Bearing Restrictions Per Provider Order: No     Mobility Bed Mobility Overal bed mobility: Needs Assistance Bed Mobility: Supine to Sit     Supine to sit: Supervision, Used rails     General bed mobility comments: no assist other than line management    Transfers Overall transfer level: Needs assistance Equipment used: Rolling walker (2 wheels) Transfers: Sit to/from Stand Sit to Stand: Contact guard assist           General transfer comment: CGA to rise from EOB, cues for use of UE on armrests. dependent on UE support to rise from recliner      Balance Overall balance assessment: Needs assistance, Mild deficits observed, not formally tested Sitting-balance support: No upper extremity supported, Feet supported Sitting balance-Leahy Scale: Good     Standing balance support: Bilateral upper extremity supported Standing balance-Leahy Scale: Fair Standing balance comment: dependent on BUE support for dynamic balance                           ADL either performed or assessed with clinical judgement   ADL Overall ADL's : Needs assistance/impaired Eating/Feeding: Modified independent;Sitting   Grooming: Contact guard assist;Standing   Upper Body Bathing: Set up;Sitting   Lower Body Bathing: Set up;Contact guard assist;Sit to/from stand   Upper Body Dressing : Set up;Sitting   Lower Body Dressing: Contact guard assist;Sit to/from stand   Toilet Transfer: Contact guard assist;Stand-pivot;Rolling walker (2 wheels);Ambulation           Functional mobility during ADLs: Contact guard assist;Rolling walker (2 wheels)       Vision Baseline Vision/History:  (visual field cut and glaucoma at baseline; pt reports mildly  incr visual field cut from what he can tell) Additional Comments: pt aware of visual compensatory techniques for  field cut and needs to use with any object L of his midline     Perception         Praxis         Pertinent Vitals/Pain Pain Assessment Pain Assessment: No/denies pain     Extremity/Trunk Assessment Upper Extremity Assessment Upper Extremity Assessment: Generalized weakness   Lower Extremity Assessment Lower Extremity Assessment: Defer to PT evaluation   Cervical / Trunk Assessment Cervical / Trunk Assessment: Kyphotic   Communication Communication Communication: No apparent difficulties   Cognition Arousal: Alert Behavior During Therapy: Impulsive Cognition: Cognition impaired, No family/caregiver present to determine baseline (hx of seizures and lives at ALF with assist for all IADL, thus unsure cognitive baseline)   Orientation impairments: Situation ([place]) Awareness: Intellectual awareness intact, Online awareness impaired (online awareness of visual deficit; intellectual of cognitive deficits) Memory impairment (select all impairments): Short-term memory, Working memory Attention impairment (select first level of impairment): Selective attention, Sustained attention (internally distracted) Executive functioning impairment (select all impairments): Problem solving, Organization, Reasoning OT - Cognition Comments: pt internally distratced during session. aware he has had a seizure, unaware of new CVA. impulsive throughout session                 Following commands: Intact       Cueing  General Comments   Cueing Techniques: Verbal cues;Gestural cues;Tactile cues  cont EEG; no noted seizures during session   Exercises Exercises: Other exercises Other Exercises Other Exercises: 5x sit-stand from recliner. dependent on UE support   Shoulder Instructions      Home Living Family/patient expects to be discharged to:: Assisted living                             Home Equipment: Rollator (4 wheels)   Additional Comments: walk in  shower, seat, bars, staff helps with meds      Prior Functioning/Environment Prior Level of Function : Needs assist             Mobility Comments: pt reports some falls always uses rollator, active with personal trainer ADLs Comments: asks for assist at times with showers, reports independent with dressing and self-care, staff assists with medications    OT Problem List: Decreased strength;Decreased activity tolerance;Impaired balance (sitting and/or standing);Impaired vision/perception;Decreased cognition;Decreased safety awareness;Decreased knowledge of use of DME or AE   OT Treatment/Interventions: Self-care/ADL training;Therapeutic exercise;DME and/or AE instruction;Therapeutic activities;Cognitive remediation/compensation;Visual/perceptual remediation/compensation;Patient/family education;Balance training      OT Goals(Current goals can be found in the care plan section)   Acute Rehab OT Goals Patient Stated Goal: go home OT Goal Formulation: With patient Time For Goal Achievement: 02/29/24 Potential to Achieve Goals: Good   OT Frequency:  Min 2X/week    Co-evaluation              AM-PAC OT 6 Clicks Daily Activity     Outcome Measure Help from another person eating meals?: A Little Help from another person taking care of personal grooming?: A Little Help from another person toileting, which includes using toliet, bedpan, or urinal?: A Little Help from another person bathing (including washing, rinsing, drying)?: A Little Help from another person to put on and taking off regular upper body clothing?: A Little Help from another person to put on and taking off regular lower body clothing?: A Little 6  Click Score: 18   End of Session Equipment Utilized During Treatment: Gait belt;Rolling walker (2 wheels) Nurse Communication: Mobility status  Activity Tolerance: Patient tolerated treatment well Patient left: with call bell/phone within reach;in chair;with chair  alarm set  OT Visit Diagnosis: Unsteadiness on feet (R26.81);Muscle weakness (generalized) (M62.81);Other symptoms and signs involving cognitive function;Low vision, both eyes (H54.2)                Time: 9152-9089 OT Time Calculation (min): 23 min Charges:  OT General Charges $OT Visit: 1 Visit OT Evaluation $OT Eval Low Complexity: 1 Low  Cameron Potts, OTR/L [facility] Acute Rehabilitation Office: [phone]   Cameron Potts 02/15/2024, 11:03 AM

## 2024-02-15 NOTE — Progress Notes (Signed)
 PROGRESS NOTE    Cameron Potts  FMW:[id number] DOB: 11-17-1935 DOA: 02/14/2024 PCP: Charlanne Fredia CROME, MD   Brief Narrative:  This 88 yrs old Male with PMH significant for seizure disorder, history of CVA in the past, hyperlipidemia, hypertension, atrial fibrillation, glaucoma, status post pacemaker, vagal nerve stimulator, gait abnormality, orthostatic hypotension, history of cardiac arrest in the past, lives at [facility], patient usually ambulates with the help of walker, patient does have baseline left-sided peripheral vision defects secondary to left-sided optic nerve damage from intractable seizures in the past .  Patient was noted to have worsening peripheral visual field loss on the left side in the morning today at nursing home.  Patient describes to the nurse that something is wrong with his vision which got worse.  Patient was brought in the ED and  stroke code was called. CT head:  Positive for small acute or subacute Right PCA territory infarct affecting the inferior right occipital pole. Correlate for Left visual field deficits. No hemorrhagic transformation or mass effect. Otherwise stable including chronic distal right ACA infarct, chronic right middle cranial fossa resection.  Patient was admitted for further evaluation.  Assessment & Plan:   Principal Problem:   Acute CVA (cerebrovascular accident) (HCC) Active Problems:   Complex partial seizure (HCC)   Chronic atrial fibrillation   Parkinsonian syndrome associated with symptomatic orthostatic hypotension (HCC)   Adjustment disorder with anxious mood   Hyperlipidemia   ICD (implantable cardioverter-defibrillator) in place   Status post VNS (vagus nerve stimulator) placement   Neuropathy, peripheral   Benign prostatic hyperplasia with urinary frequency   Acute/subacute CVA: Patient presented with worsening left-sided peripheral vision field. CT head > small acute or subacute RCA territory infarct  involving inferior right occipital lobe. Continue CVA workup. Frequent neurochecks every 4 hours Allow permissive hypertension for 48 hours. Obtain 2D echocardiogram, carotid duplex. MRI cannot be obtained since patient has pacemaker and vagus nerve stimulator. Patient passed bedside swallow evaluation and started on soft diet. Obtain lipid profile, TSH, hemoglobin A1c. Neurology is consulted. EDP discussed the case with Dr. Matthews , recommended CVA workup. Continue aspirin  325 mg daily, Plavix  75 mg p.o. daily PT and OT evaluation and speech evaluation. Stroke team continue to follow.   Seizure disorder: Patient takes 3 different antiseizure medications. Resume Dilantin , Keppra  and Vimpat . Patient had an episode of worsening peripheral vision loss. Presentation is consistent with breakthrough seizures. Continue long-term EEG, seizure precautions.  Keppra  dose increased as per neurology.   Chronic atrial fibrillation: Heart rate is controlled, Continue flecainide , patient is not on anticoagulation.   BPH: Continue Proscar  and  Flomax .   Permanent pacemaker: Stable, No concerns,  follow-up outpatient   Hyperlipidemia Continue Lipitor , LDL 113 above goal.   Adjustment disorder with anxious mood. Continue alprazolam .   Parkinson's disease: Not on anti-Parkinson's medication. Neurology follow-up.     DVT prophylaxis: SCDs Code Status:DNR Family Communication: Wife at bed side Disposition Plan:    Status is: Inpatient Remains inpatient appropriate because: Admitted for CVA/TIA  Consultants:  Neurology  Procedures: CT head/ EEG  Antimicrobials: None  Subjective: Patient was seen and examined at bedside.  Overnight events noted. Patient reports he is feeling much better,  it seems like he had a seizure episode,  now he is feeling very much improved and he wants to be discharged home.   Objective: Vitals:   02/14/24 2006 02/15/24 0017 02/15/24 0438 02/15/24  0815  BP: (!) 166/54 (!) 128/54 ROLLEN)  153/62 (!) 154/81  Pulse: 62 (!) 59 (!) 59 63  Resp: 18 20 16 18   Temp: 98 F (36.7 C) 98.6 F (37 C) 98 F (36.7 C) 97.8 F (36.6 C)  TempSrc: Oral Oral Oral Oral  SpO2: 98% 94% 96% 94%  Weight:      Height:        Intake/Output Summary (Last 24 hours) at 02/15/2024 1148 Last data filed at 02/15/2024 9047 Gross per 24 hour  Intake 309 ml  Output 550 ml  Net -241 ml   Filed Weights   02/14/24 1118  Weight: 77.1 kg    Examination:  General exam: Appears calm and comfortable, not in any acute distress. Respiratory system: Clear to auscultation. Respiratory effort normal. RR 18 Cardiovascular system: S1 & S2 heard, RRR. No JVD, murmurs, rubs, gallops or clicks.  Gastrointestinal system: Abdomen is non distended, soft and non tender. Normal bowel sounds heard. Central nervous system: Alert and oriented x 3. Left Sided peripheral vision field defect Extremities: No edema, no cyanosis, no clubbing. Skin: No rashes, lesions or ulcers Psychiatry: Judgement and insight appear normal. Mood & affect appropriate.     Data Reviewed: I have personally reviewed following labs and imaging studies  CBC: Recent Labs  Lab 02/14/24 1006 02/14/24 1012 02/15/24 0513  WBC 6.1  --  5.4  NEUTROABS 3.2  --   --   HGB 12.9* 12.6* 12.0*  HCT 36.7* 37.0* 33.5*  MCV 93.4  --  91.5  PLT 209  --  186   Basic Metabolic Panel: Recent Labs  Lab 02/14/24 1006 02/14/24 1012 02/14/24 1026 02/15/24 0513  NA 135 136  --  135  K 4.2 4.3  --  4.2  CL 105 103  --  103  CO2 20*  --   --  22  GLUCOSE 95 97  --  90  BUN 13 14  --  10  CREATININE 0.74 0.70  --  0.65  CALCIUM  8.9  --   --  8.6*  MG  --   --  2.0 1.8  PHOS  --   --   --  3.9   GFR: Estimated Creatinine Clearance: 64.1 mL/min (by C-G formula based on SCr of 0.65 mg/dL). Liver Function Tests: Recent Labs  Lab 02/14/24 1006 02/15/24 0513  AST 23 21  ALT 18 16  ALKPHOS 126 116  BILITOT  0.6 0.7  PROT 7.5 6.4*  ALBUMIN 4.0 3.4*   No results for input(s): LIPASE, AMYLASE in the last 168 hours. No results for input(s): AMMONIA in the last 168 hours. Coagulation Profile: Recent Labs  Lab 02/14/24 1006  INR 1.0   Cardiac Enzymes: No results for input(s): CKTOTAL, CKMB, CKMBINDEX, TROPONINI in the last 168 hours. BNP (last 3 results) No results for input(s): PROBNP in the last 8760 hours. HbA1C: Recent Labs    02/14/24 1006  HGBA1C 5.1   CBG: Recent Labs  Lab 02/14/24 1000  GLUCAP 90   Lipid Profile: Recent Labs    02/15/24 0513  CHOL 184  HDL 59  LDLCALC 113*  TRIG 59  CHOLHDL 3.1   Thyroid  Function Tests: No results for input(s): TSH, T4TOTAL, FREET4, T3FREE, THYROIDAB in the last 72 hours. Anemia Panel: No results for input(s): VITAMINB12, FOLATE, FERRITIN, TIBC, IRON, RETICCTPCT in the last 72 hours. Sepsis Labs: No results for input(s): PROCALCITON, LATICACIDVEN in the last 168 hours.  No results found for this or any previous visit (from the past 240  hours).   Radiology Studies: Overnight EEG with video Result Date: 02/15/2024 Shelton Arlin KIDD, MD     02/15/2024  6:57 AM Patient Name: Latavious Bitter Potts MRN: [id number] Epilepsy Attending: Arlin KIDD Shelton Referring Physician/Provider: Denna Mimi ORN, NP Duration: 02/14/2024 1754 to 02/15/2024 0645 Patient history:  88 y.o. 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 EEG to evaluate for seizure Level of alertness: Awake, asleep AEDs during EEG study: LEV, LCM, PHT  Technical aspects: This EEG study was done with scalp electrodes positioned according to the 10-20 International system of electrode placement. Electrical activity was reviewed with band pass filter of 1-70Hz , sensitivity of 7 uV/mm, display speed of 67mm/sec with a 60Hz  notched filter applied as appropriate. EEG data were recorded continuously and digitally stored.  Video monitoring was available and reviewed as appropriate. Description: The posterior dominant rhythm consists of 7.5 Hz activity of moderate voltage (25-35 uV) seen predominantly in posterior head regions, symmetric and reactive to eye opening and eye closing. Sleep was characterized by vertex waves, sleep spindles (12 to 14 Hz), maximal frontocentral region.  EEG showed continuous polymorphic 3 to 6 Hz theta-delta slowing in right temporal region consistent with breach artifact. Spikes were noted in right anterior temporal region, predominantly during sleep. Intermittent generalized 3-5hz  theta-delta slowing was also noted. Hyperventilation and photic stimulation were not performed.   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 Additionally there is cortical dysfunction arising from right temporal region consistent with underlying craniotomy. Lastly there was mild diffuse encephalopathy. No seizures were seen throughout the recording. Priyanka KIDD Shelton   CT ANGIO HEAD NECK W WO CM (CODE STROKE) Result Date: 02/14/2024 EXAM: CT HEAD WITHOUT CTA HEAD AND NECK WITH AND WITHOUT 02/14/2024 04:59:55 PM TECHNIQUE: CTA of the head and neck was performed with and without the administration of intravenous contrast. Noncontrast CT of the head with reconstructed 2-D images are also provided for review. Multiplanar 2D and/or 3D reformatted images are provided  for review. Automated exposure control, iterative reconstruction, and/or weight based adjustment of the mA/kV was utilized to reduce the radiation dose to as low as reasonably achievable. COMPARISON: CT head without contrast 02/14/24. CLINICAL HISTORY: Neuro deficit, acute, stroke suspected. FINDINGS: CT HEAD: BRAIN AND VENTRICLES: No acute intracranial hemorrhage. No mass effect. No midline shift. No extra-axial fluid collection. No evidence of acute infarct. No hydrocephalus. ORBITS: No acute abnormality. SINUSES AND MASTOIDS: No acute abnormality. CTA NECK: AORTIC ARCH AND ARCH VESSELS: Atherosclerotic calcifications are present at the origin of the left subclavian artery without a significant stenosis. Calcifications are present in the distal arch. No aneurysm or dissection is present. CERVICAL CAROTID ARTERIES: Mural calcifications are present along the right common carotid artery. More complex calcifications are present at the right carotid bifurcation. No significant stenosis of greater than 50% is present relative to the more distal vessels. Atherosclerotic calcifications are present at the left carotid bifurcation without significant stenosis. CERVICAL VERTEBRAL ARTERIES: A high-grade stenosis present in the origin of the  right vertebral artery. No additional stenosis are present in either vertebral artery in the neck. LUNGS AND MEDIASTINUM: Unremarkable. SOFT TISSUES: A right thyroid  nodule measures 2.5 cm. Esophagus is distended. No mass lesion is present. Bilateral subcutaneous battery packs and control units are placed over the chest. BONES: Multilevel degenerative changes are present in the cervical spine. Ankylosis is present across the disc space at C2-3 and C3-4. Slight degenerative anterolisthesis present at C4-C5. Uncovertebral spurring contributes to foraminal narrowing bilaterally at C5-6 and C6-7. CTA HEAD: ANTERIOR CIRCULATION: Right cavernous ICA stent is patent. Atherosclerotic calcifications  are present in the cavernous left ICA without significant stenosis relative to the ICA terminus. High-grade stenosis are present in the proximal A1 segments bilaterally. Migraine stenosis present in the distal right M1 segment. Segmental narrowing is present throughout the ACA and MCA branch vessels. POSTERIOR CIRCULATION: High-grade stenosis are present in the proximal left P1 and P2 segments. A high-grade stenosis is present in the more distal right P2 segment. Marked attenuation of PCA branch vessels is noted, right greater than left. OTHER: No dural venous sinus thrombosis on this non-dedicated study. IMPRESSION: 1. Migraine, near occlusive, stenosis of the the mid right p2 segment corresponds with the right occipital infarct. 2. High-grade stenosis in the origin of the right vertebral artery. 3. High-grade stenosis in the proximal A1 segments bilaterally, distal right M1 segment and moderate egmental stenoses throughout the ACA and MCA branch vessels. 4. Moderate proximal left P1 and P2 segments. Marked attenuation of PCA branch vessels, right greater than left. Electronically signed by: Lonni Necessary MD 02/14/2024 05:11 PM EDT RP Workstation: [id number]   CT HEAD CODE STROKE WO CONTRAST Result Date: 02/14/2024 EXAM: CT HEAD WITHOUT 02/14/2024 04:51:03 PM TECHNIQUE: CT of the head was performed without the administration of intravenous contrast. Automated exposure control, iterative reconstruction, and/or weight based adjustment of the mA/kV was utilized to reduce the radiation dose to as low as reasonably achievable. COMPARISON: CT head without contrast 02/14/2024 at 10:35 AM. CLINICAL HISTORY: Neuro deficit, acute, stroke suspected. FINDINGS: BRAIN AND VENTRICLES: Evolving inferior right occipital lobe infarct is noted. Right perianal craniotomy and temporal lobe resection is again noted. A remote infarct in the high medial right frontal lobe is stable. Atrophy and white matter disease, mildly  advanced for age is otherwise stable. ORBITS: Lens replacement. SINUSES AND MASTOIDS: Moderate mucosal thickening is present in the maxillary sinuses bilaterally. Bilateral maxillary antrostomies are noted. Minimal fluid is present in the inferior left mastoid air cells. No obstructing nasopharyngeal lesion is present. SOFT TISSUES AND SKULL: No acute skull fracture. No acute soft tissue abnormality. VASCULATURE: Right cavernous internal carotid artery stent is in place. Atherosclerotic changes are present in the cavernous left ICA. IMPRESSION: 1. Evolving inferior right occipital lobe infarct. 2. Stable remote infarct in the high medial right frontal lobe. 3. Right perianal craniotomy and temporal lobe resection. 4. Right cavernous internal carotid artery stent in place. Atherosclerotic changes in the cavernous left ICA. 5. Mildly advanced atrophy and white matter disease, stable. Electronically signed by: Lonni Necessary MD 02/14/2024 05:01 PM EDT RP Workstation: [id number]   CT HEAD WO CONTRAST Result Date: 02/14/2024 CLINICAL DATA:  88 year old male with left eye vision changes and abnormal gait since yesterday morning. EXAM: CT HEAD WITHOUT CONTRAST TECHNIQUE: Contiguous axial images were obtained from the base of the skull through the vertex without intravenous contrast. RADIATION DOSE REDUCTION: This exam was performed according to the departmental dose-optimization program which includes automated exposure control, adjustment of the  mA and/or kV according to patient size and/or use of iterative reconstruction technique. COMPARISON:  Head CT 08/05/2023. FINDINGS: Brain: Chronic right middle cranial fossa resection cavity, subtotal absence of the right temporal lobe. Stable cerebral volume from earlier this year. New confluent hypodensity in the right occipital pole since February (coronal image 15 and series 5, image 26), along the inferior calcarine sulcus. No associated hemorrhage or mass effect. See  sagittal image 26. No other acute cortically based infarct identified. Stable encephalomalacia in the distal right ACA territory, right cingulate gyrus (coronal image 43). No superimposed No midline shift, ventriculomegaly, mass effect, acute intracranial hemorrhage. Vascular: Calcified atherosclerosis at the skull base. No suspicious intracranial vascular hyperdensity. Skull: Chronic right frontotemporal craniotomy. No acute osseous abnormality identified. Sinuses/Orbits: Chronic paranasal sinus disease with up to moderate mucoperiosteal thickening bilaterally has not significantly changed. Tympanic cavities and mastoids remain well aerated. Other: No acute orbit or scalp soft tissue finding. Chronic postoperative changes to both globes. IMPRESSION: 1. Positive for small acute or subacute Right PCA territory infarct affecting the inferior right occipital pole. Correlate for Left visual field deficits. No hemorrhagic transformation or mass effect. 2. Otherwise stable including chronic distal right ACA infarct, chronic right middle cranial fossa resection. Chronic paranasal sinus disease. Electronically Signed   By: VEAR Hurst M.D.   On: 02/14/2024 10:48   Scheduled Meds:  aspirin  EC  81 mg Oral Daily   atorvastatin   20 mg Oral Daily   clopidogrel   75 mg Oral Daily   finasteride   5 mg Oral q morning   flecainide   50 mg Oral BID   lacosamide   200 mg Oral BID   levETIRAcetam   1,500 mg Intravenous Q12H   [START ON 02/16/2024] phenytoin   100 mg Oral Daily   And   phenytoin   200 mg Oral QHS   sodium chloride   2 g Oral TID   tamsulosin   0.4 mg Oral q AM   Continuous Infusions:  sodium chloride  40 mL/hr at 02/14/24 1513     LOS: 1 day    Time spent: 50 mins    Darcel Dawley, MD [facility]   If 7PM-7AM, please contact night-coverage

## 2024-02-15 NOTE — Progress Notes (Signed)
 LTM maint complete - no skin breakdown under:  Fp2, F7

## 2024-02-15 NOTE — Procedures (Signed)
 Patient Name: Cameron Potts  MRN: [id number]  Epilepsy Attending: Arlin MALVA Krebs  Referring Physician/Provider: Denna Mimi ORN, NP  Duration: 02/14/2024 1754 to 02/15/2024 1754  Patient history:  88 y.o. 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 EEG to evaluate for seizure  Level of alertness: Awake, asleep  AEDs during EEG study: LEV, LCM, PHT  Technical aspects: This EEG study was done with scalp electrodes positioned according to the 10-20 International system of electrode placement. Electrical activity was reviewed with band pass filter of 1-70Hz , sensitivity of 7 uV/mm, display speed of 63mm/sec with a 60Hz  notched filter applied as appropriate. EEG data were recorded continuously and digitally stored.  Video monitoring was available and reviewed as appropriate.  Description: The posterior dominant rhythm consists of 7.5 Hz activity of moderate voltage (25-35 uV) seen predominantly in posterior head regions, symmetric and reactive to eye opening and eye closing. Sleep was characterized by vertex waves, sleep spindles (12 to 14 Hz), maximal frontocentral region.  EEG showed continuous polymorphic 3 to 6 Hz theta-delta slowing in right temporal region consistent with breach artifact. Spikes were noted in right anterior temporal region, predominantly during sleep. Intermittent generalized 3-5hz  theta-delta slowing was also noted. Hyperventilation and photic stimulation were not performed.     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 Additionally there is cortical dysfunction arising from right temporal region consistent with underlying craniotomy. Lastly there was  mild diffuse encephalopathy. No seizures were seen throughout the recording.  Cameron Potts

## 2024-02-16 ENCOUNTER — Encounter (HOSPITAL_COMMUNITY): Payer: Self-pay | Admitting: Family Medicine

## 2024-02-16 ENCOUNTER — Inpatient Hospital Stay (HOSPITAL_COMMUNITY)

## 2024-02-16 DIAGNOSIS — I63531 Cerebral infarction due to unspecified occlusion or stenosis of right posterior cerebral artery: Secondary | ICD-10-CM | POA: Diagnosis not present

## 2024-02-16 DIAGNOSIS — T426X6A Underdosing of other antiepileptic and sedative-hypnotic drugs, initial encounter: Secondary | ICD-10-CM | POA: Diagnosis not present

## 2024-02-16 DIAGNOSIS — I639 Cerebral infarction, unspecified: Secondary | ICD-10-CM | POA: Diagnosis not present

## 2024-02-16 DIAGNOSIS — I6389 Other cerebral infarction: Secondary | ICD-10-CM | POA: Diagnosis not present

## 2024-02-16 DIAGNOSIS — E785 Hyperlipidemia, unspecified: Secondary | ICD-10-CM | POA: Diagnosis not present

## 2024-02-16 DIAGNOSIS — G40909 Epilepsy, unspecified, not intractable, without status epilepticus: Secondary | ICD-10-CM | POA: Diagnosis not present

## 2024-02-16 LAB — ECHOCARDIOGRAM COMPLETE
AR max vel: 2.84 cm2
AV Area VTI: 2.84 cm2
AV Area mean vel: 3.46 cm2
AV Mean grad: 4 mmHg
AV Peak grad: 6.7 mmHg
Ao pk vel: 1.29 m/s
Area-P 1/2: 3.39 cm2
Height: 68 in
S' Lateral: 3 cm
Weight: 2720 [oz_av]

## 2024-02-16 LAB — PHENYTOIN LEVEL, TOTAL: Phenytoin Lvl: 8.8 ug/mL — ABNORMAL LOW (ref 10.0–20.0)

## 2024-02-16 LAB — LEVETIRACETAM LEVEL: Levetiracetam Lvl: 33.9 ug/mL (ref 10.0–40.0)

## 2024-02-16 LAB — GLUCOSE, CAPILLARY: Glucose-Capillary: 106 mg/dL — ABNORMAL HIGH (ref 70–99)

## 2024-02-16 MED ORDER — PHENYTOIN SODIUM EXTENDED 200 MG PO CAPS: ORAL_CAPSULE | ORAL | 0 refills | Status: DC

## 2024-02-16 MED ORDER — ATORVASTATIN CALCIUM 20 MG PO TABS: 20.0000 mg | ORAL_TABLET | Freq: Every day | ORAL | 0 refills | Status: DC

## 2024-02-16 MED ORDER — PHENYTOIN SODIUM EXTENDED 100 MG PO CAPS: 200.0000 mg | ORAL_CAPSULE | Freq: Every day | ORAL | Status: DC

## 2024-02-16 MED ORDER — SODIUM CHLORIDE 0.9 % IV SOLN
700.0000 mg | Freq: Once | INTRAVENOUS | Status: AC
  Administered 2024-02-16: 700 mg via INTRAVENOUS
  Filled 2024-02-16: qty 10

## 2024-02-16 MED ORDER — CLOPIDOGREL BISULFATE 75 MG PO TABS: 75.0000 mg | ORAL_TABLET | Freq: Every day | ORAL | 0 refills | Status: AC

## 2024-02-16 MED ORDER — PHENYTOIN SODIUM EXTENDED 30 MG PO CAPS: 150.0000 mg | ORAL_CAPSULE | Freq: Every day | ORAL | Status: DC

## 2024-02-16 MED ORDER — PHENYTOIN 50 MG PO CHEW: 200.0000 mg | CHEWABLE_TABLET | Freq: Every day | ORAL | 0 refills | Status: DC

## 2024-02-16 MED ORDER — PHENYTOIN SODIUM EXTENDED 100 MG PO CAPS
200.0000 mg | ORAL_CAPSULE | Freq: Every day | ORAL | Status: DC
  Filled 2024-02-16: qty 2

## 2024-02-16 MED ORDER — SODIUM CHLORIDE 0.9 % IV SOLN
700.0000 mg | Freq: Once | INTRAVENOUS | Status: DC
  Filled 2024-02-16: qty 14

## 2024-02-16 MED ORDER — ASPIRIN 81 MG PO TBEC: 81.0000 mg | DELAYED_RELEASE_TABLET | Freq: Every day | ORAL | 12 refills | Status: AC

## 2024-02-16 MED ORDER — SODIUM CHLORIDE 0.9 % IV SOLN: 500.0000 mg | Freq: Once | INTRAVENOUS | Status: DC

## 2024-02-16 MED ORDER — PHENYTOIN 50 MG PO CHEW: 150.0000 mg | CHEWABLE_TABLET | Freq: Every day | ORAL | 0 refills | Status: DC

## 2024-02-16 NOTE — Procedures (Addendum)
 Patient Name: Cameron Potts  MRN: [id number]  Epilepsy Attending: Arlin MALVA Krebs  Referring Physician/Provider: Denna Mimi ORN, NP  Duration: 02/15/2024 1754 to 02/16/2024 1140   Patient history:  88 y.o. 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 EEG to evaluate for seizure   Level of alertness: Awake, asleep   AEDs during EEG study: LEV, LCM, PHT   Technical aspects: This EEG study was done with scalp electrodes positioned according to the 10-20 International system of electrode placement. Electrical activity was reviewed with band pass filter of 1-70Hz , sensitivity of 7 uV/mm, display speed of 76mm/sec with a 60Hz  notched filter applied as appropriate. EEG data were recorded continuously and digitally stored.  Video monitoring was available and reviewed as appropriate.   Description: The posterior dominant rhythm consists of 7.5 Hz activity of moderate voltage (25-35 uV) seen predominantly in posterior head regions, symmetric and reactive to eye opening and eye closing. Sleep was characterized by vertex waves, sleep spindles (12 to 14 Hz), maximal frontocentral region.  EEG showed continuous polymorphic 3 to 6 Hz theta-delta slowing in right temporal region consistent with breach artifact. Spikes were noted in right anterior temporal region, predominantly during sleep. Intermittent generalized 3-5hz  theta-delta slowing was also noted. Hyperventilation and photic stimulation were not performed.      ABNORMALITY - Spike, anterior temporal region - Breach artifact, right temporal region - Intermittent  slow, generalized   IMPRESSION: This study is consistent with patient's known history of focal epilepsy arising from right anterior temporal region. Additionally there is cortical dysfunction arising from right temporal region consistent with underlying craniotomy. Lastly there was  mild diffuse encephalopathy. No seizures were seen throughout the recording.   Angeles Paolucci O Shirly Bartosiewicz

## 2024-02-16 NOTE — TOC Transition Note (Signed)
 Transition of Care [facility]) - Discharge Note   Patient Details  Name: Cameron Potts MRN: [id number] Date of Birth: December 09, 1935  [facility] CM/SW Contact:  Isaiah Public, LCSWA Phone Number: 02/16/2024, 3:20 PM   Clinical Narrative:     Patient will DC to: [facility]  Anticipated DC date: 02/16/2024  Family notified: Art gallery manager by: By car/[name]  ?  Per MD patient ready for DC to [facility]. RN, patient, patient's family, and facility notified of DC. HHPT/OT orders faxed to facility.Discharge Summary sent to facility. RN given number for report [phone] or [phone]. DC packet on chart.   CSW signing off.     Barriers to Discharge: No Barriers Identified   Patient Goals and CMS Choice Patient states their goals for this hospitalization and ongoing recovery are:: to return to ALF   Choice offered to / list presented to : Patient, Spouse      Discharge Placement                       Discharge Plan and Services Additional resources added to the After Visit Summary for   In-house Referral: Clinical Social Work                                   Social Drivers of Health (SDOH) Interventions SDOH Screenings   Food Insecurity: No Food Insecurity (02/14/2024)  Housing: Low Risk  (02/14/2024)  Transportation Needs: No Transportation Needs (02/14/2024)  Utilities: Not At Risk (02/14/2024)  Alcohol Screen: Low Risk  (05/27/2018)  Depression (PHQ2-9): Low Risk  (08/25/2023)  Financial Resource Strain: Low Risk  (04/15/2018)  Physical Activity: Sufficiently Active (04/15/2018)  Social Connections: Moderately Isolated (02/14/2024)  Stress: No Stress Concern Present (04/15/2018)  Tobacco Use: Low Risk  (02/14/2024)     Readmission Risk Interventions     No data to display

## 2024-02-16 NOTE — Progress Notes (Addendum)
 STROKE TEAM PROGRESS NOTE   SUBJECTIVE (INTERVAL HISTORY) His wife is at the bedside. Pt is sitting in chair. Still on LTM EEG and still has R temporal spikes. Dilantin  trough levels were low. Will give load and increase dilantin  to 200mg  bid.    OBJECTIVE Temp:  [97.3 F (36.3 C)-98.3 F (36.8 C)] 97.9 F (36.6 C) (09/01 0734) Pulse Rate:  [53-63] 60 (09/01 0324) Cardiac Rhythm: Atrial paced (09/01 0815) Resp:  [16-18] 16 (09/01 0324) BP: (153-193)/(58-77) 153/69 (09/01 0734) SpO2:  [97 %-98 %] 97 % (09/01 0324)  Recent Labs  Lab 02/14/24 1000 02/16/24 0902  GLUCAP 90 106*   Recent Labs  Lab 02/14/24 1006 02/14/24 1012 02/14/24 1026 02/15/24 0513  NA 135 136  --  135  K 4.2 4.3  --  4.2  CL 105 103  --  103  CO2 20*  --   --  22  GLUCOSE 95 97  --  90  BUN 13 14  --  10  CREATININE 0.74 0.70  --  0.65  CALCIUM  8.9  --   --  8.6*  MG  --   --  2.0 1.8  PHOS  --   --   --  3.9   Recent Labs  Lab 02/14/24 1006 02/15/24 0513  AST 23 21  ALT 18 16  ALKPHOS 126 116  BILITOT 0.6 0.7  PROT 7.5 6.4*  ALBUMIN 4.0 3.4*   Recent Labs  Lab 02/14/24 1006 02/14/24 1012 02/15/24 0513  WBC 6.1  --  5.4  NEUTROABS 3.2  --   --   HGB 12.9* 12.6* 12.0*  HCT 36.7* 37.0* 33.5*  MCV 93.4  --  91.5  PLT 209  --  186   No results for input(s): CKTOTAL, CKMB, CKMBINDEX, TROPONINI in the last 168 hours. Recent Labs    02/14/24 1006  LABPROT 13.9  INR 1.0   Recent Labs    02/14/24 1135  COLORURINE YELLOW  LABSPEC 1.011  PHURINE 7.0  GLUCOSEU NEGATIVE  HGBUR NEGATIVE  BILIRUBINUR NEGATIVE  KETONESUR NEGATIVE  PROTEINUR NEGATIVE  NITRITE NEGATIVE  LEUKOCYTESUR NEGATIVE       Component Value Date/Time   CHOL 184 02/15/2024 0513   TRIG 59 02/15/2024 0513   HDL 59 02/15/2024 0513   CHOLHDL 3.1 02/15/2024 0513   VLDL 12 02/15/2024 0513   LDLCALC 113 (H) 02/15/2024 0513   Lab Results  Component Value Date   HGBA1C 5.1 02/14/2024      Component  Value Date/Time   LABOPIA NONE DETECTED 02/14/2024 1135   COCAINSCRNUR NONE DETECTED 02/14/2024 1135   LABBENZ NONE DETECTED 02/14/2024 1135   AMPHETMU NONE DETECTED 02/14/2024 1135   THCU NONE DETECTED 02/14/2024 1135   LABBARB NONE DETECTED 02/14/2024 1135    Recent Labs  Lab 02/14/24 1006  ETH <15    I have personally reviewed the radiological images below and agree with the radiology interpretations.  Overnight EEG with video Result Date: 02/15/2024 Shelton Arlin KIDD, MD     02/16/2024  9:28 AM Patient Name: Cameron Potts MRN: [id number] Epilepsy Attending: Arlin KIDD Shelton Referring Physician/Provider: Denna Mimi ORN, NP Duration: 02/14/2024 1754 to 02/15/2024 1754 Patient history:  88 y.o. 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 EEG to evaluate for seizure Level of alertness: Awake, asleep AEDs during EEG study: LEV, LCM, PHT Technical aspects: This EEG study was done with scalp electrodes positioned according to the 10-20 International system of electrode placement. Electrical activity was reviewed with band pass filter of 1-70Hz , sensitivity of 7 uV/mm, display speed of 77mm/sec with a 60Hz  notched filter applied as appropriate. EEG data were recorded continuously and digitally stored.  Video monitoring was available and reviewed as appropriate. Description: The posterior dominant rhythm consists of 7.5 Hz activity of moderate voltage (25-35 uV) seen predominantly in posterior head regions, symmetric and reactive to eye opening and eye closing. Sleep was characterized by vertex waves, sleep  spindles (12 to 14 Hz), maximal frontocentral region.  EEG showed continuous polymorphic 3 to 6 Hz theta-delta slowing in right temporal region consistent with breach artifact. Spikes were noted in right anterior temporal region, predominantly during sleep. Intermittent generalized 3-5hz  theta-delta slowing was also noted. Hyperventilation and photic stimulation were not performed.   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 Additionally there is cortical dysfunction arising from right temporal region consistent with underlying craniotomy. Lastly there was mild diffuse encephalopathy. No seizures were seen throughout the recording. Priyanka MALVA Krebs   CT ANGIO HEAD NECK W WO CM (CODE STROKE) Result Date: 02/14/2024 EXAM: CT HEAD WITHOUT CTA HEAD AND NECK WITH AND WITHOUT 02/14/2024 04:59:55 PM TECHNIQUE: CTA of the head and neck was performed with and without the administration of intravenous contrast. Noncontrast CT of the head with reconstructed 2-D images are also provided for review. Multiplanar 2D and/or 3D reformatted images are provided for review. Automated exposure control, iterative reconstruction, and/or weight based adjustment of the mA/kV was utilized to reduce the radiation dose to as low as reasonably achievable. COMPARISON: CT head without contrast 02/14/24. CLINICAL HISTORY: Neuro deficit, acute, stroke suspected. FINDINGS: CT HEAD: BRAIN AND VENTRICLES: No acute intracranial hemorrhage. No mass effect. No midline shift. No extra-axial fluid collection. No evidence of acute infarct. No hydrocephalus. ORBITS: No acute abnormality. SINUSES AND MASTOIDS: No acute abnormality. CTA NECK: AORTIC ARCH AND ARCH VESSELS: Atherosclerotic calcifications are present at the origin of the left subclavian  artery without a significant stenosis. Calcifications are present in the distal arch. No aneurysm or dissection is present. CERVICAL CAROTID ARTERIES: Mural calcifications are present along the right common carotid artery. More complex calcifications are present at the right carotid bifurcation. No significant stenosis of greater than 50% is present relative to the more distal vessels. Atherosclerotic calcifications are present at the left carotid bifurcation without significant stenosis. CERVICAL VERTEBRAL ARTERIES: A high-grade stenosis present in the origin of the right vertebral artery. No additional stenosis are present in either vertebral artery in the neck. LUNGS AND MEDIASTINUM: Unremarkable. SOFT TISSUES: A right thyroid  nodule measures 2.5 cm. Esophagus is distended. No mass lesion is present. Bilateral subcutaneous battery packs and control units are placed over the chest. BONES: Multilevel degenerative changes are present in the cervical spine. Ankylosis is present across the disc space at C2-3 and C3-4. Slight degenerative anterolisthesis present at C4-C5. Uncovertebral spurring contributes to foraminal narrowing bilaterally at C5-6 and C6-7. CTA HEAD: ANTERIOR CIRCULATION: Right cavernous ICA stent is patent. Atherosclerotic calcifications are  present in the cavernous left ICA without significant stenosis relative to the ICA terminus. High-grade stenosis are present in the proximal A1 segments bilaterally. Migraine stenosis present in the distal right M1 segment. Segmental narrowing is present throughout the ACA and MCA branch vessels. POSTERIOR CIRCULATION: High-grade stenosis are present in the proximal left P1 and P2 segments. A high-grade stenosis is present in the more distal right P2 segment. Marked attenuation of PCA branch vessels is noted, right greater than left. OTHER: No dural venous sinus thrombosis on this non-dedicated study. IMPRESSION: 1. Migraine, near occlusive, stenosis of the the  mid right p2 segment corresponds with the right occipital infarct. 2. High-grade stenosis in the origin of the right vertebral artery. 3. High-grade stenosis in the proximal A1 segments bilaterally, distal right M1 segment and moderate egmental stenoses throughout the ACA and MCA branch vessels. 4. Moderate proximal left P1 and P2 segments. Marked attenuation of PCA branch vessels, right greater than left. Electronically signed by: Lonni Necessary MD 02/14/2024 05:11 PM EDT RP Workstation: [id number]   CT HEAD CODE STROKE WO CONTRAST Result Date: 02/14/2024 EXAM: CT HEAD WITHOUT 02/14/2024 04:51:03 PM TECHNIQUE: CT of the head was performed without the administration of intravenous contrast. Automated exposure control, iterative reconstruction, and/or weight based adjustment of the mA/kV was utilized to reduce the radiation dose to as low as reasonably achievable. COMPARISON: CT head without contrast 02/14/2024 at 10:35 AM. CLINICAL HISTORY: Neuro deficit, acute, stroke suspected. FINDINGS: BRAIN AND VENTRICLES: Evolving inferior right occipital lobe infarct is noted. Right perianal craniotomy and temporal lobe resection is again noted. A remote infarct in the high medial right frontal lobe is stable. Atrophy and white matter disease, mildly advanced for age is otherwise stable. ORBITS: Lens replacement. SINUSES AND MASTOIDS: Moderate mucosal thickening is present in the maxillary sinuses bilaterally. Bilateral maxillary antrostomies are noted. Minimal fluid is present in the inferior left mastoid air cells. No obstructing nasopharyngeal lesion is present. SOFT TISSUES AND SKULL: No acute skull fracture. No acute soft tissue abnormality. VASCULATURE: Right cavernous internal carotid artery stent is in place. Atherosclerotic changes are present in the cavernous left ICA. IMPRESSION: 1. Evolving inferior right occipital lobe infarct. 2. Stable remote infarct in the high medial right frontal lobe. 3. Right  perianal craniotomy and temporal lobe resection. 4. Right cavernous internal carotid artery stent in place. Atherosclerotic changes in the cavernous left ICA. 5. Mildly advanced atrophy and white matter disease, stable. Electronically signed by: Lonni Necessary MD 02/14/2024 05:01 PM EDT RP Workstation: [id number]   CT HEAD WO CONTRAST Result Date: 02/14/2024 CLINICAL DATA:  88 year old male with left eye vision changes and abnormal gait since yesterday morning. EXAM: CT HEAD WITHOUT CONTRAST TECHNIQUE: Contiguous axial images were obtained from the base of the skull through the vertex without intravenous contrast. RADIATION DOSE REDUCTION: This exam was performed according to the departmental dose-optimization program which includes automated exposure control, adjustment of the mA and/or kV according to patient size and/or use of iterative reconstruction technique. COMPARISON:  Head CT 08/05/2023. FINDINGS: Brain: Chronic right middle cranial fossa resection cavity, subtotal absence of the right temporal lobe. Stable cerebral volume from earlier this year. New confluent hypodensity in the right occipital pole since February (coronal image 15 and series 5, image 26), along the inferior calcarine sulcus. No associated hemorrhage or mass effect. See sagittal image 26. No other acute cortically based infarct identified. Stable encephalomalacia in the distal right ACA territory, right cingulate gyrus (coronal image 43). No superimposed No midline  shift, ventriculomegaly, mass effect, acute intracranial hemorrhage. Vascular: Calcified atherosclerosis at the skull base. No suspicious intracranial vascular hyperdensity. Skull: Chronic right frontotemporal craniotomy. No acute osseous abnormality identified. Sinuses/Orbits: Chronic paranasal sinus disease with up to moderate mucoperiosteal thickening bilaterally has not significantly changed. Tympanic cavities and mastoids remain well aerated. Other: No acute orbit  or scalp soft tissue finding. Chronic postoperative changes to both globes. IMPRESSION: 1. Positive for small acute or subacute Right PCA territory infarct affecting the inferior right occipital pole. Correlate for Left visual field deficits. No hemorrhagic transformation or mass effect. 2. Otherwise stable including chronic distal right ACA infarct, chronic right middle cranial fossa resection. Chronic paranasal sinus disease. Electronically Signed   By: VEAR Hurst M.D.   On: 02/14/2024 10:48   CUP PACEART REMOTE DEVICE CHECK Result Date: 01/29/2024 ICD Monthly battery check.  Estimated 4 months. Normal device function. No new alerts. 6 AT/AF episodes, all on 01/13/24 between 14:48 and 16:56, longest 54 min 15 sec, burden 0.3% since 12/29/23, not on OAC due to contraindication with low occurrence per Epic. Follow up as scheduled monthly. MC, CVRS ICD Monthly battery check.  Estimated 4 months. Normal device function. No new alerts. 6 AT/AF episodes, all on 01/13/24 between 14:48 and 16:56, longest 54 min 15 sec, V rates > 100 bpm ~ 35%, burden 0.3% since 12/29/23, not on OAC due to contraindication with low occurrence per Epic. Follow up as scheduled monthly. MC, CVRS    PHYSICAL EXAM  Temp:  [97.3 F (36.3 C)-98.3 F (36.8 C)] 97.9 F (36.6 C) (09/01 0734) Pulse Rate:  [53-63] 60 (09/01 0324) Resp:  [16-18] 16 (09/01 0324) BP: (153-193)/(58-77) 153/69 (09/01 0734) SpO2:  [97 %-98 %] 97 % (09/01 0324)  General - Well nourished, well developed, in no apparent distress.  Ophthalmologic - fundi not visualized due to noncooperation.  Cardiovascular - Regular rhythm and rate.  Mental Status -  Level of arousal and orientation to time, place, and person were intact. Language including expression, naming, repetition, comprehension was assessed and found intact. Attention span and concentration were normal. Fund of Knowledge was assessed and was intact.  Cranial Nerves II - XII - II - Visual field  intact OD, on the left, exam not very consistent but he had left upper quadrantanopia vs. Simultagnosia. Left lower quadrant decreased visual acuity.  Potts, IV, VI - Extraocular movements intact. V - Facial sensation intact bilaterally. VII - Facial movement intact bilaterally. VIII - Hearing & vestibular intact bilaterally. X - Palate elevates symmetrically. XI - Chin turning & shoulder shrug intact bilaterally. XII - Tongue protrusion intact.  Motor Strength - The patient's strength was normal in all extremities and pronator drift was absent.  Bulk was normal and fasciculations were absent.   Motor Tone - Muscle tone was assessed at the neck and appendages and was normal.  Reflexes - The patient's reflexes were symmetrical in all extremities and he had no pathological reflexes.  Sensory - Light touch, temperature/pinprick were assessed and were symmetrical.    Coordination - The patient had normal movements in the hands with no ataxia or dysmetria.  Tremor was absent.  Gait and Station - deferred.   ASSESSMENT/PLAN Mr. Abrham Maslowski Potts is a 88 y.o. male with history of epilepsy s/p VNS on AEDs, afib not on AC now, hx of cardiac arrest s/p pacemaker, orthostatic hypotension, gait disorder, CNS mass s/p R temporal resection, stroke with residue left [facility] admitted for episode of worsening left HH,  confusion. While on the floor, he had 3 episodes of aphasia, slurry speech, left sided weakness. No TNK given due to outside window.    Possible stroke: possible right PCA infarct secondary to R PCA large vessel disease source CT R PCA small acute or subacute infarct affecting inferior R occipital pole, chronic R ACA infarct, and R middle cranial fossa resection CT repeat Evolving inferior right occipital lobe infarct CTA head and neck near occlusive, stenosis of the the mid right p2 segment corresponds with the right occipital infarct. High-grade stenosis in the origin of the right [place].  High-grade stenosis in the proximal A1 segments bilaterally, distal right M1 segment and moderate egmental stenoses throughout the ACA and MCA branch vessels. Moderate proximal left P1 and P2 segments. Marked attenuation of PCA branch vessels, right greater than left. MRI  not able to perform due to VNS 2D Echo  pending LDL 113 HgbA1c 5.1 UDS neg SCDs for VTE prophylaxis No antithrombotic prior to admission, now on aspirin  81 mg daily and clopidogrel  75 mg daily DAPT for 3 weeks and then ASA alone Patient counseled to be compliant with his antithrombotic medications Ongoing aggressive stroke risk factor management Therapy recommendations:  HH PT and OT Disposition:  pending  Epilepsy  Breakthrough seizures due to ? Stroke or ? Low dilantin  level Since R temporal mass resection S/p VNS On home dilantin  100/200, keppra  XR 2250, vimpat  200 bid Last dilantin  level 7.7 on 01/13/24 Yesterday he felt he had some worsening of the baseline left visual field vision difficulty, not able to read on the left and does not feel well, dull brain. While on the 6E unit, he had 3 episodes of aphasia, slurry speech, left arm and hand weakness, with one episode with left arm twitching/shaking. Each episode lasted about 5-8 min, all 3 episodes happened within 30 min. Post episodes, he was back to baseline. This morning, he is at baseline LTM EEG x 2 days showed R temporal spike with breach artifact Continue home keppra  and vimpat  on discharge Dilantin  free level pending Dilantin  total level 9.8->8.9>8.8 Will give dilantin  700mg  load and increase dilantin  to 200mg  bid on discharge.  Follows with Dr Gregg at [facility] on 03/22/24  Afib On flecainide  Was on warfarin in the past but d/c due to frequent falls and epilepsy Now on ASA and plavix  for large vessel disease with stroke  Hypertension Hx of orthostatic hypotension Stable on the high end Long term BP goal normotensive  Hyperlipidemia Home meds:  none   LDL 113, goal < 70 Now on lipitor 20 No high intensity statin due to advanced age Continue statin at discharge  Other Stroke Risk Factors Advanced age Hx of cardiac arrest s/p pacemaker  Other Active Problems Gait disorder R temporal mass s/p resection  Hospital day # 2  Neurology will sign off. Please call with questions. Pt will follow up with Dr .Gregg at [facility] on 03/22/24. Thanks for the consult.    Ary Cummins, MD PhD Stroke Neurology 02/16/2024 10:38 AM    To contact Stroke Continuity provider, please refer to [email or web address]. After hours, contact General Neurology

## 2024-02-16 NOTE — Progress Notes (Signed)
 SLP Cancellation Note  Patient Details Name: Cameron Potts MRN: [id number] DOB: 03/03/1936   Cancelled treatment:       Reason Eval/Treat Not Completed: Patient declined, no reason specified (Pt and wife educated re: role of SLP and rationale for assessment. Both politely declined stating pt is at baseline.)  Delon Bangs, M.S., CCC-SLP Speech-Language Pathologist Secure Chat Preferred  O: [phone]  Delon CHRISTELLA Bangs 02/16/2024, 10:15 AM

## 2024-02-16 NOTE — Discharge Instructions (Signed)
 Advised to follow-up with primary care physician in 1 week. Advised to follow-up with neurology Dr. Gregg as scheduled. Advised to take Dilantin  200 mg twice daily.  Continue Keppra  and Vimpat  as prior dosing. Advised to take aspirin  and Plavix  for 3 weeks followed by aspirin  monotherapy. Schedule outpatient echocardiogram with was pending.

## 2024-02-16 NOTE — Progress Notes (Signed)
 LTM EEG disconnected - no skin breakdown at [facility]. [facility] notified.

## 2024-02-16 NOTE — Discharge Summary (Signed)
 Physician Discharge Summary  Cameron Potts FMW:[id number] DOB: 03/01/1936 DOA: 02/14/2024  PCP: Charlanne Fredia CROME, MD  Admit date: 02/14/2024  Discharge date: 02/16/2024  Admitted From: Home.  Disposition:  [facility]  Recommendations for Outpatient Follow-up:  Follow up with PCP in 1-2 weeks. Please obtain BMP/CBC in one week Advised to follow-up with Neurology Dr. Gregg as scheduled. Advised to take Dilantin  200 mg twice daily.  Continue Keppra  and Vimpat  at prior dosing. Advised to take aspirin  and Plavix  for 3 weeks followed by aspirin  monotherapy. Follow-up 2D echocardiogram report.  Home Health:None Equipment/Devices:None  Discharge Condition: Stable CODE STATUS:Full code Diet recommendation: Heart Healthy   Brief [facility] Hospital Course: This 87 yrs old Male with PMH significant for seizure disorder, history of CVA in the past, hyperlipidemia, hypertension, atrial fibrillation, glaucoma, status post pacemaker, vagal nerve stimulator, gait abnormality, orthostatic hypotension, history of cardiac arrest in the past, lives at [facility], patient usually ambulates with the help of walker, patient does have baseline left-sided peripheral vision defects secondary to left-sided optic nerve damage from intractable seizures in the past .  Patient was noted to have worsening peripheral visual field loss on the left side in the morning today at nursing home.  Patient describes to the nurse that something is wrong with his vision which got worse.  Patient was brought in the ED and  stroke code was called. CT head:  Positive for small acute or subacute Right PCA territory infarct affecting the inferior right occipital pole. Correlate for Left visual field deficits. No hemorrhagic transformation or mass effect. Otherwise stable including chronic distal right ACA infarct, chronic right middle cranial fossa resection.  Patient was admitted for further evaluation.  Patient was  seen by Neurology workup so far completed.  Neurologist recommended to continue aspirin  and Plavix  for 3 weeks followed by aspirin  only therapy. Patient had EEG as recommended neurologist.  Patient was continued on antiseizure medications Dilantin , Keppra  and Vimpat .  Dilantin  level was low.  Patient was loaded with fosphenytoin  and the dose was increased.  Echocardiogram completed report is pending.  Patient does not want to wait and wants to be discharged home.  Neurology is agreeable.  Patient being discharged to [facility].   Discharge Diagnoses:  Principal Problem:   Acute CVA (cerebrovascular accident) (HCC) Active Problems:   Complex partial seizure (HCC)   Chronic atrial fibrillation   Parkinsonian syndrome associated with symptomatic orthostatic hypotension (HCC)   Adjustment disorder with anxious mood   Hyperlipidemia   ICD (implantable cardioverter-defibrillator) in place   Status post VNS (vagus nerve stimulator) placement   Neuropathy, peripheral   Benign prostatic hyperplasia with urinary frequency  Acute/subacute CVA: Patient presented with worsening left-sided peripheral vision field. CT head > small acute or subacute RCA territory infarct involving inferior right occipital lobe. Continue CVA workup. Frequent neurochecks every 4 hours Allow permissive hypertension for 48 hours. 2D echocardiogram completed, report is pending. MRI cannot be obtained since patient has pacemaker and vagus nerve stimulator. Patient passed bedside swallow evaluation and started on soft diet. Neurology is consulted. EDP discussed the case with Dr. Matthews , recommended CVA workup. Continue aspirin  and Plavix  for 3 weeks followed by aspirin  only therapy. PT and OT recommended SNF. Stroke team continue to follow.   Seizure disorder: Patient takes 3 different antiseizure medications. Resume Dilantin , Keppra  and Vimpat . Patient had an episode of worsening peripheral vision loss. Presentation is  consistent with breakthrough seizures. Continue long-term EEG, seizure precautions.  Dilantin   level was low. As per epileptologist increase Dilantin  to 200 mg twice daily, continue Keppra  and Vimpat  at the prior dosing. Advised to follow-up outpatient with neurology.   Chronic atrial fibrillation: Heart rate is controlled, Continue flecainide , patient is not on anticoagulation.   BPH: Continue Proscar  and  Flomax .   Permanent pacemaker: Stable, No concerns,  follow-up outpatient.   Hyperlipidemia Continue Lipitor , LDL 113 above goal.   Adjustment disorder with anxious mood. Continue alprazolam .   Parkinson's disease: Not on anti-Parkinson's medication. Neurology follow-up.  Discharge Instructions  Discharge Instructions     Call MD for:  difficulty breathing, headache or visual disturbances   Complete by: As directed    Call MD for:  persistant dizziness or light-headedness   Complete by: As directed    Call MD for:  persistant nausea and vomiting   Complete by: As directed    Diet - low sodium heart healthy   Complete by: As directed    Diet Carb Modified   Complete by: As directed    Discharge instructions   Complete by: As directed    Advised to follow-up with primary care physician in 1 week. Advised to follow-up with neurology Dr. Gregg as scheduled. Advised to take Dilantin  200 mg twice daily.  Continue Keppra  and Vimpat  as prior dosing. Advised to take aspirin  and Plavix  for 3 weeks followed by aspirin  monotherapy. Schedule outpatient echocardiogram with was pending.   Increase activity slowly   Complete by: As directed       Allergies as of 02/16/2024       Reactions   Depakote [divalproex Sodium] Other (See Comments)   Arthralgias        Medication List     STOP taking these medications    hydrALAZINE  10 MG tablet Commonly known as: APRESOLINE        TAKE these medications    acetaminophen  325 MG tablet Commonly known as: TYLENOL  Take  650 mg by mouth every 6 (six) hours as needed for mild pain (pain score 1-3) or moderate pain (pain score 4-6).   ALPRAZolam  0.25 MG tablet Commonly known as: XANAX  Take 1 tablet (0.25 mg total) by mouth 2 (two) times daily as needed for anxiety.   aspirin  EC 81 MG tablet Take 1 tablet (81 mg total) by mouth daily. Swallow whole. Start taking on: February 17, 2024   atorvastatin  20 MG tablet Commonly known as: LIPITOR Take 1 tablet (20 mg total) by mouth daily. Start taking on: February 17, 2024   brimonidine -timolol  0.2-0.5 % ophthalmic solution Commonly known as: COMBIGAN  Place 1 drop into both eyes every 12 (twelve) hours.   camphor-menthol lotion Commonly known as: SARNA Apply 1 Application topically every 8 (eight) hours as needed for itching.   cetirizine  10 MG tablet Commonly known as: ZYRTEC  Take 10 mg by mouth as needed for allergies.   cholecalciferol 10 MCG (400 UNIT) Tabs tablet Commonly known as: VITAMIN D3 Take 400 Units by mouth 2 (two) times daily.   clobetasol  cream 0.05 % Commonly known as: TEMOVATE  Apply 1 application  topically 2 (two) times daily as needed (rash).   clopidogrel  75 MG tablet Commonly known as: PLAVIX  Take 1 tablet (75 mg total) by mouth daily for 21 days. Start taking on: February 17, 2024   econazole nitrate 1 % cream Apply 1 Application topically 2 (two) times daily as needed.   finasteride  5 MG tablet Commonly known as: PROSCAR  Take 5 mg by mouth every morning.  flecainide  50 MG tablet Commonly known as: TAMBOCOR  Take 50 mg by mouth 2 (two) times daily.   lacosamide  200 MG Tabs tablet Commonly known as: VIMPAT  Take 1 tablet (200 mg total) by mouth 2 (two) times daily.   latanoprost  0.005 % ophthalmic solution Commonly known as: XALATAN  Place 1 drop into both eyes at bedtime.   levETIRAcetam  750 MG 24 hr tablet Commonly known as: KEPPRA  XR Take 2,250 mg by mouth every evening.   LORazepam  2 MG/ML concentrated  solution Commonly known as: ATIVAN  Take 0.5 mLs (1 mg total) by mouth as needed for seizure or anxiety. What changed:  how much to take when to take this reasons to take this   phenytoin  200 MG ER capsule Commonly known as: DILANTIN  Take 1 capsule (200 mg total) by mouth daily AND 1 capsule (200 mg total) at bedtime. What changed:  medication strength See the new instructions. Another medication with the same name was removed. Continue taking this medication, and follow the directions you see here.   polyethylene glycol 17 g packet Commonly known as: MIRALAX  / GLYCOLAX  Take 17 g by mouth every other day.   sodium chloride  1 g tablet Take 2 tablets (2 g total) by mouth 3 (three) times daily.   talc powder Apply 1 Application topically 2 (two) times daily.   tamsulosin  0.4 MG Caps capsule Commonly known as: FLOMAX  Take 0.4 mg by mouth in the morning.   triamcinolone cream 0.1 % Commonly known as: KENALOG Apply 1 Application topically 2 (two) times daily as needed (Rash).   Tums Smoothies 750 MG chewable tablet Generic drug: calcium  carbonate Chew 2 tablets by mouth 4 (four) times daily as needed for heartburn.        Follow-up Information     Gregg Lek, MD. Go on 03/22/2024.   Specialty: Neurology Contact information: [address] [place] [address] [phone]         Charlanne Fredia CROME, MD Follow up in 1 week(s).   Specialty: Internal Medicine Contact information: [address] [place] [address] [phone]                Allergies  Allergen Reactions   Depakote [Divalproex Sodium] Other (See Comments)    Arthralgias     Consultations: Neurology   Procedures/Studies: ECHOCARDIOGRAM COMPLETE Result Date: 02/16/2024    ECHOCARDIOGRAM REPORT   Patient Name:   Jama Elex Candy Potts Date of Exam: 02/16/2024 Medical Rec #:  [id number]              Height:       68.0 in Accession #:    [id number]             Weight:        170.0 lb Date of Birth:  07/19/1935              BSA:          1.907 m Patient Age:    88 years               BP:           153/69 mmHg Patient Gender: M                      HR:           64 bpm. Exam Location:  Inpatient Procedure: 2D Echo, Cardiac Doppler and Color Doppler (Both Spectral and Color  Flow Doppler were utilized during procedure). Indications:    Stroke I63.9  History:        Patient has prior history of Echocardiogram examinations, most                 recent 03/21/2022. Arrythmias:Atrial Fibrillation.  Sonographer:    Jayson Gaskins Referring Phys: [id number] Nehal Shives KHATRI IMPRESSIONS  1. Left ventricular ejection fraction, by estimation, is 60 to 65%. The left ventricle has normal function. The left ventricle has no regional wall motion abnormalities. Left ventricular diastolic parameters were normal.  2. Right ventricular systolic function is normal. The right ventricular size is normal.  3. Left atrial size was mild to moderately dilated.  4. The mitral valve is normal in structure. No evidence of mitral valve regurgitation. No evidence of mitral stenosis.  5. The aortic valve is normal in structure. Aortic valve regurgitation is not visualized. No aortic stenosis is present.  6. The inferior vena cava is normal in size with greater than 50% respiratory variability, suggesting right atrial pressure of 3 mmHg. Comparison(s): No significant change from prior study. Prior images reviewed side by side. FINDINGS  Left Ventricle: Left ventricular ejection fraction, by estimation, is 60 to 65%. The left ventricle has normal function. The left ventricle has no regional wall motion abnormalities. The left ventricular internal cavity size was normal in size. There is  no left ventricular hypertrophy. Left ventricular diastolic parameters were normal. Right Ventricle: The right ventricular size is normal. No increase in right ventricular wall thickness. Right ventricular systolic function is normal.  Left Atrium: Left atrial size was mild to moderately dilated. Right Atrium: Right atrial size was normal in size. Pericardium: There is no evidence of pericardial effusion. Mitral Valve: The mitral valve is normal in structure. No evidence of mitral valve regurgitation. No evidence of mitral valve stenosis. Tricuspid Valve: The tricuspid valve is normal in structure. Tricuspid valve regurgitation is not demonstrated. No evidence of tricuspid stenosis. Aortic Valve: The aortic valve is normal in structure. Aortic valve regurgitation is not visualized. No aortic stenosis is present. Aortic valve mean gradient measures 4.0 mmHg. Aortic valve peak gradient measures 6.7 mmHg. Aortic valve area, by VTI measures 2.84 cm. Pulmonic Valve: The pulmonic valve was not well visualized. Pulmonic valve regurgitation is not visualized. No evidence of pulmonic stenosis. Aorta: The aortic root is normal in size and structure. Venous: The inferior vena cava is normal in size with greater than 50% respiratory variability, suggesting right atrial pressure of 3 mmHg. IAS/Shunts: No atrial level shunt detected by color flow Doppler. Additional Comments: A device lead is visualized in the right ventricle.  LEFT VENTRICLE PLAX 2D LVIDd:         3.80 cm   Diastology LVIDs:         3.00 cm   LV e' medial:    8.27 cm/s LV PW:         1.00 cm   LV E/e' medial:  12.3 LV IVS:        0.90 cm   LV e' lateral:   11.50 cm/s LVOT diam:     1.90 cm   LV E/e' lateral: 8.9 LV SV:         78 LV SV Index:   41 LVOT Area:     2.84 cm  RIGHT VENTRICLE RV S prime:     11.10 cm/s TAPSE (M-mode): 2.5 cm LEFT ATRIUM  Index        RIGHT ATRIUM           Index LA Vol (A2C):   68.6 ml 35.97 ml/m  RA Area:     18.80 cm LA Vol (A4C):   45.2 ml 23.70 ml/m  RA Volume:   49.00 ml  25.69 ml/m LA Biplane Vol: 60.0 ml 31.46 ml/m  AORTIC VALVE AV Area (Vmax):    2.84 cm AV Area (Vmean):   3.46 cm AV Area (VTI):     2.84 cm AV Vmax:           129.00 cm/s  AV Vmean:          92.700 cm/s AV VTI:            0.274 m AV Peak Grad:      6.7 mmHg AV Mean Grad:      4.0 mmHg LVOT Vmax:         129.00 cm/s LVOT Vmean:        113.000 cm/s LVOT VTI:          0.274 m LVOT/AV VTI ratio: 1.00  AORTA Ao Root diam: 3.20 cm MITRAL VALVE MV Area (PHT): 3.39 cm     SHUNTS MV Decel Time: 224 msec     Systemic VTI:  0.27 m MV E velocity: 102.00 cm/s  Systemic Diam: 1.90 cm Mihai Croitoru MD Electronically signed by Jerel Balding MD Signature Date/Time: 02/16/2024/2:13:46 PM    Final    Overnight EEG with video Result Date: 02/15/2024 Shelton Arlin KIDD, MD     02/16/2024  9:28 AM Patient Name: Axcel Horsch Potts MRN: [id number] Epilepsy Attending: Arlin KIDD Shelton Referring Physician/Provider: Denna Mimi ORN, NP Duration: 02/14/2024 1754 to 02/15/2024 1754 Patient history:  88 y.o. 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 EEG to evaluate for seizure Level of alertness: Awake, asleep AEDs during EEG study: LEV, LCM, PHT Technical aspects: This EEG study was done with scalp electrodes positioned according to the 10-20 International system of electrode placement. Electrical activity was reviewed with band pass filter of 1-70Hz , sensitivity of 7 uV/mm, display speed of 91mm/sec with a 60Hz  notched filter applied as appropriate. EEG data were recorded continuously and digitally stored.  Video monitoring was available and reviewed as appropriate. Description: The posterior dominant rhythm consists of 7.5 Hz activity of moderate voltage (25-35 uV) seen predominantly in posterior head regions, symmetric  and reactive to eye opening and eye closing. Sleep was characterized by vertex waves, sleep spindles (12 to 14 Hz), maximal frontocentral region.  EEG showed continuous polymorphic 3 to 6 Hz theta-delta slowing in right temporal region consistent with breach artifact. Spikes were noted in right anterior temporal region, predominantly during sleep. Intermittent generalized 3-5hz  theta-delta slowing was also noted. Hyperventilation and photic stimulation were not performed.   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 Additionally there is cortical dysfunction arising from right temporal region consistent with underlying craniotomy. Lastly there was mild diffuse encephalopathy.  No seizures were seen throughout the recording. Priyanka MALVA Krebs   CT ANGIO HEAD NECK W WO CM (CODE STROKE) Result Date: 02/14/2024 EXAM: CT HEAD WITHOUT CTA HEAD AND NECK WITH AND WITHOUT 02/14/2024 04:59:55 PM TECHNIQUE: CTA of the head and neck was performed with and without the administration of intravenous contrast. Noncontrast CT of the head with reconstructed 2-D images are also provided for review. Multiplanar 2D and/or 3D reformatted images are provided for review. Automated exposure control, iterative reconstruction, and/or weight based adjustment of the mA/kV was utilized to reduce the radiation dose to as low as reasonably achievable. COMPARISON: CT head without contrast 02/14/24. CLINICAL HISTORY: Neuro deficit, acute, stroke suspected. FINDINGS: CT HEAD: BRAIN AND VENTRICLES: No acute intracranial hemorrhage. No mass effect. No midline shift. No extra-axial fluid collection. No evidence of acute infarct. No hydrocephalus. ORBITS: No acute abnormality. SINUSES AND MASTOIDS: No acute abnormality. CTA NECK: AORTIC ARCH AND ARCH  VESSELS: Atherosclerotic calcifications are present at the origin of the left subclavian artery without a significant stenosis. Calcifications are present in the distal arch. No aneurysm or dissection is present. CERVICAL CAROTID ARTERIES: Mural calcifications are present along the right common carotid artery. More complex calcifications are present at the right carotid bifurcation. No significant stenosis of greater than 50% is present relative to the more distal vessels. Atherosclerotic calcifications are present at the left carotid bifurcation without significant stenosis. CERVICAL VERTEBRAL ARTERIES: A high-grade stenosis present in the origin of the right vertebral artery. No additional stenosis are present in either vertebral artery in the neck. LUNGS AND MEDIASTINUM: Unremarkable. SOFT TISSUES: A right thyroid  nodule measures 2.5 cm. Esophagus is distended. No mass lesion is present. Bilateral subcutaneous battery packs and control units are placed over the chest. BONES: Multilevel degenerative changes are present in the cervical spine. Ankylosis is present across the disc space at C2-3 and C3-4. Slight degenerative anterolisthesis present at C4-C5. Uncovertebral spurring contributes to foraminal narrowing bilaterally at C5-6 and C6-7. CTA HEAD: ANTERIOR CIRCULATION: Right cavernous ICA stent is patent. Atherosclerotic calcifications are present in the cavernous left ICA without significant stenosis relative to the ICA terminus. High-grade stenosis are present in the proximal A1 segments bilaterally. Migraine stenosis present in the distal right M1 segment. Segmental narrowing is present throughout the ACA and MCA branch vessels. POSTERIOR CIRCULATION: High-grade stenosis are present in the proximal left P1 and P2 segments. A high-grade stenosis is present in the more distal right P2 segment. Marked attenuation of PCA branch vessels is noted, right greater than left. OTHER: No dural venous sinus thrombosis on  this non-dedicated study. IMPRESSION: 1. Migraine, near occlusive, stenosis of the the mid right p2 segment corresponds with the right occipital infarct. 2. High-grade stenosis in the origin of the right vertebral artery. 3. High-grade stenosis in the proximal A1 segments bilaterally, distal right M1 segment and moderate egmental stenoses throughout the ACA and MCA branch vessels. 4. Moderate proximal left P1 and P2 segments. Marked attenuation of PCA branch vessels, right greater than left. Electronically signed by: Lonni Necessary MD 02/14/2024 05:11 PM EDT RP Workstation: [id number]   CT HEAD CODE STROKE WO CONTRAST Result Date: 02/14/2024 EXAM: CT HEAD WITHOUT 02/14/2024 04:51:03 PM TECHNIQUE: CT of the head was performed without the administration of intravenous contrast. Automated exposure control, iterative reconstruction, and/or weight based adjustment of the mA/kV was utilized to reduce the radiation dose to as low as reasonably achievable. COMPARISON: CT head without contrast 02/14/2024 at 10:35 AM. CLINICAL HISTORY: Neuro deficit, acute,  stroke suspected. FINDINGS: BRAIN AND VENTRICLES: Evolving inferior right occipital lobe infarct is noted. Right perianal craniotomy and temporal lobe resection is again noted. A remote infarct in the high medial right frontal lobe is stable. Atrophy and white matter disease, mildly advanced for age is otherwise stable. ORBITS: Lens replacement. SINUSES AND MASTOIDS: Moderate mucosal thickening is present in the maxillary sinuses bilaterally. Bilateral maxillary antrostomies are noted. Minimal fluid is present in the inferior left mastoid air cells. No obstructing nasopharyngeal lesion is present. SOFT TISSUES AND SKULL: No acute skull fracture. No acute soft tissue abnormality. VASCULATURE: Right cavernous internal carotid artery stent is in place. Atherosclerotic changes are present in the cavernous left ICA. IMPRESSION: 1. Evolving inferior right occipital lobe  infarct. 2. Stable remote infarct in the high medial right frontal lobe. 3. Right perianal craniotomy and temporal lobe resection. 4. Right cavernous internal carotid artery stent in place. Atherosclerotic changes in the cavernous left ICA. 5. Mildly advanced atrophy and white matter disease, stable. Electronically signed by: Lonni Necessary MD 02/14/2024 05:01 PM EDT RP Workstation: [id number]   CT HEAD WO CONTRAST Result Date: 02/14/2024 CLINICAL DATA:  88 year old male with left eye vision changes and abnormal gait since yesterday morning. EXAM: CT HEAD WITHOUT CONTRAST TECHNIQUE: Contiguous axial images were obtained from the base of the skull through the vertex without intravenous contrast. RADIATION DOSE REDUCTION: This exam was performed according to the departmental dose-optimization program which includes automated exposure control, adjustment of the mA and/or kV according to patient size and/or use of iterative reconstruction technique. COMPARISON:  Head CT 08/05/2023. FINDINGS: Brain: Chronic right middle cranial fossa resection cavity, subtotal absence of the right temporal lobe. Stable cerebral volume from earlier this year. New confluent hypodensity in the right occipital pole since February (coronal image 15 and series 5, image 26), along the inferior calcarine sulcus. No associated hemorrhage or mass effect. See sagittal image 26. No other acute cortically based infarct identified. Stable encephalomalacia in the distal right ACA territory, right cingulate gyrus (coronal image 43). No superimposed No midline shift, ventriculomegaly, mass effect, acute intracranial hemorrhage. Vascular: Calcified atherosclerosis at the skull base. No suspicious intracranial vascular hyperdensity. Skull: Chronic right frontotemporal craniotomy. No acute osseous abnormality identified. Sinuses/Orbits: Chronic paranasal sinus disease with up to moderate mucoperiosteal thickening bilaterally has not significantly  changed. Tympanic cavities and mastoids remain well aerated. Other: No acute orbit or scalp soft tissue finding. Chronic postoperative changes to both globes. IMPRESSION: 1. Positive for small acute or subacute Right PCA territory infarct affecting the inferior right occipital pole. Correlate for Left visual field deficits. No hemorrhagic transformation or mass effect. 2. Otherwise stable including chronic distal right ACA infarct, chronic right middle cranial fossa resection. Chronic paranasal sinus disease. Electronically Signed   By: VEAR Hurst M.D.   On: 02/14/2024 10:48   CUP PACEART REMOTE DEVICE CHECK Result Date: 01/29/2024 ICD Monthly battery check.  Estimated 4 months. Normal device function. No new alerts. 6 AT/AF episodes, all on 01/13/24 between 14:48 and 16:56, longest 54 min 15 sec, burden 0.3% since 12/29/23, not on OAC due to contraindication with low occurrence per Epic. Follow up as scheduled monthly. MC, CVRS ICD Monthly battery check.  Estimated 4 months. Normal device function. No new alerts. 6 AT/AF episodes, all on 01/13/24 between 14:48 and 16:56, longest 54 min 15 sec, V rates > 100 bpm ~ 35%, burden 0.3% since 12/29/23, not on OAC due to contraindication with low occurrence per Epic. Follow up as scheduled monthly.  MC, CVRS   Subjective: Patient was seen and examined at bedside.  Overnight events noted. Patient reports feeling much better and wants to be discharged.  Neurology is agreeable.  Discharge Exam: Vitals:   02/16/24 0734 02/16/24 1128  BP: (!) 153/69 (!) 147/66  Pulse:    Resp:  16  Temp: 97.9 F (36.6 C) 97.6 F (36.4 C)  SpO2:  96%   Vitals:   02/15/24 2308 02/16/24 0324 02/16/24 0734 02/16/24 1128  BP: (!) 171/58 (!) 186/70 (!) 153/69 (!) 147/66  Pulse: 63 60    Resp: 16 16  16   Temp: 97.9 F (36.6 C) (!) 97.5 F (36.4 C) 97.9 F (36.6 C) 97.6 F (36.4 C)  TempSrc: Oral Oral Oral Oral  SpO2: 97% 97%  96%  Weight:      Height:        General: Pt is  alert, awake, not in acute distress Cardiovascular: RRR, S1/S2 +, no rubs, no gallops Respiratory: CTA bilaterally, no wheezing, no rhonchi Abdominal: Soft, NT, ND, bowel sounds + Extremities: no edema, no cyanosis    The results of significant diagnostics from this hospitalization (including imaging, microbiology, ancillary and laboratory) are listed below for reference.     Microbiology: No results found for this or any previous visit (from the past 240 hours).   Labs: BNP (last 3 results) No results for input(s): BNP in the last 8760 hours. Basic Metabolic Panel: Recent Labs  Lab 02/14/24 1006 02/14/24 1012 02/14/24 1026 02/15/24 0513  NA 135 136  --  135  K 4.2 4.3  --  4.2  CL 105 103  --  103  CO2 20*  --   --  22  GLUCOSE 95 97  --  90  BUN 13 14  --  10  CREATININE 0.74 0.70  --  0.65  CALCIUM  8.9  --   --  8.6*  MG  --   --  2.0 1.8  PHOS  --   --   --  3.9   Liver Function Tests: Recent Labs  Lab 02/14/24 1006 02/15/24 0513  AST 23 21  ALT 18 16  ALKPHOS 126 116  BILITOT 0.6 0.7  PROT 7.5 6.4*  ALBUMIN 4.0 3.4*   No results for input(s): LIPASE, AMYLASE in the last 168 hours. No results for input(s): AMMONIA in the last 168 hours. CBC: Recent Labs  Lab 02/14/24 1006 02/14/24 1012 02/15/24 0513  WBC 6.1  --  5.4  NEUTROABS 3.2  --   --   HGB 12.9* 12.6* 12.0*  HCT 36.7* 37.0* 33.5*  MCV 93.4  --  91.5  PLT 209  --  186   Cardiac Enzymes: No results for input(s): CKTOTAL, CKMB, CKMBINDEX, TROPONINI in the last 168 hours. BNP: Invalid input(s): POCBNP CBG: Recent Labs  Lab 02/14/24 1000 02/16/24 0902  GLUCAP 90 106*   D-Dimer No results for input(s): DDIMER in the last 72 hours. Hgb A1c Recent Labs    02/14/24 1006  HGBA1C 5.1   Lipid Profile Recent Labs    02/15/24 0513  CHOL 184  HDL 59  LDLCALC 113*  TRIG 59  CHOLHDL 3.1   Thyroid  function studies No results for input(s): TSH, T4TOTAL,  T3FREE, THYROIDAB in the last 72 hours.  Invalid input(s): FREET3 Anemia work up No results for input(s): VITAMINB12, FOLATE, FERRITIN, TIBC, IRON, RETICCTPCT in the last 72 hours. Urinalysis    Component Value Date/Time   COLORURINE YELLOW 02/14/2024 1135  APPEARANCEUR CLEAR 02/14/2024 1135   LABSPEC 1.011 02/14/2024 1135   PHURINE 7.0 02/14/2024 1135   GLUCOSEU NEGATIVE 02/14/2024 1135   HGBUR NEGATIVE 02/14/2024 1135   BILIRUBINUR NEGATIVE 02/14/2024 1135   KETONESUR NEGATIVE 02/14/2024 1135   PROTEINUR NEGATIVE 02/14/2024 1135   UROBILINOGEN 0.2 04/10/2013 0910   NITRITE NEGATIVE 02/14/2024 1135   LEUKOCYTESUR NEGATIVE 02/14/2024 1135   Sepsis Labs Recent Labs  Lab 02/14/24 1006 02/15/24 0513  WBC 6.1 5.4   Microbiology No results found for this or any previous visit (from the past 240 hours).   Time coordinating discharge: Over 30 minutes  SIGNED:   Darcel Dawley, MD  [facility] 02/16/2024, 2:30 PM Pager   If 7PM-7AM, please contact night-coverage

## 2024-02-16 NOTE — Progress Notes (Signed)
 Pt questioned 22:00 dose of lacosamide  200mg  earlier in shift, asking who ordered it? Was it the neurologist? Informed pt that the ED MD ordered it on 8/30 but that the neurologist at the hospital is aware it is ordered. Also informed pt that the same dose & frequency is listed in his PTA meds so it appears that's the dose he was taking before admission. Pt concerned that it is too high of a dose & states that is not the dose he takes at [facility]. However, in the same breath said he wife keeps up with his meds. Pt did end up taking the 22:00 dose after speaking to wife.   Pt refused 4:00 NIH. Pt stated he has had a seizure & a stroke & he needs his sleep! Pt inquired who ordered it. Pt informed it was ordered by the ED MD & his attending MD but that the neurologist here at the hospital was aware.    Pt stated he wants to speak to the neurologist this AM as he is unhappy with the care he has been receiving

## 2024-02-16 NOTE — TOC Initial Note (Addendum)
 Transition of Care [facility]) - Initial/Assessment Note    Patient Details  Name: Cameron Potts MRN: [id number] Date of Birth: 06/16/1936  [facility]) CM/SW Contact:    Isaiah Public, LCSWA Phone Number: 02/16/2024, 1:39 PM  Clinical Narrative:                    Patients spouse confirms patient comes from [facility]. Patients plan is to return back to [facility] when medically ready. Patients spouse okay with CSW sending  HHPT/OT orders if needed to facility.MD informed CSW that patient medically ready for dc. CSW awaiting call back from [facility] to confirm that patient can return back today. CSW informed MD.  Update- CSW received call back from [place] with [facility] who informed CSW that they are unable to accept patient back today. Facility does not have provider in house today or Child psychotherapist. CSW informed MD and patients spouse. CSW will follow up tomorrow with facility.  Update- [place] with [facility] gave CSW a callback and informed CSW that they can now accept patient back today. CSW informed MD and patients spouse.     Patient Goals and CMS Choice            Expected Discharge Plan and Services         Expected Discharge Date: 02/16/24                                    Prior Living Arrangements/Services                       Activities of Daily Living   ADL Screening (condition at time of admission) Independently performs ADLs?: Yes (appropriate for developmental age) Is the patient deaf or have difficulty hearing?: No Does the patient have difficulty seeing, even when wearing glasses/contacts?: Yes Does the patient have difficulty concentrating, remembering, or making decisions?: No  Permission Sought/Granted                  Emotional Assessment              Admission diagnosis:  Acute CVA (cerebrovascular accident) [facility]) [I63.9] Cerebrovascular accident (CVA), unspecified mechanism (HCC)  [I63.9] Patient Active Problem List   Diagnosis Date Noted   Acute CVA (cerebrovascular accident) (HCC) 02/14/2024   Thyroid  nodule 01/19/2022   Benign prostatic hyperplasia with urinary frequency 07/05/2020   Pain 04/21/2019   Trigger middle finger of right hand 04/21/2019   Breakthrough seizure (HCC) 01/30/2019   Intractable seizures (HCC) 01/30/2019   Altered mental status 11/02/2018   Hypercoagulability due to atrial fibrillation (HCC) 03/25/2018   Primary open-angle glaucoma, bilateral, severe stage 03/17/2018   ICD (implantable cardioverter-defibrillator) in place 03/13/2018   Parkinsonian syndrome associated with symptomatic orthostatic hypotension (HCC) 03/13/2018   Refractory epilepsy (HCC) 03/13/2018   Status post VNS (vagus nerve stimulator) placement 03/13/2018   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   Neuropathy, peripheral 12/14/2017   Hyponatremia 07/12/2017   Presence of intraocular lens 04/23/2017   Status epilepticus (HCC) 01/05/2017   Chronic atrial fibrillation 01/04/2017   Adjustment disorder with anxious mood 03/25/2014   Age-related nuclear cataract of both eyes 03/25/2014   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 [place] [address] Phone: [phone] Fax: [phone]     Social Drivers of Health (SDOH) Social History: SDOH Screenings   Food Insecurity: No Food Insecurity (02/14/2024)  Housing: Low Risk  (02/14/2024)  Transportation Needs: No Transportation Needs (02/14/2024)  Utilities: Not At Risk (02/14/2024)   Alcohol Screen: Low Risk  (05/27/2018)  Depression (PHQ2-9): Low Risk  (08/25/2023)  Financial Resource Strain: Low Risk  (04/15/2018)  Physical Activity: Sufficiently Active (04/15/2018)  Social Connections: Moderately Isolated (02/14/2024)  Stress: No Stress Concern Present (04/15/2018)  Tobacco Use: Low Risk  (02/14/2024)   SDOH Interventions:     Readmission Risk Interventions     No data to display

## 2024-02-16 NOTE — NC FL2 (Signed)
 [place]  MEDICAID FL2 LEVEL OF CARE FORM     IDENTIFICATION  Patient Name: Cameron Potts Birthdate: 21-Jun-1935 Sex: male Admission Date (Current Location): 02/14/2024  [facility] and [place] Number:  Producer, television/film/video and Address:  [facility] [place], [address] [place], [place] [address]      Provider Number: [id number]  Attending Physician Name and Address:  Leotis Bogus, MD  Relative Name and Phone Number:  Neithan Day (spouse)    Current Level of Care: Hospital Recommended Level of Care: Assisted Living Facility Prior Approval Number:    Date Approved/Denied:   PASRR Number:    Discharge Plan: Other (Comment) ([facility])    Current Diagnoses: Patient Active Problem List   Diagnosis Date Noted   Acute CVA (cerebrovascular accident) (HCC) 02/14/2024   Thyroid  nodule 01/19/2022   Benign prostatic hyperplasia with urinary frequency 07/05/2020   Pain 04/21/2019   Trigger middle finger of right hand 04/21/2019   Breakthrough seizure (HCC) 01/30/2019   Intractable seizures (HCC) 01/30/2019   Altered mental status 11/02/2018   Hypercoagulability due to atrial fibrillation (HCC) 03/25/2018   Primary open-angle glaucoma, bilateral, severe stage 03/17/2018   ICD (implantable cardioverter-defibrillator) in place 03/13/2018   Parkinsonian syndrome associated with symptomatic orthostatic hypotension (HCC) 03/13/2018   Refractory epilepsy (HCC) 03/13/2018   Status post VNS (vagus nerve stimulator) placement 03/13/2018   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   Neuropathy, peripheral 12/14/2017   Hyponatremia 07/12/2017   Presence of intraocular lens 04/23/2017   Status epilepticus (HCC) 01/05/2017   Chronic atrial fibrillation 01/04/2017   Adjustment disorder with anxious mood 03/25/2014   Age-related nuclear cataract of both eyes 03/25/2014   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   Hyperlipidemia 03/25/2014   Carcinoma in situ  of prostate 02/08/2014   Other localized visual field defect, bilateral 02/02/2014   Abnormality of gait 12/21/2013   Complex partial seizure (HCC) 04/11/2013   Seizure disorder (HCC) 04/10/2013   Osteopenia 12/04/2011   Vitamin D  deficiency 12/04/2011    Orientation RESPIRATION BLADDER Height & Weight     Self, Time, Situation, Place  Normal Continent Weight: 170 lb (77.1 kg) Height:  5' 8 (172.7 cm)  BEHAVIORAL SYMPTOMS/MOOD NEUROLOGICAL BOWEL NUTRITION STATUS      Continent Diet (Heart Healthy)  AMBULATORY STATUS COMMUNICATION OF NEEDS Skin   Limited Assist   Other (Comment) (WDL,Wound/Incision LDAs)                       Personal Care Assistance Level of Assistance  Bathing, Dressing, Feeding Bathing Assistance: Limited assistance Feeding assistance: Limited assistance Dressing Assistance: Limited assistance     Functional Limitations Info  Sight, Speech, Hearing Sight Info: Impaired Hearing Info: Adequate Speech Info: Adequate    SPECIAL CARE FACTORS FREQUENCY  PT (By licensed PT), OT (By licensed OT)     PT Frequency: Please see HH PT orders OT Frequency: Please see HH OT orders            Contractures Contractures Info: Not present    Additional Factors Info  Code Status, Allergies, Psychotropic Code Status Info: DNR Allergies Info: Depakote (divalproex Sodium) Psychotropic Info: levETIRAcetam  (KEPPRA ) undiluted injection 1,500 mg every 12 hours         Current Medications (02/16/2024):  This is the current hospital active medication list Current Facility-Administered Medications  Medication Dose Route Frequency Provider Last Rate Last Admin   acetaminophen  (TYLENOL ) tablet 650 mg  650  mg Oral Q4H PRN Leotis Bogus, MD       Or   acetaminophen  (TYLENOL ) 160 MG/5ML solution 650 mg  650 mg Per Tube Q4H PRN Leotis Bogus, MD       Or   acetaminophen  (TYLENOL ) suppository 650 mg  650 mg Rectal Q4H PRN Leotis Bogus, MD       ALPRAZolam   (XANAX ) tablet 0.25 mg  0.25 mg Oral BID PRN Zackowski, Scott, MD       aspirin  EC tablet 81 mg  81 mg Oral Daily Toberman, Stevi W, NP   81 mg at 02/16/24 0935   atorvastatin  (LIPITOR) tablet 20 mg  20 mg Oral Daily Leotis Bogus, MD   20 mg at 02/16/24 9063   clopidogrel  (PLAVIX ) tablet 75 mg  75 mg Oral Daily Toberman, Stevi W, NP   75 mg at 02/16/24 9063   finasteride  (PROSCAR ) tablet 5 mg  5 mg Oral q morning Leotis Bogus, MD   5 mg at 02/16/24 9063   flecainide  (TAMBOCOR ) tablet 50 mg  50 mg Oral BID Zackowski, Scott, MD   50 mg at 02/16/24 9063   lacosamide  (VIMPAT ) tablet 200 mg  200 mg Oral BID Zackowski, Scott, MD   200 mg at 02/16/24 0935   levETIRAcetam  (KEPPRA ) undiluted injection 1,500 mg  1,500 mg Intravenous Q12H Jerri Pfeiffer, MD   1,500 mg at 02/16/24 0935   LORazepam  (ATIVAN ) 2 MG/ML concentrated solution 1-2 mg  1-2 mg Oral BID PRN Zackowski, Scott, MD       NOREEN ON 02/17/2024] phenytoin  (DILANTIN ) ER capsule 200 mg  200 mg Oral Daily Jerri Pfeiffer, MD       And   phenytoin  (DILANTIN ) ER capsule 200 mg  200 mg Oral QHS Jerri Pfeiffer, MD       senna-docusate (Senokot-S) tablet 1 tablet  1 tablet Oral QHS PRN Leotis Bogus, MD       sodium chloride  tablet 2 g  2 g Oral TID Leotis Bogus, MD   2 g at 02/16/24 0935   tamsulosin  (FLOMAX ) capsule 0.4 mg  0.4 mg Oral q AM Zackowski, Scott, MD   0.4 mg at 02/16/24 0936     TAKE these medications     acetaminophen  325 MG tablet Commonly known as: TYLENOL  Take 650 mg by mouth every 6 (six) hours as needed for mild pain (pain score 1-3) or moderate pain (pain score 4-6).    ALPRAZolam  0.25 MG tablet Commonly known as: XANAX  Take 1 tablet (0.25 mg total) by mouth 2 (two) times daily as needed for anxiety.    aspirin  EC 81 MG tablet Take 1 tablet (81 mg total) by mouth daily. Swallow whole. Start taking on: February 17, 2024    atorvastatin  20 MG tablet Commonly known as: LIPITOR Take 1 tablet (20 mg total) by mouth  daily. Start taking on: February 17, 2024    brimonidine -timolol  0.2-0.5 % ophthalmic solution Commonly known as: COMBIGAN  Place 1 drop into both eyes every 12 (twelve) hours.    camphor-menthol lotion Commonly known as: SARNA Apply 1 Application topically every 8 (eight) hours as needed for itching.    cetirizine  10 MG tablet Commonly known as: ZYRTEC  Take 10 mg by mouth as needed for allergies.    cholecalciferol 10 MCG (400 UNIT) Tabs tablet Commonly known as: VITAMIN D3 Take 400 Units by mouth 2 (two) times daily.    clobetasol  cream 0.05 % Commonly known as: TEMOVATE  Apply 1 application  topically 2 (two) times  daily as needed (rash).    clopidogrel  75 MG tablet Commonly known as: PLAVIX  Take 1 tablet (75 mg total) by mouth daily for 21 days. Start taking on: February 17, 2024    econazole nitrate 1 % cream Apply 1 Application topically 2 (two) times daily as needed.    finasteride  5 MG tablet Commonly known as: PROSCAR  Take 5 mg by mouth every morning.    flecainide  50 MG tablet Commonly known as: TAMBOCOR  Take 50 mg by mouth 2 (two) times daily.    lacosamide  200 MG Tabs tablet Commonly known as: VIMPAT  Take 1 tablet (200 mg total) by mouth 2 (two) times daily.    latanoprost  0.005 % ophthalmic solution Commonly known as: XALATAN  Place 1 drop into both eyes at bedtime.    levETIRAcetam  750 MG 24 hr tablet Commonly known as: KEPPRA  XR Take 2,250 mg by mouth every evening.    LORazepam  2 MG/ML concentrated solution Commonly known as: ATIVAN  Take 0.5 mLs (1 mg total) by mouth as needed for seizure or anxiety. What changed:  how much to take when to take this reasons to take this    phenytoin  200 MG ER capsule Commonly known as: DILANTIN  Take 1 capsule (200 mg total) by mouth daily AND 1 capsule (200 mg total) at bedtime. What changed:  medication strength See the new instructions. Another medication with the same name was removed. Continue taking  this medication, and follow the directions you see here.    polyethylene glycol 17 g packet Commonly known as: MIRALAX  / GLYCOLAX  Take 17 g by mouth every other day.    sodium chloride  1 g tablet Take 2 tablets (2 g total) by mouth 3 (three) times daily.    talc powder Apply 1 Application topically 2 (two) times daily.    tamsulosin  0.4 MG Caps capsule Commonly known as: FLOMAX  Take 0.4 mg by mouth in the morning.    triamcinolone cream 0.1 % Commonly known as: KENALOG Apply 1 Application topically 2 (two) times daily as needed (Rash).    Tums Smoothies 750 MG chewable tablet Generic drug: calcium  carbonate Chew 2 tablets by mouth 4 (four) times daily as needed for heartburn.           Relevant Imaging Results:  Relevant Lab Results:   Additional Information [id number]  Isaiah Public, LCSWA

## 2024-02-16 NOTE — Progress Notes (Signed)
 Discharge instructions given to both the pt & his wife mrs jane. All questions answered. Pt's iv & telemetry removed. All belongings gathered & sent home. RN called & gave report to nurse Ivy at [facility]. Pt was sent with AVS as well as his signed dnr form. Evangeline Utley R, RN

## 2024-02-16 NOTE — Progress Notes (Signed)
 Mobility Specialist Progress Note;   02/16/24 1033  Mobility  Activity Pivoted/transferred from bed to chair  Level of Assistance Contact guard assist, steadying assist  Assistive Device Front wheel walker  Distance Ambulated (ft) 5 ft  Activity Response Tolerated well  Mobility Referral Yes  Mobility visit 1 Mobility  Mobility Specialist Start Time (ACUTE ONLY) 1033  Mobility Specialist Stop Time (ACUTE ONLY) 1041  Mobility Specialist Time Calculation (min) (ACUTE ONLY) 8 min   Pt agreeable to mobility. Required no physical assistance w/ bed mobility. MinG to safely transfer from bed to chair. VC required for safety awareness. Pt left in chair with all needs met, alarm on. NT notified.   Lauraine Erm Mobility Specialist Please contact via SecureChat or [employer] [phone]

## 2024-02-17 ENCOUNTER — Encounter: Payer: Self-pay | Admitting: Nurse Practitioner

## 2024-02-17 ENCOUNTER — Non-Acute Institutional Stay: Payer: Self-pay | Admitting: Orthopedic Surgery

## 2024-02-17 DIAGNOSIS — R0989 Other specified symptoms and signs involving the circulatory and respiratory systems: Secondary | ICD-10-CM

## 2024-02-17 DIAGNOSIS — G40909 Epilepsy, unspecified, not intractable, without status epilepticus: Secondary | ICD-10-CM | POA: Diagnosis not present

## 2024-02-17 DIAGNOSIS — Z8673 Personal history of transient ischemic attack (TIA), and cerebral infarction without residual deficits: Secondary | ICD-10-CM

## 2024-02-17 DIAGNOSIS — H53452 Other localized visual field defect, left eye: Secondary | ICD-10-CM

## 2024-02-17 NOTE — Progress Notes (Unsigned)
 Called and reported CXR for possible pneumonia, vitals stable, wants to start antibiotics. Started Doxycycline  100mg  bid x7 days

## 2024-02-17 NOTE — Progress Notes (Unsigned)
 [facility] Room Number: [id number] Place of Service:  ALF [phone]) Provider:  Gil, Jackeline Gutknecht NP  Charlanne Fredia CROME, MD  Patient Care Team: Charlanne Fredia CROME, MD as PCP - General (Internal Medicine) Francyne Headland, MD as PCP - Cardiology (Cardiology) Roz Anes, MD as Consulting Physician (Ophthalmology) Gregg Lek, MD as Consulting Physician (Neurology)  Extended Emergency Contact Information Primary Emergency Contact: Netherton,[name] Address: [address]          [place], [place] [address] [place] Home Phone: [phone] Mobile Phone: [phone] Relation: Spouse Secondary Emergency Contact: Fayette MADISON Ruth Home Phone: [phone] Mobile Phone: [phone] Relation: Son  Code Status:  DNR Goals of care: Advanced Directive information    02/17/2024    4:32 PM  Advanced Directives  Does Patient Have a Medical Advance Directive? Yes  Type of Advance Directive Living will;Out of facility DNR (pink MOST or yellow form);Healthcare Power of Attorney  Does patient want to make changes to medical advance directive? No - Patient declined  Copy of Healthcare Power of Attorney in Chart? Yes - validated most recent copy scanned in chart (See row information)  Pre-existing out of facility DNR order (yellow form or pink MOST form) Pink Most/Yellow Form available - Physician notified to receive inpatient order     Chief Complaint  Patient presents with   Acute Visit    Chocking / Possible aspiration     HPI:  Pt is a 88 y.o. male seen today for an acute visit for    Past Medical History:  Diagnosis Date   Abnormality of gait 12/21/2013   Atrial fibrillation (HCC)    Blind left eye    Carcinoma in situ of prostate    Cardiac arrest (HCC)    Glaucoma    Hypercholesteremia    Hyperlipidemia    Metabolic encephalopathy    Mild left ventricular systolic dysfunction    Orthostatic hypotension    Presence of other  specified functional implants    PSA elevation    Right frontal lobe lesion    Seizures (HCC)    Tinea pedis    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grover)    Urinary urgency    Past Surgical History:  Procedure Laterality Date   CARDIAC DEFIBRILLATOR PLACEMENT     cataract surgery     COLONOSCOPY     CRANIOTOMY Right    right anterior temporal resection   defibrillator replaced  08/16/2014   IMPLANTATION VAGAL NERVE STIMULATOR  11/16/2014   NASAL SINUS SURGERY     TONSILLECTOMY     age 66   VASECTOMY  06/18/1983    Allergies  Allergen Reactions   Depakote [Divalproex Sodium] Other (See Comments)    Arthralgias     Allergies as of 02/17/2024       Reactions   Depakote [divalproex Sodium] Other (See Comments)   Arthralgias        Medication List        Accurate as of February 17, 2024  4:33 PM. If you have any questions, ask your nurse or doctor.          acetaminophen  325 MG tablet Commonly known as: TYLENOL  Take 650 mg by mouth every 6 (six) hours as needed for mild pain (pain score 1-3) or moderate pain (pain score 4-6).   aspirin  EC 81 MG tablet Take 1 tablet (81 mg total) by mouth daily. Swallow whole.   atorvastatin  20 MG tablet Commonly known  as: LIPITOR Take 1 tablet (20 mg total) by mouth daily.   brimonidine -timolol  0.2-0.5 % ophthalmic solution Commonly known as: COMBIGAN  Place 1 drop into both eyes every 12 (twelve) hours.   camphor-menthol lotion Commonly known as: SARNA Apply 1 Application topically every 8 (eight) hours as needed for itching.   cetirizine  10 MG tablet Commonly known as: ZYRTEC  Take 10 mg by mouth as needed for allergies.   cholecalciferol 10 MCG (400 UNIT) Tabs tablet Commonly known as: VITAMIN D3 Take 400 Units by mouth 2 (two) times daily.   clobetasol  cream 0.05 % Commonly known as: TEMOVATE  Apply 1 application  topically 2 (two) times daily as needed (rash).   clopidogrel  75 MG tablet Commonly known as:  PLAVIX  Take 1 tablet (75 mg total) by mouth daily for 21 days.   econazole nitrate 1 % cream Apply 1 Application topically 2 (two) times daily as needed.   finasteride  5 MG tablet Commonly known as: PROSCAR  Take 5 mg by mouth every morning.   flecainide  50 MG tablet Commonly known as: TAMBOCOR  Take 50 mg by mouth 2 (two) times daily.   lacosamide  200 MG Tabs tablet Commonly known as: VIMPAT  Take 1 tablet (200 mg total) by mouth 2 (two) times daily.   latanoprost  0.005 % ophthalmic solution Commonly known as: XALATAN  Place 1 drop into both eyes at bedtime.   levETIRAcetam  750 MG 24 hr tablet Commonly known as: KEPPRA  XR Take 2,250 mg by mouth every evening.   LORazepam  2 MG/ML concentrated solution Commonly known as: ATIVAN  Take 2 mg by mouth every 12 (twelve) hours as needed for anxiety. What changed: Another medication with the same name was changed. Make sure you understand how and when to take each.   LORazepam  2 MG/ML concentrated solution Commonly known as: ATIVAN  Take 0.5 mLs (1 mg total) by mouth as needed for seizure or anxiety. What changed:  how much to take when to take this reasons to take this   phenytoin  100 MG ER capsule Commonly known as: DILANTIN  Take 100 mg by mouth 2 (two) times daily. 2 Capsules in the evening   phenytoin  100 MG ER capsule Commonly known as: DILANTIN  Take 100 mg by mouth in the morning.   phenytoin  200 MG ER capsule Commonly known as: DILANTIN  Take 1 capsule (200 mg total) by mouth daily AND 1 capsule (200 mg total) at bedtime.   polyethylene glycol 17 g packet Commonly known as: MIRALAX  / GLYCOLAX  Take 17 g by mouth every other day.   sodium chloride  1 g tablet Take 2 tablets (2 g total) by mouth 3 (three) times daily.   talc powder Apply 1 Application topically 2 (two) times daily.   tamsulosin  0.4 MG Caps capsule Commonly known as: FLOMAX  Take 0.4 mg by mouth in the morning.   triamcinolone cream 0.1 % Commonly  known as: KENALOG Apply 1 Application topically 2 (two) times daily as needed (Rash).   Tums Smoothies 750 MG chewable tablet Generic drug: calcium  carbonate Chew 2 tablets by mouth 4 (four) times daily as needed for heartburn.        Review of Systems  Immunization History  Administered Date(s) Administered   Fluad Quad(high Dose 65+) 03/22/2022, 04/08/2023   Fluad Trivalent(High Dose 65+) 04/08/2023   INFLUENZA, HIGH DOSE SEASONAL PF 04/07/2020   Influenza, Seasonal, Injecte, Preservative Fre 02/22/2010   Influenza,inj,Quad PF,6+ Mos 04/07/2018, 04/15/2019   Influenza-Unspecified 03/17/2013, 02/24/2014, 02/16/2015, 03/21/2016, 03/17/2017, 04/07/2018, 03/21/2021   Moderna SARS-COV2 Booster Vaccination 01/11/2021,  03/28/2021, 04/08/2023   Moderna Sars-Covid-2 Vaccination 06/28/2019, 07/27/2019, 05/01/2020, 04/22/2022, 10/09/2022   Pneumococcal Conjugate-13 07/05/2013   Pneumococcal Polysaccharide-23 06/17/2006   Td 06/18/2007   Tdap 12/07/2015, 09/17/2020   Zoster Recombinant(Shingrix) 12/04/2016   Zoster, Live 10/06/2008   Pertinent  Health Maintenance Due  Topic Date Due   INFLUENZA VACCINE  01/16/2024      09/17/2022    3:12 PM 10/21/2022    1:58 PM 03/03/2023    9:30 AM 03/24/2023    2:10 PM 08/25/2023    2:26 PM  Fall Risk  Falls in the past year? 1 1 0 1 1  Was there an injury with Fall? 1 1 0 1 0  Fall Risk Category Calculator 3 3 0 2 1  Patient at Risk for Falls Due to History of fall(s);Impaired mobility;Impaired balance/gait Impaired balance/gait;Impaired mobility;History of fall(s) No Fall Risks History of fall(s);Impaired balance/gait;Impaired mobility Impaired balance/gait;Impaired mobility  Fall risk Follow up Falls evaluation completed Falls evaluation completed   Falls evaluation completed   Functional Status Survey:    Vitals:   02/17/24 1601  BP: (!) 168/90  Pulse: 61  Resp: 16  Temp: (!) 97 F (36.1 C)  SpO2: 96%  Weight: 213 lb (96.6 kg)   Height: 5' 8 (1.727 m)   Body mass index is 32.39 kg/m. Physical Exam  Labs reviewed: Recent Labs    10/17/23 0000 02/14/24 1006 02/14/24 1012 02/14/24 1026 02/15/24 0513  NA 138 135 136  --  135  K 4.6 4.2 4.3  --  4.2  CL 103 105 103  --  103  CO2 25* 20*  --   --  22  GLUCOSE  --  95 97  --  90  BUN 12 13 14   --  10  CREATININE 0.7 0.74 0.70  --  0.65  CALCIUM  8.9 8.9  --   --  8.6*  MG  --   --   --  2.0 1.8  PHOS  --   --   --   --  3.9   Recent Labs    03/04/23 0000 02/14/24 1006 02/15/24 0513  AST 25 23 21   ALT 18 18 16   ALKPHOS 148* 126 116  BILITOT  --  0.6 0.7  PROT  --  7.5 6.4*  ALBUMIN 4.2 4.0 3.4*   Recent Labs    08/05/23 1457 02/14/24 1006 02/14/24 1012 02/15/24 0513  WBC 6.4 6.1  --  5.4  NEUTROABS  --  3.2  --   --   HGB 11.6* 12.9* 12.6* 12.0*  HCT 33.9* 36.7* 37.0* 33.5*  MCV 94.2 93.4  --  91.5  PLT 209 209  --  186   Lab Results  Component Value Date   TSH 4.44 10/17/2023   Lab Results  Component Value Date   HGBA1C 5.1 02/14/2024   Lab Results  Component Value Date   CHOL 184 02/15/2024   HDL 59 02/15/2024   LDLCALC 113 (H) 02/15/2024   TRIG 59 02/15/2024   CHOLHDL 3.1 02/15/2024    Significant Diagnostic Results in last 30 days:  ECHOCARDIOGRAM COMPLETE Result Date: 02/16/2024    ECHOCARDIOGRAM REPORT   Patient Name:   Jama Elex Candy III Date of Exam: 02/16/2024 Medical Rec #:  [id number]              Height:       68.0 in Accession #:    [id number]  Weight:       170.0 lb Date of Birth:  01/08/1936              BSA:          1.907 m Patient Age:    88 years               BP:           153/69 mmHg Patient Gender: M                      HR:           64 bpm. Exam Location:  Inpatient Procedure: 2D Echo, Cardiac Doppler and Color Doppler (Both Spectral and Color            Flow Doppler were utilized during procedure). Indications:    Stroke I63.9  History:        Patient has prior history of Echocardiogram  examinations, most                 recent 03/21/2022. Arrythmias:Atrial Fibrillation.  Sonographer:    Jayson Gaskins Referring Phys: [phone] PARDEEP KHATRI IMPRESSIONS  1. Left ventricular ejection fraction, by estimation, is 60 to 65%. The left ventricle has normal function. The left ventricle has no regional wall motion abnormalities. Left ventricular diastolic parameters were normal.  2. Right ventricular systolic function is normal. The right ventricular size is normal.  3. Left atrial size was mild to moderately dilated.  4. The mitral valve is normal in structure. No evidence of mitral valve regurgitation. No evidence of mitral stenosis.  5. The aortic valve is normal in structure. Aortic valve regurgitation is not visualized. No aortic stenosis is present.  6. The inferior vena cava is normal in size with greater than 50% respiratory variability, suggesting right atrial pressure of 3 mmHg. Comparison(s): No significant change from prior study. Prior images reviewed side by side. FINDINGS  Left Ventricle: Left ventricular ejection fraction, by estimation, is 60 to 65%. The left ventricle has normal function. The left ventricle has no regional wall motion abnormalities. The left ventricular internal cavity size was normal in size. There is  no left ventricular hypertrophy. Left ventricular diastolic parameters were normal. Right Ventricle: The right ventricular size is normal. No increase in right ventricular wall thickness. Right ventricular systolic function is normal. Left Atrium: Left atrial size was mild to moderately dilated. Right Atrium: Right atrial size was normal in size. Pericardium: There is no evidence of pericardial effusion. Mitral Valve: The mitral valve is normal in structure. No evidence of mitral valve regurgitation. No evidence of mitral valve stenosis. Tricuspid Valve: The tricuspid valve is normal in structure. Tricuspid valve regurgitation is not demonstrated. No evidence of tricuspid  stenosis. Aortic Valve: The aortic valve is normal in structure. Aortic valve regurgitation is not visualized. No aortic stenosis is present. Aortic valve mean gradient measures 4.0 mmHg. Aortic valve peak gradient measures 6.7 mmHg. Aortic valve area, by VTI measures 2.84 cm. Pulmonic Valve: The pulmonic valve was not well visualized. Pulmonic valve regurgitation is not visualized. No evidence of pulmonic stenosis. Aorta: The aortic root is normal in size and structure. Venous: The inferior vena cava is normal in size with greater than 50% respiratory variability, suggesting right atrial pressure of 3 mmHg. IAS/Shunts: No atrial level shunt detected by color flow Doppler. Additional Comments: A device lead is visualized in the right ventricle.  LEFT VENTRICLE PLAX 2D LVIDd:  3.80 cm   Diastology LVIDs:         3.00 cm   LV e' medial:    8.27 cm/s LV PW:         1.00 cm   LV E/e' medial:  12.3 LV IVS:        0.90 cm   LV e' lateral:   11.50 cm/s LVOT diam:     1.90 cm   LV E/e' lateral: 8.9 LV SV:         78 LV SV Index:   41 LVOT Area:     2.84 cm  RIGHT VENTRICLE RV S prime:     11.10 cm/s TAPSE (M-mode): 2.5 cm LEFT ATRIUM             Index        RIGHT ATRIUM           Index LA Vol (A2C):   68.6 ml 35.97 ml/m  RA Area:     18.80 cm LA Vol (A4C):   45.2 ml 23.70 ml/m  RA Volume:   49.00 ml  25.69 ml/m LA Biplane Vol: 60.0 ml 31.46 ml/m  AORTIC VALVE AV Area (Vmax):    2.84 cm AV Area (Vmean):   3.46 cm AV Area (VTI):     2.84 cm AV Vmax:           129.00 cm/s AV Vmean:          92.700 cm/s AV VTI:            0.274 m AV Peak Grad:      6.7 mmHg AV Mean Grad:      4.0 mmHg LVOT Vmax:         129.00 cm/s LVOT Vmean:        113.000 cm/s LVOT VTI:          0.274 m LVOT/AV VTI ratio: 1.00  AORTA Ao Root diam: 3.20 cm MITRAL VALVE MV Area (PHT): 3.39 cm     SHUNTS MV Decel Time: 224 msec     Systemic VTI:  0.27 m MV E velocity: 102.00 cm/s  Systemic Diam: 1.90 cm Mihai Croitoru MD Electronically signed  by Jerel Balding MD Signature Date/Time: 02/16/2024/2:13:46 PM    Final    Overnight EEG with video Result Date: 02/15/2024 Shelton Arlin KIDD, MD     02/16/2024  9:28 AM Patient Name: Ryun Velez III MRN: [id number] Epilepsy Attending: Arlin KIDD Shelton Referring Physician/Provider: Denna Mimi ORN, NP Duration: 02/14/2024 1754 to 02/15/2024 1754 Patient history:  88 y.o. 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 EEG to evaluate for seizure Level of alertness: Awake, asleep AEDs during EEG study: LEV, LCM, PHT Technical aspects: This EEG study was done with scalp electrodes positioned according to the 10-20 International system of electrode placement. Electrical activity was reviewed with band pass filter of 1-70Hz , sensitivity of 7 uV/mm, display speed of 65mm/sec with a 60Hz  notched filter applied as appropriate. EEG data were recorded continuously and digitally stored.  Video monitoring was available and reviewed as appropriate. Description: The posterior dominant rhythm consists of 7.5 Hz activity of moderate voltage (  25-35 uV) seen predominantly in posterior head regions, symmetric and reactive to eye opening and eye closing. Sleep was characterized by vertex waves, sleep spindles (12 to 14 Hz), maximal frontocentral region.  EEG showed continuous polymorphic 3 to 6 Hz theta-delta slowing in right temporal region consistent with breach artifact. Spikes were noted in right anterior temporal region, predominantly during sleep. Intermittent generalized 3-5hz  theta-delta slowing was also noted. Hyperventilation and photic  stimulation were not performed.   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 Additionally there is cortical dysfunction arising from right temporal region consistent with underlying craniotomy. Lastly there was mild diffuse encephalopathy. No seizures were seen throughout the recording. Priyanka MALVA Krebs   CT ANGIO HEAD NECK W WO CM (CODE STROKE) Result Date: 02/14/2024 EXAM: CT HEAD WITHOUT CTA HEAD AND NECK WITH AND WITHOUT 02/14/2024 04:59:55 PM TECHNIQUE: CTA of the head and neck was performed with and without the administration of intravenous contrast. Noncontrast CT of the head with reconstructed 2-D images are also provided for review. Multiplanar 2D and/or 3D reformatted images are provided for review. Automated exposure control, iterative reconstruction, and/or weight based adjustment of the mA/kV was utilized to reduce the radiation dose to as low as reasonably achievable. COMPARISON: CT head without contrast 02/14/24. CLINICAL HISTORY: Neuro deficit, acute, stroke suspected. FINDINGS: CT HEAD: BRAIN AND VENTRICLES: No acute intracranial hemorrhage. No mass effect. No midline shift. No extra-axial fluid collection. No evidence of acute infarct. No hydrocephalus. ORBITS: No acute abnormality. SINUSES AND MASTOIDS: No acute abnormality. CTA NECK: AORTIC ARCH AND ARCH VESSELS: Atherosclerotic calcifications are present at the origin of the left subclavian artery without a significant stenosis. Calcifications are present in the distal arch. No aneurysm or dissection is present. CERVICAL CAROTID ARTERIES: Mural calcifications are present along the right common carotid artery. More complex calcifications are present at the right carotid bifurcation. No significant stenosis of greater than 50% is present  relative to the more distal vessels. Atherosclerotic calcifications are present at the left carotid bifurcation without significant stenosis. CERVICAL VERTEBRAL ARTERIES: A high-grade stenosis present in the origin of the right vertebral artery. No additional stenosis are present in either vertebral artery in the neck. LUNGS AND MEDIASTINUM: Unremarkable. SOFT TISSUES: A right thyroid  nodule measures 2.5 cm. Esophagus is distended. No mass lesion is present. Bilateral subcutaneous battery packs and control units are placed over the chest. BONES: Multilevel degenerative changes are present in the cervical spine. Ankylosis is present across the disc space at C2-3 and C3-4. Slight degenerative anterolisthesis present at C4-C5. Uncovertebral spurring contributes to foraminal narrowing bilaterally at C5-6 and C6-7. CTA HEAD: ANTERIOR CIRCULATION: Right cavernous ICA stent is patent. Atherosclerotic calcifications are present in the cavernous left ICA without significant stenosis relative to the ICA terminus. High-grade stenosis are present in the proximal A1 segments bilaterally. Migraine stenosis present in the distal right M1 segment. Segmental narrowing is present throughout the ACA and MCA branch vessels. POSTERIOR CIRCULATION: High-grade stenosis are present in the proximal left P1 and P2 segments. A high-grade stenosis is present in the more distal right P2 segment. Marked attenuation of PCA branch vessels is noted, right greater than left. OTHER: No dural venous sinus thrombosis on this non-dedicated study. IMPRESSION: 1. Migraine, near occlusive, stenosis of the the mid right p2 segment corresponds with the right occipital infarct. 2. High-grade stenosis  in the origin of the right vertebral artery. 3. High-grade stenosis in the proximal A1 segments bilaterally, distal right M1 segment and moderate egmental stenoses throughout the ACA and MCA branch vessels. 4. Moderate proximal left P1 and P2 segments. Marked  attenuation of PCA branch vessels, right greater than left. Electronically signed by: Lonni Necessary MD 02/14/2024 05:11 PM EDT RP Workstation: [id number]   CT HEAD CODE STROKE WO CONTRAST Result Date: 02/14/2024 EXAM: CT HEAD WITHOUT 02/14/2024 04:51:03 PM TECHNIQUE: CT of the head was performed without the administration of intravenous contrast. Automated exposure control, iterative reconstruction, and/or weight based adjustment of the mA/kV was utilized to reduce the radiation dose to as low as reasonably achievable. COMPARISON: CT head without contrast 02/14/2024 at 10:35 AM. CLINICAL HISTORY: Neuro deficit, acute, stroke suspected. FINDINGS: BRAIN AND VENTRICLES: Evolving inferior right occipital lobe infarct is noted. Right perianal craniotomy and temporal lobe resection is again noted. A remote infarct in the high medial right frontal lobe is stable. Atrophy and white matter disease, mildly advanced for age is otherwise stable. ORBITS: Lens replacement. SINUSES AND MASTOIDS: Moderate mucosal thickening is present in the maxillary sinuses bilaterally. Bilateral maxillary antrostomies are noted. Minimal fluid is present in the inferior left mastoid air cells. No obstructing nasopharyngeal lesion is present. SOFT TISSUES AND SKULL: No acute skull fracture. No acute soft tissue abnormality. VASCULATURE: Right cavernous internal carotid artery stent is in place. Atherosclerotic changes are present in the cavernous left ICA. IMPRESSION: 1. Evolving inferior right occipital lobe infarct. 2. Stable remote infarct in the high medial right frontal lobe. 3. Right perianal craniotomy and temporal lobe resection. 4. Right cavernous internal carotid artery stent in place. Atherosclerotic changes in the cavernous left ICA. 5. Mildly advanced atrophy and white matter disease, stable. Electronically signed by: Lonni Necessary MD 02/14/2024 05:01 PM EDT RP Workstation: [id number]   CT HEAD WO CONTRAST Result  Date: 02/14/2024 CLINICAL DATA:  88 year old male with left eye vision changes and abnormal gait since yesterday morning. EXAM: CT HEAD WITHOUT CONTRAST TECHNIQUE: Contiguous axial images were obtained from the base of the skull through the vertex without intravenous contrast. RADIATION DOSE REDUCTION: This exam was performed according to the departmental dose-optimization program which includes automated exposure control, adjustment of the mA and/or kV according to patient size and/or use of iterative reconstruction technique. COMPARISON:  Head CT 08/05/2023. FINDINGS: Brain: Chronic right middle cranial fossa resection cavity, subtotal absence of the right temporal lobe. Stable cerebral volume from earlier this year. New confluent hypodensity in the right occipital pole since February (coronal image 15 and series 5, image 26), along the inferior calcarine sulcus. No associated hemorrhage or mass effect. See sagittal image 26. No other acute cortically based infarct identified. Stable encephalomalacia in the distal right ACA territory, right cingulate gyrus (coronal image 43). No superimposed No midline shift, ventriculomegaly, mass effect, acute intracranial hemorrhage. Vascular: Calcified atherosclerosis at the skull base. No suspicious intracranial vascular hyperdensity. Skull: Chronic right frontotemporal craniotomy. No acute osseous abnormality identified. Sinuses/Orbits: Chronic paranasal sinus disease with up to moderate mucoperiosteal thickening bilaterally has not significantly changed. Tympanic cavities and mastoids remain well aerated. Other: No acute orbit or scalp soft tissue finding. Chronic postoperative changes to both globes. IMPRESSION: 1. Positive for small acute or subacute Right PCA territory infarct affecting the inferior right occipital pole. Correlate for Left visual field deficits. No hemorrhagic transformation or mass effect. 2. Otherwise stable including chronic distal right ACA infarct,  chronic right middle cranial fossa resection. Chronic  paranasal sinus disease. Electronically Signed   By: VEAR Hurst M.D.   On: 02/14/2024 10:48   CUP PACEART REMOTE DEVICE CHECK Result Date: 01/29/2024 ICD Monthly battery check.  Estimated 4 months. Normal device function. No new alerts. 6 AT/AF episodes, all on 01/13/24 between 14:48 and 16:56, longest 54 min 15 sec, burden 0.3% since 12/29/23, not on OAC due to contraindication with low occurrence per Epic. Follow up as scheduled monthly. [facility], CVRS ICD Monthly battery check.  Estimated 4 months. Normal device function. No new alerts. 6 AT/AF episodes, all on 01/13/24 between 14:48 and 16:56, longest 54 min 15 sec, V rates > 100 bpm ~ 35%, burden 0.3% since 12/29/23, not on OAC due to contraindication with low occurrence per Epic. Follow up as scheduled monthly. MC, CVRS   Assessment/Plan There are no diagnoses linked to this encounter.   Family/ staff Communication:   Labs/tests ordered:

## 2024-02-18 ENCOUNTER — Encounter: Payer: Self-pay | Admitting: Orthopedic Surgery

## 2024-02-20 NOTE — Telephone Encounter (Signed)
 Spoke with the patient and his nurse Delon at [facility]- informed of Dr Francyne wants him to come in for an appointment for his pacemaker 03/18/24 at 0900. The nurse took down the information and will give to his wife as well. Informed that I would call his wife too.   Called and spoke with Slater (dpr) informed of appointment scheduled for 03/18/24 and asked if she can come as well because Dr will need to discuss device near end of battery life and options. She said that she will come to this appointment. Informed her of our new location. She verbalized understanding.

## 2024-02-23 ENCOUNTER — Encounter: Payer: Self-pay | Admitting: Adult Health

## 2024-02-23 ENCOUNTER — Non-Acute Institutional Stay: Admitting: Adult Health

## 2024-02-23 VITALS — BP 128/76 | HR 63 | Temp 97.7°F | Ht 68.0 in | Wt 171.6 lb

## 2024-02-23 DIAGNOSIS — E782 Mixed hyperlipidemia: Secondary | ICD-10-CM | POA: Diagnosis not present

## 2024-02-23 DIAGNOSIS — G40211 Localization-related (focal) (partial) symptomatic epilepsy and epileptic syndromes with complex partial seizures, intractable, with status epilepticus: Secondary | ICD-10-CM | POA: Diagnosis not present

## 2024-02-23 DIAGNOSIS — I482 Chronic atrial fibrillation, unspecified: Secondary | ICD-10-CM | POA: Diagnosis not present

## 2024-02-23 DIAGNOSIS — E871 Hypo-osmolality and hyponatremia: Secondary | ICD-10-CM | POA: Diagnosis not present

## 2024-02-23 DIAGNOSIS — I639 Cerebral infarction, unspecified: Secondary | ICD-10-CM | POA: Diagnosis not present

## 2024-02-23 DIAGNOSIS — G40909 Epilepsy, unspecified, not intractable, without status epilepticus: Secondary | ICD-10-CM

## 2024-02-23 LAB — BASIC METABOLIC PANEL WITH GFR
BUN: 17 (ref 4–21)
CO2: 20 (ref 13–22)
Chloride: 103 (ref 99–108)
Creatinine: 0.7 (ref 0.6–1.3)
Glucose: 84
Potassium: 4.2 meq/L (ref 3.5–5.1)
Sodium: 135 — AB (ref 137–147)

## 2024-02-23 LAB — CBC AND DIFFERENTIAL
HCT: 35 — AB (ref 41–53)
Hemoglobin: 12.2 — AB (ref 13.5–17.5)
Platelets: 204 K/uL (ref 150–400)
WBC: 5

## 2024-02-23 LAB — COMPREHENSIVE METABOLIC PANEL WITH GFR
Calcium: 9 (ref 8.7–10.7)
eGFR: >90

## 2024-02-23 LAB — CBC: RBC: 3.73 — AB (ref 3.87–5.11)

## 2024-02-23 NOTE — Progress Notes (Signed)
 Location:  [facility]  POS: Clinic  Provider: Tawni America, ANP  Code Status: DNR Goals of Care:     02/17/2024    4:32 PM  Advanced Directives  Does Patient Have a Medical Advance Directive? Yes  Type of Advance Directive Living will;Out of facility DNR (pink MOST or yellow form);Healthcare Power of Attorney  Does patient want to make changes to medical advance directive? No - Patient declined  Copy of Healthcare Power of Attorney in Chart? Yes - validated most recent copy scanned in chart (See row information)  Pre-existing out of facility DNR order (yellow form or pink MOST form) Pink Most/Yellow Form available - Physician notified to receive inpatient order     Chief Complaint  Patient presents with   Hospitalization Follow-up    Mild stroke    HPI: Discussed the use of AI scribe software for clinical note transcription with the patient, who gave verbal consent to proceed. PMH: afib off coumadin  due to falls, seizures, right temporal lobe resection, vagus nerve stimulator, SIADH, HTN, Grover's disease, Cardiac arrest with defibrillator, HLD, and BPH   History of Present Illness Cameron Potts is an 88 year old male with a history of stroke and seizures who presents for follow-up after recent hospitalization for a stroke.  Cerebrovascular accident and visual field deficit - Hospitalized from August 30th, 2025 to September 1st, 2025 for stroke with CT showing small acute and subacute right PCA territory infarct - Continues on aspirin  and Plavix  without new changes in left visual field - Baseline left-sided peripheral vision defect due to optic nerve damage from seizures, unchanged since May - Visual impairment prevents driving; has not driven for thirty years  Seizure disorder - Managed with Dilantin , Keppra , and Vimpat  - During hospitalization, low Dilantin  level corrected with loading dose and increased maintenance dose - Sensitive to Dilantin   levels  Hyponatremia - Last sodium measured at 135 on August 31st, 2025 - Takes sodium tablets twice daily - Monitors fluid intake  Dysphagia and pulmonary findings - Choked on fish on 02/17/24, leading to chest x-ray showing perihilar fullness/pna - Treated with doxycycline  for seven days - Occasional yellow mucus expectoration, consistent with baseline  Neurological symptoms - No changes in left-side strength - No dizziness or lightheadedness     Past Medical History:  Diagnosis Date   Abnormality of gait 12/21/2013   Atrial fibrillation (HCC)    Blind left eye    Carcinoma in situ of prostate    Cardiac arrest (HCC)    Glaucoma    Hypercholesteremia    Hyperlipidemia    Metabolic encephalopathy    Mild left ventricular systolic dysfunction    Orthostatic hypotension    Presence of other specified functional implants    PSA elevation    Right frontal lobe lesion    Seizures (HCC)    Tinea pedis    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grover)    Urinary urgency     Past Surgical History:  Procedure Laterality Date   CARDIAC DEFIBRILLATOR PLACEMENT     cataract surgery     COLONOSCOPY     CRANIOTOMY Right    right anterior temporal resection   defibrillator replaced  08/16/2014   IMPLANTATION VAGAL NERVE STIMULATOR  11/16/2014   NASAL SINUS SURGERY     TONSILLECTOMY     age 50   VASECTOMY  06/18/1983    Allergies  Allergen Reactions   Depakote [Divalproex Sodium] Other (See Comments)    Arthralgias  Outpatient Encounter Medications as of 02/23/2024  Medication Sig   acetaminophen  (TYLENOL ) 325 MG tablet Take 650 mg by mouth every 6 (six) hours as needed for mild pain (pain score 1-3) or moderate pain (pain score 4-6).   aspirin  EC 81 MG tablet Take 1 tablet (81 mg total) by mouth daily. Swallow whole.   atorvastatin  (LIPITOR) 20 MG tablet Take 1 tablet (20 mg total) by mouth daily.   brimonidine -timolol  (COMBIGAN ) 0.2-0.5 % ophthalmic solution Place 1  drop into both eyes 2 (two) times daily.   calcium  carbonate (TUMS SMOOTHIES) 750 MG chewable tablet Chew 2 tablets by mouth 4 (four) times daily as needed for heartburn.   camphor-menthol (SARNA) lotion Apply 1 Application topically every 8 (eight) hours as needed for itching.   cetirizine  (ZYRTEC ) 10 MG tablet Take 10 mg by mouth as needed for allergies.   cholecalciferol (VITAMIN D ) 400 units TABS tablet Take 400 Units by mouth 2 (two) times daily.   clobetasol  cream (TEMOVATE ) 0.05 % Apply 1 application  topically 2 (two) times daily as needed (rash).   clopidogrel  (PLAVIX ) 75 MG tablet Take 1 tablet (75 mg total) by mouth daily for 21 days.   doxycycline  (ADOXA) 100 MG tablet Take 100 mg by mouth 2 (two) times daily.   econazole nitrate 1 % cream Apply 1 Application topically 2 (two) times daily as needed.   finasteride  (PROSCAR ) 5 MG tablet Take 5 mg by mouth every morning.   flecainide  (TAMBOCOR ) 50 MG tablet Take 50 mg by mouth 2 (two) times daily.   lacosamide  (VIMPAT ) 200 MG TABS tablet Take 1 tablet (200 mg total) by mouth 2 (two) times daily.   latanoprost  (XALATAN ) 0.005 % ophthalmic solution Place 1 drop into both eyes at bedtime.   Levetiracetam  750 MG TB24 Take 2,250 mg by mouth every evening.   LORazepam  (ATIVAN ) 2 MG/ML concentrated solution Take 2 mg by mouth every 12 (twelve) hours as needed for anxiety.   LORazepam  (ATIVAN ) 2 MG/ML injection Inject 0.5 mg into the muscle daily as needed for anxiety.   phenytoin  (DILANTIN ) 100 MG ER capsule Take 200 mg by mouth daily. 2 Capsules in the evening   phenytoin  (DILANTIN ) 100 MG ER capsule Take 100 mg by mouth in the morning.   polyethylene glycol (MIRALAX  / GLYCOLAX ) 17 g packet Take 17 g by mouth every other day.   polyethylene glycol (MIRALAX  / GLYCOLAX ) 17 g packet Take 17 g by mouth daily as needed.   sodium chloride  1 g tablet Take 2 tablets (2 g total) by mouth 3 (three) times daily.   talc powder Apply 1 Application  topically 2 (two) times daily.   tamsulosin  (FLOMAX ) 0.4 MG CAPS capsule Take 0.4 mg by mouth in the morning.   triamcinolone cream (KENALOG) 0.1 % Apply 1 Application topically 2 (two) times daily as needed (Rash).   No facility-administered encounter medications on file as of 02/23/2024.    Review of Systems:  Review of Systems  Constitutional:  Negative for activity change, appetite change, chills, diaphoresis, fatigue and fever.  HENT:  Negative for congestion.   Eyes:  Positive for visual disturbance (left visual field defect).  Respiratory:  Negative for cough, shortness of breath and wheezing.   Cardiovascular:  Negative for chest pain and leg swelling.  Gastrointestinal:  Negative for abdominal distention, abdominal pain, constipation, diarrhea, nausea and vomiting.  Genitourinary:  Negative for difficulty urinating, dysuria and urgency.  Musculoskeletal:  Positive for gait problem. Negative for  back pain, myalgias and neck pain.  Skin:  Negative for rash.  Neurological:  Negative for dizziness, facial asymmetry, weakness and headaches.  Psychiatric/Behavioral:  Negative for confusion.     Health Maintenance  Topic Date Due   Medicare Annual Wellness ([name])  06/14/2020   Influenza Vaccine  01/16/2024   COVID-19 Vaccine (6 - 2025-26 season) 02/16/2024   Zoster Vaccines- Shingrix (2 of 2) 12/28/2024 (Originally 01/29/2017)   DTaP/Tdap/Td (4 - Td or Tdap) 09/18/2030   Pneumococcal Vaccine: 50+ Years  Completed   HPV VACCINES  Aged Out   Meningococcal B Vaccine  Aged Out    Physical Exam: Vitals:   02/23/24 0834  Height: 5' 8 (1.727 m)   Body mass index is 32.39 kg/m. Physical Exam Vitals and nursing note reviewed.  Constitutional:      Appearance: Normal appearance.  HENT:     Head: Normocephalic and atraumatic.     Right Ear: Tympanic membrane normal.     Left Ear: Tympanic membrane normal.     Nose: Nose normal.     Mouth/Throat:     Mouth: Mucous membranes are  moist.     Pharynx: Oropharynx is clear.  Eyes:     Conjunctiva/sclera: Conjunctivae normal.     Pupils: Pupils are equal, round, and reactive to light.  Cardiovascular:     Rate and Rhythm: Normal rate and regular rhythm.     Heart sounds: No murmur heard. Pulmonary:     Effort: Pulmonary effort is normal. No respiratory distress.     Breath sounds: Normal breath sounds. No wheezing.  Abdominal:     General: Bowel sounds are normal. There is no distension.     Palpations: Abdomen is soft.     Tenderness: There is no abdominal tenderness.  Musculoskeletal:     Cervical back: No rigidity.     Right lower leg: No edema.     Left lower leg: No edema.     Comments: Strength 5/5 BUE and BLE  Lymphadenopathy:     Cervical: No cervical adenopathy.  Skin:    General: Skin is warm and dry.  Neurological:     Mental Status: He is alert and oriented to person, place, and time. Mental status is at baseline.     Comments: Left visual field defect No facial droop or speech issues.   Psychiatric:        Mood and Affect: Mood normal.     Labs reviewed: Basic Metabolic Panel: Recent Labs    10/17/23 0000 02/14/24 1006 02/14/24 1012 02/14/24 1026 02/15/24 0513  NA 138 135 136  --  135  K 4.6 4.2 4.3  --  4.2  CL 103 105 103  --  103  CO2 25* 20*  --   --  22  GLUCOSE  --  95 97  --  90  BUN 12 13 14   --  10  CREATININE 0.7 0.74 0.70  --  0.65  CALCIUM  8.9 8.9  --   --  8.6*  MG  --   --   --  2.0 1.8  PHOS  --   --   --   --  3.9  TSH 4.44  --   --   --   --    Liver Function Tests: Recent Labs    03/04/23 0000 02/14/24 1006 02/15/24 0513  AST 25 23 21   ALT 18 18 16   ALKPHOS 148* 126 116  BILITOT  --  0.6  0.7  PROT  --  7.5 6.4*  ALBUMIN 4.2 4.0 3.4*   No results for input(s): LIPASE, AMYLASE in the last 8760 hours. No results for input(s): AMMONIA in the last 8760 hours. CBC: Recent Labs    08/05/23 1457 02/14/24 1006 02/14/24 1012 02/15/24 0513  WBC  6.4 6.1  --  5.4  NEUTROABS  --  3.2  --   --   HGB 11.6* 12.9* 12.6* 12.0*  HCT 33.9* 36.7* 37.0* 33.5*  MCV 94.2 93.4  --  91.5  PLT 209 209  --  186   Lipid Panel: Recent Labs    10/17/23 0000 02/15/24 0513  CHOL 214* 184  HDL 66 59  LDLCALC 137 113*  TRIG 54 59  CHOLHDL  --  3.1   Lab Results  Component Value Date   HGBA1C 5.1 02/14/2024    Procedures since last visit: ECHOCARDIOGRAM COMPLETE Result Date: 02/16/2024    ECHOCARDIOGRAM REPORT   Patient Name:   Jama Elex Candy Potts Date of Exam: 02/16/2024 Medical Rec #:  [id number]              Height:       68.0 in Accession #:    [id number]             Weight:       170.0 lb Date of Birth:  07/06/1935              BSA:          1.907 m Patient Age:    88 years               BP:           153/69 mmHg Patient Gender: M                      HR:           64 bpm. Exam Location:  Inpatient Procedure: 2D Echo, Cardiac Doppler and Color Doppler (Both Spectral and Color            Flow Doppler were utilized during procedure). Indications:    Stroke I63.9  History:        Patient has prior history of Echocardiogram examinations, most                 recent 03/21/2022. Arrythmias:Atrial Fibrillation.  Sonographer:    Jayson Gaskins Referring Phys: [phone] PARDEEP KHATRI IMPRESSIONS  1. Left ventricular ejection fraction, by estimation, is 60 to 65%. The left ventricle has normal function. The left ventricle has no regional wall motion abnormalities. Left ventricular diastolic parameters were normal.  2. Right ventricular systolic function is normal. The right ventricular size is normal.  3. Left atrial size was mild to moderately dilated.  4. The mitral valve is normal in structure. No evidence of mitral valve regurgitation. No evidence of mitral stenosis.  5. The aortic valve is normal in structure. Aortic valve regurgitation is not visualized. No aortic stenosis is present.  6. The inferior vena cava is normal in size with greater than 50% respiratory  variability, suggesting right atrial pressure of 3 mmHg. Comparison(s): No significant change from prior study. Prior images reviewed side by side. FINDINGS  Left Ventricle: Left ventricular ejection fraction, by estimation, is 60 to 65%. The left ventricle has normal function. The left ventricle has no regional wall motion abnormalities. The left ventricular internal cavity size was normal in size. There is  no left  ventricular hypertrophy. Left ventricular diastolic parameters were normal. Right Ventricle: The right ventricular size is normal. No increase in right ventricular wall thickness. Right ventricular systolic function is normal. Left Atrium: Left atrial size was mild to moderately dilated. Right Atrium: Right atrial size was normal in size. Pericardium: There is no evidence of pericardial effusion. Mitral Valve: The mitral valve is normal in structure. No evidence of mitral valve regurgitation. No evidence of mitral valve stenosis. Tricuspid Valve: The tricuspid valve is normal in structure. Tricuspid valve regurgitation is not demonstrated. No evidence of tricuspid stenosis. Aortic Valve: The aortic valve is normal in structure. Aortic valve regurgitation is not visualized. No aortic stenosis is present. Aortic valve mean gradient measures 4.0 mmHg. Aortic valve peak gradient measures 6.7 mmHg. Aortic valve area, by VTI measures 2.84 cm. Pulmonic Valve: The pulmonic valve was not well visualized. Pulmonic valve regurgitation is not visualized. No evidence of pulmonic stenosis. Aorta: The aortic root is normal in size and structure. Venous: The inferior vena cava is normal in size with greater than 50% respiratory variability, suggesting right atrial pressure of 3 mmHg. IAS/Shunts: No atrial level shunt detected by color flow Doppler. Additional Comments: A device lead is visualized in the right ventricle.  LEFT VENTRICLE PLAX 2D LVIDd:         3.80 cm   Diastology LVIDs:         3.00 cm   LV e' medial:     8.27 cm/s LV PW:         1.00 cm   LV E/e' medial:  12.3 LV IVS:        0.90 cm   LV e' lateral:   11.50 cm/s LVOT diam:     1.90 cm   LV E/e' lateral: 8.9 LV SV:         78 LV SV Index:   41 LVOT Area:     2.84 cm  RIGHT VENTRICLE RV S prime:     11.10 cm/s TAPSE (M-mode): 2.5 cm LEFT ATRIUM             Index        RIGHT ATRIUM           Index LA Vol (A2C):   68.6 ml 35.97 ml/m  RA Area:     18.80 cm LA Vol (A4C):   45.2 ml 23.70 ml/m  RA Volume:   49.00 ml  25.69 ml/m LA Biplane Vol: 60.0 ml 31.46 ml/m  AORTIC VALVE AV Area (Vmax):    2.84 cm AV Area (Vmean):   3.46 cm AV Area (VTI):     2.84 cm AV Vmax:           129.00 cm/s AV Vmean:          92.700 cm/s AV VTI:            0.274 m AV Peak Grad:      6.7 mmHg AV Mean Grad:      4.0 mmHg LVOT Vmax:         129.00 cm/s LVOT Vmean:        113.000 cm/s LVOT VTI:          0.274 m LVOT/AV VTI ratio: 1.00  AORTA Ao Root diam: 3.20 cm MITRAL VALVE MV Area (PHT): 3.39 cm     SHUNTS MV Decel Time: 224 msec     Systemic VTI:  0.27 m MV E velocity: 102.00 cm/s  Systemic Diam: 1.90 cm Mihai Croitoru  MD Electronically signed by Jerel Balding MD Signature Date/Time: 02/16/2024/2:13:46 PM    Final    Overnight EEG with video Result Date: 02/15/2024 Shelton Arlin KIDD, MD     02/16/2024  9:28 AM Patient Name: Cameron Potts MRN: [id number] Epilepsy Attending: Arlin KIDD Shelton Referring Physician/Provider: Denna Mimi ORN, NP Duration: 02/14/2024 1754 to 02/15/2024 1754 Patient history:  88 y.o. 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 EEG to evaluate for seizure Level of  alertness: Awake, asleep AEDs during EEG study: LEV, LCM, PHT Technical aspects: This EEG study was done with scalp electrodes positioned according to the 10-20 International system of electrode placement. Electrical activity was reviewed with band pass filter of 1-70Hz , sensitivity of 7 uV/mm, display speed of 82mm/sec with a 60Hz  notched filter applied as appropriate. EEG data were recorded continuously and digitally stored.  Video monitoring was available and reviewed as appropriate. Description: The posterior dominant rhythm consists of 7.5 Hz activity of moderate voltage (25-35 uV) seen predominantly in posterior head regions, symmetric and reactive to eye opening and eye closing. Sleep was characterized by vertex waves, sleep spindles (12 to 14 Hz), maximal frontocentral region.  EEG showed continuous polymorphic 3 to 6 Hz theta-delta slowing in right temporal region consistent with breach artifact. Spikes were noted in right anterior temporal region, predominantly during sleep. Intermittent generalized 3-5hz  theta-delta slowing was also noted. Hyperventilation and photic stimulation were not performed.   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 Additionally there is cortical dysfunction arising from right temporal region consistent with underlying craniotomy. Lastly there was mild diffuse encephalopathy. No seizures were seen throughout the recording. Priyanka KIDD Shelton   CT ANGIO HEAD NECK W WO CM (CODE STROKE) Result Date: 02/14/2024 EXAM: CT HEAD WITHOUT CTA HEAD AND NECK WITH AND WITHOUT 02/14/2024 04:59:55 PM TECHNIQUE: CTA of the head and neck was performed with and without the administration of intravenous contrast. Noncontrast CT of the head with reconstructed 2-D images are also provided for  review. Multiplanar 2D and/or 3D reformatted images are provided for review. Automated exposure control, iterative reconstruction, and/or weight based adjustment of the mA/kV was utilized to reduce the radiation dose to as low as reasonably achievable. COMPARISON: CT head without contrast 02/14/24. CLINICAL HISTORY: Neuro deficit, acute, stroke suspected. FINDINGS: CT HEAD: BRAIN AND VENTRICLES: No acute intracranial hemorrhage. No mass effect. No midline shift. No extra-axial fluid collection. No evidence of acute infarct. No hydrocephalus. ORBITS: No acute abnormality. SINUSES AND MASTOIDS: No acute abnormality. CTA NECK: AORTIC ARCH AND ARCH VESSELS: Atherosclerotic calcifications are present at the origin of the left subclavian artery without a significant stenosis. Calcifications are present in the distal arch. No aneurysm or dissection is present. CERVICAL CAROTID ARTERIES: Mural calcifications are present along the right common carotid artery. More complex calcifications are present at the right carotid bifurcation. No significant stenosis of greater than 50% is present relative to the more distal vessels. Atherosclerotic calcifications are present at the left carotid bifurcation without significant  stenosis. CERVICAL VERTEBRAL ARTERIES: A high-grade stenosis present in the origin of the right vertebral artery. No additional stenosis are present in either vertebral artery in the neck. LUNGS AND MEDIASTINUM: Unremarkable. SOFT TISSUES: A right thyroid  nodule measures 2.5 cm. Esophagus is distended. No mass lesion is present. Bilateral subcutaneous battery packs and control units are placed over the chest. BONES: Multilevel degenerative changes are present in the cervical spine. Ankylosis is present across the disc space at C2-3 and C3-4. Slight degenerative anterolisthesis present at C4-C5. Uncovertebral spurring contributes to foraminal narrowing bilaterally at C5-6 and C6-7. CTA HEAD: ANTERIOR CIRCULATION:  Right cavernous ICA stent is patent. Atherosclerotic calcifications are present in the cavernous left ICA without significant stenosis relative to the ICA terminus. High-grade stenosis are present in the proximal A1 segments bilaterally. Migraine stenosis present in the distal right M1 segment. Segmental narrowing is present throughout the ACA and MCA branch vessels. POSTERIOR CIRCULATION: High-grade stenosis are present in the proximal left P1 and P2 segments. A high-grade stenosis is present in the more distal right P2 segment. Marked attenuation of PCA branch vessels is noted, right greater than left. OTHER: No dural venous sinus thrombosis on this non-dedicated study. IMPRESSION: 1. Migraine, near occlusive, stenosis of the the mid right p2 segment corresponds with the right occipital infarct. 2. High-grade stenosis in the origin of the right vertebral artery. 3. High-grade stenosis in the proximal A1 segments bilaterally, distal right M1 segment and moderate egmental stenoses throughout the ACA and MCA branch vessels. 4. Moderate proximal left P1 and P2 segments. Marked attenuation of PCA branch vessels, right greater than left. Electronically signed by: Lonni Necessary MD 02/14/2024 05:11 PM EDT RP Workstation: [id number]   CT HEAD CODE STROKE WO CONTRAST Result Date: 02/14/2024 EXAM: CT HEAD WITHOUT 02/14/2024 04:51:03 PM TECHNIQUE: CT of the head was performed without the administration of intravenous contrast. Automated exposure control, iterative reconstruction, and/or weight based adjustment of the mA/kV was utilized to reduce the radiation dose to as low as reasonably achievable. COMPARISON: CT head without contrast 02/14/2024 at 10:35 AM. CLINICAL HISTORY: Neuro deficit, acute, stroke suspected. FINDINGS: BRAIN AND VENTRICLES: Evolving inferior right occipital lobe infarct is noted. Right perianal craniotomy and temporal lobe resection is again noted. A remote infarct in the high medial right  frontal lobe is stable. Atrophy and white matter disease, mildly advanced for age is otherwise stable. ORBITS: Lens replacement. SINUSES AND MASTOIDS: Moderate mucosal thickening is present in the maxillary sinuses bilaterally. Bilateral maxillary antrostomies are noted. Minimal fluid is present in the inferior left mastoid air cells. No obstructing nasopharyngeal lesion is present. SOFT TISSUES AND SKULL: No acute skull fracture. No acute soft tissue abnormality. VASCULATURE: Right cavernous internal carotid artery stent is in place. Atherosclerotic changes are present in the cavernous left ICA. IMPRESSION: 1. Evolving inferior right occipital lobe infarct. 2. Stable remote infarct in the high medial right frontal lobe. 3. Right perianal craniotomy and temporal lobe resection. 4. Right cavernous internal carotid artery stent in place. Atherosclerotic changes in the cavernous left ICA. 5. Mildly advanced atrophy and white matter disease, stable. Electronically signed by: Lonni Necessary MD 02/14/2024 05:01 PM EDT RP Workstation: [id number]   CT HEAD WO CONTRAST Result Date: 02/14/2024 CLINICAL DATA:  88 year old male with left eye vision changes and abnormal gait since yesterday morning. EXAM: CT HEAD WITHOUT CONTRAST TECHNIQUE: Contiguous axial images were obtained from the base of the skull through the vertex without intravenous contrast. RADIATION DOSE REDUCTION: This exam was performed according  to the departmental dose-optimization program which includes automated exposure control, adjustment of the mA and/or kV according to patient size and/or use of iterative reconstruction technique. COMPARISON:  Head CT 08/05/2023. FINDINGS: Brain: Chronic right middle cranial fossa resection cavity, subtotal absence of the right temporal lobe. Stable cerebral volume from earlier this year. New confluent hypodensity in the right occipital pole since February (coronal image 15 and series 5, image 26), along the  inferior calcarine sulcus. No associated hemorrhage or mass effect. See sagittal image 26. No other acute cortically based infarct identified. Stable encephalomalacia in the distal right ACA territory, right cingulate gyrus (coronal image 43). No superimposed No midline shift, ventriculomegaly, mass effect, acute intracranial hemorrhage. Vascular: Calcified atherosclerosis at the skull base. No suspicious intracranial vascular hyperdensity. Skull: Chronic right frontotemporal craniotomy. No acute osseous abnormality identified. Sinuses/Orbits: Chronic paranasal sinus disease with up to moderate mucoperiosteal thickening bilaterally has not significantly changed. Tympanic cavities and mastoids remain well aerated. Other: No acute orbit or scalp soft tissue finding. Chronic postoperative changes to both globes. IMPRESSION: 1. Positive for small acute or subacute Right PCA territory infarct affecting the inferior right occipital pole. Correlate for Left visual field deficits. No hemorrhagic transformation or mass effect. 2. Otherwise stable including chronic distal right ACA infarct, chronic right middle cranial fossa resection. Chronic paranasal sinus disease. Electronically Signed   By: VEAR Hurst M.D.   On: 02/14/2024 10:48   CUP PACEART REMOTE DEVICE CHECK Result Date: 01/29/2024 ICD Monthly battery check.  Estimated 4 months. Normal device function. No new alerts. 6 AT/AF episodes, all on 01/13/24 between 14:48 and 16:56, longest 54 min 15 sec, burden 0.3% since 12/29/23, not on OAC due to contraindication with low occurrence per Epic. Follow up as scheduled monthly. [facility], CVRS ICD Monthly battery check.  Estimated 4 months. Normal device function. No new alerts. 6 AT/AF episodes, all on 01/13/24 between 14:48 and 16:56, longest 54 min 15 sec, V rates > 100 bpm ~ 35%, burden 0.3% since 12/29/23, not on OAC due to contraindication with low occurrence per Epic. Follow up as scheduled monthly. [facility],  CVRS   Assessment/Plan  Assessment & Plan Acute right PCA territory ischemic stroke Recent right PCA territory infarct with left visual deficits. Neurologist recommended aspirin  and Plavix  for three weeks, then aspirin  alone. Echocardiogram normal. Prefers to avoid Coumadin  due to falls, previously agreed upon with cardiology. - Continue aspirin  and Plavix  for three weeks, then switch to aspirin  alone.  Seizure disorder Seizures controlled with Dilantin , Keppra , and Vimpat . Recent hospitalization for low Dilantin  levels, adjusted to 200 mg twice daily. Sensitive to Dilantin  levels. Follow-up with epileptologist in October. - Continue Dilantin  200 mg twice a day. - Continue Keppra  and Vimpat  as prescribed. - Follow up with epileptologist in October.  Hyponatremia Low sodium levels, last recorded at 135. On sodium tablets twice daily. - Continue sodium tablets twice a day.  Atrial fibrillation Atrial fibrillation managed with flecainide . Echocardiogram normal. - Continue flecainide  as prescribed.  Hyperlipidemia LDL 113, total cholesterol 184. On Lipitor to manage cholesterol and prevent stroke. - Continue Lipitor as prescribed.  Recent aspiration pneumonia Diagnosed with possible pneumonia after choking, treated with doxycycline . Occasional yellow mucus, no current cough or congestion. improved  Hx of orthostatic hypotension Not currently an issue     Labs/tests ordered:  * No order type specified *CBC BMP Dilantin  level  pending Next appt:  05/03/2024   Total time :  time greater than 50% of total time spent doing  pt counseling and coordination of care

## 2024-02-26 ENCOUNTER — Non-Acute Institutional Stay: Payer: Self-pay | Admitting: Adult Health

## 2024-02-26 ENCOUNTER — Encounter: Payer: Self-pay | Admitting: Adult Health

## 2024-02-26 DIAGNOSIS — Z66 Do not resuscitate: Secondary | ICD-10-CM

## 2024-02-26 DIAGNOSIS — E782 Mixed hyperlipidemia: Secondary | ICD-10-CM | POA: Diagnosis not present

## 2024-02-26 NOTE — Progress Notes (Signed)
 Location:  [facility] Room Number: 536-P Place of Service:  ALF [id number]) Provider:  Darlean Maus, NP  Patient Care Team: Charlanne Fredia CROME, MD as PCP - General (Internal Medicine) Cameron Headland, MD as PCP - Cardiology (Cardiology) Cameron Anes, MD as Consulting Physician (Ophthalmology) Cameron Lek, MD as Consulting Physician (Neurology)  Extended Emergency Contact Information Primary Emergency Contact: CameronJane Address: [address]          Cameron Potts [place], [place] [address] [place] Home Phone: [phone] Mobile Phone: [phone] Relation: Spouse Secondary Emergency Contact: Fayette MADISON Ruth Home Phone: [phone] Mobile Phone: [phone] Relation: Son  Code Status:  DNR Goals of care: Advanced Directive information    02/17/2024    4:32 PM  Advanced Directives  Does Patient Have a Medical Advance Directive? Yes  Type of Advance Directive Living will;Out of facility DNR (pink MOST or yellow form);Healthcare Power of Attorney  Does patient want to make changes to medical advance directive? No - Patient declined  Copy of Healthcare Power of Attorney in Chart? Yes - validated most recent copy scanned in chart (See row information)  Pre-existing out of facility DNR order (yellow form or pink MOST form) Pink Most/Yellow Form available - Physician notified to receive inpatient order     Chief Complaint  Patient presents with   Acute Visit    Discuss statin therapy.     HPI:  Pt is a 88 y.o. male seen today for an acute visit for discussion regarding statin therapy.   Discharged from the hospital 02/16/24 due to left peripheral vision field defect worsening concern for CVA  Cameron Potts has been refusing lipitor prescribed due to issues of weakness and fatigue after starting a statin. He also is a fall risk. His wife Cameron Potts is here for the visit and reports that the neurologist at the hospital was not sure that his  symptoms of left visual field defect were due to a CVA vs focal seizure. He did have a CT of the head showing 02/14/24  1. Migraine, near occlusive, stenosis of the the mid right p2 segment corresponds with the right occipital infarct. 2. High-grade stenosis in the origin of the right vertebral artery. 3. High-grade stenosis in the proximal A1 segments bilaterally, distal right M1 segment and moderate egmental stenoses throughout the ACA and MCA branch vessels. 4. Moderate proximal left P1 and P2 segments. Marked attenuation of PCA branch vessels, right greater than left.  He could not have an MRI due to contraindication. He was prescribed lipitor for LDL 113 02/15/24.   He has a chronic left visual field defect which is chronic and unchanged. No new issues.  Past Medical History:  Diagnosis Date   Abnormality of gait 12/21/2013   Atrial fibrillation (HCC)    Blind left eye    Carcinoma in situ of prostate    Cardiac arrest (HCC)    Glaucoma    Hypercholesteremia    Hyperlipidemia    Metabolic encephalopathy    Mild left ventricular systolic dysfunction    Orthostatic hypotension    Presence of other specified functional implants    PSA elevation    Right frontal lobe lesion    Seizures (HCC)    Tinea pedis    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grover)    Urinary urgency    Past Surgical History:  Procedure Laterality Date   CARDIAC DEFIBRILLATOR PLACEMENT     cataract surgery     COLONOSCOPY  CRANIOTOMY Right    right anterior temporal resection   defibrillator replaced  08/16/2014   IMPLANTATION VAGAL NERVE STIMULATOR  11/16/2014   NASAL SINUS SURGERY     TONSILLECTOMY     age 25   VASECTOMY  06/18/1983    Allergies  Allergen Reactions   Depakote [Divalproex Sodium] Other (See Comments)    Arthralgias     Outpatient Encounter Medications as of 02/26/2024  Medication Sig   acetaminophen  (TYLENOL ) 325 MG tablet Take 650 mg by mouth every 6 (six) hours as  needed for mild pain (pain score 1-3) or moderate pain (pain score 4-6).   aspirin  EC 81 MG tablet Take 1 tablet (81 mg total) by mouth daily. Swallow whole.   atorvastatin  (LIPITOR) 20 MG tablet Take 1 tablet (20 mg total) by mouth daily.   brimonidine -timolol  (COMBIGAN ) 0.2-0.5 % ophthalmic solution Place 1 drop into both eyes 2 (two) times daily.   calcium  carbonate (TUMS SMOOTHIES) 750 MG chewable tablet Chew 2 tablets by mouth 4 (four) times daily as needed for heartburn.   camphor-menthol (SARNA) lotion Apply 1 Application topically every 8 (eight) hours as needed for itching.   cetirizine  (ZYRTEC ) 10 MG tablet Take 10 mg by mouth as needed for allergies.   cholecalciferol (VITAMIN D ) 400 units TABS tablet Take 400 Units by mouth 2 (two) times daily.   clobetasol  cream (TEMOVATE ) 0.05 % Apply 1 application  topically 2 (two) times daily as needed (rash).   clopidogrel  (PLAVIX ) 75 MG tablet Take 1 tablet (75 mg total) by mouth daily for 21 days.   econazole nitrate 1 % cream Apply 1 Application topically 2 (two) times daily as needed.   finasteride  (PROSCAR ) 5 MG tablet Take 5 mg by mouth every morning.   flecainide  (TAMBOCOR ) 50 MG tablet Take 50 mg by mouth 2 (two) times daily.   lacosamide  (VIMPAT ) 200 MG TABS tablet Take 1 tablet (200 mg total) by mouth 2 (two) times daily.   latanoprost  (XALATAN ) 0.005 % ophthalmic solution Place 1 drop into both eyes at bedtime.   Levetiracetam  750 MG TB24 Take 2,250 mg by mouth every evening.   LORazepam  (ATIVAN ) 2 MG/ML concentrated solution Take 2 mg by mouth every 12 (twelve) hours as needed for anxiety.   LORazepam  (ATIVAN ) 2 MG/ML injection Inject 0.5 mg into the muscle daily as needed for anxiety.   phenytoin  (DILANTIN ) 100 MG ER capsule Take 200 mg by mouth daily. 2 Capsules in the evening   phenytoin  (DILANTIN ) 100 MG ER capsule Take 100 mg by mouth in the morning.   polyethylene glycol (MIRALAX  / GLYCOLAX ) 17 g packet Take 17 g by mouth every  other day.   polyethylene glycol (MIRALAX  / GLYCOLAX ) 17 g packet Take 17 g by mouth daily as needed.   sodium chloride  1 g tablet Take 2 tablets (2 g total) by mouth 3 (three) times daily.   talc powder Apply 1 Application topically 2 (two) times daily.   tamsulosin  (FLOMAX ) 0.4 MG CAPS capsule Take 0.4 mg by mouth in the morning.   triamcinolone cream (KENALOG) 0.1 % Apply 1 Application topically 2 (two) times daily as needed (Rash).   doxycycline  (ADOXA) 100 MG tablet Take 100 mg by mouth 2 (two) times daily. (Patient not taking: Reported on 02/26/2024)   No facility-administered encounter medications on file as of 02/26/2024.    Review of Systems  Constitutional:  Negative for activity change, appetite change, chills, diaphoresis, fatigue, fever and unexpected weight change.  Eyes:  Positive for visual disturbance.  Respiratory:  Negative for cough, shortness of breath, wheezing and stridor.   Cardiovascular:  Negative for chest pain, palpitations and leg swelling.  Gastrointestinal:  Negative for abdominal distention, abdominal pain, constipation and diarrhea.  Genitourinary:  Negative for difficulty urinating and dysuria.  Musculoskeletal:  Positive for gait problem. Negative for arthralgias, back pain, joint swelling and myalgias.  Neurological:  Negative for dizziness, seizures, syncope, facial asymmetry, speech difficulty, weakness and headaches.  Hematological:  Negative for adenopathy. Does not bruise/bleed easily.  Psychiatric/Behavioral:  Negative for agitation, behavioral problems and confusion.     Immunization History  Administered Date(s) Administered   Fluad Quad(high Dose 65+) 03/22/2022, 04/08/2023   Fluad Trivalent(High Dose 65+) 04/08/2023   INFLUENZA, HIGH DOSE SEASONAL PF 04/07/2020   Influenza, Seasonal, Injecte, Preservative Fre 02/22/2010   Influenza,inj,Quad PF,6+ Mos 04/07/2018, 04/15/2019   Influenza-Unspecified 03/17/2013, 02/24/2014, 02/16/2015,  03/21/2016, 03/17/2017, 04/07/2018, 03/21/2021   Moderna SARS-COV2 Booster Vaccination 01/11/2021, 03/28/2021, 04/08/2023   Moderna Sars-Covid-2 Vaccination 06/28/2019, 07/27/2019, 05/01/2020, 04/22/2022, 10/09/2022   Pneumococcal Conjugate-13 07/05/2013   Pneumococcal Polysaccharide-23 06/17/2006   Td 06/18/2007   Tdap 12/07/2015, 09/17/2020   Zoster Recombinant(Shingrix) 12/04/2016   Zoster, Live 10/06/2008   Pertinent  Health Maintenance Due  Topic Date Due   Influenza Vaccine  05/14/2024 (Originally 01/16/2024)      09/17/2022    3:12 PM 10/21/2022    1:58 PM 03/03/2023    9:30 AM 03/24/2023    2:10 PM 08/25/2023    2:26 PM  Fall Risk  Falls in the past year? 1 1 0 1 1  Was there an injury with Fall? 1 1 0 1 0  Fall Risk Category Calculator 3 3 0 2 1  Patient at Risk for Falls Due to History of fall(s);Impaired mobility;Impaired balance/gait Impaired balance/gait;Impaired mobility;History of fall(s) No Fall Risks History of fall(s);Impaired balance/gait;Impaired mobility Impaired balance/gait;Impaired mobility  Fall risk Follow up Falls evaluation completed Falls evaluation completed   Falls evaluation completed   Functional Status Survey:    Vitals:   02/26/24 1104 02/26/24 1105  BP: (!) 146/80 (!) 144/78  Pulse: 67   Temp: 97.8 F (36.6 C)   SpO2: 96%   Weight: 171 lb 9.6 oz (77.8 kg)   Height: 5' 8 (1.727 m)    Body mass index is 26.09 kg/m. Physical Exam Vitals reviewed.  Constitutional:      Appearance: Normal appearance.  Neurological:     Mental Status: He is alert. Mental status is at baseline.  Psychiatric:        Mood and Affect: Mood normal.     Labs reviewed: Recent Labs    02/14/24 1006 02/14/24 1012 02/14/24 1026 02/15/24 0513 02/23/24 0000  NA 135 136  --  135 135*  K 4.2 4.3  --  4.2 4.2  CL 105 103  --  103 103  CO2 20*  --   --  22 20  GLUCOSE 95 97  --  90  --   BUN 13 14  --  10 17  CREATININE 0.74 0.70  --  0.65 0.7  CALCIUM  8.9   --   --  8.6* 9.0  MG  --   --  2.0 1.8  --   PHOS  --   --   --  3.9  --    Recent Labs    03/04/23 0000 02/14/24 1006 02/15/24 0513  AST 25 23 21   ALT 18 18 16  ALKPHOS 148* 126 116  BILITOT  --  0.6 0.7  PROT  --  7.5 6.4*  ALBUMIN 4.2 4.0 3.4*   Recent Labs    08/05/23 1457 02/14/24 1006 02/14/24 1012 02/15/24 0513 02/23/24 0000  WBC 6.4 6.1  --  5.4 5.0  NEUTROABS  --  3.2  --   --   --   HGB 11.6* 12.9* 12.6* 12.0* 12.2*  HCT 33.9* 36.7* 37.0* 33.5* 35*  MCV 94.2 93.4  --  91.5  --   PLT 209 209  --  186 204   Lab Results  Component Value Date   TSH 4.44 10/17/2023   Lab Results  Component Value Date   HGBA1C 5.1 02/14/2024   Lab Results  Component Value Date   CHOL 184 02/15/2024   HDL 59 02/15/2024   LDLCALC 113 (H) 02/15/2024   TRIG 59 02/15/2024   CHOLHDL 3.1 02/15/2024    Significant Diagnostic Results in last 30 days:  ECHOCARDIOGRAM COMPLETE Result Date: 02/16/2024    ECHOCARDIOGRAM REPORT   Patient Name:   Jama Elex Candy Potts Date of Exam: 02/16/2024 Medical Rec #:  [id number]              Height:       68.0 in Accession #:    [id number]             Weight:       170.0 lb Date of Birth:  1936/05/04              BSA:          1.907 m Patient Age:    88 years               BP:           153/69 mmHg Patient Gender: M                      HR:           64 bpm. Exam Location:  Inpatient Procedure: 2D Echo, Cardiac Doppler and Color Doppler (Both Spectral and Color            Flow Doppler were utilized during procedure). Indications:    Stroke I63.9  History:        Patient has prior history of Echocardiogram examinations, most                 recent 03/21/2022. Arrythmias:Atrial Fibrillation.  Sonographer:    Jayson Gaskins Referring Phys: [phone] PARDEEP KHATRI IMPRESSIONS  1. Left ventricular ejection fraction, by estimation, is 60 to 65%. The left ventricle has normal function. The left ventricle has no regional wall motion abnormalities. Left ventricular  diastolic parameters were normal.  2. Right ventricular systolic function is normal. The right ventricular size is normal.  3. Left atrial size was mild to moderately dilated.  4. The mitral valve is normal in structure. No evidence of mitral valve regurgitation. No evidence of mitral stenosis.  5. The aortic valve is normal in structure. Aortic valve regurgitation is not visualized. No aortic stenosis is present.  6. The inferior vena cava is normal in size with greater than 50% respiratory variability, suggesting right atrial pressure of 3 mmHg. Comparison(s): No significant change from prior study. Prior images reviewed side by side. FINDINGS  Left Ventricle: Left ventricular ejection fraction, by estimation, is 60 to 65%. The left ventricle has normal function. The left ventricle has no regional wall motion abnormalities.  The left ventricular internal cavity size was normal in size. There is  no left ventricular hypertrophy. Left ventricular diastolic parameters were normal. Right Ventricle: The right ventricular size is normal. No increase in right ventricular wall thickness. Right ventricular systolic function is normal. Left Atrium: Left atrial size was mild to moderately dilated. Right Atrium: Right atrial size was normal in size. Pericardium: There is no evidence of pericardial effusion. Mitral Valve: The mitral valve is normal in structure. No evidence of mitral valve regurgitation. No evidence of mitral valve stenosis. Tricuspid Valve: The tricuspid valve is normal in structure. Tricuspid valve regurgitation is not demonstrated. No evidence of tricuspid stenosis. Aortic Valve: The aortic valve is normal in structure. Aortic valve regurgitation is not visualized. No aortic stenosis is present. Aortic valve mean gradient measures 4.0 mmHg. Aortic valve peak gradient measures 6.7 mmHg. Aortic valve area, by VTI measures 2.84 cm. Pulmonic Valve: The pulmonic valve was not well visualized. Pulmonic valve  regurgitation is not visualized. No evidence of pulmonic stenosis. Aorta: The aortic root is normal in size and structure. Venous: The inferior vena cava is normal in size with greater than 50% respiratory variability, suggesting right atrial pressure of 3 mmHg. IAS/Shunts: No atrial level shunt detected by color flow Doppler. Additional Comments: A device lead is visualized in the right ventricle.  LEFT VENTRICLE PLAX 2D LVIDd:         3.80 cm   Diastology LVIDs:         3.00 cm   LV e' medial:    8.27 cm/s LV PW:         1.00 cm   LV E/e' medial:  12.3 LV IVS:        0.90 cm   LV e' lateral:   11.50 cm/s LVOT diam:     1.90 cm   LV E/e' lateral: 8.9 LV SV:         78 LV SV Index:   41 LVOT Area:     2.84 cm  RIGHT VENTRICLE RV S prime:     11.10 cm/s TAPSE (M-mode): 2.5 cm LEFT ATRIUM             Index        RIGHT ATRIUM           Index LA Vol (A2C):   68.6 ml 35.97 ml/m  RA Area:     18.80 cm LA Vol (A4C):   45.2 ml 23.70 ml/m  RA Volume:   49.00 ml  25.69 ml/m LA Biplane Vol: 60.0 ml 31.46 ml/m  AORTIC VALVE AV Area (Vmax):    2.84 cm AV Area (Vmean):   3.46 cm AV Area (VTI):     2.84 cm AV Vmax:           129.00 cm/s AV Vmean:          92.700 cm/s AV VTI:            0.274 m AV Peak Grad:      6.7 mmHg AV Mean Grad:      4.0 mmHg LVOT Vmax:         129.00 cm/s LVOT Vmean:        113.000 cm/s LVOT VTI:          0.274 m LVOT/AV VTI ratio: 1.00  AORTA Ao Root diam: 3.20 cm MITRAL VALVE MV Area (PHT): 3.39 cm     SHUNTS MV Decel Time: 224 msec     Systemic VTI:  0.27 m MV E velocity: 102.00 cm/s  Systemic Diam: 1.90 cm Jerel Croitoru MD Electronically signed by Jerel Balding MD Signature Date/Time: 02/16/2024/2:13:46 PM    Final    Overnight EEG with video Result Date: 02/15/2024 Shelton Arlin KIDD, MD     02/16/2024  9:28 AM Patient Name: Cameron Potts MRN: [id number] Epilepsy Attending: Arlin KIDD Shelton Referring Physician/Provider: Denna Mimi ORN, NP Duration: 02/14/2024 1754 to 02/15/2024 1754  Patient history:  88 y.o. 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 EEG to evaluate for seizure Level of alertness: Awake, asleep AEDs during EEG study: LEV, LCM, PHT Technical aspects: This EEG study was done with scalp electrodes positioned according to the 10-20 International system of electrode placement. Electrical activity was reviewed with band pass filter of 1-70Hz , sensitivity of 7 uV/mm, display speed of 85mm/sec with a 60Hz  notched filter applied as appropriate. EEG data were recorded continuously and digitally stored.  Video monitoring was available and reviewed as appropriate. Description: The posterior dominant rhythm consists of 7.5 Hz activity of moderate voltage (25-35 uV) seen predominantly in posterior head regions, symmetric and reactive to eye opening and eye closing. Sleep was characterized by vertex waves, sleep spindles (12 to 14 Hz), maximal frontocentral region.  EEG showed continuous polymorphic 3 to 6 Hz theta-delta slowing in right temporal region consistent with breach artifact. Spikes were noted in right anterior temporal region, predominantly during sleep. Intermittent generalized 3-5hz  theta-delta slowing was also noted. Hyperventilation and photic stimulation were not performed.   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 Additionally there is cortical dysfunction arising from right temporal  region consistent with underlying craniotomy. Lastly there was mild diffuse encephalopathy. No seizures were seen throughout the recording. Priyanka KIDD Shelton   CT ANGIO HEAD NECK W WO CM (CODE STROKE) Result Date: 02/14/2024 EXAM: CT HEAD WITHOUT CTA HEAD AND NECK WITH AND WITHOUT 02/14/2024 04:59:55 PM TECHNIQUE: CTA of the head and neck was performed with and without the administration of intravenous contrast. Noncontrast CT of the head with reconstructed 2-D images are also provided for review. Multiplanar 2D and/or 3D reformatted images are provided for review. Automated exposure control, iterative reconstruction, and/or weight based adjustment of the mA/kV was utilized to reduce the radiation dose to as low as reasonably achievable. COMPARISON: CT head without contrast 02/14/24. CLINICAL HISTORY: Neuro deficit, acute, stroke suspected. FINDINGS: CT HEAD: BRAIN AND VENTRICLES: No acute intracranial hemorrhage. No mass effect. No midline shift. No extra-axial fluid collection. No evidence of acute infarct. No hydrocephalus. ORBITS: No acute abnormality. SINUSES AND MASTOIDS: No acute abnormality. CTA NECK: AORTIC ARCH AND ARCH VESSELS: Atherosclerotic calcifications are present at the origin of the left subclavian artery without a significant stenosis. Calcifications are present in the distal arch. No aneurysm or dissection is present. CERVICAL CAROTID ARTERIES: Mural calcifications are present along the right common carotid artery. More complex calcifications are present at the right carotid bifurcation. No significant stenosis of greater than 50% is present relative to the  more distal vessels. Atherosclerotic calcifications are present at the left carotid bifurcation without significant stenosis. CERVICAL VERTEBRAL ARTERIES: A high-grade stenosis present in the origin of the right vertebral artery. No additional stenosis are present in either vertebral artery in the neck. LUNGS AND MEDIASTINUM: Unremarkable.  SOFT TISSUES: A right thyroid  nodule measures 2.5 cm. Esophagus is distended. No mass lesion is present. Bilateral subcutaneous battery packs and control units are placed over the chest. BONES: Multilevel degenerative changes are present in the cervical spine. Ankylosis is present across the disc space at C2-3 and C3-4. Slight degenerative anterolisthesis present at C4-C5. Uncovertebral spurring contributes to foraminal narrowing bilaterally at C5-6 and C6-7. CTA HEAD: ANTERIOR CIRCULATION: Right cavernous ICA stent is patent. Atherosclerotic calcifications are present in the cavernous left ICA without significant stenosis relative to the ICA terminus. High-grade stenosis are present in the proximal A1 segments bilaterally. Migraine stenosis present in the distal right M1 segment. Segmental narrowing is present throughout the ACA and MCA branch vessels. POSTERIOR CIRCULATION: High-grade stenosis are present in the proximal left P1 and P2 segments. A high-grade stenosis is present in the more distal right P2 segment. Marked attenuation of PCA branch vessels is noted, right greater than left. OTHER: No dural venous sinus thrombosis on this non-dedicated study. IMPRESSION: 1. Migraine, near occlusive, stenosis of the the mid right p2 segment corresponds with the right occipital infarct. 2. High-grade stenosis in the origin of the right vertebral artery. 3. High-grade stenosis in the proximal A1 segments bilaterally, distal right M1 segment and moderate egmental stenoses throughout the ACA and MCA branch vessels. 4. Moderate proximal left P1 and P2 segments. Marked attenuation of PCA branch vessels, right greater than left. Electronically signed by: Lonni Necessary MD 02/14/2024 05:11 PM EDT RP Workstation: [id number]   CT HEAD CODE STROKE WO CONTRAST Result Date: 02/14/2024 EXAM: CT HEAD WITHOUT 02/14/2024 04:51:03 PM TECHNIQUE: CT of the head was performed without the administration of intravenous contrast.  Automated exposure control, iterative reconstruction, and/or weight based adjustment of the mA/kV was utilized to reduce the radiation dose to as low as reasonably achievable. COMPARISON: CT head without contrast 02/14/2024 at 10:35 AM. CLINICAL HISTORY: Neuro deficit, acute, stroke suspected. FINDINGS: BRAIN AND VENTRICLES: Evolving inferior right occipital lobe infarct is noted. Right perianal craniotomy and temporal lobe resection is again noted. A remote infarct in the high medial right frontal lobe is stable. Atrophy and white matter disease, mildly advanced for age is otherwise stable. ORBITS: Lens replacement. SINUSES AND MASTOIDS: Moderate mucosal thickening is present in the maxillary sinuses bilaterally. Bilateral maxillary antrostomies are noted. Minimal fluid is present in the inferior left mastoid air cells. No obstructing nasopharyngeal lesion is present. SOFT TISSUES AND SKULL: No acute skull fracture. No acute soft tissue abnormality. VASCULATURE: Right cavernous internal carotid artery stent is in place. Atherosclerotic changes are present in the cavernous left ICA. IMPRESSION: 1. Evolving inferior right occipital lobe infarct. 2. Stable remote infarct in the high medial right frontal lobe. 3. Right perianal craniotomy and temporal lobe resection. 4. Right cavernous internal carotid artery stent in place. Atherosclerotic changes in the cavernous left ICA. 5. Mildly advanced atrophy and white matter disease, stable. Electronically signed by: Lonni Necessary MD 02/14/2024 05:01 PM EDT RP Workstation: [id number]   CT HEAD WO CONTRAST Result Date: 02/14/2024 CLINICAL DATA:  88 year old male with left eye vision changes and abnormal gait since yesterday morning. EXAM: CT HEAD WITHOUT CONTRAST TECHNIQUE: Contiguous axial images were obtained from the base of the skull  through the vertex without intravenous contrast. RADIATION DOSE REDUCTION: This exam was performed according to the departmental  dose-optimization program which includes automated exposure control, adjustment of the mA and/or kV according to patient size and/or use of iterative reconstruction technique. COMPARISON:  Head CT 08/05/2023. FINDINGS: Brain: Chronic right middle cranial fossa resection cavity, subtotal absence of the right temporal lobe. Stable cerebral volume from earlier this year. New confluent hypodensity in the right occipital pole since February (coronal image 15 and series 5, image 26), along the inferior calcarine sulcus. No associated hemorrhage or mass effect. See sagittal image 26. No other acute cortically based infarct identified. Stable encephalomalacia in the distal right ACA territory, right cingulate gyrus (coronal image 43). No superimposed No midline shift, ventriculomegaly, mass effect, acute intracranial hemorrhage. Vascular: Calcified atherosclerosis at the skull base. No suspicious intracranial vascular hyperdensity. Skull: Chronic right frontotemporal craniotomy. No acute osseous abnormality identified. Sinuses/Orbits: Chronic paranasal sinus disease with up to moderate mucoperiosteal thickening bilaterally has not significantly changed. Tympanic cavities and mastoids remain well aerated. Other: No acute orbit or scalp soft tissue finding. Chronic postoperative changes to both globes. IMPRESSION: 1. Positive for small acute or subacute Right PCA territory infarct affecting the inferior right occipital pole. Correlate for Left visual field deficits. No hemorrhagic transformation or mass effect. 2. Otherwise stable including chronic distal right ACA infarct, chronic right middle cranial fossa resection. Chronic paranasal sinus disease. Electronically Signed   By: VEAR Hurst M.D.   On: 02/14/2024 10:48   CUP PACEART REMOTE DEVICE CHECK Result Date: 01/29/2024 ICD Monthly battery check.  Estimated 4 months. Normal device function. No new alerts. 6 AT/AF episodes, all on 01/13/24 between 14:48 and 16:56, longest  54 min 15 sec, burden 0.3% since 12/29/23, not on OAC due to contraindication with low occurrence per Epic. Follow up as scheduled monthly. [facility], CVRS ICD Monthly battery check.  Estimated 4 months. Normal device function. No new alerts. 6 AT/AF episodes, all on 01/13/24 between 14:48 and 16:56, longest 54 min 15 sec, V rates > 100 bpm ~ 35%, burden 0.3% since 12/29/23, not on OAC due to contraindication with low occurrence per Epic. Follow up as scheduled monthly. MC, CVRS   Assessment/Plan   1. Mixed hyperlipidemia We discussed options. He could consider a lower intensity statin, taking lipitor every other day, or trying zetia. Mr. Richert has declined to take any therapy and will discuss it with his cardiologist at his next apt. I do feel like he is at risk given his cardiac hx and findings on CT scan. He is aware there increased risk of cardiovascular or neurologic event without taking a statin. He is informed in his decision to hold off. His wife is with him and she agrees. They will follow up neurology and cardiology regarding this concern.    Total time :  time greater than 50% of total time spent doing pt counseling and coordination of care

## 2024-03-01 ENCOUNTER — Ambulatory Visit (INDEPENDENT_AMBULATORY_CARE_PROVIDER_SITE_OTHER)

## 2024-03-01 DIAGNOSIS — I495 Sick sinus syndrome: Secondary | ICD-10-CM

## 2024-03-02 LAB — CUP PACEART REMOTE DEVICE CHECK
Battery Remaining Longevity: 3 mo
Battery Voltage: 2.8 V
Brady Statistic AP VP Percent: 0.19 %
Brady Statistic AP VS Percent: 99.01 %
Brady Statistic AS VP Percent: 0.02 %
Brady Statistic AS VS Percent: 0.78 %
Brady Statistic RA Percent Paced: 99.19 %
Brady Statistic RV Percent Paced: 0.21 %
Date Time Interrogation Session: 20250915093322
HighPow Impedance: 73 Ohm
Lead Channel Impedance Value: 399 Ohm
Lead Channel Impedance Value: 418 Ohm
Lead Channel Impedance Value: 513 Ohm
Lead Channel Pacing Threshold Amplitude: 0.875 V
Lead Channel Pacing Threshold Amplitude: 1.75 V
Lead Channel Pacing Threshold Pulse Width: 0.4 ms
Lead Channel Pacing Threshold Pulse Width: 0.4 ms
Lead Channel Sensing Intrinsic Amplitude: 1.875 mV
Lead Channel Sensing Intrinsic Amplitude: 1.875 mV
Lead Channel Sensing Intrinsic Amplitude: 4.375 mV
Lead Channel Sensing Intrinsic Amplitude: 4.375 mV
Lead Channel Setting Pacing Amplitude: 1.75 V
Lead Channel Setting Pacing Amplitude: 3.5 V
Lead Channel Setting Pacing Pulse Width: 0.4 ms
Lead Channel Setting Sensing Sensitivity: 0.3 mV
Zone Setting Status: 755011

## 2024-03-05 DIAGNOSIS — N401 Enlarged prostate with lower urinary tract symptoms: Secondary | ICD-10-CM | POA: Diagnosis not present

## 2024-03-05 DIAGNOSIS — R351 Nocturia: Secondary | ICD-10-CM | POA: Diagnosis not present

## 2024-03-05 DIAGNOSIS — R350 Frequency of micturition: Secondary | ICD-10-CM | POA: Diagnosis not present

## 2024-03-08 NOTE — Progress Notes (Signed)
Remote ICD Transmission.

## 2024-03-13 ENCOUNTER — Ambulatory Visit: Payer: Self-pay | Admitting: Cardiovascular Disease

## 2024-03-16 ENCOUNTER — Other Ambulatory Visit: Payer: Self-pay | Admitting: Internal Medicine

## 2024-03-16 DIAGNOSIS — Z23 Encounter for immunization: Secondary | ICD-10-CM | POA: Diagnosis not present

## 2024-03-16 MED ORDER — LORAZEPAM 2 MG/ML IJ SOLN: 0.5000 mg | Freq: Every day | INTRAMUSCULAR | 0 refills | Status: DC | PRN

## 2024-03-18 ENCOUNTER — Ambulatory Visit: Attending: Cardiovascular Disease | Admitting: Cardiovascular Disease

## 2024-03-18 ENCOUNTER — Encounter: Payer: Self-pay | Admitting: Cardiovascular Disease

## 2024-03-18 VITALS — BP 137/76 | HR 61 | Ht 68.0 in | Wt 172.0 lb

## 2024-03-18 DIAGNOSIS — Z5181 Encounter for therapeutic drug level monitoring: Secondary | ICD-10-CM | POA: Diagnosis not present

## 2024-03-18 DIAGNOSIS — E871 Hypo-osmolality and hyponatremia: Secondary | ICD-10-CM | POA: Diagnosis not present

## 2024-03-18 DIAGNOSIS — I2510 Atherosclerotic heart disease of native coronary artery without angina pectoris: Secondary | ICD-10-CM | POA: Diagnosis not present

## 2024-03-18 DIAGNOSIS — Z8679 Personal history of other diseases of the circulatory system: Secondary | ICD-10-CM | POA: Insufficient documentation

## 2024-03-18 DIAGNOSIS — E7800 Pure hypercholesterolemia, unspecified: Secondary | ICD-10-CM | POA: Diagnosis not present

## 2024-03-18 DIAGNOSIS — G20C Parkinsonism, unspecified: Secondary | ICD-10-CM | POA: Diagnosis not present

## 2024-03-18 DIAGNOSIS — I447 Left bundle-branch block, unspecified: Secondary | ICD-10-CM | POA: Diagnosis not present

## 2024-03-18 DIAGNOSIS — I4719 Other supraventricular tachycardia: Secondary | ICD-10-CM | POA: Diagnosis not present

## 2024-03-18 DIAGNOSIS — G40909 Epilepsy, unspecified, not intractable, without status epilepticus: Secondary | ICD-10-CM | POA: Diagnosis not present

## 2024-03-18 DIAGNOSIS — Z79899 Other long term (current) drug therapy: Secondary | ICD-10-CM | POA: Insufficient documentation

## 2024-03-18 DIAGNOSIS — I495 Sick sinus syndrome: Secondary | ICD-10-CM | POA: Insufficient documentation

## 2024-03-18 DIAGNOSIS — I440 Atrioventricular block, first degree: Secondary | ICD-10-CM | POA: Insufficient documentation

## 2024-03-18 DIAGNOSIS — Z4502 Encounter for adjustment and management of automatic implantable cardiac defibrillator: Secondary | ICD-10-CM | POA: Insufficient documentation

## 2024-03-18 DIAGNOSIS — I951 Orthostatic hypotension: Secondary | ICD-10-CM | POA: Insufficient documentation

## 2024-03-18 NOTE — Patient Instructions (Addendum)
 Medication Instructions:   No  changes *If you need a refill on your cardiac medications before your next appointment, please call your pharmacy*   Lab Work: after Apr 21, 2024  CBC BMP  If you have labs (blood work) drawn today and your tests are completely normal, you will receive your results only by: MyChart Message (if you have MyChart) OR A paper copy in the mail If you have any lab test that is abnormal or we need to change your treatment, we will call you to review the results.   Testing/Procedures:Schedule for May 20, 2024 at 1:30 pm Your physician has recommended that you have an ICD generator change to  pacemaker inserted. A pacemaker is a small device that is placed under the skin of your chest or abdomen to help control abnormal heart rhythms. This device uses electrical pulses to prompt the heart to beat at a normal rate. Pacemakers are used to treat heart rhythms that are too slow. Wire (leads) are attached to the pacemaker that goes into the chambers of you heart. This is done in the hospital and usually requires and overnight stay. Please see the instruction sheet given to you today for more information.    Follow-Up: At [facility], you and your health needs are our priority.  As part of our continuing mission to provide you with exceptional heart care, we have created designated Provider Care Teams.  These Care Teams include your primary Cardiologist (physician) and Advanced Practice Providers (APPs -  Physician Assistants and Nurse Practitioners) who all work together to provide you with the care you need, when you need it.     Your next appointment:     Week of 05/03/24 or 05/10/24   The format for your next appointment:   Virtual Visit   Provider:   Jerel Balding, MD   Also 3 month  visit after Dec 4,2025 for pacer check  ( in person )  Other Instructions      Implantable Device Instructions    Pearl Berlinger III  03/18/2024  You are scheduled  for a ICD generator change to a Permanent transvenous pacemaker (PPM)  on Thursday, December 4 with Dr. Jerel Croitoru.  1. Pre procedure Lab testing: CBC,BMP   after Nov 5 , 2025   2. Please arrive at the Main Entrance A at [facility]: [address] [place], [place] [address] on December 4 at 11:30 AM (This time is two hours before your procedure to ensure your preparation). Free valet parking service is available. You will check in at ADMITTING. The support person will be asked to wait in the waiting room.  It is OK to have someone drop you off and come back when you are ready to be discharged.        Special note: Every effort is made to have your procedure done on time. Please understand that emergencies sometimes delay  scheduled procedures.  3.  No eating or drinking after midnight prior to procedure.     4.  Medication instructions:  On the morning of your procedure take only Flecainide *and seizure medications.   5.  The night before your procedure and the morning of your procedure scrub your neck/chest with CHG surgical scrub.  See instruction letter.  6. Plan to go home the same day, you will only stay overnight if medically necessary. 7.  You MUST have a responsible adult to drive you home. 8.   An adult MUST be with  you the first 24 hours after you arrive home. 9..  Bring a current list of your medications, and the last time and date medication taken. 10. Bring ID and current insurance cards. 11. .Please wear clothes that are easy to get on and off and wear slip-on shoes.    You will follow up with the [facility] Device clinic 10-14 days after your procedure.  You will follow up with Dr. Jerel Croitoru 91 days after your procedure.  These appointments will be made for you.   * If you have ANY questions after you get home, please call the office at [phone] or send a MyChart message.  FYI: For your safety, and to allow us  to monitor your vital signs accurately  during the surgery/procedure we request that if you have artificial nails, gel coating, SNS etc. Please have those removed prior to your surgery/procedure. Not having the nail coverings /polish removed may result in cancellation or delay of your surgery/procedure.    [place] - Preparing For Surgery    Before surgery, you can play an important role. Because skin is not sterile, your skin needs to be as free of germs as possible. You can reduce the number of germs on your skin by washing with CHG (chlorahexidine gluconate) Soap before surgery.  CHG is an antiseptic cleaner which kills germs and bonds with the skin to continue killing germs even after washing.  Please do not use if you have an allergy to CHG or antibacterial soaps.  If your skin becomes reddened/irritated stop using the CHG.   Do not shave (including legs and underarms) for at least 48 hours prior to first CHG shower.  It is OK to shave your face.  Please follow these instructions carefully:  1.  Shower the night before surgery and the morning of surgery with CHG.  2.  If you choose to wash your hair, wash your hair first as usual with your normal shampoo.  3.  After you shampoo, rinse your hair and body thoroughly to remove the shampoo.  4.  Use CHG as you would any other liquid soap.  You can apply CHG directly to the skin and wash gently with a clean washcloth. 5.  Apply the CHG Soap to your body ONLY FROM THE NECK DOWN.  Do not use on open wounds or open sores.  Avoid contact with your eyes, ears, mouth and genitals (private parts).  Wash genitals (private parts) with your normal soap.  6.  Wash thoroughly, paying special attention to the area where your surgery will be performed.  7.  Thoroughly rinse your body with warm water from the neck down.   8.  DO NOT shower/wash with your normal soap after using and rinsing off the CHG soap.  9.  Pat yourself dry with a clean towel.           10.  Wear clean pajamas.           11.   Place clean sheets on your bed the night of your first shower and do not sleep with pets.  Day of Surgery: Do not apply any deodorants/lotions.  Please wear clean clothes to the hospital/surgery center.

## 2024-03-18 NOTE — Progress Notes (Signed)
 Cardiology Office Note:    Date:  03/18/2024   ID:  Cameron Potts, DOB [birth year], MRN [id number]  PCP:  Charlanne Fredia CROME, MD   [facility] Providers Cardiologist:  Jerel Balding, MD     Referring MD: Charlanne Fredia CROME, MD   No chief complaint on file.   History of Present Illness:    Cameron Potts is a 88 y.o. male with a hx of paroxysmal atrial flutter and an episode of cardiac arrest (per limited documents available this apparently occurred not long after a right hippocampal mass resection complicated by seizures and cardiac arrest in 2008), which led to ICD implantation.  In 2015 he had an inappropriate defibrillator discharge for atrial flutter with rapid ventricular response.  He underwent ICD generator change out in 2016.  He has recurrent problems with hyponatremia, oral salt tablets.  He is accompanied today by his wife.  He is generally doing well from a cardiac point of view, although he did have some new neurological problems recently.  In late August, he he was hospitalized for worsening visual changes and CT showed a small acute/subacute right PCA territory infarct in the inferior right occipital pole.  At hospital discharge his sodium level was normal at 135.  He was prescribed aspirin  and Plavix  for 3 weeks, then aspirin  monotherapy.  Later after returning to [facility] he was less compliant with his sodium chloride  prescription.  After going back on the usual prescription, his symptoms of worsening weakness weakness, visual changes and slight disorientation have resolved.  I do not have his most recent sodium level.  He has not had any issues of shortness of breath, chest pain or syncope, but he remains quite unsteady on his feet and prone to falls.  Sometimes he is able to walk with a walker, but sometimes he feels too weak to get out of a chair.  He has not had recent seizures.  I asked him to come in today since his ICD is close to end of battery  service.  Estimate less than a month to RRT.  His current device is a Scientist, product/process development.  He has 97% atrial pacing, with a heart rate histogram but is appropriate for sedentary lifestyle.  He only has 1.5% ventricular pacing.  He is not pacemaker dependent.  He has had occasional episodes of atrial tachycardia/slow atrial flutter, typically with an atrial rate of 170 bpm and 2: 1 AV conduction, therefore never associated with high ventricular rates.  He has not had any ventricular tachycardia.  The overall burden of atrial mode switch is only 0.2%.  Interestingly, many of the spells have happened just in the last week.  Lead parameters are all normal.  His atrial lead is a Medtronic 5076-lead implanted in 2008.  His defibrillator RV lead is a Estate manager/land agent DF 1-lead which was implanted in 2008.  Cameron Potts lived in [place] for many years after which he retired to [place]  and received care at the [facility] in [place].  For the last 5 years he has been living in [place], his wife's native town.  He is a resident at [facility].  He has continued receiving some of his medical care in the [facility] in [place] .  Despite numerous hospitalizations for epilepsy and hyponatremia since 2019, there have been no active cardiac issues.   Even though he has reportedly had atrial flutter in the past and was prescribed warfarin (direct oral anticoagulants not possible  due to his seizure medicines) at a previous appointment we stopped treatment with warfarin since we have not seen any significant arrhythmia since 2015.  He continues to be prone to falls, but he has not had any serious bleeding events.  He has not had any focal neurological events consistent with stroke or TIA.  His device has now shown some regular and slow atrial flutter (more likely atrial tachycardia) at 170 bpm, which I do not think is associated with high thromboembolic risk.  He is on chronic treatment with flecainide  50 mg twice  daily but is not receiving a beta-blocker (due to orthostatic hypotension).  His device is a Medtronic Evera XT device (model number DD BB 1 D1), implanted as a generator change out in 2016.  The ventricular lead is a Retail buyer lead implanted in 2008 (model not clear).  The atrial lead is a Medtronic 5076-52 cm lead also implanted in 2008.  Cannot comment on data from his original generator which she had from 2008 until 2016, but the current device has never delivered therapy for ventricular tachycardia or ventricular fibrillation since its implantation in 2016.  He has a history of drug-resistant focal epilepsy for which he underwent a right anterior temporal lobectomy in 2008.  He underwent implantation of a vagal nerve stimulator in the right subclavian area for this in 2016.  This led to some improvement, but he has had multiple breakthrough seizures since, including intractable seizures requiring numerous episodes of hospitalization.  He also carries a diagnosis of parkinsonism and associated orthostatic hypotension. Head CT shows sequelae of previous right temporal lobectomy as well as a unchanged small chronic infarct involving the right cingulate gyrus comparison to 2020.  In August 2025 he had a new small acute/subacute infarct in the PCA distribution, left inferior optic pole.  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 Swallow whole.   brimonidine -timolol  (COMBIGAN ) 0.2-0.5 % ophthalmic solution Place 1 drop into both eyes 2 (two) times daily.   cholecalciferol (VITAMIN D ) 400 units TABS tablet Take 400 Units by mouth 2 (two) times daily.   finasteride  (PROSCAR ) 5 MG tablet Take 5 mg by mouth every morning.   flecainide  (TAMBOCOR ) 50 MG tablet Take 50 mg by mouth 2 (two) times daily.   lacosamide  (VIMPAT ) 200 MG TABS tablet  Take 1 tablet (200 mg total) by mouth 2 (two) times daily.   latanoprost  (XALATAN ) 0.005 % ophthalmic solution Place 1 drop into both eyes at bedtime.   Levetiracetam  750 MG TB24 Take 2,250 mg by mouth every evening.   phenytoin  (DILANTIN ) 100 MG ER capsule Take 200 mg by mouth daily. 2 Capsules in the evening   phenytoin  (DILANTIN ) 100 MG ER capsule Take 100 mg by mouth in the morning.   polyethylene glycol (MIRALAX  / GLYCOLAX ) 17 g packet Take 17 g by mouth every other day.   sodium chloride  1 g tablet Take 2 tablets (2 g total) by mouth 3 (three) times daily.   tamsulosin  (FLOMAX ) 0.4 MG CAPS capsule Take 0.4 mg by mouth in the morning.     Allergies:   Depakote [divalproex sodium]   Social History   Socioeconomic History   Marital status: Married    Spouse name: Slater   Number of children: 3   Years of education: MBA   Highest education level: Not on file  Occupational History   Occupation: Retired   Occupation: Field seismologist  Tobacco Use   Smoking status: Never   Smokeless tobacco: Never   Tobacco comments:    quit 1960s  Vaping Use   Vaping status: Never Used  Substance and  Sexual Activity   Alcohol use: Not Currently    Comment: former beer drinker   Drug use: No   Sexual activity: Not Currently  Other Topics Concern   Not on file  Social History Narrative   Lives Assisted Living, [facility]   Right Handed   Drinks 2-3 cups caffeine daily   Social Drivers of Health   Financial Resource Strain: Low Risk  (04/15/2018)   Overall Financial Resource Strain (CARDIA)    Difficulty of Paying Living Expenses: Not hard at all  Food Insecurity: No Food Insecurity (02/14/2024)   Hunger Vital Sign    Worried About Running Out of Food in the Last Year: Never true    Ran Out of Food in the Last Year: Never true  Transportation Needs: No Transportation Needs (02/14/2024)   PRAPARE - Administrator, Civil Service (Medical): No    Lack of Transportation (Non-Medical): No  Physical Activity: Sufficiently Active (04/15/2018)   Exercise Vital Sign    Days of Exercise per Week: 5 days    Minutes of Exercise per Session: 30 min  Stress: No Stress Concern Present (04/15/2018)   [employer] of Occupational Health - Occupational Stress Questionnaire    Feeling of Stress : Not at all  Social Connections: Moderately Isolated (02/14/2024)   Social Connection and Isolation Panel    Frequency of Communication with Friends and Family: More than three times a week    Frequency of Social Gatherings with Friends and Family: More than three times a week    Attends Religious Services: Never    Database administrator or Organizations: No    Attends Banker Meetings: Never    Marital Status: Married     Family History: The patient's family history includes Cancer - Lung in his mother; Cancer - Prostate in his father.  ROS:   Please see the history of present illness.     All other systems reviewed and are negative.  EKGs/Labs/Other Studies Reviewed:    The following studies were reviewed today: Review of multiple imaging studies from recent  hospitalizations in [place] Review of hospitalization records and notes from [facility], [place]  [place]   EKG:    EKG Interpretation Date/Time:  Thursday March 18 2024 09:56:56 EDT Ventricular Rate:  61 PR Interval:  192 QRS Duration:  242 QT Interval:  554 QTC Calculation: 557 R Axis:   -73  Text Interpretation: Atrial-sensed ventricular-paced rhythm with occasional Premature ventricular complexes When compared with ECG of 14-Feb-2024 11:17, PREVIOUS ECG IS PRESENT Confirmed by Astin Rape ([id number]) on 03/18/2024 10:14:40 AM         Recent Labs: 10/17/2023: TSH 4.44 02/15/2024: ALT 16; Magnesium 1.8 02/23/2024: BUN 17; Creatinine 0.7; Hemoglobin 12.2; Platelets 204; Potassium 4.2; Sodium 135  Recent Lipid Panel    Component Value Date/Time   CHOL 184 02/15/2024 0513   TRIG 59 02/15/2024 0513   HDL 59 02/15/2024 0513   CHOLHDL 3.1 02/15/2024 0513   VLDL 12 02/15/2024 0513   LDLCALC 113 (H) 02/15/2024 0513     Risk Assessment/Calculations:          Physical Exam:    VS:  BP 137/76 (BP Location: Left Arm, Patient Position: Sitting, Cuff Size: Normal)   Pulse 61   Ht 5' 8 (1.727 m)   Wt 172 lb (78 kg)   SpO2 100%   BMI 26.15 kg/m     Wt Readings from Last 3 Encounters:  03/18/24 172 lb (78 kg)  02/26/24 171 lb 9.6 oz (77.8 kg)  02/23/24 171 lb 9.6 oz (77.8 kg)     General: Alert, oriented x3, no distress, healthy defibrillator site. Head: no evidence of trauma, PERRL, EOMI, no exophtalmos or lid lag, no myxedema, no xanthelasma; normal ears, nose and oropharynx Neck: normal jugular venous pulsations and no hepatojugular reflux; brisk carotid pulses without delay and no carotid bruits Chest: clear to auscultation, no signs of consolidation by percussion or palpation, normal fremitus, symmetrical and full respiratory excursions Cardiovascular: normal position and quality of the apical impulse, regular rhythm, normal first and second heart sounds, no  murmurs, rubs or gallops Abdomen: no tenderness or distention, no masses by palpation, no abnormal pulsatility or arterial bruits, normal bowel sounds, no hepatosplenomegaly Extremities: no clubbing, cyanosis or edema; 2+ radial, ulnar and brachial pulses bilaterally; 2+ right femoral, posterior tibial and dorsalis pedis pulses; 2+ left femoral, posterior tibial and dorsalis pedis pulses; no subclavian or femoral bruits Neurological: grossly nonfocal, except that he keeps his head hanging down throughout the entire interview. Psych: Normal mood and affect   ASSESSMENT:    1. Paroxysmal atrial tachycardia   2. History of atrial flutter   3. SSS (sick sinus syndrome) (HCC)   4. First degree AV block   5. LBBB (left bundle branch block)   6. Encounter for monitoring flecainide  therapy   7. Coronary artery calcification seen on CAT scan   8. Hypercholesterolemia   9. ICD (implantable cardioverter-defibrillator) battery depletion   10. Hyponatremia   11. Seizure disorder (HCC)   12. Parkinsonian syndrome associated with symptomatic orthostatic hypotension (HCC)    PLAN:    In order of problems listed above:  History of atrial flutter/tachycardia: Very low burden of what is likely atrial tachycardia.  He had an inappropriate defibrillator discharge for atrial flutter with 1: 1 AV conduction in 2015, by report.  Since his current defibrillator generator was implanted in 2016, have not been able to see an actual recording.  I suspect that the current episodes of paroxysmal atrial tachycardia at 171 bpm, now conducted with 2: 1 AV block may be the same arrhythmia.  I think this has low  thromboembolic risk since it probably is associated with coherent mechanical atrial contraction.  He has had almost none of these episodes in the last 10 years, until just a few days ago.  His recent stroke would be in an atypical distribution for atrial fibrillation and it occurred weeks before we detected the  arrhythmia.  None has been detected since 2015 when he had an appropriate defibrillator discharge.  I remain of the opinion that the risk of disastrous intracranial bleeding associated with falls is a bigger risk to him than that of thromboembolism due to atrial arrhythmia. Acquired thrombophilia:   He cannot take direct oral anticoagulants due to his seizure medicines.  The risk of warfarin related bleeding exceeds the benefit.  We have seen a very low burden of arrhythmia on his pacemaker. SSS/AV conduction disease: For the most part he has atrial paced rhythm and should be considered atrially pacemaker dependent.  I suspect that the 3.2% of the time when he does not have atrial pacing he may be having premature atrial contractions.  He does have a junctional escape rhythm but this is quite slow, less than 30 bpm.  During atrial pacing he has normal 1: 1 AV conduction, albeit with a very long AV delay of about 400 ms.  Despite this he rarely requires ventricular pacing.  During atrial tachycardia at 130-170 bpm he only conducts 2: 1, thereby having spontaneously rate controlled arrhythmia. LBBB: Seems to be due to age-related conduction system disease rather than any structural abnormalities.  Essentially had a normal echo in October 2023. Flecainide : This has not been associated with any episodes of prolonged arrhythmia to date.  Prescribed after he developed atrial flutter with 1:1 AV conduction, but unable to use AV nodal blocking agents due to severe orthostatic hypotension.  No known clinical history of CAD or PAD, but these are definitely possible at his age.  No evidence of significant structural heart disease by echo, at this point continuing the flecainide  appears to be a reasonable decision.   Coronary artery calcifications:  Severe atheromatous calcifications in the coronary arteries described on CT. he has never had angina pectoris, intermittent claudication or any other signs of advanced  vascular disease.  Not currently on lipid-lowering therapy.  Considering his age and comorbid conditions, uncertain what the lipid-lowering therapy would make much of a difference at this point. HLP: Send LDL cholesterol was only slightly higher than desirable at 113.  He does not have diabetes mellitus and has never had clinically relevant vascular problems.  He takes a very long list of medications with many potential drug interactions, for his seizures.  I do not think lipid-lowering therapy is indicated. ICD battery depletion: Normal device function.  He has never received appropriate VT/VF intervention (did have inappropriate ICD shock for atrial flutter with high ventricular rate in 2015, on his old generator).  He needs pacemaker therapy for both sinus node dysfunction and for possible future worsening AV conduction disease.  In contradistinction, I do not think he requires ICD therapies: He has never received appropriate VT therapies, he has normal left ventricular function, he is now 88 years old.  I went over these issues in quite a bit of detail with the patient and his wife.  I told him that I recommend downgrading the ICD generator to a dual-chamber pacemaker and they both agreed.  In addition I recommended implantation of a Tyrex antibiotic pouch at the time of generator change out.  He is at increased risk of  infection since this will be the third surgery in that area and device infection could have devastating consequences since he is atrially dependent. Hyponatremia: Seems to be well compensated as long as he takes all of the prescribed salt tablets.  May be related to SIADH or chronic treatment with levetiracetam , but his seizures have been very difficult to manage, so I do not think this medication can be stopped.  Does not appear to have adrenal insufficiency (baseline cortisol level 20.5, albeit with a paradoxical drop in response to ACTH  on labs performed 01/26/2022).  He has tolerated  salt loading without any signs of heart failure clinically.  Occasionally his OptiVol has been briefly out of range, but it has been stable for the most part. Seizure disorder:  He is on 3 different antiepileptic medications and has a vagal nerve stimulator.  He has had breakthrough seizures despite this.  Has history of right temporal lobectomy. Orthostatic hypotension: Blood pressure is normal.  We should tolerate even mild hypertension up to as much as 160 mmHg since he has a tendency to drop his blood pressure.  Has had numerous falls, and the volatile blood pressure is probably related to his parkinsonism syndrome, and salt wasting/hyponatremia.  Avoid perfect control of his blood pressure.  And to avoid sudden changes in position.  He may need compression stockings or reminded abdominal binder if symptoms are not well-controlled.         This procedure (ICD generator change I would downgrade to dual-chamber permanent pacemaker) has been fully reviewed with the patient and and with his wife, informed consent has been obtained.   Medication Adjustments/Labs and Tests Ordered: Current medicines are reviewed at length with the patient today.  Concerns regarding medicines are outlined above.  Orders Placed This Encounter  Procedures   Basic metabolic panel with GFR   CBC   EKG 12-Lead   No orders of the defined types were placed in this encounter.   Patient Instructions  Medication Instructions:   No  changes *If you need a refill on your cardiac medications before your next appointment, please call your pharmacy*   Lab Work: after Apr 21, 2024  CBC BMP  If you have labs (blood work) drawn today and your tests are completely normal, you will receive your results only by: MyChart Message (if you have MyChart) OR A paper copy in the mail If you have any lab test that is abnormal or we need to change your treatment, we will call you to review the  results.   Testing/Procedures:Schedule for May 20, 2024 at 1:30 pm Your physician has recommended that you have an ICD generator change to  pacemaker inserted. A pacemaker is a small device that is placed under the skin of your chest or abdomen to help control abnormal heart rhythms. This device uses electrical pulses to prompt the heart to beat at a normal rate. Pacemakers are used to treat heart rhythms that are too slow. Wire (leads) are attached to the pacemaker that goes into the chambers of you heart. This is done in the hospital and usually requires and overnight stay. Please see the instruction sheet given to you today for more information.    Follow-Up: At [facility], you and your health needs are our priority.  As part of our continuing mission to provide you with exceptional heart care, we have created designated Provider Care Teams.  These Care Teams include your primary Cardiologist (physician) and Advanced Practice Providers (APPs -  Physician Assistants and Nurse Practitioners) who all work together to provide you with the care you need, when you need it.     Your next appointment:     Week of 05/03/24 or 05/10/24   The format for your next appointment:   Virtual Visit   Provider:   Jerel Balding, MD   Also 3 month  visit after Dec 4,2025 for pacer check  ( in person )  Other Instructions      Implantable Device Instructions    Maximum Reiland Potts  03/18/2024  You are scheduled for a ICD generator change to a Permanent transvenous pacemaker (PPM)  on Thursday, December 4 with Dr. Jerel Aalyiah Camberos.  1. Pre procedure Lab testing: CBC,BMP   after Nov 5 , 2025   2. Please arrive at the Main Entrance A at [facility]: [address] [place], [place] [address] on December 4 at 11:30 AM (This time is two hours before your procedure to ensure your preparation). Free valet parking service is available. You will check in at ADMITTING. The support person will be  asked to wait in the waiting room.  It is OK to have someone drop you off and come back when you are ready to be discharged.        Special note: Every effort is made to have your procedure done on time. Please understand that emergencies sometimes delay  scheduled procedures.  3.  No eating or drinking after midnight prior to procedure.     4.  Medication instructions:  On the morning of your procedure take only Flecainide *and seizure medications.   5.  The night before your procedure and the morning of your procedure scrub your neck/chest with CHG surgical scrub.  See instruction letter.  6. Plan to go home the same day, you will only stay overnight if medically necessary. 7.  You MUST have a responsible adult to drive you home. 8.   An adult MUST be with you the first 24 hours after you arrive home. 9..  Bring a current list of your medications, and the last time and date medication taken. 10. Bring ID and current insurance cards. 11. .Please wear clothes that are easy to get on and off and wear slip-on shoes.    You will follow up with the [facility] Device clinic 10-14 days after your procedure.  You will follow up with Dr. Jerel Lasonia Casino 91 days after your procedure.  These appointments will be made for you.   * If you have ANY questions after you get home, please call the office at [phone] or send a MyChart message.  FYI: For your safety, and to allow us  to monitor your vital signs accurately during the surgery/procedure we request that if you have artificial nails, gel coating, SNS etc. Please have those removed prior to your surgery/procedure. Not having the nail coverings /polish removed may result in cancellation or delay of your surgery/procedure.    [place] - Preparing For Surgery    Before surgery, you can play an important role. Because skin is not sterile, your skin needs to be as free of germs as possible. You can reduce the number of germs on your skin by washing  with CHG (chlorahexidine gluconate) Soap before surgery.  CHG is an antiseptic cleaner which kills germs and bonds with the skin to continue killing germs even after washing.  Please do not use if you have an allergy to CHG or antibacterial soaps.  If your skin becomes reddened/irritated stop using the CHG.   Do not shave (including legs and underarms) for at least 48 hours prior to first CHG shower.  It is OK to shave your face.  Please follow these instructions carefully:  1.  Shower the night before surgery and the morning of surgery with CHG.  2.  If you choose to wash your hair, wash your hair first as usual with your normal shampoo.  3.  After you shampoo, rinse your hair and body thoroughly to remove the shampoo.  4.  Use CHG as you would any other liquid soap.  You can apply CHG directly to the skin and wash gently with a clean washcloth. 5.  Apply the CHG Soap to your body ONLY FROM THE NECK DOWN.  Do not use on open wounds or open sores.  Avoid contact with your eyes, ears, mouth and genitals (private parts).  Wash genitals (private parts) with your normal soap.  6.  Wash thoroughly, paying special attention to the area where your surgery will be performed.  7.  Thoroughly rinse your body with warm water from the neck down.   8.  DO NOT shower/wash with your normal soap after using and rinsing off the CHG soap.  9.  Pat yourself dry with a clean towel.           10.  Wear clean pajamas.           11.  Place clean sheets on your bed the night of your first shower and do not sleep with pets.  Day of Surgery: Do not apply any deodorants/lotions.  Please wear clean clothes to the hospital/surgery center.    Signed, Jerel Balding, MD  03/18/2024 5:57 PM    [facility]

## 2024-03-22 ENCOUNTER — Encounter: Payer: Self-pay | Admitting: Neurology

## 2024-03-22 ENCOUNTER — Ambulatory Visit: Admitting: Neurology

## 2024-03-22 VITALS — BP 173/79 | HR 82 | Resp 17 | Ht 68.0 in | Wt 178.0 lb

## 2024-03-22 DIAGNOSIS — Z9689 Presence of other specified functional implants: Secondary | ICD-10-CM | POA: Diagnosis not present

## 2024-03-22 DIAGNOSIS — G40211 Localization-related (focal) (partial) symptomatic epilepsy and epileptic syndromes with complex partial seizures, intractable, with status epilepticus: Secondary | ICD-10-CM | POA: Diagnosis not present

## 2024-03-22 DIAGNOSIS — R269 Unspecified abnormalities of gait and mobility: Secondary | ICD-10-CM

## 2024-03-22 DIAGNOSIS — Z5181 Encounter for therapeutic drug level monitoring: Secondary | ICD-10-CM | POA: Diagnosis not present

## 2024-03-22 NOTE — Patient Instructions (Addendum)
 Please continue with Dilantin  100 mg in the morning and 200 mg in the morning  We will keep Dilantin  level between 10 to 20  Continue with Lacosamide  200 mg twice daily  Continue with Keppra  XR 2250 mg daily  Please check Dilantin  level monthly.  If level below 10, please give additional 100 mg Dilantin  in the morning every other day an recheck Dilantin  level in a week If level above 20, please decrease morning Dilantin  to 50 mg every other morning for a week then recheck Dilantin  level in a week  Follow up in 6 months or sooner if worse

## 2024-03-22 NOTE — Progress Notes (Signed)
 Reason for visit: Intractable seizures, gait disorder  Cameron Potts is an 88 y.o. male  INTERVAL HISTORY 03/22/2024 Patient presents today for follow-up, he is accompanied by wife.  Last visit was in March, at that time we planned to continue him on Dilantin  75 mg in the morning and 200 mg at night.  He was admitted to the hospital due to difficulty reading and aphasia; CT scan showed possible stroke but per wife this was not a stroke, likely his typical focal seizures. Could not get a MRI due to his VNS leads. He was recommended Dilantin  200 mg twice daily at discharge but he is back to taking 100 mg in the morning and 200 mg at night.  Has not had any additional seizures.  He tells me with this dose he feels fine no new or worsening symptoms.   Interval history 09/04/2023:  Patient presents today for follow-up, he is accompanied by son.  Last visit was 2 weeks ago.  At that time he was complaining of worsening balance, more somnolence and mood swing.  We checked Dilantin  level this was 25.1. We decreased his Dilantin  to 75 mg in the morning and 200 mg in the evening and since then he has been doing better, less sleepy and his balance is better. No seizures    Interval history 08/20/2023:  Patient presents today for follow-up, last visit was in September, he is accompanied by his son and wife.  They tell me since last visit he has not had any seizure or seizure-like activity.  He is compliant with his Dilantin , Keppra  and Vimpat .  Family has reported that he is sleeping more, his balance is worse, he is noted to have mood swing, he is crying very easily, more emotional.  He reports some decrease in his physical activity.  Wife tells me that on a couple occasion, he had these event where he will have a jerk like motion, full body, hold onto his walker and unable to walk; he will be helped to his bed.  He needs more help than previously with bathing and dressing himself.  Cognitive exam is  intact but physically he is requiring more help than previously, he has to rely more on his assistive device. He had 3 falls in the month of February.   Interval History 03/06/2023:  Cameron Potts presents today for follow-up, he is accompanied by his son.  Last visit was in March since then she has not had any seizure or seizure activity.  He is compliant with her antiseizure medications including phenytoin , Keppra  and Vimpat .  He continues to swipe his VNS at least twice a day, he reports lately he has trouble on taking his Keppra , feel like he is choking, nurses at the facility are helping him with taking his medication with custard.  No other complaints.   Interval history 09/03/2022:  Cameron Potts presents today for follow-up, he is accompanied by grandson.  Last visit was in November.  Since then he has not had any seizure or seizure-like activity.  He continues to do well.  He swipes VNS twice a day at least, no adverse reaction.  He is compliant with his medication no side effects   Interval History 05/13/2022: Patient presents today for follow-up, he is accompanied by wife.  Since last visit in August, he had 1 breakthrough seizure in September.  At that time, we had tried him on clobazam  but he could not tolerate due to side effects of  somnolence and confusion.  Therefore clobazam  has been discontinued.  He is currently on Vimpat , Dilantin , and Keppra .  He could not tolerate higher dose of VNS change due to cough and hoarseness.  He has not had any additional seizures.  He reports that he is back to his baseline.   Interval History 02/11/2022:  Cameron Potts presents today for follow-up, he is accompanied by his wife. Since last visit in March, he has been doing well, denies any seizures, denies any side effect from the medications.  He still have balance issue and had fallen in the month of July.  He reported he bumped his head very slightly.  3 days later he started getting worse, having confusion  and was taken to the ED. His head CT did not show any bleeding but his sodium was noted to be 122.  He does have chronic hyponatremia, he was seen by nephrology in the hospital.  He was told that hyponatremia was secondary to cerebral salt wasting.  He was treated and on discharge his sodium level was back to normal at 136.  In the hospital his antiseizure medications level were checked also and there were all within normal limits.  Since discharge from the hospital, he continues to do well.  He reported he is back to his normal self now.     Interval history 09/13/2021:  Patient presents today for follow-up, she is accompanied by his wife.  Last visit with Dr. Jenel was in September, at that time plan was to continue current medications.  He is known to have breakthrough seizure, and wife report that the last breakthrough seizure was 6 weeks ago.  He does have Ativan  currently at the facility and usually gets it as soon as the seizure started because he is known to go into status per wife.  Ativan  use to stop the seizures   History of present illness: Cameron Potts is an 88 year old right-handed white male with a history of intractable seizures.  He currently is on Dilantin , Vimpat , and Keppra  and he continues to have breakthrough seizures.  He last had a brief event that occurred 2 weeks ago.  He had a fall associated with this.  He had a another seizure about a month prior to this.  He has Ativan  that he receives as needed at his extended care facility.  His most recent Dilantin  level on 12 March 2021 was 18.0.  The patient is also on Coumadin .  He walks with a walker.  He is getting physical therapy on a regular basis.  He has had a general decline in his balance according to his wife and according to the patient.  He has a vagal nerve stimulator in place, this was inserted in 2013.  He tolerates this fairly well.   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 He has never used smokeless tobacco. He reports that he does not currently use alcohol. He reports that he does not use drugs.    Allergies  Allergen Reactions   Depakote [Divalproex Sodium] Other (See Comments)    Arthralgias     Medications:  Current Meds  Medication Sig   acetaminophen  (TYLENOL ) 325 MG tablet Take 650 mg by mouth every 6 (six) hours as needed for mild pain (pain score 1-3) or moderate pain (pain score 4-6).   aspirin  EC 81 MG tablet Take 1 tablet (81 mg total) by mouth daily. Swallow whole.   brimonidine -timolol  (COMBIGAN ) 0.2-0.5 % ophthalmic solution Place 1 drop into both eyes 2 (two) times daily.   calcium  carbonate (TUMS SMOOTHIES) 750 MG chewable tablet Chew 2 tablets by mouth 4 (four) times daily as needed for heartburn.   camphor-menthol (SARNA) lotion Apply 1 Application topically every 8 (eight) hours as needed for itching.   cetirizine  (ZYRTEC ) 10 MG tablet Take 10 mg by mouth as needed for allergies.    cholecalciferol (VITAMIN D ) 400 units TABS tablet Take 400 Units by mouth 2 (two) times daily.   clobetasol  cream (TEMOVATE ) 0.05 % Apply 1 application  topically 2 (two) times daily as needed (rash).   econazole nitrate 1 % cream Apply 1 Application topically 2 (two) times daily as needed.   finasteride  (PROSCAR ) 5 MG tablet Take 5 mg by mouth every morning.   flecainide  (TAMBOCOR ) 50 MG tablet Take 50 mg by mouth 2 (two) times daily.   lacosamide  (VIMPAT ) 200 MG TABS tablet Take 1 tablet (200 mg total) by mouth 2 (two) times daily.   latanoprost  (XALATAN ) 0.005 % ophthalmic solution Place 1 drop into both eyes at bedtime.   Levetiracetam  750 MG TB24 Take 2,250 mg by mouth every evening.   LORazepam  (ATIVAN ) 2 MG/ML concentrated solution Take 2 mg by mouth every 12 (twelve) hours as needed for anxiety.   phenytoin  (DILANTIN ) 100 MG ER capsule Take 200 mg by mouth daily. 2 Capsules in the evening   phenytoin  (DILANTIN ) 100 MG ER capsule Take 100 mg by mouth in the morning.   polyethylene glycol (MIRALAX  / GLYCOLAX ) 17 g packet Take 17 g by mouth daily as needed.   sodium chloride  1 g tablet Take 2 tablets (2 g total) by mouth 3 (three) times daily.   talc powder Apply 1 Application topically 2 (two) times daily.   tamsulosin  (FLOMAX ) 0.4 MG CAPS capsule Take 0.4 mg by mouth in the morning.   triamcinolone cream (KENALOG) 0.1 % Apply 1 Application topically 2 (two) times daily as needed (Rash).      ROS: Out of a complete 14 system review of symptoms, the patient complains only of the following symptoms, and all other reviewed systems are negative.  Walking difficulty Seizures  Blood pressure (!) 173/79, pulse 82, resp. rate 17, height 5' 8 (1.727 m), weight 178 lb (80.7  kg).   Physical Exam  General: The patient is alert and cooperative at the time of the examination.  Skin: No significant peripheral edema is noted.   Neurologic Exam  Mental status: The patient is alert and  oriented x 3 at the time of the examination. The patient has apparent normal recent and remote memory, with an apparently normal attention span and concentration ability.  Cranial nerves: Facial symmetry is present. Speech is normal, no aphasia or dysarthria is noted. Extraocular movements are full. Visual fields are full. Decrease facial movements.   Motor: The patient has good strength in all 4 extremities.  Sensory examination: Soft touch sensation is symmetric on the face, arms, and legs.  Coordination: The patient has good finger-nose-finger and heel-to-shin bilaterally.  Gait and station: The patient has a somewhat wide-based gait, the patient walks with a walker.  Tandem gait was not attempted.  Romberg is positive, he tends to fall backwards.   Assessment/Plan:  1.  Intractable seizures s/p VNS placement  2.  Gait disorder  He is stable on VNS and 3 antiepileptic medications. Doing well since discharge from the hospital, we will check a level again and will continue him on Dilantin  to 100 mg in the morning and 200 mg in the evening. My goal is a Dilantin  level between [10-20].  Continue with your other medications including Vimpat  200 mg BID, Keppra  XR 2250 mg daily.  I will call him to update him, otherwise follow up in 6 months or sooner if worse.    Patient Instructions  Please continue with Dilantin  100 mg in the morning and 200 mg in the morning  We will keep Dilantin  level between 10 to 20  Continue with Lacosamide  200 mg twice daily  Continue with Keppra  XR 2250 mg daily  Please check Dilantin  level monthly.  If level below 10, please give additional 100 mg Dilantin  in the morning every other day an recheck Dilantin  level in a week If level above 20, please decrease morning Dilantin  to 50 mg every other morning for a week then recheck Dilantin  level in a week  Follow up in 6 months or sooner if worse   I have spent a total of 45 minutes dedicated to this patient  today, preparing to see patient, performing a medically appropriate examination and evaluation, ordering tests and/or medications and procedures, and counseling and educating the patient/family/caregiver; independently interpreting result and communicating results to the family/patient/caregiver; and documenting clinical information in the electronic medical record.   Pastor Falling, MD 03/22/2024 4:53 PM  [facility] [address] [place], [place] [address]  Phone [phone] Fax [phone]

## 2024-03-23 ENCOUNTER — Ambulatory Visit: Payer: Self-pay | Admitting: Neurology

## 2024-03-23 LAB — BASIC METABOLIC PANEL WITH GFR
BUN/Creatinine Ratio: 16 (ref 10–24)
BUN: 12 mg/dL (ref 8–27)
CO2: 23 mmol/L (ref 20–29)
Calcium: 8.9 mg/dL (ref 8.6–10.2)
Chloride: 101 mmol/L (ref 96–106)
Creatinine, Ser: 0.75 mg/dL — ABNORMAL LOW (ref 0.76–1.27)
Glucose: 98 mg/dL (ref 70–99)
Potassium: 4.5 mmol/L (ref 3.5–5.2)
Sodium: 136 mmol/L (ref 134–144)
eGFR: 88 mL/min/1.73 (ref >59)

## 2024-03-23 LAB — PHENYTOIN LEVEL, TOTAL: Phenytoin (Dilantin), Serum: 10.7 ug/mL (ref 10.0–20.0)

## 2024-04-01 ENCOUNTER — Encounter

## 2024-04-01 LAB — CUP PACEART REMOTE DEVICE CHECK
Battery Remaining Longevity: 2 mo
Battery Voltage: 2.81 V
Brady Statistic AP VP Percent: 0.5 %
Brady Statistic AP VS Percent: 96.69 %
Brady Statistic AS VP Percent: 0.54 %
Brady Statistic AS VS Percent: 2.27 %
Brady Statistic RA Percent Paced: 96.77 %
Brady Statistic RV Percent Paced: 1.02 %
Date Time Interrogation Session: 20251016052606
HighPow Impedance: 72 Ohm
Lead Channel Impedance Value: 399 Ohm
Lead Channel Impedance Value: 418 Ohm
Lead Channel Impedance Value: 513 Ohm
Lead Channel Pacing Threshold Amplitude: 0.75 V
Lead Channel Pacing Threshold Amplitude: 1.625 V
Lead Channel Pacing Threshold Pulse Width: 0.4 ms
Lead Channel Pacing Threshold Pulse Width: 0.4 ms
Lead Channel Sensing Intrinsic Amplitude: 1.75 mV
Lead Channel Sensing Intrinsic Amplitude: 1.75 mV
Lead Channel Sensing Intrinsic Amplitude: 6.375 mV
Lead Channel Sensing Intrinsic Amplitude: 6.375 mV
Lead Channel Setting Pacing Amplitude: 1.5 V
Lead Channel Setting Pacing Amplitude: 3.25 V
Lead Channel Setting Pacing Pulse Width: 0.4 ms
Lead Channel Setting Sensing Sensitivity: 0.3 mV
Zone Setting Status: 755011

## 2024-04-07 ENCOUNTER — Ambulatory Visit: Payer: Self-pay | Admitting: Cardiovascular Disease

## 2024-04-14 NOTE — Progress Notes (Signed)
 Remote ICD transmission.

## 2024-04-22 DIAGNOSIS — E871 Hypo-osmolality and hyponatremia: Secondary | ICD-10-CM | POA: Diagnosis not present

## 2024-04-22 DIAGNOSIS — Z95810 Presence of automatic (implantable) cardiac defibrillator: Secondary | ICD-10-CM | POA: Diagnosis not present

## 2024-04-22 LAB — COMPREHENSIVE METABOLIC PANEL WITH GFR
Calcium: 9 (ref 8.7–10.7)
eGFR: 68

## 2024-04-22 LAB — CBC: RBC: 3.85 — AB (ref 3.87–5.11)

## 2024-04-22 LAB — BASIC METABOLIC PANEL WITH GFR
BUN: 15 (ref 4–21)
Chloride: 103 (ref 99–108)
Creatinine: 0.8 (ref 0.6–1.3)
Glucose: 91
Potassium: 4.3 meq/L (ref 3.5–5.1)
Sodium: 138 (ref 137–147)

## 2024-04-22 LAB — CBC AND DIFFERENTIAL
HCT: 37 — AB (ref 41–53)
Hemoglobin: 12.7 — AB (ref 13.5–17.5)
Platelets: 221 K/uL (ref 150–400)
WBC: 5.4

## 2024-05-02 ENCOUNTER — Ambulatory Visit: Attending: Cardiovascular Disease

## 2024-05-03 ENCOUNTER — Encounter: Payer: Self-pay | Admitting: Adult Health

## 2024-05-03 ENCOUNTER — Encounter

## 2024-05-03 ENCOUNTER — Non-Acute Institutional Stay: Payer: Self-pay | Admitting: Adult Health

## 2024-05-03 VITALS — BP 134/88 | HR 61 | Temp 97.2°F | Ht 68.0 in | Wt 174.6 lb

## 2024-05-03 DIAGNOSIS — L111 Transient acantholytic dermatosis [Grover]: Secondary | ICD-10-CM

## 2024-05-03 DIAGNOSIS — R269 Unspecified abnormalities of gait and mobility: Secondary | ICD-10-CM

## 2024-05-03 DIAGNOSIS — G40919 Epilepsy, unspecified, intractable, without status epilepticus: Secondary | ICD-10-CM

## 2024-05-03 DIAGNOSIS — Z95810 Presence of automatic (implantable) cardiac defibrillator: Secondary | ICD-10-CM | POA: Diagnosis not present

## 2024-05-03 DIAGNOSIS — Z8673 Personal history of transient ischemic attack (TIA), and cerebral infarction without residual deficits: Secondary | ICD-10-CM | POA: Diagnosis not present

## 2024-05-03 DIAGNOSIS — G20C Parkinsonism, unspecified: Secondary | ICD-10-CM | POA: Diagnosis not present

## 2024-05-03 DIAGNOSIS — I951 Orthostatic hypotension: Secondary | ICD-10-CM

## 2024-05-03 DIAGNOSIS — I482 Chronic atrial fibrillation, unspecified: Secondary | ICD-10-CM

## 2024-05-03 DIAGNOSIS — E871 Hypo-osmolality and hyponatremia: Secondary | ICD-10-CM | POA: Diagnosis not present

## 2024-05-03 LAB — CUP PACEART REMOTE DEVICE CHECK
Battery Remaining Longevity: 2 mo
Battery Voltage: 2.8 V
Brady Statistic AP VP Percent: 0.2 %
Brady Statistic AP VS Percent: 97.14 %
Brady Statistic AS VP Percent: 0.95 %
Brady Statistic AS VS Percent: 1.7 %
Brady Statistic RA Percent Paced: 97.21 %
Brady Statistic RV Percent Paced: 1.13 %
Date Time Interrogation Session: 20251116033323
HighPow Impedance: 73 Ohm
Lead Channel Impedance Value: 361 Ohm
Lead Channel Impedance Value: 399 Ohm
Lead Channel Impedance Value: 475 Ohm
Lead Channel Pacing Threshold Amplitude: 0.75 V
Lead Channel Pacing Threshold Amplitude: 2 V
Lead Channel Pacing Threshold Pulse Width: 0.4 ms
Lead Channel Pacing Threshold Pulse Width: 0.4 ms
Lead Channel Sensing Intrinsic Amplitude: 1.375 mV
Lead Channel Sensing Intrinsic Amplitude: 1.375 mV
Lead Channel Sensing Intrinsic Amplitude: 6.75 mV
Lead Channel Sensing Intrinsic Amplitude: 6.75 mV
Lead Channel Setting Pacing Amplitude: 1.5 V
Lead Channel Setting Pacing Amplitude: 4 V
Lead Channel Setting Pacing Pulse Width: 0.4 ms
Lead Channel Setting Sensing Sensitivity: 0.3 mV
Zone Setting Status: 755011

## 2024-05-03 NOTE — Progress Notes (Signed)
 Location:  [facility] POS: Clinic  Provider: Tawni America, ANP  Code Status: DNR and most form  Goals of Care:     02/17/2024    4:32 PM  Advanced Directives  Does Patient Have a Medical Advance Directive? Yes  Type of Advance Directive Living will;Out of facility DNR (pink MOST or yellow form);Healthcare Power of Attorney  Does patient want to make changes to medical advance directive? No - Patient declined  Copy of Healthcare Power of Attorney in Chart? Yes - validated most recent copy scanned in chart (See row information)  Pre-existing out of facility DNR order (yellow form or pink MOST form) Pink Most/Yellow Form available - Physician notified to receive inpatient order     Chief Complaint  Patient presents with   Follow-up    4 month follow up    HPI: Discussed the use of AI scribe software for clinical note transcription with the patient, who gave verbal consent to proceed.  History of Present Illness Cameron Potts is an 88 year old male here for routine followup  Generalized weakness and decreased strength - Generalized weakness and decreased strength over the past few days - Decreased strength particularly noticeable during seated exercise classes - No recent changes in medication regimen -normal labs done 11/6  Gait instability and falls - Feels 'not necessarily unsteady, but not real steady' - History of falls, attributed to medication levels affecting balance  Seizure disorder and anticonvulsant management - History of seizures - Phenytoin  level was low at 10.7 in October, increased to 11.9 subsequently -has a vagal nerve stimulator -followed by neurology   Cerebrovascular disease and secondary prevention - Hospitalized in August for acute right PCA ischemic stroke - Takes baby aspirin  for stroke prevention -states that they were not certain it was a stroke as he has prior left sided weakness from a prolong seizure foci  Electrolyte  management and gastrointestinal symptoms - Takes sodium tablets, 2 grams three times a day - Experiences nausea if sodium tablets are taken together, so doses are spread out - Recent sodium level was 138  Vaccination status and pulmonary hygiene - Uses incentive spirometer regularly - Up to date on influenza and COVID vaccinations - Last pneumonia vaccine in 2015, recommend prevnar 20  Afib Off coumadin  due to falls  Due for pacemaker change in next month, downgrade from ICD  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   BPH no issues.     Past Medical History:  Diagnosis Date   Abnormality of gait 12/21/2013   Atrial fibrillation (HCC)    Blind left eye    Carcinoma in situ of prostate    Cardiac arrest (HCC)    Glaucoma    Hypercholesteremia    Hyperlipidemia    Metabolic encephalopathy    Mild left ventricular systolic dysfunction    Orthostatic hypotension    Presence of other specified functional implants    PSA elevation    Right frontal lobe lesion    Seizures (HCC)    Tinea pedis    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grover)    Urinary urgency     Past Surgical History:  Procedure Laterality Date   CARDIAC DEFIBRILLATOR PLACEMENT     cataract surgery     COLONOSCOPY     CRANIOTOMY Right    right anterior temporal resection   defibrillator replaced  08/16/2014   IMPLANTATION VAGAL NERVE STIMULATOR  11/16/2014   NASAL SINUS SURGERY  TONSILLECTOMY     age 45   VASECTOMY  06/18/1983    Allergies  Allergen Reactions   Depakote [Divalproex Sodium] Other (See Comments)    Arthralgias     Outpatient Encounter Medications as of 05/03/2024  Medication Sig   acetaminophen  (TYLENOL ) 325 MG tablet Take 650 mg by mouth every 6 (six) hours as needed for mild pain (pain score 1-3) or moderate pain (pain score 4-6).   aspirin  EC 81 MG tablet Take 1 tablet (81 mg total) by mouth daily. Swallow  whole.   brimonidine -timolol  (COMBIGAN ) 0.2-0.5 % ophthalmic solution Place 1 drop into both eyes 2 (two) times daily.   calcium  carbonate (TUMS SMOOTHIES) 750 MG chewable tablet Chew 2 tablets by mouth 4 (four) times daily as needed for heartburn.   camphor-menthol (SARNA) lotion Apply 1 Application topically every 8 (eight) hours as needed for itching.   cetirizine  (ZYRTEC ) 10 MG tablet Take 10 mg by mouth as needed for allergies.   cholecalciferol (VITAMIN D ) 400 units TABS tablet Take 400 Units by mouth 2 (two) times daily.   clobetasol  cream (TEMOVATE ) 0.05 % Apply 1 application  topically 2 (two) times daily as needed (rash).   econazole nitrate 1 % cream Apply 1 Application topically 2 (two) times daily as needed.   finasteride  (PROSCAR ) 5 MG tablet Take 5 mg by mouth every morning.   flecainide  (TAMBOCOR ) 50 MG tablet Take 50 mg by mouth 2 (two) times daily.   lacosamide  (VIMPAT ) 200 MG TABS tablet Take 1 tablet (200 mg total) by mouth 2 (two) times daily.   latanoprost  (XALATAN ) 0.005 % ophthalmic solution Place 1 drop into both eyes at bedtime.   Levetiracetam  750 MG TB24 Take 2,250 mg by mouth every evening.   LORazepam  (ATIVAN ) 2 MG/ML concentrated solution Take 2 mg by mouth every 12 (twelve) hours as needed for anxiety.   phenytoin  (DILANTIN ) 100 MG ER capsule Take 200 mg by mouth daily. 2 Capsules in the evening   phenytoin  (DILANTIN ) 100 MG ER capsule Take 100 mg by mouth in the morning.   polyethylene glycol (MIRALAX  / GLYCOLAX ) 17 g packet Take 17 g by mouth daily as needed.   sodium chloride  1 g tablet Take 2 tablets (2 g total) by mouth 3 (three) times daily.   talc powder Apply 1 Application topically 2 (two) times daily.   tamsulosin  (FLOMAX ) 0.4 MG CAPS capsule Take 0.4 mg by mouth in the morning.   triamcinolone cream (KENALOG) 0.1 % Apply 1 Application topically 2 (two) times daily as needed (Rash).   No facility-administered encounter medications on file as of  05/03/2024.    Review of Systems:  Review of Systems  Constitutional:  Positive for fatigue. Negative for activity change, appetite change, chills, diaphoresis and fever.  HENT:  Negative for congestion.   Respiratory:  Negative for cough, shortness of breath and wheezing.   Cardiovascular:  Negative for chest pain and leg swelling.  Gastrointestinal:  Negative for abdominal distention, abdominal pain, constipation, diarrhea, nausea and vomiting.  Genitourinary:  Negative for difficulty urinating, dysuria and urgency.  Musculoskeletal:  Positive for gait problem. Negative for back pain, myalgias and neck pain.  Skin:  Positive for rash.  Neurological:  Positive for weakness. Negative for dizziness.  Psychiatric/Behavioral:  Negative for confusion.     Health Maintenance  Topic Date Due   Medicare Annual Wellness ([name])  06/14/2020   Zoster Vaccines- Shingrix (2 of 2) 12/28/2024 (Originally 01/29/2017)   COVID-19 Vaccine (7 -  Moderna risk 2025-26 season) 09/13/2024   DTaP/Tdap/Td (4 - Td or Tdap) 09/18/2030   Pneumococcal Vaccine: 50+ Years  Completed   Influenza Vaccine  Completed   Meningococcal B Vaccine  Aged Out    Physical Exam: Vitals:   05/03/24 0828  BP: 134/88  Pulse: 61  Temp: (!) 97.2 F (36.2 C)  SpO2: 96%  Weight: 174 lb 9.6 oz (79.2 kg)  Height: 5' 8 (1.727 m)   Body mass index is 26.55 kg/m. Physical Exam Vitals and nursing note reviewed.  Constitutional:      Appearance: Normal appearance.  HENT:     Head: Normocephalic and atraumatic.     Right Ear: Tympanic membrane normal.     Left Ear: Tympanic membrane normal.     Nose: Nose normal.     Mouth/Throat:     Mouth: Mucous membranes are moist.     Pharynx: Oropharynx is clear.  Eyes:     Conjunctiva/sclera: Conjunctivae normal.     Pupils: Pupils are equal, round, and reactive to light.  Cardiovascular:     Rate and Rhythm: Normal rate and regular rhythm.     Heart sounds: No murmur  heard. Pulmonary:     Effort: Pulmonary effort is normal. No respiratory distress.     Breath sounds: Normal breath sounds. No wheezing.  Abdominal:     General: Bowel sounds are normal. There is no distension.     Palpations: Abdomen is soft.     Tenderness: There is no abdominal tenderness.  Musculoskeletal:     Cervical back: Normal range of motion. No rigidity.     Right lower leg: No edema.     Left lower leg: No edema.     Comments: Trace ankle edema   Lymphadenopathy:     Cervical: No cervical adenopathy.  Skin:    General: Skin is warm and dry.  Neurological:     Mental Status: He is alert and oriented to person, place, and time. Mental status is at baseline.     Comments: Left sided weakness LUE 4/5 LLE 4/5 RUE 5/5 RLE 5/5  Psychiatric:        Mood and Affect: Mood normal.     Labs reviewed: Basic Metabolic Panel: Recent Labs    10/17/23 0000 02/14/24 1006 02/14/24 1012 02/14/24 1026 02/15/24 0513 02/23/24 0000 03/22/24 1648 04/22/24 0000  NA 138   < > 136  --  135 135* 136 138  K 4.6   < > 4.3  --  4.2 4.2 4.5 4.3  CL 103   < > 103  --  103 103 101 103  CO2 25*   < >  --   --  22 20 23   --   GLUCOSE  --    < > 97  --  90  --  98  --   BUN 12   < > 14  --  10 17 12 15   CREATININE 0.7   < > 0.70  --  0.65 0.7 0.75* 0.8  CALCIUM  8.9   < >  --   --  8.6* 9.0 8.9 9.0  MG  --   --   --  2.0 1.8  --   --   --   PHOS  --   --   --   --  3.9  --   --   --   TSH 4.44  --   --   --   --   --   --   --    < > =  values in this interval not displayed.   Liver Function Tests: Recent Labs    02/14/24 1006 02/15/24 0513  AST 23 21  ALT 18 16  ALKPHOS 126 116  BILITOT 0.6 0.7  PROT 7.5 6.4*  ALBUMIN 4.0 3.4*   No results for input(s): LIPASE, AMYLASE in the last 8760 hours. No results for input(s): AMMONIA in the last 8760 hours. CBC: Recent Labs    08/05/23 1457 02/14/24 1006 02/14/24 1012 02/15/24 0513 02/23/24 0000 04/22/24 0000  WBC 6.4  6.1  --  5.4 5.0 5.4  NEUTROABS  --  3.2  --   --   --   --   HGB 11.6* 12.9*   < > 12.0* 12.2* 12.7*  HCT 33.9* 36.7*   < > 33.5* 35* 37*  MCV 94.2 93.4  --  91.5  --   --   PLT 209 209  --  186 204 221   < > = values in this interval not displayed.   Lipid Panel: Recent Labs    10/17/23 0000 02/15/24 0513  CHOL 214* 184  HDL 66 59  LDLCALC 137 113*  TRIG 54 59  CHOLHDL  --  3.1   Lab Results  Component Value Date   HGBA1C 5.1 02/14/2024    Procedures since last visit: No results found.  Assessment/Plan Assessment and Plan Assessment & Plan Adult Wellness Visit Routine wellness visit with vaccinations up to date. Discussed pneumonia vaccine due to age and health history. - Administered Prevnar 20 vaccine.  Seizure disorder Phenytoin  level increased to 11.9. No recent seizures. Neurologist managing medication. - Continue current management with neurologist. -chronic left sided weakness from prior seizure  Hyponatremia managed with sodium supplementation Sodium level well-managed at 138. Discussed potential adjustment if sodium intake becomes intolerable. - Continue current sodium supplementation regimen. - Monitor sodium levels every 2-3 months.  Orthostatic hypotension No acute changes reported.  Planned pacemaker replacement Scheduled for next month. He is accustomed to medical procedures and is not concerned about the upcoming surgery.  History of right posterior cerebral artery ischemic stroke Previous CT scan indicated possible stroke, but findings were inconclusive. Currently on aspirin  for prevention. - Continue aspirin  for stroke prevention.  Grovers Maculopapular rash on the back, dry but not severe. Managed with Cerave and triamcinolone. - Continue current topical treatment with Cerave and triamcinolone.   Muscle weakness and decreased exercise tolerance Reports decreased strength and exercise tolerance. No acute changes in medication or health  status to explain symptoms. Possible age-related decline. - Encouraged continued physical activity as tolerated. -abnormal gait use walker at all tmes.   Afib Rate controlled Followed by cards Off anticoagulation    Labs/tests ordered:  * No order type specified * BMP q 2 months  Next appt:  4 month f/u   Total time :  time greater than 50% of total time spent doing pt counseling and coordination of care

## 2024-05-04 ENCOUNTER — Encounter: Payer: Self-pay | Admitting: Adult Health

## 2024-05-04 DIAGNOSIS — Z23 Encounter for immunization: Secondary | ICD-10-CM | POA: Diagnosis not present

## 2024-05-10 ENCOUNTER — Ambulatory Visit: Payer: Self-pay | Admitting: Cardiovascular Disease

## 2024-05-11 ENCOUNTER — Ambulatory Visit: Attending: Cardiovascular Disease | Admitting: Cardiovascular Disease

## 2024-05-11 ENCOUNTER — Encounter: Payer: Self-pay | Admitting: Emergency Medicine

## 2024-05-11 DIAGNOSIS — Z4502 Encounter for adjustment and management of automatic implantable cardiac defibrillator: Secondary | ICD-10-CM

## 2024-05-11 DIAGNOSIS — I495 Sick sinus syndrome: Secondary | ICD-10-CM

## 2024-05-11 NOTE — Patient Instructions (Signed)
 Implantable Device Instructions     Cameron Potts                      03/18/2024   You are scheduled for a ICD generator change to a Permanent transvenous pacemaker (PPM)  on Thursday, December 4 with Dr. Jerel Croitoru.   1. Pre procedure Lab testing: CBC,BMP   after Nov 5 , 2025    2. Please arrive at the Main Entrance A at [facility]: [address] [place], [place] [address] on December 4 at 11:30 AM (This time is two hours before your procedure to ensure your preparation). Free valet parking service is available. You will check in at ADMITTING. The support person will be asked to wait in the waiting room.  It is OK to have someone drop you off and come back when you are ready to be discharged.         Special note: Every effort is made to have your procedure done on time. Please understand that emergencies sometimes delay  scheduled procedures.   3.  No eating or drinking after midnight prior to procedure.     4.  Medication instructions:  On the morning of your procedure take only Flecainide *and seizure medications.   5.  The night before your procedure and the morning of your procedure scrub your neck/chest with CHG surgical scrub.  See instruction letter.   6. Plan to go home the same day, you will only stay overnight if medically necessary. 7.  You MUST have a responsible adult to drive you home. 8.   An adult MUST be with you the first 24 hours after you arrive home. 9..  Bring a current list of your medications, and the last time and date medication taken. 10. Bring ID and current insurance cards. 11. .Please wear clothes that are easy to get on and off and wear slip-on shoes.    You will follow up with the [facility] Device clinic 10-14 days after your procedure.  You will follow up with Dr. Jerel Croitoru 91 days after your procedure.  These appointments will be made for you.   * If you have ANY questions after you get home, please call the office at  [phone] or send a MyChart message.   FYI: For your safety, and to allow us  to monitor your vital signs accurately during the surgery/procedure we request that if you have artificial nails, gel coating, SNS etc. Please have those removed prior to your surgery/procedure. Not having the nail coverings /polish removed may result in cancellation or delay of your surgery/procedure.    [place] - Preparing For Surgery     Before surgery, you can play an important role. Because skin is not sterile, your skin needs to be as free of germs as possible. You can reduce the number of germs on your skin by washing with CHG (chlorahexidine gluconate) Soap before surgery.  CHG is an antiseptic cleaner which kills germs and bonds with the skin to continue killing germs even after washing.   Please do not use if you have an allergy to CHG or antibacterial soaps.  If your skin becomes reddened/irritated stop using the CHG.   Do not shave (including legs and underarms) for at least 48 hours prior to first CHG shower.  It is OK to shave your face.   Please follow these instructions carefully:  1.  Shower the night before surgery and the morning of surgery with CHG.             2.  If you choose to wash your hair, wash your hair first as usual with your normal shampoo.             3.  After you shampoo, rinse your hair and body thoroughly to remove the shampoo.             4.  Use CHG as you would any other liquid soap.  You can apply CHG directly to the skin and wash gently with a clean washcloth. 5.  Apply the CHG Soap to your body ONLY FROM THE NECK DOWN.  Do not use on open wounds or open sores.  Avoid contact with your eyes, ears, mouth and genitals (private parts).  Wash genitals (private parts) with your normal soap.             6.  Wash thoroughly, paying special attention to the area where your surgery will be performed.             7.  Thoroughly rinse your body with warm water from the  neck down.              8.  DO NOT shower/wash with your normal soap after using and rinsing off the CHG soap.             9.  Pat yourself dry with a clean towel.           10.  Wear clean pajamas.           11.  Place clean sheets on your bed the night of your first shower and do not sleep with pets.   Day of Surgery: Do not apply any deodorants/lotions.  Please wear clean clothes to the hospital/surgery center.     Signed, Jerel Balding, MD  03/18/2024 5:57 PM    [name]

## 2024-05-11 NOTE — Progress Notes (Signed)
 Cardiology Office Note:    Date:  05/11/2024   ID:  Cameron Potts, DOB 10/21/1935, MRN [id number]  PCP:  Charlanne Fredia CROME, MD   [facility] Providers Cardiologist:  Jerel Balding, MD     Referring MD: Charlanne Fredia CROME, MD   Chief Complaint  Patient presents with   Paroxysmal atrial tachycardia   Follow-up    History of Present Illness:    Cameron Potts is a 88 y.o. male with a hx of paroxysmal atrial flutter and an episode of cardiac arrest (per limited documents available this apparently occurred not long after a right hippocampal mass resection complicated by seizures and cardiac arrest in 2008), which led to ICD implantation.  In 2015 he had an inappropriate defibrillator discharge for atrial flutter with rapid ventricular response.  He underwent ICD generator change out in 2016.  He has recurrent problems with hyponatremia, oral salt tablets.  This was a phone visit since he is physically unable to come to the office due to his neurological issues.  He is scheduled for a pacemaker generator change out next week.  At his previous office appointment, his wife was present and we discussed the fact that his ICD has reached elective replacement indicator.  We discussed different approaches and decided to downgrade to a pacemaker (during 18 years of ICD he has never received appropriate VT therapies and in fact has received inappropriate shocks for atrial flutter; he is now 88 years old and his multiple comorbid conditions make him focus more on quality of life rather than length of life; he has preserved left ventricular systolic function).  His hyponatremia is corrected, while taking sodium tablets.  He has not had any new focal neurological events since his last appointment, no new deficits since his stroke in August 2025.SABRA  He remains very sedentary and is very prone to falls due to poor balance.  He has not had recent seizures.  His current device is a  Scientist, Product/process Development.  He has 97% atrial pacing, with a blunted heart rate histogram that is appropriate for sedentary lifestyle.  He only has 1.5% ventricular pacing.  He is not pacemaker dependent.  He has had occasional episodes of atrial tachycardia/slow atrial flutter, typically with an atrial rate of 170 bpm and 2: 1 AV conduction, therefore never associated with high ventricular rates.  He has not had any ventricular tachycardia.  The overall burden of atrial mode switch is only 0.2%.  Interestingly, many of the spells have happened just in the last week.  Lead parameters are all normal.  His atrial lead is a Medtronic 5076-lead implanted in 2008.  His defibrillator RV lead is a Estate Manager/land Agent DF 1-lead which was implanted in 2008.  Mr. Fredericks lived in [place] for many years after which he retired to [employer]  and received care at the [facility] in [place].  For the last 5 years he has been living in [place], his wife's native town.  He is a resident at [facility].  He has continued receiving some of his medical care in the [facility] in [place] .  Despite numerous hospitalizations for epilepsy and hyponatremia since 2019, there have been no active cardiac issues.   Even though he has reportedly had atrial flutter in the past and was prescribed warfarin (direct oral anticoagulants not possible due to his seizure medicines) at a previous appointment we stopped treatment with warfarin since we have not seen any significant arrhythmia since  2015.  He continues to be prone to falls, but he has not had any serious bleeding events.  He has not had any focal neurological events consistent with stroke or TIA.  His device has now shown some regular and slow atrial flutter (more likely atrial tachycardia) at 170 bpm, which I do not think is associated with high thromboembolic risk.  He is on chronic treatment with flecainide  50 mg twice daily but is not receiving a beta-blocker (due to orthostatic  hypotension).  His device is a Medtronic Evera XT device (model number DD BB 1 D1), implanted as a generator change out in 2016.  The ventricular lead is a Retail Buyer lead implanted in 2008 (model not clear).  The atrial lead is a Medtronic 5076-52 cm lead also implanted in 2008.  Cannot comment on data from his original generator which she had from 2008 until 2016, but the current device has never delivered therapy for ventricular tachycardia or ventricular fibrillation since its implantation in 2016.  He has a history of drug-resistant focal epilepsy for which he underwent a right anterior temporal lobectomy in 2008.  He underwent implantation of a vagal nerve stimulator in the right subclavian area for this in 2016.  This led to some improvement, but he has had multiple breakthrough seizures since, including intractable seizures requiring numerous episodes of hospitalization.  He also carries a diagnosis of parkinsonism and associated orthostatic hypotension. Head CT shows sequelae of previous right temporal lobectomy as well as a unchanged small chronic infarct involving the right cingulate gyrus comparison to 2020.  In August 2025 he had a new small acute/subacute infarct in the PCA distribution, left inferior optic pole.  Past Medical History:  Diagnosis Date   Abnormality of gait 12/21/2013   Atrial fibrillation (HCC)    Blind left eye    Carcinoma in situ of prostate    Cardiac arrest (HCC)    Glaucoma    Hypercholesteremia    Hyperlipidemia    Metabolic encephalopathy    Mild left ventricular systolic dysfunction    Orthostatic hypotension    Presence of other specified functional implants    PSA elevation    Right frontal lobe lesion    Seizures (HCC)    Tinea pedis    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grover)    Urinary urgency     Past Surgical History:  Procedure Laterality Date   CARDIAC DEFIBRILLATOR PLACEMENT     cataract surgery     COLONOSCOPY     CRANIOTOMY Right     right anterior temporal resection   defibrillator replaced  08/16/2014   IMPLANTATION VAGAL NERVE STIMULATOR  11/16/2014   NASAL SINUS SURGERY     TONSILLECTOMY     age 89   VASECTOMY  06/18/1983    Current Medications: No outpatient medications have been marked as taking for the 05/11/24 encounter (Video Visit) with Draydon Clairmont, Jerel, MD.     Allergies:   Depakote [divalproex sodium]   Social History   Socioeconomic History   Marital status: Married    Spouse name: Slater   Number of children: 3   Years of education: MBA   Highest education level: Not on file  Occupational History   Occupation: Retired   Occupation: Field Seismologist  Tobacco Use   Smoking status: Never   Smokeless tobacco: Never   Tobacco comments:    quit 1960s  Vaping Use   Vaping status: Never Used  Substance and Sexual Activity   Alcohol use: Not  Currently    Comment: former beer drinker   Drug use: No   Sexual activity: Not Currently  Other Topics Concern   Not on file  Social History Narrative   Lives Assisted Living, [facility]   Right Handed   Drinks 2-3 cups caffeine daily   Social Drivers of Health   Financial Resource Strain: Low Risk  (04/15/2018)   Overall Financial Resource Strain (CARDIA)    Difficulty of Paying Living Expenses: Not hard at all  Food Insecurity: No Food Insecurity (02/14/2024)   Hunger Vital Sign    Worried About Running Out of Food in the Last Year: Never true    Ran Out of Food in the Last Year: Never true  Transportation Needs: No Transportation Needs (02/14/2024)   PRAPARE - Administrator, Civil Service (Medical): No    Lack of Transportation (Non-Medical): No  Physical Activity: Sufficiently Active (04/15/2018)   Exercise Vital Sign    Days of Exercise per Week: 5 days    Minutes of Exercise per Session: 30 min  Stress: No Stress Concern Present (04/15/2018)   [employer] of Occupational Health - Occupational Stress Questionnaire     Feeling of Stress : Not at all  Social Connections: Moderately Isolated (02/14/2024)   Social Connection and Isolation Panel    Frequency of Communication with Friends and Family: More than three times a week    Frequency of Social Gatherings with Friends and Family: More than three times a week    Attends Religious Services: Never    Database Administrator or Organizations: No    Attends Banker Meetings: Never    Marital Status: Married     Family History: The patient's family history includes Cancer - Lung in his mother; Cancer - Prostate in his father.  ROS:   Please see the history of present illness.     All other systems reviewed and are negative.  EKGs/Labs/Other Studies Reviewed:    The following studies were reviewed today: Review of multiple imaging studies from recent hospitalizations in [place] Review of hospitalization records and notes from [facility], [place]   EKG:    EKG Interpretation Date/Time:    Ventricular Rate:    PR Interval:    QRS Duration:    QT Interval:    QTC Calculation:   R Axis:      Text Interpretation:           Recent Labs: 10/17/2023: TSH 4.44 02/15/2024: ALT 16; Magnesium 1.8 04/22/2024: BUN 15; Creatinine 0.8; Hemoglobin 12.7; Platelets 221; Potassium 4.3; Sodium 138  Recent Lipid Panel    Component Value Date/Time   CHOL 184 02/15/2024 0513   TRIG 59 02/15/2024 0513   HDL 59 02/15/2024 0513   CHOLHDL 3.1 02/15/2024 0513   VLDL 12 02/15/2024 0513   LDLCALC 113 (H) 02/15/2024 0513     Risk Assessment/Calculations:      Physical Exam:    VS:  There were no vitals taken for this visit.    Wt Readings from Last 3 Encounters:  05/03/24 174 lb 9.6 oz (79.2 kg)  03/22/24 178 lb (80.7 kg)  03/18/24 172 lb (78 kg)      ASSESSMENT:    No diagnosis found.  PLAN:    In order of problems listed above:  History of atrial flutter/tachycardia: Very low burden of what is likely atrial  tachycardia.  He had an inappropriate defibrillator discharge for atrial flutter with 1: 1 AV  conduction in 2015, by report.  Since his current defibrillator generator was implanted in 2016, have not been able to see an actual recording.  I suspect that the current episodes of paroxysmal atrial tachycardia at 171 bpm, now conducted with 2: 1 AV block may be the same arrhythmia.  I think this has low thromboembolic risk since it probably is associated with coherent mechanical atrial contraction.  He has had almost none of these episodes in the last 10 years, until just a few days ago.  His recent stroke would be in an atypical distribution for atrial fibrillation and it occurred weeks before we detected the arrhythmia.  None has been detected since 2015 when he had an appropriate defibrillator discharge.  I remain of the opinion that the risk of disastrous intracranial bleeding associated with falls is a bigger risk to him than that of thromboembolism due to atrial arrhythmia. Acquired thrombophilia:   He cannot take direct oral anticoagulants due to his seizure medicines.  The risk of warfarin related bleeding exceeds the benefit.  We have seen a very low burden of arrhythmia on his pacemaker. SSS/AV conduction disease: For the most part he has atrial paced rhythm and should be considered atrially pacemaker dependent.  I suspect that the 3.2% of the time when he does not have atrial pacing he may be having premature atrial contractions.  He does have a junctional escape rhythm but this is quite slow, less than 30 bpm.  During atrial pacing he has normal 1: 1 AV conduction, albeit with a very long AV delay of about 400 ms.  Despite this he rarely requires ventricular pacing.  During atrial tachycardia at 130-170 bpm he only conducts 2: 1, thereby having spontaneously rate controlled arrhythmia. LBBB: Seems to be due to age-related conduction system disease rather than any structural abnormalities.  Essentially  had a normal echo in October 2023. Flecainide : This has not been associated with any episodes of prolonged arrhythmia to date.  Prescribed after he developed atrial flutter with 1:1 AV conduction, but unable to use AV nodal blocking agents due to severe orthostatic hypotension.  No known clinical history of CAD or PAD, but these are definitely possible at his age.  No evidence of significant structural heart disease by echo, at this point continuing the flecainide  appears to be a reasonable decision.   Coronary artery calcifications:  Severe atheromatous calcifications in the coronary arteries described on CT. he has never had angina pectoris, intermittent claudication or any other signs of advanced vascular disease.  Not currently on lipid-lowering therapy.  Considering his age and comorbid conditions, uncertain what the lipid-lowering therapy would make much of a difference at this point. HLP: Send LDL cholesterol was only slightly higher than desirable at 113.  He does not have diabetes mellitus and has never had clinically relevant vascular problems.  He takes a very long list of medications with many potential drug interactions, for his seizures.  I do not think lipid-lowering therapy is indicated. ICD battery depletion: Normal device function.  He has never received appropriate VT/VF intervention (did have inappropriate ICD shock for atrial flutter with high ventricular rate in 2015, on his old generator).  He needs pacemaker therapy for both sinus node dysfunction and for possible future worsening AV conduction disease.  In contradistinction, I do not think he requires ICD therapies: He has never received appropriate VT therapies, he has normal left ventricular function, he is now 88 years old.  I went over these issues in  quite a bit of detail with the patient and his wife.  I told him that I recommend downgrading the ICD generator to a dual-chamber pacemaker and they both agreed.  In addition I  recommended implantation of a Tyrex antibiotic pouch at the time of generator change out.  He is at increased risk of infection since this will be the third surgery in that area and device infection could have devastating consequences since he is atrially dependent. Hyponatremia: Seems to be well compensated as long as he takes all of the prescribed salt tablets.  May be related to SIADH or chronic treatment with levetiracetam , but his seizures have been very difficult to manage, so I do not think this medication can be stopped.  Does not appear to have adrenal insufficiency (baseline cortisol level 20.5, albeit with a paradoxical drop in response to ACTH  on labs performed 01/26/2022).  He has tolerated salt loading without any signs of heart failure clinically.  Occasionally his OptiVol has been briefly out of range, but it has been stable for the most part. Seizure disorder:  He is on 3 different antiepileptic medications and has a vagal nerve stimulator.  He has had breakthrough seizures despite this.  Has history of right temporal lobectomy. Orthostatic hypotension: Blood pressure is normal.  We should tolerate even mild hypertension up to as much as 160 mmHg since he has a tendency to drop his blood pressure.  Has had numerous falls, and the volatile blood pressure is probably related to his parkinsonism syndrome, and salt wasting/hyponatremia.  Avoid perfect control of his blood pressure.  And to avoid sudden changes in position.  He may need compression stockings or reminded abdominal binder if symptoms are not well-controlled.         This procedure (ICD generator change with downgrade to dual-chamber permanent pacemaker and use of a antibiotic Aigis pouch) has been fully reviewed with the patient , informed consent has been obtained.   Medication Adjustments/Labs and Tests Ordered: Current medicines are reviewed at length with the patient today.  Concerns regarding medicines are outlined  above.  No orders of the defined types were placed in this encounter.  No orders of the defined types were placed in this encounter.   Patient Instructions     Implantable Device Instructions     Avi Kerschner Potts                      03/18/2024   You are scheduled for a ICD generator change to a Permanent transvenous pacemaker (PPM)  on Thursday, December 4 with Dr. Jerel Caylei Sperry.   1. Pre procedure Lab testing: CBC,BMP   after Nov 5 , 2025    2. Please arrive at the Main Entrance A at [facility]: [address] [place], [place] [address] on December 4 at 11:30 AM (This time is two hours before your procedure to ensure your preparation). Free valet parking service is available. You will check in at ADMITTING. The support person will be asked to wait in the waiting room.  It is OK to have someone drop you off and come back when you are ready to be discharged.         Special note: Every effort is made to have your procedure done on time. Please understand that emergencies sometimes delay  scheduled procedures.   3.  No eating or drinking after midnight prior to procedure.     4.  Medication instructions:  On the morning of your procedure take only Flecainide *and seizure medications.   5.  The night before your procedure and the morning of your procedure scrub your neck/chest with CHG surgical scrub.  See instruction letter.   6. Plan to go home the same day, you will only stay overnight if medically necessary. 7.  You MUST have a responsible adult to drive you home. 8.   An adult MUST be with you the first 24 hours after you arrive home. 9..  Bring a current list of your medications, and the last time and date medication taken. 10. Bring ID and current insurance cards. 11. .Please wear clothes that are easy to get on and off and wear slip-on shoes.    You will follow up with the [facility] Device clinic 10-14 days after your procedure.  You will follow up with Dr.  Jerel Naome Brigandi 91 days after your procedure.  These appointments will be made for you.   * If you have ANY questions after you get home, please call the office at [phone] or send a MyChart message.   FYI: For your safety, and to allow us  to monitor your vital signs accurately during the surgery/procedure we request that if you have artificial nails, gel coating, SNS etc. Please have those removed prior to your surgery/procedure. Not having the nail coverings /polish removed may result in cancellation or delay of your surgery/procedure.    [facility] - Preparing For Surgery     Before surgery, you can play an important role. Because skin is not sterile, your skin needs to be as free of germs as possible. You can reduce the number of germs on your skin by washing with CHG (chlorahexidine gluconate) Soap before surgery.  CHG is an antiseptic cleaner which kills germs and bonds with the skin to continue killing germs even after washing.   Please do not use if you have an allergy to CHG or antibacterial soaps.  If your skin becomes reddened/irritated stop using the CHG.   Do not shave (including legs and underarms) for at least 48 hours prior to first CHG shower.  It is OK to shave your face.   Please follow these instructions carefully:             1.  Shower the night before surgery and the morning of surgery with CHG.             2.  If you choose to wash your hair, wash your hair first as usual with your normal shampoo.             3.  After you shampoo, rinse your hair and body thoroughly to remove the shampoo.             4.  Use CHG as you would any other liquid soap.  You can apply CHG directly to the skin and wash gently with a clean washcloth. 5.  Apply the CHG Soap to your body ONLY FROM THE NECK DOWN.  Do not use on open wounds or open sores.  Avoid contact with your eyes, ears, mouth and genitals (private parts).  Wash genitals (private parts) with your normal soap.             6.   Wash thoroughly, paying special attention to the area where your surgery will be performed.             7.  Thoroughly rinse your body with warm water from  the neck down.              8.  DO NOT shower/wash with your normal soap after using and rinsing off the CHG soap.             9.  Pat yourself dry with a clean towel.           10.  Wear clean pajamas.           11.  Place clean sheets on your bed the night of your first shower and do not sleep with pets.   Day of Surgery: Do not apply any deodorants/lotions.  Please wear clean clothes to the hospital/surgery center.     Signed, Jerel Balding, MD  03/18/2024 5:57 PM    [facility]   Signed, Jerel Balding, MD  05/11/2024 1:04 PM    [facility]

## 2024-05-11 NOTE — H&P (View-Only) (Signed)
 Cardiology Office Note:    Date:  05/11/2024   ID:  Jama Elex Candy Potts, DOB 02-Jun-1936, MRN [id number]  PCP:  Charlanne Fredia CROME, MD    [facility] Providers Cardiologist:  Jerel Balding, MD     Referring MD: Charlanne Fredia CROME, MD   Chief Complaint  Patient presents with   Paroxysmal atrial tachycardia   Follow-up    History of Present Illness:    Cameron Potts is a 88 y.o. male with a hx of paroxysmal atrial flutter and an episode of cardiac arrest (per limited documents available this apparently occurred not long after a right hippocampal mass resection complicated by seizures and cardiac arrest in 2008), which led to ICD implantation.  In 2015 he had an inappropriate defibrillator discharge for atrial flutter with rapid ventricular response.  He underwent ICD generator change out in 2016.  He has recurrent problems with hyponatremia, oral salt tablets.  This was a phone visit since he is physically unable to come to the office due to his neurological issues.  He is scheduled for a pacemaker generator change out next week.  At his previous office appointment, his wife was present and we discussed the fact that his ICD has reached elective replacement indicator.  We discussed different approaches and decided to downgrade to a pacemaker (during 18 years of ICD he has never received appropriate VT therapies and in fact has received inappropriate shocks for atrial flutter; he is now 88 years old and his multiple comorbid conditions make him focus more on quality of life rather than length of life; he has preserved left ventricular systolic function).  His hyponatremia is corrected, while taking sodium tablets.  He has not had any new focal neurological events since his last appointment, no new deficits since his stroke in August 2025.SABRA  He remains very sedentary and is very prone to falls due to poor balance.  He has not had recent seizures.  His current device is a  Scientist, Product/process Development.  He has 97% atrial pacing, with a blunted heart rate histogram that is appropriate for sedentary lifestyle.  He only has 1.5% ventricular pacing.  He is not pacemaker dependent.  He has had occasional episodes of atrial tachycardia/slow atrial flutter, typically with an atrial rate of 170 bpm and 2: 1 AV conduction, therefore never associated with high ventricular rates.  He has not had any ventricular tachycardia.  The overall burden of atrial mode switch is only 0.2%.  Interestingly, many of the spells have happened just in the last week.  Lead parameters are all normal.  His atrial lead is a Medtronic 5076-lead implanted in 2008.  His defibrillator RV lead is a Estate Manager/land Agent DF 1-lead which was implanted in 2008.  Mr. Boyadjian lived in [place] for many years after which he retired to [place]  and received care at the [facility] in [place].  For the last 5 years he has been living in [place], his wife's native town.  He is a resident at [facility].  He has continued receiving some of his medical care in the [facility] in [place] .  Despite numerous hospitalizations for epilepsy and hyponatremia since 2019, there have been no active cardiac issues.   Even though he has reportedly had atrial flutter in the past and was prescribed warfarin (direct oral anticoagulants not possible due to his seizure medicines) at a previous appointment we stopped treatment with warfarin since we have not seen any significant arrhythmia since  2015.  He continues to be prone to falls, but he has not had any serious bleeding events.  He has not had any focal neurological events consistent with stroke or TIA.  His device has now shown some regular and slow atrial flutter (more likely atrial tachycardia) at 170 bpm, which I do not think is associated with high thromboembolic risk.  He is on chronic treatment with flecainide  50 mg twice daily but is not receiving a beta-blocker (due to orthostatic  hypotension).  His device is a Medtronic Evera XT device (model number DD BB 1 D1), implanted as a generator change out in 2016.  The ventricular lead is a Retail Buyer lead implanted in 2008 (model not clear).  The atrial lead is a Medtronic 5076-52 cm lead also implanted in 2008.  Cannot comment on data from his original generator which she had from 2008 until 2016, but the current device has never delivered therapy for ventricular tachycardia or ventricular fibrillation since its implantation in 2016.  He has a history of drug-resistant focal epilepsy for which he underwent a right anterior temporal lobectomy in 2008.  He underwent implantation of a vagal nerve stimulator in the right subclavian area for this in 2016.  This led to some improvement, but he has had multiple breakthrough seizures since, including intractable seizures requiring numerous episodes of hospitalization.  He also carries a diagnosis of parkinsonism and associated orthostatic hypotension. Head CT shows sequelae of previous right temporal lobectomy as well as a unchanged small chronic infarct involving the right cingulate gyrus comparison to 2020.  In August 2025 he had a new small acute/subacute infarct in the PCA distribution, left inferior optic pole.  Past Medical History:  Diagnosis Date   Abnormality of gait 12/21/2013   Atrial fibrillation (HCC)    Blind left eye    Carcinoma in situ of prostate    Cardiac arrest (HCC)    Glaucoma    Hypercholesteremia    Hyperlipidemia    Metabolic encephalopathy    Mild left ventricular systolic dysfunction    Orthostatic hypotension    Presence of other specified functional implants    PSA elevation    Right frontal lobe lesion    Seizures (HCC)    Tinea pedis    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grover)    Urinary urgency     Past Surgical History:  Procedure Laterality Date   CARDIAC DEFIBRILLATOR PLACEMENT     cataract surgery     COLONOSCOPY     CRANIOTOMY Right     right anterior temporal resection   defibrillator replaced  08/16/2014   IMPLANTATION VAGAL NERVE STIMULATOR  11/16/2014   NASAL SINUS SURGERY     TONSILLECTOMY     age 39   VASECTOMY  06/18/1983    Current Medications: No outpatient medications have been marked as taking for the 05/11/24 encounter (Video Visit) with Koltin Wehmeyer, Jerel, MD.     Allergies:   Depakote [divalproex sodium]   Social History   Socioeconomic History   Marital status: Married    Spouse name: Slater   Number of children: 3   Years of education: MBA   Highest education level: Not on file  Occupational History   Occupation: Retired   Occupation: Field Seismologist  Tobacco Use   Smoking status: Never   Smokeless tobacco: Never   Tobacco comments:    quit 1960s  Vaping Use   Vaping status: Never Used  Substance and Sexual Activity   Alcohol use: Not  Currently    Comment: former beer drinker   Drug use: No   Sexual activity: Not Currently  Other Topics Concern   Not on file  Social History Narrative   Lives Assisted Living, [facility]   Right Handed   Drinks 2-3 cups caffeine daily   Social Drivers of Health   Financial Resource Strain: Low Risk  (04/15/2018)   Overall Financial Resource Strain (CARDIA)    Difficulty of Paying Living Expenses: Not hard at all  Food Insecurity: No Food Insecurity (02/14/2024)   Hunger Vital Sign    Worried About Running Out of Food in the Last Year: Never true    Ran Out of Food in the Last Year: Never true  Transportation Needs: No Transportation Needs (02/14/2024)   PRAPARE - Administrator, Civil Service (Medical): No    Lack of Transportation (Non-Medical): No  Physical Activity: Sufficiently Active (04/15/2018)   Exercise Vital Sign    Days of Exercise per Week: 5 days    Minutes of Exercise per Session: 30 min  Stress: No Stress Concern Present (04/15/2018)   [employer] of Occupational Health - Occupational Stress Questionnaire     Feeling of Stress : Not at all  Social Connections: Moderately Isolated (02/14/2024)   Social Connection and Isolation Panel    Frequency of Communication with Friends and Family: More than three times a week    Frequency of Social Gatherings with Friends and Family: More than three times a week    Attends Religious Services: Never    Database Administrator or Organizations: No    Attends Banker Meetings: Never    Marital Status: Married     Family History: The patient's family history includes Cancer - Lung in his mother; Cancer - Prostate in his father.  ROS:   Please see the history of present illness.     All other systems reviewed and are negative.  EKGs/Labs/Other Studies Reviewed:    The following studies were reviewed today: Review of multiple imaging studies from recent hospitalizations in [place] Review of hospitalization records and notes from [facility] [place]   EKG:    EKG Interpretation Date/Time:    Ventricular Rate:    PR Interval:    QRS Duration:    QT Interval:    QTC Calculation:   R Axis:      Text Interpretation:           Recent Labs: 10/17/2023: TSH 4.44 02/15/2024: ALT 16; Magnesium 1.8 04/22/2024: BUN 15; Creatinine 0.8; Hemoglobin 12.7; Platelets 221; Potassium 4.3; Sodium 138  Recent Lipid Panel    Component Value Date/Time   CHOL 184 02/15/2024 0513   TRIG 59 02/15/2024 0513   HDL 59 02/15/2024 0513   CHOLHDL 3.1 02/15/2024 0513   VLDL 12 02/15/2024 0513   LDLCALC 113 (H) 02/15/2024 0513     Risk Assessment/Calculations:      Physical Exam:    VS:  There were no vitals taken for this visit.    Wt Readings from Last 3 Encounters:  05/03/24 174 lb 9.6 oz (79.2 kg)  03/22/24 178 lb (80.7 kg)  03/18/24 172 lb (78 kg)      ASSESSMENT:    No diagnosis found.  PLAN:    In order of problems listed above:  History of atrial flutter/tachycardia: Very low burden of what is likely atrial  tachycardia.  He had an inappropriate defibrillator discharge for atrial flutter with 1: 1 AV  conduction in 2015, by report.  Since his current defibrillator generator was implanted in 2016, have not been able to see an actual recording.  I suspect that the current episodes of paroxysmal atrial tachycardia at 171 bpm, now conducted with 2: 1 AV block may be the same arrhythmia.  I think this has low thromboembolic risk since it probably is associated with coherent mechanical atrial contraction.  He has had almost none of these episodes in the last 10 years, until just a few days ago.  His recent stroke would be in an atypical distribution for atrial fibrillation and it occurred weeks before we detected the arrhythmia.  None has been detected since 2015 when he had an appropriate defibrillator discharge.  I remain of the opinion that the risk of disastrous intracranial bleeding associated with falls is a bigger risk to him than that of thromboembolism due to atrial arrhythmia. Acquired thrombophilia:   He cannot take direct oral anticoagulants due to his seizure medicines.  The risk of warfarin related bleeding exceeds the benefit.  We have seen a very low burden of arrhythmia on his pacemaker. SSS/AV conduction disease: For the most part he has atrial paced rhythm and should be considered atrially pacemaker dependent.  I suspect that the 3.2% of the time when he does not have atrial pacing he may be having premature atrial contractions.  He does have a junctional escape rhythm but this is quite slow, less than 30 bpm.  During atrial pacing he has normal 1: 1 AV conduction, albeit with a very long AV delay of about 400 ms.  Despite this he rarely requires ventricular pacing.  During atrial tachycardia at 130-170 bpm he only conducts 2: 1, thereby having spontaneously rate controlled arrhythmia. LBBB: Seems to be due to age-related conduction system disease rather than any structural abnormalities.  Essentially  had a normal echo in October 2023. Flecainide : This has not been associated with any episodes of prolonged arrhythmia to date.  Prescribed after he developed atrial flutter with 1:1 AV conduction, but unable to use AV nodal blocking agents due to severe orthostatic hypotension.  No known clinical history of CAD or PAD, but these are definitely possible at his age.  No evidence of significant structural heart disease by echo, at this point continuing the flecainide  appears to be a reasonable decision.   Coronary artery calcifications:  Severe atheromatous calcifications in the coronary arteries described on CT. he has never had angina pectoris, intermittent claudication or any other signs of advanced vascular disease.  Not currently on lipid-lowering therapy.  Considering his age and comorbid conditions, uncertain what the lipid-lowering therapy would make much of a difference at this point. HLP: Send LDL cholesterol was only slightly higher than desirable at 113.  He does not have diabetes mellitus and has never had clinically relevant vascular problems.  He takes a very long list of medications with many potential drug interactions, for his seizures.  I do not think lipid-lowering therapy is indicated. ICD battery depletion: Normal device function.  He has never received appropriate VT/VF intervention (did have inappropriate ICD shock for atrial flutter with high ventricular rate in 2015, on his old generator).  He needs pacemaker therapy for both sinus node dysfunction and for possible future worsening AV conduction disease.  In contradistinction, I do not think he requires ICD therapies: He has never received appropriate VT therapies, he has normal left ventricular function, he is now 88 years old.  I went over these issues in  quite a bit of detail with the patient and his wife.  I told him that I recommend downgrading the ICD generator to a dual-chamber pacemaker and they both agreed.  In addition I  recommended implantation of a Tyrex antibiotic pouch at the time of generator change out.  He is at increased risk of infection since this will be the third surgery in that area and device infection could have devastating consequences since he is atrially dependent. Hyponatremia: Seems to be well compensated as long as he takes all of the prescribed salt tablets.  May be related to SIADH or chronic treatment with levetiracetam , but his seizures have been very difficult to manage, so I do not think this medication can be stopped.  Does not appear to have adrenal insufficiency (baseline cortisol level 20.5, albeit with a paradoxical drop in response to ACTH  on labs performed 01/26/2022).  He has tolerated salt loading without any signs of heart failure clinically.  Occasionally his OptiVol has been briefly out of range, but it has been stable for the most part. Seizure disorder:  He is on 3 different antiepileptic medications and has a vagal nerve stimulator.  He has had breakthrough seizures despite this.  Has history of right temporal lobectomy. Orthostatic hypotension: Blood pressure is normal.  We should tolerate even mild hypertension up to as much as 160 mmHg since he has a tendency to drop his blood pressure.  Has had numerous falls, and the volatile blood pressure is probably related to his parkinsonism syndrome, and salt wasting/hyponatremia.  Avoid perfect control of his blood pressure.  And to avoid sudden changes in position.  He may need compression stockings or reminded abdominal binder if symptoms are not well-controlled.         This procedure (ICD generator change with downgrade to dual-chamber permanent pacemaker and use of a antibiotic Aigis pouch) has been fully reviewed with the patient , informed consent has been obtained.   Medication Adjustments/Labs and Tests Ordered: Current medicines are reviewed at length with the patient today.  Concerns regarding medicines are outlined  above.  No orders of the defined types were placed in this encounter.  No orders of the defined types were placed in this encounter.   Patient Instructions     Implantable Device Instructions     Ismail Graziani Potts                      03/18/2024   You are scheduled for a ICD generator change to a Permanent transvenous pacemaker (PPM)  on Thursday, December 4 with Dr. Jerel Lyrick Worland.   1. Pre procedure Lab testing: CBC,BMP   after Nov 5 , 2025    2. Please arrive at the Main Entrance A at [facility]: [address] [place], [place] [address] on December 4 at 11:30 AM (This time is two hours before your procedure to ensure your preparation). Free valet parking service is available. You will check in at ADMITTING. The support person will be asked to wait in the waiting room.  It is OK to have someone drop you off and come back when you are ready to be discharged.         Special note: Every effort is made to have your procedure done on time. Please understand that emergencies sometimes delay  scheduled procedures.   3.  No eating or drinking after midnight prior to procedure.     4.  Medication instructions:  On the morning of your procedure take only Flecainide *and seizure medications.   5.  The night before your procedure and the morning of your procedure scrub your neck/chest with CHG surgical scrub.  See instruction letter.   6. Plan to go home the same day, you will only stay overnight if medically necessary. 7.  You MUST have a responsible adult to drive you home. 8.   An adult MUST be with you the first 24 hours after you arrive home. 9..  Bring a current list of your medications, and the last time and date medication taken. 10. Bring ID and current insurance cards. 11. .Please wear clothes that are easy to get on and off and wear slip-on shoes.    You will follow up with the [facility] Device clinic 10-14 days after your procedure.  You will follow up with Dr.  Jerel Naome Brigandi 91 days after your procedure.  These appointments will be made for you.   * If you have ANY questions after you get home, please call the office at [phone] or send a MyChart message.   FYI: For your safety, and to allow us  to monitor your vital signs accurately during the surgery/procedure we request that if you have artificial nails, gel coating, SNS etc. Please have those removed prior to your surgery/procedure. Not having the nail coverings /polish removed may result in cancellation or delay of your surgery/procedure.    [facility] - Preparing For Surgery     Before surgery, you can play an important role. Because skin is not sterile, your skin needs to be as free of germs as possible. You can reduce the number of germs on your skin by washing with CHG (chlorahexidine gluconate) Soap before surgery.  CHG is an antiseptic cleaner which kills germs and bonds with the skin to continue killing germs even after washing.   Please do not use if you have an allergy to CHG or antibacterial soaps.  If your skin becomes reddened/irritated stop using the CHG.   Do not shave (including legs and underarms) for at least 48 hours prior to first CHG shower.  It is OK to shave your face.   Please follow these instructions carefully:             1.  Shower the night before surgery and the morning of surgery with CHG.             2.  If you choose to wash your hair, wash your hair first as usual with your normal shampoo.             3.  After you shampoo, rinse your hair and body thoroughly to remove the shampoo.             4.  Use CHG as you would any other liquid soap.  You can apply CHG directly to the skin and wash gently with a clean washcloth. 5.  Apply the CHG Soap to your body ONLY FROM THE NECK DOWN.  Do not use on open wounds or open sores.  Avoid contact with your eyes, ears, mouth and genitals (private parts).  Wash genitals (private parts) with your normal soap.             6.   Wash thoroughly, paying special attention to the area where your surgery will be performed.             7.  Thoroughly rinse your body with warm water from  the neck down.              8.  DO NOT shower/wash with your normal soap after using and rinsing off the CHG soap.             9.  Pat yourself dry with a clean towel.           10.  Wear clean pajamas.           11.  Place clean sheets on your bed the night of your first shower and do not sleep with pets.   Day of Surgery: Do not apply any deodorants/lotions.  Please wear clean clothes to the hospital/surgery center.     Signed, Jerel Balding, MD  03/18/2024 5:57 PM    [facility]   Signed, Jerel Balding, MD  05/11/2024 1:04 PM    [facility]

## 2024-05-11 NOTE — Progress Notes (Signed)
 A copy of the instructions for the Pacemaker Generator Change was sent to the patient's MyChart.

## 2024-05-20 ENCOUNTER — Encounter (HOSPITAL_COMMUNITY): Admission: RE | Disposition: A | Payer: Self-pay | Source: Home / Self Care | Attending: Cardiovascular Disease

## 2024-05-20 ENCOUNTER — Ambulatory Visit (HOSPITAL_COMMUNITY)
Admission: RE | Admit: 2024-05-20 | Discharge: 2024-05-20 | Disposition: A | Attending: Cardiovascular Disease | Admitting: Cardiovascular Disease

## 2024-05-20 ENCOUNTER — Telehealth: Payer: Self-pay | Admitting: Cardiovascular Disease

## 2024-05-20 ENCOUNTER — Other Ambulatory Visit: Payer: Self-pay

## 2024-05-20 DIAGNOSIS — G20C Parkinsonism, unspecified: Secondary | ICD-10-CM | POA: Diagnosis not present

## 2024-05-20 DIAGNOSIS — I4892 Unspecified atrial flutter: Secondary | ICD-10-CM | POA: Diagnosis not present

## 2024-05-20 DIAGNOSIS — D6859 Other primary thrombophilia: Secondary | ICD-10-CM | POA: Diagnosis not present

## 2024-05-20 DIAGNOSIS — Z79899 Other long term (current) drug therapy: Secondary | ICD-10-CM | POA: Diagnosis not present

## 2024-05-20 DIAGNOSIS — Z4502 Encounter for adjustment and management of automatic implantable cardiac defibrillator: Secondary | ICD-10-CM | POA: Diagnosis present

## 2024-05-20 DIAGNOSIS — E785 Hyperlipidemia, unspecified: Secondary | ICD-10-CM | POA: Diagnosis not present

## 2024-05-20 DIAGNOSIS — Z9689 Presence of other specified functional implants: Secondary | ICD-10-CM | POA: Diagnosis not present

## 2024-05-20 DIAGNOSIS — I495 Sick sinus syndrome: Secondary | ICD-10-CM | POA: Diagnosis not present

## 2024-05-20 DIAGNOSIS — G40119 Localization-related (focal) (partial) symptomatic epilepsy and epileptic syndromes with simple partial seizures, intractable, without status epilepticus: Secondary | ICD-10-CM | POA: Diagnosis not present

## 2024-05-20 DIAGNOSIS — I447 Left bundle-branch block, unspecified: Secondary | ICD-10-CM | POA: Diagnosis not present

## 2024-05-20 DIAGNOSIS — I951 Orthostatic hypotension: Secondary | ICD-10-CM | POA: Diagnosis not present

## 2024-05-20 DIAGNOSIS — Z8673 Personal history of transient ischemic attack (TIA), and cerebral infarction without residual deficits: Secondary | ICD-10-CM | POA: Diagnosis not present

## 2024-05-20 DIAGNOSIS — Z8674 Personal history of sudden cardiac arrest: Secondary | ICD-10-CM | POA: Diagnosis not present

## 2024-05-20 HISTORY — PX: PACEMAKER IMPLANT: EP1218

## 2024-05-20 SURGERY — PACEMAKER IMPLANT

## 2024-05-20 MED ORDER — CEFAZOLIN SODIUM-DEXTROSE 2-4 GM/100ML-% IV SOLN
2.0000 g | INTRAVENOUS | Status: AC
  Administered 2024-05-20: 2 g via INTRAVENOUS

## 2024-05-20 MED ORDER — LIDOCAINE HCL (PF) 1 % IJ SOLN
INTRAMUSCULAR | Status: AC
  Filled 2024-05-20: qty 60

## 2024-05-20 MED ORDER — CHLORHEXIDINE GLUCONATE 4 % EX SOLN
4.0000 | Freq: Once | CUTANEOUS | Status: DC
  Filled 2024-05-20: qty 60

## 2024-05-20 MED ORDER — POVIDONE-IODINE 10 % EX SWAB: 2.0000 | Freq: Once | CUTANEOUS | Status: DC

## 2024-05-20 MED ORDER — ONDANSETRON HCL 4 MG/2ML IJ SOLN: 4.0000 mg | Freq: Four times a day (QID) | INTRAMUSCULAR | Status: DC | PRN

## 2024-05-20 MED ORDER — SODIUM CHLORIDE 0.9 % IV SOLN: INTRAVENOUS | Status: DC

## 2024-05-20 MED ORDER — SODIUM CHLORIDE 0.9% FLUSH: 3.0000 mL | INTRAVENOUS | Status: DC | PRN

## 2024-05-20 MED ORDER — SODIUM CHLORIDE 0.9 % IV SOLN: INTRAVENOUS | Status: DC | PRN

## 2024-05-20 MED ORDER — CEFAZOLIN SODIUM-DEXTROSE 2-4 GM/100ML-% IV SOLN
INTRAVENOUS | Status: DC
  Filled 2024-05-20: qty 100

## 2024-05-20 MED ORDER — SODIUM CHLORIDE 0.9 % IV SOLN
INTRAVENOUS | Status: DC
  Filled 2024-05-20: qty 2

## 2024-05-20 MED ORDER — SODIUM CHLORIDE 0.9 % IV SOLN
80.0000 mg | INTRAVENOUS | Status: AC
  Administered 2024-05-20: 80 mg

## 2024-05-20 MED ORDER — SODIUM CHLORIDE 0.9% FLUSH: 3.0000 mL | Freq: Two times a day (BID) | INTRAVENOUS | Status: DC

## 2024-05-20 MED ORDER — ACETAMINOPHEN 325 MG PO TABS: 325.0000 mg | ORAL_TABLET | ORAL | Status: DC | PRN

## 2024-05-20 MED ORDER — LIDOCAINE HCL (PF) 1 % IJ SOLN
INTRAMUSCULAR | Status: DC | PRN
  Administered 2024-05-20: 60 mL

## 2024-05-20 SURGICAL SUPPLY — 6 items
CABLE SURGICAL S-101-97-12 (CABLE) ×1 IMPLANT
IPG PACE AZUR XT DR MRI W1DR01 (Pacemaker) IMPLANT
KIT LEAD END CAP (Cap) IMPLANT
PAD DEFIB RADIO PHYSIO CONN (PAD) ×1 IMPLANT
POUCH AIGIS-R ANTIBACT PPM MED (Mesh General) IMPLANT
TRAY PACEMAKER INSERTION (PACKS) ×1 IMPLANT

## 2024-05-20 NOTE — Interval H&P Note (Signed)
 History and Physical Interval Note:  05/20/2024 1:41 PM  Cameron Potts  has presented today for surgery, with the diagnosis of eri.  The various methods of treatment have been discussed with the patient and family. After consideration of risks, benefits and other options for treatment, the patient has consented to  Procedure(s): PACEMAKER IMPLANT (N/A) as a surgical intervention.  The patient's history has been reviewed, patient examined, no change in status, stable for surgery.  I have reviewed the patient's chart and labs.  Questions were answered to the patient's satisfaction.     Kinsey Karch

## 2024-05-20 NOTE — Telephone Encounter (Signed)
 Facility reporting pt ate a fried egg and biscuit this morning around 8:30 am, along with some coffee. Reviewed with Dr. Tyrone nurse.  Facility made aware ok to proceed with procedure later today, but advised pt should be NPO going forward, may have sips of water if needed.

## 2024-05-20 NOTE — Progress Notes (Signed)
 Pt and wife received discharge instructions, teach back performed. Left chest site is clean dry intact. No signs of bleeding. IV's removed, no complications. Pt escorted out via wheelchair to wife's vehicle.

## 2024-05-20 NOTE — Telephone Encounter (Signed)
 Caller (Cameron Potts) stated patient ate a light breakfast this morning and wants a call back regarding if this will affect patient having procedure today.

## 2024-05-20 NOTE — Op Note (Signed)
 Procedure report  Procedure performed:  1. Dual chamber pacemaker generator changeout (downgrade from dual chamber ICD) 2. TyRx pouch Reason for procedure:  1. Device generator at elective replacement interval  2. Sinus node dysfunction Procedure performed by:  Jerel Balding, MD  Complications:  None  Estimated blood loss:  <5 mL  Medications administered during procedure:  Ancef 2 g intravenously, lidocaine  1% 30 mL locally Device details:   New Generator Medtronic Azure XT DR model number [id number], serial number [id number] Right atrial lead (chronic) Medtronic , model number [id number], serial number [id number] (implanted 08/25/2006) Right ventricular lead (chronic) Saint Jude ICD lead, model number H8117096, serial number [id number] (implanted 08/25/2006) TYRX pouch reference number [id number], Lot number [id number]  Explanted generator Medtronic Evera dual-chamber ICD  Procedure details:  After the risks and benefits of the procedure were discussed the patient provided informed consent. She was brought to the cardiac catheter lab in the fasting state. The patient was prepped and draped in usual sterile fashion. Local anesthesia with 1% lidocaine  was administered to to the left infraclavicular area. A 5-6cm horizontal incision was made parallel with and 2-3 cm caudal to the left clavicle, in the area of an old scar.  Using minimal electrocautery and mostly sharp and blunt dissection the prepectoral pocket was opened carefully to avoid injury to the loops of chronic leads. Extensive dissection was not necessary, but the capsule was calcified. The device was explanted. The pocket was carefully inspected for hemostasis and flushed with copious amounts of antibiotic solution.  The leads were disconnected from the old generator and connected to the new generator with appropriate pacing noted.  The high-voltage pin was capped.  Telemetry testing of atrial and ventricular lead parameters showed stable values  unchanged from preprocedure.  The entire system was then carefully inserted in the Tyrex pouch and then placed in the pocket with care been taking that the leads and device assumed a comfortable position without pressure on the incision. Great care was taken that the leads be located deep to the generator. The pocket was then closed in layers using 2 layers of 2-0 Vicryl, 1 layer of 3-0 Vicryl and cutaneous Steri-Strips after which a sterile dressing was applied.   At the end of the procedure the following lead parameters were encountered:   Right atrial lead sensed P waves 1.9 mV, impedance 399 ohms, threshold 0.75 at 0.4 ms pulse width.  Right ventricular lead sensed R waves 5.9 mV, impedance 532 ohms, threshold 1.5 at 0.4 ms pulse width.   Jerel Balding, MD, [facility] [phone] office [phone] pager

## 2024-05-20 NOTE — Discharge Instructions (Addendum)

## 2024-05-21 ENCOUNTER — Encounter (HOSPITAL_COMMUNITY): Payer: Self-pay | Admitting: Cardiovascular Disease

## 2024-05-21 ENCOUNTER — Other Ambulatory Visit: Payer: Self-pay | Admitting: Adult Health

## 2024-05-21 DIAGNOSIS — G40919 Epilepsy, unspecified, intractable, without status epilepticus: Secondary | ICD-10-CM

## 2024-05-21 MED ORDER — LACOSAMIDE 200 MG PO TABS: 200.0000 mg | ORAL_TABLET | Freq: Two times a day (BID) | ORAL | 5 refills | Status: AC

## 2024-05-25 DIAGNOSIS — G40919 Epilepsy, unspecified, intractable, without status epilepticus: Secondary | ICD-10-CM | POA: Diagnosis not present

## 2024-05-25 DIAGNOSIS — G40211 Localization-related (focal) (partial) symptomatic epilepsy and epileptic syndromes with complex partial seizures, intractable, with status epilepticus: Secondary | ICD-10-CM | POA: Diagnosis not present

## 2024-05-25 DIAGNOSIS — E871 Hypo-osmolality and hyponatremia: Secondary | ICD-10-CM | POA: Diagnosis not present

## 2024-06-02 ENCOUNTER — Ambulatory Visit

## 2024-06-03 ENCOUNTER — Telehealth: Payer: Self-pay | Admitting: *Deleted

## 2024-06-03 ENCOUNTER — Ambulatory Visit

## 2024-06-03 ENCOUNTER — Encounter

## 2024-06-03 ENCOUNTER — Telehealth: Payer: Self-pay

## 2024-06-03 DIAGNOSIS — I495 Sick sinus syndrome: Secondary | ICD-10-CM | POA: Diagnosis present

## 2024-06-03 LAB — CUP PACEART INCLINIC DEVICE CHECK
Date Time Interrogation Session: 20251218121639
Implantable Lead Connection Status: 753985
Implantable Lead Connection Status: 753985
Implantable Lead Implant Date: 20160323
Implantable Lead Implant Date: 20160323
Implantable Lead Location: 753859
Implantable Lead Location: 753860
Implantable Lead Model: 1582
Implantable Lead Model: 5076
Implantable Pulse Generator Implant Date: 20251204

## 2024-06-03 NOTE — Progress Notes (Signed)
 Pt historically atrially dependent.  Presenting rhythm atrial tachycardia at 100 bpm.  Discussed with Dr. Francyne.  Will schedule remote check in one week to reevaluate rhythm. Normal dual chamber pacemaker wound check. Presenting rhythm: AT/VP 100 . Wound well healed. Routine testing of thresholds, sensing, and impedance demonstrate stable parameters and no programming changes needed at this time. Estimated longevity 11.5 years. Pt enrolled in remote follow-up.

## 2024-06-03 NOTE — Patient Instructions (Signed)

## 2024-06-03 NOTE — Telephone Encounter (Signed)
 Called Cameron Potts back from [facility] and she states pt had a recent level drawn this week with increased dose and level was 14.7.   I asked Cameron Potts to fax a copy of recent lab to [phone] so I can relay to Dr.Camara.

## 2024-06-03 NOTE — Telephone Encounter (Signed)
 Kim from [facility] called to request to speak to Nurse or  MD ABOUT pt medication changed. Kim stated they received a letter  and Facility would like to speak to some one about these changed just to get clarity .  Callback number is [phone]

## 2024-06-03 NOTE — Telephone Encounter (Signed)
 Left message for Pt's wife per family request.  Advised per Dr. Aldine recheck remote transmission in 1 week and reevaluate presenting rhythm.

## 2024-06-03 NOTE — Telephone Encounter (Signed)
 Dr.Camara see updated level recheck from this week was done on 06/01/24 it was 14.7. This recheck is with the additional dose taken. Please advise

## 2024-06-03 NOTE — Telephone Encounter (Signed)
° °  Order faxed to [facility] to [phone].

## 2024-06-04 NOTE — Telephone Encounter (Signed)
Attempted to contact patient. No answer, left message to call back

## 2024-06-04 NOTE — Telephone Encounter (Signed)
 Spoke with Pt's wife.  Confirmed plan of care for husband.

## 2024-06-07 ENCOUNTER — Encounter: Payer: Self-pay | Admitting: Neurology

## 2024-06-07 NOTE — Telephone Encounter (Signed)
 Done. Thanks.

## 2024-06-07 NOTE — Telephone Encounter (Signed)
 Order (letter ) faxed to [phone]. Confirmation received.

## 2024-06-07 NOTE — Telephone Encounter (Signed)
 Thank you. Noted.

## 2024-06-07 NOTE — Telephone Encounter (Addendum)
 Called [facility] and spoke with nurse Crystal ETTER Cramp was not there today) and informed her of the below.  Crystal states they need and updated order stating patient will be taking 100/200.  Dr.Camara Crystal said it can be typed on letter head. Do you mind typing order on letter and I can fax to [facility].

## 2024-06-07 NOTE — Telephone Encounter (Signed)
 Dr.Camara I just want to confirm, does patient still need to take the additional 100 mg dose of Dilantin  every other day as you mention before?  Please advise

## 2024-06-07 NOTE — Telephone Encounter (Signed)
 They can go back to 100/200.

## 2024-06-09 ENCOUNTER — Ambulatory Visit

## 2024-06-23 ENCOUNTER — Telehealth: Payer: Self-pay

## 2024-06-23 NOTE — Telephone Encounter (Signed)
 Recheck of presenting rhythm assessed.   Pt AP/VP.  No action needed.  Will continue to monitor.

## 2024-07-01 ENCOUNTER — Ambulatory Visit

## 2024-07-03 ENCOUNTER — Ambulatory Visit

## 2024-07-05 ENCOUNTER — Encounter

## 2024-07-08 ENCOUNTER — Ambulatory Visit: Attending: Cardiovascular Disease

## 2024-07-08 DIAGNOSIS — I495 Sick sinus syndrome: Secondary | ICD-10-CM

## 2024-07-09 LAB — CUP PACEART REMOTE DEVICE CHECK
Battery Remaining Longevity: 133 mo
Battery Voltage: 3.17 V
Brady Statistic AP VP Percent: 2.4 %
Brady Statistic AP VS Percent: 97.47 %
Brady Statistic AS VP Percent: 0 %
Brady Statistic AS VS Percent: 0.12 %
Brady Statistic RA Percent Paced: 99.9 %
Brady Statistic RV Percent Paced: 2.4 %
Date Time Interrogation Session: 20260121213114
Implantable Lead Connection Status: 753985
Implantable Lead Connection Status: 753985
Implantable Lead Implant Date: 20160323
Implantable Lead Implant Date: 20160323
Implantable Lead Location: 753859
Implantable Lead Location: 753860
Implantable Lead Model: 1582
Implantable Lead Model: 5076
Implantable Pulse Generator Implant Date: 20251204
Lead Channel Impedance Value: 304 Ohm
Lead Channel Impedance Value: 399 Ohm
Lead Channel Impedance Value: 399 Ohm
Lead Channel Impedance Value: 513 Ohm
Lead Channel Pacing Threshold Amplitude: 0.75 V
Lead Channel Pacing Threshold Amplitude: 1.75 V
Lead Channel Pacing Threshold Pulse Width: 0.4 ms
Lead Channel Pacing Threshold Pulse Width: 0.4 ms
Lead Channel Sensing Intrinsic Amplitude: 1.375 mV
Lead Channel Sensing Intrinsic Amplitude: 1.375 mV
Lead Channel Sensing Intrinsic Amplitude: 4.625 mV
Lead Channel Sensing Intrinsic Amplitude: 4.625 mV
Lead Channel Setting Pacing Amplitude: 3.5 V
Lead Channel Setting Pacing Amplitude: 3.75 V
Lead Channel Setting Pacing Pulse Width: 0.4 ms
Lead Channel Setting Sensing Sensitivity: 1.2 mV
Zone Setting Status: 755011

## 2024-07-11 ENCOUNTER — Ambulatory Visit: Payer: Self-pay | Admitting: Cardiovascular Disease

## 2024-07-12 NOTE — Progress Notes (Signed)
 Remote PPM Transmission

## 2024-07-14 ENCOUNTER — Telehealth: Payer: Self-pay | Admitting: Neurology

## 2024-07-14 NOTE — Telephone Encounter (Signed)
 Dilantin  level done at [facility] 14.4.   Dilantin  level at goal, will keep same dose of Dilantin  100 in the morning and 200 mg in the evening  Dr. Gregg

## 2024-08-03 ENCOUNTER — Ambulatory Visit

## 2024-08-30 ENCOUNTER — Encounter: Admitting: Adult Health

## 2024-09-02 ENCOUNTER — Ambulatory Visit: Admitting: Cardiovascular Disease

## 2024-09-30 ENCOUNTER — Ambulatory Visit

## 2024-10-07 ENCOUNTER — Ambulatory Visit

## 2024-10-18 ENCOUNTER — Ambulatory Visit: Admitting: Neurology

## 2024-12-30 ENCOUNTER — Ambulatory Visit

## 2025-01-06 ENCOUNTER — Ambulatory Visit

## 2025-04-07 ENCOUNTER — Ambulatory Visit

## 2025-07-07 ENCOUNTER — Ambulatory Visit
